# Patient Record
Sex: Male | Born: 1955
Health system: Southern US, Community
[De-identification: ages and names within clinical notes are randomized; demographics above are authoritative.]

## PROBLEM LIST (undated history)

## (undated) ENCOUNTER — Ambulatory Visit (HOSPITAL_COMMUNITY): Admission: EM | Payer: Medicare PPO | Source: Home / Self Care

## (undated) DIAGNOSIS — R011 Cardiac murmur, unspecified: Secondary | ICD-10-CM

## (undated) DIAGNOSIS — I10 Essential (primary) hypertension: Secondary | ICD-10-CM

## (undated) DIAGNOSIS — C3411 Malignant neoplasm of upper lobe, right bronchus or lung: Secondary | ICD-10-CM

## (undated) DIAGNOSIS — G894 Chronic pain syndrome: Secondary | ICD-10-CM

## (undated) DIAGNOSIS — I639 Cerebral infarction, unspecified: Secondary | ICD-10-CM

## (undated) DIAGNOSIS — J45909 Unspecified asthma, uncomplicated: Secondary | ICD-10-CM

## (undated) HISTORY — PX: BUNIONECTOMY: SHX129

## (undated) HISTORY — PX: LEG SURGERY: SHX1003

---

## 2012-10-07 ENCOUNTER — Inpatient Hospital Stay (HOSPITAL_COMMUNITY)
Admission: EM | Admit: 2012-10-07 | Discharge: 2012-10-11 | DRG: 494 | Disposition: A | Discharge status: Home / Self Care | Payer: Non-veteran care | Attending: Orthopaedic Surgery | Admitting: Orthopaedic Surgery

## 2012-10-07 ENCOUNTER — Emergency Department (HOSPITAL_COMMUNITY): Payer: Non-veteran care

## 2012-10-07 ENCOUNTER — Encounter (HOSPITAL_COMMUNITY): Payer: Self-pay | Admitting: *Deleted

## 2012-10-07 DIAGNOSIS — S42002A Fracture of unspecified part of left clavicle, initial encounter for closed fracture: Secondary | ICD-10-CM

## 2012-10-07 DIAGNOSIS — S42032A Displaced fracture of lateral end of left clavicle, initial encounter for closed fracture: Secondary | ICD-10-CM

## 2012-10-07 DIAGNOSIS — G894 Chronic pain syndrome: Secondary | ICD-10-CM | POA: Insufficient documentation

## 2012-10-07 DIAGNOSIS — S82109A Unspecified fracture of upper end of unspecified tibia, initial encounter for closed fracture: Principal | ICD-10-CM | POA: Diagnosis present

## 2012-10-07 DIAGNOSIS — Y998 Other external cause status: Secondary | ICD-10-CM

## 2012-10-07 DIAGNOSIS — S82102A Unspecified fracture of upper end of left tibia, initial encounter for closed fracture: Secondary | ICD-10-CM

## 2012-10-07 DIAGNOSIS — M79609 Pain in unspecified limb: Secondary | ICD-10-CM | POA: Diagnosis present

## 2012-10-07 DIAGNOSIS — I10 Essential (primary) hypertension: Secondary | ICD-10-CM | POA: Diagnosis present

## 2012-10-07 DIAGNOSIS — S82143A Displaced bicondylar fracture of unspecified tibia, initial encounter for closed fracture: Secondary | ICD-10-CM

## 2012-10-07 DIAGNOSIS — S42009A Fracture of unspecified part of unspecified clavicle, initial encounter for closed fracture: Secondary | ICD-10-CM | POA: Diagnosis present

## 2012-10-07 DIAGNOSIS — Y92410 Unspecified street and highway as the place of occurrence of the external cause: Secondary | ICD-10-CM

## 2012-10-07 DIAGNOSIS — S82142A Displaced bicondylar fracture of left tibia, initial encounter for closed fracture: Secondary | ICD-10-CM

## 2012-10-07 DIAGNOSIS — IMO0002 Reserved for concepts with insufficient information to code with codable children: Secondary | ICD-10-CM | POA: Diagnosis present

## 2012-10-07 DIAGNOSIS — S0081XA Abrasion of other part of head, initial encounter: Secondary | ICD-10-CM

## 2012-10-07 HISTORY — DX: Essential (primary) hypertension: I10

## 2012-10-07 HISTORY — DX: Cardiac murmur, unspecified: R01.1

## 2012-10-07 HISTORY — DX: Chronic pain syndrome: G89.4

## 2012-10-07 LAB — MRSA PCR SCREENING: MRSA by PCR: NEGATIVE
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
## 2012-10-07 MED ORDER — HYDROCODONE-ACETAMINOPHEN 5-325 MG PO TABS
2.0000 | ORAL_TABLET | Freq: Once | ORAL | Status: AC
  Administered 2012-10-07: 2 via ORAL
  Filled 2012-10-07: qty 2

## 2012-10-07 MED ORDER — BACITRACIN ZINC 500 UNIT/GM EX OINT
TOPICAL_OINTMENT | Freq: Two times a day (BID) | CUTANEOUS | Status: DC
  Administered 2012-10-08 – 2012-10-10 (×4): via TOPICAL
  Administered 2012-10-11: 1 via TOPICAL
  Filled 2012-10-07 (×2): qty 15

## 2012-10-07 MED ORDER — HYDROMORPHONE HCL PF 1 MG/ML IJ SOLN
1.0000 mg | Freq: Once | INTRAMUSCULAR | Status: AC
  Administered 2012-10-07: 1 mg via INTRAVENOUS
  Filled 2012-10-07: qty 1

## 2012-10-07 MED ORDER — OXYCODONE-ACETAMINOPHEN 5-325 MG PO TABS
2.0000 | ORAL_TABLET | ORAL | Status: DC | PRN
  Administered 2012-10-08 – 2012-10-11 (×12): 2 via ORAL
  Filled 2012-10-07 (×12): qty 2

## 2012-10-07 MED ORDER — PROMETHAZINE HCL 25 MG PO TABS
25.0000 mg | ORAL_TABLET | Freq: Four times a day (QID) | ORAL | Status: DC | PRN
  Filled 2012-10-07 (×2): qty 1

## 2012-10-07 MED ORDER — ONDANSETRON HCL 4 MG/2ML IJ SOLN
4.0000 mg | Freq: Once | INTRAMUSCULAR | Status: AC
  Administered 2012-10-07: 4 mg via INTRAVENOUS
  Filled 2012-10-07: qty 2

## 2012-10-07 MED ORDER — SODIUM CHLORIDE 0.9 % IV BOLUS (SEPSIS)
1000.0000 mL | Freq: Once | INTRAVENOUS | Status: AC
  Administered 2012-10-07: 1000 mL via INTRAVENOUS

## 2012-10-07 MED ORDER — METHOCARBAMOL 500 MG PO TABS
500.0000 mg | ORAL_TABLET | Freq: Four times a day (QID) | ORAL | Status: DC | PRN
  Administered 2012-10-10 (×2): 500 mg via ORAL
  Filled 2012-10-07 (×6): qty 1

## 2012-10-07 MED ORDER — MORPHINE SULFATE 2 MG/ML IJ SOLN
2.0000 mg | INTRAMUSCULAR | Status: DC | PRN
  Administered 2012-10-08 – 2012-10-09 (×3): 2 mg via INTRAVENOUS
  Filled 2012-10-07 (×4): qty 1
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
## 2012-10-07 NOTE — ED Notes (Signed)
Ortho tech paged for order for Knee immobilizer

## 2012-10-07 NOTE — ED Notes (Signed)
Pt denies n/v/d; pt alert and mentating appropriately; pt denies blurred vision and headache; pt denies numbness and tingling; pt denies dizziness and lightheadedness; NAD noted at this time.

## 2012-10-07 NOTE — ED Notes (Signed)
MD at bedside. 

## 2012-10-07 NOTE — ED Notes (Signed)
[employer] officer at bedside speaking with pt. 
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
## 2012-10-07 NOTE — Progress Notes (Signed)
Orthopedic Tech Progress Note Patient Details:  Randy Andersen 07/24/1956 [id number]  Ortho Devices Type of Ortho Device: Knee Immobilizer Ortho Device/Splint Location: (L) LE Ortho Device/Splint Interventions: Application   Jennye Moccasin 10/07/2012, 10:31 PM
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
## 2012-10-08 ENCOUNTER — Encounter (HOSPITAL_COMMUNITY): Payer: Self-pay | Admitting: Certified Registered Nurse Anesthetist

## 2012-10-08 ENCOUNTER — Inpatient Hospital Stay (HOSPITAL_COMMUNITY): Payer: Non-veteran care

## 2012-10-08 ENCOUNTER — Inpatient Hospital Stay (HOSPITAL_COMMUNITY): Payer: Non-veteran care | Admitting: Certified Registered Nurse Anesthetist

## 2012-10-08 ENCOUNTER — Encounter (HOSPITAL_COMMUNITY)
Admission: EM | Disposition: A | Discharge status: Home / Self Care | Payer: Self-pay | Source: Home / Self Care | Attending: Orthopaedic Surgery

## 2012-10-08 HISTORY — PX: ORIF TIBIA PLATEAU: SHX2132

## 2012-10-08 LAB — CBC
HCT: 40.6 % (ref 39.0–52.0)
Hemoglobin: 14.4 g/dL (ref 13.0–17.0)
MCH: 29.6 pg (ref 26.0–34.0)
MCHC: 35.5 g/dL (ref 30.0–36.0)
MCV: 83.4 fL (ref 78.0–100.0)
Platelets: 185 10*3/uL (ref 150–400)
RBC: 4.87 MIL/uL (ref 4.22–5.81)
RDW: 13.2 % (ref 11.5–15.5)
WBC: 6.6 10*3/uL (ref 4.0–10.5)

## 2012-10-08 LAB — URINE MICROSCOPIC-ADD ON

## 2012-10-08 LAB — URINALYSIS, ROUTINE W REFLEX MICROSCOPIC
Bilirubin Urine: NEGATIVE
Glucose, UA: NEGATIVE mg/dL
Ketones, ur: NEGATIVE mg/dL
Nitrite: NEGATIVE
Protein, ur: NEGATIVE mg/dL
Specific Gravity, Urine: 1.012 (ref 1.005–1.030)
Urobilinogen, UA: 0.2 mg/dL (ref 0.0–1.0)
pH: 7 (ref 5.0–8.0)

## 2012-10-08 LAB — APTT: aPTT: 32 seconds (ref 24–37)

## 2012-10-08 LAB — COMPREHENSIVE METABOLIC PANEL
ALT: 14 U/L (ref 0–53)
AST: 24 U/L (ref 0–37)
Albumin: 3.6 g/dL (ref 3.5–5.2)
Alkaline Phosphatase: 86 U/L (ref 39–117)
BUN: 11 mg/dL (ref 6–23)
CO2: 25 mEq/L (ref 19–32)
Calcium: 9 mg/dL (ref 8.4–10.5)
Chloride: 106 mEq/L (ref 96–112)
Creatinine, Ser: 1.14 mg/dL (ref 0.50–1.35)
GFR calc Af Amer: 81 mL/min — ABNORMAL LOW (ref >90)
GFR calc non Af Amer: 70 mL/min — ABNORMAL LOW (ref >90)
Glucose, Bld: 98 mg/dL (ref 70–99)
Potassium: 3.5 mEq/L (ref 3.5–5.1)
Sodium: 140 mEq/L (ref 135–145)
Total Bilirubin: 0.6 mg/dL (ref 0.3–1.2)
Total Protein: 6.6 g/dL (ref 6.0–8.3)

## 2012-10-08 LAB — PROTIME-INR
INR: 1.01 (ref 0.00–1.49)
Prothrombin Time: 13.2 seconds (ref 11.6–15.2)

## 2012-10-08 IMAGING — RF DG TIBIA/FIBULA 2V*L*
1 series · 3 of 3 positions shown · non-contrast
Comparison: CT radiographs dated [DATE].

CLINICAL DATA: Tibial plateau fracture.

LEFT TIBIA AND FIBULA - 2 VIEW

[Series 1: run · 3 of 3 slices shown]
[im 1/3]
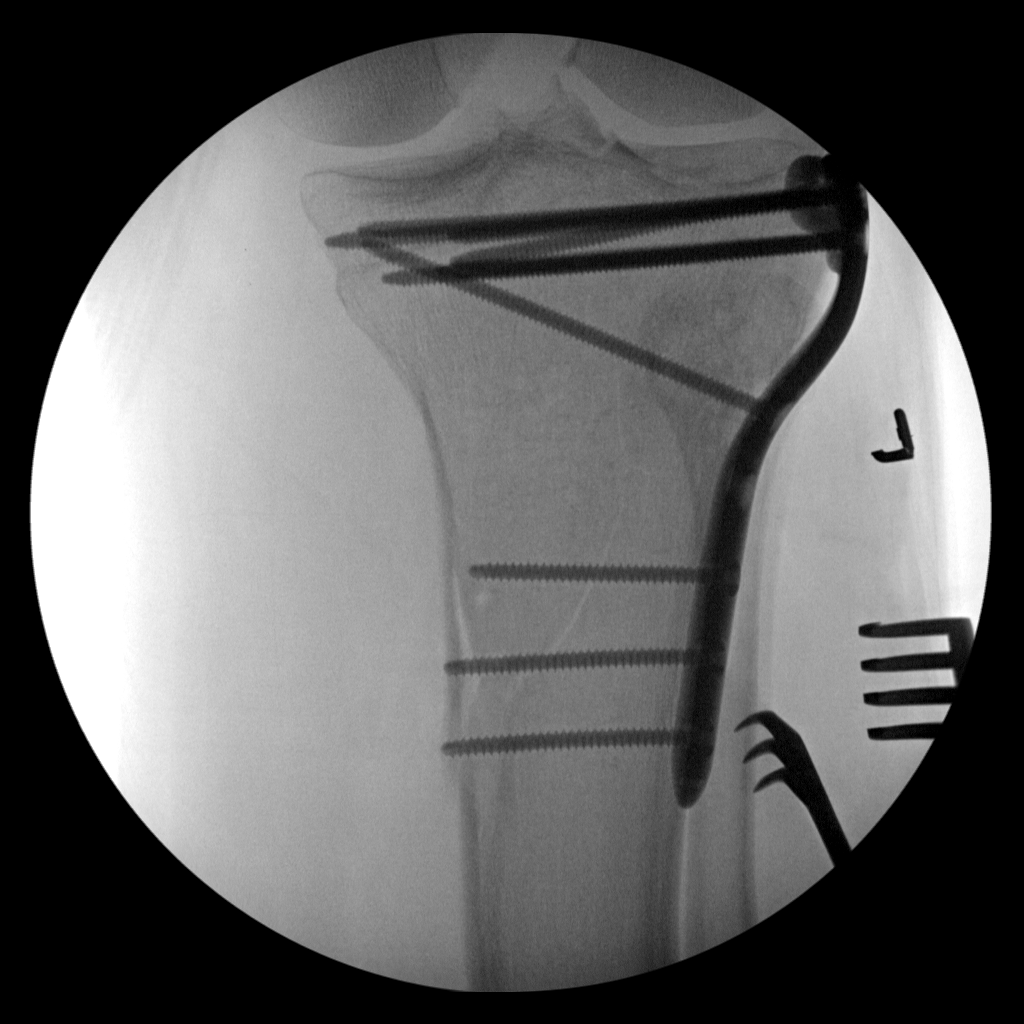
[im 2/3]
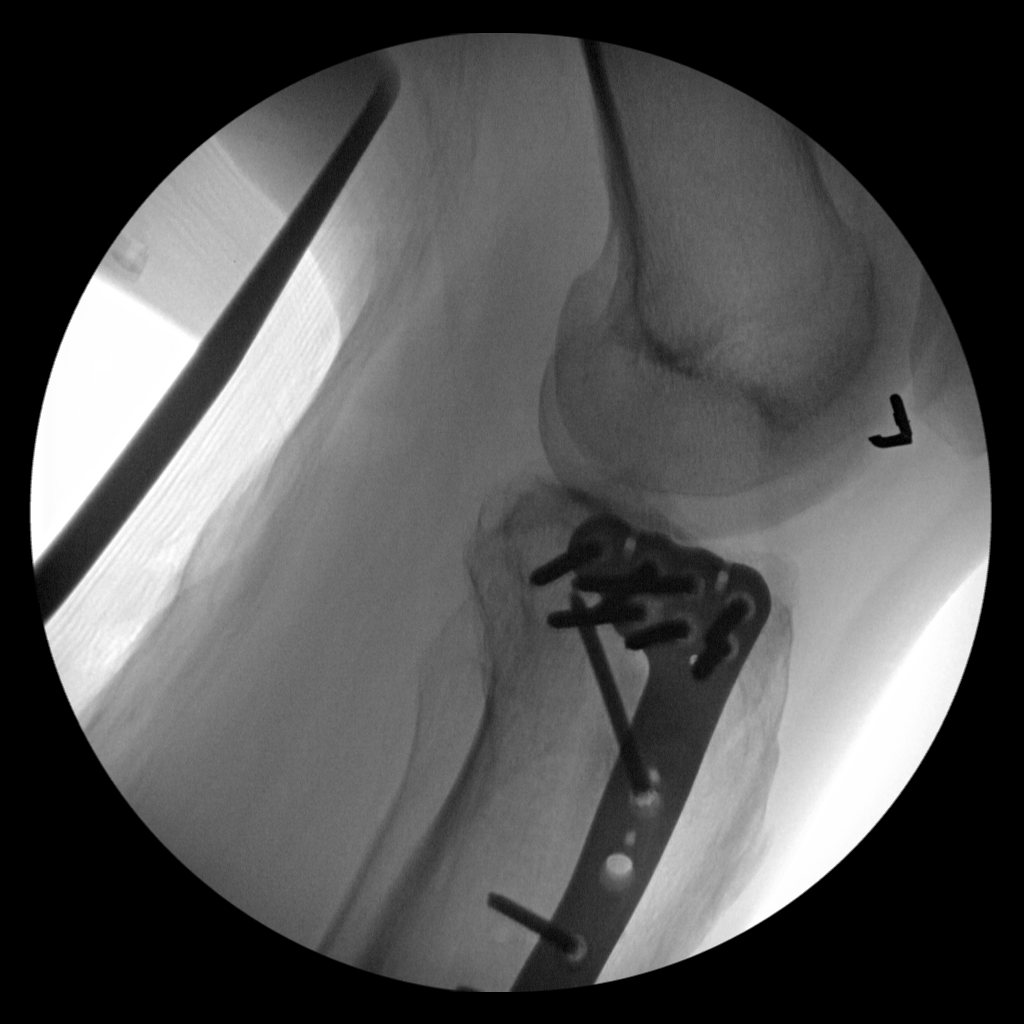
[im 3/3]
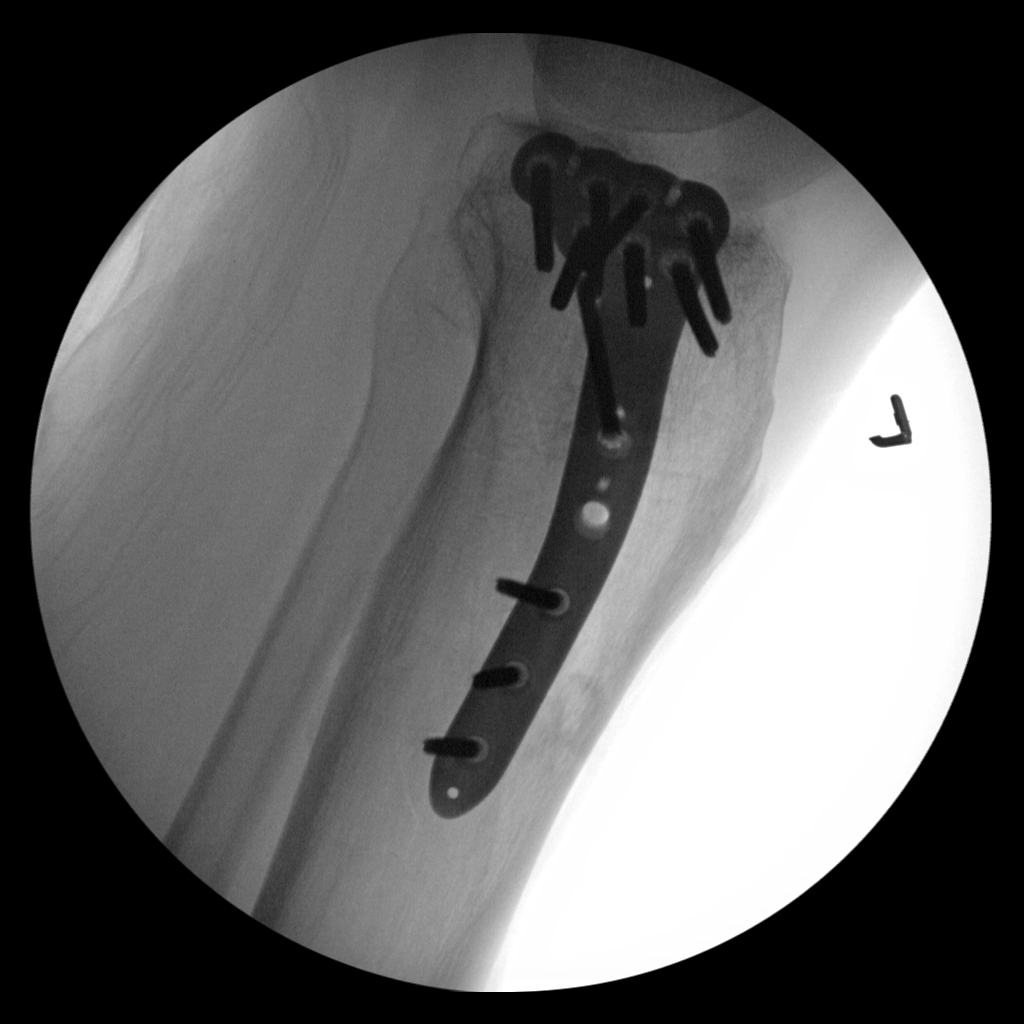

[3 of 3 positions shown; findings below may reference images not displayed]

FINDINGS: Three C-arm views of the left knee demonstrate screw and
plate fixation of the previously demonstrated comminuted proximal
tibia fracture.  Anatomic position and alignment of the major
fragments.  Mild displacement at the base of the lateral tibial
spine.
IMPRESSION: Hardware fixation of the proximal tibia fracture, as described
above.

## 2012-10-08 IMAGING — CR DG TIBIA/FIBULA PORT 2V*L*
4 series · 4 of 4 positions shown · non-contrast
Comparison: Multiple priors.

CLINICAL DATA: Postop

PORTABLE LEFT TIBIA AND FIBULA - 2 VIEW

[AP (1 of 2)]
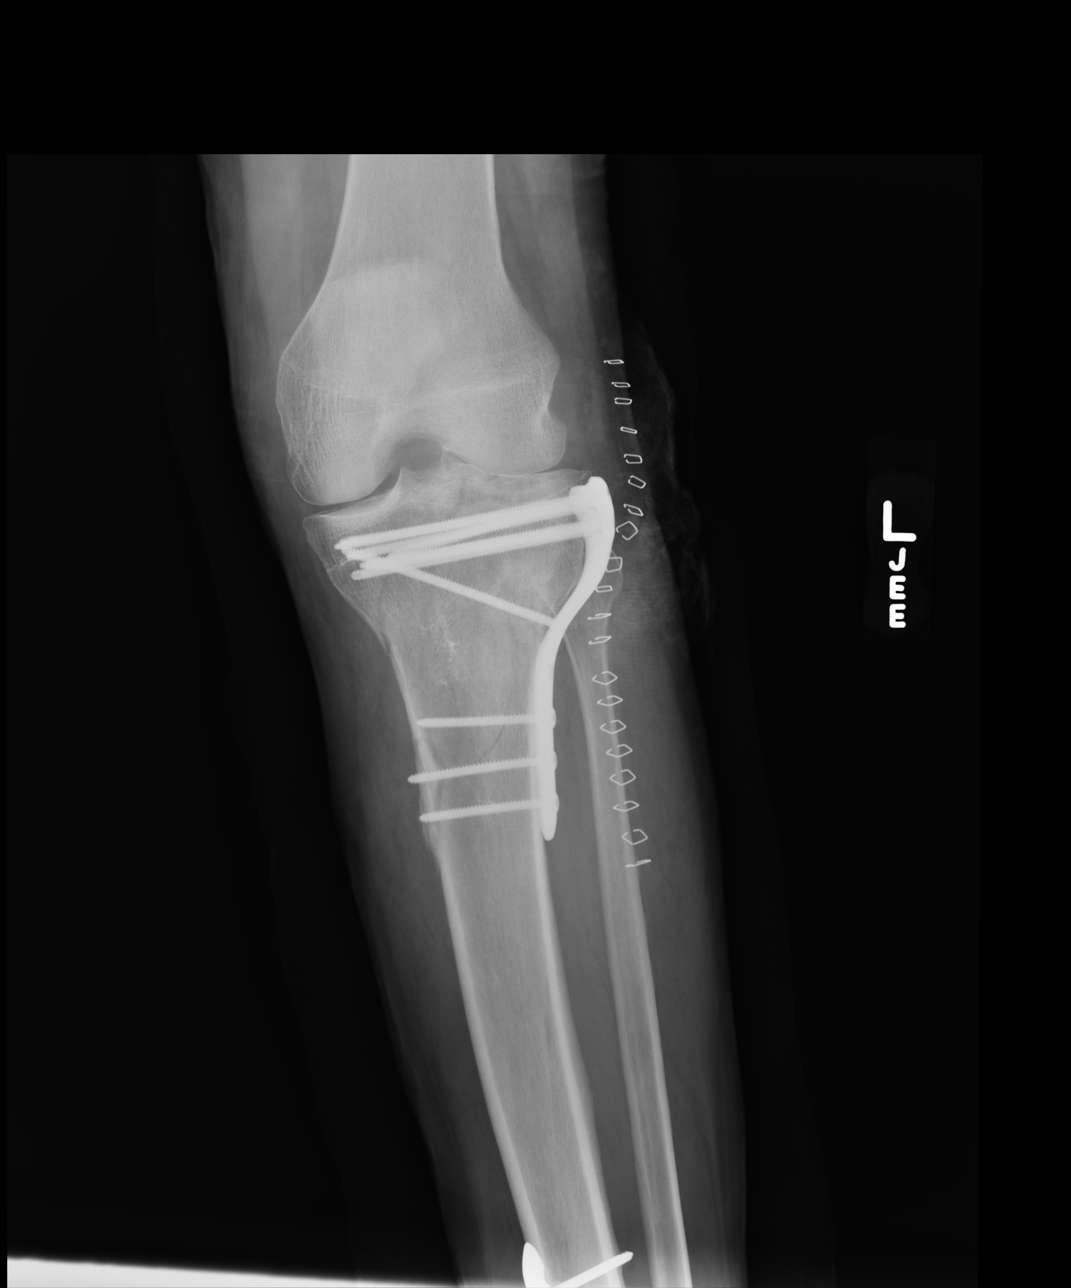

[lateral (1 of 2)]
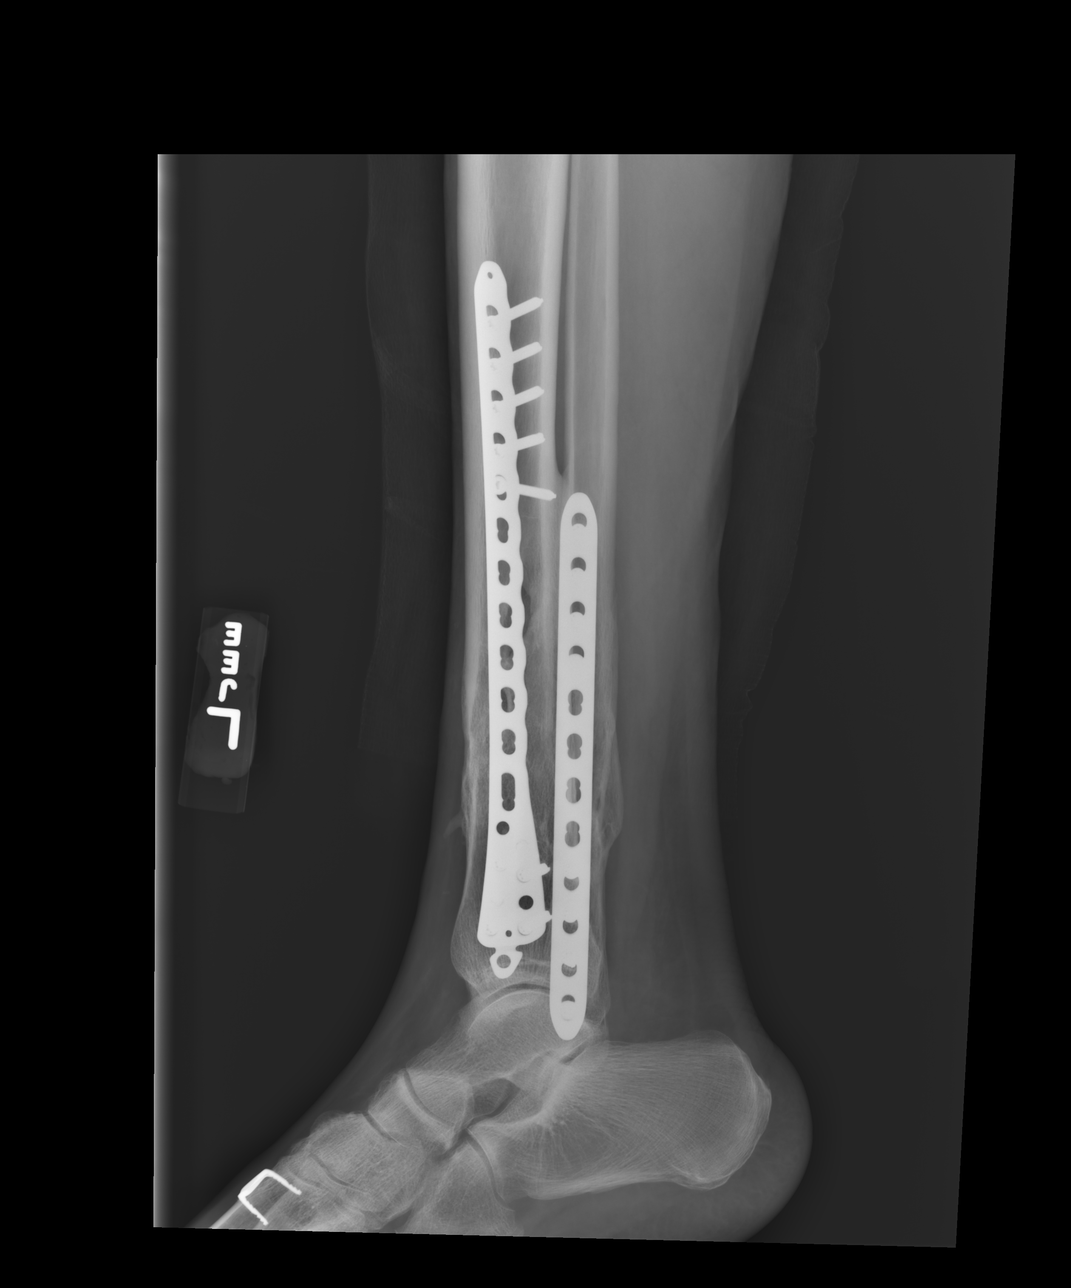

[AP (2 of 2)]
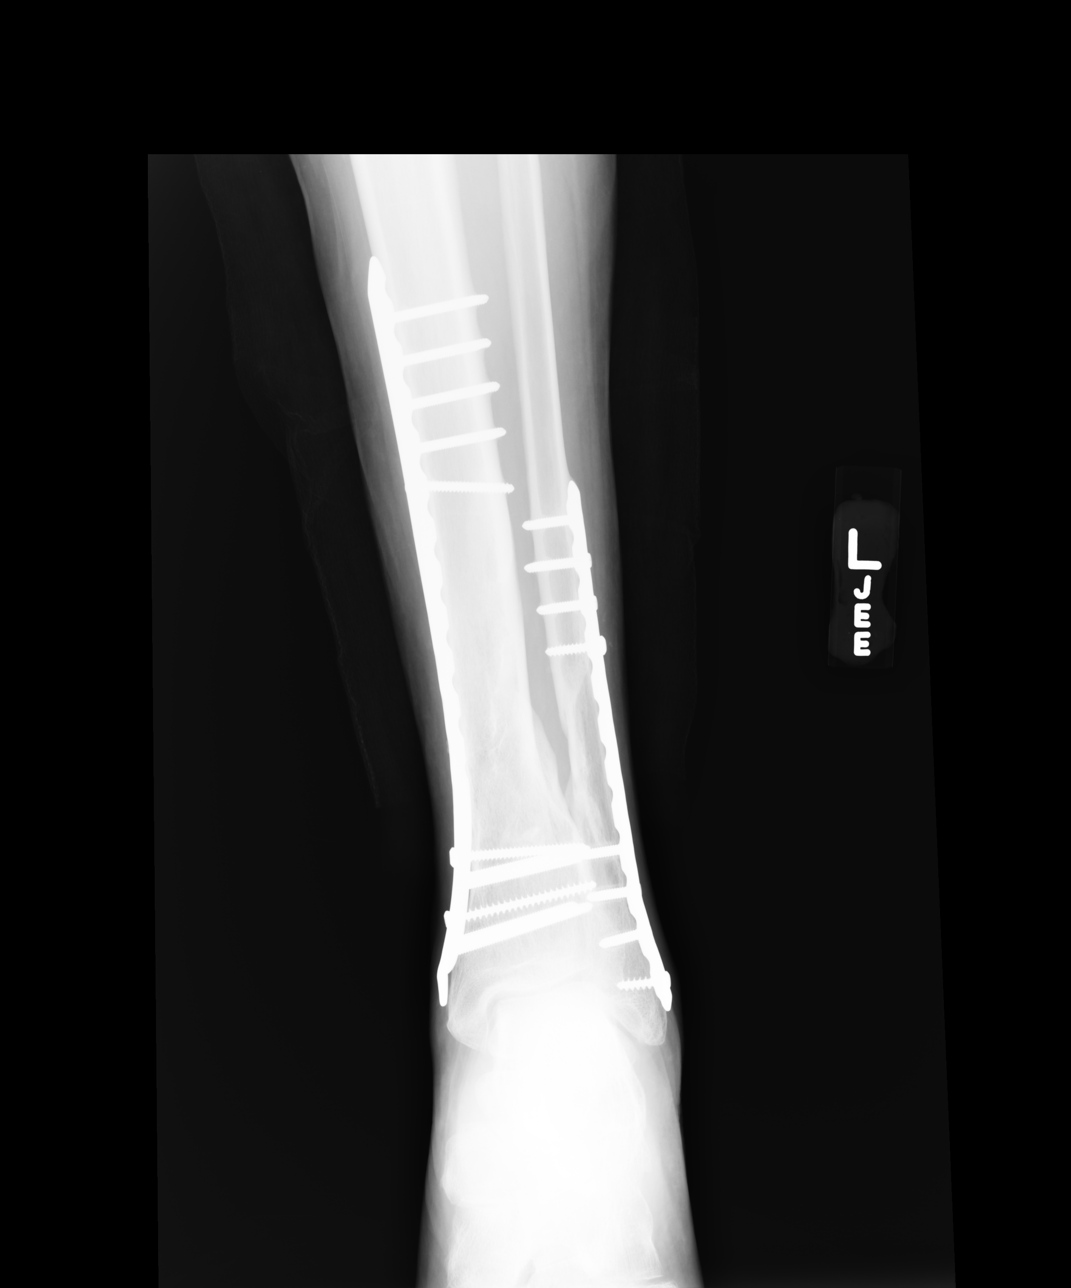

[lateral (2 of 2)]
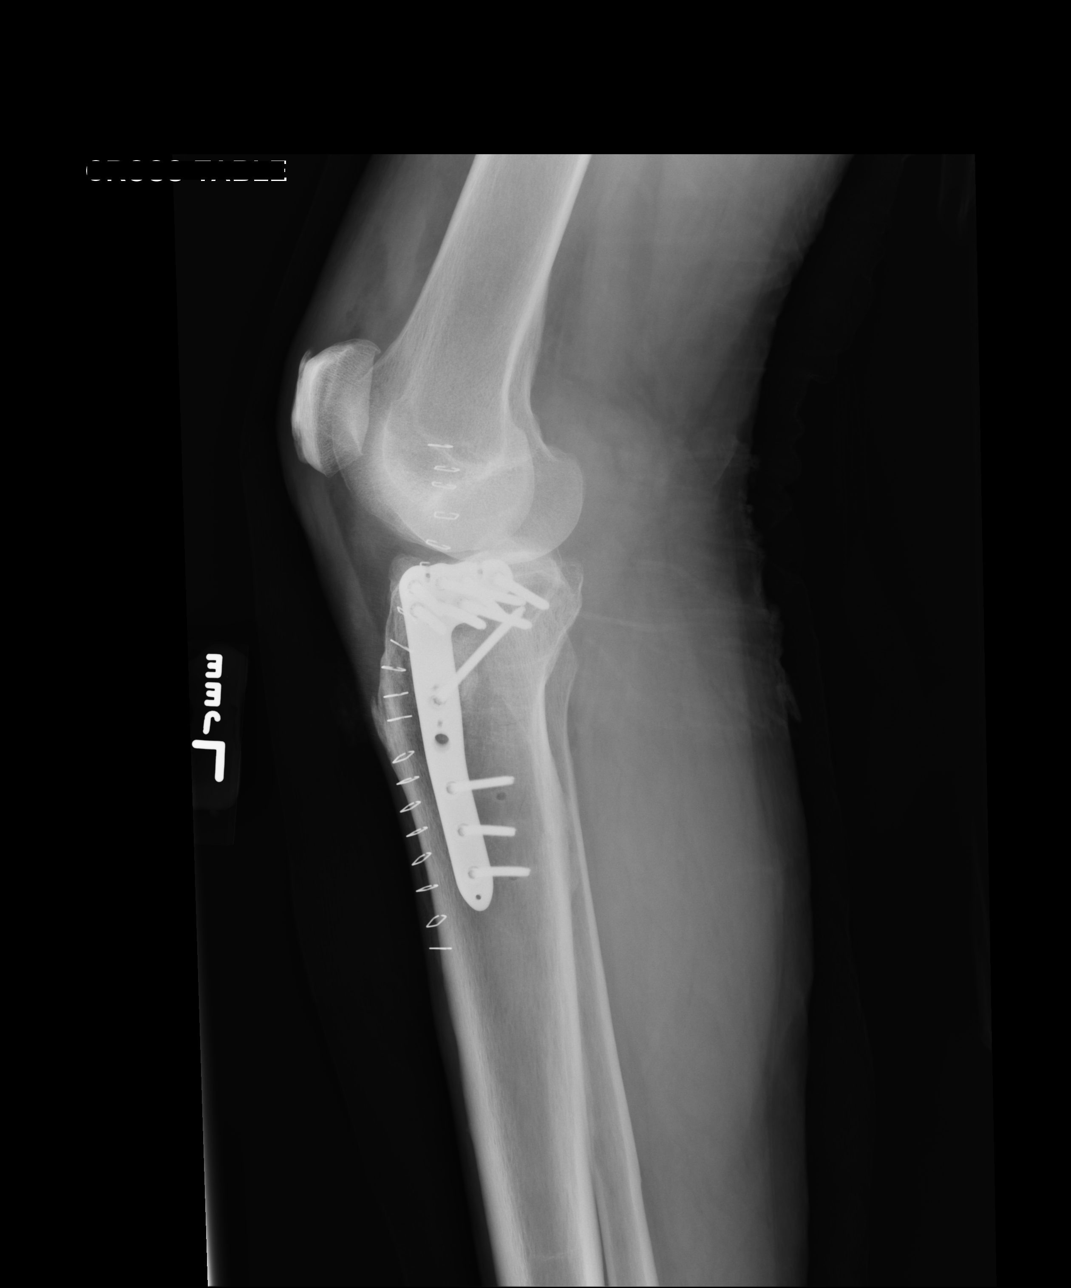

[4 of 4 positions shown; findings below may reference images not displayed]

FINDINGS: Portable AP/lateral of the tibia demonstrates open
reduction and internal fixation of lateral tibial plateau fracture.
Plate and screw fixation of lower leg tibial and fibular fractures
appear healed.
IMPRESSION: Satisfactory postoperative appearance.

## 2012-10-08 SURGERY — OPEN REDUCTION INTERNAL FIXATION (ORIF) TIBIAL PLATEAU
Anesthesia: General | Site: Leg Lower | Laterality: Left | Wound class: Clean

## 2012-10-08 MED ORDER — AMLODIPINE BESYLATE 10 MG PO TABS
10.0000 mg | ORAL_TABLET | Freq: Every day | ORAL | Status: DC
  Administered 2012-10-08 – 2012-10-11 (×4): 10 mg via ORAL
  Filled 2012-10-08 (×5): qty 1

## 2012-10-08 MED ORDER — AMLODIPINE BESYLATE 10 MG PO TABS
10.0000 mg | ORAL_TABLET | Freq: Once | ORAL | Status: AC
  Administered 2012-10-08: 10 mg via ORAL
  Filled 2012-10-08: qty 1

## 2012-10-08 MED ORDER — HYDROMORPHONE HCL PF 1 MG/ML IJ SOLN
0.2500 mg | INTRAMUSCULAR | Status: DC | PRN
  Administered 2012-10-08 (×2): 0.5 mg via INTRAVENOUS

## 2012-10-08 MED ORDER — SENNA 8.6 MG PO TABS
1.0000 | ORAL_TABLET | Freq: Two times a day (BID) | ORAL | Status: DC
  Administered 2012-10-08 – 2012-10-11 (×6): 8.6 mg via ORAL
  Filled 2012-10-08 (×8): qty 1

## 2012-10-08 MED ORDER — BUPIVACAINE HCL (PF) 0.25 % IJ SOLN
INTRAMUSCULAR | Status: DC | PRN
  Administered 2012-10-08: 20 mL

## 2012-10-08 MED ORDER — CEFAZOLIN SODIUM-DEXTROSE 2-3 GM-% IV SOLR
2.0000 g | INTRAVENOUS | Status: AC
  Administered 2012-10-08: 2 g via INTRAVENOUS
  Filled 2012-10-08: qty 50

## 2012-10-08 MED ORDER — MIDAZOLAM HCL 5 MG/5ML IJ SOLN
INTRAMUSCULAR | Status: DC | PRN
  Administered 2012-10-08: 2 mg via INTRAVENOUS

## 2012-10-08 MED ORDER — ONDANSETRON HCL 4 MG/2ML IJ SOLN: 4.0000 mg | Freq: Once | INTRAMUSCULAR | Status: DC | PRN

## 2012-10-08 MED ORDER — MEPERIDINE HCL 25 MG/ML IJ SOLN: 6.2500 mg | INTRAMUSCULAR | Status: DC | PRN

## 2012-10-08 MED ORDER — HYDROCODONE-ACETAMINOPHEN 10-325 MG PO TABS
1.0000 | ORAL_TABLET | Freq: Four times a day (QID) | ORAL | Status: DC | PRN
  Administered 2012-10-08 – 2012-10-11 (×4): 1 via ORAL
  Filled 2012-10-08 (×4): qty 1

## 2012-10-08 MED ORDER — ONDANSETRON HCL 4 MG/2ML IJ SOLN
4.0000 mg | Freq: Four times a day (QID) | INTRAMUSCULAR | Status: DC | PRN
  Filled 2012-10-08: qty 2

## 2012-10-08 MED ORDER — METHOCARBAMOL 100 MG/ML IJ SOLN
500.0000 mg | Freq: Four times a day (QID) | INTRAVENOUS | Status: DC | PRN
  Filled 2012-10-08: qty 5

## 2012-10-08 MED ORDER — LACTATED RINGERS IV SOLN
INTRAVENOUS | Status: DC | PRN
  Administered 2012-10-08 (×2): via INTRAVENOUS

## 2012-10-08 MED ORDER — WARFARIN SODIUM 10 MG PO TABS
10.0000 mg | ORAL_TABLET | Freq: Once | ORAL | Status: AC
  Administered 2012-10-08: 10 mg via ORAL
  Filled 2012-10-08: qty 1

## 2012-10-08 MED ORDER — GLYCOPYRROLATE 0.2 MG/ML IJ SOLN
INTRAMUSCULAR | Status: DC | PRN
  Administered 2012-10-08: .6 mg via INTRAVENOUS

## 2012-10-08 MED ORDER — METOCLOPRAMIDE HCL 5 MG/ML IJ SOLN: 5.0000 mg | Freq: Three times a day (TID) | INTRAMUSCULAR | Status: DC | PRN

## 2012-10-08 MED ORDER — FLEET ENEMA 7-19 GM/118ML RE ENEM: 1.0000 | ENEMA | Freq: Once | RECTAL | Status: AC | PRN

## 2012-10-08 MED ORDER — OXYCODONE HCL 5 MG/5ML PO SOLN: 5.0000 mg | Freq: Once | ORAL | Status: DC | PRN

## 2012-10-08 MED ORDER — ONDANSETRON HCL 4 MG/2ML IJ SOLN
INTRAMUSCULAR | Status: DC | PRN
  Administered 2012-10-08: 4 mg via INTRAVENOUS

## 2012-10-08 MED ORDER — HYDROMORPHONE HCL PF 1 MG/ML IJ SOLN
0.5000 mg | INTRAMUSCULAR | Status: DC | PRN
  Administered 2012-10-09 – 2012-10-10 (×8): 1 mg via INTRAVENOUS
  Filled 2012-10-08 (×8): qty 1

## 2012-10-08 MED ORDER — OXYCODONE HCL 5 MG PO TABS: 5.0000 mg | ORAL_TABLET | Freq: Once | ORAL | Status: DC | PRN

## 2012-10-08 MED ORDER — FENTANYL CITRATE 0.05 MG/ML IJ SOLN
INTRAMUSCULAR | Status: DC | PRN
  Administered 2012-10-08: 100 ug via INTRAVENOUS
  Administered 2012-10-08 (×5): 50 ug via INTRAVENOUS
  Administered 2012-10-08: 100 ug via INTRAVENOUS
  Administered 2012-10-08: 50 ug via INTRAVENOUS

## 2012-10-08 MED ORDER — METHOCARBAMOL 500 MG PO TABS
500.0000 mg | ORAL_TABLET | Freq: Four times a day (QID) | ORAL | Status: DC | PRN
  Administered 2012-10-08 – 2012-10-11 (×6): 500 mg via ORAL
  Filled 2012-10-08 (×2): qty 1

## 2012-10-08 MED ORDER — CHLORHEXIDINE GLUCONATE 4 % EX LIQD
60.0000 mL | Freq: Once | CUTANEOUS | Status: AC
  Administered 2012-10-08: 4 via TOPICAL
  Filled 2012-10-08: qty 60

## 2012-10-08 MED ORDER — ROCURONIUM BROMIDE 100 MG/10ML IV SOLN
INTRAVENOUS | Status: DC | PRN
  Administered 2012-10-08: 50 mg via INTRAVENOUS

## 2012-10-08 MED ORDER — METOCLOPRAMIDE HCL 10 MG PO TABS: 5.0000 mg | ORAL_TABLET | Freq: Three times a day (TID) | ORAL | Status: DC | PRN

## 2012-10-08 MED ORDER — NEOSTIGMINE METHYLSULFATE 1 MG/ML IJ SOLN
INTRAMUSCULAR | Status: DC | PRN
  Administered 2012-10-08: 4 mg via INTRAVENOUS

## 2012-10-08 MED ORDER — WARFARIN - PHARMACIST DOSING INPATIENT: Freq: Every day | Status: DC

## 2012-10-08 MED ORDER — DEXTROSE IN LACTATED RINGERS 5 % IV SOLN
INTRAVENOUS | Status: DC
  Administered 2012-10-08 – 2012-10-10 (×3): via INTRAVENOUS

## 2012-10-08 MED ORDER — KCL IN DEXTROSE-NACL 20-5-0.9 MEQ/L-%-% IV SOLN
INTRAVENOUS | Status: DC
  Administered 2012-10-08: 07:00:00 via INTRAVENOUS
  Filled 2012-10-08 (×3): qty 1000

## 2012-10-08 MED ORDER — COUMADIN BOOK
Freq: Once | Status: AC
  Administered 2012-10-08: 22:00:00
  Filled 2012-10-08: qty 1

## 2012-10-08 MED ORDER — ONDANSETRON HCL 4 MG PO TABS: 4.0000 mg | ORAL_TABLET | Freq: Four times a day (QID) | ORAL | Status: DC | PRN

## 2012-10-08 MED ORDER — PROPOFOL 10 MG/ML IV BOLUS
INTRAVENOUS | Status: DC | PRN
  Administered 2012-10-08: 120 mg via INTRAVENOUS

## 2012-10-08 MED ORDER — LIDOCAINE HCL (CARDIAC) 20 MG/ML IV SOLN
INTRAVENOUS | Status: DC | PRN
  Administered 2012-10-08: 100 mg via INTRAVENOUS

## 2012-10-08 MED ORDER — WARFARIN VIDEO: Freq: Once | Status: DC

## 2012-10-08 SURGICAL SUPPLY — 81 items
5 Hole Proximal Tibia Plate- Large Curve ×2 IMPLANT
BANDAGE ELASTIC 4 VELCRO ST LF (GAUZE/BANDAGES/DRESSINGS) IMPLANT
BANDAGE ELASTIC 6 VELCRO ST LF (GAUZE/BANDAGES/DRESSINGS) IMPLANT
BANDAGE GAUZE ELAST BULKY 4 IN (GAUZE/BANDAGES/DRESSINGS) IMPLANT
BIT DRILL 2.5X2.75 QC CALB (BIT) ×2 IMPLANT
BIT DRILL CAL (BIT) ×1 IMPLANT
BIT DRILL SLEEVE MEASURING (BIT) ×1 IMPLANT
BLADE SURG 10 STRL SS (BLADE) ×2 IMPLANT
BLADE SURG ROTATE 9660 (MISCELLANEOUS) IMPLANT
BNDG COHESIVE 4X5 TAN STRL (GAUZE/BANDAGES/DRESSINGS) IMPLANT
BNDG COHESIVE 6X5 TAN STRL LF (GAUZE/BANDAGES/DRESSINGS) ×2 IMPLANT
BONE CHIP PRESERV 20CC (Bone Implant) ×2 IMPLANT
CLEANER TIP ELECTROSURG 2X2 (MISCELLANEOUS) ×2 IMPLANT
CLOTH BEACON ORANGE TIMEOUT ST (SAFETY) ×2 IMPLANT
COVER MAYO STAND STRL (DRAPES) ×2 IMPLANT
COVER SURGICAL LIGHT HANDLE (MISCELLANEOUS) ×2 IMPLANT
CUFF TOURNIQUET SINGLE 34IN LL (TOURNIQUET CUFF) ×2 IMPLANT
CUFF TOURNIQUET SINGLE 44IN (TOURNIQUET CUFF) IMPLANT
DRAPE C-ARM 42X72 X-RAY (DRAPES) IMPLANT
DRAPE C-ARMOR (DRAPES) ×2 IMPLANT
DRAPE INCISE IOBAN 66X45 STRL (DRAPES) ×2 IMPLANT
DRAPE U-SHAPE 47X51 STRL (DRAPES) ×2 IMPLANT
DRILL BIT CAL (BIT) ×2
DRILL SLEEVE MEASURING (BIT) ×2
DRSG ADAPTIC 3X8 NADH LF (GAUZE/BANDAGES/DRESSINGS) ×2 IMPLANT
DRSG PAD ABDOMINAL 8X10 ST (GAUZE/BANDAGES/DRESSINGS) IMPLANT
DURAPREP 26ML APPLICATOR (WOUND CARE) ×2 IMPLANT
ELECT REM PT RETURN 9FT ADLT (ELECTROSURGICAL) ×2
ELECTRODE REM PT RTRN 9FT ADLT (ELECTROSURGICAL) ×1 IMPLANT
GLOVE BIOGEL PI IND STRL 7.5 (GLOVE) ×1 IMPLANT
GLOVE BIOGEL PI IND STRL 8 (GLOVE) ×1 IMPLANT
GLOVE BIOGEL PI INDICATOR 7.5 (GLOVE) ×1
GLOVE BIOGEL PI INDICATOR 8 (GLOVE) ×1
GLOVE ECLIPSE 7.0 STRL STRAW (GLOVE) ×2 IMPLANT
GLOVE ORTHO TXT STRL SZ7.5 (GLOVE) ×2 IMPLANT
GLOVE SS BIOGEL STRL SZ 7.5 (GLOVE) ×1 IMPLANT
GLOVE SUPERSENSE BIOGEL SZ 7.5 (GLOVE) ×1
GOWN PREVENTION PLUS LG XLONG (DISPOSABLE) ×4 IMPLANT
GOWN PREVENTION PLUS XLARGE (GOWN DISPOSABLE) ×2 IMPLANT
GOWN STRL NON-REIN LRG LVL3 (GOWN DISPOSABLE) ×4 IMPLANT
IMMOBILIZER KNEE 24 THIGH 36 (MISCELLANEOUS) ×1 IMPLANT
IMMOBILIZER KNEE 24 UNIV (MISCELLANEOUS) ×2
K-WIRE ACE 1.6X6 (WIRE) ×4
KIT BASIN OR (CUSTOM PROCEDURE TRAY) ×2 IMPLANT
KIT ROOM TURNOVER OR (KITS) ×2 IMPLANT
KWIRE ACE 1.6X6 (WIRE) ×2 IMPLANT
MANIFOLD NEPTUNE II (INSTRUMENTS) ×2 IMPLANT
NEEDLE HYPO 25X1 1.5 SAFETY (NEEDLE) ×2 IMPLANT
NS IRRIG 1000ML POUR BTL (IV SOLUTION) ×2 IMPLANT
PACK ORTHO EXTREMITY (CUSTOM PROCEDURE TRAY) ×2 IMPLANT
PAD ARMBOARD 7.5X6 YLW CONV (MISCELLANEOUS) ×4 IMPLANT
PAD CAST 4YDX4 CTTN HI CHSV (CAST SUPPLIES) ×1 IMPLANT
PADDING CAST COTTON 4X4 STRL (CAST SUPPLIES) ×1
PADDING CAST COTTON 6X4 STRL (CAST SUPPLIES) IMPLANT
SCREW CORTICAL 3.5MM  44MM (Screw) ×2 IMPLANT
SCREW CORTICAL 3.5MM  46MM (Screw) ×1 IMPLANT
SCREW CORTICAL 3.5MM 44MM (Screw) ×2 IMPLANT
SCREW CORTICAL 3.5MM 46MM (Screw) ×1 IMPLANT
SCREW CORTICAL 3.5MM 70MM (Screw) ×2 IMPLANT
SCREW LOCK CORT STAR 3.5X65 (Screw) ×2 IMPLANT
SCREW LOCK CORT STAR 3.5X70 (Screw) ×8 IMPLANT
SCREW LOCK CORT STAR 3.5X75 (Screw) ×2 IMPLANT
SCREW LOCK CORT STAR 3.5X80 (Screw) ×4 IMPLANT
SPONGE GAUZE 4X4 12PLY (GAUZE/BANDAGES/DRESSINGS) ×4 IMPLANT
SPONGE LAP 18X18 X RAY DECT (DISPOSABLE) ×2 IMPLANT
STAPLER VISISTAT 35W (STAPLE) IMPLANT
STOCKINETTE IMPERVIOUS LG (DRAPES) ×2 IMPLANT
SUCTION FRAZIER TIP 10 FR DISP (SUCTIONS) ×2 IMPLANT
SUT ETHIBOND 2 0 V5 (SUTURE) IMPLANT
SUT VIC AB 0 CT1 27 (SUTURE) ×2
SUT VIC AB 0 CT1 27XBRD ANBCTR (SUTURE) ×2 IMPLANT
SUT VIC AB 1 CT1 27 (SUTURE) ×1
SUT VIC AB 1 CT1 27XBRD ANBCTR (SUTURE) ×1 IMPLANT
SUT VIC AB 2-0 CT1 27 (SUTURE)
SUT VIC AB 2-0 CT1 TAPERPNT 27 (SUTURE) IMPLANT
SYR CONTROL 10ML LL (SYRINGE) ×2 IMPLANT
TOWEL OR 17X24 6PK STRL BLUE (TOWEL DISPOSABLE) ×2 IMPLANT
TOWEL OR 17X26 10 PK STRL BLUE (TOWEL DISPOSABLE) ×2 IMPLANT
TUBE CONNECTING 12X1/4 (SUCTIONS) ×2 IMPLANT
WATER STERILE IRR 1000ML POUR (IV SOLUTION) IMPLANT
YANKAUER SUCT BULB TIP NO VENT (SUCTIONS) ×2 IMPLANT

## 2012-10-08 NOTE — Interval H&P Note (Signed)
History and Physical Interval Note:  10/08/2012 5:23 PM  Randy Andersen  has presented today for surgery, with the diagnosis of left medial/lateral tibial plateau fracture  The various methods of treatment have been discussed with the patient and family. After consideration of risks, benefits and other options for treatment, the patient has consented to  Procedure(s): OPEN REDUCTION INTERNAL FIXATION (ORIF) TIBIAL PLATEAU (Left) as a surgical intervention .  The patient's history has been reviewed, patient examined, no change in status, stable for surgery.  I have reviewed the patient's chart and labs.  Questions were answered to the patient's satisfaction.     YATES,MARK C

## 2012-10-08 NOTE — Preoperative (Signed)
Beta Blockers   Reason not to administer Beta Blockers:Not Applicable 

## 2012-10-08 NOTE — Anesthesia Postprocedure Evaluation (Signed)
Anesthesia Post Note  Patient: Randy Andersen  Procedure(s) Performed: Procedure(s) (LRB): OPEN REDUCTION INTERNAL FIXATION (ORIF) TIBIAL PLATEAU (Left)  Anesthesia type: general  Patient location: PACU  Post pain: Pain level controlled  Post assessment: Patient's Cardiovascular Status Stable  Last Vitals:  Filed Vitals:   10/08/12 2000  BP: 169/88  Pulse: 84  Temp: 37.1 C  Resp: 17    Post vital signs: Reviewed and stable  Level of consciousness: sedated  Complications: No apparent anesthesia complications

## 2012-10-08 NOTE — Anesthesia Procedure Notes (Signed)
Procedure Name: Intubation Date/Time: 10/08/2012 5:36 PM Performed by: Arlice Colt B Pre-anesthesia Checklist: Patient identified, Emergency Drugs available, Suction available, Patient being monitored and Timeout performed Patient Re-evaluated:Patient Re-evaluated prior to inductionOxygen Delivery Method: Circle system utilized Preoxygenation: Pre-oxygenation with 100% oxygen Intubation Type: IV induction Ventilation: Mask ventilation without difficulty Laryngoscope Size: Mac and 4 Grade View: Grade I Tube type: Oral Tube size: 7.5 mm Number of attempts: 1 Airway Equipment and Method: Stylet Placement Confirmation: ETT inserted through vocal cords under direct vision,  positive ETCO2 and breath sounds checked- equal and bilateral Secured at: 23 cm Tube secured with: Tape Dental Injury: Teeth and Oropharynx as per pre-operative assessment

## 2012-10-08 NOTE — Brief Op Note (Addendum)
10/07/2012 - 10/08/2012  7:04 PM  PATIENT:  Randy Andersen  57 y.o. male  PRE-OPERATIVE DIAGNOSIS:  left medial/lateral tibial plateau fracture  POST-OPERATIVE DIAGNOSIS:  left medial/lateral tibial plateau fracture  PROCEDURE:  Procedure(s): OPEN REDUCTION INTERNAL FIXATION (ORIF) TIBIAL PLATEAU (Left) calcellous bone graft  SURGEON:  Surgeon(s) and Role:    * Eldred Manges, MD - Primary  PHYSICIAN ASSISTANT:   ASSISTANTS: none   ANESTHESIA:   local and general  EBL:     BLOOD ADMINISTERED:none  DRAINS: none   LOCAL MEDICATIONS USED:  MARCAINE     SPECIMEN:  No Specimen  DISPOSITION OF SPECIMEN:  N/A  COUNTS:  YES  TOURNIQUET:  * Missing tourniquet times found for documented tourniquets in log:  [id number] *  DICTATION: .Other Dictation: Dictation Number [id number]  PLAN OF CARE: still inpatient  PATIENT DISPOSITION:  PACU - hemodynamically stable.   Delay start of Pharmacological VTE agent (>24hrs) due to surgical blood loss or risk of bleeding: yes

## 2012-10-08 NOTE — Anesthesia Preprocedure Evaluation (Addendum)
Anesthesia Evaluation  Patient identified by MRN, date of birth, ID band Patient awake    Reviewed: Allergy & Precautions, H&P , NPO status , Patient's Chart, lab work & pertinent test results  History of Anesthesia Complications Negative for: history of anesthetic complications  Airway Mallampati: I TM Distance: >3 FB Neck ROM: Full    Dental  (+) Teeth Intact and Dental Advisory Given   Pulmonary          Cardiovascular hypertension, Pt. on medications Rhythm:Regular Rate:Normal     Neuro/Psych    GI/Hepatic   Endo/Other    Renal/GU      Musculoskeletal   Abdominal   Peds  Hematology   Anesthesia Other Findings   Reproductive/Obstetrics                         Anesthesia Physical Anesthesia Plan  ASA: II  Anesthesia Plan: General   Post-op Pain Management:    Induction: Intravenous  Airway Management Planned: Oral ETT  Additional Equipment:   Intra-op Plan:   Post-operative Plan: Extubation in OR  Informed Consent: I have reviewed the patients History and Physical, chart, labs and discussed the procedure including the risks, benefits and alternatives for the proposed anesthesia with the patient or authorized representative who has indicated his/her understanding and acceptance.     Plan Discussed with: Surgeon and CRNA  Anesthesia Plan Comments:        Anesthesia Quick Evaluation
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
## 2012-10-08 NOTE — Progress Notes (Signed)
UR COMPLETED  

## 2012-10-08 NOTE — Transfer of Care (Signed)
Immediate Anesthesia Transfer of Care Note  Patient: Randy Andersen  Procedure(s) Performed: Procedure(s): OPEN REDUCTION INTERNAL FIXATION (ORIF) TIBIAL PLATEAU (Left)  Patient Location: PACU  Anesthesia Type:General  Level of Consciousness: awake, alert , oriented and patient cooperative  Airway & Oxygen Therapy: Patient Spontanous Breathing and Patient connected to nasal cannula oxygen  Post-op Assessment: Report given to PACU RN, Post -op Vital signs reviewed and stable and Patient moving all extremities X 4  Post vital signs: Reviewed and stable  Complications: No apparent anesthesia complications

## 2012-10-08 NOTE — ED Notes (Signed)
Consent signed by RN and patient in chart. Transported patient to floor with consents.

## 2012-10-09 HISTORY — PX: ORIF TIBIA PLATEAU: SHX2132

## 2012-10-09 LAB — BASIC METABOLIC PANEL
BUN: 10 mg/dL (ref 6–23)
CO2: 27 mEq/L (ref 19–32)
Calcium: 9.1 mg/dL (ref 8.4–10.5)
Chloride: 101 mEq/L (ref 96–112)
Creatinine, Ser: 1.16 mg/dL (ref 0.50–1.35)
GFR calc Af Amer: 79 mL/min — ABNORMAL LOW (ref >90)
GFR calc non Af Amer: 68 mL/min — ABNORMAL LOW (ref >90)
Glucose, Bld: 125 mg/dL — ABNORMAL HIGH (ref 70–99)
Potassium: 3.7 mEq/L (ref 3.5–5.1)
Sodium: 137 mEq/L (ref 135–145)

## 2012-10-09 LAB — PROTIME-INR
INR: 1.04 (ref 0.00–1.49)
Prothrombin Time: 13.5 seconds (ref 11.6–15.2)

## 2012-10-09 MED ORDER — WARFARIN SODIUM 10 MG PO TABS
10.0000 mg | ORAL_TABLET | Freq: Once | ORAL | Status: AC
  Administered 2012-10-09: 10 mg via ORAL
  Filled 2012-10-09: qty 1
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
## 2012-10-09 NOTE — Progress Notes (Signed)
Subjective: 1 Day Post-Op Procedure(s) (LRB): OPEN REDUCTION INTERNAL FIXATION (ORIF) TIBIAL PLATEAU (Left) Patient reports pain as moderate.  Better after IV med given this am.  Percocet didn't help thru the night.  Objective: Vital signs in last 24 hours: Temp:  [98.3 F (36.8 C)-100 F (37.8 C)] 100 F (37.8 C) (03/19 0547) Pulse Rate:  [67-99] 84 (03/19 0547) Resp:  [8-18] 18 (03/19 0547) BP: (145-203)/(83-109) 160/86 mmHg (03/19 0547) SpO2:  [95 %-100 %] 100 % (03/19 0547)  Intake/Output from previous day: 03/18 0701 - 03/19 0700 In: 1265 [I.V.:1265] Out: 825 [Urine:800; Blood:25] Intake/Output this shift:     Recent Labs  10/08/12 0515  HGB 14.4    Recent Labs  10/08/12 0515  WBC 6.6  RBC 4.87  HCT 40.6  PLT 185    Recent Labs  10/08/12 0515 10/09/12 0445  NA 140 137  K 3.5 3.7  CL 106 101  CO2 25 27  BUN 11 10  CREATININE 1.14 1.16  GLUCOSE 98 125*  CALCIUM 9.0 9.1    Recent Labs  10/08/12 0515 10/09/12 0445  INR 1.01 1.04   Left LE: Neurovascular intact Dorsiflexion/Plantar flexion intact Incision: dressing C/D/I Compartment soft Sling adjusted to the left arm, min edema at clavicle fx. Assessment/Plan: 1 Day Post-Op Procedure(s) (LRB): OPEN REDUCTION INTERNAL FIXATION (ORIF) TIBIAL PLATEAU (Left) Up with therapy today.  Disposition discussed.  Pt lives alone.  Will see how he does with PT and decide about need for inpt vs home recovery.   Sentoria Brent M 10/09/2012, 8:41 AM
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
## 2012-10-10 ENCOUNTER — Encounter (HOSPITAL_COMMUNITY): Payer: Self-pay | Admitting: General Practice

## 2012-10-10 LAB — BASIC METABOLIC PANEL
BUN: 12 mg/dL (ref 6–23)
CO2: 26 mEq/L (ref 19–32)
Calcium: 9.2 mg/dL (ref 8.4–10.5)
Chloride: 102 mEq/L (ref 96–112)
Creatinine, Ser: 1.23 mg/dL (ref 0.50–1.35)
GFR calc Af Amer: 74 mL/min — ABNORMAL LOW (ref >90)
GFR calc non Af Amer: 64 mL/min — ABNORMAL LOW (ref >90)
Glucose, Bld: 130 mg/dL — ABNORMAL HIGH (ref 70–99)
Potassium: 3.9 mEq/L (ref 3.5–5.1)
Sodium: 138 mEq/L (ref 135–145)

## 2012-10-10 LAB — HEMOGLOBIN AND HEMATOCRIT, BLOOD
HCT: 38.9 % — ABNORMAL LOW (ref 39.0–52.0)
Hemoglobin: 13.8 g/dL (ref 13.0–17.0)

## 2012-10-10 LAB — PROTIME-INR
INR: 1.51 — ABNORMAL HIGH (ref 0.00–1.49)
Prothrombin Time: 17.8 seconds — ABNORMAL HIGH (ref 11.6–15.2)

## 2012-10-10 MED ORDER — WARFARIN SODIUM 5 MG PO TABS
5.0000 mg | ORAL_TABLET | Freq: Once | ORAL | Status: AC
  Administered 2012-10-10: 5 mg via ORAL
  Filled 2012-10-10: qty 1
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
## 2012-10-10 NOTE — Progress Notes (Signed)
Occupational Therapy Treatment Patient Details Name: Randy Andersen MRN: [id number] DOB: 1956/04/24 Today's Date: 10/10/2012 Time: 4401-0272 OT Time Calculation (min): 31 min  OT Assessment / Plan / Recommendation Comments on Treatment Session Pt demonstrates good safety awareness during functional transfers.  Pt. will benefit from practice with adaptive equipment.  Pt may benefit from tub transfer bench dependent on pt being able to get Lt. LE wet during shower    Follow Up Recommendations  Home health OT    Barriers to Discharge       Equipment Recommendations  3 in 1 bedside comode;Wheelchair (measurements OT);Wheelchair cushion (measurements OT);Tub/shower bench    Recommendations for Other Services    Frequency Min 2X/week   Plan Discharge plan remains appropriate    Precautions / Restrictions Precautions Precautions: Fall Required Braces or Orthoses: Knee Immobilizer - Left Restrictions Weight Bearing Restrictions: Yes LUE Weight Bearing: Weight bearing as tolerated (but sling on Lt. UE) LLE Weight Bearing: Touchdown weight bearing Other Position/Activity Restrictions: L sling  - clavicle fracture   Pertinent Vitals/Pain     ADL  Toilet Transfer: Min Pension scheme manager Method: Surveyor, minerals: Materials engineer and Hygiene: Minimal assistance Where Assessed - Engineer, mining and Hygiene: Standing Transfers/Ambulation Related to ADLs: initially requires min guard assist for transfer, but progressed to supervision ADL Comments: Pt. demonstrates good safety awareness.  Able to set up wheelchair for transfer.  Discussed pt staying on second floor of townhoues, possibilities for bathing, and LB ADLs - may benefit from AE    OT Diagnosis:    OT Problem List:   OT Treatment Interventions:     OT Goals Acute Rehab OT Goals OT Goal Formulation: With patient Time For Goal Achievement:  10/23/12 Potential to Achieve Goals: Good ADL Goals ADL Goal: Toilet Transfer - Progress: Progressing toward goals ADL Goal: Toileting - Clothing Manipulation - Progress: Progressing toward goals Pt Will Perform Tub/Shower Transfer: with supervision;Transfer tub bench;Stand pivot transfer  Visit Information  Last OT Received On: 10/10/12 Assistance Needed: +1    Subjective Data      Prior Functioning       Cognition  Cognition Overall Cognitive Status: Appears within functional limits for tasks assessed/performed Arousal/Alertness: Awake/alert Orientation Level: Appears intact for tasks assessed Behavior During Session: Randy Andersen for tasks performed    Mobility  Bed Mobility Supine to Sit: 5: Supervision Sitting - Scoot to Edge of Bed: 5: Supervision Details for Bed Mobility Assistance: Patient able to position LLE with increased time and cues for technique with positioning Transfers Transfers: Sit to Stand;Stand to Sit Sit to Stand: 5: Supervision;With upper extremity assist;From chair/3-in-1;From bed (Rt UE assist) Stand to Sit: 5: Supervision;With upper extremity assist;To chair/3-in-1 (Rt UE assist) Details for Transfer Assistance: VCs for technique. patient able to manage NWB of LLE this session. Patient transferred x4 this session from bed>WC>BSC>WC>recliner very well and limited cues. Patient educated to be as close as possible to surfaces with transferring as he cannot hop and only pivot.     Exercises      Balance     End of Session OT - End of Session Activity Tolerance: Patient tolerated treatment well Patient left: in chair;with call bell/phone within reach Nurse Communication: Patient requests pain meds  GO     Randy Andersen, Randy Andersen 10/10/2012, 12:58 PM

## 2012-10-10 NOTE — Progress Notes (Signed)
Physical Therapy Treatment Patient Details Name: Randy Andersen MRN: [id number] DOB: 03/19/1956 Today's Date: 10/10/2012 Time: 1000-1049 PT Time Calculation (min): 49 min  PT Assessment / Plan / Recommendation Comments on Treatment Session  Patient progressing well with various transfers. Patient educated on [facility] and wheelchair parts which he did very well recalling and utilizing through session. Did not mobilize in chair however will attempt tomorrow. Patient has stairs to enter and states he has various friends that can assist with that at home. Patient is extremely motivated and eager to participate in therapy    Follow Up Recommendations  Home health PT     Does the patient have the potential to tolerate intense rehabilitation     Barriers to Discharge        Equipment Recommendations  Wheelchair (measurements PT)    Recommendations for Other Services    Frequency Min 5X/week   Plan Discharge plan remains appropriate;Frequency remains appropriate    Precautions / Restrictions Precautions Precautions: Fall Required Braces or Orthoses: Knee Immobilizer - Left Restrictions Weight Bearing Restrictions: Yes LLE Weight Bearing: Touchdown weight bearing Other Position/Activity Restrictions: L sling  - clavicle fracture   Pertinent Vitals/Pain 8/10 L Leg pain after session. RN aware    Mobility  Bed Mobility Supine to Sit: 5: Supervision Sitting - Scoot to Edge of Bed: 5: Supervision Details for Bed Mobility Assistance: Patient able to position LLE with increased time and cues for technique with positioning Transfers Sit to Stand: 5: Supervision;With upper extremity assist;With armrests Stand to Sit: 5: Supervision;With upper extremity assist;With armrests Stand Pivot Transfers: 4: Min guard;With armrests;From elevated surface Details for Transfer Assistance: VCs for technique. patient able to manage NWB of LLE this session. Patient transferred x4 this session from  bed>WC>BSC>WC>recliner very well and limited cues. Patient educated to be as close as possible to surfaces with transferring as he cannot hop and only pivot.  Ambulation/Gait Ambulation/Gait Assistance: Not tested (comment) Stairs: No Wheelchair Mobility Wheelchair Mobility: No    Exercises     PT Diagnosis:    PT Problem List:   PT Treatment Interventions:     PT Goals Acute Rehab PT Goals PT Goal: Supine/Side to Sit - Progress: Met PT Transfer Goal: Bed to Chair/Chair to Bed - Progress: Progressing toward goal PT Goal: Propel Wheelchair - Progress: Progressing toward goal  Visit Information  Assistance Needed: +1    Subjective Data      Cognition  Cognition Overall Cognitive Status: Appears within functional limits for tasks assessed/performed Arousal/Alertness: Awake/alert Orientation Level: Appears intact for tasks assessed Behavior During Session: [facility] for tasks performed    Balance     End of Session PT - End of Session Equipment Utilized During Treatment: Left knee immobilizer Activity Tolerance: Patient tolerated treatment well Patient left: in chair;with call bell/phone within reach Nurse Communication: Mobility status;Weight bearing status   GP     [name] 10/10/2012, 11:28 AM  10/10/2012 [name] PTA [phone] pager [phone] office
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
## 2012-10-10 NOTE — Progress Notes (Signed)
Subjective: 2 Days Post-Op Procedure(s) (LRB): OPEN REDUCTION INTERNAL FIXATION (ORIF) TIBIAL PLATEAU (Left) Patient reports pain as moderate.   Reports he was dizzy with out of bed yesterday.  Denies nausea. Objective: Vital signs in last 24 hours: Temp:  [97.2 F (36.2 C)-99.4 F (37.4 C)] 98.9 F (37.2 C) (03/20 0547) Pulse Rate:  [92-111] 92 (03/20 0547) Resp:  [18] 18 (03/20 0547) BP: (129-186)/(73-90) 129/73 mmHg (03/20 0939) SpO2:  [98 %-100 %] 98 % (03/20 0547) Weight:  [89.359 kg (197 lb)] 89.359 kg (197 lb) (03/19 2100)  Intake/Output from previous day: 03/19 0701 - 03/20 0700 In: 2632.5 [P.O.:510; I.V.:2122.5] Out: 1650 [Urine:1650] Intake/Output this shift:     Recent Labs  10/08/12 0515  HGB 14.4    Recent Labs  10/08/12 0515  WBC 6.6  RBC 4.87  HCT 40.6  PLT 185    Recent Labs  10/08/12 0515 10/09/12 0445  NA 140 137  K 3.5 3.7  CL 106 101  CO2 25 27  BUN 11 10  CREATININE 1.14 1.16  GLUCOSE 98 125*  CALCIUM 9.0 9.1    Recent Labs  10/09/12 0445 10/10/12 0600  INR 1.04 1.51*    Neurovascular intact Sensation intact distally Dorsiflexion/Plantar flexion intact Incision: dressing is a bulky cotton dressing and pt complains of pain at lateral knee in "hardened area" .  on exam this is an area of dried blood.  there is no splint.  explained to pt.  he wishes to have it changed  Assessment/Plan: 2 Days Post-Op Procedure(s) (LRB): OPEN REDUCTION INTERNAL FIXATION (ORIF) TIBIAL PLATEAU (Left) Up with therapy Discharge home with home health when stable with PT. Possibly tomorrow Oral pain meds. Will check labs as pt complains of dizziness.  Khila Papp M 10/10/2012, 9:57 AM

## 2012-10-11 LAB — PROTIME-INR
INR: 1.89 — ABNORMAL HIGH (ref 0.00–1.49)
Prothrombin Time: 21 seconds — ABNORMAL HIGH (ref 11.6–15.2)

## 2012-10-11 MED ORDER — WARFARIN SODIUM 5 MG PO TABS
5.0000 mg | ORAL_TABLET | Freq: Once | ORAL | Status: AC
  Administered 2012-10-11: 5 mg via ORAL
  Filled 2012-10-11: qty 1

## 2012-10-11 MED ORDER — METHOCARBAMOL 500 MG PO TABS: 500.0000 mg | ORAL_TABLET | Freq: Four times a day (QID) | ORAL | Status: DC | PRN

## 2012-10-11 MED ORDER — WARFARIN SODIUM 5 MG PO TABS: 5.0000 mg | ORAL_TABLET | Freq: Every day | ORAL | Status: DC

## 2012-10-11 MED ORDER — HYDROCODONE-ACETAMINOPHEN 10-325 MG PO TABS: 1.0000 | ORAL_TABLET | Freq: Four times a day (QID) | ORAL | Status: DC | PRN
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
## 2012-10-11 NOTE — Progress Notes (Signed)
Occupational Therapy Treatment Patient Details Name: Knoxx Boeding MRN: [id number] DOB: 1955-11-05 Today's Date: 10/11/2012 Time: 4098-1191 OT Time Calculation (min): 46 min  OT Assessment / Plan / Recommendation Comments on Treatment Session Pt. deferred  LE dressing secondary to girlfriend to assist. Pt. was ed. on uses of reacher for dressing tasks and verbalized understanding. Pt. ed. on dressing of AE with clavicle fx. and to use large front opening shirts and to don L UE first and doff last. Pt. deferred 3-1 transfer secondary to pt. stating he can perform without difficulty.  (Pt. will take sink bath and not attempt tub transfer.)    Follow Up Recommendations  Home health OT    Barriers to Discharge       Equipment Recommendations  3 in 1 bedside comode    Recommendations for Other Services    Frequency     Plan Discharge plan remains appropriate    Precautions / Restrictions Precautions Precautions: Fall Required Braces or Orthoses: Knee Immobilizer - Left Restrictions Weight Bearing Restrictions: Yes LUE Weight Bearing: Weight bearing as tolerated LLE Weight Bearing: Touchdown weight bearing Other Position/Activity Restrictions:  (L sling clavicle fx. )   Pertinent Vitals/Pain     ADL  Grooming: Performed;Wash/dry hands;Wash/dry face;Teeth care;Brushing hair;Set up Where Assessed - Grooming: Supine, head of bed up Upper Body Bathing: Performed;Set up Where Assessed - Upper Body Bathing: Supine, head of bed up Lower Body Bathing: Performed;Minimal assistance Where Assessed - Lower Body Bathing: Supine, head of bed up;Supine, head of bed flat Upper Body Dressing: Performed;Set up Where Assessed - Upper Body Dressing: Supine, head of bed up ADL Comments: Pt. states that girlfriend will assist with don/doff of socks and sheos. Pt. was ed. on use of reacher for LE dressing and use of long handled sponge brush for bathing. Pt. demo'd use of sponge brush. Pt. stated that he  does want a 3-1 commode for use at home secondary to height of toilet at home and NWB status. Pt. will be using w/c initally for mobililty.     OT Diagnosis:    OT Problem List:   OT Treatment Interventions:     OT Goals ADL Goals ADL Goal: Upper Body Bathing - Progress: Progressing toward goals ADL Goal: Lower Body Bathing - Progress: Progressing toward goals ADL Goal: Upper Body Dressing - Progress: Progressing toward goals  Visit Information  Last OT Received On: 10/11/12 Assistance Needed: +1    Subjective Data      Prior Functioning       Cognition  Cognition Overall Cognitive Status: Appears within functional limits for tasks assessed/performed Arousal/Alertness: Awake/alert Orientation Level: Appears intact for tasks assessed Behavior During Session: [facility] for tasks performed    Mobility       Exercises      Balance     End of Session OT - End of Session Activity Tolerance: Patient tolerated treatment well Patient left: in bed  GO     Markie Heffernan 10/11/2012, 10:51 AM

## 2012-10-11 NOTE — Progress Notes (Signed)
Patient discharged in stable condition via wheelchair. Discharge instructions and prescriptions were given and explained 

## 2012-10-11 NOTE — Progress Notes (Signed)
Subjective: 3 Days Post-Op Procedure(s) (LRB): OPEN REDUCTION INTERNAL FIXATION (ORIF) TIBIAL PLATEAU (Left) Patient reports pain as moderate. Better after elevation of leg above heart. Has tolerated transfers well and has arrangement for assist at home   Objective: Vital signs in last 24 hours: Temp:  [98.1 F (36.7 C)-99.4 F (37.4 C)] 99.4 F (37.4 C) (03/21 0500) Pulse Rate:  [78-92] 78 (03/21 0500) Resp:  [18-20] 18 (03/21 0500) BP: (118-144)/(73-85) 127/74 mmHg (03/21 0500) SpO2:  [97 %-99 %] 97 % (03/21 0500)  Intake/Output from previous day: 03/20 0701 - 03/21 0700 In: 840 [P.O.:600; I.V.:240] Out: 300 [Urine:300] Intake/Output this shift:     Recent Labs  10/10/12 1053  HGB 13.8    Recent Labs  10/10/12 1053  HCT 38.9*    Recent Labs  10/09/12 0445 10/10/12 1053  NA 137 138  K 3.7 3.9  CL 101 102  CO2 27 26  BUN 10 12  CREATININE 1.16 1.23  GLUCOSE 125* 130*  CALCIUM 9.1 9.2    Recent Labs  10/10/12 0600 10/11/12 0550  INR 1.51* 1.89*    Neurovascular intact Sensation intact distally Intact pulses distally Dorsiflexion/Plantar flexion intact Incision: dressing C/D/I  Assessment/Plan: 3 Days Post-Op Procedure(s) (LRB): OPEN REDUCTION INTERNAL FIXATION (ORIF) TIBIAL PLATEAU (Left) Up with therapy to [facility]  Discharge home today with DME OV with DR Ophelia Charter 1-2 weeks pt to call for time RX Vicodin per pt request, robaxin and coumadin per pharmacy for 2 weeks Dressing will be changed to day and Jones dressing removed.  He will need only a light dressing on wound of knee   Ricardo Schubach M 10/11/2012, 10:52 AM

## 2012-10-11 NOTE — Progress Notes (Signed)
Physical Therapy Treatment Patient Details Name: Randy Andersen MRN: [id number] DOB: 1955-09-30 Today's Date: 10/11/2012 Time: 1110-1130 PT Time Calculation (min): 20 min  PT Assessment / Plan / Recommendation Comments on Treatment Session  Patient conitinues to do well with mobility. Educated on WC parts and mobility    Follow Up Recommendations  Home health PT     Does the patient have the potential to tolerate intense rehabilitation     Barriers to Discharge        Equipment Recommendations  Wheelchair (measurements PT)    Recommendations for Other Services    Frequency Min 5X/week   Plan Discharge plan remains appropriate;Frequency remains appropriate    Precautions / Restrictions Precautions Precautions: Fall Required Braces or Orthoses: Knee Immobilizer - Left Restrictions Weight Bearing Restrictions: Yes LUE Weight Bearing: Weight bearing as tolerated LLE Weight Bearing: Touchdown weight bearing Other Position/Activity Restrictions:  (L sling clavicle fx. )   Pertinent Vitals/Pain     Mobility  Transfers Sit to Stand: 6: Modified independent (Device/Increase time) Stand to Sit: 6: Modified independent (Device/Increase time) Stand Pivot Transfers: 5: Supervision Details for Transfer Assistance: Patient with good TWB of LLE. Cues for positioning of WC prior to transfer Ambulation/Gait Ambulation/Gait Assistance: Not tested (comment) Wheelchair Mobility Wheelchair Mobility: Yes Wheelchair Assistance: 4: Administrator, sports Details (indicate cue type and reason): Cues for technique, use of wheels and turning Wheelchair Propulsion: Right upper extremity;Right lower extremity Wheelchair Parts Management: Supervision/cueing Distance: 100    Exercises     PT Diagnosis:    PT Problem List:   PT Treatment Interventions:     PT Goals Acute Rehab PT Goals PT Transfer Goal: Bed to Chair/Chair to Bed - Progress: Met PT Goal: Up/Down Stairs - Progress:  Other (comment) (educated on WC with 2 people and stairs) PT Goal: Propel Wheelchair - Progress: Progressing toward goal  Visit Information  Last PT Received On: 10/11/12 Assistance Needed: +1    Subjective Data      Cognition  Cognition Overall Cognitive Status: Appears within functional limits for tasks assessed/performed Arousal/Alertness: Awake/alert Orientation Level: Appears intact for tasks assessed Behavior During Session: [facility] for tasks performed    Balance     End of Session PT - End of Session Equipment Utilized During Treatment: Left knee immobilizer Activity Tolerance: Patient tolerated treatment well Patient left: in chair;with call bell/phone within reach   GP     [name] 10/11/2012, 11:59 AM 10/11/2012 [name] PTA [phone] pager [phone] office
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
## 2013-01-20 ENCOUNTER — Emergency Department (HOSPITAL_COMMUNITY): Payer: Non-veteran care

## 2013-01-20 ENCOUNTER — Encounter (HOSPITAL_COMMUNITY): Payer: Self-pay | Admitting: Emergency Medicine

## 2013-01-20 ENCOUNTER — Emergency Department (HOSPITAL_COMMUNITY)
Admission: EM | Admit: 2013-01-20 | Discharge: 2013-01-20 | Disposition: A | Discharge status: Home / Self Care | Payer: Non-veteran care | Attending: Emergency Medicine | Admitting: Emergency Medicine

## 2013-01-20 DIAGNOSIS — Z87891 Personal history of nicotine dependence: Secondary | ICD-10-CM | POA: Insufficient documentation

## 2013-01-20 DIAGNOSIS — R202 Paresthesia of skin: Secondary | ICD-10-CM

## 2013-01-20 DIAGNOSIS — R011 Cardiac murmur, unspecified: Secondary | ICD-10-CM | POA: Insufficient documentation

## 2013-01-20 DIAGNOSIS — I10 Essential (primary) hypertension: Secondary | ICD-10-CM | POA: Insufficient documentation

## 2013-01-20 DIAGNOSIS — G8929 Other chronic pain: Secondary | ICD-10-CM | POA: Insufficient documentation

## 2013-01-20 DIAGNOSIS — R209 Unspecified disturbances of skin sensation: Secondary | ICD-10-CM | POA: Insufficient documentation

## 2013-01-20 DIAGNOSIS — Z79899 Other long term (current) drug therapy: Secondary | ICD-10-CM | POA: Insufficient documentation

## 2013-01-20 DIAGNOSIS — M79609 Pain in unspecified limb: Secondary | ICD-10-CM | POA: Insufficient documentation

## 2013-01-20 LAB — POCT I-STAT, CHEM 8
BUN: 16 mg/dL (ref 6–23)
Calcium, Ion: 1.24 mmol/L — ABNORMAL HIGH (ref 1.12–1.23)
Chloride: 102 mEq/L (ref 96–112)
Creatinine, Ser: 1.5 mg/dL — ABNORMAL HIGH (ref 0.50–1.35)
Glucose, Bld: 85 mg/dL (ref 70–99)
HCT: 48 % (ref 39.0–52.0)
Hemoglobin: 16.3 g/dL (ref 13.0–17.0)
Potassium: 4.5 mEq/L (ref 3.5–5.1)
Sodium: 140 mEq/L (ref 135–145)
TCO2: 27 mmol/L (ref 0–100)

## 2013-01-20 LAB — CBC
HCT: 44.5 % (ref 39.0–52.0)
Hemoglobin: 16.1 g/dL (ref 13.0–17.0)
MCH: 30 pg (ref 26.0–34.0)
MCHC: 36.2 g/dL — ABNORMAL HIGH (ref 30.0–36.0)
MCV: 82.9 fL (ref 78.0–100.0)
Platelets: 216 10*3/uL (ref 150–400)
RBC: 5.37 MIL/uL (ref 4.22–5.81)
RDW: 13.5 % (ref 11.5–15.5)
WBC: 5.9 10*3/uL (ref 4.0–10.5)

## 2013-01-20 LAB — POCT I-STAT TROPONIN I: Troponin i, poc: 0 ng/mL (ref 0.00–0.08)

## 2013-01-20 IMAGING — CT CT CHEST W/ CM
2 of 5 series · 15 of 36 positions shown, 18 images · IV contrast (APPLIED)
Comparison: PA and lateral chest [DATE] and [WX] hours.

CLINICAL DATA: Chest x-ray with a possible left upper lobe nodule.

CT CHEST WITH CONTRAST
TECHNIQUE: Multidetector CT imaging of the chest was performed
following the standard protocol during bolus administration of
intravenous contrast.
Contrast: 80mL OMNIPAQUE IOHEXOL 300 MG/ML  SOLN

[Series 5: thins · axial · 0.74mm/px · z∈[-595,-286]mm · 12 of 429 slices shown, 15 images]
[im 21/429  mediastinal]
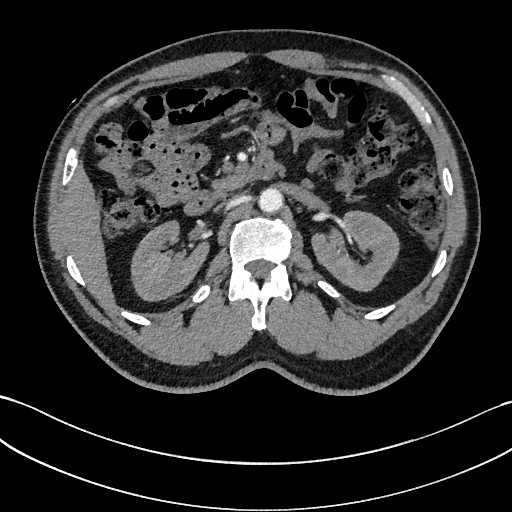
[im 21/429  lung]
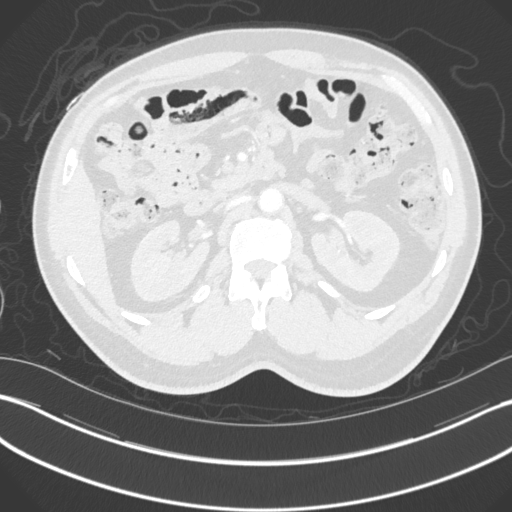
[im 62/429  lung]
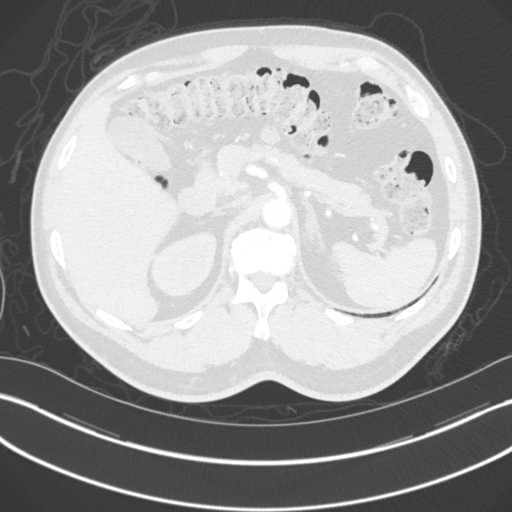
[im 102/429  lung]
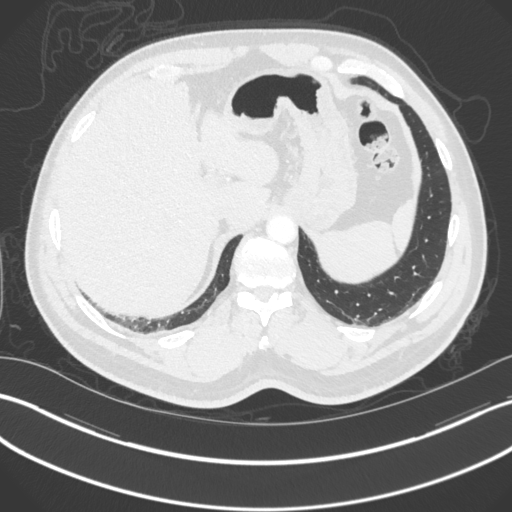
[im 123/429  lung]
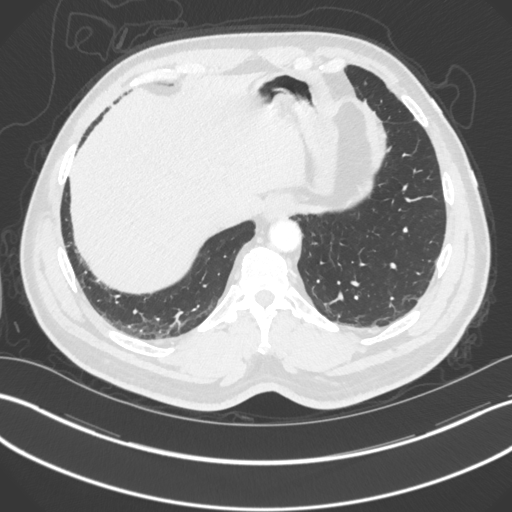
[im 164/429  mediastinal]
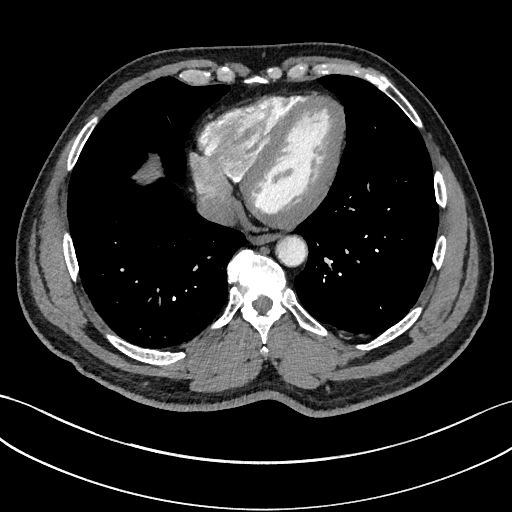
[im 164/429  lung]
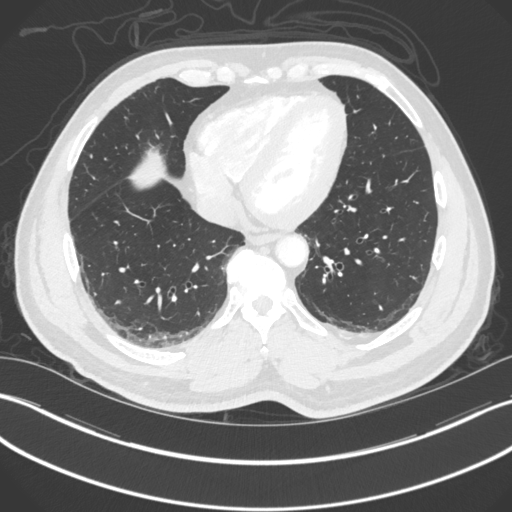
[im 204/429  lung]
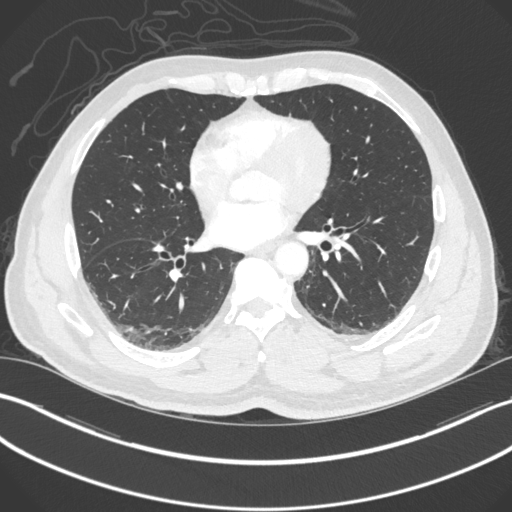
[im 225/429  lung]
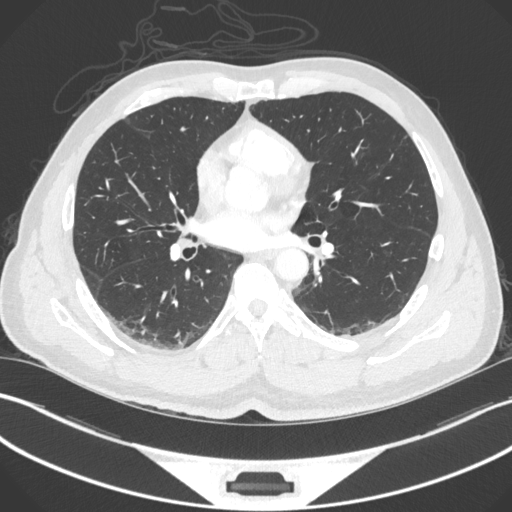
[im 265/429  lung]
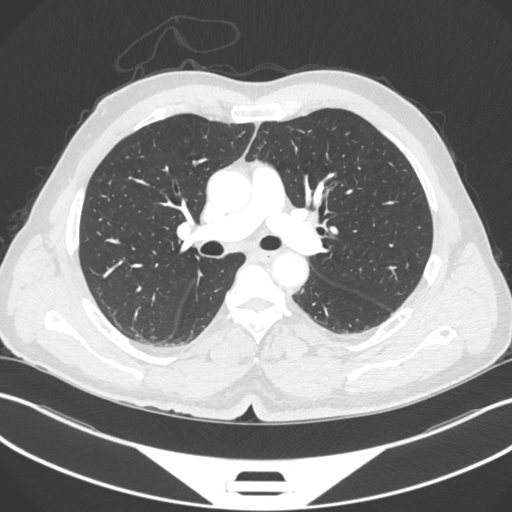
[im 306/429  mediastinal]
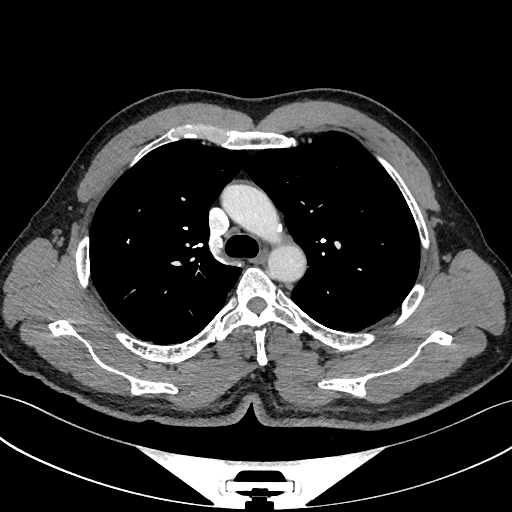
[im 306/429  lung]
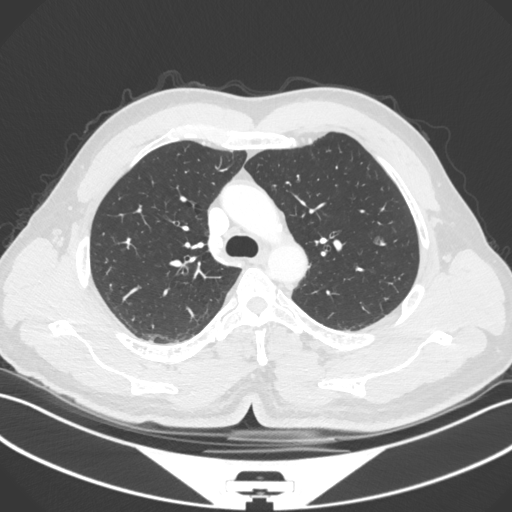
[im 327/429  lung]
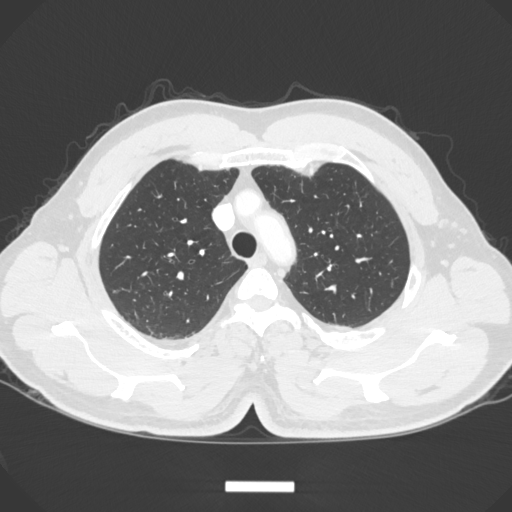
[im 367/429  lung]
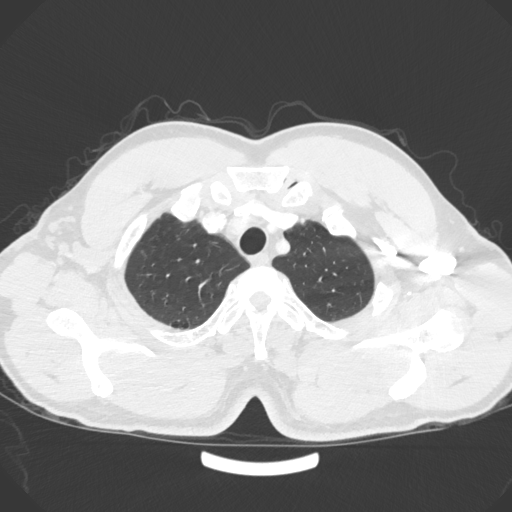
[im 408/429  lung]
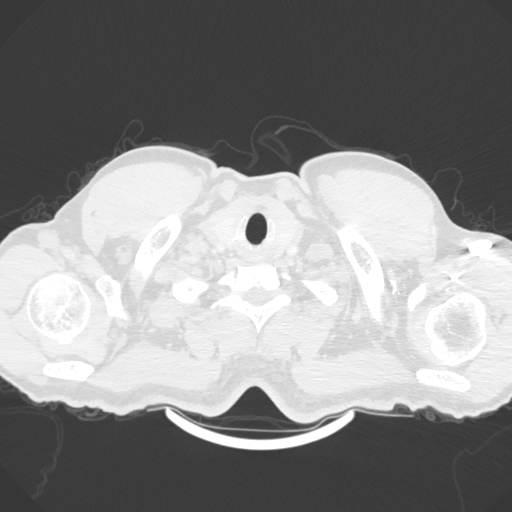

[Series 6: coronal · coronal · 0.67mm/px · 3 of 119 slices shown]
[im 24/119  lung]
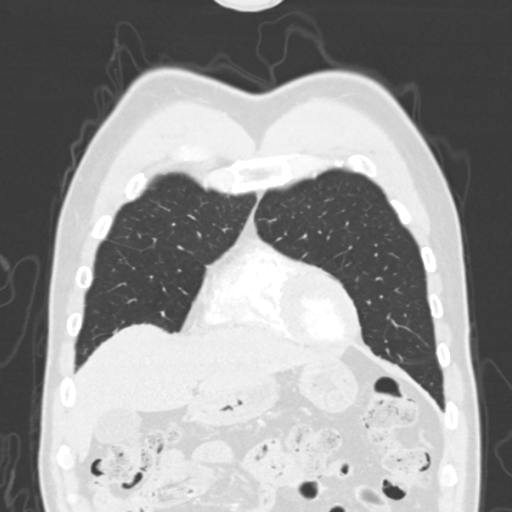
[im 48/119  lung]
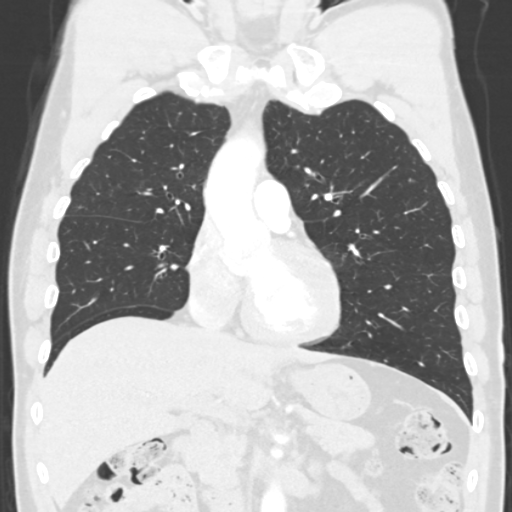
[im 71/119  lung]
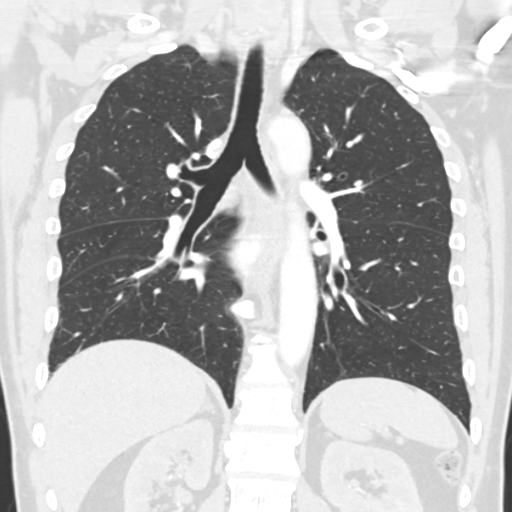

[15 of 36 positions shown; findings below may reference images not displayed]

FINDINGS: There is no axillary, hilar or mediastinal
lymphadenopathy.  No pleural or pericardial effusion.  There are a
few small foci of aortic and coronary atherosclerotic vascular
disease.  No abnormality to correlate with finding on chest x-ray
is seen.  There is a small ground-glass attenuation nodule
measuring 0.6 cm in the left upper lobe on image 21.  Mild
dependent atelectasis is also identified.  The lungs are otherwise
unremarkable.  The airway appears normal.  Visualized upper abdomen
is unremarkable.  No focal bony abnormality is identified with
scattered thoracic spondylosis noted.
IMPRESSION: 1.  No nodule correlate with finding on chest x-ray is identified
but the patient does have a 0.6 cm subsolid nodule in the left
upper lobe.  Initial follow-up by chest CT without contrast is
recommended in 3 months to confirm persistence.   This
recommendation follows the consensus statement: Recommendations for
the Management of Subsolid Pulmonary Nodules Detected at CT:  A
Statement from the [HOSPITAL] as published in Radiology
2.  Scattered, small foci of calcific aortic and coronary
atherosclerosis.

## 2013-01-20 IMAGING — CR DG CHEST 2V
2 series · 2 of 2 positions shown · non-contrast
Comparison: None

CLINICAL DATA: Hypertension, body numbness, former smoker

CHEST - 2 VIEW

[w chest pa]
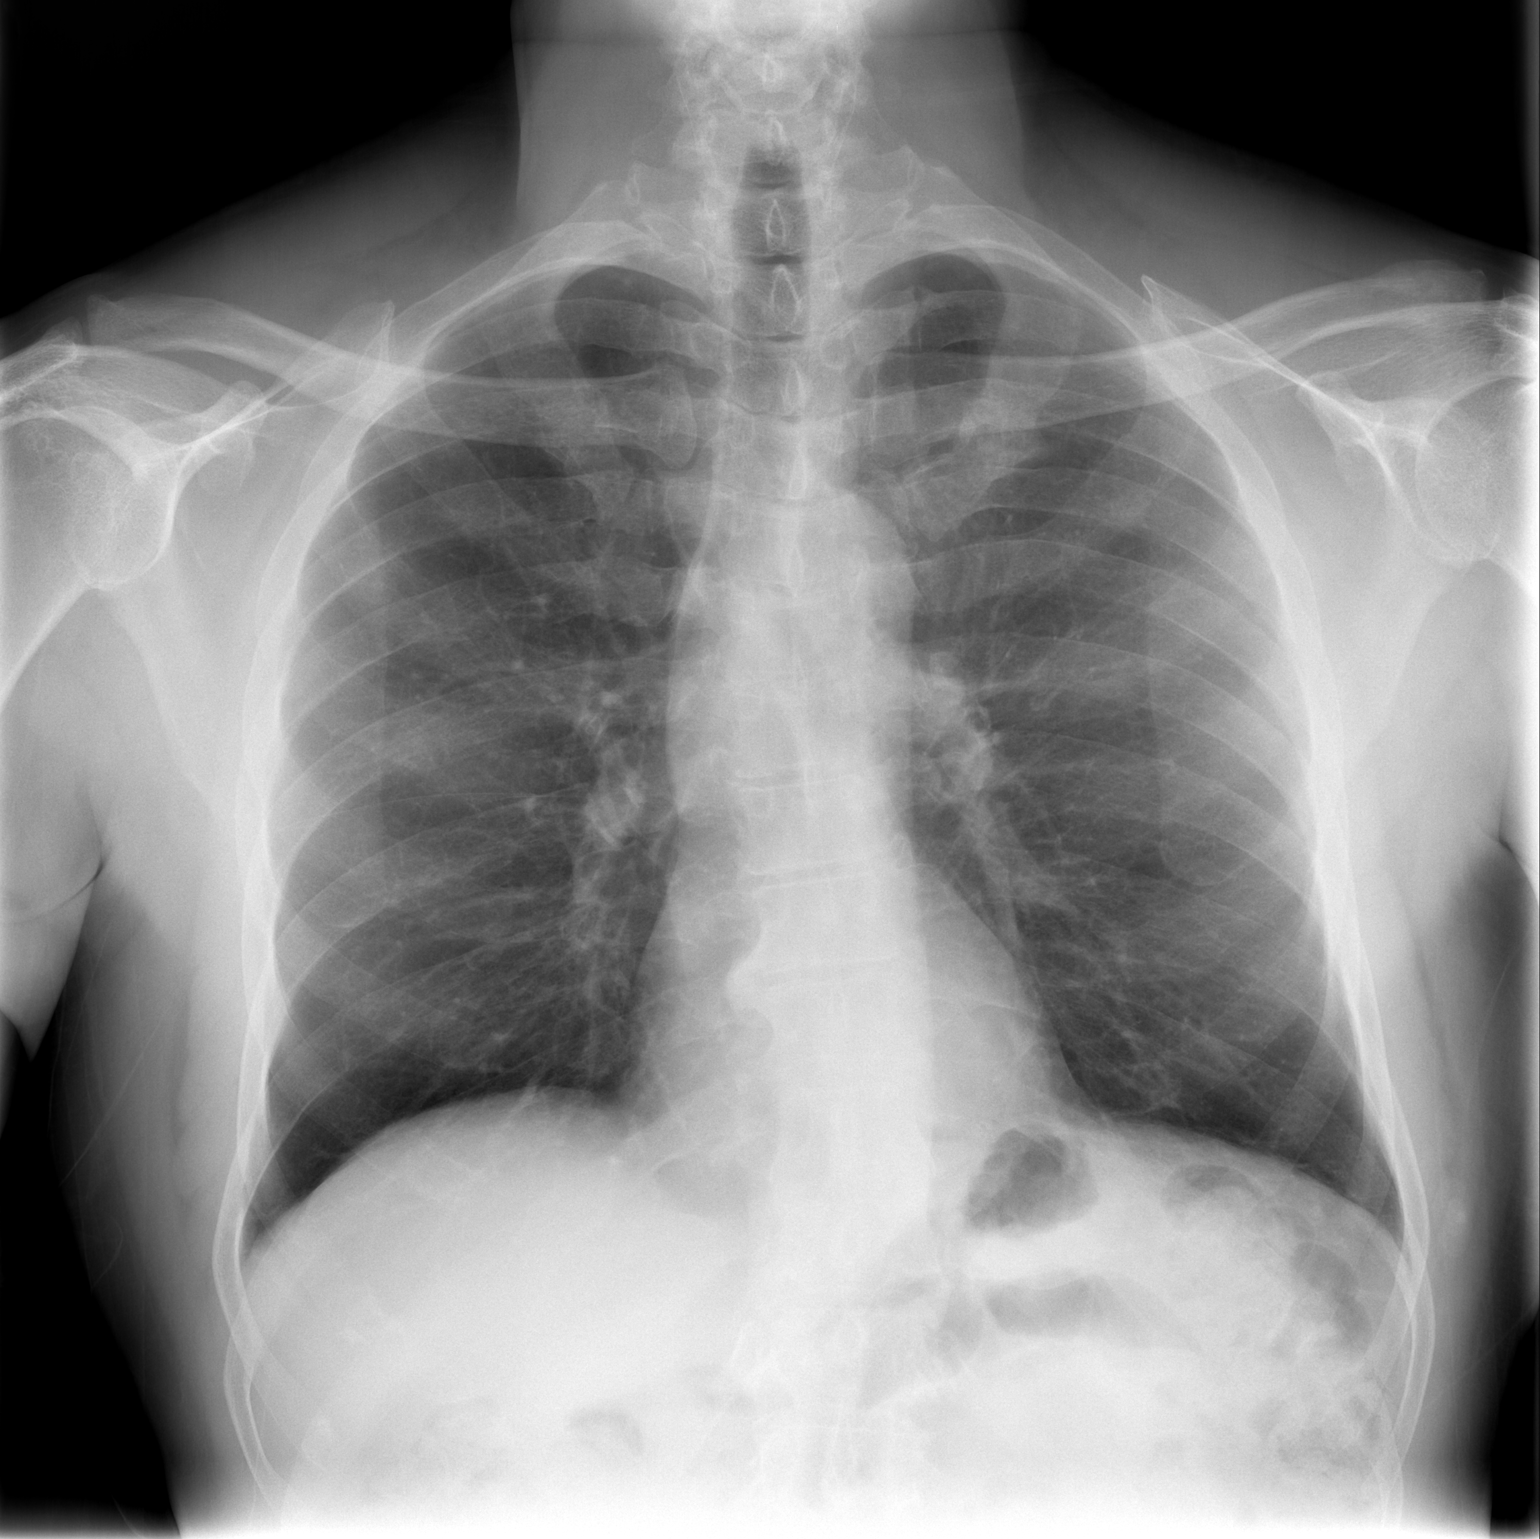

[w chest lat]
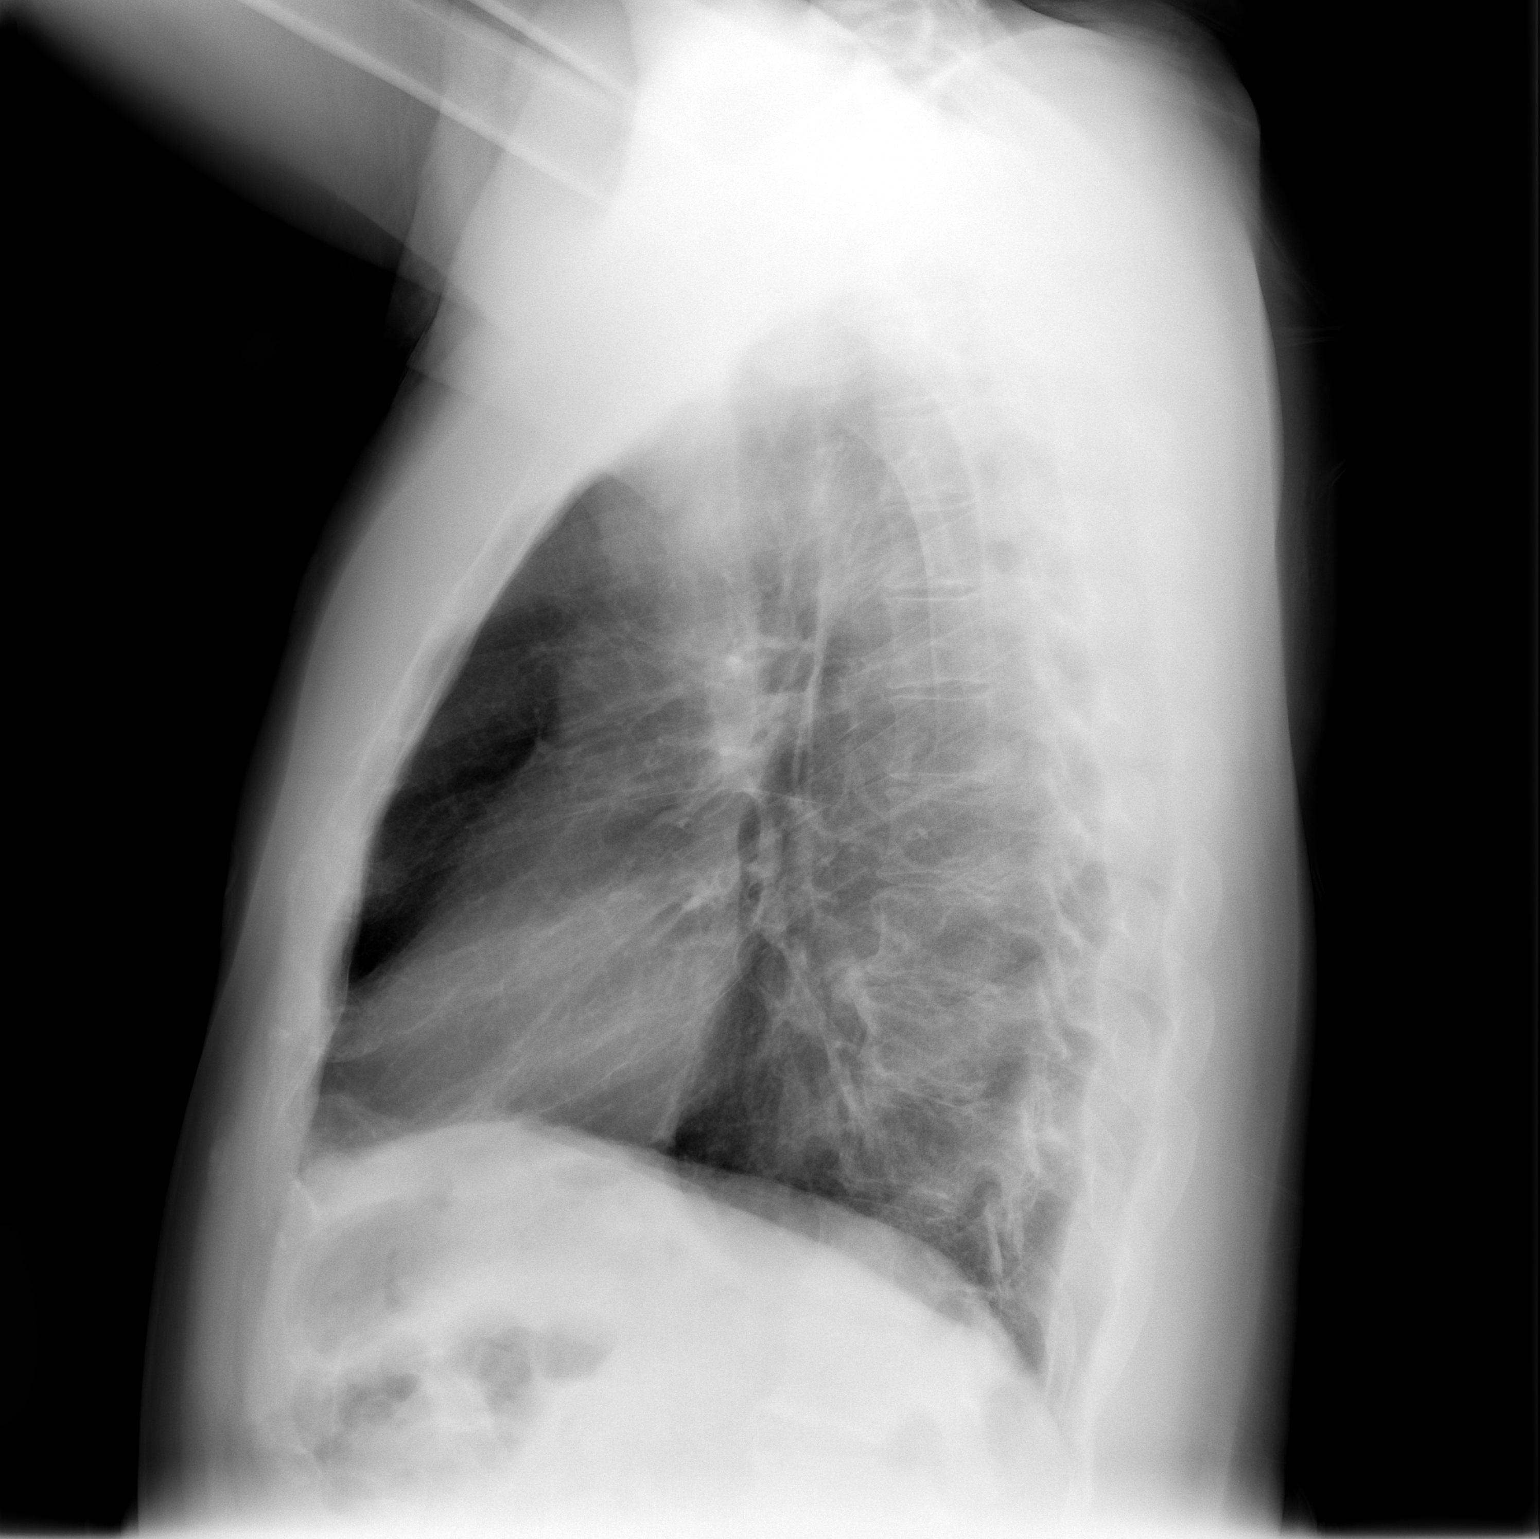

[2 of 2 positions shown; findings below may reference images not displayed]

FINDINGS: Normal heart size, mediastinal contours, and pulmonary vascularity.
Emphysematous and minimal bronchitic changes consistent with COPD.
No acute infiltrate, pleural effusion or pneumothorax.
Questionable poorly defined density 11 mm diameter in left upper
lobe versus summation artifact.
Endplate spur formation and minimal scoliosis of thoracic spine.
IMPRESSION: COPD changes with questionable 11 mm diameter nodular density
versus summation artifact in left upper lobe; CT chest recommended
to exclude pulmonary nodule.

## 2013-01-20 IMAGING — CT CT HEAD W/O CM
1 series · 16 of 30 positions shown, 20 images · non-contrast
Comparison: None.

CLINICAL DATA: Intermittent numbness in the left arm and leg.
Hypertension.

CT HEAD WITHOUT CONTRAST
TECHNIQUE: Contiguous axial images were obtained from the base of
the skull through the vertex without contrast.

[Series 2: head 5.0 h30s · axial · 0.46mm/px · z∈[-157,-17]mm · 16 of 32 slices shown, 20 images]
[im 2/32  brain]
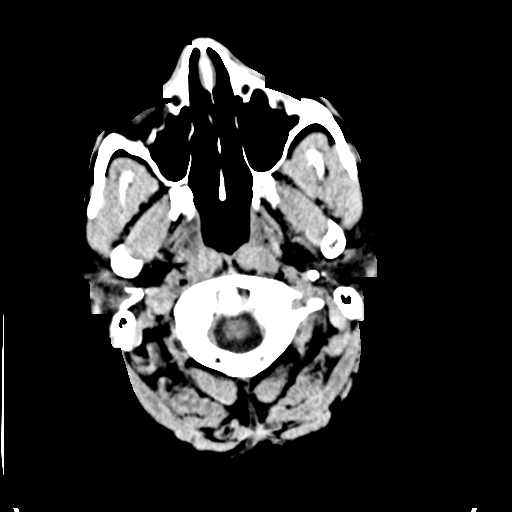
[im 2/32  bone]
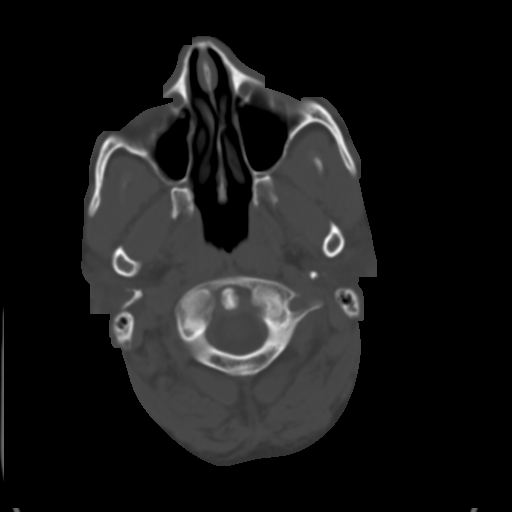
[im 4/32  brain]
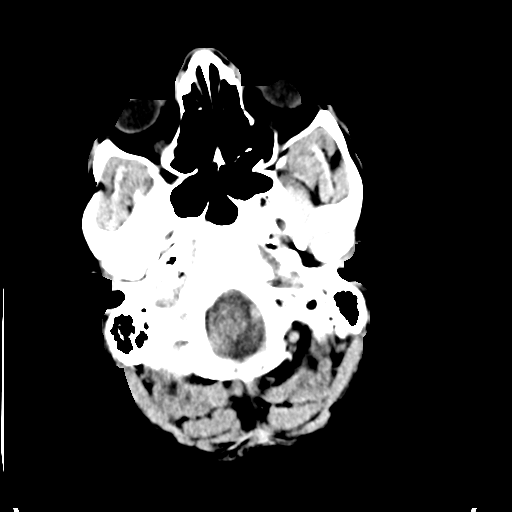
[im 6/32  brain]
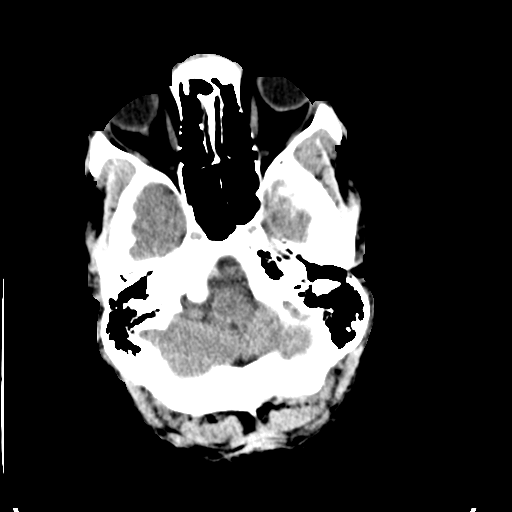
[im 8/32  brain]
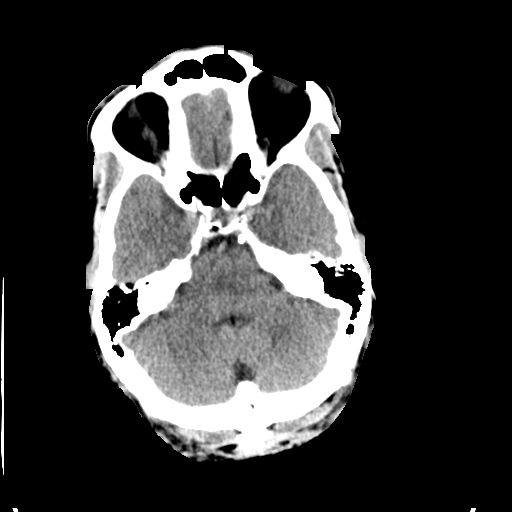
[im 9/32  brain]
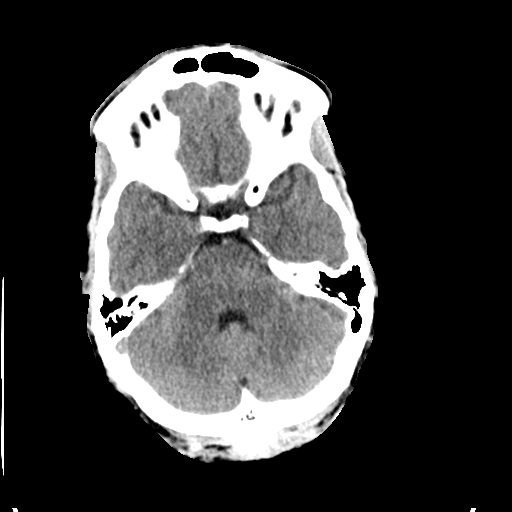
[im 9/32  bone]
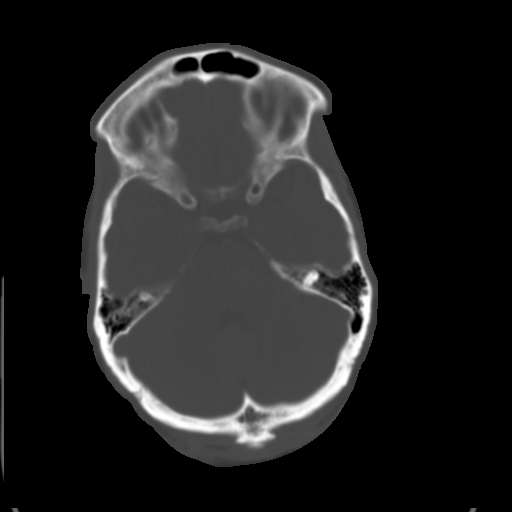
[im 11/32  brain]
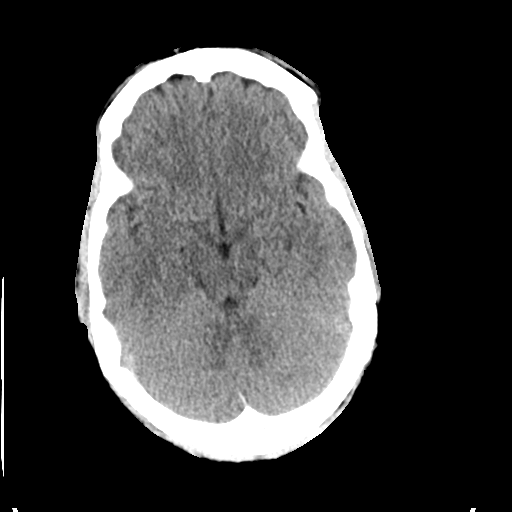
[im 13/32  brain]
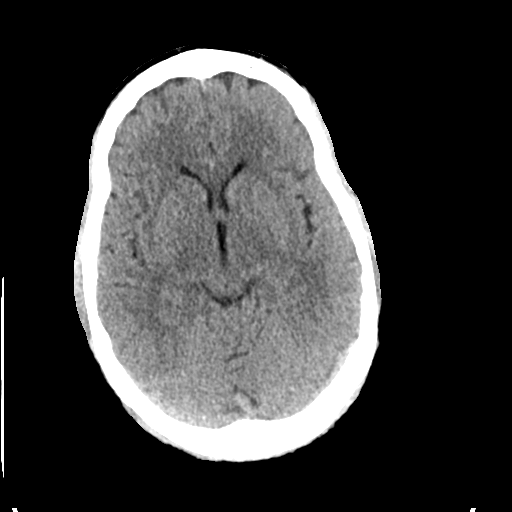
[im 15/32  brain]
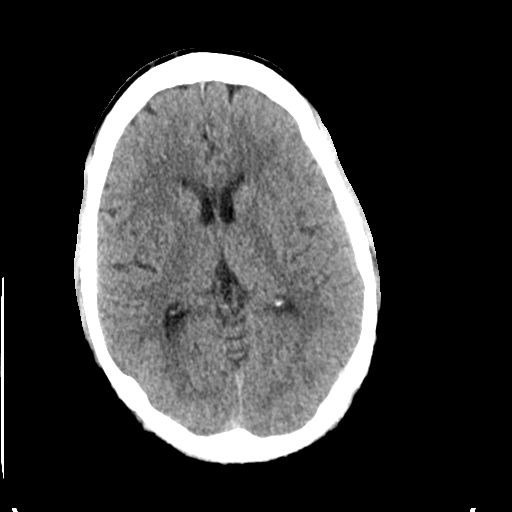
[im 17/32  brain]
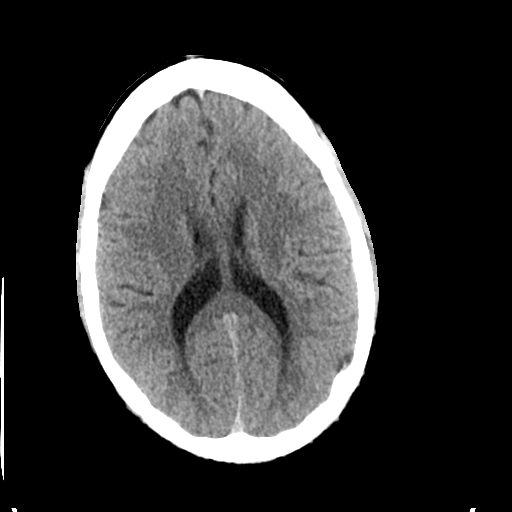
[im 17/32  bone]
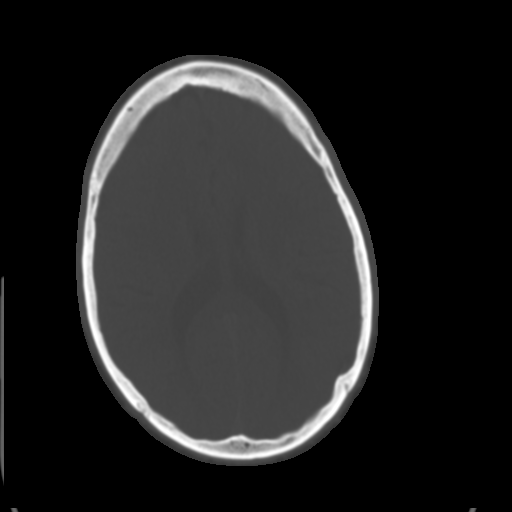
[im 19/32  brain]
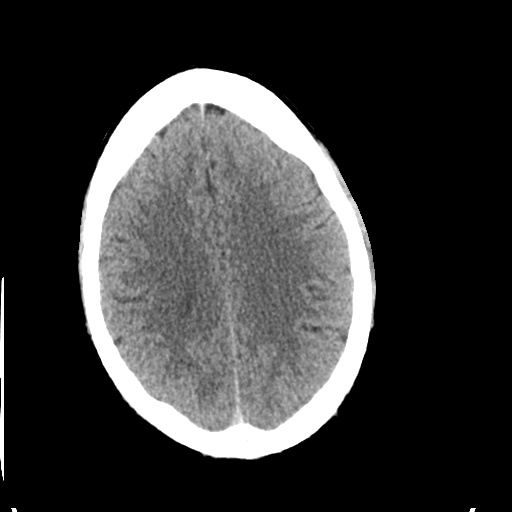
[im 21/32  brain]
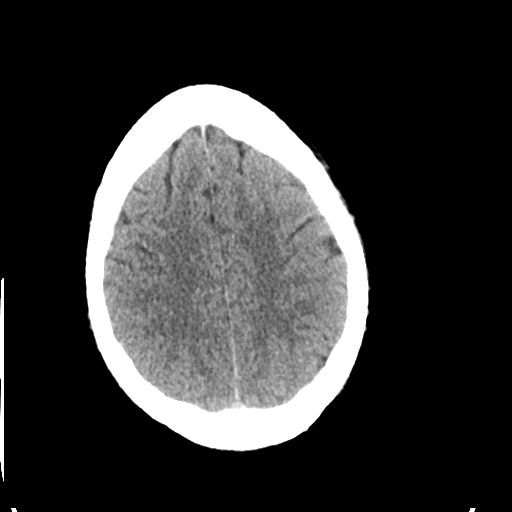
[im 23/32  brain]
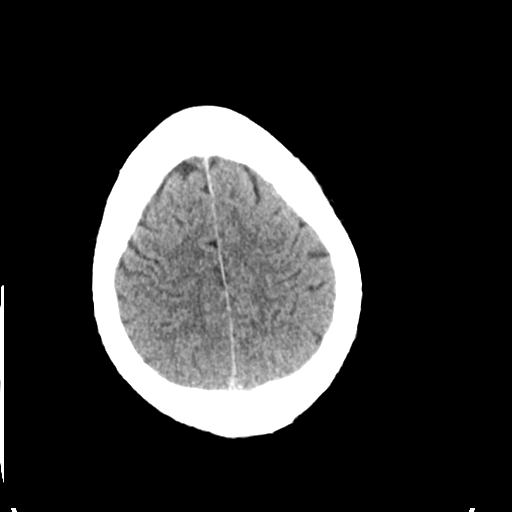
[im 24/32  brain]
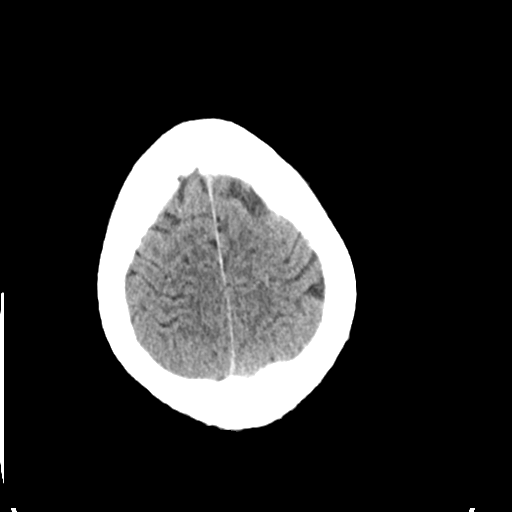
[im 24/32  bone]
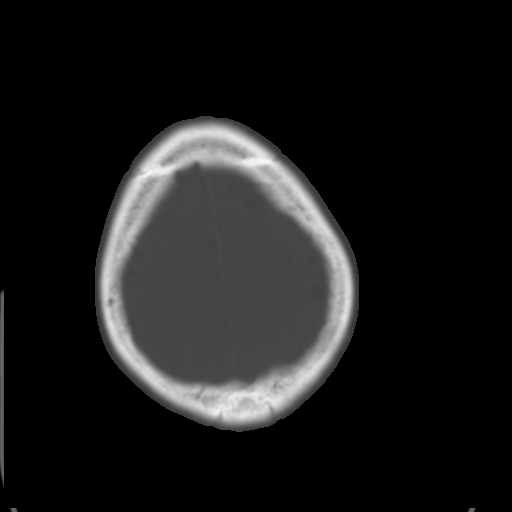
[im 26/32  brain]
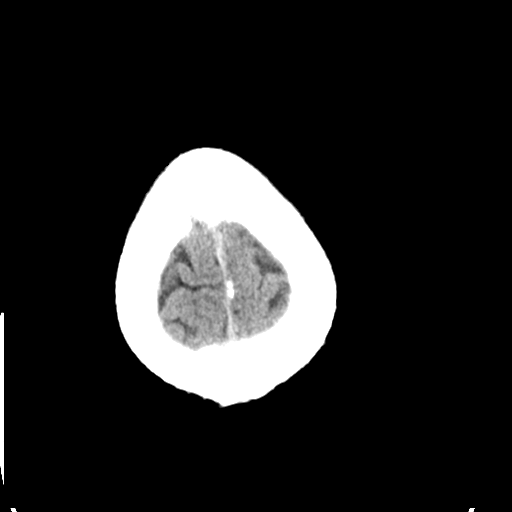
[im 28/32  brain]
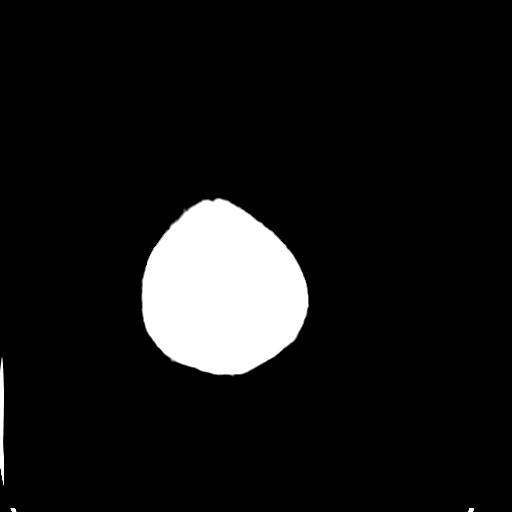
[im 30/32  brain]
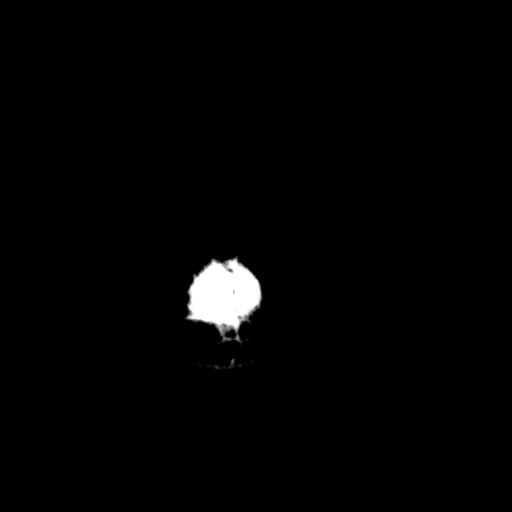

[16 of 30 positions shown; findings below may reference images not displayed]

FINDINGS: The brain appears normal without infarct, hemorrhage,
mass lesion, mass effect, midline shift or abnormal extra-axial
fluid collection.  No hydrocephalus or pneumocephalus.  The
calvarium is intact.
IMPRESSION: Negative exam.

## 2013-01-20 MED ORDER — IOHEXOL 300 MG/ML  SOLN
80.0000 mL | Freq: Once | INTRAMUSCULAR | Status: AC | PRN
  Administered 2013-01-20: 80 mL via INTRAVENOUS

## 2013-01-20 NOTE — ED Notes (Addendum)
PT has HTN and has not been taking blood pressure meds for 2 weeks; reports Saturday his whole right side got numb and it has been on and off again since then. No drift in extremities noted. Grips equal. Speech clear.

## 2013-01-20 NOTE — ED Notes (Signed)
Pt c/o intermittent numbness feeling to left arm and leg that started on Saturday. No facial droop, no arm drift, ambulatory without issues, speech is clear. Pt reports he has hx of HTN and stopped taking his medications 2 weeks ago because he just didn't feel like taking them anymore. Pt in nad, skin warm and dry, resp e/u.

## 2013-01-20 NOTE — ED Provider Notes (Signed)
History    CSN: [id number] Arrival date & time 01/20/13  1328  First MD Initiated Contact with Patient 01/20/13 1614     Chief Complaint  Patient presents with  . Tingling   (Consider location/radiation/quality/duration/timing/severity/associated sxs/prior Treatment) The history is provided by the patient.   Pt presents to the ED for intermittent paresthesias of right upper and lower extremity since Saturday 01/18/13 around 1am while he was at work.  Works night shift as a Engineer, materials at [employer]. No recent injury or trauma. Also notes some abnormal sensations over his right parietal/occiptal areas which he describes as a "funny" feeling.  States it does not feel like a headache, but it "does not feel quite right."  Pt takes amlodipine 10mg  daily- stopped for approx 2 weeks but re-started on Saturday after paresthesias began.  No personal or family hx of TIA, stroke, or MI.  No chest pain, SOB, dizziness, weakness, numbness, confusion, visual disturbance, or changes in speech.  Pt is a daily smoker, approx 1/2 PPD.  No new medications.  Past Medical History  Diagnosis Date  . Hypertension   . Chronic pain disorder     LT leg and foot  . Heart murmur    Past Surgical History  Procedure Laterality Date  . Leg surgery Left     PT reports he has pins placed in his Lt leg  . Orif tibia plateau  10/09/2012    Dr Ophelia Charter  . Orif tibia plateau Left 10/08/2012    Procedure: OPEN REDUCTION INTERNAL FIXATION (ORIF) TIBIAL PLATEAU;  Surgeon: Eldred Manges, MD;  Location: [facility] OR;  Service: Orthopedics;  Laterality: Left;   No family history on file. History  Substance Use Topics  . Smoking status: Former Smoker -- 28 years    Types: Cigarettes    Quit date: 10/07/2012  . Smokeless tobacco: Never Used  . Alcohol Use: No    Review of Systems  Neurological:       Paresthesias  All other systems reviewed and are negative.    Allergies  Review of patient's allergies indicates no known  allergies.  Home Medications   Current Outpatient Rx  Name  Route  Sig  Dispense  Refill  . HYDROcodone-acetaminophen (NORCO) 10-325 MG per tablet   Oral   Take 1 tablet by mouth every 6 (six) hours as needed.   60 tablet   0   . methocarbamol (ROBAXIN) 500 MG tablet   Oral   Take 1 tablet (500 mg total) by mouth every 6 (six) hours as needed.   30 tablet   0   . Multiple Vitamins-Minerals (MULTIVITAMIN PO)   Oral   Take 1 tablet by mouth daily.         Marland Kitchen PRESCRIPTION MEDICATION   Oral   Take 75 mg by mouth daily. Takes 37.5 mg of blood pressure medicine          BP 163/99  Pulse 71  Temp(Src) 98.5 F (36.9 C) (Oral)  Resp 20  SpO2 97%  Physical Exam  Nursing note and vitals reviewed. Constitutional: He is oriented to person, place, and time. He appears well-developed and well-nourished. No distress.  HENT:  Head: Normocephalic and atraumatic.  Mouth/Throat: Oropharynx is clear and moist.  Eyes: Conjunctivae and EOM are normal. Pupils are equal, round, and reactive to light.  Neck: Normal range of motion. Neck supple. No rigidity.  No meningeal signs  Cardiovascular: Normal rate, regular rhythm and normal heart sounds.  Pulmonary/Chest: Effort normal and breath sounds normal. No respiratory distress. He has no wheezes.  Abdominal: Soft. Bowel sounds are normal. There is no tenderness. There is no guarding.  Musculoskeletal: Normal range of motion. He exhibits no edema.  Neurological: He is alert and oriented to person, place, and time. He has normal strength. He displays no tremor. No cranial nerve deficit or sensory deficit. He displays no seizure activity. Gait normal.  CN grossly intact, moves all extremities appropriately, equal strength UE and LE bilaterally, sensation intact, normal gait, no facial droop  Skin: Skin is warm and dry.  Psychiatric: He has a normal mood and affect. His speech is normal.    ED Course  Procedures (including critical care  time)   Date: 01/20/2013  Rate: 78  Rhythm: normal sinus rhythm  QRS Axis: normal  Intervals: normal  ST/T Wave abnormalities: normal  Conduction Disutrbances:none  Narrative Interpretation: NSR, no STEMI  Old EKG Reviewed: unchanged   Labs Reviewed  CBC - Abnormal; Notable for the following:    MCHC 36.2 (*)    All other components within normal limits  POCT I-STAT, CHEM 8 - Abnormal; Notable for the following:    Creatinine, Ser 1.50 (*)    Calcium, Ion 1.24 (*)    All other components within normal limits  POCT I-STAT TROPONIN I   Dg Chest 2 View  01/20/2013   *RADIOLOGY REPORT*  Clinical Data: Hypertension, body numbness, former smoker  CHEST - 2 VIEW  Comparison: None  Findings: Normal heart size, mediastinal contours, and pulmonary vascularity. Emphysematous and minimal bronchitic changes consistent with COPD. No acute infiltrate, pleural effusion or pneumothorax. Questionable poorly defined density 11 mm diameter in left upper lobe versus summation artifact. Endplate spur formation and minimal scoliosis of thoracic spine.  IMPRESSION: COPD changes with questionable 11 mm diameter nodular density versus summation artifact in left upper lobe; CT chest recommended to exclude pulmonary nodule.   Original Report Authenticated By: Ulyses Southward, M.D.   Ct Head Wo Contrast  01/20/2013   *RADIOLOGY REPORT*  Clinical Data: Intermittent numbness in the left arm and leg. Hypertension.  CT HEAD WITHOUT CONTRAST  Technique:  Contiguous axial images were obtained from the base of the skull through the vertex without contrast.  Comparison: None.  Findings: The brain appears normal without infarct, hemorrhage, mass lesion, mass effect, midline shift or abnormal extra-axial fluid collection.  No hydrocephalus or pneumocephalus.  The calvarium is intact.  IMPRESSION: Negative exam.   Original Report Authenticated By: Holley Dexter, M.D.   Ct Chest W Contrast  01/20/2013   *RADIOLOGY REPORT*   Clinical Data: Chest x-ray with a possible left upper lobe nodule.  CT CHEST WITH CONTRAST  Technique:  Multidetector CT imaging of the chest was performed following the standard protocol during bolus administration of intravenous contrast.  Contrast: 80mL OMNIPAQUE IOHEXOL 300 MG/ML  SOLN  Comparison: PA and lateral chest 01/20/2013 and 1402 hours.  Findings: There is no axillary, hilar or mediastinal lymphadenopathy.  No pleural or pericardial effusion.  There are a few small foci of aortic and coronary atherosclerotic vascular disease.  No abnormality to correlate with finding on chest x-ray is seen.  There is a small ground-glass attenuation nodule measuring 0.6 cm in the left upper lobe on image 21.  Mild dependent atelectasis is also identified.  The lungs are otherwise unremarkable.  The airway appears normal.  Visualized upper abdomen is unremarkable.  No focal bony abnormality is identified with scattered thoracic spondylosis  noted.  IMPRESSION:  1.  No nodule correlate with finding on chest x-ray is identified but the patient does have a 0.6 cm subsolid nodule in the left upper lobe.  Initial follow-up by chest CT without contrast is recommended in 3 months to confirm persistence.   This recommendation follows the consensus statement: Recommendations for the Management of Subsolid Pulmonary Nodules Detected at CT:  A Statement from the [employer] as published in Radiology 2013; 266:304-317. 2.  Scattered, small foci of calcific aortic and coronary atherosclerosis.   Original Report Authenticated By: Holley Dexter, M.D.   1. Paresthesias     MDM   EKG NSR, no acute ischemic changes.  Trop negative.  CXR with suspicious nodular density vs artifact in LUL. Dedicated CT chest negative for pulmonary nodule- recommended repeat in 3 months for close monitoring.  CT head negative.  Labs with increased SrCr when compared with previous- possibly due to not taking HTN meds but will need to be  monitored.  Pt has previously scheduled FU with his PCP tomorrow in [place]-- encouraged to discuss this ED visit with him.  Copies of labs and imagining given to pt for physician review.  Discussed lab/imagining results and plan with pt, he acknowledged understanding and agreed.  Return precautions advised.  Discussed pt with Dr. Bernette Mayers who agrees with plan.   Garlon Hatchet, PA-C 01/20/13 2207  Garlon Hatchet, PA-C 01/20/13 2223

## 2013-01-21 NOTE — ED Provider Notes (Signed)
Medical screening examination/treatment/procedure(s) were performed by non-physician practitioner and as supervising physician I was immediately available for consultation/collaboration.   Suda Forbess B. Bernette Mayers, MD 01/21/13 2112

## 2013-08-12 ENCOUNTER — Emergency Department (HOSPITAL_COMMUNITY)
Admission: EM | Admit: 2013-08-12 | Discharge: 2013-08-12 | Disposition: A | Discharge status: Home / Self Care | Payer: Non-veteran care | Attending: Emergency Medicine | Admitting: Emergency Medicine

## 2013-08-12 ENCOUNTER — Emergency Department (HOSPITAL_COMMUNITY): Payer: Non-veteran care

## 2013-08-12 ENCOUNTER — Encounter (HOSPITAL_COMMUNITY): Payer: Self-pay | Admitting: Emergency Medicine

## 2013-08-12 DIAGNOSIS — S99919A Unspecified injury of unspecified ankle, initial encounter: Principal | ICD-10-CM

## 2013-08-12 DIAGNOSIS — F17200 Nicotine dependence, unspecified, uncomplicated: Secondary | ICD-10-CM | POA: Insufficient documentation

## 2013-08-12 DIAGNOSIS — G8929 Other chronic pain: Secondary | ICD-10-CM | POA: Insufficient documentation

## 2013-08-12 DIAGNOSIS — R011 Cardiac murmur, unspecified: Secondary | ICD-10-CM | POA: Insufficient documentation

## 2013-08-12 DIAGNOSIS — S99929A Unspecified injury of unspecified foot, initial encounter: Principal | ICD-10-CM

## 2013-08-12 DIAGNOSIS — Z9889 Other specified postprocedural states: Secondary | ICD-10-CM | POA: Insufficient documentation

## 2013-08-12 DIAGNOSIS — I10 Essential (primary) hypertension: Secondary | ICD-10-CM | POA: Insufficient documentation

## 2013-08-12 DIAGNOSIS — Z79899 Other long term (current) drug therapy: Secondary | ICD-10-CM | POA: Insufficient documentation

## 2013-08-12 DIAGNOSIS — Y9389 Activity, other specified: Secondary | ICD-10-CM | POA: Insufficient documentation

## 2013-08-12 DIAGNOSIS — X500XXA Overexertion from strenuous movement or load, initial encounter: Secondary | ICD-10-CM | POA: Insufficient documentation

## 2013-08-12 DIAGNOSIS — Y929 Unspecified place or not applicable: Secondary | ICD-10-CM | POA: Insufficient documentation

## 2013-08-12 DIAGNOSIS — S8990XA Unspecified injury of unspecified lower leg, initial encounter: Secondary | ICD-10-CM | POA: Insufficient documentation

## 2013-08-12 DIAGNOSIS — M25562 Pain in left knee: Secondary | ICD-10-CM

## 2013-08-12 IMAGING — CR DG KNEE COMPLETE 4+V*L*
4 series · 4 of 4 positions shown · non-contrast
Comparison: [DATE]

CLINICAL DATA: Pain post trauma

EXAM:
LEFT KNEE - COMPLETE 4+ VIEW

[t knee ap left]
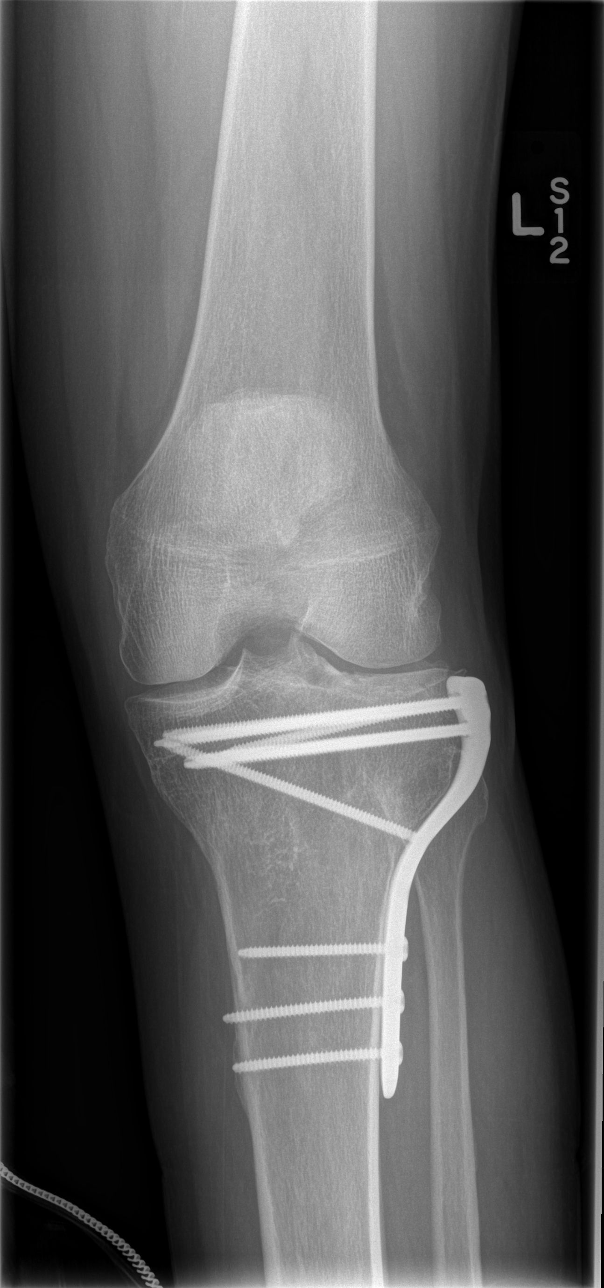

[t knee oblique left (1 of 2)]
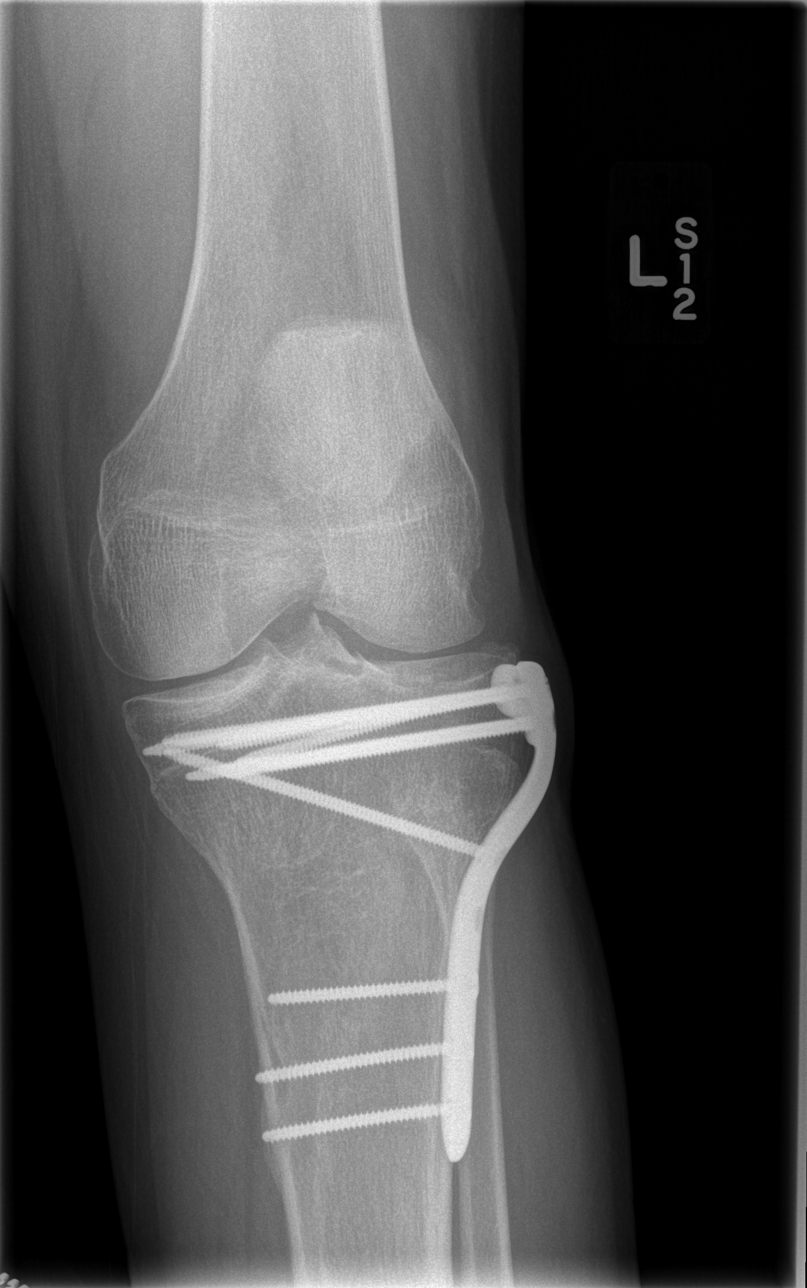

[t knee oblique left (2 of 2)]
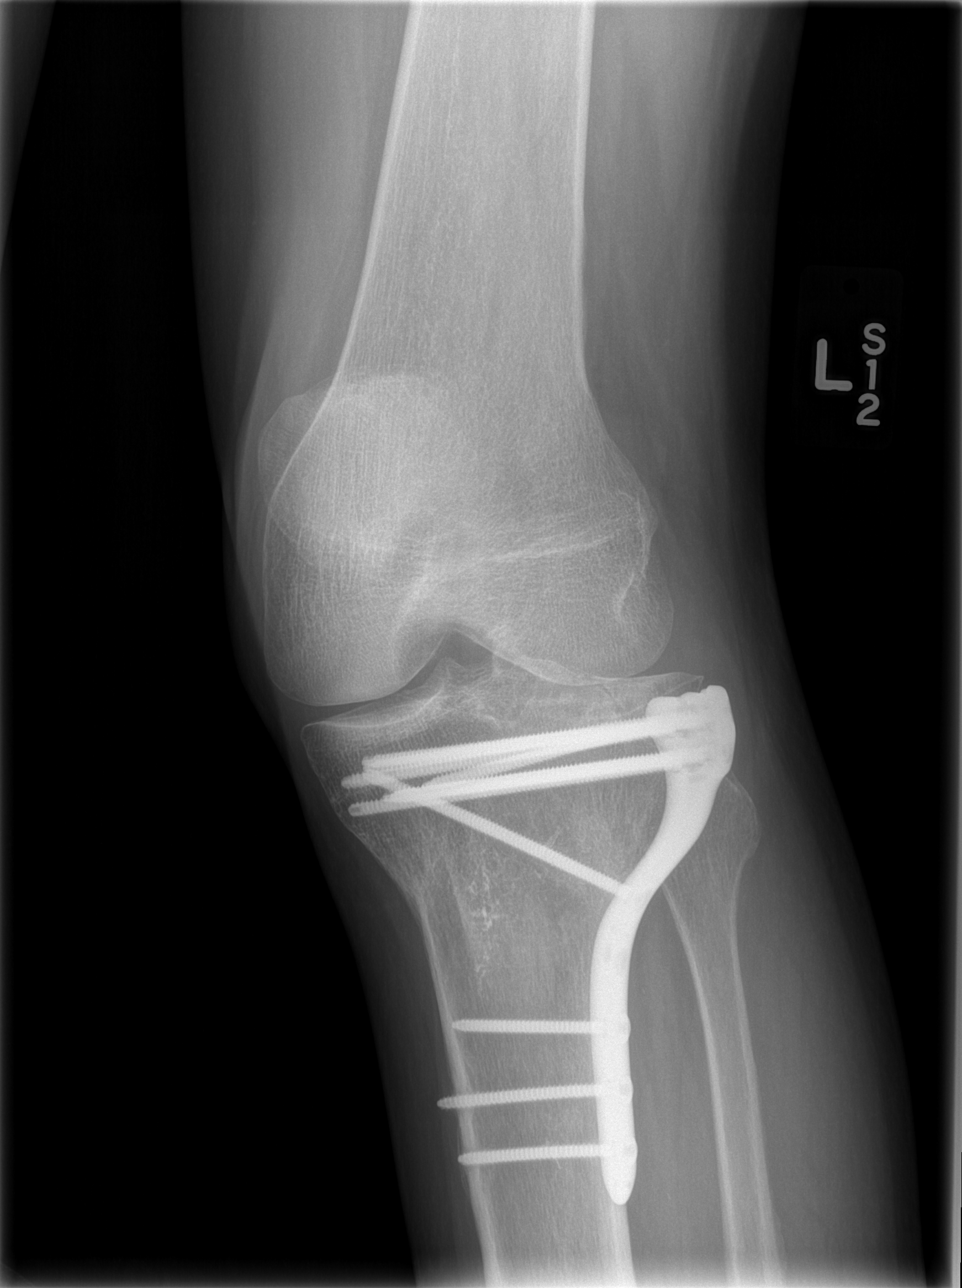

[t knee lat left]
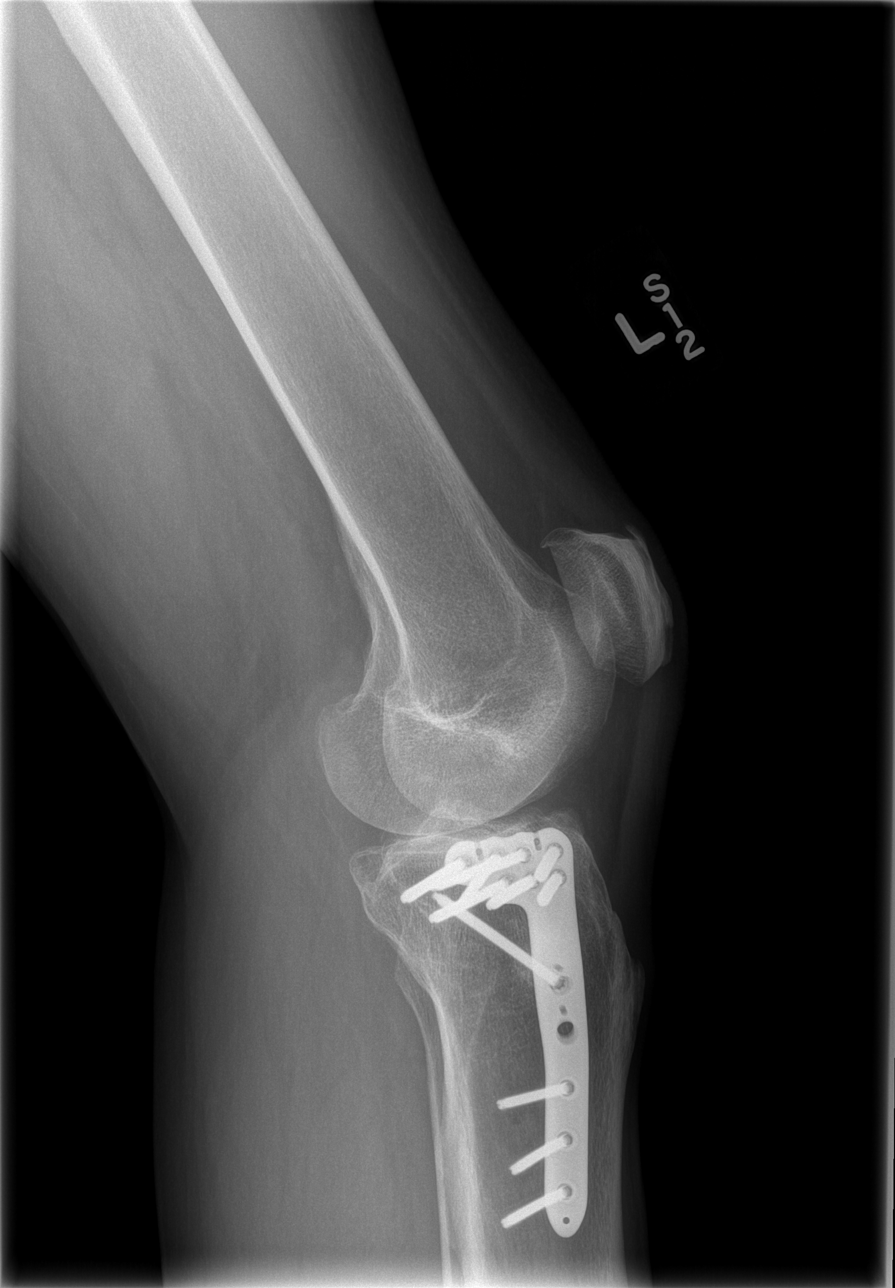

[4 of 4 positions shown; findings below may reference images not displayed]

FINDINGS: Frontal, lateral, and bilateral oblique views were obtained. There
is screw and plate fixation to the proximal tibia for prior
fractures. Alignment in these areas is anatomic. Currently, there is
no acute fracture or dislocation. No joint effusion. There are spurs
arising from the patella. There is mild joint space narrowing in the
medial and lateral compartments.
IMPRESSION: Postoperative change with fracture fixation. No acute fracture. Mild
osteoarthritic changes present. No joint effusion appreciable.

## 2013-08-12 MED ORDER — HYDROCODONE-ACETAMINOPHEN 5-325 MG PO TABS: 1.0000 | ORAL_TABLET | Freq: Four times a day (QID) | ORAL | Status: DC | PRN

## 2013-08-12 NOTE — ED Notes (Signed)
Patient states he heard "pop" when he was working out yesterday.   Chronic pain worsened by "pop" and tenderness over hardware location.

## 2013-08-12 NOTE — Discharge Instructions (Signed)
Be sure to read and understand instructions below prior to leaving the hospital. If your symptoms persist without any improvement in 1 week it is reccommended that you follow up with orthopedics listed above. Use your pain medication as prescribed and do not operate heavy machinery while on pain medication. Note that your pain medication contains acetaminophen (Tylenol) & its is not recommended that you use additional acetaminophen (Tylenol) while taking this medication.    TREATMENT  Rest, ice, elevation, and compression are the basic modes of treatment.   Apply ice to the sore area for 15 to 20 minutes, 3 to 4 times per day. Do this while you are awake for the first 2 days, or as directed. This can be stopped when the swelling goes away. Put the ice in a plastic bag and place a towel between the bag of ice and your skin.  Keep your leg elevated when possible to lessen swelling.  If your caregiver recommends crutches, use them as instructed for 1 week. Then, you may walk as tolerated.  Do not drive a vehicle on pain medication. ACTIVITY:            - Weight bearing as tolerated            - Exercises should be limited to pain free range of motion                SEEK MEDICAL CARE IF:  You have an increase in bruising, swelling, or pain.  Your toes feel cold.  Pain relief is not achieved with medications.  EMERGENCY:: Your toes are numb or blue or you have severe pain.  You notice redness, swelling, warmth or increasing pain in your knee.  An unexplained oral temperature above 102 F (38.9 C) develops.  COLD THERAPY DIRECTIONS:  Ice or gel packs can be used to reduce both pain and swelling. Ice is the most helpful within the first 24 to 48 hours after an injury or flareup from overusing a muscle or joint.  Ice is effective, has very few side effects, and is safe for most people to use.   If you expose your skin to cold temperatures for too long or without the proper protection, you can  damage your skin or nerves. Watch for signs of skin damage due to cold.   HOME CARE INSTRUCTIONS  Follow these tips to use ice and cold packs safely.  Place a dry or damp towel between the ice and skin. A damp towel will cool the skin more quickly, so you may need to shorten the time that the ice is used.  For a more rapid response, add gentle compression to the ice.  Ice for no more than 10 to 20 minutes at a time. The bonier the area you are icing, the less time it will take to get the benefits of ice.  Check your skin after 5 minutes to make sure there are no signs of a poor response to cold or skin damage.  Rest 20 minutes or more in between uses.  Once your skin is numb, you can end your treatment. You can test numbness by very lightly touching your skin. The touch should be so light that you do not see the skin dimple from the pressure of your fingertip. When using ice, most people will feel these normal sensations in this order: cold, burning, aching, and numbness.  Do not use ice on someone who cannot communicate their responses to pain, such as small  children or people with dementia.   HOW TO MAKE AN ICE PACK  To make an ice pack, do one of the following:  Place crushed ice or a bag of frozen vegetables in a sealable plastic bag. Squeeze out the excess air. Place this bag inside another plastic bag. Slide the bag into a pillowcase or place a damp towel between your skin and the bag.  Mix 3 parts water with 1 part rubbing alcohol. Freeze the mixture in a sealable plastic bag. When you remove the mixture from the freezer, it will be slushy. Squeeze out the excess air. Place this bag inside another plastic bag. Slide the bag into a pillowcase or place a damp towel between your s
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
## 2013-08-12 NOTE — ED Provider Notes (Signed)
  Medical screening examination/treatment/procedure(s) were performed by non-physician practitioner and as supervising physician I was immediately available for consultation/collaboration.      Carmin Muskrat, MD 08/12/13 1600
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
## 2013-08-20 ENCOUNTER — Ambulatory Visit: Payer: Non-veteran care

## 2013-10-28 ENCOUNTER — Emergency Department (HOSPITAL_COMMUNITY): Payer: Non-veteran care

## 2013-10-28 ENCOUNTER — Encounter (HOSPITAL_COMMUNITY): Payer: Self-pay | Admitting: Emergency Medicine

## 2013-10-28 ENCOUNTER — Emergency Department (HOSPITAL_COMMUNITY)
Admission: EM | Admit: 2013-10-28 | Discharge: 2013-10-28 | Disposition: A | Discharge status: Home / Self Care | Payer: Non-veteran care | Attending: Emergency Medicine | Admitting: Emergency Medicine

## 2013-10-28 DIAGNOSIS — F17200 Nicotine dependence, unspecified, uncomplicated: Secondary | ICD-10-CM | POA: Insufficient documentation

## 2013-10-28 DIAGNOSIS — R011 Cardiac murmur, unspecified: Secondary | ICD-10-CM | POA: Insufficient documentation

## 2013-10-28 DIAGNOSIS — G8929 Other chronic pain: Secondary | ICD-10-CM | POA: Insufficient documentation

## 2013-10-28 DIAGNOSIS — Z79899 Other long term (current) drug therapy: Secondary | ICD-10-CM | POA: Insufficient documentation

## 2013-10-28 DIAGNOSIS — I10 Essential (primary) hypertension: Secondary | ICD-10-CM | POA: Insufficient documentation

## 2013-10-28 DIAGNOSIS — N390 Urinary tract infection, site not specified: Secondary | ICD-10-CM | POA: Insufficient documentation

## 2013-10-28 LAB — URINALYSIS, ROUTINE W REFLEX MICROSCOPIC
Glucose, UA: NEGATIVE mg/dL
Ketones, ur: 15 mg/dL — AB
Nitrite: NEGATIVE
Protein, ur: NEGATIVE mg/dL
Specific Gravity, Urine: 1.03 (ref 1.005–1.030)
Urobilinogen, UA: 1 mg/dL (ref 0.0–1.0)
pH: 5.5 (ref 5.0–8.0)

## 2013-10-28 LAB — BASIC METABOLIC PANEL
BUN: 13 mg/dL (ref 6–23)
CO2: 23 mEq/L (ref 19–32)
Calcium: 9.4 mg/dL (ref 8.4–10.5)
Chloride: 104 mEq/L (ref 96–112)
Creatinine, Ser: 1.37 mg/dL — ABNORMAL HIGH (ref 0.50–1.35)
GFR calc Af Amer: 64 mL/min — ABNORMAL LOW (ref >90)
GFR calc non Af Amer: 55 mL/min — ABNORMAL LOW (ref >90)
Glucose, Bld: 97 mg/dL (ref 70–99)
Potassium: 4.2 mEq/L (ref 3.7–5.3)
Sodium: 140 mEq/L (ref 137–147)

## 2013-10-28 LAB — CBC WITH DIFFERENTIAL/PLATELET
Basophils Absolute: 0.1 10*3/uL (ref 0.0–0.1)
Basophils Relative: 1 % (ref 0–1)
Eosinophils Absolute: 0.1 10*3/uL (ref 0.0–0.7)
Eosinophils Relative: 2 % (ref 0–5)
HCT: 45.2 % (ref 39.0–52.0)
Hemoglobin: 16.2 g/dL (ref 13.0–17.0)
Lymphocytes Relative: 24 % (ref 12–46)
Lymphs Abs: 1.5 10*3/uL (ref 0.7–4.0)
MCH: 30.4 pg (ref 26.0–34.0)
MCHC: 35.8 g/dL (ref 30.0–36.0)
MCV: 84.8 fL (ref 78.0–100.0)
Monocytes Absolute: 0.6 10*3/uL (ref 0.1–1.0)
Monocytes Relative: 11 % (ref 3–12)
Neutro Abs: 3.8 10*3/uL (ref 1.7–7.7)
Neutrophils Relative %: 62 % (ref 43–77)
Platelets: 195 10*3/uL (ref 150–400)
RBC: 5.33 MIL/uL (ref 4.22–5.81)
RDW: 13.4 % (ref 11.5–15.5)
WBC: 6.1 10*3/uL (ref 4.0–10.5)

## 2013-10-28 LAB — URINE MICROSCOPIC-ADD ON

## 2013-10-28 IMAGING — CT CT ABD-PELV W/O CM
2 of 4 series · 16 of 46 positions shown, 18 images · non-contrast
Comparison: None.

CLINICAL DATA: Right flank pain

EXAM:
CT ABDOMEN AND PELVIS WITHOUT CONTRAST
TECHNIQUE: Multidetector CT imaging of the abdomen and pelvis was performed
following the standard protocol without intravenous contrast.

[Series 2: stone study 5.0 i30f 1 · axial · 0.75mm/px · z∈[+836,+1250]mm · 13 of 91 slices shown, 15 images]
[im 4/91  soft-tissue]
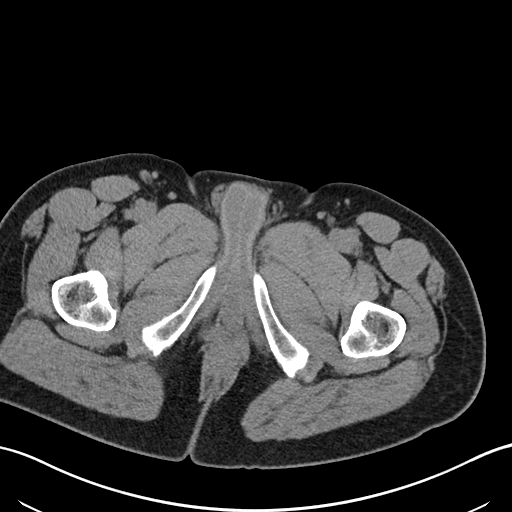
[im 4/91  bone]
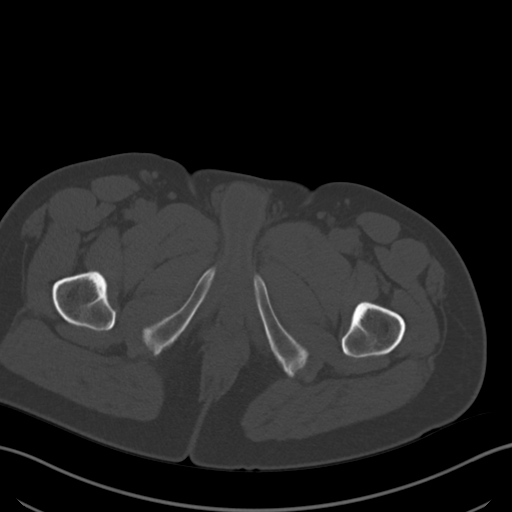
[im 11/91  soft-tissue]
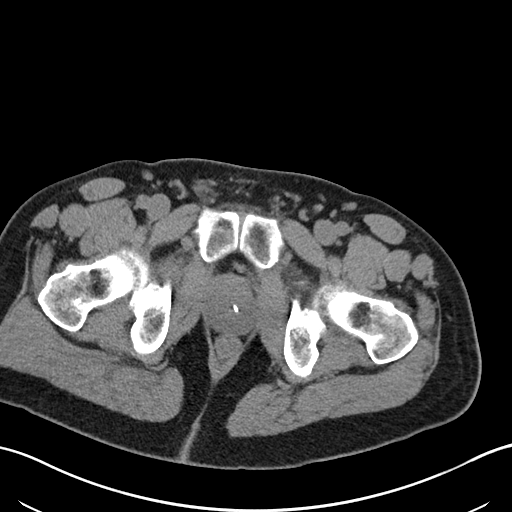
[im 19/91  soft-tissue]
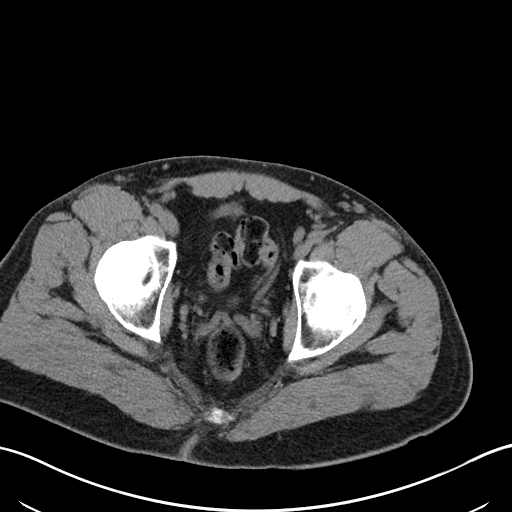
[im 26/91  soft-tissue]
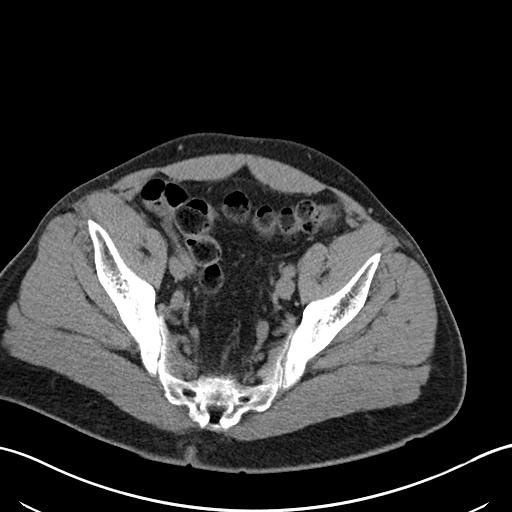
[im 33/91  soft-tissue]
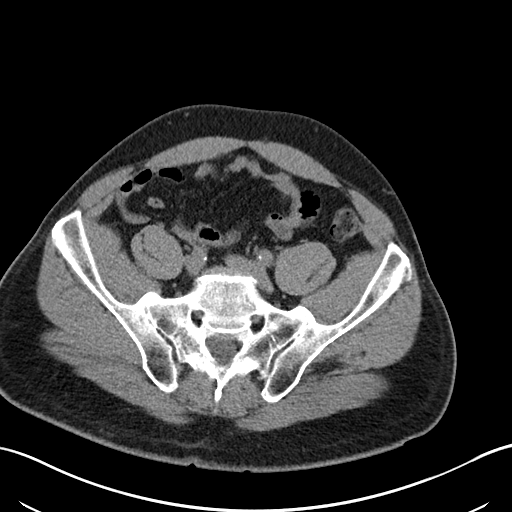
[im 40/91  soft-tissue]
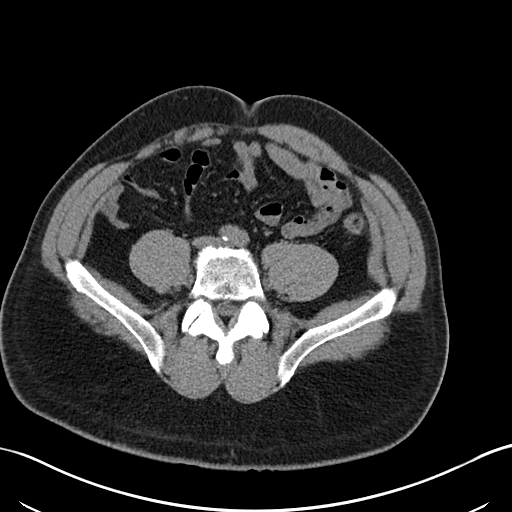
[im 47/91  soft-tissue]
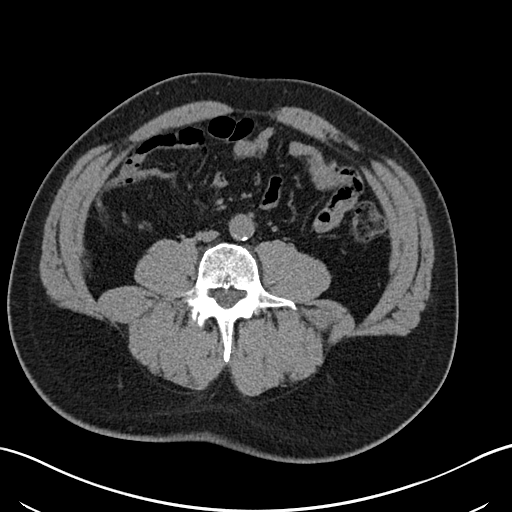
[im 51/91  soft-tissue]
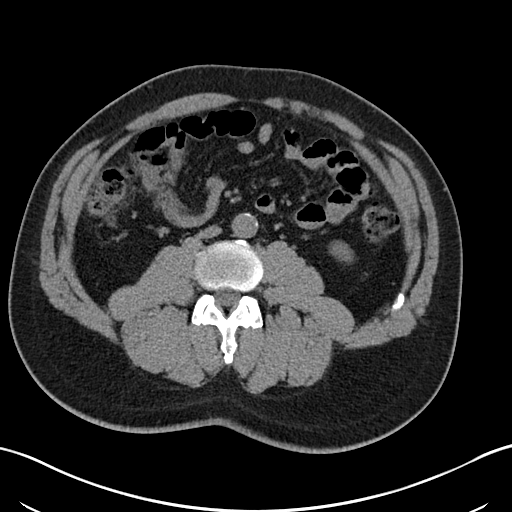
[im 58/91  soft-tissue]
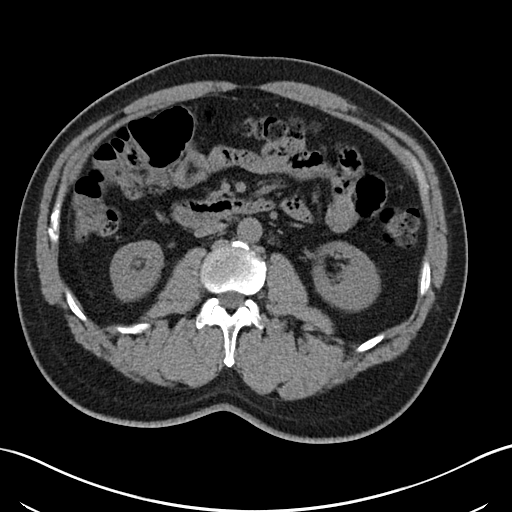
[im 58/91  bone]
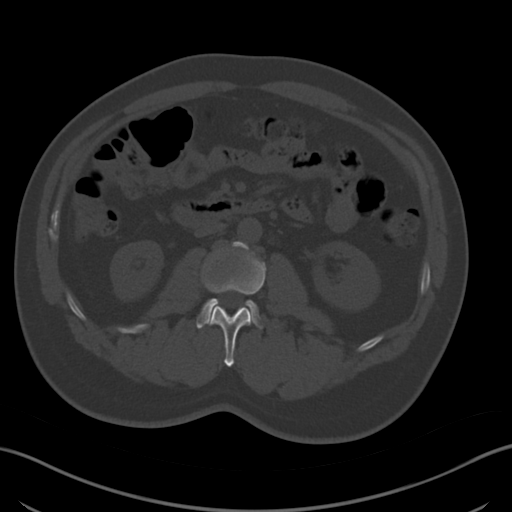
[im 65/91  soft-tissue]
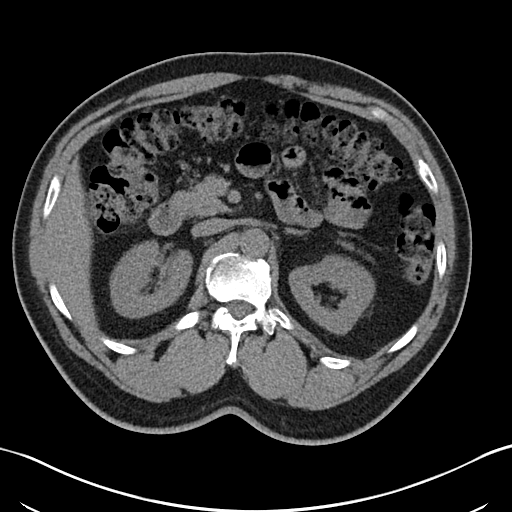
[im 73/91  soft-tissue]
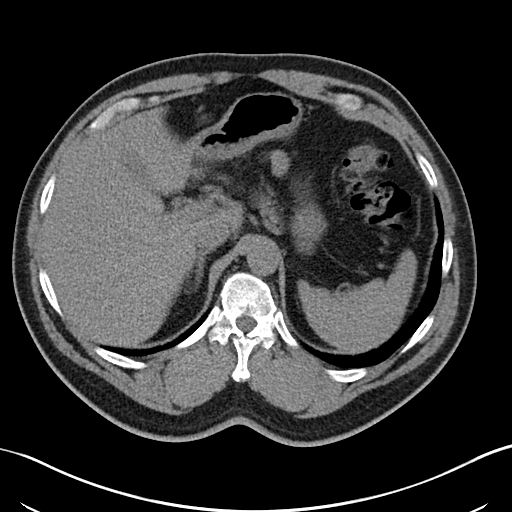
[im 80/91  soft-tissue]
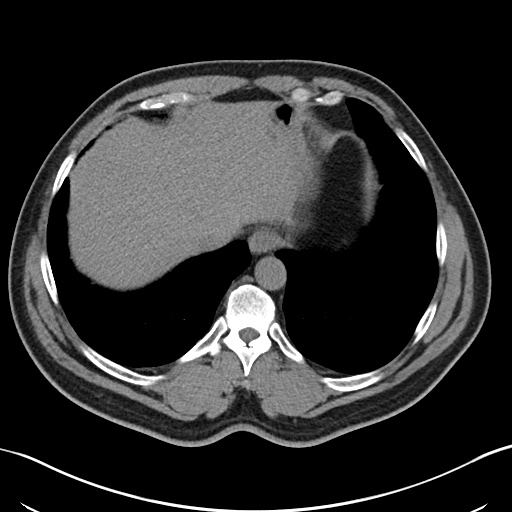
[im 87/91  soft-tissue]
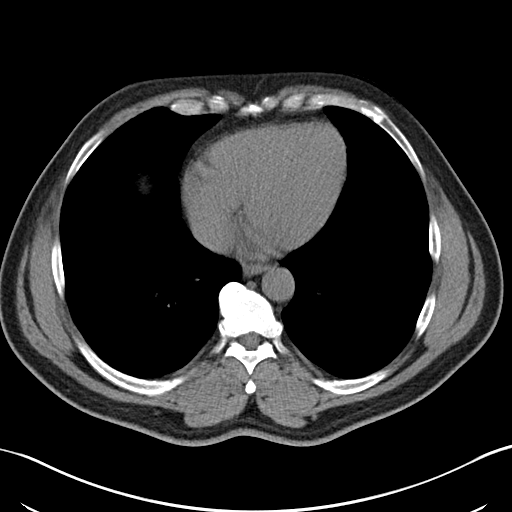

[Series 5: coronal soft tissue · coronal · 0.97mm/px · 3 of 101 slices shown]
[im 34/101  soft-tissue]
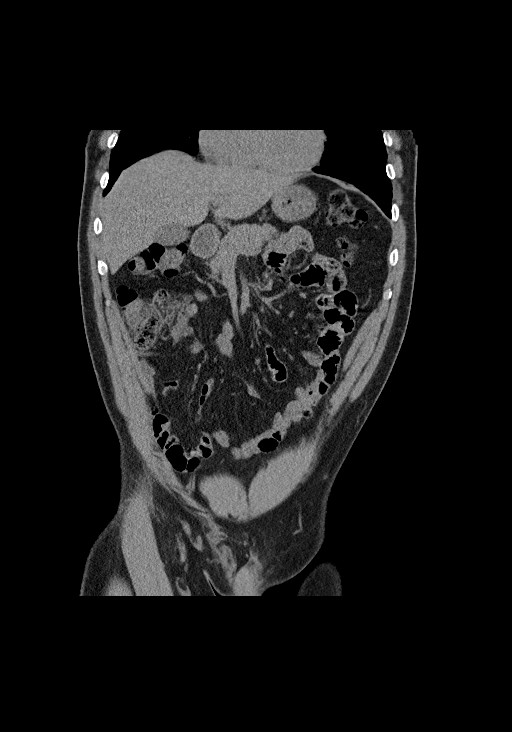
[im 45/101  soft-tissue]
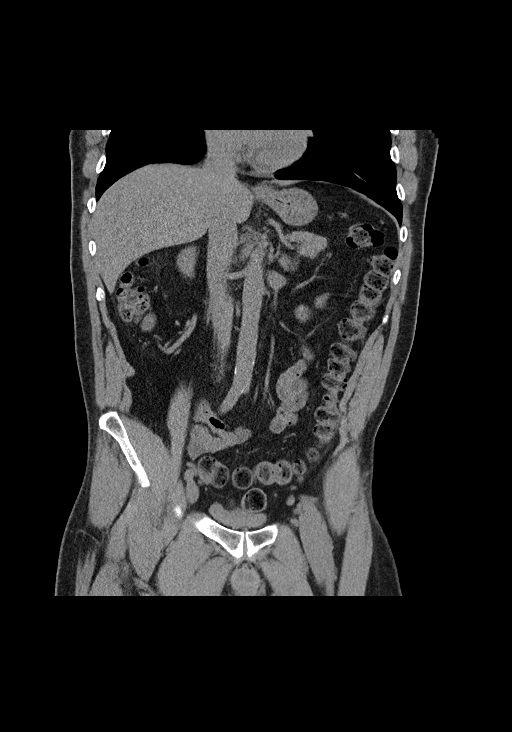
[im 56/101  soft-tissue]
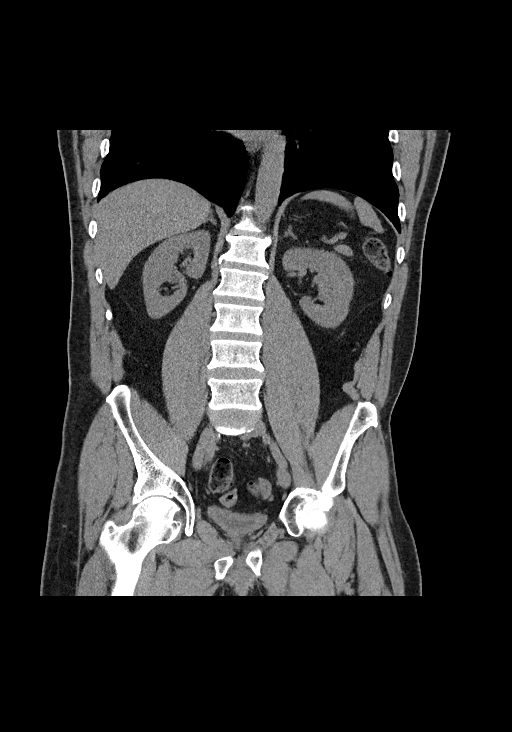

[16 of 46 positions shown; findings below may reference images not displayed]

FINDINGS: Lung bases are clear.  Heart size is normal.

Liver gallbladder and bile ducts are normal. Pancreas and spleen are
normal.

The kidneys are normal. No renal calculi. No renal obstruction or
mass. The bladder is normal. Prostate is mildly enlarged with coarse
calcification on the left.

Negative for bowel obstruction or bowel thickening. Appendix is
normal. No free fluid. Negative for mass or adenopathy.
IMPRESSION: No renal calculi or obstruction.

Normal appendix.

## 2013-10-28 MED ORDER — SODIUM CHLORIDE 0.9 % IV SOLN
INTRAVENOUS | Status: DC
  Administered 2013-10-28: 20 mL/h via INTRAVENOUS

## 2013-10-28 MED ORDER — CEPHALEXIN 500 MG PO CAPS: 500.0000 mg | ORAL_CAPSULE | Freq: Four times a day (QID) | ORAL | Status: DC

## 2013-10-28 MED ORDER — HYDROCODONE-ACETAMINOPHEN 7.5-300 MG PO TABS: 1.0000 | ORAL_TABLET | Freq: Four times a day (QID) | ORAL | Status: DC | PRN

## 2013-10-28 NOTE — ED Provider Notes (Addendum)
CSN: [id number]     Arrival date & time 10/28/13  1142 History   First MD Initiated Contact with Patient 10/28/13 1206     Chief Complaint  Patient presents with  . Back Pain     (Consider location/radiation/quality/duration/timing/severity/associated sxs/prior Treatment) Patient is a 58 y.o. male presenting with back pain. The history is provided by the patient.  Back Pain  patient here with 3 weeks of right-sided colicky flank pain without known injury. Denies any radicular symptoms. No saddle anesthesias. He denies any dyspnea. No abdominal pain. No vomiting or diarrhea.. He also notes 5 years of intermittent hematuria. Has been seen at the [facility] clinic for this without a diagnosis. He does note increasing dark urine at this time. Symptoms have been persistent. No treatment used prior to arrival. Pain is worse with certain positions and better with remaining still.  Past Medical History  Diagnosis Date  . Hypertension   . Chronic pain disorder     LT leg and foot  . Heart murmur    Past Surgical History  Procedure Laterality Date  . Leg surgery Left     PT reports he has pins placed in his Lt leg  . Orif tibia plateau  10/09/2012    Dr Lorin Mercy  . Orif tibia plateau Left 10/08/2012    Procedure: OPEN REDUCTION INTERNAL FIXATION (ORIF) TIBIAL PLATEAU;  Surgeon: Marybelle Killings, MD;  Location: [place];  Service: Orthopedics;  Laterality: Left;   History reviewed. No pertinent family history. History  Substance Use Topics  . Smoking status: Current Every Day Smoker -- 28 years    Types: Cigarettes    Last Attempt to Quit: 10/07/2012  . Smokeless tobacco: Never Used  . Alcohol Use: No    Review of Systems  Musculoskeletal: Positive for back pain.  All other systems reviewed and are negative.      Allergies  Review of patient's allergies indicates no known allergies.  Home Medications   Current Outpatient Rx  Name  Route  Sig  Dispense  Refill  . acetaminophen (TYLENOL) 500 MG  tablet   Oral   Take 1,000 mg by mouth every 6 (six) hours as needed.         Marland Kitchen HYDROcodone-acetaminophen (NORCO/VICODIN) 5-325 MG per tablet   Oral   Take 1-2 tablets by mouth every 6 (six) hours as needed for moderate pain.         Marland Kitchen loratadine (CLARITIN) 10 MG tablet   Oral   Take 10 mg by mouth daily as needed for allergies.         . Multiple Vitamins-Minerals (MULTIVITAMIN PO)   Oral   Take 1 tablet by mouth daily.         Marland Kitchen PRESCRIPTION MEDICATION   Oral   Take 0.5 tablets by mouth daily. Blood pressure medication          BP 191/102  Pulse 89  Temp(Src) 98 F (36.7 C) (Oral)  Resp 18  Ht 6\' 2"  (1.88 m)  Wt 207 lb 8 oz (94.121 kg)  BMI 26.63 kg/m2  SpO2 100% Physical Exam  Nursing note and vitals reviewed. Constitutional: He is oriented to person, place, and time. He appears well-developed and well-nourished.  Non-toxic appearance. No distress.  HENT:  Head: Normocephalic and atraumatic.  Eyes: Conjunctivae, EOM and lids are normal. Pupils are equal, round, and reactive to light.  Neck: Normal range of motion. Neck supple. No tracheal deviation present. No mass present.  Cardiovascular:  Normal rate, regular rhythm and normal heart sounds.  Exam reveals no gallop.   No murmur heard. Pulmonary/Chest: Effort normal and breath sounds normal. No stridor. No respiratory distress. He has no decreased breath sounds. He has no wheezes. He has no rhonchi. He has no rales.  Abdominal: Soft. Normal appearance and bowel sounds are normal. He exhibits no distension. There is no tenderness. There is no rebound and no CVA tenderness.  Musculoskeletal: Normal range of motion. He exhibits no edema and no tenderness.       Back:  Neurological: He is alert and oriented to person, place, and time. He has normal strength. No cranial nerve deficit or sensory deficit. GCS eye subscore is 4. GCS verbal subscore is 5. GCS motor subscore is 6.  Skin: Skin is warm and dry. No  abrasion and no rash noted.  Psychiatric: He has a normal mood and affect. His speech is normal and behavior is normal.    ED Course  Procedures (including critical care time) Labs Review Labs Reviewed  CBC WITH DIFFERENTIAL  BASIC METABOLIC PANEL  URINALYSIS, ROUTINE W REFLEX MICROSCOPIC   Imaging Review No results found.   EKG Interpretation None      MDM   Final diagnoses:  None    Patient to be treated for urinary tract infection and will followup at the [facility] clinic. Patient also instructed to have his blood pressure rechecked at the [facility] clinic.    Leota Jacobsen, MD 10/28/13 1448  Leota Jacobsen, MD 10/28/13 [phone]

## 2013-10-28 NOTE — ED Notes (Signed)
Pt reports dark urine intermittently.  No noted back injury

## 2013-10-28 NOTE — ED Notes (Addendum)
Pt reports right side lower back pain x 3 weeks, no injury to back, denies urinary symptoms. Ambulatory at triage. Hypertensive at triage, reports that he did not take his meds today.

## 2013-10-28 NOTE — Discharge Instructions (Signed)
Arterial Hypertension °Arterial hypertension (high blood pressure) is a condition of elevated pressure in your blood vessels. Hypertension over a long period of time is a risk factor for strokes, heart attacks, and heart failure. It is also the leading cause of kidney (renal) failure.  °CAUSES  °· In Adults -- Over 90% of all hypertension has no known cause. This is called essential or primary hypertension. In the other 10% of people with hypertension, the increase in blood pressure is caused by another disorder. This is called secondary hypertension. Important causes of secondary hypertension are: °· Heavy alcohol use. °· Obstructive sleep apnea. °· Hyperaldosterosim (Conn's syndrome). °· Steroid use. °· Chronic kidney failure. °· Hyperparathyroidism. °· Medications. °· Renal artery stenosis. °· Pheochromocytoma. °· Cushing's disease. °· Coarctation of the aorta. °· Scleroderma renal crisis. °· Licorice (in excessive amounts). °· Drugs (cocaine, methamphetamine). °Your caregiver can explain any items above that apply to you. °· In Children -- Secondary hypertension is more common and should always be considered. °· Pregnancy -- Few women of childbearing age have high blood pressure. However, up to 10% of them develop hypertension of pregnancy. Generally, this will not harm the woman. It may be a sign of 3 complications of pregnancy: preeclampsia, HELLP syndrome, and eclampsia. Follow up and control with medication is necessary. °SYMPTOMS  °· This condition normally does not produce any noticeable symptoms. It is usually found during a routine exam. °· Malignant hypertension is a late problem of high blood pressure. It may have the following symptoms: °· Headaches. °· Blurred vision. °· End-organ damage (this means your kidneys, heart, lungs, and other organs are being damaged). °· Stressful situations can increase the blood pressure. If a person with normal blood pressure has their blood pressure go up while being  seen by their caregiver, this is often termed "white coat hypertension." Its importance is not known. It may be related with eventually developing hypertension or complications of hypertension. °· Hypertension is often confused with mental tension, stress, and anxiety. °DIAGNOSIS  °The diagnosis is made by 3 separate blood pressure measurements. They are taken at least 1 week apart from each other. If there is organ damage from hypertension, the diagnosis may be made without repeat measurements. °Hypertension is usually identified by having blood pressure readings: °· Above 140/90 mmHg measured in both arms, at 3 separate times, over a couple weeks. °· Over 130/80 mmHg should be considered a risk factor and may require treatment in patients with diabetes. °Blood pressure readings over 120/80 mmHg are called "pre-hypertension" even in non-diabetic patients. °To get a true blood pressure measurement, use the following guidelines. Be aware of the factors that can alter blood pressure readings. °· Take measurements at least 1 hour after caffeine. °· Take measurements 30 minutes after smoking and without any stress. This is another reason to quit smoking  it raises your blood pressure. °· Use a proper cuff size. Ask your caregiver if you are not sure about your cuff size. °· Most home blood pressure cuffs are automatic. They will measure systolic and diastolic pressures. The systolic pressure is the pressure reading at the start of sounds. Diastolic pressure is the pressure at which the sounds disappear. If you are elderly, measure pressures in multiple postures. Try sitting, lying or standing. °· Sit at rest for a minimum of 5 minutes before taking measurements. °· You should not be on any medications like decongestants. These are found in many cold medications. °· Record your blood pressure readings and review   them with your caregiver. °If you have hypertension: °· Your caregiver may do tests to be sure you do not have  secondary hypertension (see "causes" above). °· Your caregiver may also look for signs of metabolic syndrome. This is also called Syndrome X or Insulin Resistance Syndrome. You may have this syndrome if you have type 2 diabetes, abdominal obesity, and abnormal blood lipids in addition to hypertension. °· Your caregiver will take your medical and family history and perform a physical exam. °· Diagnostic tests may include blood tests (for glucose, cholesterol, potassium, and kidney function), a urinalysis, or an EKG. Other tests may also be necessary depending on your condition. °PREVENTION  °There are important lifestyle issues that you can adopt to reduce your chance of developing hypertension: °· Maintain a normal weight. °· Limit the amount of salt (sodium) in your diet. °· Exercise often. °· Limit alcohol intake. °· Get enough potassium in your diet. Discuss specific advice with your caregiver. °· Follow a DASH diet (dietary approaches to stop hypertension). This diet is rich in fruits, vegetables, and low-fat dairy products, and avoids certain fats. °PROGNOSIS  °Essential hypertension cannot be cured. Lifestyle changes and medical treatment can lower blood pressure and reduce complications. The prognosis of secondary hypertension depends on the underlying cause. Many people whose hypertension is controlled with medicine or lifestyle changes can live a normal, healthy life.  °RISKS AND COMPLICATIONS  °While high blood pressure alone is not an illness, it often requires treatment due to its short- and long-term effects on many organs. Hypertension increases your risk for: °· CVAs or strokes (cerebrovascular accident). °· Heart failure due to chronically high blood pressure (hypertensive cardiomyopathy). °· Heart attack (myocardial infarction). °· Damage to the retina (hypertensive retinopathy). °· Kidney failure (hypertensive nephropathy). °Your caregiver can explain list items above that apply to you. Treatment  of hypertension can significantly reduce the risk of complications. °TREATMENT  °· For overweight patients, weight loss and regular exercise are recommended. Physical fitness lowers blood pressure. °· Mild hypertension is usually treated with diet and exercise. A diet rich in fruits and vegetables, fat-free dairy products, and foods low in fat and salt (sodium) can help lower blood pressure. Decreasing salt intake decreases blood pressure in a 1/3 of people. °· Stop smoking if you are a smoker. °The steps above are highly effective in reducing blood pressure. While these actions are easy to suggest, they are difficult to achieve. Most patients with moderate or severe hypertension end up requiring medications to bring their blood pressure down to a normal level. There are several classes of medications for treatment. Blood pressure pills (antihypertensives) will lower blood pressure by their different actions. Lowering the blood pressure by 10 mmHg may decrease the risk of complications by as much as 25%. °The goal of treatment is effective blood pressure control. This will reduce your risk for complications. Your caregiver will help you determine the best treatment for you according to your lifestyle. What is excellent treatment for one person, may not be for you. °HOME CARE INSTRUCTIONS  °· Do not smoke. °· Follow the lifestyle changes outlined in the "Prevention" section. °· If you are on medications, follow the directions carefully. Blood pressure medications must be taken as prescribed. Skipping doses reduces their benefit. It also puts you at risk for problems. °· Follow up with your caregiver, as directed. °· If you are asked to monitor your blood pressure at home, follow the guidelines in the "Diagnosis" section above. °SEEK MEDICAL CARE   IF:   You think you are having medication side effects.  You have recurrent headaches or lightheadedness.  You have swelling in your ankles.  You have trouble with  your vision. SEEK IMMEDIATE MEDICAL CARE IF:   You have sudden onset of chest pain or pressure, difficulty breathing, or other symptoms of a heart attack.  You have a severe headache.  You have symptoms of a stroke (such as sudden weakness, difficulty speaking, difficulty walking). MAKE SURE YOU:   Understand these instructions.  Will watch your condition.  Will get help right away if you are not doing well or get worse. Document Released: 07/10/2005 Document Revised: 10/02/2011 Document Reviewed: 02/07/2007 [facility] Patient Information 2014 [place]. Urinary Tract Infection Urinary tract infections (UTIs) can develop anywhere along your urinary tract. Your urinary tract is your body's drainage system for removing wastes and extra water. Your urinary tract includes two kidneys, two ureters, a bladder, and a urethra. Your kidneys are a pair of bean-shaped organs. Each kidney is about the size of your fist. They are located below your ribs, one on each side of your spine. CAUSES Infections are caused by microbes, which are microscopic organisms, including fungi, viruses, and bacteria. These organisms are so small that they can only be seen through a microscope. Bacteria are the microbes that most commonly cause UTIs. SYMPTOMS  Symptoms of UTIs may vary by age and gender of the patient and by the location of the infection. Symptoms in young women typically include a frequent and intense urge to urinate and a painful, burning feeling in the bladder or urethra during urination. Older women and men are more likely to be tired, shaky, and weak and have muscle aches and abdominal pain. A fever may mean the infection is in your kidneys. Other symptoms of a kidney infection include pain in your back or sides below the ribs, nausea, and vomiting. DIAGNOSIS To diagnose a UTI, your caregiver will ask you about your symptoms. Your caregiver also will ask to provide a urine sample. The urine sample  will be tested for bacteria and white blood cells. White blood cells are made by your body to help fight infection. TREATMENT  Typically, UTIs can be treated with medication. Because most UTIs are caused by a bacterial infection, they usually can be treated with the use of antibiotics. The choice of antibiotic and length of treatment depend on your symptoms and the type of bacteria causing your infection. HOME CARE INSTRUCTIONS  If you were prescribed antibiotics, take them exactly as your caregiver instructs you. Finish the medication even if you feel better after you have only taken some of the medication.  Drink enough water and fluids to keep your urine clear or pale yellow.  Avoid caffeine, tea, and carbonated beverages. They tend to irritate your bladder.  Empty your bladder often. Avoid holding urine for long periods of time.  Empty your bladder before and after sexual intercourse.  After a bowel movement, women should cleanse from front to back. Use each tissue only once. SEEK MEDICAL CARE IF:   You have back pain.  You develop a fever.  Your symptoms do not begin to resolve within 3 days. SEEK IMMEDIATE MEDICAL CARE IF:   You have severe back pain or lower abdominal pain.  You develop chills.  You have nausea or vomiting.  You have continued burning or discomfort with urination. MAKE SURE YOU:   Understand these instructions.  Will watch your condition.  Will get help  right away if you are not doing well or get worse. Document Released: 04/19/2005 Document Revised: 01/09/2012 Document Reviewed: 08/18/2011 [facility] Patient Information 2014 [place].

## 2013-11-11 ENCOUNTER — Encounter (HOSPITAL_COMMUNITY): Payer: Self-pay | Admitting: Emergency Medicine

## 2013-11-11 ENCOUNTER — Emergency Department (HOSPITAL_COMMUNITY)
Admission: EM | Admit: 2013-11-11 | Discharge: 2013-11-11 | Disposition: A | Discharge status: Home / Self Care | Payer: Non-veteran care | Attending: Emergency Medicine | Admitting: Emergency Medicine

## 2013-11-11 DIAGNOSIS — Z79899 Other long term (current) drug therapy: Secondary | ICD-10-CM | POA: Insufficient documentation

## 2013-11-11 DIAGNOSIS — I10 Essential (primary) hypertension: Secondary | ICD-10-CM | POA: Insufficient documentation

## 2013-11-11 DIAGNOSIS — G894 Chronic pain syndrome: Secondary | ICD-10-CM | POA: Insufficient documentation

## 2013-11-11 DIAGNOSIS — R011 Cardiac murmur, unspecified: Secondary | ICD-10-CM | POA: Insufficient documentation

## 2013-11-11 DIAGNOSIS — R197 Diarrhea, unspecified: Secondary | ICD-10-CM | POA: Insufficient documentation

## 2013-11-11 DIAGNOSIS — F17200 Nicotine dependence, unspecified, uncomplicated: Secondary | ICD-10-CM | POA: Insufficient documentation

## 2013-11-11 DIAGNOSIS — R109 Unspecified abdominal pain: Secondary | ICD-10-CM | POA: Insufficient documentation

## 2013-11-11 LAB — URINALYSIS, ROUTINE W REFLEX MICROSCOPIC
Bilirubin Urine: NEGATIVE
Glucose, UA: NEGATIVE mg/dL
Ketones, ur: NEGATIVE mg/dL
Leukocytes, UA: NEGATIVE
Nitrite: NEGATIVE
Protein, ur: NEGATIVE mg/dL
Specific Gravity, Urine: 1.024 (ref 1.005–1.030)
Urobilinogen, UA: 1 mg/dL (ref 0.0–1.0)
pH: 5.5 (ref 5.0–8.0)

## 2013-11-11 LAB — CBC WITH DIFFERENTIAL/PLATELET
Basophils Absolute: 0.1 10*3/uL (ref 0.0–0.1)
Basophils Relative: 1 % (ref 0–1)
Eosinophils Absolute: 0.3 10*3/uL (ref 0.0–0.7)
Eosinophils Relative: 3 % (ref 0–5)
HCT: 44.6 % (ref 39.0–52.0)
Hemoglobin: 16 g/dL (ref 13.0–17.0)
Lymphocytes Relative: 28 % (ref 12–46)
Lymphs Abs: 2.4 10*3/uL (ref 0.7–4.0)
MCH: 30.6 pg (ref 26.0–34.0)
MCHC: 35.9 g/dL (ref 30.0–36.0)
MCV: 85.3 fL (ref 78.0–100.0)
Monocytes Absolute: 0.6 10*3/uL (ref 0.1–1.0)
Monocytes Relative: 7 % (ref 3–12)
Neutro Abs: 5.2 10*3/uL (ref 1.7–7.7)
Neutrophils Relative %: 61 % (ref 43–77)
Platelets: 208 10*3/uL (ref 150–400)
RBC: 5.23 MIL/uL (ref 4.22–5.81)
RDW: 13.3 % (ref 11.5–15.5)
WBC: 8.6 10*3/uL (ref 4.0–10.5)

## 2013-11-11 LAB — COMPREHENSIVE METABOLIC PANEL
ALT: 22 U/L (ref 0–53)
AST: 19 U/L (ref 0–37)
Albumin: 3.9 g/dL (ref 3.5–5.2)
Alkaline Phosphatase: 107 U/L (ref 39–117)
BUN: 18 mg/dL (ref 6–23)
CO2: 22 mEq/L (ref 19–32)
Calcium: 9.8 mg/dL (ref 8.4–10.5)
Chloride: 104 mEq/L (ref 96–112)
Creatinine, Ser: 1.38 mg/dL — ABNORMAL HIGH (ref 0.50–1.35)
GFR calc Af Amer: 64 mL/min — ABNORMAL LOW (ref >90)
GFR calc non Af Amer: 55 mL/min — ABNORMAL LOW (ref >90)
Glucose, Bld: 133 mg/dL — ABNORMAL HIGH (ref 70–99)
Potassium: 4.3 mEq/L (ref 3.7–5.3)
Sodium: 139 mEq/L (ref 137–147)
Total Bilirubin: 0.3 mg/dL (ref 0.3–1.2)
Total Protein: 7.2 g/dL (ref 6.0–8.3)

## 2013-11-11 LAB — URINE MICROSCOPIC-ADD ON

## 2013-11-11 MED ORDER — HYDROCODONE-ACETAMINOPHEN 5-325 MG PO TABS: 1.0000 | ORAL_TABLET | Freq: Four times a day (QID) | ORAL | Status: DC | PRN

## 2013-11-11 MED ORDER — HYDROCODONE-ACETAMINOPHEN 5-325 MG PO TABS
2.0000 | ORAL_TABLET | Freq: Once | ORAL | Status: AC
  Administered 2013-11-11: 2 via ORAL
  Filled 2013-11-11: qty 2

## 2013-11-11 NOTE — ED Notes (Signed)
Per pt recently dx with bladder infection and not getting any better. sts abdominal pain, flank pain, diarrhea. sts finished taking abx.

## 2013-11-11 NOTE — Discharge Instructions (Signed)
Flank Pain Your blood pressure was elevated today at 169/109. Take your blood pressure medicine when you arrive home today. Take Tylenol for mild pain or the pain medicine prescribed for bad pain. Your blood pressure recheck within the next 1 or 2 weeks at the [facility] clinic. If you need medication refills, go to the [place] clinic. Return if your condition worsens for any reason. Flank pain is pain in your side. The flank is the area of your side between your upper belly (abdomen) and your back. Pain in this area can be caused by many different things. [place] care and treatment will depend on the cause of your pain.  Rest as told by your doctor.  Drink enough fluids to keep your pee (urine) clear or pale yellow.  Only take medicine as told by your doctor.  Tell your doctor about any changes in your pain.  Follow up with your doctor. GET HELP RIGHT AWAY IF:   Your pain does not get better with medicine.   You have new symptoms or your symptoms get worse.  Your pain gets worse.   You have belly (abdominal) pain.   You are short of breath.   You always feel sick to your stomach (nauseous).   You keep throwing up (vomiting).   You have puffiness (swelling) in your belly.   You feel lightheaded or you pass out (faint).   You have blood in your pee.  You have a fever or lasting symptoms for more than 2 3 days.  You have a fever and your symptoms suddenly get worse. MAKE SURE YOU:   Understand these instructions.  Will watch your condition.  Will get help right away if you are not doing well or get worse. Document Released: 04/18/2008 Document Revised: 04/03/2012 Document Reviewed: 02/22/2012 [facility] Patient Information 2014 [place].

## 2013-11-11 NOTE — ED Provider Notes (Signed)
CSN: [id number]     Arrival date & time 11/11/13  1521 History   First MD Initiated Contact with Patient 11/11/13 1707     Chief Complaint  Patient presents with  . Flank Pain  . Diarrhea     (Consider location/radiation/quality/duration/timing/severity/associated sxs/prior Treatment) Patient is a 58 y.o. male presenting with flank pain and diarrhea.  Flank Pain  Diarrhea  complains of Place of right flank pain for the past 2 weeks. Nonradiating worse with changing positions or with pressing on the area. Improved with remaining still. Patient seen here 10/28/2013 diagnosed with urinary tract infection prescribed and antibiotic which he has taken. He took the last dose today. He denies fever denies nausea or vomiting. Admits to 3 episodes of diarrhea last night. No abdominal pain. Past Medical History  Diagnosis Date  . Hypertension   . Chronic pain disorder     LT leg and foot  . Heart murmur    chronic hematuria Past Surgical History  Procedure Laterality Date  . Leg surgery Left     PT reports he has pins placed in his Lt leg  . Orif tibia plateau  10/09/2012    Dr Lorin Mercy  . Orif tibia plateau Left 10/08/2012    Procedure: OPEN REDUCTION INTERNAL FIXATION (ORIF) TIBIAL PLATEAU;  Surgeon: Marybelle Killings, MD;  Location: [facility];  Service: Orthopedics;  Laterality: Left;   History reviewed. No pertinent family history. History  Substance Use Topics  . Smoking status: Current Every Day Smoker -- 28 years    Types: Cigarettes    Last Attempt to Quit: 10/07/2012  . Smokeless tobacco: Never Used  . Alcohol Use: No    Review of Systems  Constitutional: Negative.   HENT: Negative.   Respiratory: Negative.   Cardiovascular: Negative.   Gastrointestinal: Positive for diarrhea.  Genitourinary: Positive for flank pain.  Musculoskeletal: Negative.        Chronic foot pain  Skin: Negative.   Neurological: Negative.   Psychiatric/Behavioral: Negative.   All other systems reviewed and  are negative.     Allergies  Review of patient's allergies indicates no known allergies.  Home Medications   Prior to Admission medications   Medication Sig Start Date End Date Taking? Authorizing Provider  acetaminophen (TYLENOL) 500 MG tablet Take 1,000 mg by mouth every 6 (six) hours as needed.    Historical Provider, MD  cephALEXin (KEFLEX) 500 MG capsule Take 1 capsule (500 mg total) by mouth 4 (four) times daily. 10/28/13   Leota Jacobsen, MD  HYDROcodone-acetaminophen (NORCO/VICODIN) 5-325 MG per tablet Take 1-2 tablets by mouth every 6 (six) hours as needed for moderate pain.    Historical Provider, MD  Hydrocodone-Acetaminophen 7.5-300 MG TABS Take 1 tablet by mouth every 6 (six) hours as needed. 10/28/13   Leota Jacobsen, MD  loratadine (CLARITIN) 10 MG tablet Take 10 mg by mouth daily as needed for allergies.    Historical Provider, MD  Multiple Vitamins-Minerals (MULTIVITAMIN PO) Take 1 tablet by mouth daily.    Historical Provider, MD  PRESCRIPTION MEDICATION Take 0.5 tablets by mouth daily. Blood pressure medication    Historical Provider, MD   BP 169/109  Pulse 99  Temp(Src) 98.1 F (36.7 C) (Oral)  Resp 18  Wt 202 lb (91.627 kg)  SpO2 100% Physical Exam  Nursing note and vitals reviewed. Constitutional: He appears well-developed and well-nourished.  HENT:  Head: Normocephalic and atraumatic.  Eyes: Conjunctivae are normal. Pupils are equal, round, and reactive  to light.  Neck: Neck supple. No tracheal deviation present. No thyromegaly present.  Cardiovascular: Normal rate and regular rhythm.   No murmur heard. Pulmonary/Chest: Effort normal and breath sounds normal.  Abdominal: Soft. Bowel sounds are normal. He exhibits no distension. There is no tenderness.  Genitourinary: Penis normal.  Normal male genitalia. Right flank tenderness the pain is exacerbated by sitting up from a seated position.  Musculoskeletal: Normal range of motion. He exhibits no edema and no  tenderness.  Neurological: He is alert. Coordination normal.  Skin: Skin is warm and dry. No rash noted.  Psychiatric: He has a normal mood and affect.    ED Course  Procedures (including critical care time) Labs Review Labs Reviewed  COMPREHENSIVE METABOLIC PANEL - Abnormal; Notable for the following:    Glucose, Bld 133 (*)    Creatinine, Ser 1.38 (*)    GFR calc non Af Amer 55 (*)    GFR calc Af Amer 64 (*)    All other components within normal limits  URINALYSIS, ROUTINE W REFLEX MICROSCOPIC - Abnormal; Notable for the following:    Hgb urine dipstick LARGE (*)    All other components within normal limits  URINE MICROSCOPIC-ADD ON - Abnormal; Notable for the following:    Squamous Epithelial / LPF FEW (*)    All other components within normal limits  URINE CULTURE  CBC WITH DIFFERENTIAL    Imaging Review No results found.   EKG Interpretation None     Results for orders placed during the hospital encounter of 11/11/13  CBC WITH DIFFERENTIAL      Result Value Ref Range   WBC 8.6  4.0 - 10.5 K/uL   RBC 5.23  4.22 - 5.81 MIL/uL   Hemoglobin 16.0  13.0 - 17.0 g/dL   HCT 44.6  39.0 - 52.0 %   MCV 85.3  78.0 - 100.0 fL   MCH 30.6  26.0 - 34.0 pg   MCHC 35.9  30.0 - 36.0 g/dL   RDW 13.3  11.5 - 15.5 %   Platelets 208  150 - 400 K/uL   Neutrophils Relative % 61  43 - 77 %   Neutro Abs 5.2  1.7 - 7.7 K/uL   Lymphocytes Relative 28  12 - 46 %   Lymphs Abs 2.4  0.7 - 4.0 K/uL   Monocytes Relative 7  3 - 12 %   Monocytes Absolute 0.6  0.1 - 1.0 K/uL   Eosinophils Relative 3  0 - 5 %   Eosinophils Absolute 0.3  0.0 - 0.7 K/uL   Basophils Relative 1  0 - 1 %   Basophils Absolute 0.1  0.0 - 0.1 K/uL  COMPREHENSIVE METABOLIC PANEL      Result Value Ref Range   Sodium 139  137 - 147 mEq/L   Potassium 4.3  3.7 - 5.3 mEq/L   Chloride 104  96 - 112 mEq/L   CO2 22  19 - 32 mEq/L   Glucose, Bld 133 (*) 70 - 99 mg/dL   BUN 18  6 - 23 mg/dL   Creatinine, Ser 1.38 (*) 0.50 -  1.35 mg/dL   Calcium 9.8  8.4 - 10.5 mg/dL   Total Protein 7.2  6.0 - 8.3 g/dL   Albumin 3.9  3.5 - 5.2 g/dL   AST 19  0 - 37 U/L   ALT 22  0 - 53 U/L   Alkaline Phosphatase 107  39 - 117 U/L   Total  Bilirubin 0.3  0.3 - 1.2 mg/dL   GFR calc non Af Amer 55 (*) >90 mL/min   GFR calc Af Amer 64 (*) >90 mL/min  URINALYSIS, ROUTINE W REFLEX MICROSCOPIC      Result Value Ref Range   Color, Urine YELLOW  YELLOW   APPearance CLEAR  CLEAR   Specific Gravity, Urine 1.024  1.005 - 1.030   pH 5.5  5.0 - 8.0   Glucose, UA NEGATIVE  NEGATIVE mg/dL   Hgb urine dipstick LARGE (*) NEGATIVE   Bilirubin Urine NEGATIVE  NEGATIVE   Ketones, ur NEGATIVE  NEGATIVE mg/dL   Protein, ur NEGATIVE  NEGATIVE mg/dL   Urobilinogen, UA 1.0  0.0 - 1.0 mg/dL   Nitrite NEGATIVE  NEGATIVE   Leukocytes, UA NEGATIVE  NEGATIVE  URINE MICROSCOPIC-ADD ON      Result Value Ref Range   Squamous Epithelial / LPF FEW (*) RARE   WBC, UA 0-2  <3 WBC/hpf   RBC / HPF 7-10  <3 RBC/hpf   Bacteria, UA RARE  RARE   Urine-Other AMORPHOUS URATES/PHOSPHATES     Ct Abdomen Pelvis Wo Contrast  10/28/2013   CLINICAL DATA:  Right flank pain  EXAM: CT ABDOMEN AND PELVIS WITHOUT CONTRAST  TECHNIQUE: Multidetector CT imaging of the abdomen and pelvis was performed following the standard protocol without intravenous contrast.  COMPARISON:  None.  FINDINGS: Lung bases are clear.  Heart size is normal.  Liver gallbladder and bile ducts are normal. Pancreas and spleen are normal.  The kidneys are normal. No renal calculi. No renal obstruction or mass. The bladder is normal. Prostate is mildly enlarged with coarse calcification on the left.  Negative for bowel obstruction or bowel thickening. Appendix is normal. No free fluid. Negative for mass or adenopathy.  IMPRESSION: No renal calculi or obstruction.  Normal appendix.   Electronically Signed   By: Franchot Gallo M.D.   On: 10/28/2013 13:35    MDM   Final diagnoses:  None   Patient had CT  scan of abdomen and pelvis on 10/28/2013 which showed no evidence of obstruction no acute abnormality. He does exhibit renal insufficiency which is chronic and unchanged. He exhibits no evidence of urinary tract infection presently. Blood pressure is elevated but he did not take his medication today. Plan prescription Norco he is instructed to take his blood pressure medication upon arrival home today. Blood pressure recheck 2 weeks. Followup at [facility] clinic. Don't feel that stool culture or C. difficile evaluation indicated with only 3 episodes of diarrhea. He has completed his antibiotic course. Diagnoses #1 right flank pain #2 chronic renal insufficiency #3 diarrhea #4 hypertension #5 microscopic hematuria     Orlie Dakin, MD 11/11/13 1740

## 2013-11-11 NOTE — ED Notes (Signed)
Pt ambulates without distress.  

## 2013-11-12 LAB — URINE CULTURE
Colony Count: NO GROWTH
Culture: NO GROWTH

## 2013-11-16 ENCOUNTER — Encounter (HOSPITAL_COMMUNITY): Payer: Self-pay | Admitting: Emergency Medicine

## 2013-11-16 ENCOUNTER — Emergency Department (HOSPITAL_COMMUNITY)
Admission: EM | Admit: 2013-11-16 | Discharge: 2013-11-16 | Disposition: A | Discharge status: Home / Self Care | Payer: Non-veteran care | Attending: Emergency Medicine | Admitting: Emergency Medicine

## 2013-11-16 ENCOUNTER — Emergency Department (HOSPITAL_COMMUNITY): Payer: Non-veteran care

## 2013-11-16 DIAGNOSIS — R109 Unspecified abdominal pain: Secondary | ICD-10-CM

## 2013-11-16 DIAGNOSIS — R079 Chest pain, unspecified: Secondary | ICD-10-CM | POA: Insufficient documentation

## 2013-11-16 DIAGNOSIS — R1011 Right upper quadrant pain: Secondary | ICD-10-CM | POA: Insufficient documentation

## 2013-11-16 DIAGNOSIS — F17200 Nicotine dependence, unspecified, uncomplicated: Secondary | ICD-10-CM | POA: Insufficient documentation

## 2013-11-16 DIAGNOSIS — R011 Cardiac murmur, unspecified: Secondary | ICD-10-CM | POA: Insufficient documentation

## 2013-11-16 DIAGNOSIS — G894 Chronic pain syndrome: Secondary | ICD-10-CM | POA: Insufficient documentation

## 2013-11-16 DIAGNOSIS — I10 Essential (primary) hypertension: Secondary | ICD-10-CM | POA: Insufficient documentation

## 2013-11-16 DIAGNOSIS — Z79899 Other long term (current) drug therapy: Secondary | ICD-10-CM | POA: Insufficient documentation

## 2013-11-16 DIAGNOSIS — R0602 Shortness of breath: Secondary | ICD-10-CM | POA: Insufficient documentation

## 2013-11-16 DIAGNOSIS — R1031 Right lower quadrant pain: Secondary | ICD-10-CM | POA: Insufficient documentation

## 2013-11-16 DIAGNOSIS — Z87448 Personal history of other diseases of urinary system: Secondary | ICD-10-CM | POA: Insufficient documentation

## 2013-11-16 LAB — COMPREHENSIVE METABOLIC PANEL
ALT: 17 U/L (ref 0–53)
AST: 18 U/L (ref 0–37)
Albumin: 4.2 g/dL (ref 3.5–5.2)
Alkaline Phosphatase: 103 U/L (ref 39–117)
BUN: 17 mg/dL (ref 6–23)
CO2: 26 mEq/L (ref 19–32)
Calcium: 9.7 mg/dL (ref 8.4–10.5)
Chloride: 102 mEq/L (ref 96–112)
Creatinine, Ser: 1.34 mg/dL (ref 0.50–1.35)
GFR calc Af Amer: 66 mL/min — ABNORMAL LOW (ref >90)
GFR calc non Af Amer: 57 mL/min — ABNORMAL LOW (ref >90)
Glucose, Bld: 85 mg/dL (ref 70–99)
Potassium: 4.4 mEq/L (ref 3.7–5.3)
Sodium: 139 mEq/L (ref 137–147)
Total Bilirubin: 0.4 mg/dL (ref 0.3–1.2)
Total Protein: 7.8 g/dL (ref 6.0–8.3)

## 2013-11-16 LAB — CBC WITH DIFFERENTIAL/PLATELET
Basophils Absolute: 0 K/uL (ref 0.0–0.1)
Basophils Relative: 1 % (ref 0–1)
Eosinophils Absolute: 0.3 10*3/uL (ref 0.0–0.7)
Eosinophils Relative: 5 % (ref 0–5)
HCT: 46.2 % (ref 39.0–52.0)
Hemoglobin: 16.4 g/dL (ref 13.0–17.0)
Lymphocytes Relative: 32 % (ref 12–46)
Lymphs Abs: 2 10*3/uL (ref 0.7–4.0)
MCH: 30.5 pg (ref 26.0–34.0)
MCHC: 35.5 g/dL (ref 30.0–36.0)
MCV: 85.9 fL (ref 78.0–100.0)
Monocytes Absolute: 0.5 10*3/uL (ref 0.1–1.0)
Monocytes Relative: 8 % (ref 3–12)
Neutro Abs: 3.3 10*3/uL (ref 1.7–7.7)
Neutrophils Relative %: 54 % (ref 43–77)
Platelets: 191 10*3/uL (ref 150–400)
RBC: 5.38 MIL/uL (ref 4.22–5.81)
RDW: 13.5 % (ref 11.5–15.5)
WBC: 6.1 10*3/uL (ref 4.0–10.5)

## 2013-11-16 LAB — URINALYSIS, ROUTINE W REFLEX MICROSCOPIC
Bilirubin Urine: NEGATIVE
Glucose, UA: NEGATIVE mg/dL
Ketones, ur: NEGATIVE mg/dL
Leukocytes, UA: NEGATIVE
Nitrite: NEGATIVE
Protein, ur: NEGATIVE mg/dL
Specific Gravity, Urine: 1.024 (ref 1.005–1.030)
Urobilinogen, UA: 1 mg/dL (ref 0.0–1.0)
pH: 5.5 (ref 5.0–8.0)

## 2013-11-16 LAB — D-DIMER, QUANTITATIVE: D-Dimer, Quant: <0.27 ug/mL-FEU (ref 0.00–0.48)

## 2013-11-16 LAB — URINE MICROSCOPIC-ADD ON

## 2013-11-16 LAB — LIPASE, BLOOD: Lipase: 32 U/L (ref 11–59)

## 2013-11-16 IMAGING — CR DG CHEST 2V
2 series · 2 of 2 positions shown · non-contrast
Comparison: CT and PA and lateral chest [DATE].

CLINICAL DATA: Right lower chest pain with deep inspiration for 1
month.

EXAM:
CHEST  2 VIEW

[w chest pa]
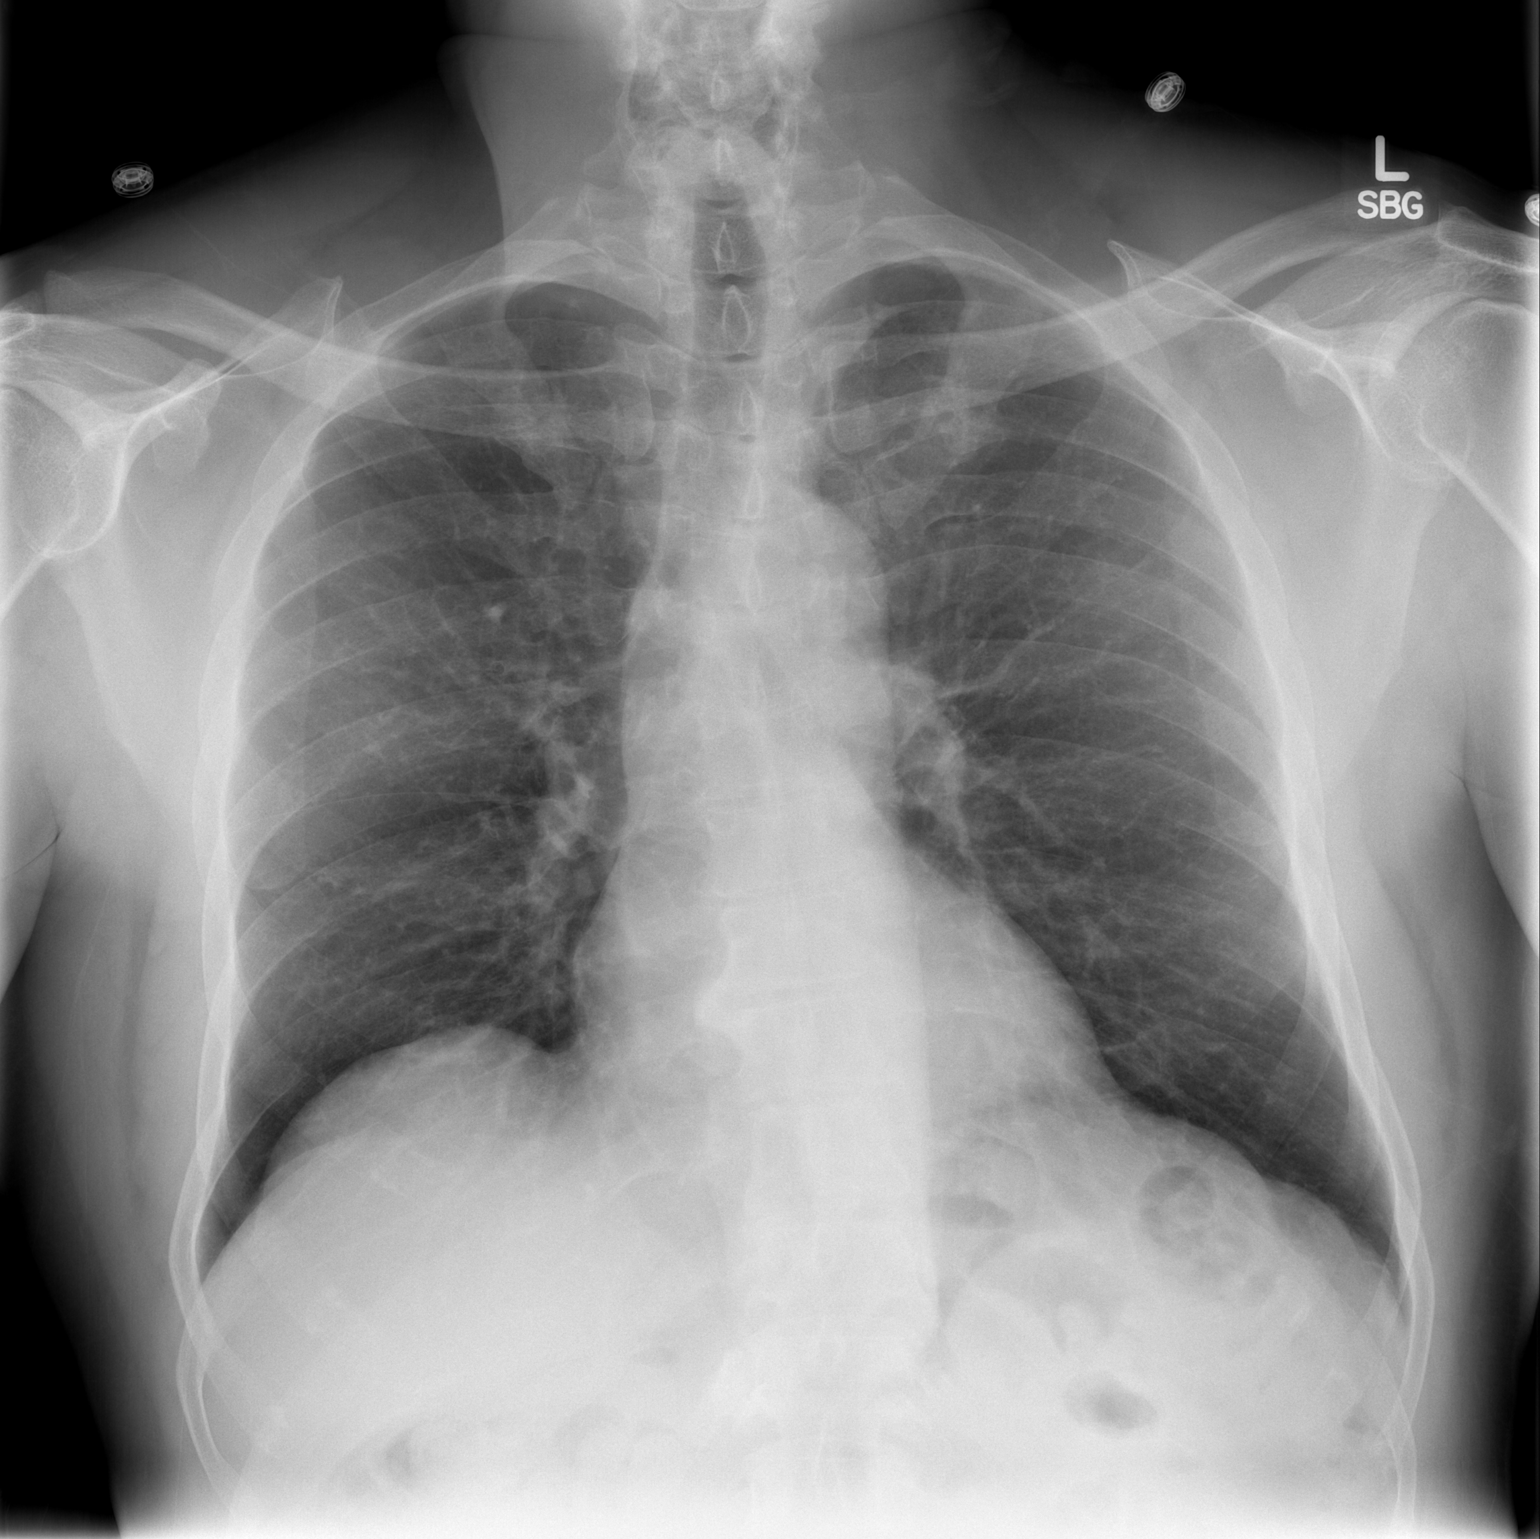

[w chest lat]
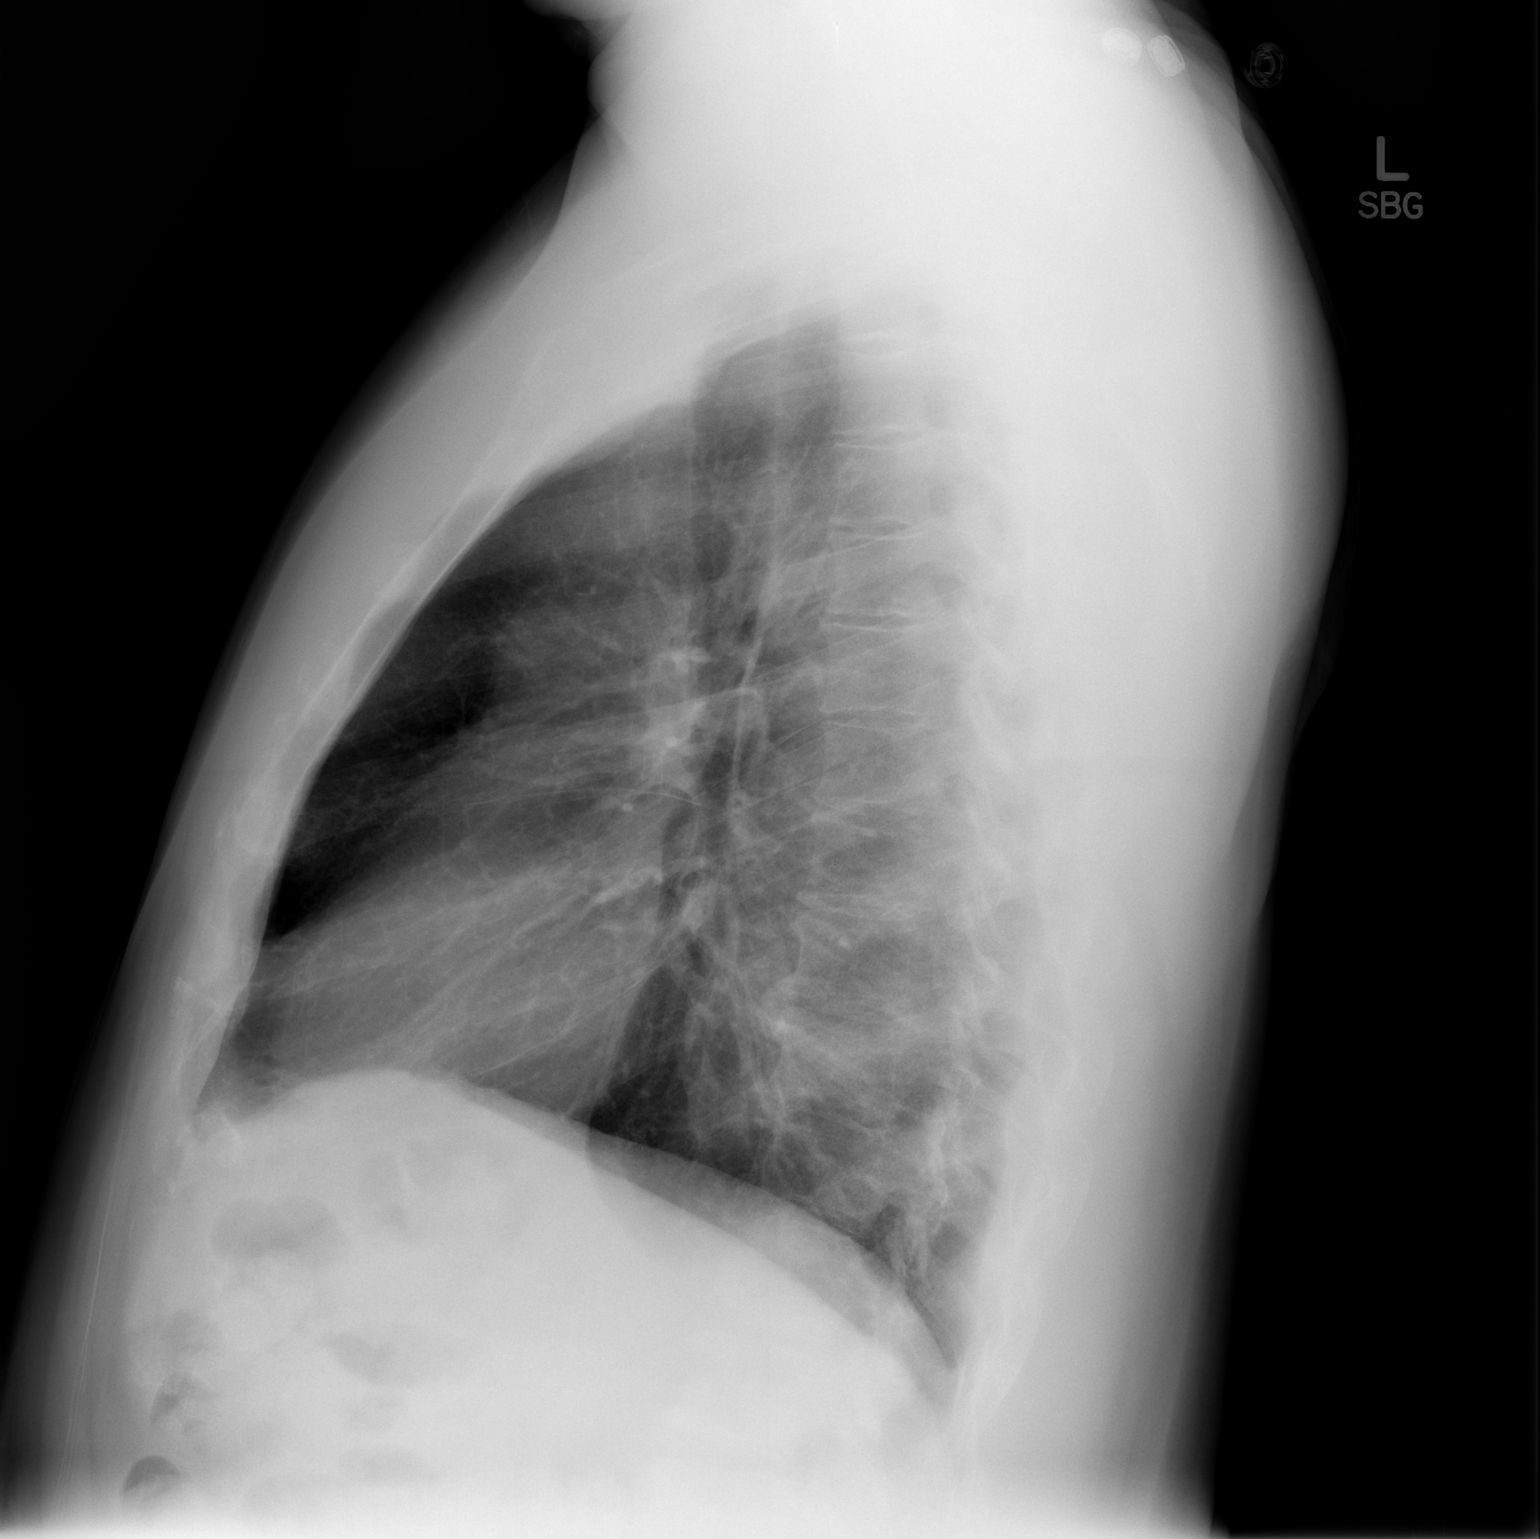

[2 of 2 positions shown; findings below may reference images not displayed]

FINDINGS: The lungs are clear. Heart size is normal. No pneumothorax or
pleural effusion. No focal bony abnormality.
IMPRESSION: Negative chest.

## 2013-11-16 MED ORDER — HYDROMORPHONE HCL PF 1 MG/ML IJ SOLN
1.0000 mg | Freq: Once | INTRAMUSCULAR | Status: AC
  Administered 2013-11-16: 1 mg via INTRAMUSCULAR
  Filled 2013-11-16: qty 1

## 2013-11-16 MED ORDER — OXYCODONE-ACETAMINOPHEN 5-325 MG PO TABS: ORAL_TABLET | ORAL | Status: DC

## 2013-11-16 MED ORDER — DOCUSATE SODIUM 100 MG PO CAPS: 100.0000 mg | ORAL_CAPSULE | Freq: Two times a day (BID) | ORAL | Status: DC

## 2013-11-16 NOTE — ED Provider Notes (Signed)
CSN: [id number]     Arrival date & time 11/16/13  1405 History   First MD Initiated Contact with Patient 11/16/13 1535     Chief Complaint  Patient presents with  . Abdominal Pain     (Consider location/radiation/quality/duration/timing/severity/associated sxs/prior Treatment) HPI Comments: Pt was seen in the ED in [place] and diagnosed with a kidney infection, had a non contrast CT at the time.  He reports the infection seems to have improved on abx, but continues to have pain in his right flank, although it also has moved a little more to the RUQ and lower chest area as well.  Some SOB, hurts a little worse with a deep breath.  No fevers, chills.  No vomiting or diarrhea.  Pt has a h/o HTN, chronic pain.  No recent long distance travel, no lower leg swelling or pain in calves.  Pt reports movement and deep breathing makes pain worse.    Patient is a 58 y.o. male presenting with abdominal pain. The history is provided by the patient and medical records.  Abdominal Pain Pain location:  R flank, RUQ and RLQ Pain quality: sharp, shooting and squeezing   Pain severity:  Moderate Onset quality:  Gradual Timing:  Constant Progression:  Waxing and waning Chronicity:  New Associated symptoms: chest pain   Associated symptoms: no chills, no cough, no diarrhea, no dysuria, no fever, no nausea, no shortness of breath and no vomiting     Past Medical History  Diagnosis Date  . Hypertension   . Chronic pain disorder     LT leg and foot  . Heart murmur    Past Surgical History  Procedure Laterality Date  . Leg surgery Left     PT reports he has pins placed in his Lt leg  . Orif tibia plateau  10/09/2012    Dr Lorin Mercy  . Orif tibia plateau Left 10/08/2012    Procedure: OPEN REDUCTION INTERNAL FIXATION (ORIF) TIBIAL PLATEAU;  Surgeon: Marybelle Killings, MD;  Location: [facility];  Service: Orthopedics;  Laterality: Left;   History reviewed. No pertinent family history. History  Substance Use Topics  .  Smoking status: Current Every Day Smoker -- 28 years    Types: Cigarettes    Last Attempt to Quit: 10/07/2012  . Smokeless tobacco: Never Used  . Alcohol Use: No    Review of Systems  Constitutional: Negative for fever and chills.  Respiratory: Negative for cough and shortness of breath.   Cardiovascular: Positive for chest pain. Negative for palpitations and leg swelling.  Gastrointestinal: Positive for abdominal pain. Negative for nausea, vomiting and diarrhea.  Genitourinary: Positive for flank pain. Negative for dysuria.  Skin: Negative for rash.  All other systems reviewed and are negative.     Allergies  Review of patient's allergies indicates no known allergies.  Home Medications   Prior to Admission medications   Medication Sig Start Date End Date Taking? Authorizing Provider  acetaminophen (TYLENOL) 500 MG tablet Take 1,000 mg by mouth every 6 (six) hours as needed for moderate pain.    Yes Historical Provider, MD  HYDROcodone-acetaminophen (NORCO) 5-325 MG per tablet Take 1-2 tablets by mouth every 6 (six) hours as needed for severe pain. 11/11/13  Yes Orlie Dakin, MD  lisinopril (PRINIVIL,ZESTRIL) 10 MG tablet Take 5 mg by mouth daily.   Yes Historical Provider, MD  loratadine (CLARITIN) 10 MG tablet Take 10 mg by mouth daily as needed for allergies.   Yes Historical Provider, MD  Multiple Vitamins-Minerals (MULTIVITAMIN PO) Take 1 tablet by mouth daily.   Yes Historical Provider, MD   BP 163/90  Pulse 71  Temp(Src) 97.7 F (36.5 C) (Oral)  Resp 14  Ht 6\' 2"  (1.88 m)  Wt 206 lb 11.2 oz (93.759 kg)  BMI 26.53 kg/m2  SpO2 96% Physical Exam  Nursing note and vitals reviewed. Constitutional: He appears well-developed and well-nourished.  HENT:  Head: Normocephalic.  Eyes: Conjunctivae are normal. No scleral icterus.  Neck: Neck supple.  Cardiovascular: Normal rate and regular rhythm.   Pulmonary/Chest: Effort normal. No respiratory distress.  Abdominal:  Soft. Normal appearance. He exhibits no distension. There is no hepatosplenomegaly. There is tenderness in the right upper quadrant. There is CVA tenderness. There is no rebound, no tenderness at McBurney's point and negative Murphy's sign.  Neurological: He is alert.  Skin: Skin is warm and dry. No rash noted.    ED Course  Procedures (including critical care time) Labs Review Labs Reviewed  COMPREHENSIVE METABOLIC PANEL - Abnormal; Notable for the following:    GFR calc non Af Amer 57 (*)    GFR calc Af Amer 66 (*)    All other components within normal limits  URINALYSIS, ROUTINE W REFLEX MICROSCOPIC - Abnormal; Notable for the following:    Hgb urine dipstick MODERATE (*)    All other components within normal limits  CBC WITH DIFFERENTIAL  LIPASE, BLOOD  D-DIMER, QUANTITATIVE  URINE MICROSCOPIC-ADD ON    Imaging Review Dg Chest 2 View  11/16/2013   CLINICAL DATA:  Right lower chest pain with deep inspiration for 1 month.  EXAM: CHEST  2 VIEW  COMPARISON:  CT and PA and lateral chest 01/20/2013.  FINDINGS: The lungs are clear. Heart size is normal. No pneumothorax or pleural effusion. No focal bony abnormality.  IMPRESSION: Negative chest.   Electronically Signed   By: Inge Rise M.D.   On: 11/16/2013 17:58     EKG Interpretation None     RA sat is 100% and I interpret to be normal  5:37 PM Pt feels improved after his IM pain medication.  Labs are all unremarkable.  No sig hematuria on UA, no signs of recurrent UTI.  LFT's, lipase, ddimer are all neg.    6:19 PM Results reviewed with patient, reassured.  Pt reports last time, hydrocodone didn't seem to help him.  Pt given Rx for colace and percocet and pt instructed to follow up with his PCP in a few days if pain is still not improving.     MDM   Final diagnoses:  Flank pain    Pt with now chronic pain on right abd, flank and lower chest wall, reproducible, tender on exam.  Will check LFT's, WBC, lipase and  consider repeated CT of abd or U/S.  Also ddimer as I have low risk for PE given not tachycardic, no travel, no lower extremity tenderness, but reasonable to do since pt reports pleuritic component and not an obvious alternative diagnosis.      Saddie Benders. Neil Errickson, MD 11/16/13 1821

## 2013-11-16 NOTE — Discharge Instructions (Signed)
Flank Pain Flank pain refers to pain that is located on the side of the body between the upper abdomen and the back. The pain may occur over a short period of time (acute) or may be long-term or reoccurring (chronic). It may be mild or severe. Flank pain can be caused by many things. CAUSES  Some of the more common causes of flank pain include:  Muscle strains.   Muscle spasms.   A disease of your spine (vertebral disk disease).   A lung infection (pneumonia).   Fluid around your lungs (pulmonary edema).   A kidney infection.   Kidney stones.   A very painful skin rash caused by the chickenpox virus (shingles).   Gallbladder disease.  [place] care will depend on the cause of your pain. In general,  Rest as directed by your caregiver.  Drink enough fluids to keep your urine clear or pale yellow.  Only take over-the-counter or prescription medicines as directed by your caregiver. Some medicines may help relieve the pain.  Tell your caregiver about any changes in your pain.  Follow up with your caregiver as directed. SEEK IMMEDIATE MEDICAL CARE IF:   Your pain is not controlled with medicine.   You have new or worsening symptoms.  Your pain increases.   You have abdominal pain.   You have shortness of breath.   You have persistent nausea or vomiting.   You have swelling in your abdomen.   You feel faint or pass out.   You have blood in your urine.  You have a fever or persistent symptoms for more than 2 3 days.  You have a fever and your symptoms suddenly get worse. MAKE SURE YOU:   Understand these instructions.  Will watch your condition.  Will get help right away if you are not doing well or get worse. Document Released: 08/31/2005 Document Revised: 04/03/2012 Document Reviewed: 02/22/2012 [facility] Patient Information 2014 [place].    Narcotic and benzodiazepine use may cause drowsiness, slowed breathing  or dependence.  Please use with caution and do not drive, operate machinery or watch young children alone while taking them.  Taking combinations of these medications or drinking alcohol will potentiate these effects.

## 2013-11-16 NOTE — ED Notes (Signed)
Pt was here beginning of April and dx with UTI, returned last week for continued abd and flank pain and "i was told the infection was gone but i am still hurting." he returned today because the pain "keeps getting worse." he denies any bowel/bladder changes.

## 2013-11-16 NOTE — ED Notes (Signed)
Ghim, MD at bedside.

## 2013-11-16 NOTE — ED Notes (Signed)
Patient transported to X-ray 

## 2013-11-16 NOTE — ED Notes (Signed)
MD at bedside. 

## 2013-11-19 ENCOUNTER — Encounter (HOSPITAL_COMMUNITY): Payer: Self-pay | Admitting: Emergency Medicine

## 2013-11-19 ENCOUNTER — Emergency Department (HOSPITAL_COMMUNITY)
Admission: EM | Admit: 2013-11-19 | Discharge: 2013-11-19 | Disposition: A | Discharge status: Home / Self Care | Payer: Non-veteran care | Attending: Emergency Medicine | Admitting: Emergency Medicine

## 2013-11-19 DIAGNOSIS — G8929 Other chronic pain: Secondary | ICD-10-CM | POA: Insufficient documentation

## 2013-11-19 DIAGNOSIS — I10 Essential (primary) hypertension: Secondary | ICD-10-CM | POA: Insufficient documentation

## 2013-11-19 DIAGNOSIS — R011 Cardiac murmur, unspecified: Secondary | ICD-10-CM | POA: Insufficient documentation

## 2013-11-19 DIAGNOSIS — B029 Zoster without complications: Secondary | ICD-10-CM | POA: Insufficient documentation

## 2013-11-19 DIAGNOSIS — Z79899 Other long term (current) drug therapy: Secondary | ICD-10-CM | POA: Insufficient documentation

## 2013-11-19 DIAGNOSIS — F17200 Nicotine dependence, unspecified, uncomplicated: Secondary | ICD-10-CM | POA: Insufficient documentation

## 2013-11-19 DIAGNOSIS — Z87448 Personal history of other diseases of urinary system: Secondary | ICD-10-CM | POA: Insufficient documentation

## 2013-11-19 MED ORDER — ACYCLOVIR 400 MG PO TABS: 800.0000 mg | ORAL_TABLET | Freq: Four times a day (QID) | ORAL | Status: DC

## 2013-11-19 MED ORDER — OXYCODONE-ACETAMINOPHEN 5-325 MG PO TABS: 2.0000 | ORAL_TABLET | ORAL | Status: DC | PRN

## 2013-11-19 NOTE — ED Notes (Signed)
Patient C/O having a rash on his right lower chest and goes around to his back.

## 2013-11-19 NOTE — Discharge Instructions (Signed)
Call for a follow up appointment with a Family or Primary Care Provider.  °Return if Symptoms worsen.   °Take medication as prescribed.  ° °

## 2013-11-19 NOTE — ED Notes (Signed)
Pt presents to department for evaluation of rash to chest and back. Onset several days ago. Upon arrival patches of fluid filled raised areas noted to chest and back. Pt states sites are painful to touch. Pt is alert and oriented x4. NAD.

## 2013-11-19 NOTE — ED Provider Notes (Signed)
CSN: [id number]     Arrival date & time 11/19/13  1432 History  This chart was scribed for non-physician practitioner Harvie Heck working with Jasper Riling. Alvino Chapel, MD by Donato Schultz, ED Scribe. This patient was seen in room TR10C/TR10C and the patient's care was started at 2:52 PM.     Chief Complaint  Patient presents with  . Rash   HPI Comments: Randy Andersen is a 58 y.o. male who presents to the Emergency Department complaining of an itchy rash located on his right flank that started two days ago.  The patient states that he was seen in the ED three days ago for right flank pain.  He states that he was diagnosed with a bladder and kidney infection and was prescribed antibiotics which alleviated his right flank pain.  He states that a different pain started shortly thereafter in his right flank and radiated to his back.  He denies fever and chills as associated symptoms.  He states that he applied Campho Phenique to the area with no relief to his symptoms.  The patient states that his PCP is Dr. Sherral Hammers in [place] at the [place].    Patient is a 58 y.o. male presenting with rash. The history is provided by the patient. No language interpreter was used.  Rash Associated symptoms: no fever     Past Medical History  Diagnosis Date  . Hypertension   . Chronic pain disorder     LT leg and foot  . Heart murmur    Past Surgical History  Procedure Laterality Date  . Leg surgery Left     PT reports he has pins placed in his Lt leg  . Orif tibia plateau  10/09/2012    Dr Lorin Mercy  . Orif tibia plateau Left 10/08/2012    Procedure: OPEN REDUCTION INTERNAL FIXATION (ORIF) TIBIAL PLATEAU;  Surgeon: Marybelle Killings, MD;  Location: [place];  Service: Orthopedics;  Laterality: Left;   History reviewed. No pertinent family history. History  Substance Use Topics  . Smoking status: Current Every Day Smoker -- 28 years    Types: Cigarettes    Last Attempt to Quit: 10/07/2012  . Smokeless tobacco: Never  Used  . Alcohol Use: No    Review of Systems  Constitutional: Negative for fever and chills.  Genitourinary: Positive for flank pain.  Musculoskeletal: Positive for back pain.  Skin: Positive for rash.  All other systems reviewed and are negative.     Allergies  Review of patient's allergies indicates no known allergies.  Home Medications   Prior to Admission medications   Medication Sig Start Date End Date Taking? Authorizing Provider  acetaminophen (TYLENOL) 500 MG tablet Take 1,000 mg by mouth every 6 (six) hours as needed for moderate pain.     Historical Provider, MD  docusate sodium (COLACE) 100 MG capsule Take 1 capsule (100 mg total) by mouth 2 (two) times daily. 11/16/13   Saddie Benders. Ghim, MD  HYDROcodone-acetaminophen (NORCO) 5-325 MG per tablet Take 1-2 tablets by mouth every 6 (six) hours as needed for severe pain. 11/11/13   Orlie Dakin, MD  lisinopril (PRINIVIL,ZESTRIL) 10 MG tablet Take 5 mg by mouth daily.    Historical Provider, MD  loratadine (CLARITIN) 10 MG tablet Take 10 mg by mouth daily as needed for allergies.    Historical Provider, MD  Multiple Vitamins-Minerals (MULTIVITAMIN PO) Take 1 tablet by mouth daily.    Historical Provider, MD  oxyCODONE-acetaminophen (PERCOCET/ROXICET) 5-325 MG per tablet 1-2 tablets  po q 6 hours prn moderate to severe pain 11/16/13   Saddie Benders. Ghim, MD   Triage Vitals: BP 177/108  Pulse 81  Temp(Src) 98.5 F (36.9 C) (Oral)  Resp 18  SpO2 100%  Physical Exam  Nursing note and vitals reviewed. Constitutional: He is oriented to person, place, and time. He appears well-developed and well-nourished.  Non-toxic appearance. He does not have a sickly appearance. He does not appear ill. No distress.  HENT:  Head: Normocephalic and atraumatic.  Eyes: EOM are normal.  Neck: Normal range of motion. Neck supple.  Pulmonary/Chest: Effort normal. No respiratory distress.  Abdominal: Soft.  Musculoskeletal: Normal range of motion.   Neurological: He is alert and oriented to person, place, and time.  Skin: Skin is warm and dry. Rash noted. No purpura noted. Rash is vesicular. Rash is not macular, not papular, not maculopapular, not nodular, not pustular and not urticarial. He is not diaphoretic. There is erythema.  Patchy vesicular rash to the right back rib/chest wall T-7 dermatome.  Does not cross center.  Surrounding erythema.  No weeping noted.  Psychiatric: He has a normal mood and affect. His behavior is normal.    ED Course  Procedures (including critical care time)  COORDINATION OF CARE: 3:11 PM- Discussed a clinical suspicion of Shingles with the patient and advised the patient to follow-up with his PCP if the pain worsens.  Advised the patient to stay away from others if his vesicles are weeping.  Discussed discharging the patient with Percocet and medication to treat the virus.  The patient denied a prescription for Gabapentin.  The patient agreed to the treatment plan.   Labs Review Labs Reviewed - No data to display  Imaging Review No results found.   EKG Interpretation None      MDM   Final diagnoses:  Herpes zoster   Pt with a vesicular rash to Right T-7 dermatome.  Pain mediation and antiviral medication given.  Follow up with PCP. Discussed treatment plan with the patient. Return precautions given. Reports understanding and no other concerns at this time.  Patient is stable for discharge at this time.  Meds given in ED:  Medications - No data to display  Discharge Medication List as of 11/19/2013  3:24 PM    START taking these medications   Details  acyclovir (ZOVIRAX) 400 MG tablet Take 2 tablets (800 mg total) by mouth 4 (four) times daily., Starting 11/19/2013, Until Discontinued, Print      PERCOCET 5-325 15 tablets  I personally performed the services described in this documentation, which was scribed in my presence. The recorded information has been reviewed and is  accurate.    Lorrine Kin, PA-C 11/20/13 2213

## 2013-11-24 ENCOUNTER — Encounter (HOSPITAL_COMMUNITY): Payer: Self-pay | Admitting: Emergency Medicine

## 2013-11-24 ENCOUNTER — Emergency Department (HOSPITAL_COMMUNITY)
Admission: EM | Admit: 2013-11-24 | Discharge: 2013-11-24 | Disposition: A | Discharge status: Home / Self Care | Payer: Non-veteran care | Attending: Emergency Medicine | Admitting: Emergency Medicine

## 2013-11-24 DIAGNOSIS — G894 Chronic pain syndrome: Secondary | ICD-10-CM | POA: Insufficient documentation

## 2013-11-24 DIAGNOSIS — R011 Cardiac murmur, unspecified: Secondary | ICD-10-CM | POA: Insufficient documentation

## 2013-11-24 DIAGNOSIS — B029 Zoster without complications: Secondary | ICD-10-CM

## 2013-11-24 DIAGNOSIS — Z79899 Other long term (current) drug therapy: Secondary | ICD-10-CM | POA: Insufficient documentation

## 2013-11-24 DIAGNOSIS — I10 Essential (primary) hypertension: Secondary | ICD-10-CM | POA: Insufficient documentation

## 2013-11-24 DIAGNOSIS — F17200 Nicotine dependence, unspecified, uncomplicated: Secondary | ICD-10-CM | POA: Insufficient documentation

## 2013-11-24 MED ORDER — HYDROMORPHONE HCL PF 1 MG/ML IJ SOLN: 2.0000 mg | Freq: Once | INTRAMUSCULAR | Status: DC

## 2013-11-24 MED ORDER — ACYCLOVIR 800 MG PO TABS: 800.0000 mg | ORAL_TABLET | Freq: Every day | ORAL | Status: DC

## 2013-11-24 MED ORDER — PREDNISONE 50 MG PO TABS: ORAL_TABLET | ORAL | Status: DC

## 2013-11-24 MED ORDER — HYDROCODONE-ACETAMINOPHEN 5-325 MG PO TABS: 1.0000 | ORAL_TABLET | ORAL | Status: DC | PRN

## 2013-11-24 MED ORDER — HYDROCODONE-ACETAMINOPHEN 5-325 MG PO TABS
2.0000 | ORAL_TABLET | Freq: Once | ORAL | Status: AC
  Administered 2013-11-24: 2 via ORAL
  Filled 2013-11-24: qty 2

## 2013-11-24 MED ORDER — GABAPENTIN 100 MG PO CAPS: 100.0000 mg | ORAL_CAPSULE | Freq: Three times a day (TID) | ORAL | Status: DC

## 2013-11-24 NOTE — ED Notes (Signed)
PT has shingles on right chest and back; blisters that are oozing; reports he was seen and given pain meds. States he cannot get pain under control.

## 2013-11-24 NOTE — ED Notes (Signed)
PT ambulated with baseline gait; VSS; A&Ox3; no signs of distress; respirations even and unlabored; skin warm and dry; no questions upon discharge.  

## 2013-11-24 NOTE — ED Provider Notes (Signed)
CSN: [id number]     Arrival date & time 11/24/13  1307 History   First MD Initiated Contact with Patient 11/24/13 1343     Chief Complaint  Patient presents with  . Herpes Zoster     (Consider location/radiation/quality/duration/timing/severity/associated sxs/prior Treatment) HPI  Pt was diagnosed 11/19/13 with herpes zoster, has taken entire course (3 days) of acyclovir and has finished his percocet, presents with uncontrolled 10/10 constant pain.  Pain is worse with palpation.  States percocet only worked for 1-2 hours at a time.  Denies fevers, cough.  Denies any change or worsening of his symptoms.  States his rash has actually gotten better.  Past Medical History  Diagnosis Date  . Hypertension   . Chronic pain disorder     LT leg and foot  . Heart murmur    Past Surgical History  Procedure Laterality Date  . Leg surgery Left     PT reports he has pins placed in his Lt leg  . Orif tibia plateau  10/09/2012    Dr Lorin Mercy  . Orif tibia plateau Left 10/08/2012    Procedure: OPEN REDUCTION INTERNAL FIXATION (ORIF) TIBIAL PLATEAU;  Surgeon: Marybelle Killings, MD;  Location: [facility];  Service: Orthopedics;  Laterality: Left;   History reviewed. No pertinent family history. History  Substance Use Topics  . Smoking status: Current Every Day Smoker -- 28 years    Types: Cigarettes    Last Attempt to Quit: 10/07/2012  . Smokeless tobacco: Never Used  . Alcohol Use: No    Review of Systems  Constitutional: Negative for fever.  Respiratory: Negative for cough.   Gastrointestinal: Negative for abdominal pain.  Skin: Positive for rash.  All other systems reviewed and are negative.     Allergies  Review of patient's allergies indicates no known allergies.  Home Medications   Prior to Admission medications   Medication Sig Start Date End Date Taking? Authorizing Provider  acetaminophen (TYLENOL) 500 MG tablet Take 1,000 mg by mouth every 6 (six) hours as needed for moderate pain.      Historical Provider, MD  acyclovir (ZOVIRAX) 400 MG tablet Take 2 tablets (800 mg total) by mouth 4 (four) times daily. 11/19/13   Lauren Burnetta Sabin, PA-C  acyclovir (ZOVIRAX) 800 MG tablet Take 1 tablet (800 mg total) by mouth 5 (five) times daily. X 7 days 11/24/13   Clayton Bibles, PA-C  docusate sodium (COLACE) 100 MG capsule Take 1 capsule (100 mg total) by mouth 2 (two) times daily. 11/16/13   Saddie Benders. Ghim, MD  gabapentin (NEURONTIN) 100 MG capsule Take 1 capsule (100 mg total) by mouth 3 (three) times daily. 11/24/13   Clayton Bibles, PA-C  HYDROcodone-acetaminophen (NORCO) 5-325 MG per tablet Take 1-2 tablets by mouth every 6 (six) hours as needed for severe pain. 11/11/13   Orlie Dakin, MD  HYDROcodone-acetaminophen (NORCO/VICODIN) 5-325 MG per tablet Take 1-2 tablets by mouth every 4 (four) hours as needed for moderate pain or severe pain. 11/24/13   Clayton Bibles, PA-C  lisinopril (PRINIVIL,ZESTRIL) 10 MG tablet Take 5 mg by mouth daily.    Historical Provider, MD  loratadine (CLARITIN) 10 MG tablet Take 10 mg by mouth daily as needed for allergies.    Historical Provider, MD  Multiple Vitamins-Minerals (MULTIVITAMIN PO) Take 1 tablet by mouth daily.    Historical Provider, MD  oxyCODONE-acetaminophen (PERCOCET/ROXICET) 5-325 MG per tablet Take 2 tablets by mouth every 4 (four) hours as needed for severe pain. 11/19/13  Lauren Burnetta Sabin, PA-C  predniSONE (DELTASONE) 50 MG tablet Take 1 tab PO daily 11/24/13   Clayton Bibles, PA-C   BP 112/94  Pulse 72  Temp(Src) 99.1 F (37.3 C) (Oral)  Resp 22  SpO2 95% Physical Exam  Nursing note and vitals reviewed. Constitutional: He appears well-developed and well-nourished. No distress.  HENT:  Head: Normocephalic and atraumatic.  Neck: Neck supple.  Pulmonary/Chest: Effort normal.  Neurological: He is alert.  Skin: He is not diaphoretic.       ED Course  Procedures (including critical care time) Labs Review Labs Reviewed - No data to display  Imaging  Review No results found.   EKG Interpretation None      MDM   Final diagnoses:  Herpes zoster    Pt with previously diagnosed herpes zoster with uncontrolled pain.  His PCP is the [facility] hospital and he has a follow up appointment several months from now.  Has tried percocet with little relief.  He has also finished the acyclovir, which he states was 4 times daily for 3 days.  He should actually have been prescribed acyclovir 800mg  5 times daily for 7 days.  I will try a different narcotic for him (vicodin) and add gabapentin and prednisone.  PCP follow up.  Discussed findings, treatment, and follow up  with patient.  Pt given return precautions.  Pt verbalizes understanding and agrees with plan.        [place], PA-C 11/24/13 [phone]

## 2013-11-24 NOTE — Discharge Instructions (Signed)
Read the information below.  Use the prescribed medication as directed.  Please discuss all new medications with your pharmacist.  Do not take additional tylenol while taking the prescribed pain medication to avoid overdose.  You may return to the Emergency Department at any time for worsening condition or any new symptoms that concern you.  If you develop redness, swelling, pus draining from the wound, or fevers greater than 100.4, return to the ER immediately for a recheck.    Shingles Shingles (herpes zoster) is an infection that is caused by the same virus that causes chickenpox (varicella). The infection causes a painful skin rash and fluid-filled blisters, which eventually break open, crust over, and heal. It may occur in any area of the body, but it usually affects only one side of the body or face. The pain of shingles usually lasts about 1 month. However, some people with shingles may develop long-term (chronic) pain in the affected area of the body. Shingles often occurs many years after the person had chickenpox. It is more common:  In people older than 50 years.  In people with weakened immune systems, such as those with HIV, AIDS, or cancer.  In people taking medicines that weaken the immune system, such as transplant medicines.  In people under great stress. CAUSES  Shingles is caused by the varicella zoster virus (VZV), which also causes chickenpox. After a person is infected with the virus, it can remain in the person's body for years in an inactive state (dormant). To cause shingles, the virus reactivates and breaks out as an infection in a nerve root. The virus can be spread from person to person (contagious) through contact with open blisters of the shingles rash. It will only spread to people who have not had chickenpox. When these people are exposed to the virus, they may develop chickenpox. They will not develop shingles. Once the blisters scab over, the person is no longer  contagious and cannot spread the virus to others. SYMPTOMS  Shingles shows up in stages. The initial symptoms may be pain, itching, and tingling in an area of the skin. This pain is usually described as burning, stabbing, or throbbing.In a few days or weeks, a painful red rash will appear in the area where the pain, itching, and tingling were felt. The rash is usually on one side of the body in a band or belt-like pattern. Then, the rash usually turns into fluid-filled blisters. They will scab over and dry up in approximately 2 3 weeks. Flu-like symptoms may also occur with the initial symptoms, the rash, or the blisters. These may include:  Fever.  Chills.  Headache.  Upset stomach. DIAGNOSIS  Your caregiver will perform a skin exam to diagnose shingles. Skin scrapings or fluid samples may also be taken from the blisters. This sample will be examined under a microscope or sent to a lab for further testing. TREATMENT  There is no specific cure for shingles. Your caregiver will likely prescribe medicines to help you manage the pain, recover faster, and avoid long-term problems. This may include antiviral drugs, anti-inflammatory drugs, and pain medicines. HOME CARE INSTRUCTIONS   Take a cool bath or apply cool compresses to the area of the rash or blisters as directed. This may help with the pain and itching.   Only take over-the-counter or prescription medicines as directed by your caregiver.   Rest as directed by your caregiver.  Keep your rash and blisters clean with mild soap and cool water or  as directed by your caregiver.  Do not pick your blisters or scratch your rash. Apply an anti-itch cream or numbing creams to the affected area as directed by your caregiver.  Keep your shingles rash covered with a loose bandage (dressing).  Avoid skin contact with:  Babies.   Pregnant women.   Children with eczema.   Elderly people with transplants.   People with chronic  illnesses, such as leukemia or AIDS.   Wear loose-fitting clothing to help ease the pain of material rubbing against the rash.  Keep all follow-up appointments with your caregiver.If the area involved is on your face, you may receive a referral for follow-up to a specialist, such as an eye doctor (ophthalmologist) or an ear, nose, and throat (ENT) doctor. Keeping all follow-up appointments will help you avoid eye complications, chronic pain, or disability.  SEEK IMMEDIATE MEDICAL CARE IF:   You have facial pain, pain around the eye area, or loss of feeling on one side of your face.  You have ear pain or ringing in your ear.  You have loss of taste.  Your pain is not relieved with prescribed medicines.   Your redness or swelling spreads.   You have more pain and swelling.  Your condition is worsening or has changed.   You have a feveror persistent symptoms for more than 2 3 days.  You have a fever and your symptoms suddenly get worse. MAKE SURE YOU:  Understand these instructions.  Will watch your condition.  Will get help right away if you are not doing well or get worse. Document Released: 07/10/2005 Document Revised: 04/03/2012 Document Reviewed: 02/22/2012 [facility] Patient Information 2014 [place].

## 2013-11-25 NOTE — ED Provider Notes (Signed)
Medical screening examination/treatment/procedure(s) were performed by non-physician practitioner and as supervising physician I was immediately available for consultation/collaboration.   EKG Interpretation None       Threasa Beards, MD 11/25/13 [phone]

## 2013-11-25 NOTE — ED Provider Notes (Signed)
Medical screening examination/treatment/procedure(s) were performed by non-physician practitioner and as supervising physician I was immediately available for consultation/collaboration.   EKG Interpretation None       Varney Biles, MD 11/25/13 2242

## 2017-04-02 ENCOUNTER — Emergency Department (HOSPITAL_COMMUNITY)
Admission: EM | Admit: 2017-04-02 | Discharge: 2017-04-02 | Disposition: A | Discharge status: Home / Self Care | Payer: Non-veteran care | Attending: Emergency Medicine | Admitting: Emergency Medicine

## 2017-04-02 ENCOUNTER — Encounter (HOSPITAL_COMMUNITY): Payer: Self-pay | Admitting: *Deleted

## 2017-04-02 DIAGNOSIS — Z5321 Procedure and treatment not carried out due to patient leaving prior to being seen by health care provider: Secondary | ICD-10-CM | POA: Insufficient documentation

## 2017-04-02 DIAGNOSIS — M549 Dorsalgia, unspecified: Secondary | ICD-10-CM | POA: Insufficient documentation
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
## 2017-04-02 NOTE — ED Notes (Signed)
No response from pt in lobby to be placed in room.

## 2017-04-03 ENCOUNTER — Emergency Department (HOSPITAL_COMMUNITY)
Admission: EM | Admit: 2017-04-03 | Discharge: 2017-04-03 | Disposition: A | Discharge status: Home / Self Care | Payer: No Typology Code available for payment source | Attending: Emergency Medicine | Admitting: Emergency Medicine

## 2017-04-03 ENCOUNTER — Emergency Department (HOSPITAL_COMMUNITY): Payer: No Typology Code available for payment source

## 2017-04-03 ENCOUNTER — Encounter (HOSPITAL_COMMUNITY): Payer: Self-pay

## 2017-04-03 DIAGNOSIS — S161XXA Strain of muscle, fascia and tendon at neck level, initial encounter: Secondary | ICD-10-CM | POA: Insufficient documentation

## 2017-04-03 DIAGNOSIS — Y9389 Activity, other specified: Secondary | ICD-10-CM | POA: Insufficient documentation

## 2017-04-03 DIAGNOSIS — Z79899 Other long term (current) drug therapy: Secondary | ICD-10-CM | POA: Diagnosis not present

## 2017-04-03 DIAGNOSIS — F17210 Nicotine dependence, cigarettes, uncomplicated: Secondary | ICD-10-CM | POA: Diagnosis not present

## 2017-04-03 DIAGNOSIS — Y92481 Parking lot as the place of occurrence of the external cause: Secondary | ICD-10-CM | POA: Insufficient documentation

## 2017-04-03 DIAGNOSIS — S199XXA Unspecified injury of neck, initial encounter: Secondary | ICD-10-CM | POA: Diagnosis present

## 2017-04-03 DIAGNOSIS — S39012A Strain of muscle, fascia and tendon of lower back, initial encounter: Secondary | ICD-10-CM | POA: Insufficient documentation

## 2017-04-03 DIAGNOSIS — I10 Essential (primary) hypertension: Secondary | ICD-10-CM | POA: Diagnosis not present

## 2017-04-03 DIAGNOSIS — Y999 Unspecified external cause status: Secondary | ICD-10-CM | POA: Insufficient documentation
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
## 2017-04-03 MED ORDER — CYCLOBENZAPRINE HCL 10 MG PO TABS
10.0000 mg | ORAL_TABLET | Freq: Once | ORAL | Status: AC
  Administered 2017-04-03: 10 mg via ORAL
  Filled 2017-04-03: qty 1

## 2017-04-03 MED ORDER — CYCLOBENZAPRINE HCL 10 MG PO TABS: 10.0000 mg | ORAL_TABLET | Freq: Two times a day (BID) | ORAL | 0 refills | Status: DC | PRN

## 2017-04-03 MED ORDER — PREDNISONE 10 MG PO TABS: 20.0000 mg | ORAL_TABLET | Freq: Two times a day (BID) | ORAL | 0 refills | Status: DC
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
## 2017-04-03 NOTE — ED Notes (Signed)
Pt in radiology 

## 2017-04-03 NOTE — Discharge Instructions (Signed)
The muscle relaxant can make you sleepy. Do not do any activity while taking the muscle relaxant that may cause injury. If your symptoms persist follow up with Dr. Erlinda Hong.

## 2017-04-10 ENCOUNTER — Ambulatory Visit (INDEPENDENT_AMBULATORY_CARE_PROVIDER_SITE_OTHER): Payer: Self-pay | Admitting: Orthopaedic Surgery

## 2017-04-17 ENCOUNTER — Ambulatory Visit (INDEPENDENT_AMBULATORY_CARE_PROVIDER_SITE_OTHER): Payer: Self-pay | Admitting: Orthopaedic Surgery

## 2018-03-27 ENCOUNTER — Emergency Department (HOSPITAL_COMMUNITY)
Admission: EM | Admit: 2018-03-27 | Discharge: 2018-03-27 | Disposition: A | Discharge status: Home / Self Care | Payer: Non-veteran care | Attending: Emergency Medicine | Admitting: Emergency Medicine

## 2018-03-27 DIAGNOSIS — Z79899 Other long term (current) drug therapy: Secondary | ICD-10-CM | POA: Diagnosis not present

## 2018-03-27 DIAGNOSIS — N410 Acute prostatitis: Secondary | ICD-10-CM | POA: Diagnosis not present

## 2018-03-27 DIAGNOSIS — R300 Dysuria: Secondary | ICD-10-CM | POA: Diagnosis present

## 2018-03-27 DIAGNOSIS — I10 Essential (primary) hypertension: Secondary | ICD-10-CM | POA: Insufficient documentation

## 2018-03-27 DIAGNOSIS — F17210 Nicotine dependence, cigarettes, uncomplicated: Secondary | ICD-10-CM | POA: Diagnosis not present

## 2018-03-27 LAB — URINALYSIS, ROUTINE W REFLEX MICROSCOPIC
Bilirubin Urine: NEGATIVE
Glucose, UA: NEGATIVE mg/dL
Ketones, ur: NEGATIVE mg/dL
Nitrite: NEGATIVE
Protein, ur: 30 mg/dL — AB
Specific Gravity, Urine: 1.025 (ref 1.005–1.030)
WBC, UA: >50 WBC/hpf — ABNORMAL HIGH (ref 0–5)
pH: 5 (ref 5.0–8.0)

## 2018-03-27 LAB — CBC
HCT: 42.7 % (ref 39.0–52.0)
Hemoglobin: 13.5 g/dL (ref 13.0–17.0)
MCH: 27.6 pg (ref 26.0–34.0)
MCHC: 31.6 g/dL (ref 30.0–36.0)
MCV: 87.3 fL (ref 78.0–100.0)
Platelets: 308 10*3/uL (ref 150–400)
RBC: 4.89 MIL/uL (ref 4.22–5.81)
RDW: 13.7 % (ref 11.5–15.5)
WBC: 8.6 10*3/uL (ref 4.0–10.5)

## 2018-03-27 LAB — BASIC METABOLIC PANEL
Anion gap: 10 (ref 5–15)
BUN: 20 mg/dL (ref 8–23)
CO2: 21 mmol/L — ABNORMAL LOW (ref 22–32)
Calcium: 9.8 mg/dL (ref 8.9–10.3)
Chloride: 108 mmol/L (ref 98–111)
Creatinine, Ser: 1.42 mg/dL — ABNORMAL HIGH (ref 0.61–1.24)
GFR calc Af Amer: 59 mL/min — ABNORMAL LOW (ref >60)
GFR calc non Af Amer: 51 mL/min — ABNORMAL LOW (ref >60)
Glucose, Bld: 149 mg/dL — ABNORMAL HIGH (ref 70–99)
Potassium: 3.5 mmol/L (ref 3.5–5.1)
Sodium: 139 mmol/L (ref 135–145)

## 2018-03-27 MED ORDER — CIPROFLOXACIN HCL 500 MG PO TABS: 500.0000 mg | ORAL_TABLET | Freq: Two times a day (BID) | ORAL | 0 refills | Status: DC

## 2018-03-27 MED ORDER — CIPROFLOXACIN HCL 500 MG PO TABS
500.0000 mg | ORAL_TABLET | Freq: Once | ORAL | Status: AC
  Administered 2018-03-27: 500 mg via ORAL
  Filled 2018-03-27: qty 1

## 2018-03-27 MED ORDER — CEPHALEXIN 250 MG PO CAPS: 1000.0000 mg | ORAL_CAPSULE | Freq: Once | ORAL | Status: DC

## 2018-03-27 MED ORDER — CEPHALEXIN 500 MG PO CAPS: 1000.0000 mg | ORAL_CAPSULE | Freq: Two times a day (BID) | ORAL | 0 refills | Status: DC

## 2018-03-27 NOTE — ED Triage Notes (Signed)
Patient to ED c/o urinary frequency, pain with urination x 2 weeks. Patient states he normally goes to the [place], but hasn't had transportation there recently. He states the last time he spoke with the doctor, he was told he needed to have his kidney function tested. Denies fevers/chills.

## 2018-03-29 LAB — URINE CULTURE: Culture: 20000 — AB

## 2018-03-30 ENCOUNTER — Telehealth: Payer: Self-pay

## 2018-03-30 NOTE — Telephone Encounter (Signed)
No treatment for UC ED 03/27/18 per Joline Maxcy PAC

## 2018-04-06 NOTE — ED Provider Notes (Signed)
[name]   CSN: [id number] Arrival date & time: 03/27/18  0945     History   Chief Complaint Chief Complaint  Patient presents with  . Dysuria    HPI Randy Andersen is a 62 y.o. male.  HPI  62 year old male with increased urinary frequency and dysuria for couple weeks.  He reports that he was recently treated for UTI and his symptoms improved while on antibiotics.  They returned when they were stopped.  He is not sure what he was taking previously.  He describes a pressure low on his abdomen/perineum and increasing pain when he pees.  Urine has been darker in color.  No fevers or chills.  Care is typically through the [place] system.  Past Medical History:  Diagnosis Date  . Chronic pain disorder    LT leg and foot  . Heart murmur   . Hypertension     Patient Active Problem List   Diagnosis Date Noted  . Fracture of tibial plateau, closed 10/07/2012  . Fracture of clavicle, left, closed 10/07/2012  . Chronic pain disorder     Past Surgical History:  Procedure Laterality Date  . LEG SURGERY Left    PT reports he has pins placed in his Lt leg  . ORIF TIBIA PLATEAU  10/09/2012   Dr Lorin Mercy  . ORIF TIBIA PLATEAU Left 10/08/2012   Procedure: OPEN REDUCTION INTERNAL FIXATION (ORIF) TIBIAL PLATEAU;  Surgeon: Marybelle Killings, MD;  Location: [facility];  Service: Orthopedics;  Laterality: Left;        Home Medications    Prior to Admission medications   Medication Sig Start Date End Date Taking? Authorizing Provider  acetaminophen (TYLENOL) 500 MG tablet Take 1,000 mg by mouth every 6 (six) hours as needed for moderate pain.     [provider]  acyclovir (ZOVIRAX) 800 MG tablet Take 1 tablet (800 mg total) by mouth 5 (five) times daily. X 7 days 11/24/13   West, Emily, PA-C  ciprofloxacin (CIPRO) 500 MG tablet Take 1 tablet (500 mg total) by mouth 2 (two) times daily. 03/27/18   Virgel Manifold, MD  cyclobenzaprine (FLEXERIL) 10  MG tablet Take 1 tablet (10 mg total) by mouth 2 (two) times daily as needed for muscle spasms. 04/03/17   Ashley Murrain, NP  docusate sodium (COLACE) 100 MG capsule Take 1 capsule (100 mg total) by mouth 2 (two) times daily. 11/16/13   Kingsley Spittle, MD  gabapentin (NEURONTIN) 100 MG capsule Take 1 capsule (100 mg total) by mouth 3 (three) times daily. 11/24/13   Clayton Bibles, PA-C  lisinopril (PRINIVIL,ZESTRIL) 10 MG tablet Take 5 mg by mouth daily.    [provider]  predniSONE (DELTASONE) 10 MG tablet Take 2 tablets (20 mg total) by mouth 2 (two) times daily with a meal. 04/03/17   Ashley Murrain, NP    Family History No family history on file.  Social History Social History   Tobacco Use  . Smoking status: Current Every Day Smoker    Years: 28.00    Types: Cigarettes    Last attempt to quit: 10/07/2012    Years since quitting: 5.4  . Smokeless tobacco: Never Used  Substance Use Topics  . Alcohol use: No  . Drug use: No     Allergies   Patient has no known allergies.   Review of Systems Review of Systems  All systems reviewed and negative, other than as noted in HPI.  Physical Exam Updated Vital Signs BP (!) 176/104 (BP Location: Right Arm)   Pulse 88   Temp 98.6 F (37 C) (Oral)   Resp 16   Ht 6\' 2"  (1.88 m)   Wt 88.5 kg   SpO2 97%   BMI 25.04 kg/m   Physical Exam  Constitutional: He appears well-developed and well-nourished. No distress.  HENT:  Head: Normocephalic and atraumatic.  Eyes: Conjunctivae are normal. Right eye exhibits no discharge. Left eye exhibits no discharge.  Neck: Neck supple.  Cardiovascular: Normal rate, regular rhythm and normal heart sounds. Exam reveals no gallop and no friction rub.  No murmur heard. Pulmonary/Chest: Effort normal and breath sounds normal. No respiratory distress.  Abdominal: Soft. He exhibits no distension. There is tenderness.  Suprapubic tenderness without rebound or guarding.  Musculoskeletal: He exhibits  no edema or tenderness.  Neurological: He is alert.  Skin: Skin is warm and dry.  Psychiatric: He has a normal mood and affect. His behavior is normal. Thought content normal.  Nursing note and vitals reviewed.    ED Treatments / Results  Labs (all labs ordered are listed, but only abnormal results are displayed) Labs Reviewed  URINE CULTURE - Abnormal; Notable for the following components:      Result Value   Culture   (*)    Value: 20,000 COLONIES/mL METHICILLIN RESISTANT STAPHYLOCOCCUS AUREUS   Organism ID, Bacteria METHICILLIN RESISTANT STAPHYLOCOCCUS AUREUS (*)    All other components within normal limits  URINALYSIS, ROUTINE W REFLEX MICROSCOPIC - Abnormal; Notable for the following components:   APPearance CLOUDY (*)    Hgb urine dipstick LARGE (*)    Protein, ur 30 (*)    Leukocytes, UA LARGE (*)    WBC, UA >50 (*)    Bacteria, UA FEW (*)    All other components within normal limits  BASIC METABOLIC PANEL - Abnormal; Notable for the following components:   CO2 21 (*)    Glucose, Bld 149 (*)    Creatinine, Ser 1.42 (*)    GFR calc non Af Amer 51 (*)    GFR calc Af Amer 59 (*)    All other components within normal limits  CBC    EKG None  Radiology No results found.  Procedures Procedures (including critical care time)  Medications Ordered in ED Medications  ciprofloxacin (CIPRO) tablet 500 mg (500 mg Oral Given 03/27/18 1113)     Initial Impression / Assessment and Plan / ED Course  I have reviewed the triage vital signs and the nursing notes.  Pertinent labs & imaging results that were available during my care of the patient were reviewed by me and considered in my medical decision making (see chart for details).     62 year old male with continued symptoms consistent with urinary tract infection.  Suspect there may be some component of prostatitis.  Will place him on ciprofloxacin for 2 weeks.  Outpatient follow-up at the [facility] otherwise.  Final  Clinical Impressions(s) / ED Diagnoses   Final diagnoses:  Acute prostatitis    ED Discharge Orders         Ordered    cephALEXin (KEFLEX) 500 MG capsule  2 times daily,   Status:  Discontinued     03/27/18 1051    ciprofloxacin (CIPRO) 500 MG tablet  2 times daily     03/27/18 1105           Virgel Manifold, MD 04/06/18 1211

## 2020-02-04 ENCOUNTER — Encounter: Payer: Self-pay | Admitting: Emergency Medicine

## 2020-02-04 ENCOUNTER — Institutional Professional Consult (permissible substitution): Payer: Non-veteran care | Admitting: Emergency Medicine

## 2020-02-04 ENCOUNTER — Telehealth: Payer: Self-pay | Admitting: Emergency Medicine

## 2020-02-04 NOTE — Telephone Encounter (Signed)
Dr. Lamonte Sakai there is nothing available and 8/10 is his original appt. Unless you want to double book.

## 2020-02-04 NOTE — Telephone Encounter (Signed)
Spoke with pt, he is willing to come in on either of those days. Patrice can you make a slot for pt or should I just double book at [place] and tell him to come at 8:30. Please advise.

## 2020-02-04 NOTE — Telephone Encounter (Signed)
I checked Dr. Agustina Caroli folder and his disk was not in there. Dr. Lamonte Sakai do you remember seeing disk with his images?

## 2020-02-04 NOTE — Telephone Encounter (Signed)
Yes I received.  He rescheduled his appt today for sometime in August. I'd like to try to see him sooner than that -- can try to work him into any of my blocked "pulmonary nodule" slots

## 2020-02-04 NOTE — Telephone Encounter (Signed)
You could put him with me on 7/29 or 30 at 8:30 am if it works for the office staff to start early.

## 2020-02-09 NOTE — Telephone Encounter (Signed)
Spoke to pt and scheduled him for 02/19/2020 @ 8:30am -pr

## 2020-02-18 ENCOUNTER — Ambulatory Visit
Admission: RE | Admit: 2020-02-18 | Discharge: 2020-02-18 | Disposition: A | Discharge status: Home / Self Care | Payer: Self-pay | Source: Ambulatory Visit | Attending: Radiation Oncology | Admitting: Radiation Oncology

## 2020-02-18 ENCOUNTER — Other Ambulatory Visit: Payer: Self-pay | Admitting: Radiation Oncology

## 2020-02-18 DIAGNOSIS — C3490 Malignant neoplasm of unspecified part of unspecified bronchus or lung: Secondary | ICD-10-CM

## 2020-02-18 DIAGNOSIS — C7931 Secondary malignant neoplasm of brain: Secondary | ICD-10-CM

## 2020-02-19 ENCOUNTER — Telehealth: Payer: Self-pay | Admitting: Emergency Medicine

## 2020-02-19 ENCOUNTER — Encounter: Payer: Self-pay | Admitting: Emergency Medicine

## 2020-02-19 ENCOUNTER — Other Ambulatory Visit: Payer: Self-pay

## 2020-02-19 ENCOUNTER — Ambulatory Visit (INDEPENDENT_AMBULATORY_CARE_PROVIDER_SITE_OTHER): Payer: No Typology Code available for payment source | Admitting: Emergency Medicine

## 2020-02-19 VITALS — BP 124/76 | HR 80 | Temp 98.6°F | Ht 73.0 in | Wt 179.0 lb

## 2020-02-19 DIAGNOSIS — R911 Solitary pulmonary nodule: Secondary | ICD-10-CM | POA: Insufficient documentation

## 2020-02-19 DIAGNOSIS — R590 Localized enlarged lymph nodes: Secondary | ICD-10-CM

## 2020-02-19 DIAGNOSIS — C3411 Malignant neoplasm of upper lobe, right bronchus or lung: Secondary | ICD-10-CM | POA: Insufficient documentation

## 2020-02-19 LAB — CBC
HCT: 41.4 % (ref 39.0–52.0)
Hemoglobin: 13.8 g/dL (ref 13.0–17.0)
MCHC: 33.4 g/dL (ref 30.0–36.0)
MCV: 86.3 fl (ref 78.0–100.0)
Platelets: 233 10*3/uL (ref 150.0–400.0)
RBC: 4.8 Mil/uL (ref 4.22–5.81)
RDW: 13.8 % (ref 11.5–15.5)
WBC: 5.9 10*3/uL (ref 4.0–10.5)

## 2020-02-19 LAB — BASIC METABOLIC PANEL
BUN: 15 mg/dL (ref 6–23)
CO2: 29 mEq/L (ref 19–32)
Calcium: 9.7 mg/dL (ref 8.4–10.5)
Chloride: 106 mEq/L (ref 96–112)
Creatinine, Ser: 1.5 mg/dL (ref 0.40–1.50)
GFR: 56.8 mL/min — ABNORMAL LOW (ref >60.00)
Glucose, Bld: 100 mg/dL — ABNORMAL HIGH (ref 70–99)
Potassium: 3.5 mEq/L (ref 3.5–5.1)
Sodium: 139 mEq/L (ref 135–145)

## 2020-02-19 LAB — APTT: aPTT: 32.4 s (ref 23.4–32.7)

## 2020-02-19 LAB — PROTIME-INR
INR: 1.1 ratio — ABNORMAL HIGH (ref 0.8–1.0)
Prothrombin Time: 12.1 s (ref 9.6–13.1)

## 2020-02-19 NOTE — Addendum Note (Signed)
Addended by: Suzzanne Cloud E on: 02/19/2020 03:04 PM   Modules accepted: Orders

## 2020-02-19 NOTE — Patient Instructions (Signed)
We will arrange bronchoscopy to evaluate and biopsy your pulmonary nodule.  Hopefully we can get this done in the next 1 to 2 weeks. We will arrange for a repeat CT scan of your chest to help facilitate your biopsy Lab work today. Follow with Dr Lamonte Sakai in 1 month

## 2020-02-19 NOTE — H&P (View-Only) (Signed)
Subjective:    Patient ID: Randy Andersen, male    DOB: [birth year], 64 y.o.   MRN: [id number]  HPI 64 year old smoker (25 pack years) with a history of hypertension, followed at the [place].  He was found to have a posterior right upper lobe pulmonary nodule on CT scan of the chest.  A PET scan that was done on 01/30/2020 revealed that the nodule was hypermetabolic, also revealed some hypermetabolic mediastinal lymphadenopathy, hypermetabolic 1.5 cm inferior left thyroid nodule, 1.4 x 1 cm soft tissue nodule in the subcutaneous fat of the left axilla.  There are multiple small pulmonary nodules along the right major minor fissure new from 2017.  MRI Brain 01/23/20 >> 0.3cm enhancing nodule in L cerebral hemisphere.   Pt denies any complaints, he does have occasional cough, prod of tan sputum, no hemoptysis. No pain or SOB. No ataxia.    Review of Systems As per HPI  Past Medical History:  Diagnosis Date  . Chronic pain disorder    LT leg and foot  . Heart murmur   . Hypertension      No family history on file.   Social History   Socioeconomic History  . Marital status: Single    Spouse name: Not on file  . Number of children: Not on file  . Years of education: Not on file  . Highest education level: Not on file  Occupational History  . Not on file  Tobacco Use  . Smoking status: Current Every Day Smoker    Years: 28.00    Types: Cigarettes    Last attempt to quit: 10/07/2012    Years since quitting: 7.3  . Smokeless tobacco: Never Used  . Tobacco comment: 7 cigatettes daily 02/19/20 [facility]   Substance and Sexual Activity  . Alcohol use: No  . Drug use: No  . Sexual activity: Not on file  Other Topics Concern  . Not on file  Social History Narrative  . Not on file   Social Determinants of Health   Financial Resource Strain:   . Difficulty of Paying Living Expenses:   Food Insecurity:   . Worried About Charity fundraiser in the Last Year:   . Arboriculturist in the Last  Year:   Transportation Needs:   . Film/video editor (Medical):   Marland Kitchen Lack of Transportation (Non-Medical):   Physical Activity:   . Days of Exercise per Week:   . Minutes of Exercise per Session:   Stress:   . Feeling of Stress :   Social Connections:   . Frequency of Communication with Friends and Family:   . Frequency of Social Gatherings with Friends and Family:   . Attends Religious Services:   . Active Member of Clubs or Organizations:   . Attends Archivist Meetings:   Marland Kitchen Marital Status:   Intimate Partner Violence:   . Fear of Current or Ex-Partner:   . Emotionally Abused:   Marland Kitchen Physically Abused:   . Sexually Abused:      Works in [employer] in [place], [place], [place], [place] Was in [employer], worked in Engineer, materials.   No Known Allergies   Outpatient Medications Prior to Visit  Medication Sig Dispense Refill  . lisinopril (PRINIVIL,ZESTRIL) 10 MG tablet Take 5 mg by mouth daily.    Marland Kitchen acetaminophen (TYLENOL) 500 MG tablet Take 1,000 mg by mouth every 6 (six) hours as needed for moderate pain.  (Patient not taking: Reported on 02/19/2020)    .  acyclovir (ZOVIRAX) 800 MG tablet Take 1 tablet (800 mg total) by mouth 5 (five) times daily. X 7 days (Patient not taking: Reported on 02/19/2020) 50 tablet 0  . ciprofloxacin (CIPRO) 500 MG tablet Take 1 tablet (500 mg total) by mouth 2 (two) times daily. (Patient not taking: Reported on 02/19/2020) 30 tablet 0  . cyclobenzaprine (FLEXERIL) 10 MG tablet Take 1 tablet (10 mg total) by mouth 2 (two) times daily as needed for muscle spasms. (Patient not taking: Reported on 02/19/2020) 20 tablet 0  . docusate sodium (COLACE) 100 MG capsule Take 1 capsule (100 mg total) by mouth 2 (two) times daily. (Patient not taking: Reported on 02/19/2020) 30 capsule 0  . gabapentin (NEURONTIN) 100 MG capsule Take 1 capsule (100 mg total) by mouth 3 (three) times daily. (Patient not taking: Reported on 02/19/2020) 60 capsule 0  . predniSONE (DELTASONE) 10 MG  tablet Take 2 tablets (20 mg total) by mouth 2 (two) times daily with a meal. (Patient not taking: Reported on 02/19/2020) 16 tablet 0   No facility-administered medications prior to visit.        Objective:   Physical Exam  Vitals:   02/19/20 0826  BP: 124/76  Pulse: 80  Temp: 98.6 F (37 C)  TempSrc: Oral  SpO2: 99%  Weight: 179 lb (81.2 kg)  Height: 6' 1" (1.854 m)   Gen: Pleasant, well-nourished, in no distress,  normal affect  ENT: No lesions,  mouth clear,  oropharynx clear, no postnasal drip  Neck: No JVD, no stridor  Lungs: No use of accessory muscles, no crackles or wheezing on normal respiration, no wheeze on forced expiration  Cardiovascular: RRR, heart sounds normal, no murmur or gallops, no peripheral edema  Musculoskeletal: No deformities, no cyanosis or clubbing  Neuro: alert, awake, non focal  Skin: Warm, no lesions or rash      Assessment & Plan:  Pulmonary nodule Asymptomatic patient with  25-pack-year tobacco history, found to have a right upper lobe nodule.  MRI brain has shown a small focus consistent with a probable met.  He also has hypermetabolic mediastinal lymphadenopathy on PET scan.  He needs a tissue diagnosis ASAP.  I will arrange for navigational bronchoscopy plus EBUS at [facility].  He will need a repeat CT chest to facilitate.  We will order this today.  Lab work today.   Napolean Sia, MD, PhD 02/19/2020, 9:05 AM [facility] [phone] or if no answer [phone]  

## 2020-02-19 NOTE — Telephone Encounter (Signed)
Thanks

## 2020-02-19 NOTE — Assessment & Plan Note (Signed)
Asymptomatic patient with  25-pack-year tobacco history, found to have a right upper lobe nodule.  MRI brain has shown a small focus consistent with a probable met.  He also has hypermetabolic mediastinal lymphadenopathy on PET scan.  He needs a tissue diagnosis ASAP.  I will arrange for navigational bronchoscopy plus EB[facility] at [facility].  He will need a repeat CT chest to facilitate.  We will order this today.  Lab work today.

## 2020-02-19 NOTE — Telephone Encounter (Signed)
Pt has been scheduled for 8/4 at 1:30 in [facility] OR 10.  He will have CT on 8/2 at 8:45 & covid test after.  LBCT has spoken to [place] & she will come & pick up disk Monday after CT.  I am having to verify CT will be covered with [facility].  I am waiting for call back.

## 2020-02-19 NOTE — Addendum Note (Signed)
Addended by: Gavin Potters R on: 02/19/2020 10:49 AM   Modules accepted: Orders

## 2020-02-19 NOTE — Telephone Encounter (Signed)
I have verified CT is covered under the [place] authorization.

## 2020-02-19 NOTE — Progress Notes (Signed)
Subjective:    Patient ID: Randy Andersen, male    DOB: 11-17-1955, 64 y.o.   MRN: [id number]  HPI 64 year old smoker (25 pack years) with a history of hypertension, followed at the [place].  He was found to have a posterior right upper lobe pulmonary nodule on CT scan of the chest.  A PET scan that was done on 01/30/2020 revealed that the nodule was hypermetabolic, also revealed some hypermetabolic mediastinal lymphadenopathy, hypermetabolic 1.5 cm inferior left thyroid nodule, 1.4 x 1 cm soft tissue nodule in the subcutaneous fat of the left axilla.  There are multiple small pulmonary nodules along the right major minor fissure new from 2017.  MRI Brain 01/23/20 >> 0.3cm enhancing nodule in L cerebral hemisphere.   Pt denies any complaints, he does have occasional cough, prod of tan sputum, no hemoptysis. No pain or SOB. No ataxia.    Review of Systems As per HPI  Past Medical History:  Diagnosis Date  . Chronic pain disorder    LT leg and foot  . Heart murmur   . Hypertension      No family history on file.   Social History   Socioeconomic History  . Marital status: Single    Spouse name: Not on file  . Number of children: Not on file  . Years of education: Not on file  . Highest education level: Not on file  Occupational History  . Not on file  Tobacco Use  . Smoking status: Current Every Day Smoker    Years: 28.00    Types: Cigarettes    Last attempt to quit: 10/07/2012    Years since quitting: 7.3  . Smokeless tobacco: Never Used  . Tobacco comment: 7 cigatettes daily 02/19/20 ARJ   Substance and Sexual Activity  . Alcohol use: No  . Drug use: No  . Sexual activity: Not on file  Other Topics Concern  . Not on file  Social History Narrative  . Not on file   Social Determinants of Health   Financial Resource Strain:   . Difficulty of Paying Living Expenses:   Food Insecurity:   . Worried About Charity fundraiser in the Last Year:   . Arboriculturist in the Last  Year:   Transportation Needs:   . Film/video editor (Medical):   Marland Kitchen Lack of Transportation (Non-Medical):   Physical Activity:   . Days of Exercise per Week:   . Minutes of Exercise per Session:   Stress:   . Feeling of Stress :   Social Connections:   . Frequency of Communication with Friends and Family:   . Frequency of Social Gatherings with Friends and Family:   . Attends Religious Services:   . Active Member of Clubs or Organizations:   . Attends Archivist Meetings:   Marland Kitchen Marital Status:   Intimate Partner Violence:   . Fear of Current or Ex-Partner:   . Emotionally Abused:   Marland Kitchen Physically Abused:   . Sexually Abused:      Works in [employer] in [place], [place], [place], [place] Was in [employer], worked in Engineer, materials.   No Known Allergies   Outpatient Medications Prior to Visit  Medication Sig Dispense Refill  . lisinopril (PRINIVIL,ZESTRIL) 10 MG tablet Take 5 mg by mouth daily.    Marland Kitchen acetaminophen (TYLENOL) 500 MG tablet Take 1,000 mg by mouth every 6 (six) hours as needed for moderate pain.  (Patient not taking: Reported on 02/19/2020)    .  acyclovir (ZOVIRAX) 800 MG tablet Take 1 tablet (800 mg total) by mouth 5 (five) times daily. X 7 days (Patient not taking: Reported on 02/19/2020) 50 tablet 0  . ciprofloxacin (CIPRO) 500 MG tablet Take 1 tablet (500 mg total) by mouth 2 (two) times daily. (Patient not taking: Reported on 02/19/2020) 30 tablet 0  . cyclobenzaprine (FLEXERIL) 10 MG tablet Take 1 tablet (10 mg total) by mouth 2 (two) times daily as needed for muscle spasms. (Patient not taking: Reported on 02/19/2020) 20 tablet 0  . docusate sodium (COLACE) 100 MG capsule Take 1 capsule (100 mg total) by mouth 2 (two) times daily. (Patient not taking: Reported on 02/19/2020) 30 capsule 0  . gabapentin (NEURONTIN) 100 MG capsule Take 1 capsule (100 mg total) by mouth 3 (three) times daily. (Patient not taking: Reported on 02/19/2020) 60 capsule 0  . predniSONE (DELTASONE) 10 MG  tablet Take 2 tablets (20 mg total) by mouth 2 (two) times daily with a meal. (Patient not taking: Reported on 02/19/2020) 16 tablet 0   No facility-administered medications prior to visit.        Objective:   Physical Exam  Vitals:   02/19/20 0826  BP: 124/76  Pulse: 80  Temp: 98.6 F (37 C)  TempSrc: Oral  SpO2: 99%  Weight: 179 lb (81.2 kg)  Height: 6' 1" (1.854 m)   Gen: Pleasant, well-nourished, in no distress,  normal affect  ENT: No lesions,  mouth clear,  oropharynx clear, no postnasal drip  Neck: No JVD, no stridor  Lungs: No use of accessory muscles, no crackles or wheezing on normal respiration, no wheeze on forced expiration  Cardiovascular: RRR, heart sounds normal, no murmur or gallops, no peripheral edema  Musculoskeletal: No deformities, no cyanosis or clubbing  Neuro: alert, awake, non focal  Skin: Warm, no lesions or rash      Assessment & Plan:  Pulmonary nodule Asymptomatic patient with  25-pack-year tobacco history, found to have a right upper lobe nodule.  MRI brain has shown a small focus consistent with a probable met.  He also has hypermetabolic mediastinal lymphadenopathy on PET scan.  He needs a tissue diagnosis ASAP.  I will arrange for navigational bronchoscopy plus EBUS at [facility].  He will need a repeat CT chest to facilitate.  We will order this today.  Lab work today.   Baltazar Apo, MD, PhD 02/19/2020, 9:05 AM [facility] [phone] or if no answer [phone]

## 2020-02-20 ENCOUNTER — Telehealth: Payer: Self-pay | Admitting: Emergency Medicine

## 2020-02-20 NOTE — Telephone Encounter (Signed)
LMTCB x 1 

## 2020-02-20 NOTE — Telephone Encounter (Signed)
Error

## 2020-02-20 NOTE — Telephone Encounter (Signed)
Pt called back, please return call  

## 2020-02-20 NOTE — Telephone Encounter (Signed)
I called and spoke with the pt and reminded him of appt dates and times for bronch, covid test and ct scan. Nothing further needed.

## 2020-02-23 ENCOUNTER — Telehealth: Payer: Self-pay | Admitting: Emergency Medicine

## 2020-02-23 ENCOUNTER — Ambulatory Visit (INDEPENDENT_AMBULATORY_CARE_PROVIDER_SITE_OTHER)
Admission: RE | Admit: 2020-02-23 | Discharge: 2020-02-23 | Disposition: A | Discharge status: Home / Self Care | Payer: No Typology Code available for payment source | Source: Ambulatory Visit | Attending: Emergency Medicine | Admitting: Emergency Medicine

## 2020-02-23 ENCOUNTER — Other Ambulatory Visit (HOSPITAL_COMMUNITY)
Admission: RE | Admit: 2020-02-23 | Discharge: 2020-02-23 | Disposition: A | Discharge status: Home / Self Care | Payer: No Typology Code available for payment source | Source: Ambulatory Visit | Attending: Emergency Medicine | Admitting: Emergency Medicine

## 2020-02-23 ENCOUNTER — Other Ambulatory Visit: Payer: Self-pay

## 2020-02-23 DIAGNOSIS — Z01812 Encounter for preprocedural laboratory examination: Secondary | ICD-10-CM | POA: Diagnosis present

## 2020-02-23 DIAGNOSIS — Z20822 Contact with and (suspected) exposure to covid-19: Secondary | ICD-10-CM | POA: Insufficient documentation

## 2020-02-23 DIAGNOSIS — R911 Solitary pulmonary nodule: Secondary | ICD-10-CM

## 2020-02-23 LAB — SARS CORONAVIRUS 2 (TAT 6-24 HRS): SARS Coronavirus 2: NEGATIVE

## 2020-02-23 IMAGING — CT CT CHEST SUPER D W/O CM
2 of 5 series · 15 of 36 positions shown, 18 images · non-contrast
Comparison: Chest CT from [facility] dated [DATE]

CLINICAL DATA: Right lung nodule.

EXAM:
CT CHEST WITHOUT CONTRAST
TECHNIQUE: Multidetector CT imaging of the chest was performed using thin slice
collimation for electromagnetic bronchoscopy planning purposes,
without intravenous contrast.

[Series 4: thins · axial · 0.72mm/px · z∈[-326,-35]mm · 12 of 422 slices shown, 15 images]
[im 29/422  mediastinal]
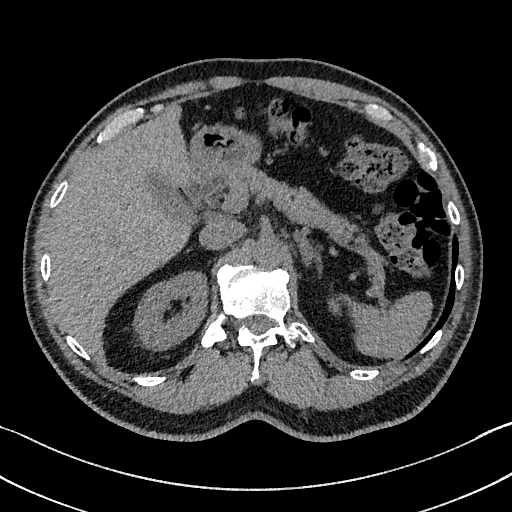
[im 29/422  lung]
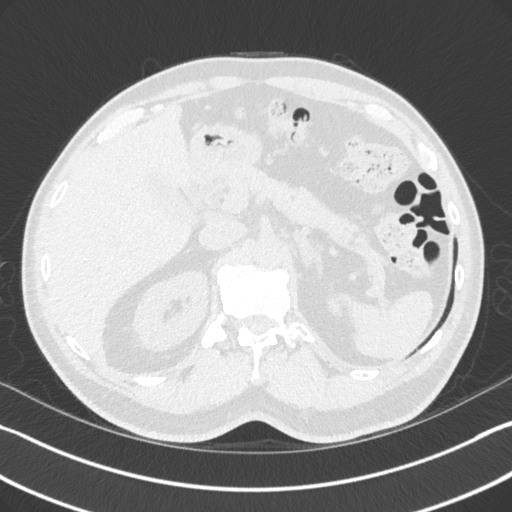
[im 57/422  lung]
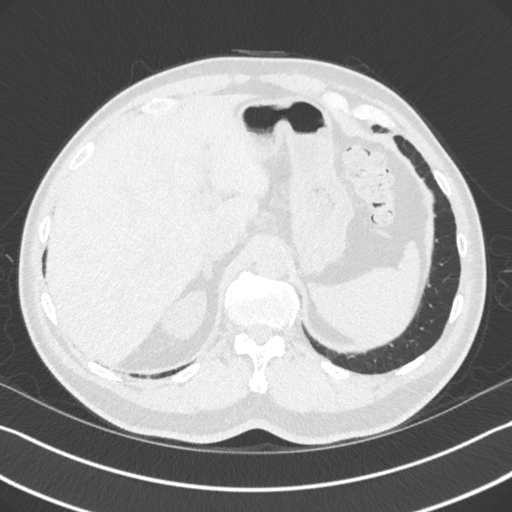
[im 85/422  lung]
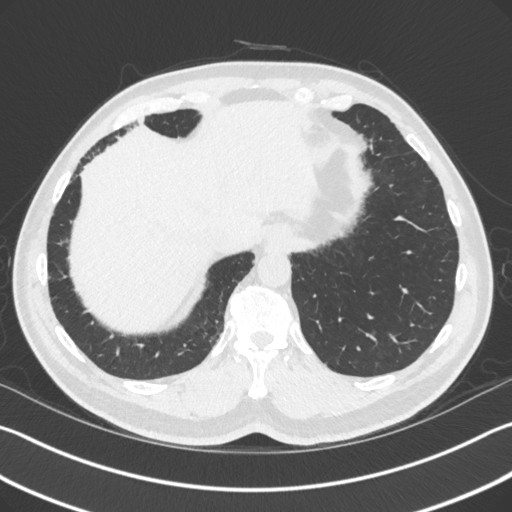
[im 141/422  lung]
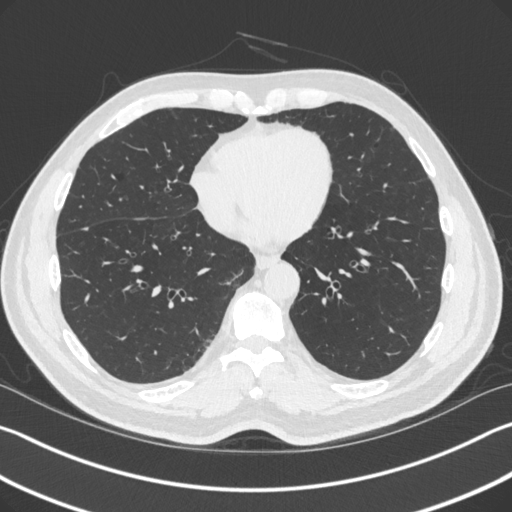
[im 169/422  mediastinal]
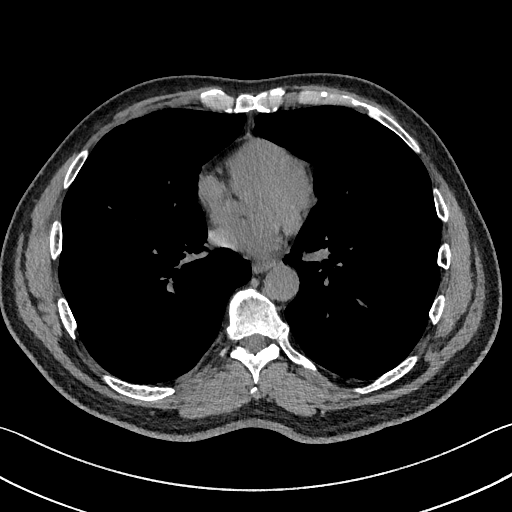
[im 169/422  lung]
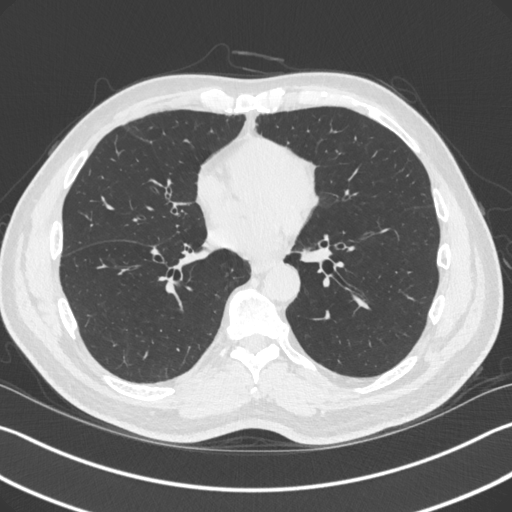
[im 197/422  lung]
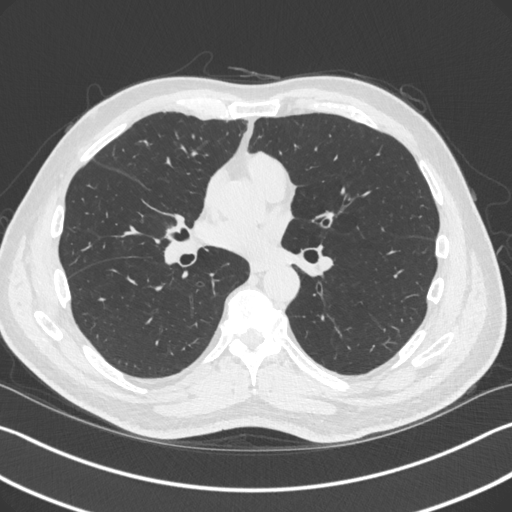
[im 225/422  lung]
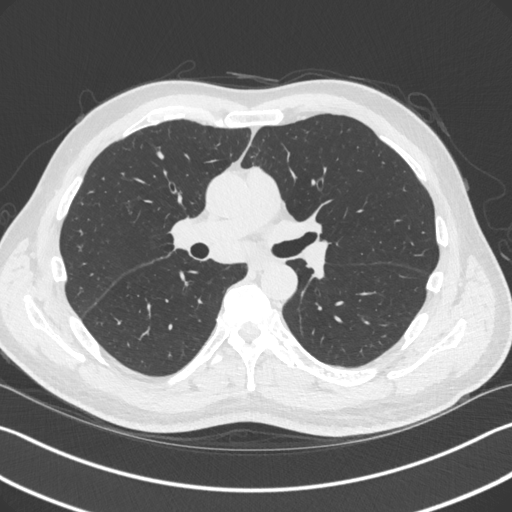
[im 253/422  lung]
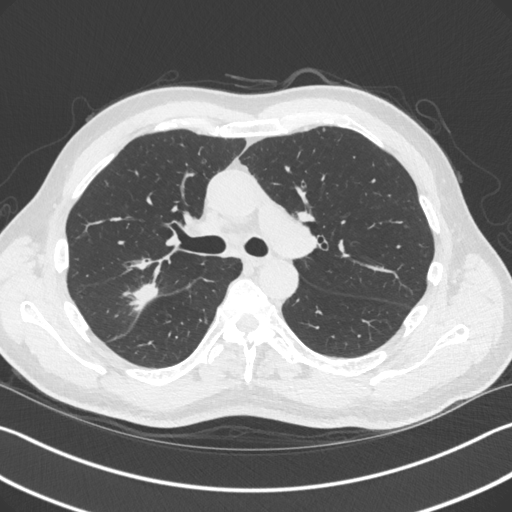
[im 281/422  mediastinal]
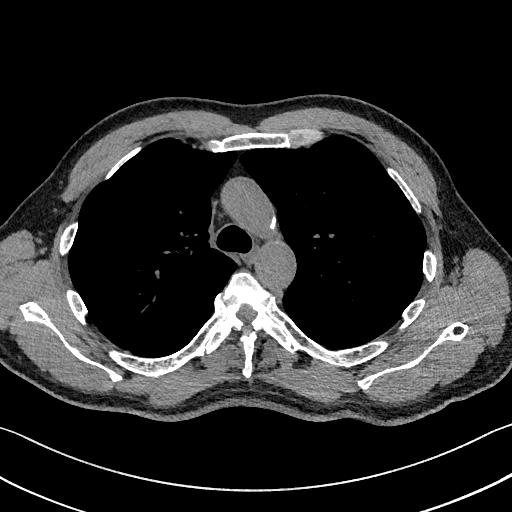
[im 281/422  lung]
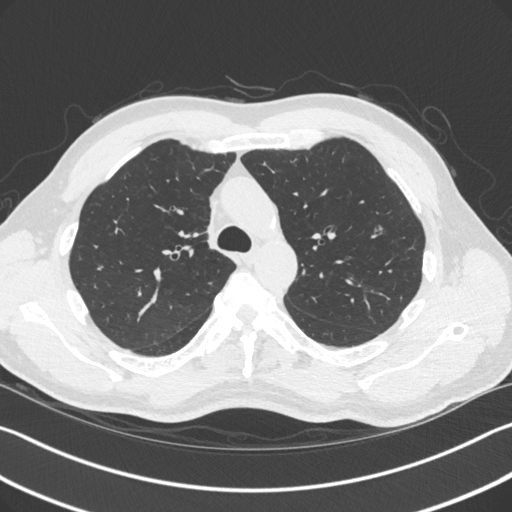
[im 337/422  lung]
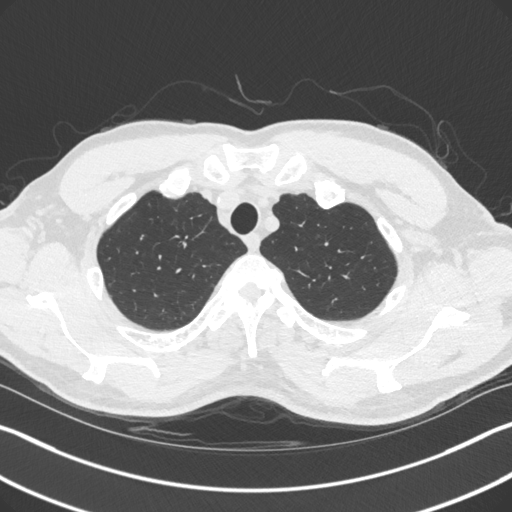
[im 365/422  lung]
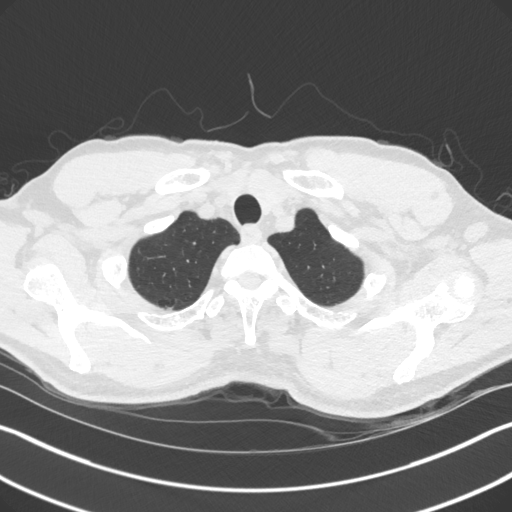
[im 393/422  lung]
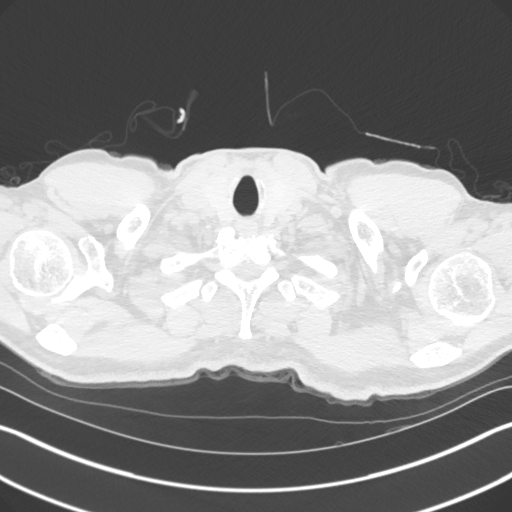

[Series 5: coronal · coronal · 0.66mm/px · 3 of 81 slices shown]
[im 17/81  lung]
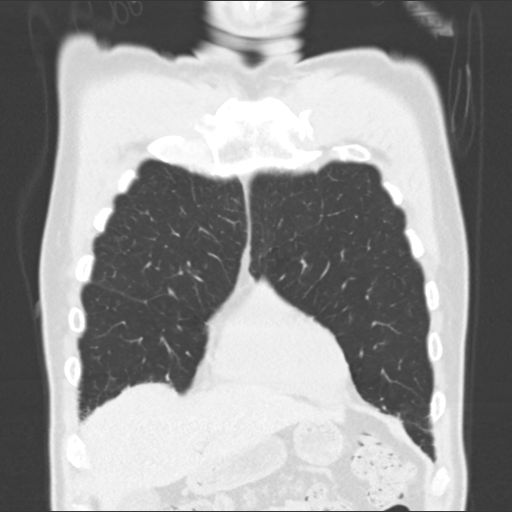
[im 33/81  lung]
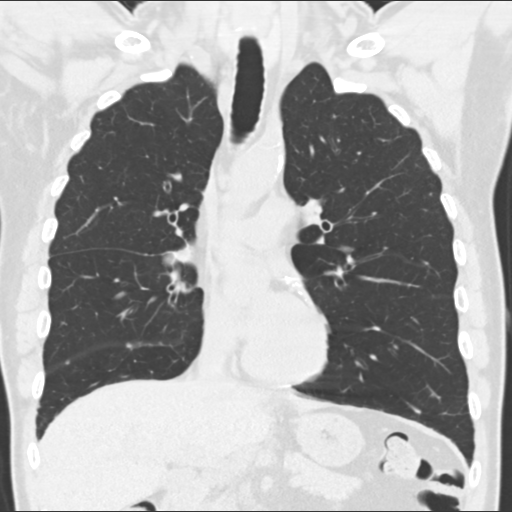
[im 49/81  lung]
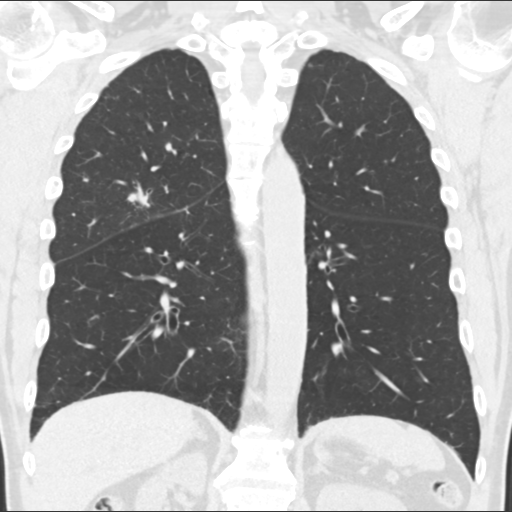

[15 of 36 positions shown; findings below may reference images not displayed]

FINDINGS: Cardiovascular: No acute findings. Aortic and coronary artery
atherosclerosis noted.

Mediastinum/Nodes: Right hilar lymphadenopathy with largest node
measuring 1.8 cm in short axis shows no significant change. Mild
lymphadenopathy in the precarinal region measures 10 mm in short
axis, and mild subcarinal lymphadenopathy measures 16 mm, also
without significant change. No other sites of lymphadenopathy are
identified. A 2.1 cm heterogeneous low-attenuation left thyroid lobe
nodule is also noted.

Lungs/Pleura: Spiculated nodule in posterior right upper lobe shows
slight increase in size, currently measuring 2.2 x 2.0 cm on image
66/3, compared to 2.1 x 1.7 cm previously. This mass abuts the major
fissure. No other suspicious pulmonary nodules identified. No
evidence of pulmonary infiltrate or pleural effusion.

Upper Abdomen: Unremarkable. Both adrenal glands are normal in
appearance.

Musculoskeletal:  No suspicious bone lesions.
IMPRESSION: Slight increase in size of 2.2 cm spiculated nodule in posterior
right upper lobe, consistent with primary bronchogenic carcinoma.
This nodule abuts the major fissure.

Stable mild right hilar and mediastinal lymphadenopathy, consistent
with metastatic disease.

2.1 cm left thyroid lobe nodule. Recommend thyroid US. (ref: [HOSPITAL]. [DATE]): 143-50).

Aortic Atherosclerosis ([9N]-[9N]).

## 2020-02-23 NOTE — Telephone Encounter (Signed)
Patient contacted and informed he would not need to stay overnight in the hospital for the biopsy he has scheduled. Patient anxious and fearful. Procedure and recovery discussed, patient verbalized relief after getting information.

## 2020-02-24 ENCOUNTER — Other Ambulatory Visit: Payer: Self-pay

## 2020-02-24 ENCOUNTER — Encounter (HOSPITAL_COMMUNITY): Payer: Self-pay | Admitting: Emergency Medicine

## 2020-02-24 NOTE — Progress Notes (Signed)
Spoke with pt for pre-op call. Pt denies cardiac history (states he had heart murmur when he was a child). Pt is treated for HTN, denies diabetes.   Covid test done yesterday, it's negative. Pt states he's been in quarantine since the test was done and understands he stays in quarantine until he comes to the hospital.

## 2020-02-25 ENCOUNTER — Other Ambulatory Visit: Payer: Self-pay

## 2020-02-25 ENCOUNTER — Ambulatory Visit (HOSPITAL_COMMUNITY): Payer: No Typology Code available for payment source

## 2020-02-25 ENCOUNTER — Ambulatory Visit (HOSPITAL_COMMUNITY): Payer: No Typology Code available for payment source | Admitting: Certified Registered Nurse Anesthetist

## 2020-02-25 ENCOUNTER — Ambulatory Visit (HOSPITAL_COMMUNITY)
Admission: RE | Admit: 2020-02-25 | Discharge: 2020-02-25 | Disposition: A | Discharge status: Home / Self Care | Payer: No Typology Code available for payment source | Attending: Emergency Medicine | Admitting: Emergency Medicine

## 2020-02-25 ENCOUNTER — Other Ambulatory Visit (HOSPITAL_COMMUNITY)
Admission: RE | Admit: 2020-02-25 | Discharge: 2020-02-25 | Disposition: A | Discharge status: Home / Self Care | Payer: No Typology Code available for payment source | Source: Ambulatory Visit | Attending: Emergency Medicine | Admitting: Emergency Medicine

## 2020-02-25 ENCOUNTER — Encounter (HOSPITAL_COMMUNITY)
Admission: RE | Disposition: A | Discharge status: Home / Self Care | Payer: Self-pay | Source: Home / Self Care | Attending: Emergency Medicine

## 2020-02-25 ENCOUNTER — Encounter (HOSPITAL_COMMUNITY): Payer: Self-pay | Admitting: Emergency Medicine

## 2020-02-25 DIAGNOSIS — R911 Solitary pulmonary nodule: Secondary | ICD-10-CM | POA: Insufficient documentation

## 2020-02-25 DIAGNOSIS — Z8673 Personal history of transient ischemic attack (TIA), and cerebral infarction without residual deficits: Secondary | ICD-10-CM | POA: Diagnosis not present

## 2020-02-25 DIAGNOSIS — C969 Malignant neoplasm of lymphoid, hematopoietic and related tissue, unspecified: Secondary | ICD-10-CM | POA: Diagnosis not present

## 2020-02-25 DIAGNOSIS — Z79899 Other long term (current) drug therapy: Secondary | ICD-10-CM | POA: Diagnosis not present

## 2020-02-25 DIAGNOSIS — G8929 Other chronic pain: Secondary | ICD-10-CM | POA: Insufficient documentation

## 2020-02-25 DIAGNOSIS — C3411 Malignant neoplasm of upper lobe, right bronchus or lung: Secondary | ICD-10-CM | POA: Diagnosis present

## 2020-02-25 DIAGNOSIS — F17210 Nicotine dependence, cigarettes, uncomplicated: Secondary | ICD-10-CM | POA: Diagnosis not present

## 2020-02-25 DIAGNOSIS — Z9889 Other specified postprocedural states: Secondary | ICD-10-CM

## 2020-02-25 DIAGNOSIS — R590 Localized enlarged lymph nodes: Secondary | ICD-10-CM

## 2020-02-25 DIAGNOSIS — I10 Essential (primary) hypertension: Secondary | ICD-10-CM | POA: Insufficient documentation

## 2020-02-25 DIAGNOSIS — J45909 Unspecified asthma, uncomplicated: Secondary | ICD-10-CM | POA: Insufficient documentation

## 2020-02-25 DIAGNOSIS — Z419 Encounter for procedure for purposes other than remedying health state, unspecified: Secondary | ICD-10-CM

## 2020-02-25 HISTORY — PX: VIDEO BRONCHOSCOPY WITH ENDOBRONCHIAL ULTRASOUND: SHX6177

## 2020-02-25 HISTORY — DX: Cerebral infarction, unspecified: I63.9

## 2020-02-25 HISTORY — DX: Unspecified asthma, uncomplicated: J45.909

## 2020-02-25 HISTORY — PX: VIDEO BRONCHOSCOPY WITH ENDOBRONCHIAL NAVIGATION: SHX6175

## 2020-02-25 IMAGING — DX DG CHEST 1V PORT
1 series · 1 of 1 positions shown · non-contrast
Comparison: Chest CT dated [DATE].

CLINICAL DATA: 64-year-old male status post bronchoscopy

EXAM:
PORTABLE CHEST 1 VIEW

[chest ap]
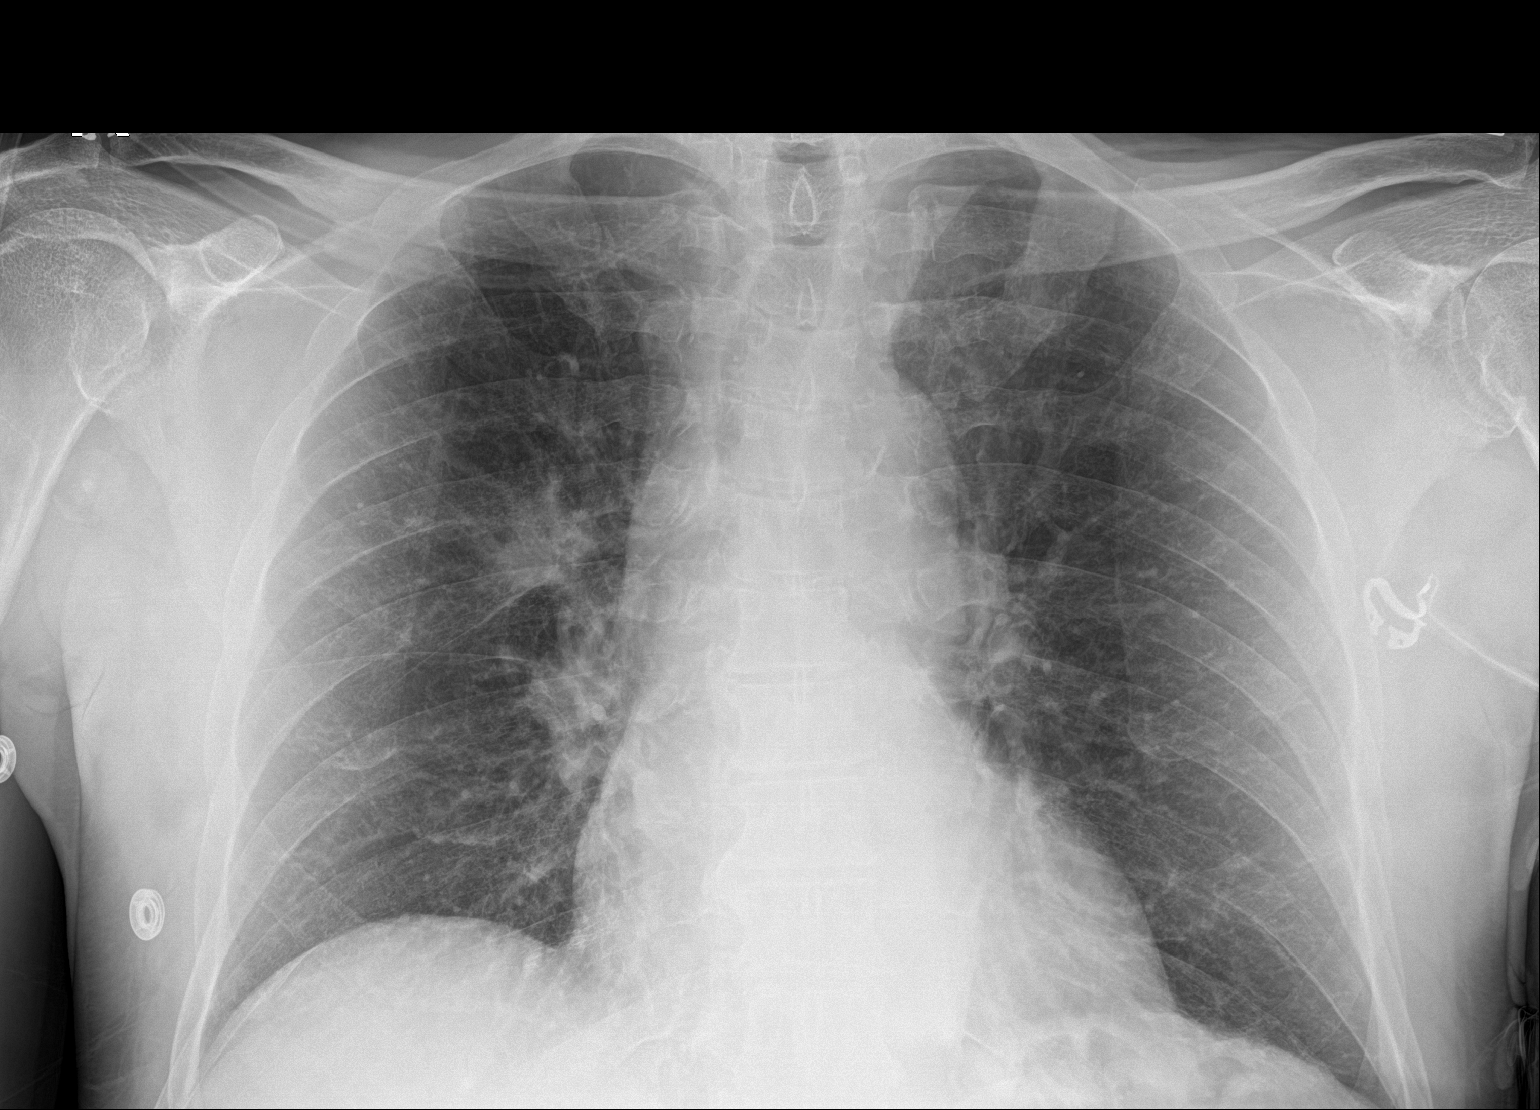

[1 of 1 positions shown; findings below may reference images not displayed]

FINDINGS: Right upper lobe nodule as seen on the prior CT. No new
consolidation. There is no pleural effusion or pneumothorax. The
cardiac silhouette is within limits. No acute osseous pathology.
Atherosclerotic calcification of the aorta.
IMPRESSION: No acute cardiopulmonary process. No pneumothorax status post
bronchoscopy of the right upper lobe nodule.

## 2020-02-25 SURGERY — BRONCHOSCOPY, WITH EBUS
Anesthesia: General | Site: Chest

## 2020-02-25 MED ORDER — MIDAZOLAM HCL 2 MG/2ML IJ SOLN
INTRAMUSCULAR | Status: AC
  Filled 2020-02-25: qty 2

## 2020-02-25 MED ORDER — CHLORHEXIDINE GLUCONATE 0.12 % MT SOLN
OROMUCOSAL | Status: AC
  Administered 2020-02-25: 15 mL
  Filled 2020-02-25: qty 15

## 2020-02-25 MED ORDER — ONDANSETRON HCL 4 MG/2ML IJ SOLN
INTRAMUSCULAR | Status: DC | PRN
  Administered 2020-02-25: 4 mg via INTRAVENOUS

## 2020-02-25 MED ORDER — PHENYLEPHRINE HCL-NACL 10-0.9 MG/250ML-% IV SOLN
INTRAVENOUS | Status: DC | PRN
  Administered 2020-02-25: 10 ug/min via INTRAVENOUS

## 2020-02-25 MED ORDER — MIDAZOLAM HCL 5 MG/5ML IJ SOLN
INTRAMUSCULAR | Status: DC | PRN
  Administered 2020-02-25: 2 mg via INTRAVENOUS

## 2020-02-25 MED ORDER — FENTANYL CITRATE (PF) 100 MCG/2ML IJ SOLN
INTRAMUSCULAR | Status: DC | PRN
  Administered 2020-02-25 (×3): 50 ug via INTRAVENOUS

## 2020-02-25 MED ORDER — SUGAMMADEX SODIUM 200 MG/2ML IV SOLN
INTRAVENOUS | Status: DC | PRN
  Administered 2020-02-25: 200 mg via INTRAVENOUS

## 2020-02-25 MED ORDER — ROCURONIUM BROMIDE 10 MG/ML (PF) SYRINGE
PREFILLED_SYRINGE | INTRAVENOUS | Status: DC | PRN
  Administered 2020-02-25: 80 mg via INTRAVENOUS

## 2020-02-25 MED ORDER — MEPERIDINE HCL 25 MG/ML IJ SOLN: 6.2500 mg | INTRAMUSCULAR | Status: DC | PRN

## 2020-02-25 MED ORDER — LIDOCAINE 20MG/ML (2%) 15 ML SYRINGE OPTIME
INTRAMUSCULAR | Status: DC | PRN
  Administered 2020-02-25: 60 mg via INTRAVENOUS

## 2020-02-25 MED ORDER — LACTATED RINGERS IV SOLN: INTRAVENOUS | Status: DC

## 2020-02-25 MED ORDER — PROMETHAZINE HCL 25 MG/ML IJ SOLN: 6.2500 mg | INTRAMUSCULAR | Status: DC | PRN

## 2020-02-25 MED ORDER — PROPOFOL 10 MG/ML IV BOLUS
INTRAVENOUS | Status: DC | PRN
  Administered 2020-02-25: 120 mg via INTRAVENOUS

## 2020-02-25 MED ORDER — DEXAMETHASONE SODIUM PHOSPHATE 10 MG/ML IJ SOLN
INTRAMUSCULAR | Status: DC | PRN
  Administered 2020-02-25: 4 mg via INTRAVENOUS

## 2020-02-25 MED ORDER — FENTANYL CITRATE (PF) 250 MCG/5ML IJ SOLN
INTRAMUSCULAR | Status: AC
  Filled 2020-02-25: qty 5

## 2020-02-25 MED ORDER — FENTANYL CITRATE (PF) 100 MCG/2ML IJ SOLN: 25.0000 ug | INTRAMUSCULAR | Status: DC | PRN

## 2020-02-25 SURGICAL SUPPLY — 46 items
ADAPTER BRONCHOSCOPE OLYMP 190 (ADAPTER) ×2 IMPLANT
ADAPTER BRONCHOSCOPE OLYMPUS (ADAPTER) ×2 IMPLANT
ADAPTER VALVE BIOPSY EBUS (MISCELLANEOUS) IMPLANT
ADPTR VALVE BIOPSY EBUS (MISCELLANEOUS)
BRUSH CYTOL CELLEBRITY 1.5X140 (MISCELLANEOUS) ×2 IMPLANT
BRUSH SUPERTRAX BIOPSY (INSTRUMENTS) IMPLANT
BRUSH SUPERTRAX NDL-TIP CYTO (INSTRUMENTS) ×2 IMPLANT
CANISTER SUCT 3000ML PPV (MISCELLANEOUS) ×2 IMPLANT
CNTNR URN SCR LID CUP LEK RST (MISCELLANEOUS) ×1 IMPLANT
CONT SPEC 4OZ STRL OR WHT (MISCELLANEOUS) ×1
COVER BACK TABLE 60X90IN (DRAPES) ×2 IMPLANT
COVER WAND RF STERILE (DRAPES) ×2 IMPLANT
FILTER STRAW FLUID ASPIR (MISCELLANEOUS) IMPLANT
FORCEPS BIOP 1.5 SINGLE USE (MISCELLANEOUS) ×2 IMPLANT
FORCEPS BIOP RJ4 1.8 (CUTTING FORCEPS) IMPLANT
FORCEPS BIOP SUPERTRX PREMAR (INSTRUMENTS) ×2 IMPLANT
GAUZE SPONGE 4X4 12PLY STRL (GAUZE/BANDAGES/DRESSINGS) ×2 IMPLANT
GLOVE BIO SURGEON STRL SZ7.5 (GLOVE) ×4 IMPLANT
GOWN STRL REUS W/ TWL LRG LVL3 (GOWN DISPOSABLE) ×2 IMPLANT
GOWN STRL REUS W/TWL LRG LVL3 (GOWN DISPOSABLE) ×2
KIT CLEAN ENDO COMPLIANCE (KITS) ×4 IMPLANT
KIT ILLUMISITE 180 PROCEDURE (KITS) ×2 IMPLANT
KIT ILLUMISITE 90 PROCEDURE (KITS) IMPLANT
KIT LOCATABLE GUIDE (CANNULA) IMPLANT
KIT MARKER FIDUCIAL DELIVERY (KITS) IMPLANT
KIT TURNOVER KIT B (KITS) ×2 IMPLANT
MARKER SKIN DUAL TIP RULER LAB (MISCELLANEOUS) ×2 IMPLANT
NEEDLE ASPIRATION VIZISHOT 19G (NEEDLE) ×2 IMPLANT
NEEDLE ASPIRATION VIZISHOT 21G (NEEDLE) IMPLANT
NEEDLE SUPERTRX PREMARK BIOPSY (NEEDLE) ×2 IMPLANT
NS IRRIG 1000ML POUR BTL (IV SOLUTION) ×2 IMPLANT
OIL SILICONE PENTAX (PARTS (SERVICE/REPAIRS)) ×2 IMPLANT
PAD ARMBOARD 7.5X6 YLW CONV (MISCELLANEOUS) ×4 IMPLANT
PATCHES PATIENT (LABEL) ×6 IMPLANT
SYR 20ML ECCENTRIC (SYRINGE) ×4 IMPLANT
SYR 20ML LL LF (SYRINGE) ×6 IMPLANT
SYR 50ML SLIP (SYRINGE) ×2 IMPLANT
SYR 5ML LUER SLIP (SYRINGE) ×2 IMPLANT
TOWEL GREEN STERILE FF (TOWEL DISPOSABLE) ×2 IMPLANT
TRAP SPECIMEN MUCUS 40CC (MISCELLANEOUS) IMPLANT
TUBE CONNECTING 20X1/4 (TUBING) ×4 IMPLANT
UNDERPAD 30X36 HEAVY ABSORB (UNDERPADS AND DIAPERS) ×2 IMPLANT
VALVE BIOPSY  SINGLE USE (MISCELLANEOUS) ×1
VALVE BIOPSY SINGLE USE (MISCELLANEOUS) ×1 IMPLANT
VALVE SUCTION BRONCHIO DISP (MISCELLANEOUS) ×4 IMPLANT
WATER STERILE IRR 1000ML POUR (IV SOLUTION) ×2 IMPLANT

## 2020-02-25 NOTE — Anesthesia Preprocedure Evaluation (Addendum)
Anesthesia Evaluation  Patient identified by MRN, date of birth, ID band Patient awake    Reviewed: Allergy & Precautions, H&P , NPO status , Patient's Chart, lab work & pertinent test results  History of Anesthesia Complications Negative for: history of anesthetic complications  Airway Mallampati: I  TM Distance: >3 FB Neck ROM: Full    Dental  (+) Teeth Intact, Dental Advisory Given   Pulmonary asthma , Current Smoker and Patient abstained from smoking.,    breath sounds clear to auscultation       Cardiovascular hypertension, Pt. on medications  Rhythm:Regular Rate:Normal     Neuro/Psych TIA   GI/Hepatic Neg liver ROS,   Endo/Other  negative endocrine ROS  Renal/GU negative Renal ROS     Musculoskeletal   Abdominal   Peds  Hematology negative hematology ROS (+)   Anesthesia Other Findings   Reproductive/Obstetrics                           Anesthesia Physical Anesthesia Plan  ASA: III  Anesthesia Plan: General   Post-op Pain Management:    Induction: Intravenous  PONV Risk Score and Plan: 1 and Ondansetron, Dexamethasone and Treatment may vary due to age or medical condition  Airway Management Planned: Oral ETT  Additional Equipment: None  Intra-op Plan:   Post-operative Plan: Extubation in OR  Informed Consent: I have reviewed the patients History and Physical, chart, labs and discussed the procedure including the risks, benefits and alternatives for the proposed anesthesia with the patient or authorized representative who has indicated his/her understanding and acceptance.     Dental advisory given  Plan Discussed with: CRNA  Anesthesia Plan Comments:        Anesthesia Quick Evaluation

## 2020-02-25 NOTE — Op Note (Signed)
Video Bronchoscopy with Electromagnetic Navigation and Endobronchial Ultrasound Procedure Note  Date of Operation: 02/25/2020  Pre-op Diagnosis: Right upper lobe nodule, mediastinal adenopathy  Post-op Diagnosis: Same  Surgeon: Baltazar Apo  Assistants: None  Anesthesia: General endotracheal anesthesia  Operation: Flexible video fiberoptic bronchoscopy with electromagnetic navigation and biopsies.  Estimated Blood Loss: Minimal  Complications: None apparent  Indications and History: Randy Andersen is a 64 y.o. male with history of tobacco use. He was found to have a hypermetabolic right upper lobe pulmonary nodule as well as hypermetabolic mediastinal lymphadenopathy on PET scan done on 01/30/2020. Recommendation was made to achieve tissue diagnosis via navigational bronchoscopy endobronchial ultrasound.  The risks, benefits, complications, treatment options and expected outcomes were discussed with the patient.  The possibilities of pneumothorax, pneumonia, reaction to medication, pulmonary aspiration, perforation of a viscus, bleeding, failure to diagnose a condition and creating a complication requiring transfusion or operation were discussed with the patient who freely signed the consent.    Description of Procedure: The patient was seen in the Preoperative Area, was examined and was deemed appropriate to proceed.  The patient was taken to the OR 11, identified as Randy Andersen and the procedure verified as Flexible Video Fiberoptic Bronchoscopy.  A Time Out was held and the above information confirmed.   Prior to the date of the procedure a high-resolution CT scan of the chest was performed. Utilizing Young Harris a virtual tracheobronchial tree was generated to allow the creation of distinct navigation pathways to the patient's parenchymal abnormalities. After being taken to the operating room general anesthesia was initiated and the patient  was orally intubated. The video  fiberoptic bronchoscope was introduced via the endotracheal tube and a general inspection was performed which showed normal airways throughout. No endobronchial lesions or abnormal secretions were seen. The extendable working channel and locator guide were introduced into the bronchoscope. The distinct navigation pathway prepared prior to this procedure were then utilized to navigate to within 0.3 cm of right upper lobe nodule identified on CT scan. The extendable working channel was secured into place and the locator guide was withdrawn. Under fluoroscopic guidance transbronchial needle brushings, transbronchial Wang needle biopsies, and transbronchial forceps biopsies were performed to be sent for cytology and pathology.   The standard scope was then withdrawn and the endobronchial ultrasound was used to identify and characterize the peritracheal, hilar and bronchial lymph nodes. Inspection showed enlargement at station 4R, significant enlargement at station 7, enlargement at station 12 R. Using real-time ultrasound guidance Wang needle biopsies were take from [place], 7, 12 R nodes and were sent for cytology.   At the end of the procedure a general airway inspection was performed and there was no evidence of active bleeding. The bronchoscope was removed.  The patient tolerated the procedure well. There was no significant blood loss and there were no obvious complications. A post-procedural chest x-ray is pending.  Samples: 1. Transbronchial needle brushings from right upper lobe nodule 2. Transbronchial Wang needle biopsies from right upper lobe nodule 3. Transbronchial forceps biopsies from right upper lobe nodule 4. Wang needle biopsies from 7 node 5. Wang needle biopsies from 4R node 6. Wang needle biopsies from 12 R node  Plans:  The patient will be discharged from the PACU to home when recovered from anesthesia and after chest x-ray is reviewed. We will review the cytology, pathology and  microbiology results with the patient when they become available. Outpatient followup will be with Dr Lamonte Sakai.  Baltazar Apo, MD, PhD 02/25/2020, 3:17 PM [facility] [phone] or if no answer [phone]

## 2020-02-25 NOTE — Transfer of Care (Signed)
Immediate Anesthesia Transfer of Care Note  Patient: Randy Andersen  Procedure(s) Performed: VIDEO BRONCHOSCOPY WITH ENDOBRONCHIAL ULTRASOUND (N/A Chest) VIDEO BRONCHOSCOPY WITH ENDOBRONCHIAL NAVIGATION with biopsies (N/A Chest)  Patient Location: PACU  Anesthesia Type:General  Level of Consciousness: awake, alert  and oriented  Airway & Oxygen Therapy: Patient Spontanous Breathing and Patient connected to face mask oxygen  Post-op Assessment: Report given to RN and Post -op Vital signs reviewed and stable  Post vital signs: Reviewed and stable  Last Vitals:  Vitals Value Taken Time  BP 139/81 02/25/20 1524  Temp    Pulse 66 02/25/20 1526  Resp 13 02/25/20 1526  SpO2 100 % 02/25/20 1526  Vitals shown include unvalidated device data.  Last Pain:  Vitals:   02/25/20 1130  TempSrc: Oral      Patients Stated Pain Goal: 3 (37/10/62 6948)  Complications: No complications documented.

## 2020-02-25 NOTE — Discharge Instructions (Signed)
Flexible Bronchoscopy, Care After This sheet gives you information about how to care for yourself after your test. Your doctor may also give you more specific instructions. If you have problems or questions, contact your doctor. Follow these instructions at home: Eating and drinking  Do not eat or drink anything (not even water) for 2 hours after your test, or until your numbing medicine (local anesthetic) wears off.  When your numbness is gone and your cough and gag reflexes have come back, you may: ? Eat only soft foods. ? Slowly drink liquids.  The day after the test, go back to your normal diet. Driving  Do not drive for 24 hours if you were given a medicine to help you relax (sedative).  Do not drive or use heavy machinery while taking prescription pain medicine. General instructions   Take over-the-counter and prescription medicines only as told by your doctor.  Return to your normal activities as told. Ask what activities are safe for you.  Do not use any products that have nicotine or tobacco in them. This includes cigarettes and e-cigarettes. If you need help quitting, ask your doctor.  Keep all follow-up visits as told by your doctor. This is important. It is very important if you had a tissue sample (biopsy) taken. Get help right away if:  You have shortness of breath that gets worse.  You get light-headed.  You feel like you are going to pass out (faint).  You have chest pain.  You cough up: ? More than a little blood. ? More blood than before. Summary  Do not eat or drink anything (not even water) for 2 hours after your test, or until your numbing medicine wears off.  Do not use cigarettes. Do not use e-cigarettes.  Get help right away if you have chest pain.   Please call our office for any questions or concerns. [phone].  This information is not intended to replace advice given to you by your health care provider. Make sure you discuss any  questions you have with your health care provider. Document Revised: 06/22/2017 Document Reviewed: 07/28/2016 Elsevier Patient Education  2020 [employer].

## 2020-02-25 NOTE — Anesthesia Procedure Notes (Signed)
Procedure Name: Intubation Date/Time: 02/25/2020 1:15 PM Performed by: Lowella Dell, CRNA Pre-anesthesia Checklist: Patient identified, Emergency Drugs available, Suction available and Patient being monitored Patient Re-evaluated:Patient Re-evaluated prior to induction Oxygen Delivery Method: Circle System Utilized Preoxygenation: Pre-oxygenation with 100% oxygen Induction Type: IV induction Ventilation: Mask ventilation without difficulty Laryngoscope Size: Mac and 4 Grade View: Grade I Tube type: Oral Tube size: 8.5 mm Number of attempts: 1 Airway Equipment and Method: Stylet Placement Confirmation: ETT inserted through vocal cords under direct vision,  positive ETCO2 and breath sounds checked- equal and bilateral Secured at: 22 cm Tube secured with: Tape Dental Injury: Teeth and Oropharynx as per pre-operative assessment

## 2020-02-25 NOTE — Interval H&P Note (Signed)
History and Physical Interval Note:  02/25/2020 12:39 PM  Randy Andersen  has presented today for surgery, with the diagnosis of PULMONARY NODULE.  The various methods of treatment have been discussed with the patient and family. After consideration of risks, benefits and other options for treatment, the patient has consented to  Procedure(s): VIDEO BRONCHOSCOPY WITH ENDOBRONCHIAL ULTRASOUND (N/A) VIDEO BRONCHOSCOPY WITH ENDOBRONCHIAL NAVIGATION (N/A) as a surgical intervention.  The patient's history has been reviewed, patient examined, no change in status, stable for surgery.  I have reviewed the patient's chart and labs.  Questions were answered to the patient's satisfaction.     Collene Gobble

## 2020-02-26 ENCOUNTER — Encounter (HOSPITAL_COMMUNITY): Payer: Self-pay | Admitting: Emergency Medicine

## 2020-02-26 NOTE — Anesthesia Postprocedure Evaluation (Signed)
Anesthesia Post Note  Patient: Randy Andersen  Procedure(s) Performed: VIDEO BRONCHOSCOPY WITH ENDOBRONCHIAL ULTRASOUND (N/A Chest) VIDEO BRONCHOSCOPY WITH ENDOBRONCHIAL NAVIGATION with biopsies (N/A Chest)     Patient location during evaluation: PACU Anesthesia Type: General Level of consciousness: awake and alert Pain management: pain level controlled Vital Signs Assessment: post-procedure vital signs reviewed and stable Respiratory status: spontaneous breathing, nonlabored ventilation, respiratory function stable and patient connected to nasal cannula oxygen Cardiovascular status: blood pressure returned to baseline and stable Postop Assessment: no apparent nausea or vomiting Anesthetic complications: no   No complications documented.  Last Vitals:  Vitals:   02/25/20 1615 02/25/20 1630  BP: 134/87 134/82  Pulse: 65 61  Resp: 13 10  Temp:  36.5 C  SpO2: 100% 100%    Last Pain:  Vitals:   02/25/20 1615  TempSrc:   PainSc: 0-No pain                 Cutter Passey L Jovonda Selner

## 2020-02-27 ENCOUNTER — Telehealth: Payer: Self-pay | Admitting: Emergency Medicine

## 2020-02-27 DIAGNOSIS — C3491 Malignant neoplasm of unspecified part of right bronchus or lung: Secondary | ICD-10-CM

## 2020-02-27 LAB — CYTOLOGY - NON PAP

## 2020-02-27 LAB — SURGICAL PATHOLOGY

## 2020-02-27 NOTE — Telephone Encounter (Signed)
Spoke with the patient.  Reviewed his biopsy results.  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  He has appointment set up at the [facility] for next week please not sure if these are with oncology.  He is going to go to these on the ninth on the 12th.  I gave him the pathology information to pass along at those visits.  He states that he may want to seek care at the [facility].  I will make an [place] referral.  If he gets established at the [facility] [place] and they initiate therapy that he can always cancel the [place] appt

## 2020-03-01 ENCOUNTER — Telehealth: Payer: Self-pay | Admitting: Emergency Medicine

## 2020-03-01 NOTE — Telephone Encounter (Signed)
Randy Andersen phone number is [phone].

## 2020-03-01 NOTE — Telephone Encounter (Signed)
Spoke with Ledell Noss and she states already received records from [place]  Nothing further needed

## 2020-03-02 ENCOUNTER — Other Ambulatory Visit: Payer: Self-pay | Admitting: Radiation Therapy

## 2020-03-02 ENCOUNTER — Institutional Professional Consult (permissible substitution): Payer: Non-veteran care | Admitting: Emergency Medicine

## 2020-03-02 DIAGNOSIS — C7931 Secondary malignant neoplasm of brain: Secondary | ICD-10-CM

## 2020-03-04 ENCOUNTER — Other Ambulatory Visit: Payer: Self-pay | Admitting: *Deleted

## 2020-03-04 NOTE — Progress Notes (Signed)
The proposed treatment discussed in cancer conference 03/04/20 is for discussion purpose only and is not a binding recommendation.  The patient was not physically examined nor present for their treatment options.  Therefore, final treatment plans cannot be decided.

## 2020-03-07 ENCOUNTER — Ambulatory Visit (HOSPITAL_COMMUNITY): Admission: RE | Admit: 2020-03-07 | Payer: No Typology Code available for payment source | Source: Ambulatory Visit

## 2020-03-08 ENCOUNTER — Telehealth: Payer: Self-pay | Admitting: Radiation Oncology

## 2020-03-08 ENCOUNTER — Telehealth: Payer: Self-pay | Admitting: Physician Assistant

## 2020-03-08 ENCOUNTER — Encounter: Payer: Self-pay | Admitting: *Deleted

## 2020-03-08 DIAGNOSIS — R590 Localized enlarged lymph nodes: Secondary | ICD-10-CM

## 2020-03-08 NOTE — Progress Notes (Addendum)
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  MRI Brain 03/13/2020:  EBUS 02/25/2020: non small cell lung cancer favoring adenocarcinoma could not send for additional markers such as EGFR due to inadequate sample from EBUS.  MRI Brain: 0.3 cm enhancing focus in posterior left frontal lobe suspicious for metastasis.   PET: hypermetabolic activity in the posterior right upper lobe nodule consistent with primary bronchogenic carcinoma, hypermetabolic right hilar/infrahilar, subcarinal, and right paratracheal lymph nodes are noted and consistent with malignant involvement.  Multiple small nodules along the right major and minor fissures which are new and below size resolution for the PET  Portion of this examination but remain suspicious for pleural metastatic disease.  Mildly enlarged hypermetabolic pericaval lymph node consistent with metastatic disease.  CT Chest 01/01/2020: 20 mm spiculated nodule in posterior right upper lobe, 3 mm right middle nodule, mediastinal lymphadenopathy ( 1cm right paratracheal nodule, right hilar lymphadenopathy 1.9 cm).  Biopsy of RUL Lung/ Lymph Nodes 02/25/2020     Past or anticipated interventions, if any, per neurosurgery:   Past or anticipated interventions, if any, per medical oncology:  PA Heilingoetter 03/16/2020 1:30 pm   Dr. Geralynn Ochs Dr. Neita Carp 03/04/2020 - Would recommend pembrolizumab/carbo/pem however pt cannot drive to [facility] and will refer to [facility] med[facility]c in [place].  Dose of Decadron, if applicable: No  Recent neurologic symptoms, if any:   Seizures: No  Headaches: No  Nausea: No  Dizziness/ataxia: No  Difficulty with hand coordination: no  Focal numbness/weakness: Numbness in fingers baseline from previous TIA.  Visual deficits/changes: no  Confusion/Memory deficits: Has some memory  loss  Signs/Symptoms  Weight changes, if any: Lost about 5-6 pounds  Respiratory complaints, if any: No SOB noted  Hemoptysis, if any: non-productive cough, no hemoptysis since biopsy.  Pain issues, if any:  No   SAFETY ISSUES:  Prior radiation? no  Pacemaker/ICD? no  Possible current pregnancy? n/a  Is the patient on methotrexate? no  Additional Complaints / other details:  - Liver x ray- unscheduled at this time.

## 2020-03-08 NOTE — Telephone Encounter (Signed)
LVM for Randy Andersen at the [place] to update her on Randy Andersen appts. Advised her to call radiation back.

## 2020-03-08 NOTE — Telephone Encounter (Signed)
Received a new pt referral from Dr. Lamonte Sakai for adenocarcinoma of the lung. Randy Andersen has been cld and scheduled to see [name] on 8/24 at 130pm w/labs at 1pm. Pt aware to arrive 15 minutes early.

## 2020-03-08 NOTE — Progress Notes (Signed)
I received a message that Mr. Hinch referral has been authed by the [place].  I notified new patient coordinator to call and schedule him next week with Cassi.

## 2020-03-09 ENCOUNTER — Ambulatory Visit
Admission: RE | Admit: 2020-03-09 | Discharge: 2020-03-09 | Disposition: A | Discharge status: Home / Self Care | Payer: Non-veteran care | Source: Ambulatory Visit | Attending: Radiation Oncology | Admitting: Radiation Oncology

## 2020-03-09 ENCOUNTER — Other Ambulatory Visit: Payer: Self-pay

## 2020-03-09 ENCOUNTER — Other Ambulatory Visit: Payer: Self-pay | Admitting: Radiation Therapy

## 2020-03-09 ENCOUNTER — Encounter: Payer: Self-pay | Admitting: Radiation Oncology

## 2020-03-09 VITALS — Ht 73.0 in | Wt 179.0 lb

## 2020-03-09 DIAGNOSIS — C3411 Malignant neoplasm of upper lobe, right bronchus or lung: Secondary | ICD-10-CM

## 2020-03-09 DIAGNOSIS — C7931 Secondary malignant neoplasm of brain: Secondary | ICD-10-CM

## 2020-03-09 DIAGNOSIS — C7949 Secondary malignant neoplasm of other parts of nervous system: Secondary | ICD-10-CM

## 2020-03-09 HISTORY — DX: Malignant neoplasm of upper lobe, right bronchus or lung: C34.11

## 2020-03-09 NOTE — Progress Notes (Signed)
Radiation Oncology         ([phone] ________________________________  Initial Outpatient Consultation - Conducted via telephone due to current COVID-19 concerns for limiting patient exposure  I spoke with the patient to conduct this consult visit via telephone to spare the patient unnecessary potential exposure in the healthcare setting during the current COVID-19 pandemic. The patient was notified in advance and was offered a [place] meeting to allow for face to face communication but unfortunately reported that they did not have the appropriate resources/technology to support such a visit and instead preferred to proceed with a telephone consult.     Name: Randy Andersen        MRN: [id number]  Date of Service: 03/09/2020 DOB: Mar 10, 1956  QB:HALPFXT, Ok Edwards, MD  Sheria Lang, MD     REFERRING PHYSICIAN: Sheria Lang, MD   DIAGNOSIS: The encounter diagnosis was Malignant neoplasm of upper lobe of right lung ([place]).   HISTORY OF PRESENT ILLNESS: Randy Andersen is a 64 y.o. male seen at the request of Dr. Lamonte Sakai for newly diagnosed cancer of the right upper lobe.  The patient is a smoker and had a CT scan in June 2021 revealing a 20 mm spiculated nodule in the posterior aspect of the right upper lobe, right middle lobe nodule that was 3 mm, mediastinal and right hilar adenopathy.  PET imaging confirmed hypermetabolism within the right upper lobe nodule, infrahilar, hilar, subcarinal, and right paratracheal nodes as well as small nodules in the right major and minor fissures below PET resolution.  There was concern for a mildly enlarged pericaval node also on imaging.  He did undergo an MRI of the brain on 01/23/2020 which revealed a 3 mm enhancing focus in the posterior left frontal lobe concerning for metastatic disease.  He underwent EBUS with Dr. Lamonte Sakai on 02/25/2020.  His diagnosis was consistent with non-small cell lung cancer, favoring adenocarcinoma.  He is going to be receiving his  follow-up through community care referral to medical oncology here in [place], and is scheduled to meet with [name] and Dr. Julien Nordmann next Tuesday.  His case was discussed in multidisciplinary brain oncology conference, and he may be a good candidate for stereotactic radiosurgery to the brain.  Transportation and scheduling issues have delayed this process but he is scheduled to undergo a 3T MRI of the brain on 03/13/2020.  He is contacted today by telephone to discuss treatment options for his cancer.    PREVIOUS RADIATION THERAPY: No   PAST MEDICAL HISTORY:  Past Medical History:  Diagnosis Date  . Asthma    as a child  . Chronic pain disorder    LT leg and foot  . Heart murmur    as a child  . Hypertension   . Stroke [facility])    mini stroke - 2015, some tingling in fingers on right        PAST SURGICAL HISTORY: Past Surgical History:  Procedure Laterality Date  . BUNIONECTOMY Left    had hammer toe surgery also and callus removed  . BUNIONECTOMY Right    also had hammer toe surgery and callus removed  . LEG SURGERY Left    PT reports he has pins placed in his Lt leg  . ORIF TIBIA PLATEAU  10/09/2012   Dr Lorin Mercy  . ORIF TIBIA PLATEAU Left 10/08/2012   Procedure: OPEN REDUCTION INTERNAL FIXATION (ORIF) TIBIAL PLATEAU;  Surgeon: Marybelle Killings, MD;  Location: [facility];  Service: Orthopedics;  Laterality: Left;  .  VIDEO BRONCHOSCOPY WITH ENDOBRONCHIAL NAVIGATION N/A 02/25/2020   Procedure: VIDEO BRONCHOSCOPY WITH ENDOBRONCHIAL NAVIGATION with biopsies;  Surgeon: Collene Gobble, MD;  Location: [facility];  Service: Thoracic;  Laterality: N/A;  . VIDEO BRONCHOSCOPY WITH ENDOBRONCHIAL ULTRASOUND N/A 02/25/2020   Procedure: VIDEO BRONCHOSCOPY WITH ENDOBRONCHIAL ULTRASOUND;  Surgeon: Collene Gobble, MD;  Location: [facility];  Service: Thoracic;  Laterality: N/A;     FAMILY HISTORY: No family history on file.   SOCIAL HISTORY:  reports that he has been smoking cigarettes. He has a 14.00 pack-year  smoking history. He has never used smokeless tobacco. He reports that he does not drink alcohol and does not use drugs.   ALLERGIES: Patient has no known allergies.   MEDICATIONS:  Current Outpatient Medications  Medication Sig Dispense Refill  . amLODipine (NORVASC) 10 MG tablet Take 10 mg by mouth daily.    Marland Kitchen doxylamine, Sleep, (UNISOM) 25 MG tablet Take 25 mg by mouth at bedtime as needed for sleep.    Marland Kitchen HYDROcodone-acetaminophen (NORCO) 10-325 MG tablet Take 1 tablet by mouth 2 (two) times daily.     No current facility-administered medications for this encounter.     REVIEW OF SYSTEMS: On review of systems, the patient reports that he is doing well overall. He reports shortness of breath that is worsened by activity, but not noticeable at rest. He has a nonproductive cough. He reports he had a few episodes of seeing a small amount of blood the days following his bronchoscopy when he would cough, but that has since resolved. He has lost about 5 pounds unintentionally in the last few weeks. No other complaints are verbalized.    PHYSICAL EXAM:  Wt Readings from Last 3 Encounters:  02/25/20 180 lb (81.6 kg)  02/19/20 179 lb (81.2 kg)  03/27/18 195 lb (88.5 kg)   Unable to assess due to encounter type.  ECOG = 1  0 - Asymptomatic (Fully active, able to carry on all predisease activities without restriction)  1 - Symptomatic but completely ambulatory (Restricted in physically strenuous activity but ambulatory and able to carry out work of a light or sedentary nature. For example, light housework, office work)  2 - Symptomatic, <50% in bed during the day (Ambulatory and capable of all self care but unable to carry out any work activities. Up and about more than 50% of waking hours)  3 - Symptomatic, >50% in bed, but not bedbound (Capable of only limited self-care, confined to bed or chair 50% or more of waking hours)  4 - Bedbound (Completely disabled. Cannot carry on any  self-care. Totally confined to bed or chair)  5 - Death   Eustace Pen MM, Creech RH, Tormey DC, et al. [phone]). "Toxicity and response criteria of the [facility]". [place] Oncol. 5 (6): 649-55    LABORATORY DATA:  Lab Results  Component Value Date   WBC 5.9 02/19/2020   HGB 13.8 02/19/2020   HCT 41.4 02/19/2020   MCV 86.3 02/19/2020   PLT 233.0 02/19/2020   Lab Results  Component Value Date   NA 139 02/19/2020   K 3.5 02/19/2020   CL 106 02/19/2020   CO2 29 02/19/2020   Lab Results  Component Value Date   ALT 17 11/16/2013   AST 18 11/16/2013   ALKPHOS 103 11/16/2013   BILITOT 0.4 11/16/2013      RADIOGRAPHY: DG Chest Port 1 View  Result Date: 02/25/2020 CLINICAL DATA:  64 year old male status post bronchoscopy  EXAM: PORTABLE CHEST 1 VIEW COMPARISON:  Chest CT dated 02/23/2020. FINDINGS: Right upper lobe nodule as seen on the prior CT. No new consolidation. There is no pleural effusion or pneumothorax. The cardiac silhouette is within limits. No acute osseous pathology. Atherosclerotic calcification of the aorta. IMPRESSION: No acute cardiopulmonary process. No pneumothorax status post bronchoscopy of the right upper lobe nodule. Electronically Signed   By: Anner Crete M.D.   On: 02/25/2020 15:56   CT Super D Chest Wo Contrast  Result Date: 02/23/2020 CLINICAL DATA:  Right lung nodule. EXAM: CT CHEST WITHOUT CONTRAST TECHNIQUE: Multidetector CT imaging of the chest was performed using thin slice collimation for electromagnetic bronchoscopy planning purposes, without intravenous contrast. COMPARISON:  Chest CT from [facility] dated 01/01/2020 FINDINGS: Cardiovascular: No acute findings. Aortic and coronary artery atherosclerosis noted. Mediastinum/Nodes: Right hilar lymphadenopathy with largest node measuring 1.8 cm in short axis shows no significant change. Mild lymphadenopathy in the precarinal region measures 10 mm in short axis, and mild subcarinal  lymphadenopathy measures 16 mm, also without significant change. No other sites of lymphadenopathy are identified. A 2.1 cm heterogeneous low-attenuation left thyroid lobe nodule is also noted. Lungs/Pleura: Spiculated nodule in posterior right upper lobe shows slight increase in size, currently measuring 2.2 x 2.0 cm on image 66/3, compared to 2.1 x 1.7 cm previously. This mass abuts the major fissure. No other suspicious pulmonary nodules identified. No evidence of pulmonary infiltrate or pleural effusion. Upper Abdomen: Unremarkable. Both adrenal glands are normal in appearance. Musculoskeletal:  No suspicious bone lesions. IMPRESSION: Slight increase in size of 2.2 cm spiculated nodule in posterior right upper lobe, consistent with primary bronchogenic carcinoma. This nodule abuts the major fissure. Stable mild right hilar and mediastinal lymphadenopathy, consistent with metastatic disease. 2.1 cm left thyroid lobe nodule. Recommend thyroid US. (ref: J Am Coll Radiol. 2015 Feb;12(2): 143-50). Aortic Atherosclerosis (ICD10-I70.0). Electronically Signed   By: Marlaine Hind M.D.   On: 02/23/2020 10:32   DG C-ARM BRONCHOSCOPY  Result Date: 02/25/2020 C-ARM BRONCHOSCOPY: Fluoroscopy was utilized by the requesting physician.  No radiographic interpretation.       IMPRESSION/PLAN: 1. 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 Dr. [name] discusses the pathology findings and reviews the nature of metastatic lung cancer with brain disease.  His case has been discussed in multidisciplinary brain oncology conference, and he appears to be a good candidate for stereotactic radiosurgery [facility]).  It is taking a while to get his community care referral, however they are moving forward with this process.  He understands the rationale for 3T imaging to be able to plan his treatment and the rationale for neurosurgery to be part of his planning.  He is scheduled for Saturday for his MRI, and  will present next week for simulation.  We discussed the risks, benefits, short, and long term effects of radiotherapy, and the patient is interested in proceeding. Dr. [name] discusses the delivery and logistics of radiotherapy and anticipates a course of a single fraction of stereotactic radiosurgery to the lesion in the frontal lobe.  He will continue with plans to meet with Dr. Julien Nordmann and his team to discuss treatment recommendations for his cancer as well. 2. Nutritional concerns. The patient's sister requests more information on health dietary guidance. A referral will be made to the nutritionist here at the cancer center. 3. Covid Vaccination Status. The patient is not vaccinated, and his sister has significant concerns about the validity and efficacy of the vaccines. I  spent time today trying to share information about the safety and established results of the covid vaccines available. At this time neither are interested in vaccination. I suggested they also discuss this further with Dr. Julien Nordmann.   Given current concerns for patient exposure during the COVID-19 pandemic, this encounter was conducted via telephone.  The patient has provided two factor identification and has given verbal consent for this type of encounter and has been advised to only accept a meeting of this type in a secure network environment. The time spent during this encounter was 60 minutes including preparation, discussion, and coordination of the patient's care. The attendants for this meeting include Blenda Nicely, RN, Dr. [name], Mont Dutton, RT, Hayden Pedro  and [place].  During the encounter,  Blenda Nicely, RN, Dr. [name], Mont Dutton, RT, and Hayden Pedro were located at [facility] Radiation Oncology Department.  Hakeem Frazzini was located at home.   The above documentation reflects my direct findings during this shared patient visit. Please see the separate note by Dr. [name] on this  date for the remainder of the patient's plan of care.    Carola Rhine, PAC

## 2020-03-09 NOTE — Progress Notes (Signed)
Has armband been applied?  Yes  Does patient have an allergy to IV contrast dye?: No   Has patient ever received premedication for IV contrast dye?: n/a  Does patient take metformin?: No  If patient does take metformin when was the last dose:n/a  Date of lab work: 02/19/2020 BUN: 15 CR: 1.50 EGfr: 56.80  IV site: Right AC  Has IV site been added to flowsheet?  Yes

## 2020-03-10 ENCOUNTER — Emergency Department (HOSPITAL_COMMUNITY)
Admission: EM | Admit: 2020-03-10 | Discharge: 2020-03-10 | Discharge status: Absent without leave | Payer: Non-veteran care

## 2020-03-10 ENCOUNTER — Other Ambulatory Visit: Payer: Self-pay

## 2020-03-10 ENCOUNTER — Ambulatory Visit
Admission: RE | Admit: 2020-03-10 | Discharge: 2020-03-10 | Disposition: A | Discharge status: Home / Self Care | Payer: No Typology Code available for payment source | Source: Ambulatory Visit | Attending: Radiation Oncology | Admitting: Radiation Oncology

## 2020-03-10 VITALS — BP 144/96 | HR 73 | Temp 98.7°F | Resp 18 | Ht 73.0 in | Wt 180.4 lb

## 2020-03-10 DIAGNOSIS — C7931 Secondary malignant neoplasm of brain: Secondary | ICD-10-CM | POA: Insufficient documentation

## 2020-03-10 DIAGNOSIS — Z510 Encounter for antineoplastic radiation therapy: Secondary | ICD-10-CM | POA: Diagnosis not present

## 2020-03-10 MED ORDER — SODIUM CHLORIDE 0.9% FLUSH
10.0000 mL | Freq: Once | INTRAVENOUS | Status: AC
  Administered 2020-03-10: 10 mL via INTRAVENOUS

## 2020-03-11 ENCOUNTER — Encounter: Payer: Self-pay | Admitting: General Practice

## 2020-03-11 NOTE — Progress Notes (Addendum)
[facility] Initial Psychosocial Assessment Clinical Social Work  Clinical Social Work contacted by phone to assess psychosocial, emotional, mental health, and spiritual needs of the patient.   Barriers to care/review of distress screen:  - Transportation:  Do you anticipate any problems getting to appointments?  Do you have someone who can help run errands for you if you need it? - Help at home:  What is your living situation (alone, family, other)?  If you are physically unable to care for yourself, who would you call on to help you?  Lives alone.  Has Section 8 voucher from [employer].  Was placed in current apartment approx one year after transitioning out of shelter run by [employer].   - Support system:  What does your support system look like?  Who would you call on if you needed some kind of practical help?  What if you needed someone to talk to for emotional support? - Finances:  Are you concerned about finances.  Considering returning to work?  If not, applying for disability?  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  Was in process of applying for a new job as Presenter, broadcasting, had too many doctors appointments to be able to continue the process.    What is your understanding of where you are with your cancer? Its cause?  Your treatment plan and what happens next?  [facility] PCP wanted him to have X ray, pt was "feeling fine."  X ray revealed nodules on lung, then subsequent scans revealed nodules on his brain.  Was not aware of any physical limitations, no shortness of breath or fatigue that are limiting him.    What are your worries for the future as you begin treatment for cancer?  Worries have been reduced due to communications from oncologists, feels hopeful about treatment.  Biggest worry is financial - is unable to work and cannot pay for utilities at this time.    What are your hopes and priorities during your treatment? What is important  to you? What are your goals for your care?  Wants to be stable financially.  "I would like to be in great shape, healthy, be glad when its over."     CSW Summary:  Patient and family psychosocial functioning including strengths, limitations, and coping skills:  Randy Andersen is a single 64 year old man, newly diagnosed w Stage IV NSCLC w brain metastasis.  He will begin radiation treatment and will meet w medical oncology as well to plan treatment.  He lives on his own in an apartment paid for under a voucher program.  This was arranged after he exited from a shelter living situation in [place].  He is responsible for paying for utilities - unfortunately his electricity was turned off today and he feels his gas will be shut off soon.  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  Since diagnosis, he has been unable to work thus he has not been able to pay his utility bills.  While hopeful about his treatment prospects, he is also distressed about his financial situation.  He does have a [facility] PCP, who initially diagnosed his lung cancer.  The VA may have resources to help him.    Identifications of barriers to care:  Very limited income from [place] disability.  No other income.  Cannot work as result of illness.    Availability of community resources:  Explore options including [place], [employer], Advertising account executive, [employer].  Clinical Social Worker follow up needed: Yes.    Will need to follow patient to help address psychosocial needs.  Edwyna Shell, LCSW Clinical Social Worker Phone:  [phone]

## 2020-03-12 ENCOUNTER — Encounter: Payer: Self-pay | Admitting: General Practice

## 2020-03-12 NOTE — Progress Notes (Signed)
[facility] CSW Progress Notes  Referred patient to [employer], they have paid his past due power bill and service is restored (Confirm # for [employer] commitment is [id number]). He is aware he needs to present there on Monday w proof of income, receipts for expenses and power bill.  CSW will provide them w referral letter. He will apply for [employer] for help w other expenses.  He has been asked to contact Social Security to inquire about his retirement benefits - if eligible he can access these as he is 55.  His VA CSW is Cyndee Brightly [phone] Ext  [phone] - called and left her VM asking her to reach out to client w any help possible.  Patient also asked to contact his caseworker at [employer] re any help they can provide to him.    Edwyna Shell, LCSW Clinical Social Worker Phone:  [phone] Cell:  [phone]

## 2020-03-13 ENCOUNTER — Other Ambulatory Visit: Payer: Self-pay

## 2020-03-13 ENCOUNTER — Ambulatory Visit (HOSPITAL_COMMUNITY)
Admission: RE | Admit: 2020-03-13 | Discharge: 2020-03-13 | Disposition: A | Discharge status: Home / Self Care | Payer: No Typology Code available for payment source | Source: Ambulatory Visit | Attending: Radiation Oncology | Admitting: Radiation Oncology

## 2020-03-13 DIAGNOSIS — C7949 Secondary malignant neoplasm of other parts of nervous system: Secondary | ICD-10-CM | POA: Insufficient documentation

## 2020-03-13 DIAGNOSIS — C7931 Secondary malignant neoplasm of brain: Secondary | ICD-10-CM

## 2020-03-13 IMAGING — MR MR HEAD WO/W CM
9 of 12 series · 26 of 48 positions shown · IV contrast (gadavist)
Comparison: None.

CLINICAL DATA: Metastatic lung carcinoma. Stereotactic radio
surgery planning.

EXAM:
MRI HEAD WITHOUT AND WITH CONTRAST
TECHNIQUE: Multiplanar, multiecho pulse sequences of the brain and surrounding
structures were obtained without and with intravenous contrast.
CONTRAST:  8mL GADAVIST GADOBUTROL 1 MMOL/ML IV SOLN

[Series 2: FLAIR · sagittal · 3.0mm · 0.47mm/px · 3 of 42 slices shown (1 of 2)]
[im 1/42]
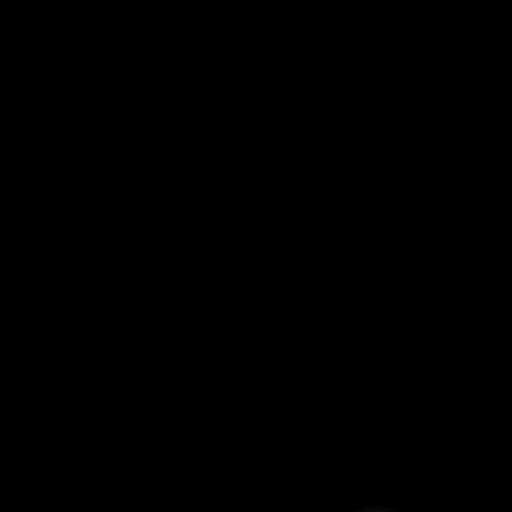
[im 21/42]
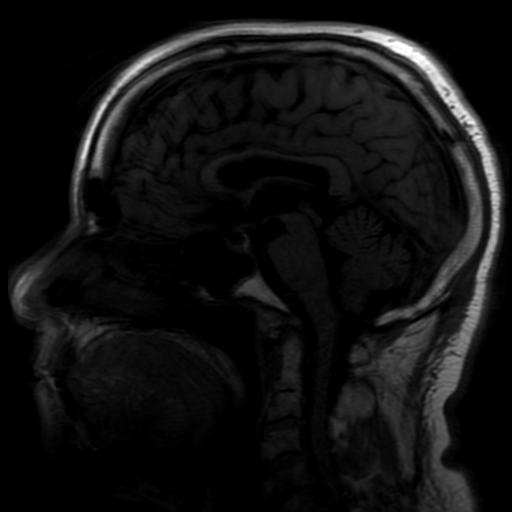
[im 42/42]
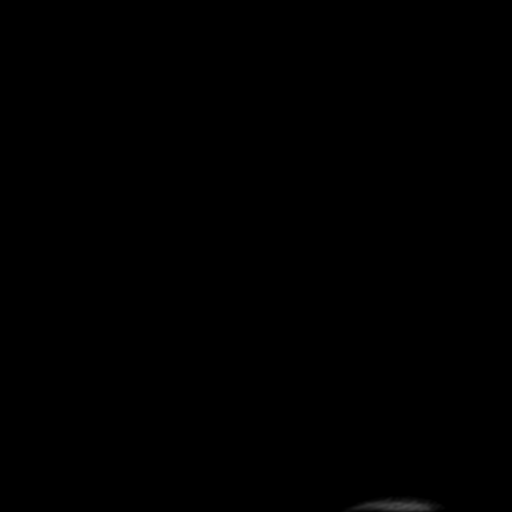

[Series 3: DWI · axial · 3.0mm · 0.94mm/px · z∈[-68,+139]mm · 6 of 140 slices shown]
[im 1/140]
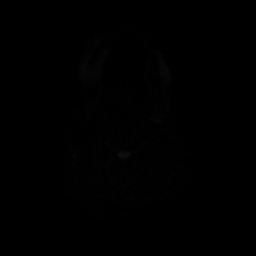
[im 28/140]
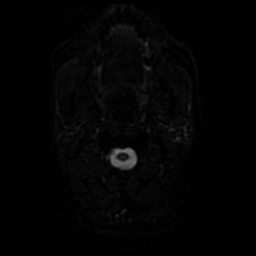
[im 56/140]
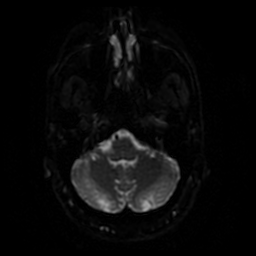
[im 84/140]
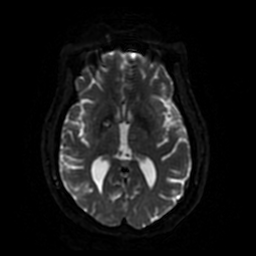
[im 112/140]
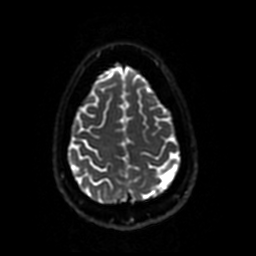
[im 140/140]
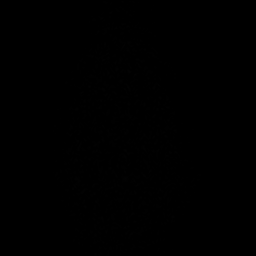

[Series 4: T2 · axial · 5.0mm · 0.47mm/px · 1 of 35 slices shown]
[im 1/35]
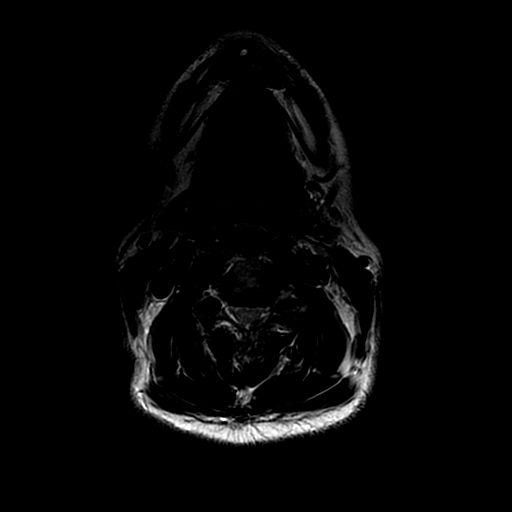

[Series 5: FLAIR · axial · 3.0mm · 0.47mm/px · z∈[-58,+143]mm · 3 of 68 slices shown (2 of 2)]
[im 1/68]
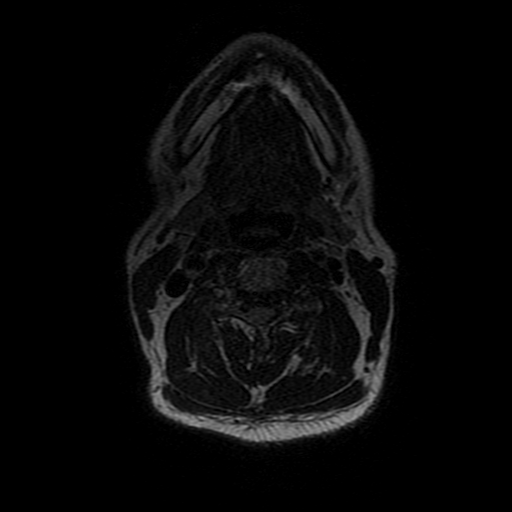
[im 34/68]
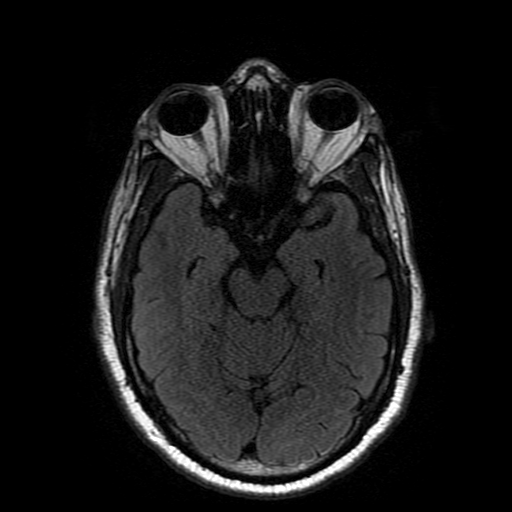
[im 68/68]
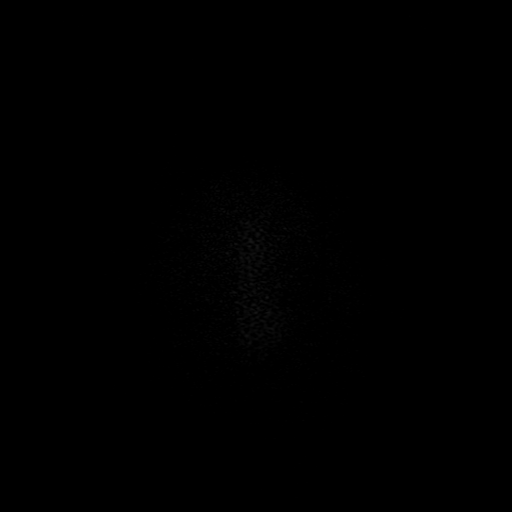

[Series 6: SWI · axial · 3.0mm · 0.47mm/px · z∈[-44,+94]mm · 4 of 124 slices shown]
[im 1/124]
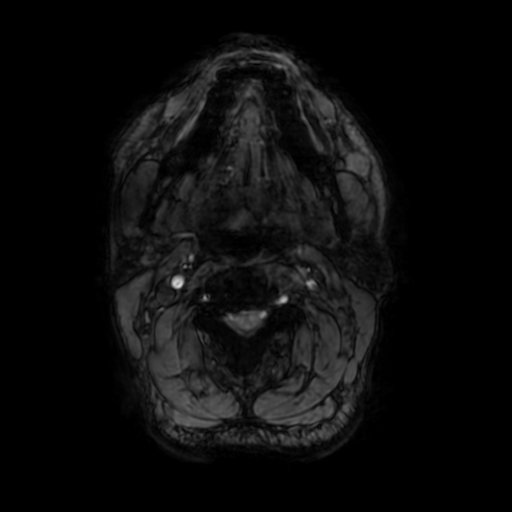
[im 31/124]
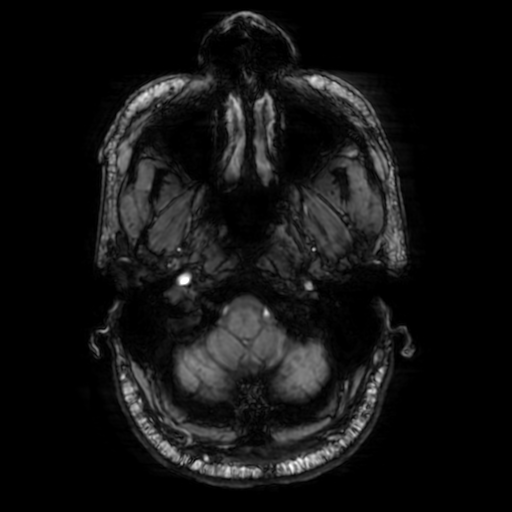
[im 62/124]
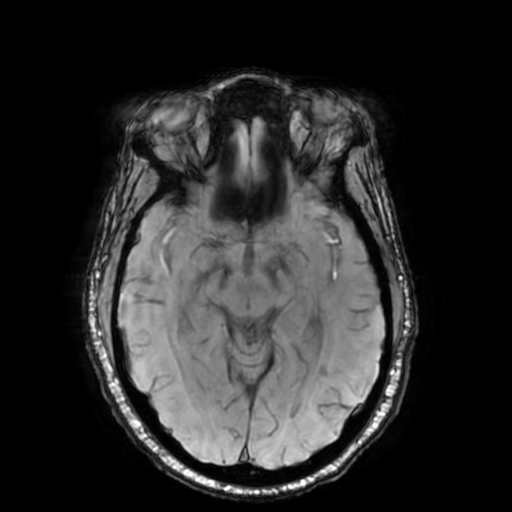
[im 93/124]
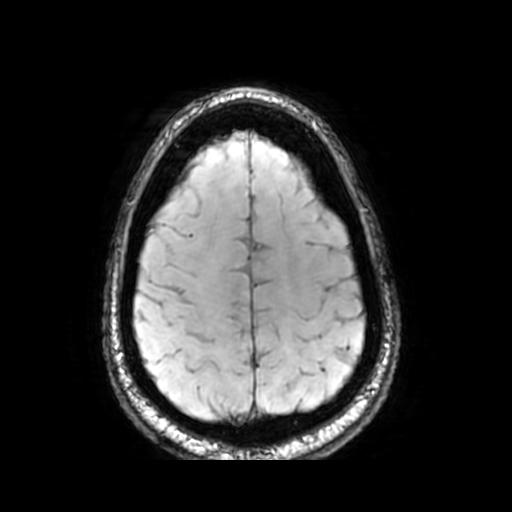

[Series 9: T2 post-contrast · coronal · 3.0mm · 0.39mm/px · 2 of 58 slices shown]
[im 1/58]
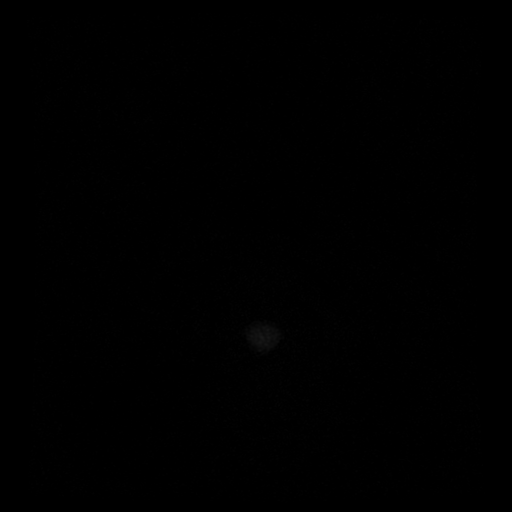
[im 58/58]
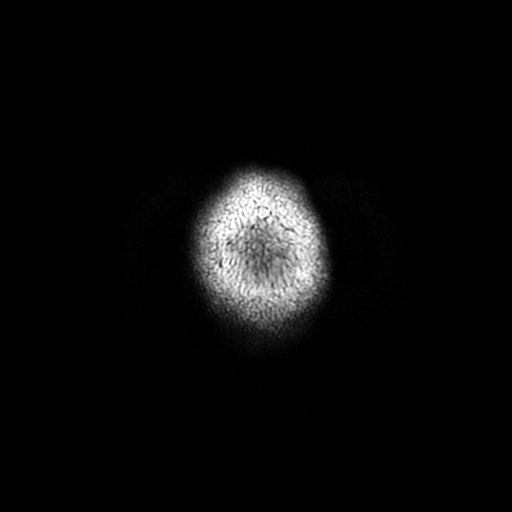

[Series 11: T1 post-contrast · coronal · 3.0mm · 0.39mm/px · 2 of 58 slices shown]
[im 1/58]
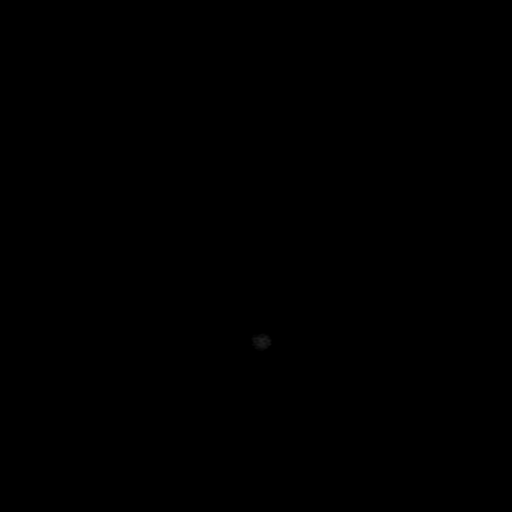
[im 58/58]
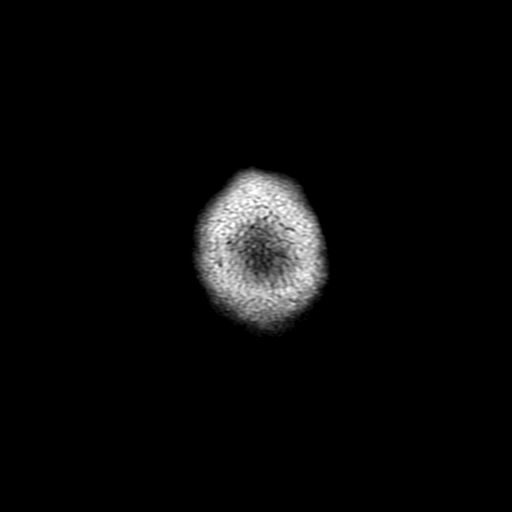

[Series 12: FLAIR post-contrast · sagittal · 3.0mm · 0.47mm/px · 2 of 42 slices shown]
[im 1/42]
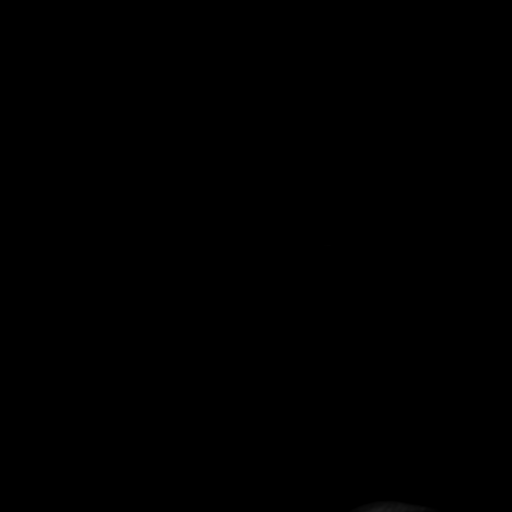
[im 42/42]
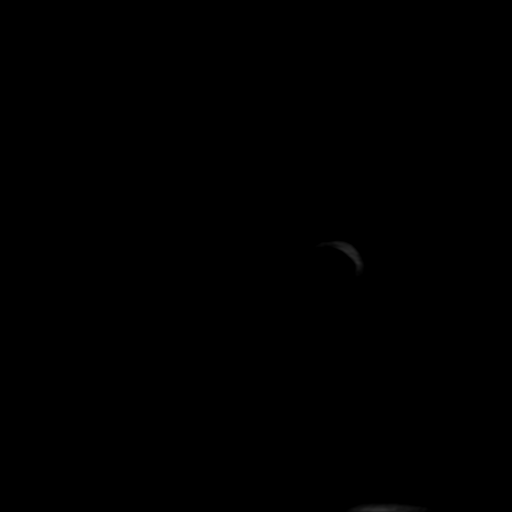

[Series 350: ADC · axial · 3.0mm · 0.94mm/px · z∈[-68,+139]mm · 3 of 68 slices shown]
[im 1/68]
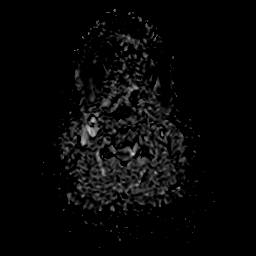
[im 34/68]
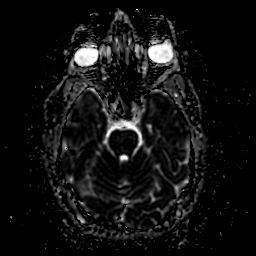
[im 68/68]
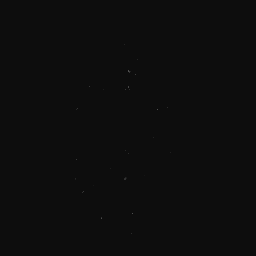

[26 of 48 positions shown; findings below may reference images not displayed]

FINDINGS: Brain: There are 2 contrast-enhancing lesions. One is along the left
precentral gyrus and measures 4 mm (series 10, image 161). The other
is at the medial aspect of the paramedian right parietal lobe
(series 10, image 150) and measures 4 mm. There is no acute infarct
or hemorrhage. No midline shift or other mass effect. There are old
small vessel infarcts of the basal ganglia bilaterally.

Vascular: Normal flow voids.

Skull and upper cervical spine: Normal marrow signal.

Sinuses/Orbits: Negative.

Other: None.
IMPRESSION: Two contrast-enhancing metastatic lesions measuring up to 4 mm.

## 2020-03-13 MED ORDER — GADOBUTROL 1 MMOL/ML IV SOLN
8.0000 mL | Freq: Once | INTRAVENOUS | Status: AC | PRN
  Administered 2020-03-13: 8 mL via INTRAVENOUS

## 2020-03-14 NOTE — Progress Notes (Signed)
[place] Telephone:([phone]   Fax:([phone]  CONSULT NOTE  REFERRING PHYSICIAN: Dr. Lamonte Sakai   REASON FOR CONSULTATION:  Stage IV non-small cell lung cancer  HPI Randy Andersen is a 64 y.o. 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   The patient had seen his PCP at the [place], Dr. Clydene Laming, for a routine wellness visit in July 2021.  The patient was not having any symptoms at that time but the patient had a chest x-ray performed that day which was reportedly abnormal and concerning for lung malignancy.  The [facility] as a PET scan on 01/30/2020 which showed  hypermetabolism in the posterior right upper lobe nodule with relative broad segment along the major fissure. There is mild hypermetabolic activity is seen within the small lineal opacity in the surrounding right upper lobe indicative of bronchiolar obstruction.  There was also hypermetabolic activity within a 1.5 cm nodule in the inferior in the skin/subcutaneous fat of the left axilla with an SUV max 11.5. There was also mild activity in the left axillary lymph node.   The patient had a brain MRI which noted a 0.3 cm solidity brain metastasis in the left cerebral hemisphere. She saw radiation oncology who is planning on doing SRS to the brain lesion on 03/18/20   The patient had a bronchoscopy performed by Dr. Lamonte Sakai on 8/17. The pathology was most consistent with adenocarcinoma.  There was insufficient tissue for further molecular studies.  The patient is feeling well today.  He denies any recent fever, chills, or night sweats.  He lost approximately 10 pounds over the last 2 to 3 months.  He denies any chest pain, shortness of breath, cough, or any hemoptysis.  The  patient had some mild hemoptysis after his bronchoscopy which has improved at this time.  He denies any nausea, vomiting, diarrhea, or constipation.  He denies any headache or visual changes.  The patient's family history consist of a mother who had gastric cancer.  The patient's father had HIV.  The patient has 2 sisters and a brother who are reportedly healthy.  The patient denies any other family history of malignancy.  The patient used to work in [employer].  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  The patient is single and does not have any children.  He denies any drug or alcohol use.  The patient states that he smoked approximately 40 years and averages that he smoked approximately half a pack to 1 pack/day.   HPI  Past Medical History:  Diagnosis Date  . Asthma    as a child  . Chronic pain disorder    LT leg and foot  . Heart murmur    as a child  . Hypertension   . Malignant neoplasm of upper lobe of right lung ([place])   . Stroke [facility])    mini stroke - 2015, some tingling in fingers on right     Past Surgical History:  Procedure Laterality Date  . BUNIONECTOMY Left    had hammer toe surgery also and callus removed  . BUNIONECTOMY Right    also had hammer toe surgery and callus removed  . LEG SURGERY Left  PT reports he has pins placed in his Lt leg  . ORIF TIBIA PLATEAU  10/09/2012   Dr Lorin Mercy  . ORIF TIBIA PLATEAU Left 10/08/2012   Procedure: OPEN REDUCTION INTERNAL FIXATION (ORIF) TIBIAL PLATEAU;  Surgeon: Marybelle Killings, MD;  Location: [place];  Service: Orthopedics;  Laterality: Left;  Marland Kitchen VIDEO BRONCHOSCOPY WITH ENDOBRONCHIAL NAVIGATION N/A 02/25/2020   Procedure: VIDEO BRONCHOSCOPY WITH ENDOBRONCHIAL NAVIGATION with biopsies;  Surgeon: Collene Gobble, MD;  Location: [facility] OR;  Service: Thoracic;  Laterality: N/A;  . VIDEO BRONCHOSCOPY WITH ENDOBRONCHIAL ULTRASOUND N/A 02/25/2020   Procedure: VIDEO BRONCHOSCOPY WITH ENDOBRONCHIAL ULTRASOUND;   Surgeon: Collene Gobble, MD;  Location: [facility] OR;  Service: Thoracic;  Laterality: N/A;    Family History  Problem Relation Age of Onset  . Cancer Mother        Intestinal metastatic to ovaries    Social History Social History   Tobacco Use  . Smoking status: Current Every Day Smoker    Packs/day: 0.50    Years: 28.00    Pack years: 14.00    Types: Cigarettes  . Smokeless tobacco: Never Used  . Tobacco comment: 7 cigatettes daily 02/19/20 [facility]   Vaping Use  . Vaping Use: Never used  Substance Use Topics  . Alcohol use: No  . Drug use: No    No Known Allergies  Current Outpatient Medications  Medication Sig Dispense Refill  . amLODipine (NORVASC) 10 MG tablet Take 10 mg by mouth daily.    Marland Kitchen doxylamine, Sleep, (UNISOM) 25 MG tablet Take 25 mg by mouth at bedtime as needed for sleep. (Patient not taking: Reported on 03/09/2020)    . HYDROcodone-acetaminophen (NORCO) 10-325 MG tablet Take 1 tablet by mouth 2 (two) times daily.     No current facility-administered medications for this visit.    REVIEW OF SYSTEMS:   Review of Systems  Constitutional: Positive for weight loss. Negative for appetite change, chills, fatigue, and fever.  HENT:   Negative for mouth sores, nosebleeds, sore throat and trouble swallowing.   Eyes: Negative for eye problems and icterus.  Respiratory: Negative for cough, hemoptysis, shortness of breath and wheezing.   Cardiovascular: Negative for chest pain and leg swelling.  Gastrointestinal: Negative for abdominal pain, constipation, diarrhea, nausea and vomiting.  Genitourinary: Negative for bladder incontinence, difficulty urinating, dysuria, frequency and hematuria.   Musculoskeletal: Negative for back pain, gait problem, neck pain and neck stiffness.  Skin: Negative for itching and rash.  Neurological: Negative for dizziness, extremity weakness, gait problem, headaches, light-headedness and seizures.  Hematological: Negative for adenopathy. Does not  bruise/bleed easily.  Psychiatric/Behavioral: Negative for confusion, depression and sleep disturbance. The patient is not nervous/anxious.     PHYSICAL EXAMINATION:  Blood pressure (!) 143/84, pulse 73, temperature 97.8 F (36.6 C), temperature source Tympanic, resp. rate 17, height 6\' 1"  (1.854 m), weight 181 lb 9.6 oz (82.4 kg), SpO2 100 %.  ECOG PERFORMANCE STATUS: 0  Physical Exam  Constitutional: Oriented to person, place, and time and well-developed, well-nourished, and in no distress.  HENT:  Head: Normocephalic and atraumatic.  Mouth/Throat: Oropharynx is clear and moist. No oropharyngeal exudate.  Eyes: Conjunctivae are normal. Right eye exhibits no discharge. Left eye exhibits no discharge. No scleral icterus.  Neck: Normal range of motion. Neck supple.  Cardiovascular: Normal rate, regular rhythm, normal heart sounds and intact distal pulses.   Pulmonary/Chest: Effort normal and breath sounds normal. No respiratory distress. No wheezes. No rales.  Abdominal: Soft. Bowel  sounds are normal. Exhibits no distension and no mass. There is no tenderness.  Musculoskeletal: Normal range of motion. Exhibits no edema.  Lymphadenopathy:    No cervical adenopathy.  Neurological: Alert and oriented to person, place, and time. Exhibits normal muscle tone. Gait normal. Coordination normal.  Skin: Skin is warm and dry. No rash noted. Not diaphoretic. No erythema. No pallor.  Psychiatric: Mood, memory and judgment normal.  Vitals reviewed.  LABORATORY DATA: Lab Results  Component Value Date   WBC 5.9 02/19/2020   HGB 13.8 02/19/2020   HCT 41.4 02/19/2020   MCV 86.3 02/19/2020   PLT 233.0 02/19/2020      Chemistry      Component Value Date/Time   NA 139 02/19/2020 1504   K 3.5 02/19/2020 1504   CL 106 02/19/2020 1504   CO2 29 02/19/2020 1504   BUN 15 02/19/2020 1504   CREATININE 1.50 02/19/2020 1504      Component Value Date/Time   CALCIUM 9.7 02/19/2020 1504   ALKPHOS 103  11/16/2013 1425   AST 18 11/16/2013 1425   ALT 17 11/16/2013 1425   BILITOT 0.4 11/16/2013 1425       RADIOGRAPHIC STUDIES: MR Brain W Wo Contrast  Result Date: 03/14/2020 CLINICAL DATA:  Metastatic lung carcinoma. Stereotactic radio surgery planning. EXAM: MRI HEAD WITHOUT AND WITH CONTRAST TECHNIQUE: Multiplanar, multiecho pulse sequences of the brain and surrounding structures were obtained without and with intravenous contrast. CONTRAST:  29mL GADAVIST GADOBUTROL 1 MMOL/ML IV SOLN COMPARISON:  None. FINDINGS: Brain: There are 2 contrast-enhancing lesions. One is along the left precentral gyrus and measures 4 mm (series 10, image 161). The other is at the medial aspect of the paramedian right parietal lobe (series 10, image 150) and measures 4 mm. There is no acute infarct or hemorrhage. No midline shift or other mass effect. There are old small vessel infarcts of the basal ganglia bilaterally. Vascular: Normal flow voids. Skull and upper cervical spine: Normal marrow signal. Sinuses/Orbits: Negative. Other: None. IMPRESSION: Two contrast-enhancing metastatic lesions measuring up to 4 mm. Electronically Signed   By: Ulyses Jarred M.D.   On: 03/14/2020 03:26   DG Chest Port 1 View  Result Date: 02/25/2020 CLINICAL DATA:  64 year old male status post bronchoscopy EXAM: PORTABLE CHEST 1 VIEW COMPARISON:  Chest CT dated 02/23/2020. FINDINGS: Right upper lobe nodule as seen on the prior CT. No new consolidation. There is no pleural effusion or pneumothorax. The cardiac silhouette is within limits. No acute osseous pathology. Atherosclerotic calcification of the aorta. IMPRESSION: No acute cardiopulmonary process. No pneumothorax status post bronchoscopy of the right upper lobe nodule. Electronically Signed   By: Anner Crete M.D.   On: 02/25/2020 15:56   CT Super D Chest Wo Contrast  Result Date: 02/23/2020 CLINICAL DATA:  Right lung nodule. EXAM: CT CHEST WITHOUT CONTRAST TECHNIQUE: Multidetector  CT imaging of the chest was performed using thin slice collimation for electromagnetic bronchoscopy planning purposes, without intravenous contrast. COMPARISON:  Chest CT from [facility] dated 01/01/2020 FINDINGS: Cardiovascular: No acute findings. Aortic and coronary artery atherosclerosis noted. Mediastinum/Nodes: Right hilar lymphadenopathy with largest node measuring 1.8 cm in short axis shows no significant change. Mild lymphadenopathy in the precarinal region measures 10 mm in short axis, and mild subcarinal lymphadenopathy measures 16 mm, also without significant change. No other sites of lymphadenopathy are identified. A 2.1 cm heterogeneous low-attenuation left thyroid lobe nodule is also noted. Lungs/Pleura: Spiculated nodule in posterior right upper lobe shows slight increase in  size, currently measuring 2.2 x 2.0 cm on image 66/3, compared to 2.1 x 1.7 cm previously. This mass abuts the major fissure. No other suspicious pulmonary nodules identified. No evidence of pulmonary infiltrate or pleural effusion. Upper Abdomen: Unremarkable. Both adrenal glands are normal in appearance. Musculoskeletal:  No suspicious bone lesions. IMPRESSION: Slight increase in size of 2.2 cm spiculated nodule in posterior right upper lobe, consistent with primary bronchogenic carcinoma. This nodule abuts the major fissure. Stable mild right hilar and mediastinal lymphadenopathy, consistent with metastatic disease. 2.1 cm left thyroid lobe nodule. Recommend thyroid US. (ref: J Am Coll Radiol. 2015 Feb;12(2): 143-50). Aortic Atherosclerosis (ICD10-I70.0). Electronically Signed   By: Marlaine Hind M.D.   On: 02/23/2020 10:32   DG C-ARM BRONCHOSCOPY  Result Date: 02/25/2020 C-ARM BRONCHOSCOPY: Fluoroscopy was utilized by the requesting physician.  No radiographic interpretation.    ASSESSMENT: This is a very pleasant 64 year old African-American male with stage IV non-small cell lung cancer, favored to be adenocarcinoma.  He  presented with posterior right upper lobe nodule as well as right hilar, infrahilar, subcarinal, and right paratracheal lymphadenopathy.  He also has a 1.4 cm x 1 cm soft tissue nodule in the skin/subcutaneous fat in the left axilla.  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  He was diagnosed in August 2021.  Molecular studies by [facility] are pending.  PLAN: Dr. Julien Nordmann had a lengthly discussion with the patient today about her current condition and treatment options.  There was none of tissue from his biopsy to send for molecular studies.  Therefore, we will arrange for the patient to have molecular studies by [facility] today.  We will see the patient back for follow-up visit in 2 weeks for evaluation and to review the results of his molecular studies.  If his molecular studies show that he is not a candidate for targeted therapy, we will likely discuss systemic palliative chemotherapy/immunotherapy treatment options at his next visit.  In the meantime, he will have his [place] treatment to the metastatic brain lesion on 03/18/20 as planned.  I will also arrange for the patient to have a thyroid ultrasound and biopsy if he meets criteria for biopsy to further evaluate the hypermetabolic thyroid nodule noted on his recent staging PET scan  The patient voices understanding of current disease status and treatment options and is in agreement with the current care plan.  All questions were answered. The patient knows to call the clinic with any problems, questions or concerns. We can certainly see the patient much sooner if necessary.  Thank you so much for allowing me to participate in the care of Randy Andersen. I will continue to follow up the patient with you and assist in his care.  The total time spent in the appointment was 70 minutes.  Disclaimer: This note was dictated with voice recognition software. Similar sounding words can inadvertently be transcribed and may  not be corrected upon review.   Randy Andersen March 14, 2020, 4:56 PM  ADDENDUM: Hematology/Oncology Attending: I had a face-to-face encounter with the patient today.  I recommended his care plan.  This is a very pleasant 64 years old African-American male with past medical history significant for stroke, hypertension as well as neuropathy.  The patient was seen by his primary care physician at the [facility] facility in July 2021 and as part of his evaluation he had chest x-ray performed.  The x-ray showed a suspicious abnormality in the  right lung.  This was followed by CT scan of the chest at the [facility] followed by a PET scan and it showed hypermetabolism in the posterior right upper lobe nodule with mild hypermetabolic activity seen in the surrounding right upper lobe indicative of bronchiolar obstruction.  There was also hypermetabolic activity in 1.5 cm nodule in the inferior skin/subcutaneous fat of the left axilla.  There was also hypermetabolic activity in the right hilar/infrahilar, subcarinal as well as right paratracheal lymphadenopathy.  There was also small pulmonary nodules that are too small to be detected with the PET scan.  The scan also showed mild activity and pericaval lymph nodes suspicious for metastasis. He had MRI of the brain that showed 0.3 cm solitary brain metastasis in the left cerebral hemisphere and he is expected to have SRS to this lesion on 03/18/2020.  The patient was seen by Dr. Lamonte Sakai and he underwent bronchoscopy on 03/09/2020 and the final pathology was consistent with adenocarcinoma but there was insufficient tissue for molecular studies. The patient was referred to [place] today for evaluation and recommendation regarding treatment of his condition. I had a lengthy discussion with the patient today about his current disease stage, prognosis and treatment options. The patient has a stage IV (T1c, N2, M1c) non-small cell lung cancer, adenocarcinoma. I explained to the  patient that he has incurable condition and all the treatment will be of palliative nature. The patient has insufficient material for molecular studies and I recommended for him to have blood test by [facility] for evaluation of any actionable mutations. I recommended for the patient to proceed with the [facility] treatment for the solitary brain metastasis as recommended by Dr. [name]. I will see him back for follow-up visit in around 10 days for evaluation and more detailed discussion of his treatment options based on the final molecular studies. If the patient has any actionable mutations, he will be treated with targeted therapy but if he has no actionable mutations, will consider him for systemic chemotherapy with carboplatin, Alimta and Keytruda but will also discuss palliative care and hospice with him. The patient agreed to the current plan. He was advised to call immediately if he has any concerning symptoms in the interval.  Disclaimer: This note was dictated with voice recognition software. Similar sounding words can inadvertently be transcribed and may be missed upon review. Eilleen Kempf, MD 03/16/20

## 2020-03-15 ENCOUNTER — Encounter: Payer: Self-pay | Admitting: *Deleted

## 2020-03-15 ENCOUNTER — Telehealth: Payer: Self-pay | Admitting: Nutrition

## 2020-03-15 DIAGNOSIS — Z510 Encounter for antineoplastic radiation therapy: Secondary | ICD-10-CM | POA: Diagnosis not present

## 2020-03-15 NOTE — Progress Notes (Signed)
[place] Clinical Social Work  [facility] representative contacted [employer] to follow up on additional resources and support for patient.  CSW left a VM with contact information and request for return call.  Johnnye Lana, MSW, LCSW, OSW-C Clinical Social Worker [facility] [phone]

## 2020-03-16 ENCOUNTER — Ambulatory Visit: Payer: No Typology Code available for payment source | Admitting: Radiation Oncology

## 2020-03-16 ENCOUNTER — Telehealth: Payer: Self-pay | Admitting: Physician Assistant

## 2020-03-16 ENCOUNTER — Inpatient Hospital Stay: Payer: No Typology Code available for payment source

## 2020-03-16 ENCOUNTER — Inpatient Hospital Stay: Payer: No Typology Code available for payment source | Admitting: Nutrition

## 2020-03-16 ENCOUNTER — Encounter: Payer: Self-pay | Admitting: Physician Assistant

## 2020-03-16 ENCOUNTER — Inpatient Hospital Stay: Payer: No Typology Code available for payment source | Attending: Physician Assistant | Admitting: Physician Assistant

## 2020-03-16 ENCOUNTER — Other Ambulatory Visit: Payer: Self-pay

## 2020-03-16 VITALS — BP 143/84 | HR 73 | Temp 97.8°F | Resp 17 | Ht 73.0 in | Wt 181.6 lb

## 2020-03-16 DIAGNOSIS — C3490 Malignant neoplasm of unspecified part of unspecified bronchus or lung: Secondary | ICD-10-CM | POA: Diagnosis not present

## 2020-03-16 DIAGNOSIS — I10 Essential (primary) hypertension: Secondary | ICD-10-CM | POA: Diagnosis not present

## 2020-03-16 DIAGNOSIS — G629 Polyneuropathy, unspecified: Secondary | ICD-10-CM | POA: Diagnosis not present

## 2020-03-16 DIAGNOSIS — Z800 Family history of malignant neoplasm of digestive organs: Secondary | ICD-10-CM

## 2020-03-16 DIAGNOSIS — R590 Localized enlarged lymph nodes: Secondary | ICD-10-CM

## 2020-03-16 DIAGNOSIS — F17210 Nicotine dependence, cigarettes, uncomplicated: Secondary | ICD-10-CM

## 2020-03-16 DIAGNOSIS — C3411 Malignant neoplasm of upper lobe, right bronchus or lung: Secondary | ICD-10-CM

## 2020-03-16 DIAGNOSIS — C7931 Secondary malignant neoplasm of brain: Secondary | ICD-10-CM

## 2020-03-16 DIAGNOSIS — Z510 Encounter for antineoplastic radiation therapy: Secondary | ICD-10-CM | POA: Diagnosis not present

## 2020-03-16 LAB — CMP (CANCER CENTER ONLY)
ALT: 9 U/L (ref 0–44)
AST: 12 U/L — ABNORMAL LOW (ref 15–41)
Albumin: 3.9 g/dL (ref 3.5–5.0)
Alkaline Phosphatase: 115 U/L (ref 38–126)
Anion gap: 9 (ref 5–15)
BUN: 11 mg/dL (ref 8–23)
CO2: 26 mmol/L (ref 22–32)
Calcium: 9.9 mg/dL (ref 8.9–10.3)
Chloride: 105 mmol/L (ref 98–111)
Creatinine: 1.46 mg/dL — ABNORMAL HIGH (ref 0.61–1.24)
GFR, Est AFR Am: 58 mL/min — ABNORMAL LOW (ref >60)
GFR, Estimated: 50 mL/min — ABNORMAL LOW (ref >60)
Glucose, Bld: 108 mg/dL — ABNORMAL HIGH (ref 70–99)
Potassium: 3.6 mmol/L (ref 3.5–5.1)
Sodium: 140 mmol/L (ref 135–145)
Total Bilirubin: 0.5 mg/dL (ref 0.3–1.2)
Total Protein: 7.5 g/dL (ref 6.5–8.1)

## 2020-03-16 LAB — CBC WITH DIFFERENTIAL (CANCER CENTER ONLY)
Abs Immature Granulocytes: 0.01 10*3/uL (ref 0.00–0.07)
Basophils Absolute: 0.1 10*3/uL (ref 0.0–0.1)
Basophils Relative: 1 %
Eosinophils Absolute: 0.2 10*3/uL (ref 0.0–0.5)
Eosinophils Relative: 3 %
HCT: 40.5 % (ref 39.0–52.0)
Hemoglobin: 13.4 g/dL (ref 13.0–17.0)
Immature Granulocytes: 0 %
Lymphocytes Relative: 31 %
Lymphs Abs: 1.8 10*3/uL (ref 0.7–4.0)
MCH: 28.5 pg (ref 26.0–34.0)
MCHC: 33.1 g/dL (ref 30.0–36.0)
MCV: 86.2 fL (ref 80.0–100.0)
Monocytes Absolute: 0.7 10*3/uL (ref 0.1–1.0)
Monocytes Relative: 11 %
Neutro Abs: 3.2 10*3/uL (ref 1.7–7.7)
Neutrophils Relative %: 54 %
Platelet Count: 232 10*3/uL (ref 150–400)
RBC: 4.7 MIL/uL (ref 4.22–5.81)
RDW: 13.4 % (ref 11.5–15.5)
WBC Count: 6 10*3/uL (ref 4.0–10.5)
nRBC: 0 % (ref 0.0–0.2)

## 2020-03-16 NOTE — Patient Instructions (Signed)
Summary:  -There are two main categories of lung cancer, they are named based on the size of the cancer cell. One is called Non-Small cell lung cancer. The other type is Small Cell Lung Cancer -The sample (biopsy) that they took of your tumor was consistent with a subtype of Non-small cell lung cancer called Adenocarcinoma. This is the most common type of lung cancer.  -We covered a lot of important information at your appointment today regarding what the treatment plan is moving forward. Here are the the main points that were discussed at your office visit with [place] today:  -We are going to molecular testing to see if you are candidate for chemotherapy for a pill.  -There was not enough tissue from your procedure that Dr. Lamonte Sakai did. So we will arrange for you to get a blood test today to see if you are a candidate for a pill. We should have the results in 10-14 days.  -If that test is negative, then options chemotherapy/immunotherapy which is one day every 3 weeks.    Referrals or Imaging: -I will arrange for you to have a ultrasound of the thyroid to check the nodules in the thyroid. If it meets criteria for a biopsy, we will arrange to have to see them back for a biopsy.    Follow up:  -We will see you back for a follow up visit in 10-14 days to discuss the results of your tests.   -If you need to reach [place] at any time, the main office number to the cancer center is [phone], when you call, ask to speak to either [name]'s or Dr. Worthy Flank nurse.

## 2020-03-16 NOTE — Telephone Encounter (Signed)
Scheduled per los. Gave avs and calendar  

## 2020-03-16 NOTE — Progress Notes (Signed)
Nutrition Assessment:  64 year old male with newly diagnosed stage IV non-small cell lung cancer.  Planning SRS to the brain lesion on 8/26. Past medical history of HTN, stroke  Spoke with patient via phone.  Patient reports good appetite.  Eats breakfast every so often, sometimes cereal or eggs and bacon or grits.  Lunch is sandwich.  Supper is meat and vegetables, rice.  Thinks that portions of foods has decreased recently.    Medications: reviewed  Labs: glucose 108, creatinine 1.46  Anthropometrics:   Height: 73 inches Weight: 181 lb UBW: 195 lb noted in 03/2018 BMI: 23  7% weight loss in the last 2 years, concerning   NUTRITION DIAGNOSIS: Food and nutrition related knowledge deficit related to new diagnosis of cancer as questions regarding foods to eat.   INTERVENTION:  Encouraged good sources of protein (animal and plant sources).  Encouraged well-balanced diet with good appetite.   Encouraged weight maintenance during treatment. Contact information provided    MONITORING, EVALUATION, GOAL: weight trends, intake   NEXT VISIT: to be determined with treatment  Kay Ricciuti B. Zenia Resides, [place], [place] Registered Dietitian [phone] (mobile)

## 2020-03-17 ENCOUNTER — Encounter (HOSPITAL_COMMUNITY): Payer: Self-pay | Admitting: Radiology

## 2020-03-17 ENCOUNTER — Ambulatory Visit (INDEPENDENT_AMBULATORY_CARE_PROVIDER_SITE_OTHER): Payer: No Typology Code available for payment source | Admitting: Emergency Medicine

## 2020-03-17 ENCOUNTER — Encounter: Payer: Self-pay | Admitting: Emergency Medicine

## 2020-03-17 DIAGNOSIS — C3411 Malignant neoplasm of upper lobe, right bronchus or lung: Secondary | ICD-10-CM | POA: Diagnosis not present

## 2020-03-17 DIAGNOSIS — Z720 Tobacco use: Secondary | ICD-10-CM | POA: Diagnosis not present

## 2020-03-17 NOTE — Patient Instructions (Signed)
Please keep your follow-up appointments with radiation oncology (Dr. [name]) and medical oncology (Dr. Julien Nordmann) as planned.  Your next appointment is with radiation oncology at the cancer center tomorrow at 3:15 PM. Follow with Dr Lamonte Sakai if needed for any changes in your breathing or other respiratory symptoms.

## 2020-03-17 NOTE — Assessment & Plan Note (Signed)
Appears to be adenocarcinoma.  There was not enough biopsy material to get molecular studies so blood work has been sent to obtain.  Based on this they will tailor recommendations for systemic treatment.  He is scheduled for stereotactic radiation to the brain, first treatment tomorrow.  Please keep your follow-up appointments with radiation oncology (Dr. [name]) and medical oncology (Dr. Julien Nordmann) as planned.  Your next appointment is with radiation oncology at the cancer center tomorrow at 3:15 PM.

## 2020-03-17 NOTE — Assessment & Plan Note (Signed)
No dyspnea or clinical evidence for COPD.  I think we can defer pulmonary function testing or bronchodilators at this time.  Most important thing for him to do is to stop smoking.

## 2020-03-17 NOTE — Progress Notes (Signed)
Ugo Thoma Male, 64 y.o., September 07, 1955 MRN:  [id number] Phone:  [phone] Jerilynn Mages) PCP:  System, Pcp Not In Coverage:  [employer] Next Appt With Radiation Oncology 03/18/2020 at 3:30 PM  RE: [place] FNA BX Thyroid Received: Today Markus Daft, MD  Arlyn Leak for US guided FNA of left thyroid nodule. Needs diagnostic thyroid US before or at same time as biopsy. (Note to IR team - PET is on PACS 01/30/20   Anselm Pancoast       Previous Messages   ----- Message -----  From: Garth Bigness D  Sent: 03/16/2020  4:41 PM EDT  To: Ir Procedure Requests  Subject: [place] FNA BX Thyroid                 Procedure:  [place] FNA BX THYROID 1ST LESION [place]   Reason: Malignant neoplasm of upper lobe of right lung, Evaluate hypermetabolic thyroid lesion on PET scan to see if it meet criteria for biopsy   History: NM, CT, MR outside imaging in system, US Thyroid to be scheduled   Provider: Gracelyn Nurse   Provider Contact: [phone]

## 2020-03-17 NOTE — Progress Notes (Signed)
   Subjective:    Patient ID: Randy Andersen, male    DOB: Dec 25, 1955, 64 y.o.   MRN: [id number]  HPI 64 year old smoker (25 pack years) with a history of hypertension, followed at the [place].  He was found to have a posterior right upper lobe pulmonary nodule on CT scan of the chest.  A PET scan that was done on 01/30/2020 revealed that the nodule was hypermetabolic, also revealed some hypermetabolic mediastinal lymphadenopathy, hypermetabolic 1.5 cm inferior left thyroid nodule, 1.4 x 1 cm soft tissue nodule in the subcutaneous fat of the left axilla.  There are multiple small pulmonary nodules along the right major minor fissure new from 2017.  MRI Brain 01/23/20 >> 0.3cm enhancing nodule in L cerebral hemisphere.   Pt denies any complaints, he does have occasional cough, prod of tan sputum, no hemoptysis. No pain or SOB. No ataxia.   ROV 03/17/20 --follow-up visit for 64 year old man with a posterior right upper lobe pulmonary nodule and hypermetabolic mediastinal lymphadenopathy.  I met him in referral from the [place].  Unfortunately also had an MRI brain that showed metastatic disease.  He underwent bronchoscopy with navigation and endobronchial ultrasound 02/25/2020 that confirmed adenocarcinoma.  He is to receive brain radiation therapy, followed up with oncology yesterday, molecular studies on blood work are pending. He denies any dyspnea, CP or breathing difficulty. He is to begin XRT tomorrow.    Review of Systems As per HPI         Physical Exam  Vitals:   03/17/20 1038  BP: 140/80  Pulse: 66  Temp: 98 F (36.7 C)  SpO2: 99%  Weight: 183 lb (83 kg)  Height: _0  (1.854 m)   Gen: Pleasant, well-nourished, in no distress,  normal affect  ENT: No lesions,  mouth clear,  oropharynx clear, no postnasal drip  Neck: No JVD, no stridor  Lungs: No use of accessory muscles, no crackles or wheezing on normal respiration, no wheeze on forced expiration  Cardiovascular: RRR, heart sounds  normal, no murmur or gallops, no peripheral edema  Musculoskeletal: No deformities, no cyanosis or clubbing  Neuro: alert, awake, non focal  Skin: Warm, no lesions or rash      Assessment & Plan:  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  There was not enough biopsy material to get molecular studies so blood work has been sent to obtain.  Based on this they will tailor recommendations for systemic treatment.  He is scheduled for stereotactic radiation to the brain, first treatment tomorrow.  Please keep your follow-up appointments with radiation oncology (Dr. [name]) and medical oncology (Dr. Julien Nordmann) as planned.  Your next appointment is with radiation oncology at the cancer center tomorrow at 3:15 PM.  Tobacco use No dyspnea or clinical evidence for COPD.  I think we can defer pulmonary function testing or bronchodilators at this time.  Most important thing for him to do is to stop smoking.  Baltazar Apo, MD, PhD 03/17/2020, 10:53 AM [facility] [phone] or if no answer [phone]

## 2020-03-18 ENCOUNTER — Other Ambulatory Visit: Payer: Self-pay

## 2020-03-18 ENCOUNTER — Ambulatory Visit
Admission: RE | Admit: 2020-03-18 | Discharge: 2020-03-18 | Disposition: A | Discharge status: Home / Self Care | Payer: No Typology Code available for payment source | Source: Ambulatory Visit | Attending: Radiation Oncology | Admitting: Radiation Oncology

## 2020-03-18 ENCOUNTER — Encounter: Payer: Self-pay | Admitting: Radiation Oncology

## 2020-03-18 DIAGNOSIS — Z510 Encounter for antineoplastic radiation therapy: Secondary | ICD-10-CM | POA: Diagnosis not present

## 2020-03-18 NOTE — Op Note (Signed)
Name: Burlin Mcnair    MRN: [id number]   Date: 03/18/2020    DOB: 1956-07-04   STEREOTACTIC RADIOSURGERY OPERATIVE NOTE  PRE-OPERATIVE DIAGNOSIS:  Metastatic brain cancer  POST-OPERATIVE DIAGNOSIS:  Metastatic brain cancer  PROCEDURE:   1. Stereotactic Radiosurgery using TrueBeam Linac device 2. Stereotactic Radiosurgery using TrueBeam Linac device, additional lesion  SURGEON:  Duffy Rhody, MD  RADIATION ONCOLOGIST: Dr. Kyung Rudd, MD  TECHNIQUE:  The patient underwent a radiation treatment planning session in the radiation oncology simulation suite under the care of the radiation oncology physician and physicist.  I participated closely in the radiation treatment planning afterwards. The patient underwent planning CT which was fused to 3T high resolution MRI with 1 mm axial slices.  These images were fused on the planning system.  We contoured the two gross target volumes and subsequently expanded this by 1 mm to yield the Planning Target Volume. I actively participated in the planning process.  I helped to define and review the target contours and also the contours of the optic pathway, eyes, brainstem and selected nearby organs at risk.  All the dose constraints for critical structures were reviewed.  The prescription dose conformity was reviewed.  I approved the plan electronically.    Accordingly, Coden Franchi  was brought to the TrueBeam stereotactic radiation treatment linac and placed in the custom immobilization mask.  The patient was aligned according to the IR fiducial markers with BrainLab Exactrac, then orthogonal x-rays were used in ExacTrac with the 6DOF robotic table and the shifts were made to align the patient  Clarene Critchley received stereotactic radiosurgery to a prescription dose of 20 Gy uneventfully to both lesions in a single fraction.    The detailed description of the procedure is recorded in the radiation oncology procedure note.  I was present for the duration of the  procedure.  DISPOSITION:   Following delivery, the patient was transported to nursing in stable condition and monitored for possible acute effects to be discharged to home in stable condition with follow-up in one month.  Duffy Rhody, MD [facility]

## 2020-03-18 NOTE — Progress Notes (Signed)
Patient rested with [place] for 30 minutes following his SRS treatment.  Patient denies headache, dizziness, nausea, diplopia or ringing in the ears. Denies fatigue. Patient without complaints. Understands to avoid strenuous activity for the next 24 hours and call [phone] with needs.   BP (!) 141/91   Pulse 61   Temp 98 F (36.7 C)   Resp 16   SpO2 100%    Atari Novick M. Leonie Green, BSN

## 2020-03-19 ENCOUNTER — Encounter: Payer: Self-pay | Admitting: Radiation Oncology

## 2020-03-19 ENCOUNTER — Telehealth: Payer: Self-pay | Admitting: General Practice

## 2020-03-19 NOTE — Telephone Encounter (Signed)
[place] CSW Progress Notes  Call to patient to follow up on his financial needs.  He did not go to [employer] as requested to supply documentation they needed in order to complete his request for help getting his lights turned back on. States he went once but "it was too late." Stressed that he must do this as condition of continued assistance.  He has not contacted [employer] or investigated option or filing for his Social Security retirement benefits.  At this point, he only receives a small check from the [place].  CSW will continue to encourage him to take steps to obtain the assistance he needs.  Edwyna Shell, LCSW Clinical Social Worker Phone:  [phone] Cell:  [phone]

## 2020-03-19 NOTE — Progress Notes (Signed)
Called pt and left voicemail with [employer] information.  Left name and number and asked to return call with any questions.

## 2020-03-22 ENCOUNTER — Ambulatory Visit (HOSPITAL_COMMUNITY)
Admission: RE | Admit: 2020-03-22 | Discharge: 2020-03-22 | Disposition: A | Discharge status: Home / Self Care | Payer: No Typology Code available for payment source | Source: Ambulatory Visit | Attending: Physician Assistant | Admitting: Physician Assistant

## 2020-03-22 ENCOUNTER — Other Ambulatory Visit: Payer: Self-pay

## 2020-03-22 DIAGNOSIS — C3411 Malignant neoplasm of upper lobe, right bronchus or lung: Secondary | ICD-10-CM | POA: Diagnosis present

## 2020-03-22 IMAGING — US US THYROID
1 series · 13 of 25 positions shown · non-contrast
Comparison: CT of the chest on [DATE]

CLINICAL DATA: Incidental on CT.

EXAM:
THYROID ULTRASOUND
TECHNIQUE: Ultrasound examination of the thyroid gland and adjacent soft
tissues was performed.

[Series 1: us thyroid · 13 of 52 slices shown]
[im 1/52]
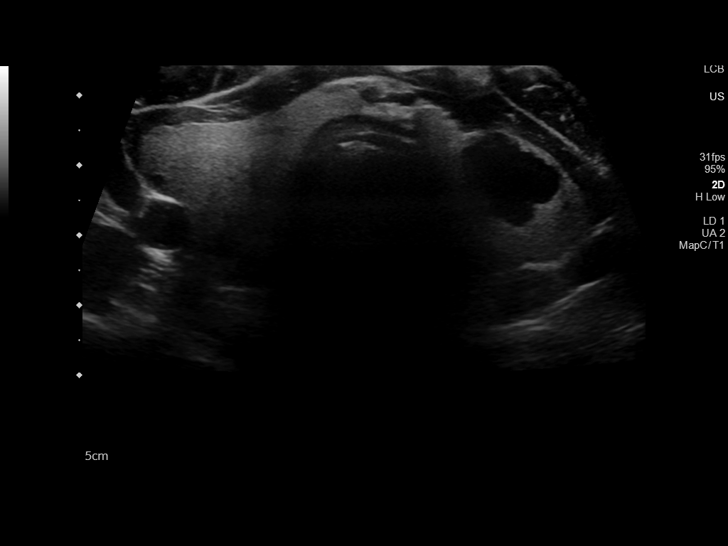
[im 5/52]
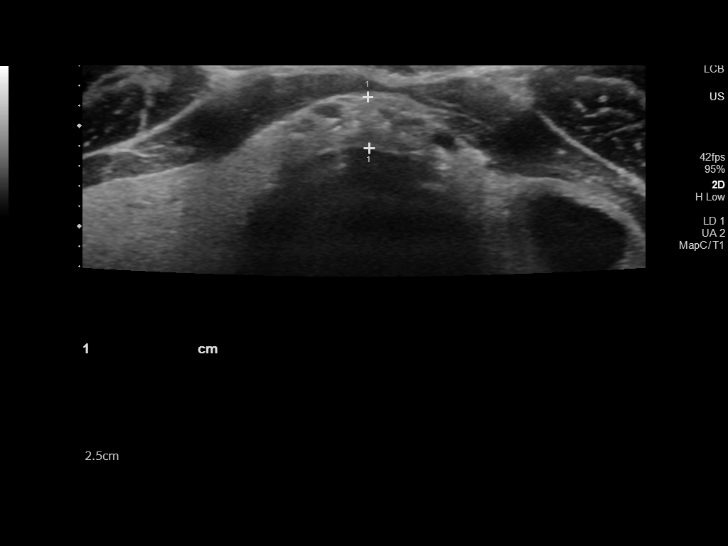
[im 9/52]
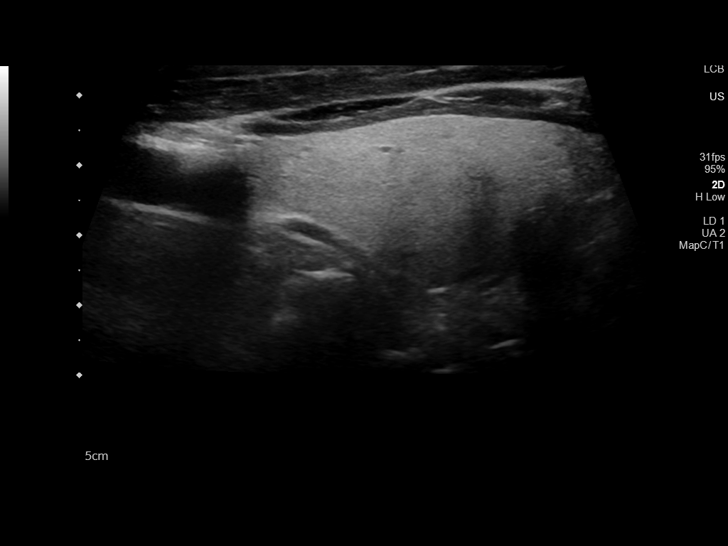
[im 13/52]
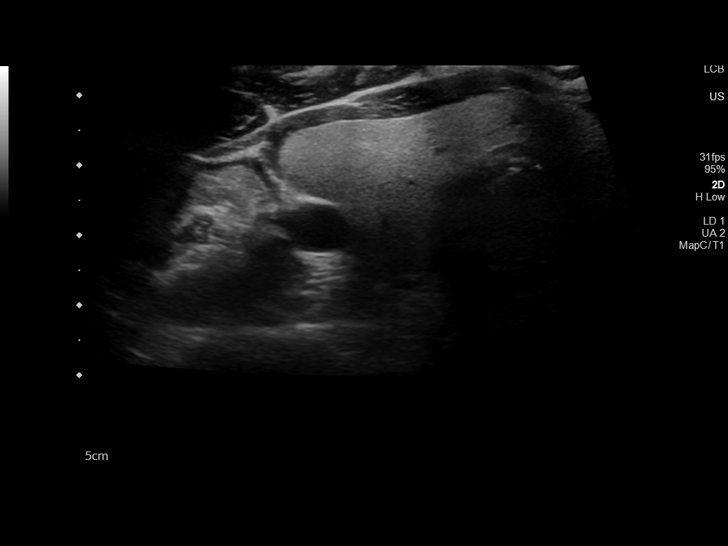
[im 18/52]
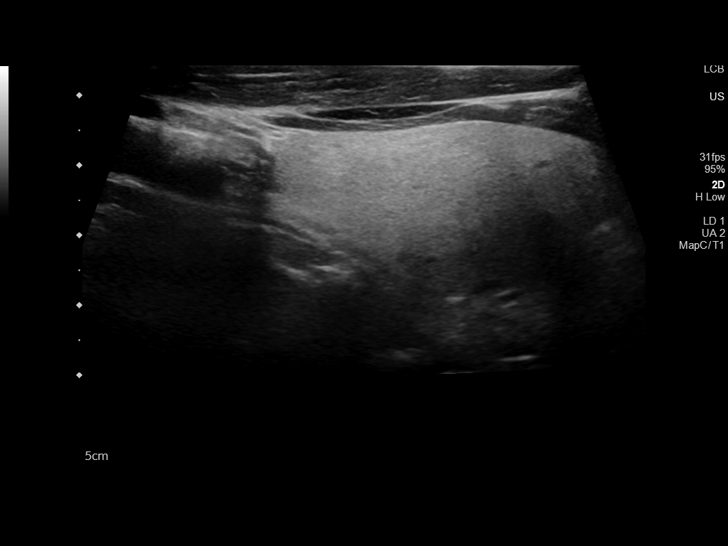
[im 22/52]
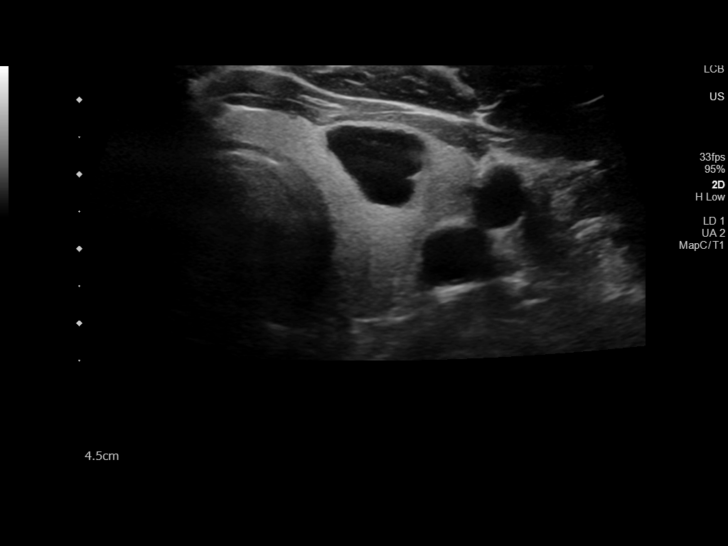
[im 26/52]
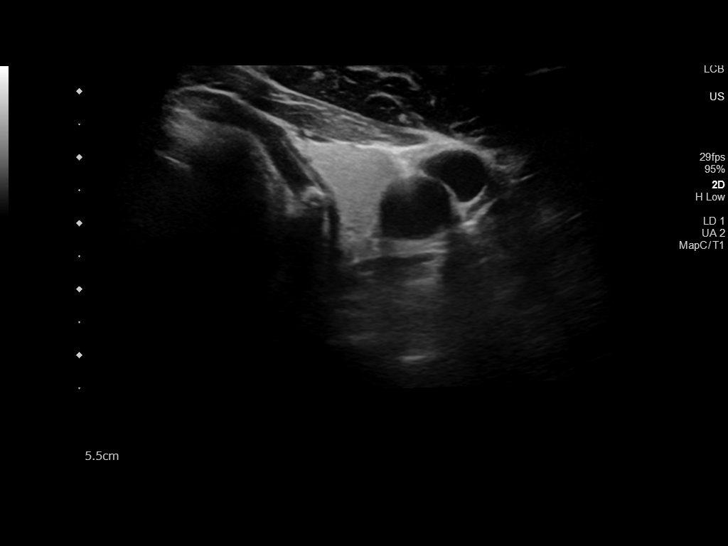
[im 30/52]
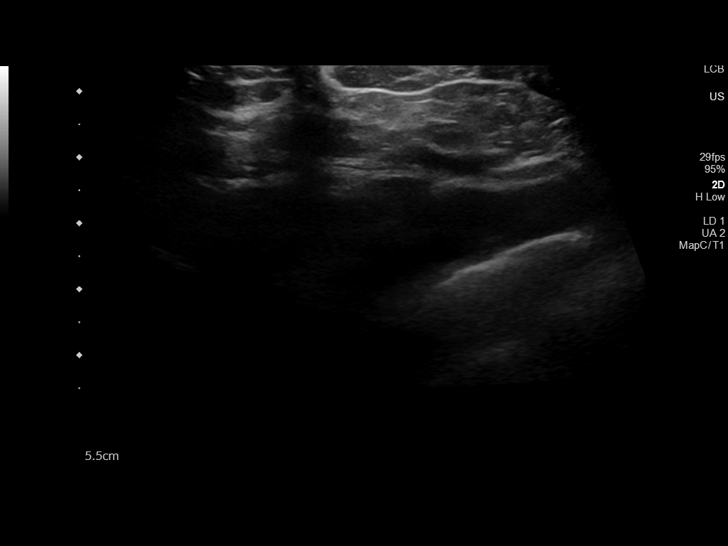
[im 35/52]
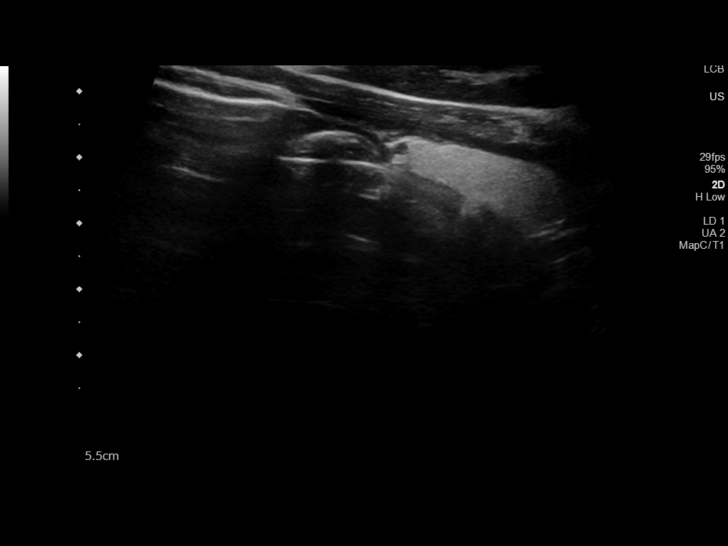
[im 39/52]
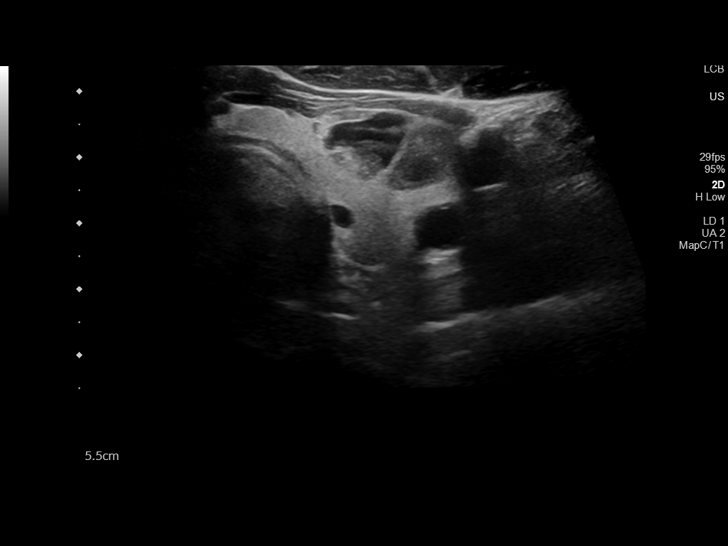
[im 43/52]
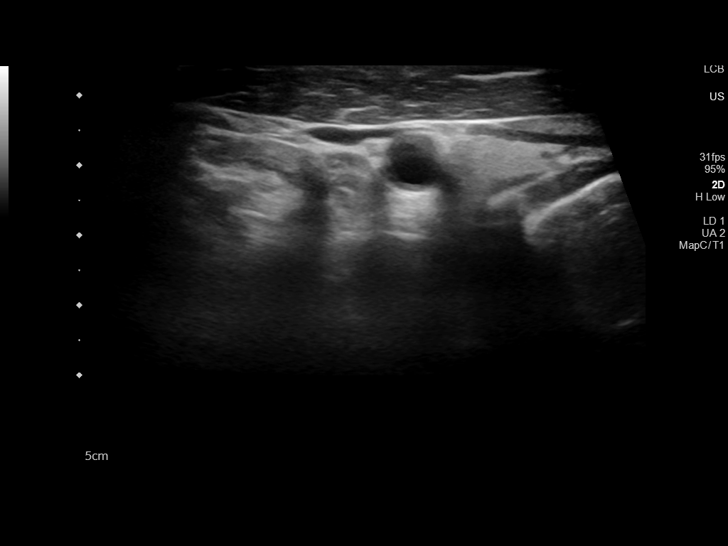
[im 47/52]
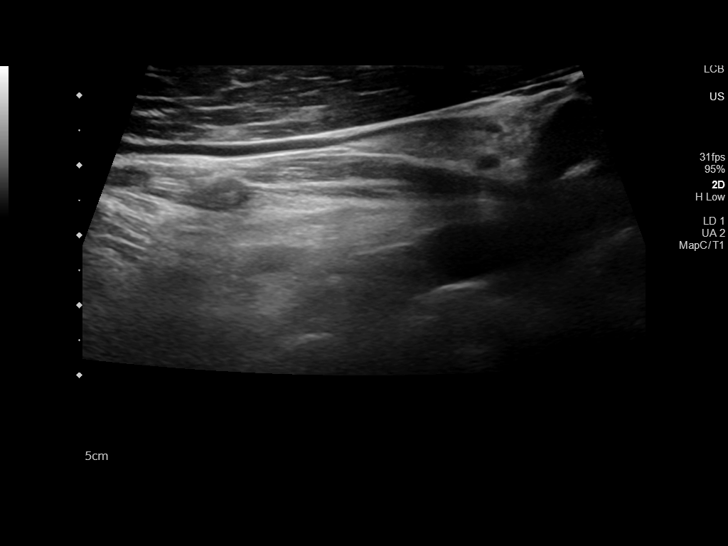
[im 52/52]
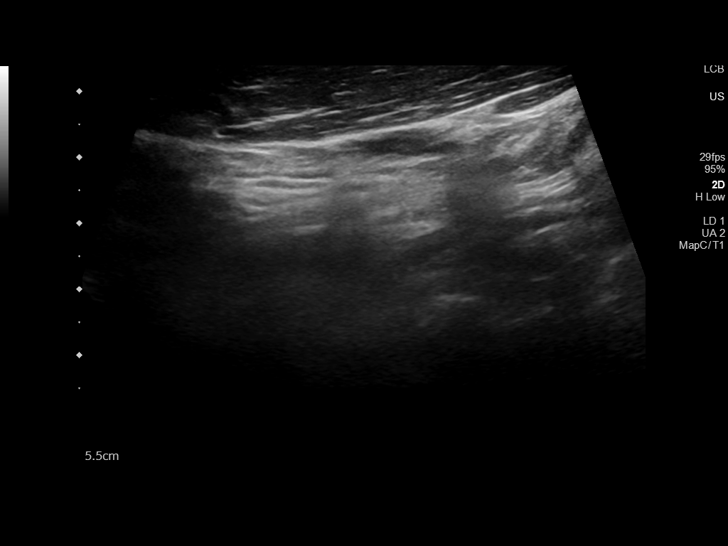

[13 of 25 positions shown; findings below may reference images not displayed]

FINDINGS: Parenchymal Echotexture: Mildly heterogenous

Isthmus: 0.5 cm

Right lobe: 5.5 x 2.4 x 1.9 cm

Left lobe: 5.3 x 2.6 x 2.1 cm

_________________________________________________________

Estimated total number of nodules >/= 1 cm: 1

Number of spongiform nodules >/=  2 cm not described below (TR1): 0

Number of mixed cystic and solid nodules >/= 1.5 cm not described
below (TR2): 0

_________________________________________________________

Nodule # 1:

Location: Left; Mid

Maximum size: 2.7 cm; Other 2 dimensions: 1.2 x 2.3 cm

Composition: mixed cystic and solid (1)

Echogenicity: isoechoic (1)

Shape: not taller-than-wide (0)

Margins: smooth (0)

Echogenic foci: none (0)

ACR TI-RADS total points: 2.

ACR TI-RADS risk category: TR2 (2 points).

ACR TI-RADS recommendations:

This nodule does NOT meet TI-RADS criteria for biopsy or dedicated
follow-up.

_________________________________________________________

No abnormal lymph nodes identified.
IMPRESSION: 2.7 cm mixed cystic and solid nodule in the left lobe of the thyroid
gland does not meet criteria for biopsy or dedicated follow-up.

The above is in keeping with the ACR TI-RADS recommendations - [HOSPITAL] [9K];[DATE].

## 2020-03-23 ENCOUNTER — Encounter: Payer: Self-pay | Admitting: *Deleted

## 2020-03-23 ENCOUNTER — Encounter: Payer: Self-pay | Admitting: Internal Medicine

## 2020-03-23 ENCOUNTER — Other Ambulatory Visit: Payer: Self-pay | Admitting: Radiology

## 2020-03-23 LAB — GUARDANT 360

## 2020-03-23 NOTE — Progress Notes (Signed)
[place] Clinical Social Work  Holiday representative contacted patient at home to confirm patient provided requested paper work to [employer].  Patient stated he turned his paperwork into [facility] on Friday 8/27, and his lights/power were now back on.     Johnnye Lana, MSW, LCSW, OSW-C Clinical Social Worker [facility] [phone]

## 2020-03-24 ENCOUNTER — Ambulatory Visit (HOSPITAL_COMMUNITY)
Admission: RE | Admit: 2020-03-24 | Discharge: 2020-03-24 | Disposition: A | Discharge status: Home / Self Care | Payer: No Typology Code available for payment source | Source: Ambulatory Visit | Attending: Physician Assistant | Admitting: Physician Assistant

## 2020-03-24 ENCOUNTER — Other Ambulatory Visit: Payer: Self-pay

## 2020-03-24 DIAGNOSIS — C3411 Malignant neoplasm of upper lobe, right bronchus or lung: Secondary | ICD-10-CM | POA: Diagnosis not present

## 2020-03-24 DIAGNOSIS — E041 Nontoxic single thyroid nodule: Secondary | ICD-10-CM | POA: Diagnosis present

## 2020-03-24 IMAGING — US US FNA BIOPSY THYROID 1ST LESION
1 series · 12 of 12 positions shown · non-contrast
Comparison: US thyroid [DATE]

MEDICATIONS:
1% lidocaine 3 mL

COMPLICATIONS:
None immediate.

INDICATION: Indeterminate left mid thyroid nodule

EXAM:
ULTRASOUND GUIDED FINE NEEDLE ASPIRATION OF INDETERMINATE THYROID
NODULE
TECHNIQUE: Informed written consent was obtained from the patient after a
discussion of the risks, benefits and alternatives to treatment.
Questions regarding the procedure were encouraged and answered. A
timeout was performed prior to the initiation of the procedure.

[Series 1: us fna bx thyroid 1st lesion afirma · 12 acquisitions, 12 frames shown]
[im 1/12]
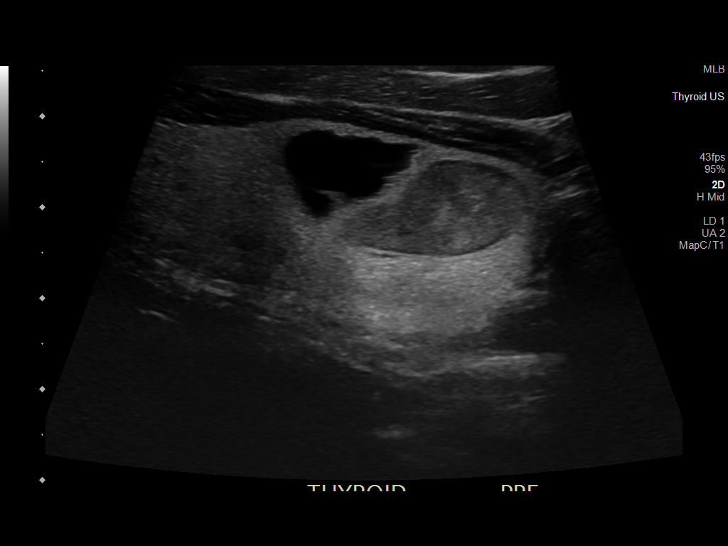
[im 2/12]
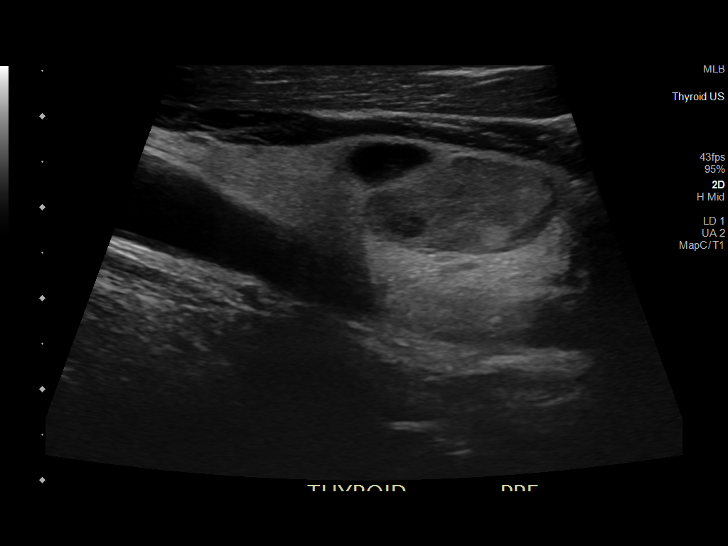
[im 3/12]
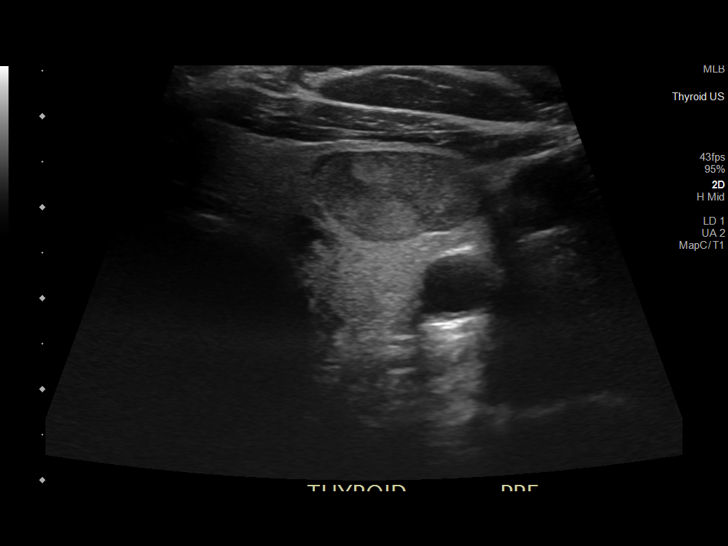
[im 4/12]
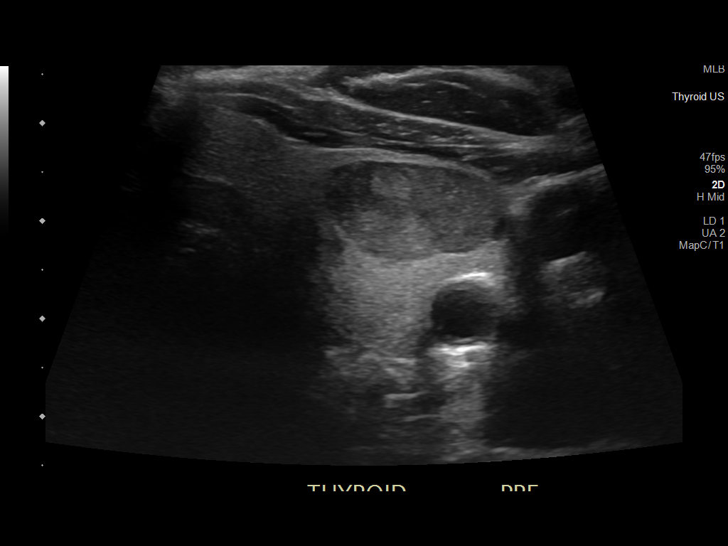
[im 5/12]
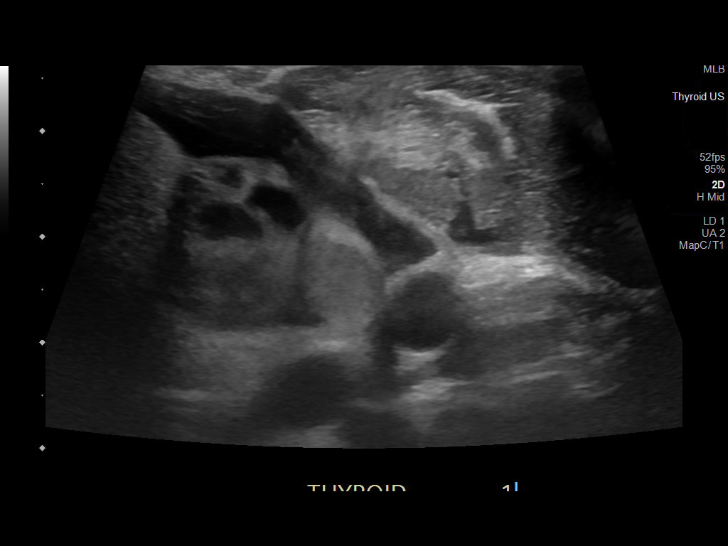
[im 6/12]
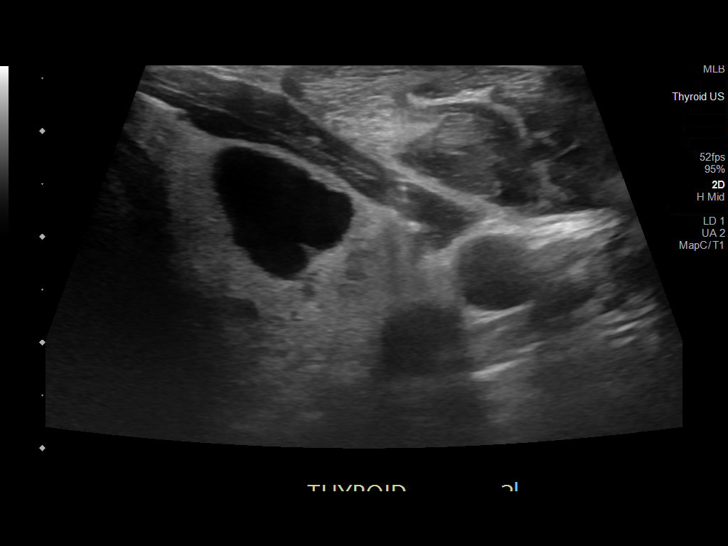
[im 7/12]
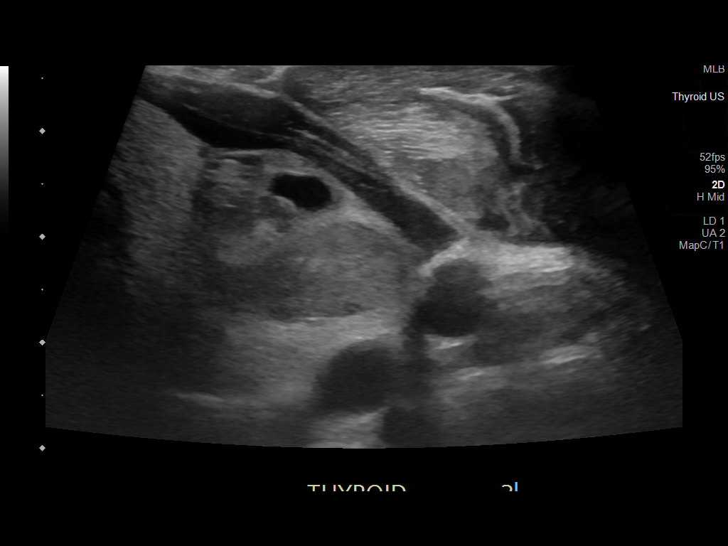
[im 8/12]
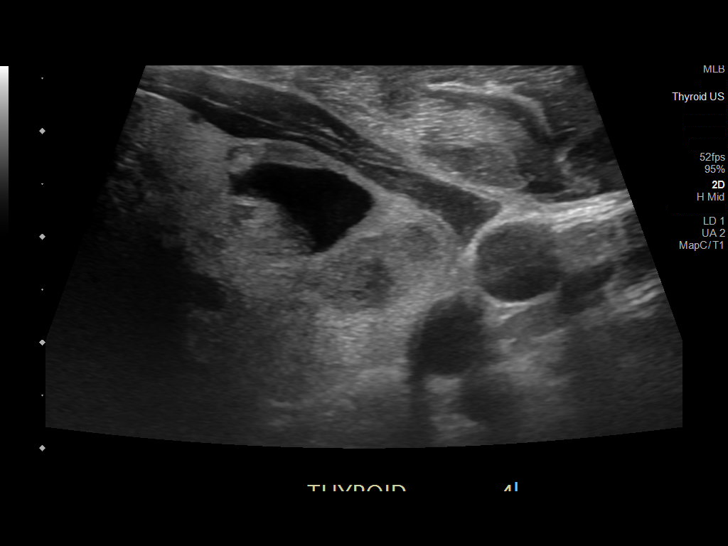
[im 9/12]
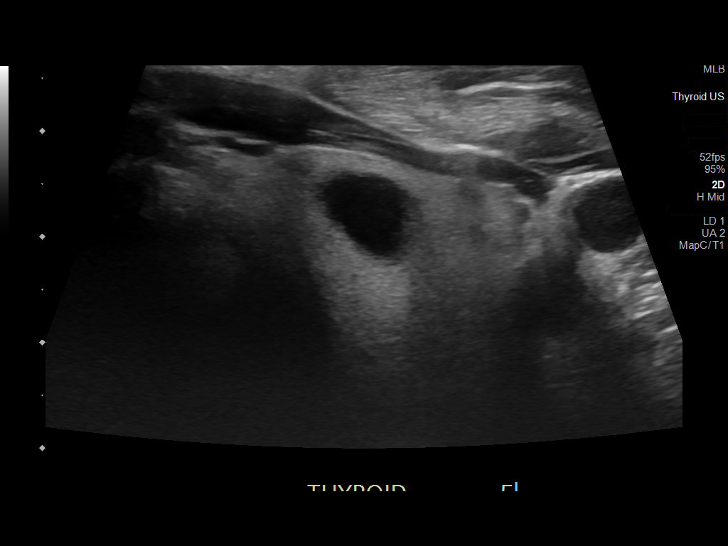
[im 10/12]
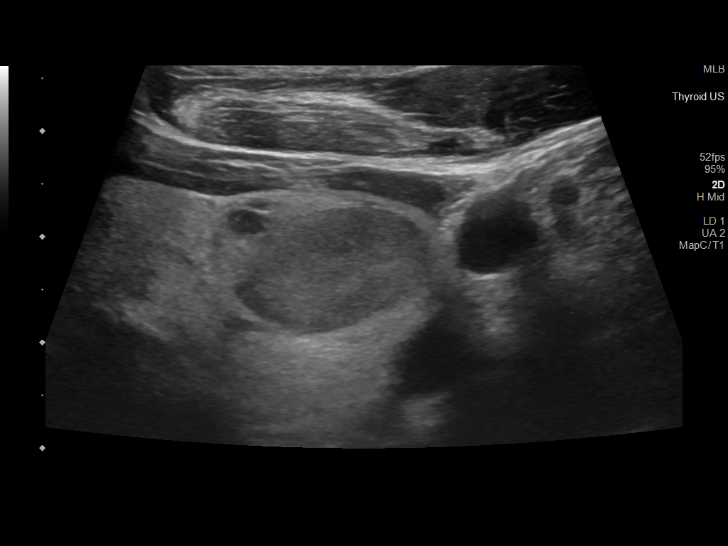
[im 11/12]
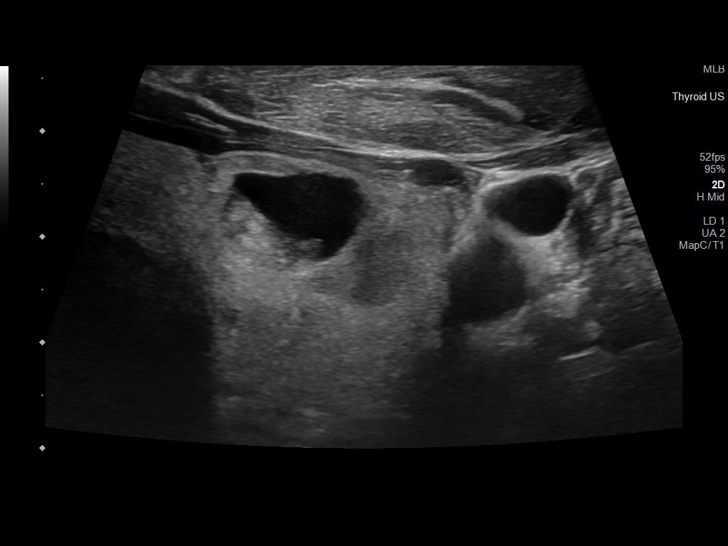
[im 12/12]
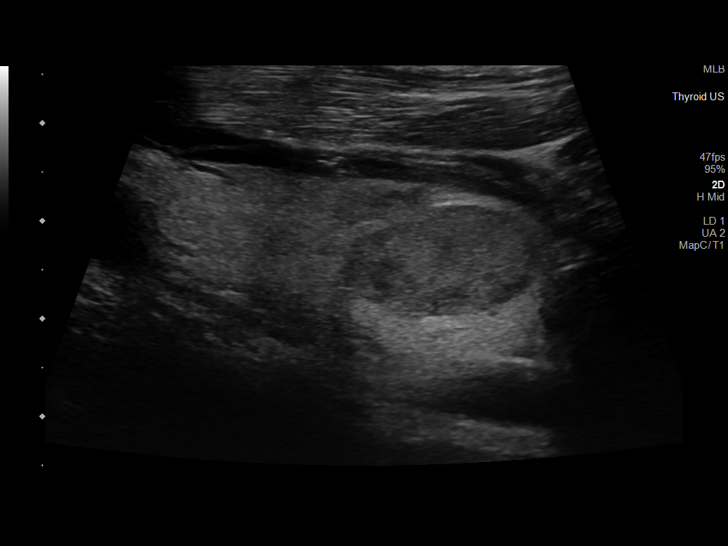

[12 of 12 positions shown; findings below may reference images not displayed]

Pre-procedural ultrasound scanning demonstrated unchanged size and
appearance of the indeterminate nodule within the left mid thyroid

The procedure was planned. The neck was prepped in the usual sterile
fashion, and a sterile drape was applied covering the operative
field. A timeout was performed prior to the initiation of the
procedure. Local anesthesia was provided with 1% lidocaine.

Under direct ultrasound guidance, 5 FNA biopsies were performed of
the left mid thyroid with a 25 gauge needle. Multiple ultrasound
images were saved for procedural documentation purposes. The samples
were prepared and submitted to pathology. Two of these samples were
prepared for Afirma testing.

Limited post procedural scanning was negative for hematoma or
additional complication. Dressings were placed. The patient
tolerated the above procedure well without immediate post-procedural
complication.
FINDINGS: Nodule reference number based on prior diagnostic ultrasound: 1

Maximum size:

Location: Left; Mid

ACR TI-RADS risk category: TR2 (2 points)

Reason for biopsy: patient/referrer request

Ultrasound imaging confirms appropriate placement of the needles
within the thyroid nodule.
IMPRESSION: Technically successful ultrasound guided fine needle aspiration of
left mid thyroid nodule. Read by: SAKO, NP

## 2020-03-24 MED ORDER — LIDOCAINE HCL (PF) 1 % IJ SOLN
INTRAMUSCULAR | Status: AC
  Filled 2020-03-24: qty 30

## 2020-03-24 NOTE — Progress Notes (Signed)
[place] OFFICE PROGRESS NOTE  System, Pcp Not In No address on file  DIAGNOSIS: Stage IV non-small cell lung cancer, favored to be adenocarcinoma.  He presented with posterior right upper lobe nodule as well as right hilar, infrahilar, subcarinal, and right paratracheal lymphadenopathy.  He also has a 1.4 cm x 1 cm soft tissue nodule in the skin/subcutaneous fat in the left axilla.  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  He was diagnosed in August 2021  Molecular Biomarkers: BIOMARKER(S) % CFDNA OR AMPLIFICATION ASSOCIATED FDA-APPROVED THERAPIES CLINICAL TRIAL AVAILABILITY [id number] 2.9% None  Yes [id number] 0.4% None  Yes [id number] 0.2% None  Yes  PRIOR THERAPY:  1) SRS to the small metastatic brain lesion under the care of Dr. [name] on 03/18/20  CURRENT THERAPY: Palliative systemic chemotherapy with carboplatin for an AUC of 5, Alimta 500 mg/m2, and Keytruda 200 mg IV every 3 weeks. First dose expected on 04/07/20  INTERVAL HISTORY: Randy Andersen 64 y.o. male returns to the clinic today for a follow visit. The patient was recently diagnosed with stage IV lung cancer. He completed SRS on 03/18/20 and tolerated it well. He also had a biopsy of the incidental thyroid nodule that was noted on PET scan which was malignant; however, there was not enough tissue to determine the histology. Today, he is feeling fairly well. He denies any recent fever, chills, or night sweats.  He lost approximately 10 pounds over the last 2 to 3 months. His sister is requesting a referral to a nutritionist. He denies any chest pain, shortness of breath, cough, or any hemoptysis.  He denies any nausea, vomiting, diarrhea, or constipation.  He denies any headache or visual changes. He has some chronic pain in his feet and is requesting a refill of Norco which it appears was prescribed by an outside provider. He is having some financial concerns and is in touch with the  social workers. He recently had molecular studies performed by [employer]. He is here for evaluation and for a more detailed discussion about his current condition and treatment options.    MEDICAL HISTORY: Past Medical History:  Diagnosis Date  . Asthma    as a child  . Chronic pain disorder    LT leg and foot  . Heart murmur    as a child  . Hypertension   . Malignant neoplasm of upper lobe of right lung ([place])   . Stroke [facility])    mini stroke - 2015, some tingling in fingers on right     ALLERGIES:  has No Known Allergies.  MEDICATIONS:  Current Outpatient Medications  Medication Sig Dispense Refill  . amLODipine (NORVASC) 10 MG tablet Take 10 mg by mouth daily.    Marland Kitchen doxylamine, Sleep, (UNISOM) 25 MG tablet Take 25 mg by mouth at bedtime as needed for sleep.     Marland Kitchen HYDROcodone-acetaminophen (NORCO) 10-325 MG tablet Take 1 tablet by mouth 2 (two) times daily.    . folic acid (FOLVITE) 1 MG tablet Take 1 tablet (1 mg total) by mouth daily. 30 tablet 2  . prochlorperazine (COMPAZINE) 10 MG tablet Take 1 tablet (10 mg total) by mouth every 6 (six) hours as needed. 30 tablet 2   No current facility-administered medications for this visit.    SURGICAL HISTORY:  Past Surgical History:  Procedure Laterality Date  . BUNIONECTOMY Left    had hammer toe surgery also and callus removed  . BUNIONECTOMY  Right    also had hammer toe surgery and callus removed  . LEG SURGERY Left    PT reports he has pins placed in his Lt leg  . ORIF TIBIA PLATEAU  10/09/2012   Dr Lorin Mercy  . ORIF TIBIA PLATEAU Left 10/08/2012   Procedure: OPEN REDUCTION INTERNAL FIXATION (ORIF) TIBIAL PLATEAU;  Surgeon: Marybelle Killings, MD;  Location: [place];  Service: Orthopedics;  Laterality: Left;  Marland Kitchen VIDEO BRONCHOSCOPY WITH ENDOBRONCHIAL NAVIGATION N/A 02/25/2020   Procedure: VIDEO BRONCHOSCOPY WITH ENDOBRONCHIAL NAVIGATION with biopsies;  Surgeon: Collene Gobble, MD;  Location: [place];  Service: Thoracic;  Laterality: N/A;   . VIDEO BRONCHOSCOPY WITH ENDOBRONCHIAL ULTRASOUND N/A 02/25/2020   Procedure: VIDEO BRONCHOSCOPY WITH ENDOBRONCHIAL ULTRASOUND;  Surgeon: Collene Gobble, MD;  Location: [facility];  Service: Thoracic;  Laterality: N/A;    REVIEW OF SYSTEMS:   Review of Systems  Constitutional: Negative for appetite change, chills, fatigue, fever and unexpected weight change.  HENT:   Negative for mouth sores, nosebleeds, sore throat and trouble swallowing.   Eyes: Negative for eye problems and icterus.  Respiratory: Negative for cough, hemoptysis, shortness of breath and wheezing.   Cardiovascular: Negative for chest pain and leg swelling.  Gastrointestinal: Negative for abdominal pain, constipation, diarrhea, nausea and vomiting.  Genitourinary: Negative for bladder incontinence, difficulty urinating, dysuria, frequency and hematuria.   Musculoskeletal: Negative for back pain, gait problem, neck pain and neck stiffness.  Skin: Negative for itching and rash.  Neurological: Negative for dizziness, extremity weakness, gait problem, headaches, light-headedness and seizures.  Hematological: Negative for adenopathy. Does not bruise/bleed easily.  Psychiatric/Behavioral: Negative for confusion, depression and sleep disturbance. The patient is not nervous/anxious.     PHYSICAL EXAMINATION:  Blood pressure 138/89, pulse 75, temperature 97.9 F (36.6 C), temperature source Tympanic, resp. rate 18, height 6\' 1"  (1.854 m), weight 181 lb 9.6 oz (82.4 kg), SpO2 100 %.  ECOG PERFORMANCE STATUS: 1 - Symptomatic but completely ambulatory  Physical Exam  Constitutional: Oriented to person, place, and time and well-developed, well-nourished, and in no distress.  HENT:  Head: Normocephalic and atraumatic.  Mouth/Throat: Oropharynx is clear and moist. No oropharyngeal exudate.  Eyes: Conjunctivae are normal. Right eye exhibits no discharge. Left eye exhibits no discharge. No scleral icterus.  Neck: Normal range of motion.  Neck supple.  Cardiovascular: Normal rate, regular rhythm, normal heart sounds and intact distal pulses.   Pulmonary/Chest: Effort normal and breath sounds normal. No respiratory distress. No wheezes. No rales.  Abdominal: Soft. Bowel sounds are normal. Exhibits no distension and no mass. There is no tenderness.  Musculoskeletal: Normal range of motion. Exhibits no edema.  Lymphadenopathy:    No cervical adenopathy.  Neurological: Alert and oriented to person, place, and time. Exhibits normal muscle tone. Gait normal. Coordination normal.  Skin: Skin is warm and dry. No rash noted. Not diaphoretic. No erythema. No pallor.  Psychiatric: Mood, memory and judgment normal.  Vitals reviewed.  LABORATORY DATA: Lab Results  Component Value Date   WBC 6.0 03/16/2020   HGB 13.4 03/16/2020   HCT 40.5 03/16/2020   MCV 86.2 03/16/2020   PLT 232 03/16/2020      Chemistry      Component Value Date/Time   NA 140 03/16/2020 1210   K 3.6 03/16/2020 1210   CL 105 03/16/2020 1210   CO2 26 03/16/2020 1210   BUN 11 03/16/2020 1210   CREATININE 1.46 (H) 03/16/2020 1210      Component Value  Date/Time   CALCIUM 9.9 03/16/2020 1210   ALKPHOS 115 03/16/2020 1210   AST 12 (L) 03/16/2020 1210   ALT 9 03/16/2020 1210   BILITOT 0.5 03/16/2020 1210       RADIOGRAPHIC STUDIES:  MR Brain W Wo Contrast  Result Date: 03/14/2020 CLINICAL DATA:  Metastatic lung carcinoma. Stereotactic radio surgery planning. EXAM: MRI HEAD WITHOUT AND WITH CONTRAST TECHNIQUE: Multiplanar, multiecho pulse sequences of the brain and surrounding structures were obtained without and with intravenous contrast. CONTRAST:  40mL GADAVIST GADOBUTROL 1 MMOL/ML IV SOLN COMPARISON:  None. FINDINGS: Brain: There are 2 contrast-enhancing lesions. One is along the left precentral gyrus and measures 4 mm (series 10, image 161). The other is at the medial aspect of the paramedian right parietal lobe (series 10, image 150) and measures 4 mm.  There is no acute infarct or hemorrhage. No midline shift or other mass effect. There are old small vessel infarcts of the basal ganglia bilaterally. Vascular: Normal flow voids. Skull and upper cervical spine: Normal marrow signal. Sinuses/Orbits: Negative. Other: None. IMPRESSION: Two contrast-enhancing metastatic lesions measuring up to 4 mm. Electronically Signed   By: Ulyses Jarred M.D.   On: 03/14/2020 03:26   [place] FNA BX THYROID 1ST LESION AFIRMA  Result Date: 03/24/2020 INDICATION: Indeterminate left mid thyroid nodule EXAM: ULTRASOUND GUIDED FINE NEEDLE ASPIRATION OF INDETERMINATE THYROID NODULE COMPARISON:  US thyroid 03/22/20 MEDICATIONS: 1% lidocaine 3 mL COMPLICATIONS: None immediate. TECHNIQUE: Informed written consent was obtained from the patient after a discussion of the risks, benefits and alternatives to treatment. Questions regarding the procedure were encouraged and answered. A timeout was performed prior to the initiation of the procedure. Pre-procedural ultrasound scanning demonstrated unchanged size and appearance of the indeterminate nodule within the left mid thyroid The procedure was planned. The neck was prepped in the usual sterile fashion, and a sterile drape was applied covering the operative field. A timeout was performed prior to the initiation of the procedure. Local anesthesia was provided with 1% lidocaine. Under direct ultrasound guidance, 5 FNA biopsies were performed of the left mid thyroid with a 25 gauge needle. Multiple ultrasound images were saved for procedural documentation purposes. The samples were prepared and submitted to pathology. Two of these samples were prepared for Afirma testing. Limited post procedural scanning was negative for hematoma or additional complication. Dressings were placed. The patient tolerated the above procedure well without immediate post-procedural complication. FINDINGS: Nodule reference number based on prior diagnostic ultrasound: 1 Maximum  size: 2.7 Location: Left; Mid ACR TI-RADS risk category: TR2 (2 points) Reason for biopsy: patient/referrer request Ultrasound imaging confirms appropriate placement of the needles within the thyroid nodule. IMPRESSION: Technically successful ultrasound guided fine needle aspiration of left mid thyroid nodule. Read by: Soyla Dryer, NP Electronically Signed   By: Aletta Edouard M.D.   On: 03/24/2020 14:23   US THYROID  Result Date: 03/22/2020 CLINICAL DATA:  Incidental on CT. EXAM: THYROID ULTRASOUND TECHNIQUE: Ultrasound examination of the thyroid gland and adjacent soft tissues was performed. COMPARISON:  CT of the chest on 02/23/2020 FINDINGS: Parenchymal Echotexture: Mildly heterogenous Isthmus: 0.5 cm Right lobe: 5.5 x 2.4 x 1.9 cm Left lobe: 5.3 x 2.6 x 2.1 cm _________________________________________________________ Estimated total number of nodules >/= 1 cm: 1 Number of spongiform nodules >/=  2 cm not described below (TR1): 0 Number of mixed cystic and solid nodules >/= 1.5 cm not described below (TR2): 0 _________________________________________________________ Nodule # 1: Location: Left; Mid Maximum size: 2.7 cm; Other  2 dimensions: 1.2 x 2.3 cm Composition: mixed cystic and solid (1) Echogenicity: isoechoic (1) Shape: not taller-than-wide (0) Margins: smooth (0) Echogenic foci: none (0) ACR TI-RADS total points: 2. ACR TI-RADS risk category: TR2 (2 points). ACR TI-RADS recommendations: This nodule does NOT meet TI-RADS criteria for biopsy or dedicated follow-up. _________________________________________________________ No abnormal lymph nodes identified. IMPRESSION: 2.7 cm mixed cystic and solid nodule in the left lobe of the thyroid gland does not meet criteria for biopsy or dedicated follow-up. The above is in keeping with the ACR TI-RADS recommendations - J Am Coll Radiol 2017;14:587-595. Electronically Signed   By: Aletta Edouard M.D.   On: 03/22/2020 08:43     ASSESSMENT/PLAN:  This is  a very pleasant 64 year old African-American male with stage IV non-small cell lung cancer, favored to be adenocarcinoma.  He presented with posterior right upper lobe nodule as well as right hilar, infrahilar, subcarinal, and right paratracheal lymphadenopathy.  He also has a 1.4 cm x 1 cm soft tissue nodule in the skin/subcutaneous fat in the left axilla.  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  He was diagnosed in August 2021.  Molecular studies by [employer] are negative for any actionable mutations.  The patient completed SRS to the small metastatic brain lesion under the care of Dr. [name] on 03/18/20  Dr. Julien Nordmann had a lengthly discussion with the patient today about his current condition and treatment options. The patient was given the option of a referral to hospice/palliative vs. Treatment with systemic chemotherapy with carboplatin for an AUC of 5, Alimta 500 mg/m, and Keytruda 200 mg IV every 3 weeks.  The patient is interested in proceeding with systemic chemotherapy.  He is expected to start her first dose of this treatment on 04/07/20.  We discussed the adverse side effects of treatment including but not limited to alopecia, myelosuppression, nausea and vomiting, peripheral neuropathy, liver or renal dysfunction as well as immunotherapy mediated adverse effects.   I will arrange for the patient to have a chemoeducation class prior to receiving his first cycle of chemotherapy.   We will arrange for the patient to have a B12 injection while in the clinic today.     I sent prescriptions for 1 mg folic acid p.o. daily as well as Compazine 10 mg every 6 hours as needed for nausea. He has some financial concerns related to picking up his medication. I have reached out to the financial resource specialist to see if he can get his medication through a grant at [employer].  The patient will follow-up in 2 weeks for a one-week follow-up visit after completing her first cycle  of chemotherapy.  We are working with the [facility] to get the referral to Dr. Harlow Asa, surgeon, approved so he can be evaluated for the thyroid neoplasm.   I have placed a referral to the nutritionist due to the patient's sister's request.   The patient is still smoking. I strongly encouraged him to quit smoking and recommended that he try the 1800-quit now line and to get some NRT with patches/gum/etc.   The patient requested a refill of his norco. I discussed with him we are happy to manage his pain related to his malignancy but we will not manage his chronic pain in his feet. I recommended he follow up with his PCP or the previous prescriber of the norco for consideration of a refill.   The patient was advised to call immediately if he has  any concerning symptoms in the interval. The patient voices understanding of current disease status and treatment options and is in agreement with the current care plan. All questions were answered. The patient knows to call the clinic with any problems, questions or concerns. We can certainly see the patient much sooner if necessary   Orders Placed This Encounter  Procedures  . CBC with Differential (Cancer Center Only)    Standing Status:   Standing    Number of Occurrences:   12    Standing Expiration Date:   03/31/2021  . CMP ([facility] only)    Standing Status:   Standing    Number of Occurrences:   12    Standing Expiration Date:   03/31/2021  . TSH    Standing Status:   Standing    Number of Occurrences:   18    Standing Expiration Date:   03/31/2021  . Ambulatory referral to Nutrition and Diabetic [facility]    Referral Priority:   Routine    Referral Type:   Consultation    Referral Reason:   Specialty Services Required    Number of Visits Requested:   [place], PA-C 03/31/20   ADDENDUM: Hematology/Oncology Attending: I had a face-to-face encounter with the patient today.  I recommended his care plan.  His sister was  available by phone during the visit.  This is a very pleasant 64 years old African-American male recently diagnosed with a stage IV non-small cell lung cancer, adenocarcinoma with no actionable mutations and presented with posterior right upper lobe nodule as well as right hilar, infrahilar, subcarinal and right paratracheal lymphadenopathy as well as 2 small brain metastasis and soft tissue nodule in the skin and subcutaneous fat in the left axilla.  The patient completed the SRS to the small brain metastasis under the care of Dr. [name] on 03/18/2020. He is here today for evaluation and discussion of his treatment options. I had a lengthy discussion with the patient and his sister about his current condition and treatment options.  They understand that he has incurable condition and all the treatment will be of palliative nature.  I discussed with the patient the option of palliative care versus palliative systemic chemotherapy with carboplatin for AUC of 5, Alimta 500 mg/M2 and Keytruda 200 mg IV every 3 weeks. I discussed with the patient the adverse effect of this treatment including but not limited to alopecia, myelosuppression, nausea and vomiting, peripheral neuropathy, liver or renal dysfunction as well as immunotherapy adverse effects. The patient is interested in proceeding with the systemic chemotherapy and he is expected to start the first dose of this treatment on 04/07/2020. We will arrange for the patient to have a chemotherapy education class before the first dose of his treatment. The patient will receive vitamin B12 injection today. We will call his pharmacy with prescription for Compazine 10 mg p.o. every 6 hours as needed for nausea in addition to folic acid 1 mg p.o. daily. For the suspicious malignancy of the thyroid, the patient will be referred to Dr. Harlow Asa for evaluation and consideration of surgical resection of the suspicious malignant nodule. The sister has a lot of questions and  we answered them completely to her satisfaction. He will come back for follow-up visit in 2 weeks for evaluation and management of any adverse effect of his treatment. The patient was advised to call immediately if he has any concerning symptoms in the interval.  Disclaimer: This  note was dictated with voice recognition software. Similar sounding words can inadvertently be transcribed and may be missed upon review. Eilleen Kempf, MD 03/31/20

## 2020-03-25 ENCOUNTER — Telehealth: Payer: Self-pay | Admitting: Physician Assistant

## 2020-03-25 ENCOUNTER — Other Ambulatory Visit: Payer: Self-pay | Admitting: Physician Assistant

## 2020-03-25 DIAGNOSIS — C73 Malignant neoplasm of thyroid gland: Secondary | ICD-10-CM

## 2020-03-25 LAB — CYTOLOGY - NON PAP

## 2020-03-25 NOTE — Telephone Encounter (Signed)
I called the patient to discuss the results of his thyroid biopsy. This looks highly suspicious for thyroid cancer. I have referred him to Dr. Harlow Asa and to be on the look out for a phone call from their office. We are seeing him for a follow up on 03/31/20 for his lung cancer. He expressed understanding.

## 2020-03-26 ENCOUNTER — Telehealth: Payer: Self-pay | Admitting: *Deleted

## 2020-03-26 ENCOUNTER — Other Ambulatory Visit: Payer: Self-pay | Admitting: *Deleted

## 2020-03-26 ENCOUNTER — Telehealth: Payer: Self-pay | Admitting: Internal Medicine

## 2020-03-26 NOTE — Telephone Encounter (Signed)
Release: [id number] **Outgoing Referral..Marland Kitchen[facility] fax [phone]

## 2020-03-26 NOTE — Telephone Encounter (Signed)
Received vm call from pt from yesterday asking what lung ca plan was.  Reached pt this pm & informed that [name] would see him on Wed to discuss plan & that we were working on getting referral to [facility] for appt with Dr. Harlow Asa. He expressed understanding.

## 2020-03-30 ENCOUNTER — Telehealth: Payer: Self-pay | Admitting: Medical Oncology

## 2020-03-30 ENCOUNTER — Other Ambulatory Visit: Payer: Self-pay | Admitting: Physician Assistant

## 2020-03-30 NOTE — Telephone Encounter (Signed)
Faxed request to community care at [facility] for authorization to see Dr Harlow Asa.

## 2020-03-31 ENCOUNTER — Encounter: Payer: Self-pay | Admitting: Physician Assistant

## 2020-03-31 ENCOUNTER — Inpatient Hospital Stay: Payer: No Typology Code available for payment source | Attending: Physician Assistant | Admitting: Physician Assistant

## 2020-03-31 ENCOUNTER — Other Ambulatory Visit: Payer: Self-pay

## 2020-03-31 ENCOUNTER — Other Ambulatory Visit: Payer: Self-pay | Admitting: Internal Medicine

## 2020-03-31 ENCOUNTER — Other Ambulatory Visit: Payer: Self-pay | Admitting: Physician Assistant

## 2020-03-31 VITALS — BP 138/89 | HR 75 | Temp 97.9°F | Resp 18 | Ht 73.0 in | Wt 181.6 lb

## 2020-03-31 DIAGNOSIS — I10 Essential (primary) hypertension: Secondary | ICD-10-CM | POA: Insufficient documentation

## 2020-03-31 DIAGNOSIS — C3411 Malignant neoplasm of upper lobe, right bronchus or lung: Secondary | ICD-10-CM | POA: Diagnosis present

## 2020-03-31 DIAGNOSIS — Z79899 Other long term (current) drug therapy: Secondary | ICD-10-CM | POA: Diagnosis not present

## 2020-03-31 DIAGNOSIS — Z7189 Other specified counseling: Secondary | ICD-10-CM

## 2020-03-31 DIAGNOSIS — Z5111 Encounter for antineoplastic chemotherapy: Secondary | ICD-10-CM | POA: Diagnosis present

## 2020-03-31 DIAGNOSIS — C7931 Secondary malignant neoplasm of brain: Secondary | ICD-10-CM | POA: Insufficient documentation

## 2020-03-31 DIAGNOSIS — Z8673 Personal history of transient ischemic attack (TIA), and cerebral infarction without residual deficits: Secondary | ICD-10-CM | POA: Insufficient documentation

## 2020-03-31 MED ORDER — CYANOCOBALAMIN 1000 MCG/ML IJ SOLN
1000.0000 ug | Freq: Once | INTRAMUSCULAR | Status: AC
  Administered 2020-03-31: 1000 ug via INTRAMUSCULAR

## 2020-03-31 MED ORDER — FOLIC ACID 1 MG PO TABS: 1.0000 mg | ORAL_TABLET | Freq: Every day | ORAL | 2 refills | Status: DC

## 2020-03-31 MED ORDER — PROCHLORPERAZINE MALEATE 10 MG PO TABS: 10.0000 mg | ORAL_TABLET | Freq: Four times a day (QID) | ORAL | 2 refills | Status: DC | PRN

## 2020-03-31 MED ORDER — CYANOCOBALAMIN 1000 MCG/ML IJ SOLN
INTRAMUSCULAR | Status: AC
  Filled 2020-03-31: qty 1

## 2020-03-31 MED FILL — FOLIC ACID 1 MG TABS: 1 | 30 days supply | Qty: 30 | Fill #0

## 2020-03-31 MED FILL — PROCHLORPERAZINE 10 MG TAB: 10 | 7 days supply | Qty: 30 | Fill #0

## 2020-03-31 NOTE — Patient Instructions (Addendum)
Summary:  -There are two main categories of lung cancer, they are named based on the size of the cancer cell. One is called Non-Small cell lung cancer. The other type is Small Cell Lung Cancer -The sample (biopsy) that they took of your tumor was consistent with a subtype of Non-small cell lung cancer called Adenocarcinoma. This is the most common type of lung cancer.  -We covered a lot of important information at your appointment today regarding what the treatment plan is moving forward. Here are the the main points that were discussed at your office visit with [place] today:  -The treatment that you will receive consists of two chemotherapy drugs, called Carboplatin and Alimta (also called Pemetrexed) and one immunotherapy drug called Keytruda (pembrolizumab).  -We are planning on starting your treatment next week on 04/07/20 but before your start your treatment, I would like you to attend a Chemotherapy Education Class. This involves having you sit down with one of our nurse educators. She will discuss with your one-on-one more details about your treatment as well as general information about resources here at the cancer center.  -Your treatment will be given once every 3 weeks. We will check your labs once a week for the first ~5 treatments just to make sure that important components of your blood are in an acceptable range -We will get a CT scan after 3 treatments to check on the progress of treatment  Medications:  -I have sent a few important medication prescriptions to your pharmacy.  -Compazine was sent to your pharmacy. This medication is for nausea. You may take this every 6 hours as needed if you feel nauseous.  -I have also sent a prescription for 1 mg of folic acid to your pharmacy. We need you to take 1 tablet every day.  -We will administer vitamin B12 every 9 weeks while you are here in the clinic. You have received your first dose today.   Referrals or Imaging: -We are [facility] getting  [facility] to Dr. Harlow Asa, the thyroid surgeon.    Follow up:  -We will see you back for a follow up visit 1 week after your first treatment to see how it went and help manage any side effects of treatment that you may have   -If you need to reach [place] at any time, the main office number to the cancer center is [phone], when you call, ask to speak to either [name]'s or Dr. Worthy Flank nurse.

## 2020-03-31 NOTE — Progress Notes (Signed)
Received referral from [facility] regarding patient having medication cost concerns.  Called patient to introduce myself as Arboriculturist and to offer available resources.   Discussed one-time $1000 Radio broadcast assistant to assist with personal expenses while going through treatment. Advised patient what is needed to apply. Asked if he can bring on 9/10 at appointment and he states he can.  Will meet him on 9/10 before chemo ed.

## 2020-03-31 NOTE — Progress Notes (Signed)
START ON PATHWAY REGIMEN - Non-Small Cell Lung     A cycle is every 21 days:     Pembrolizumab      Pemetrexed      Carboplatin   **Always confirm dose/schedule in your pharmacy ordering system**  Patient Characteristics: Stage IV Metastatic, Nonsquamous, Initial Chemotherapy/Immunotherapy, PS = 0, 1, ALK Rearrangement Negative and ROS1 Rearrangement Negative and NTRK Gene Fusion?Negative and RET Gene Fusion?Negative and EGFR Mutation Negative, PD-L1 Expression Positive  1-49% (TPS) / Negative / Not Tested / Awaiting Test Results and Immunotherapy Candidate Therapeutic Status: Stage IV Metastatic Histology: Nonsquamous Cell ROS1 Rearrangement Status: Negative Other Mutations/Biomarkers: No Other Actionable Mutations Chemotherapy/Immunotherapy LOT: Initial Chemotherapy/Immunotherapy Molecular Targeted Therapy: Not Appropriate KRAS G12C Mutation Status: Negative MET Exon 14 Mutation Status: Negative RET Gene Fusion Status: Negative EGFR Mutation Status: Negative/Wild Type NTRK Gene Fusion Status: Negative PD-L1 Expression Status: Quantity Not Sufficient ALK Rearrangement Status: Negative BRAF V600E Mutation Status: Negative ECOG Performance Status: 1 Biomarker Assessment Status Confirmation: All Genomic Markers Negative, or Only MET+ or BRAF+ or KRAS G12C+ Immunotherapy Candidate Status: Candidate for Immunotherapy Intent of Therapy: Non-Curative / Palliative Intent, Discussed with Patient

## 2020-04-01 ENCOUNTER — Telehealth: Payer: Self-pay | Admitting: Physician Assistant

## 2020-04-01 NOTE — Telephone Encounter (Signed)
Scheduled appointments per 9/8 los. Patient is aware of appointments date and times.

## 2020-04-01 NOTE — Progress Notes (Signed)
Pharmacist Chemotherapy Monitoring - Initial Assessment    Anticipated start date: 04/07/2020   Regimen:  . Are orders appropriate based on the patient's diagnosis, regimen, and cycle? Yes . Does the plan date match the patient's scheduled date? Yes . Is the sequencing of drugs appropriate? Yes . Are the premedications appropriate for the patient's regimen? Yes . Prior Authorization for treatment is: Pending o If applicable, is the correct biosimilar selected based on the patient's insurance? no  Organ Function and Labs: Marland Kitchen Are dose adjustments needed based on the patient's renal function, hepatic function, or hematologic function? Yes . Are appropriate labs ordered prior to the start of patient's treatment? Yes . Other organ system assessment, if indicated: N/A . The following baseline labs, if indicated, have been ordered: pembrolizumab: baseline TSH +/- T4  Dose Assessment: . Are the drug doses appropriate? Yes . Are the following correct: o Drug concentrations Yes o IV fluid compatible with drug Yes o Administration routes Yes o Timing of therapy Yes . If applicable, does the patient have documented access for treatment and/or plans for port-a-cath placement? no . If applicable, have lifetime cumulative doses been properly documented and assessed? yes Lifetime Dose Tracking  No doses have been documented on this patient for the following tracked chemicals: Doxorubicin, Epirubicin, Idarubicin, Daunorubicin, Mitoxantrone, Bleomycin, Oxaliplatin, Carboplatin, Liposomal Doxorubicin  o   Toxicity Monitoring/Prevention: . The patient has the following take home antiemetics prescribed: Ondansetron, Prochlorperazine and Dexamethasone . The patient has the following take home medications prescribed: B12 for pemetrexed and folic acid for pemetrexed . Medication allergies and previous infusion related reactions, if applicable, have been reviewed and addressed. No . The patient's current  medication list has been assessed for drug-drug interactions with their chemotherapy regimen. no significant drug-drug interactions were identified on review.  Order Review: . Are the treatment plan orders signed? Yes . Is the patient scheduled to see a provider prior to their treatment? Yes  I verify that I have reviewed each item in the above checklist and answered each question accordingly.  Marisal Swarey D 04/01/2020 2:07 PM

## 2020-04-02 ENCOUNTER — Encounter: Payer: Self-pay | Admitting: Internal Medicine

## 2020-04-02 ENCOUNTER — Telehealth: Payer: Self-pay | Admitting: *Deleted

## 2020-04-02 ENCOUNTER — Other Ambulatory Visit: Payer: Self-pay

## 2020-04-02 ENCOUNTER — Inpatient Hospital Stay: Payer: No Typology Code available for payment source

## 2020-04-02 NOTE — Progress Notes (Signed)
Met with patient at registration who brought proof of income for [employer].  Patient approved for one-time $1000 [employer] grant to assist with personal expenses while going through treatment. He has a copy of the approval letter and expense sheet along with the Outpatient pharmacy information. He received a gas card today from the grant.  Gave him my card for any additional financial questions or concerns.

## 2020-04-02 NOTE — Telephone Encounter (Signed)
Received vm call from [place] with [facility] requesting more infor regarding recent need for auth regarding Thyroid cancer.  Returned call & informed of need to see Dr Harlow Asa for thyroid surgery.  They were not aware of thyroid problem & requested notes.  Faxed last OV note with [place] report & fine needle aspiration report & cytology to [phone].  Fax failed & refaxed to [phone] which also failed.  Refaxed to [phone] & notified Abby that this did go through.

## 2020-04-06 ENCOUNTER — Telehealth: Payer: Self-pay | Admitting: Physician Assistant

## 2020-04-06 NOTE — Telephone Encounter (Signed)
Rescheduled appt per 9/13 sch msg - left message for patient with new appt date and time

## 2020-04-07 ENCOUNTER — Ambulatory Visit: Payer: Non-veteran care | Admitting: Physician Assistant

## 2020-04-07 ENCOUNTER — Other Ambulatory Visit: Payer: Self-pay

## 2020-04-07 ENCOUNTER — Other Ambulatory Visit: Payer: Non-veteran care

## 2020-04-07 ENCOUNTER — Inpatient Hospital Stay: Payer: No Typology Code available for payment source | Admitting: Physician Assistant

## 2020-04-07 ENCOUNTER — Inpatient Hospital Stay: Payer: No Typology Code available for payment source | Admitting: Nutrition

## 2020-04-07 ENCOUNTER — Inpatient Hospital Stay: Payer: No Typology Code available for payment source

## 2020-04-07 VITALS — BP 150/70 | HR 65 | Temp 98.6°F | Resp 18

## 2020-04-07 DIAGNOSIS — C3411 Malignant neoplasm of upper lobe, right bronchus or lung: Secondary | ICD-10-CM

## 2020-04-07 DIAGNOSIS — Z5111 Encounter for antineoplastic chemotherapy: Secondary | ICD-10-CM | POA: Diagnosis not present

## 2020-04-07 LAB — CBC WITH DIFFERENTIAL (CANCER CENTER ONLY)
Abs Immature Granulocytes: 0.02 10*3/uL (ref 0.00–0.07)
Basophils Absolute: 0.1 10*3/uL (ref 0.0–0.1)
Basophils Relative: 1 %
Eosinophils Absolute: 0.1 10*3/uL (ref 0.0–0.5)
Eosinophils Relative: 2 %
HCT: 41.4 % (ref 39.0–52.0)
Hemoglobin: 13.9 g/dL (ref 13.0–17.0)
Immature Granulocytes: 0 %
Lymphocytes Relative: 23 %
Lymphs Abs: 1.5 10*3/uL (ref 0.7–4.0)
MCH: 28 pg (ref 26.0–34.0)
MCHC: 33.6 g/dL (ref 30.0–36.0)
MCV: 83.5 fL (ref 80.0–100.0)
Monocytes Absolute: 0.7 10*3/uL (ref 0.1–1.0)
Monocytes Relative: 10 %
Neutro Abs: 4.3 10*3/uL (ref 1.7–7.7)
Neutrophils Relative %: 64 %
Platelet Count: 233 10*3/uL (ref 150–400)
RBC: 4.96 MIL/uL (ref 4.22–5.81)
RDW: 13.2 % (ref 11.5–15.5)
WBC Count: 6.7 10*3/uL (ref 4.0–10.5)
nRBC: 0 % (ref 0.0–0.2)

## 2020-04-07 LAB — CMP (CANCER CENTER ONLY)
ALT: 11 U/L (ref 0–44)
AST: 13 U/L — ABNORMAL LOW (ref 15–41)
Albumin: 3.9 g/dL (ref 3.5–5.0)
Alkaline Phosphatase: 125 U/L (ref 38–126)
Anion gap: 7 (ref 5–15)
BUN: 15 mg/dL (ref 8–23)
CO2: 25 mmol/L (ref 22–32)
Calcium: 9.6 mg/dL (ref 8.9–10.3)
Chloride: 107 mmol/L (ref 98–111)
Creatinine: 1.57 mg/dL — ABNORMAL HIGH (ref 0.61–1.24)
GFR, Est AFR Am: 53 mL/min — ABNORMAL LOW (ref >60)
GFR, Estimated: 46 mL/min — ABNORMAL LOW (ref >60)
Glucose, Bld: 104 mg/dL — ABNORMAL HIGH (ref 70–99)
Potassium: 3.8 mmol/L (ref 3.5–5.1)
Sodium: 139 mmol/L (ref 135–145)
Total Bilirubin: 0.4 mg/dL (ref 0.3–1.2)
Total Protein: 8 g/dL (ref 6.5–8.1)

## 2020-04-07 LAB — TSH: TSH: 1.34 u[IU]/mL (ref 0.320–4.118)

## 2020-04-07 MED ORDER — SODIUM CHLORIDE 0.9 % IV SOLN
150.0000 mg | Freq: Once | INTRAVENOUS | Status: AC
  Administered 2020-04-07: 150 mg via INTRAVENOUS
  Filled 2020-04-07: qty 150

## 2020-04-07 MED ORDER — SODIUM CHLORIDE 0.9 % IV SOLN
419.0000 mg | Freq: Once | INTRAVENOUS | Status: AC
  Administered 2020-04-07: 420 mg via INTRAVENOUS
  Filled 2020-04-07: qty 42

## 2020-04-07 MED ORDER — SODIUM CHLORIDE 0.9 % IV SOLN
Freq: Once | INTRAVENOUS | Status: AC
  Filled 2020-04-07: qty 250

## 2020-04-07 MED ORDER — SODIUM CHLORIDE 0.9 % IV SOLN
200.0000 mg | Freq: Once | INTRAVENOUS | Status: AC
  Administered 2020-04-07: 200 mg via INTRAVENOUS
  Filled 2020-04-07: qty 8

## 2020-04-07 MED ORDER — PALONOSETRON HCL INJECTION 0.25 MG/5ML
INTRAVENOUS | Status: AC
  Filled 2020-04-07: qty 5

## 2020-04-07 MED ORDER — SODIUM CHLORIDE 0.9 % IV SOLN
10.0000 mg | Freq: Once | INTRAVENOUS | Status: AC
  Administered 2020-04-07: 10 mg via INTRAVENOUS
  Filled 2020-04-07: qty 10

## 2020-04-07 MED ORDER — SODIUM CHLORIDE 0.9 % IV SOLN
500.0000 mg/m2 | Freq: Once | INTRAVENOUS | Status: AC
  Administered 2020-04-07: 1000 mg via INTRAVENOUS
  Filled 2020-04-07: qty 40

## 2020-04-07 MED ORDER — PALONOSETRON HCL INJECTION 0.25 MG/5ML
0.2500 mg | Freq: Once | INTRAVENOUS | Status: AC
  Administered 2020-04-07: 0.25 mg via INTRAVENOUS

## 2020-04-07 NOTE — Progress Notes (Signed)
Per Dr. Mohamed OK to treat with today's labs 

## 2020-04-07 NOTE — Patient Instructions (Signed)
[facility] Discharge Instructions for Patients Receiving Chemotherapy  Today you received the following chemotherapy agents Keytruda; Alimta; Carboplatin  To help prevent nausea and vomiting after your treatment, we encourage you to take your nausea medication as directed   If you develop nausea and vomiting that is not controlled by your nausea medication, call the clinic.   BELOW ARE SYMPTOMS THAT SHOULD BE REPORTED IMMEDIATELY:  *FEVER GREATER THAN 100.5 F  *CHILLS WITH OR WITHOUT FEVER  NAUSEA AND VOMITING THAT IS NOT CONTROLLED WITH YOUR NAUSEA MEDICATION  *UNUSUAL SHORTNESS OF BREATH  *UNUSUAL BRUISING OR BLEEDING  TENDERNESS IN MOUTH AND THROAT WITH OR WITHOUT PRESENCE OF ULCERS  *URINARY PROBLEMS  *BOWEL PROBLEMS  UNUSUAL RASH Items with * indicate a potential emergency and should be followed up as soon as possible.  Feel free to call the clinic should you have any questions or concerns. The clinic phone number is [phone].  Please show the [place] at check-in to the Emergency Department and triage nurse.  Pembrolizumab injection What is this medicine? PEMBROLIZUMAB (pem broe liz ue mab) is a monoclonal antibody. It is used to treat certain types of cancer. This medicine may be used for other purposes; ask your health care provider or pharmacist if you have questions. COMMON BRAND NAME(S): Keytruda What should I tell my health care provider before I take this medicine? They need to know if you have any of these conditions:  diabetes  immune system problems  inflammatory bowel disease  liver disease  lung or breathing disease  lupus  received or scheduled to receive an organ transplant or a stem-cell transplant that uses donor stem cells  an unusual or allergic reaction to pembrolizumab, other medicines, foods, dyes, or preservatives  pregnant or trying to get pregnant  breast-feeding How should I use this  medicine? This medicine is for infusion into a vein. It is given by a health care professional in a hospital or clinic setting. A special MedGuide will be given to you before each treatment. Be sure to read this information carefully each time. Talk to your pediatrician regarding the use of this medicine in children. While this drug may be prescribed for children as young as 6 months for selected conditions, precautions do apply. Overdosage: If you think you have taken too much of this medicine contact a poison control center or emergency room at once. NOTE: This medicine is only for you. Do not share this medicine with others. What if I miss a dose? It is important not to miss your dose. Call your doctor or health care professional if you are unable to keep an appointment. What may interact with this medicine? Interactions have not been studied. Give your health care provider a list of all the medicines, herbs, non-prescription drugs, or dietary supplements you use. Also tell them if you smoke, drink alcohol, or use illegal drugs. Some items may interact with your medicine. This list may not describe all possible interactions. Give your health care provider a list of all the medicines, herbs, non-prescription drugs, or dietary supplements you use. Also tell them if you smoke, drink alcohol, or use illegal drugs. Some items may interact with your medicine. What should I watch for while using this medicine? Your condition will be monitored carefully while you are receiving this medicine. You may need blood work done while you are taking this medicine. Do not become pregnant while taking this medicine or for 4 months after stopping it.  Women should inform their doctor if they wish to become pregnant or think they might be pregnant. There is a potential for serious side effects to an unborn child. Talk to your health care professional or pharmacist for more information. Do not breast-feed an infant while  taking this medicine or for 4 months after the last dose. What side effects may I notice from receiving this medicine? Side effects that you should report to your doctor or health care professional as soon as possible:  allergic reactions like skin rash, itching or hives, swelling of the face, lips, or tongue  bloody or black, tarry  breathing problems  changes in vision  chest pain  chills  confusion  constipation  cough  diarrhea  dizziness or feeling faint or lightheaded  fast or irregular heartbeat  fever  flushing  joint pain  low blood counts - this medicine may decrease the number of white blood cells, red blood cells and platelets. You may be at increased risk for infections and bleeding.  muscle pain  muscle weakness  pain, tingling, numbness in the hands or feet  persistent headache  redness, blistering, peeling or loosening of the skin, including inside the mouth  signs and symptoms of high blood sugar such as dizziness; dry mouth; dry skin; fruity breath; nausea; stomach pain; increased hunger or thirst; increased urination  signs and symptoms of kidney injury like trouble passing urine or change in the amount of urine  signs and symptoms of liver injury like dark urine, light-colored stools, loss of appetite, nausea, right upper belly pain, yellowing of the eyes or skin  sweating  swollen lymph nodes  weight loss Side effects that usually do not require medical attention (report to your doctor or health care professional if they continue or are bothersome):  decreased appetite  hair loss  muscle pain  tiredness This list may not describe all possible side effects. Call your doctor for medical advice about side effects. You may report side effects to FDA at 1-800-FDA-1088. Where should I keep my medicine? This drug is given in a hospital or clinic and will not be stored at home. NOTE: This sheet is a summary. It may not cover all  possible information. If you have questions about this medicine, talk to your doctor, pharmacist, or health care provider.  2020 Elsevier/Gold Standard (2019-05-16 18:07:58)  Pemetrexed injection What is this medicine? PEMETREXED (PEM e TREX ed) is a chemotherapy drug used to treat lung cancers like non-small cell lung cancer and mesothelioma. It may also be used to treat other cancers. This medicine may be used for other purposes; ask your health care provider or pharmacist if you have questions. COMMON BRAND NAME(S): Alimta What should I tell my health care provider before I take this medicine? They need to know if you have any of these conditions:  infection (especially a virus infection such as chickenpox, cold sores, or herpes)  kidney disease  low blood counts, like low white cell, platelet, or red cell counts  lung or breathing disease, like asthma  radiation therapy  an unusual or allergic reaction to pemetrexed, other medicines, foods, dyes, or preservative  pregnant or trying to get pregnant  breast-feeding How should I use this medicine? This drug is given as an infusion into a vein. It is administered in a hospital or clinic by a specially trained health care professional. Talk to your pediatrician regarding the use of this medicine in children. Special care may be needed. Overdosage: If  you think you have taken too much of this medicine contact a poison control center or emergency room at once. NOTE: This medicine is only for you. Do not share this medicine with others. What if I miss a dose? It is important not to miss your dose. Call your doctor or health care professional if you are unable to keep an appointment. What may interact with this medicine? This medicine may interact with the following medications:  Ibuprofen This list may not describe all possible interactions. Give your health care provider a list of all the medicines, herbs, non-prescription drugs,  or dietary supplements you use. Also tell them if you smoke, drink alcohol, or use illegal drugs. Some items may interact with your medicine. What should I watch for while using this medicine? Visit your doctor for checks on your progress. This drug may make you feel generally unwell. This is not uncommon, as chemotherapy can affect healthy cells as well as cancer cells. Report any side effects. Continue your course of treatment even though you feel ill unless your doctor tells you to stop. In some cases, you may be given additional medicines to help with side effects. Follow all directions for their use. Call your doctor or health care professional for advice if you get a fever, chills or sore throat, or other symptoms of a cold or flu. Do not treat yourself. This drug decreases your body's ability to fight infections. Try to avoid being around people who are sick. This medicine may increase your risk to bruise or bleed. Call your doctor or health care professional if you notice any unusual bleeding. Be careful brushing and flossing your teeth or using a toothpick because you may get an infection or bleed more easily. If you have any dental work done, tell your dentist you are receiving this medicine. Avoid taking products that contain aspirin, acetaminophen, ibuprofen, naproxen, or ketoprofen unless instructed by your doctor. These medicines may hide a fever. Call your doctor or health care professional if you get diarrhea or mouth sores. Do not treat yourself. To protect your kidneys, drink water or other fluids as directed while you are taking this medicine. Do not become pregnant while taking this medicine or for 6 months after stopping it. Women should inform their doctor if they wish to become pregnant or think they might be pregnant. Men should not father a child while taking this medicine and for 3 months after stopping it. This may interfere with the ability to father a child. You should talk to  your doctor or health care professional if you are concerned about your fertility. There is a potential for serious side effects to an unborn child. Talk to your health care professional or pharmacist for more information. Do not breast-feed an infant while taking this medicine or for 1 week after stopping it. What side effects may I notice from receiving this medicine? Side effects that you should report to your doctor or health care professional as soon as possible:  allergic reactions like skin rash, itching or hives, swelling of the face, lips, or tongue  breathing problems  redness, blistering, peeling or loosening of the skin, including inside the mouth  signs and symptoms of bleeding such as bloody or black, tarry stools; red or dark-brown urine; spitting up blood or brown material that looks like coffee grounds; red spots on the skin; unusual bruising or bleeding from the eye, gums, or nose  signs and symptoms of infection like fever or chills; cough;  sore throat; pain or trouble passing urine  signs and symptoms of kidney injury like trouble passing urine or change in the amount of urine  signs and symptoms of liver injury like dark yellow or brown urine; general ill feeling or flu-like symptoms; light-colored stools; loss of appetite; nausea; right upper belly pain; unusually weak or tired; yellowing of the eyes or skin Side effects that usually do not require medical attention (report to your doctor or health care professional if they continue or are bothersome):  constipation  mouth sores  nausea, vomiting  unusually weak or tired This list may not describe all possible side effects. Call your doctor for medical advice about side effects. You may report side effects to FDA at 1-800-FDA-1088. Where should I keep my medicine? This drug is given in a hospital or clinic and will not be stored at home. NOTE: This sheet is a summary. It may not cover all possible information. If  you have questions about this medicine, talk to your doctor, pharmacist, or health care provider.  2020 Elsevier/Gold Standard (2017-08-29 16:11:33)  Carboplatin injection What is this medicine? CARBOPLATIN (KAR boe pla tin) is a chemotherapy drug. It targets fast dividing cells, like cancer cells, and causes these cells to die. This medicine is used to treat ovarian cancer and many other cancers. This medicine may be used for other purposes; ask your health care provider or pharmacist if you have questions. COMMON BRAND NAME(S): Paraplatin What should I tell my health care provider before I take this medicine? They need to know if you have any of these conditions:  blood disorders  hearing problems  kidney disease  recent or ongoing radiation therapy  an unusual or allergic reaction to carboplatin, cisplatin, other chemotherapy, other medicines, foods, dyes, or preservatives  pregnant or trying to get pregnant  breast-feeding How should I use this medicine? This drug is usually given as an infusion into a vein. It is administered in a hospital or clinic by a specially trained health care professional. Talk to your pediatrician regarding the use of this medicine in children. Special care may be needed. Overdosage: If you think you have taken too much of this medicine contact a poison control center or emergency room at once. NOTE: This medicine is only for you. Do not share this medicine with others. What if I miss a dose? It is important not to miss a dose. Call your doctor or health care professional if you are unable to keep an appointment. What may interact with this medicine?  medicines for seizures  medicines to increase blood counts like filgrastim, pegfilgrastim, sargramostim  some antibiotics like amikacin, gentamicin, neomycin, streptomycin, tobramycin  vaccines Talk to your doctor or health care professional before taking any of these  medicines:  acetaminophen  aspirin  ibuprofen  ketoprofen  naproxen This list may not describe all possible interactions. Give your health care provider a list of all the medicines, herbs, non-prescription drugs, or dietary supplements you use. Also tell them if you smoke, drink alcohol, or use illegal drugs. Some items may interact with your medicine. What should I watch for while using this medicine? Your condition will be monitored carefully while you are receiving this medicine. You will need important blood work done while you are taking this medicine. This drug may make you feel generally unwell. This is not uncommon, as chemotherapy can affect healthy cells as well as cancer cells. Report any side effects. Continue your course of treatment even though you  feel ill unless your doctor tells you to stop. In some cases, you may be given additional medicines to help with side effects. Follow all directions for their use. Call your doctor or health care professional for advice if you get a fever, chills or sore throat, or other symptoms of a cold or flu. Do not treat yourself. This drug decreases your body's ability to fight infections. Try to avoid being around people who are sick. This medicine may increase your risk to bruise or bleed. Call your doctor or health care professional if you notice any unusual bleeding. Be careful brushing and flossing your teeth or using a toothpick because you may get an infection or bleed more easily. If you have any dental work done, tell your dentist you are receiving this medicine. Avoid taking products that contain aspirin, acetaminophen, ibuprofen, naproxen, or ketoprofen unless instructed by your doctor. These medicines may hide a fever. Do not become pregnant while taking this medicine. Women should inform their doctor if they wish to become pregnant or think they might be pregnant. There is a potential for serious side effects to an unborn child. Talk  to your health care professional or pharmacist for more information. Do not breast-feed an infant while taking this medicine. What side effects may I notice from receiving this medicine? Side effects that you should report to your doctor or health care professional as soon as possible:  allergic reactions like skin rash, itching or hives, swelling of the face, lips, or tongue  signs of infection - fever or chills, cough, sore throat, pain or difficulty passing urine  signs of decreased platelets or bleeding - bruising, pinpoint red spots on the skin, black, tarry stools, nosebleeds  signs of decreased red blood cells - unusually weak or tired, fainting spells, lightheadedness  breathing problems  changes in hearing  changes in vision  chest pain  high blood pressure  low blood counts - This drug may decrease the number of white blood cells, red blood cells and platelets. You may be at increased risk for infections and bleeding.  nausea and vomiting  pain, swelling, redness or irritation at the injection site  pain, tingling, numbness in the hands or feet  problems with balance, talking, walking  trouble passing urine or change in the amount of urine Side effects that usually do not require medical attention (report to your doctor or health care professional if they continue or are bothersome):  hair loss  loss of appetite  metallic taste in the mouth or changes in taste This list may not describe all possible side effects. Call your doctor for medical advice about side effects. You may report side effects to FDA at 1-800-FDA-1088. Where should I keep my medicine? This drug is given in a hospital or clinic and will not be stored at home. NOTE: This sheet is a summary. It may not cover all possible information. If you have questions about this medicine, talk to your doctor, pharmacist, or health care provider.  2020 Elsevier/Gold Standard (2007-10-15 14:38:05)

## 2020-04-07 NOTE — Progress Notes (Signed)
  Radiation Oncology         [phone] ________________________________  Name: Randy Andersen MRN: [id number]  Date: 03/18/2020  DOB: 1955/11/09  End of Treatment Note  Diagnosis:   Brain metastasis     Indication for treatment:  palliative       Radiation treatment dates:   03/18/20  Site/dose:    20 Gy in 1 fraction to 2 targets:  1.   ExacTrac , 3 dca beams max dose=125.8% PTV1 Rt Parietal 24mm  2.   ExacTrac, 3 DCA beams Max dose=126.8% PTV2 Lt Parietal 4mm   Narrative: The patient tolerated radiation treatment well.   There were no signs of acute toxicity after treatment.  Plan: The patient has completed radiation treatment. The patient will return to radiation oncology clinic for routine followup in one month. I advised the patient to call or return sooner if they have any questions or concerns related to their recovery or treatment. ________________________________  ------------------------------------------------  Jodelle Gross, MD, PhD

## 2020-04-07 NOTE — Progress Notes (Signed)
Nutrition follow-up completed with patient receiving treatment for stage IV non-small cell lung cancer. Weight stable and was documented as 181.6 pounds September 8. Patient states he feels he has lost muscle mass.  Nutrition diagnosis: Food and nutrition related knowledge deficit continues  Intervention: Reviewed high-protein foods and provided additional fact sheet. Enforced importance of continued weight maintenance. Recommended patient try oral nutrition supplements and provided samples and coupons. Questions were answered.  Teach back method used.  Contact information provided.  Monitoring, evaluation, goals: Patient will tolerate adequate calories and protein for weight maintenance.  Next visit: Wednesday, October 6 during infusion.  **Disclaimer: This note was dictated with voice recognition software. Similar sounding words can inadvertently be transcribed and this note may contain transcription errors which may not have been corrected upon publication of note.**

## 2020-04-08 ENCOUNTER — Telehealth: Payer: Self-pay | Admitting: *Deleted

## 2020-04-08 NOTE — Telephone Encounter (Addendum)
-----   Message from Priscille Loveless, RN sent at 04/07/2020  3:07 PM EDT ----- Regarding: First Chemo Follow-up Mohamed First Keytruda, Alimta, Carboplatin follow up  Talked with pt today & he reports doing OK & denies any problems.  He states he knows what to call us for & how to reach [place].

## 2020-04-08 NOTE — Telephone Encounter (Signed)
Attempted to call pt for f/u of treatment yest.  Mailbox full & unable to leave message.  Will try late.

## 2020-04-14 ENCOUNTER — Other Ambulatory Visit: Payer: Self-pay

## 2020-04-14 ENCOUNTER — Encounter: Payer: Self-pay | Admitting: Internal Medicine

## 2020-04-14 ENCOUNTER — Inpatient Hospital Stay: Payer: No Typology Code available for payment source

## 2020-04-14 ENCOUNTER — Inpatient Hospital Stay (HOSPITAL_BASED_OUTPATIENT_CLINIC_OR_DEPARTMENT_OTHER): Payer: No Typology Code available for payment source | Admitting: Internal Medicine

## 2020-04-14 VITALS — BP 132/86 | HR 83 | Temp 98.3°F | Resp 18 | Ht 73.0 in | Wt 180.9 lb

## 2020-04-14 DIAGNOSIS — C3411 Malignant neoplasm of upper lobe, right bronchus or lung: Secondary | ICD-10-CM

## 2020-04-14 DIAGNOSIS — Z5111 Encounter for antineoplastic chemotherapy: Secondary | ICD-10-CM | POA: Diagnosis not present

## 2020-04-14 DIAGNOSIS — Z5112 Encounter for antineoplastic immunotherapy: Secondary | ICD-10-CM

## 2020-04-14 LAB — CMP (CANCER CENTER ONLY)
ALT: 22 U/L (ref 0–44)
AST: 20 U/L (ref 15–41)
Albumin: 3.6 g/dL (ref 3.5–5.0)
Alkaline Phosphatase: 114 U/L (ref 38–126)
Anion gap: 5 (ref 5–15)
BUN: 22 mg/dL (ref 8–23)
CO2: 29 mmol/L (ref 22–32)
Calcium: 9.4 mg/dL (ref 8.9–10.3)
Chloride: 104 mmol/L (ref 98–111)
Creatinine: 1.4 mg/dL — ABNORMAL HIGH (ref 0.61–1.24)
GFR, Est AFR Am: >60 mL/min (ref >60)
GFR, Estimated: 52 mL/min — ABNORMAL LOW (ref >60)
Glucose, Bld: 111 mg/dL — ABNORMAL HIGH (ref 70–99)
Potassium: 4.2 mmol/L (ref 3.5–5.1)
Sodium: 138 mmol/L (ref 135–145)
Total Bilirubin: 0.7 mg/dL (ref 0.3–1.2)
Total Protein: 7.5 g/dL (ref 6.5–8.1)

## 2020-04-14 LAB — CBC WITH DIFFERENTIAL (CANCER CENTER ONLY)
Abs Immature Granulocytes: 0.01 10*3/uL (ref 0.00–0.07)
Basophils Absolute: 0 10*3/uL (ref 0.0–0.1)
Basophils Relative: 1 %
Eosinophils Absolute: 0.1 10*3/uL (ref 0.0–0.5)
Eosinophils Relative: 4 %
HCT: 38.9 % — ABNORMAL LOW (ref 39.0–52.0)
Hemoglobin: 12.7 g/dL — ABNORMAL LOW (ref 13.0–17.0)
Immature Granulocytes: 0 %
Lymphocytes Relative: 37 %
Lymphs Abs: 0.9 10*3/uL (ref 0.7–4.0)
MCH: 28.2 pg (ref 26.0–34.0)
MCHC: 32.6 g/dL (ref 30.0–36.0)
MCV: 86.4 fL (ref 80.0–100.0)
Monocytes Absolute: 0.2 10*3/uL (ref 0.1–1.0)
Monocytes Relative: 7 %
Neutro Abs: 1.2 10*3/uL — ABNORMAL LOW (ref 1.7–7.7)
Neutrophils Relative %: 51 %
Platelet Count: 150 10*3/uL (ref 150–400)
RBC: 4.5 MIL/uL (ref 4.22–5.81)
RDW: 13.2 % (ref 11.5–15.5)
WBC Count: 2.3 10*3/uL — ABNORMAL LOW (ref 4.0–10.5)
nRBC: 0 % (ref 0.0–0.2)

## 2020-04-14 NOTE — Progress Notes (Signed)
[place] [phone]   Fax:([phone]  OFFICE PROGRESS NOTE  Pcp, No No address on file  DIAGNOSIS: Stage IV non-small cell lung cancer, favored to be adenocarcinoma. He presented with posterior right upper lobe nodule as well as right hilar, infrahilar, subcarinal, and right paratracheal lymphadenopathy. He also has a 1.4 cm x 1 cm soft tissue nodule in the skin/subcutaneous fat in the left axilla. 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 He was diagnosed in August 2021  Molecular Biomarkers: BIOMARKER(S)         % CFDNA OR AMPLIFICATION       ASSOCIATED FDA-APPROVED THERAPIES        CLINICAL TRIAL AVAILABILITY [id number] 2.9% None    Yes [id number] 0.4% None    Yes [id number] 0.2% None    Yes  PRIOR THERAPY:  1) SRS to the small metastatic brain lesion under the care of Dr. [name] on 03/18/20  CURRENT THERAPY: Palliative systemic chemotherapy with carboplatin for an AUC of 5, Alimta 500 mg/m2, and Keytruda 200 mg IV every 3 weeks. First dose expected on 04/07/20  INTERVAL HISTORY: Randy Andersen 64 y.o. male returns to the clinic today for follow-up visit.  The patient is feeling fine today with no concerning complaints.  The patient tolerated the first week of his treatment fairly well with no concerning adverse effects.  He denied having any nausea, vomiting, diarrhea or constipation.  He has no fever or chills.  He has no headache or visual changes.  He was referred to Dr. Dot Lanes for evaluation of the thyroid nodule and he is expected to see him urgently October 2021.  He is here today for evaluation and repeat blood work.  MEDICAL HISTORY: Past Medical History:  Diagnosis Date  . Asthma    as a child  . Chronic pain disorder    LT leg and foot  . Heart murmur    as a child  . Hypertension   . Malignant neoplasm of upper lobe of right lung ([place])   . Stroke [facility])    mini stroke - 2015, some tingling in  fingers on right     ALLERGIES:  has No Known Allergies.  MEDICATIONS:  Current Outpatient Medications  Medication Sig Dispense Refill  . amLODipine (NORVASC) 10 MG tablet Take 10 mg by mouth daily.    Marland Kitchen doxylamine, Sleep, (UNISOM) 25 MG tablet Take 25 mg by mouth at bedtime as needed for sleep.     . folic acid (FOLVITE) 1 MG tablet Take 1 tablet (1 mg total) by mouth daily. 30 tablet 2  . HYDROcodone-acetaminophen (NORCO) 10-325 MG tablet Take 1 tablet by mouth 2 (two) times daily.    . prochlorperazine (COMPAZINE) 10 MG tablet Take 1 tablet (10 mg total) by mouth every 6 (six) hours as needed. 30 tablet 2   No current facility-administered medications for this visit.    SURGICAL HISTORY:  Past Surgical History:  Procedure Laterality Date  . BUNIONECTOMY Left    had hammer toe surgery also and callus removed  . BUNIONECTOMY Right    also had hammer toe surgery and callus removed  . LEG SURGERY Left    PT reports he has pins placed in his Lt leg  . ORIF TIBIA PLATEAU  10/09/2012   Dr Lorin Mercy  . ORIF TIBIA PLATEAU Left 10/08/2012   Procedure: OPEN REDUCTION INTERNAL FIXATION (ORIF) TIBIAL PLATEAU;  Surgeon: Marybelle Killings,  MD;  Location: [place];  Service: Orthopedics;  Laterality: Left;  Marland Kitchen VIDEO BRONCHOSCOPY WITH ENDOBRONCHIAL NAVIGATION N/A 02/25/2020   Procedure: VIDEO BRONCHOSCOPY WITH ENDOBRONCHIAL NAVIGATION with biopsies;  Surgeon: Collene Gobble, MD;  Location: [place];  Service: Thoracic;  Laterality: N/A;  . VIDEO BRONCHOSCOPY WITH ENDOBRONCHIAL ULTRASOUND N/A 02/25/2020   Procedure: VIDEO BRONCHOSCOPY WITH ENDOBRONCHIAL ULTRASOUND;  Surgeon: Collene Gobble, MD;  Location[facility];  Service: Thoracic;  Laterality: N/A;    REVIEW OF SYSTEMS:  A comprehensive review of systems was negative.   PHYSICAL EXAMINATION: General appearance: alert, cooperative and no distress Head: Normocephalic, without obvious abnormality, atraumatic Neck: no adenopathy, no JVD, supple, symmetrical,  trachea midline and thyroid not enlarged, symmetric, no tenderness/mass/nodules Lymph nodes: Cervical, supraclavicular, and axillary nodes normal. Resp: clear to auscultation bilaterally Back: symmetric, no curvature. ROM normal. No CVA tenderness. Cardio: regular rate and rhythm, S1, S2 normal, no murmur, click, rub or gallop GI: soft, non-tender; bowel sounds normal; no masses,  no organomegaly Extremities: extremities normal, atraumatic, no cyanosis or edema  ECOG PERFORMANCE STATUS: 1 - Symptomatic but completely ambulatory  Blood pressure 132/86, pulse 83, temperature 98.3 F (36.8 C), temperature source Tympanic, resp. rate 18, height 6\' 1"  (1.854 m), weight 180 lb 14.4 oz (82.1 kg), SpO2 100 %.  LABORATORY DATA: Lab Results  Component Value Date   WBC 2.3 (L) 04/14/2020   HGB 12.7 (L) 04/14/2020   HCT 38.9 (L) 04/14/2020   MCV 86.4 04/14/2020   PLT 150 04/14/2020      Chemistry      Component Value Date/Time   NA 139 04/07/2020 1119   K 3.8 04/07/2020 1119   CL 107 04/07/2020 1119   CO2 25 04/07/2020 1119   BUN 15 04/07/2020 1119   CREATININE 1.57 (H) 04/07/2020 1119      Component Value Date/Time   CALCIUM 9.6 04/07/2020 1119   ALKPHOS 125 04/07/2020 1119   AST 13 (L) 04/07/2020 1119   ALT 11 04/07/2020 1119   BILITOT 0.4 04/07/2020 1119       RADIOGRAPHIC STUDIES: [place] FNA BX THYROID 1ST LESION AFIRMA  Result Date: 03/24/2020 INDICATION: Indeterminate left mid thyroid nodule EXAM: ULTRASOUND GUIDED FINE NEEDLE ASPIRATION OF INDETERMINATE THYROID NODULE COMPARISON:  US thyroid 03/22/20 MEDICATIONS: 1% lidocaine 3 mL COMPLICATIONS: None immediate. TECHNIQUE: Informed written consent was obtained from the patient after a discussion of the risks, benefits and alternatives to treatment. Questions regarding the procedure were encouraged and answered. A timeout was performed prior to the initiation of the procedure. Pre-procedural ultrasound scanning demonstrated unchanged  size and appearance of the indeterminate nodule within the left mid thyroid The procedure was planned. The neck was prepped in the usual sterile fashion, and a sterile drape was applied covering the operative field. A timeout was performed prior to the initiation of the procedure. Local anesthesia was provided with 1% lidocaine. Under direct ultrasound guidance, 5 FNA biopsies were performed of the left mid thyroid with a 25 gauge needle. Multiple ultrasound images were saved for procedural documentation purposes. The samples were prepared and submitted to pathology. Two of these samples were prepared for Afirma testing. Limited post procedural scanning was negative for hematoma or additional complication. Dressings were placed. The patient tolerated the above procedure well without immediate post-procedural complication. FINDINGS: Nodule reference number based on prior diagnostic ultrasound: 1 Maximum size: 2.7 Location: Left; Mid ACR TI-RADS risk category: TR2 (2 points) Reason for biopsy: patient/referrer request Ultrasound imaging confirms appropriate placement  of the needles within the thyroid nodule. IMPRESSION: Technically successful ultrasound guided fine needle aspiration of left mid thyroid nodule. Read by: Soyla Dryer, NP Electronically Signed   By: Aletta Edouard M.D.   On: 03/24/2020 14:23   US THYROID  Result Date: 03/22/2020 CLINICAL DATA:  Incidental on CT. EXAM: THYROID ULTRASOUND TECHNIQUE: Ultrasound examination of the thyroid gland and adjacent soft tissues was performed. COMPARISON:  CT of the chest on 02/23/2020 FINDINGS: Parenchymal Echotexture: Mildly heterogenous Isthmus: 0.5 cm Right lobe: 5.5 x 2.4 x 1.9 cm Left lobe: 5.3 x 2.6 x 2.1 cm _________________________________________________________ Estimated total number of nodules >/= 1 cm: 1 Number of spongiform nodules >/=  2 cm not described below (TR1): 0 Number of mixed cystic and solid nodules >/= 1.5 cm not described below  (TR2): 0 _________________________________________________________ Nodule # 1: Location: Left; Mid Maximum size: 2.7 cm; Other 2 dimensions: 1.2 x 2.3 cm Composition: mixed cystic and solid (1) Echogenicity: isoechoic (1) Shape: not taller-than-wide (0) Margins: smooth (0) Echogenic foci: none (0) ACR TI-RADS total points: 2. ACR TI-RADS risk category: TR2 (2 points). ACR TI-RADS recommendations: This nodule does NOT meet TI-RADS criteria for biopsy or dedicated follow-up. _________________________________________________________ No abnormal lymph nodes identified. IMPRESSION: 2.7 cm mixed cystic and solid nodule in the left lobe of the thyroid gland does not meet criteria for biopsy or dedicated follow-up. The above is in keeping with the ACR TI-RADS recommendations - J Am Coll Radiol 2017;14:587-595. Electronically Signed   By: Aletta Edouard M.D.   On: 03/22/2020 08:43    ASSESSMENT AND PLAN: This is a very pleasant 64 years old African-American male diagnosed with stage IV non-small cell lung cancer, adenocarcinoma presented with right upper lobe nodule in addition to right hilar, infrahilar, subcarinal and right paratracheal adenopathy in addition to subcutaneous nodules in the left axilla and 2 small metastatic brain lesion diagnosed in August 2021.  The patient has no actionable mutations. He is currently undergoing systemic chemotherapy with carboplatin for AUC of 5, Alimta 500 mg/M2 and Keytruda 200 mg IV every 3 weeks.  Status post 1 cycle. The patient tolerated the first week of his treatment fairly well.  I recommended for him to come back for follow-up visit in 2 weeks with the start of cycle #2. He was advised to call immediately if he has any concerning symptoms in the interval. The patient voices understanding of current disease status and treatment options and is in agreement with the current care plan.  All questions were answered. The patient knows to call the clinic with any problems,  questions or concerns. We can certainly see the patient much sooner if necessary.   Disclaimer: This note was dictated with voice recognition software. Similar sounding words can inadvertently be transcribed and may not be corrected upon review.

## 2020-04-15 ENCOUNTER — Encounter: Payer: Self-pay | Admitting: Radiation Oncology

## 2020-04-15 ENCOUNTER — Telehealth: Payer: Self-pay | Admitting: Internal Medicine

## 2020-04-15 ENCOUNTER — Other Ambulatory Visit: Payer: Self-pay

## 2020-04-15 ENCOUNTER — Encounter: Payer: Self-pay | Admitting: General Practice

## 2020-04-15 NOTE — Progress Notes (Signed)
[place] Psychosocial Distress Screening Clinical Social Work  Clinical Social Work was referred by distress screening protocol.  The patient scored a 8 on the Psychosocial Distress Thermometer which indicates moderate distress. Clinical Social Worker contacted patient by phone to assess for distress and other psychosocial needs. Unable to reach patient, left VM w encouragement to call me back to discuss his needs w transportation.  Checked w transportation services, he is not enrolled.  Will refer to Transportation today.     ONCBCN DISTRESS SCREENING 04/15/2020  Screening Type Initial Screening  Distress experienced in past week (1-10) 8  Practical problem type Transportation  Emotional problem type   Other    Clinical Social Worker follow up needed: Yes.    If yes, follow up plan:  Scheduled to see patient in infusion 10/6, sent referral to [employer]  Beverely Pace, [place], LCSW Clinical Social Worker Phone:  [phone]

## 2020-04-15 NOTE — Telephone Encounter (Signed)
Scheduled per los. Called and left msg. Mailed printout  °

## 2020-04-19 ENCOUNTER — Other Ambulatory Visit: Payer: Self-pay

## 2020-04-19 ENCOUNTER — Ambulatory Visit
Admission: RE | Admit: 2020-04-19 | Discharge: 2020-04-19 | Disposition: A | Discharge status: Home / Self Care | Payer: Non-veteran care | Source: Ambulatory Visit | Attending: Radiation Oncology | Admitting: Radiation Oncology

## 2020-04-19 DIAGNOSIS — C3411 Malignant neoplasm of upper lobe, right bronchus or lung: Secondary | ICD-10-CM

## 2020-04-19 NOTE — Progress Notes (Signed)
  Radiation Oncology         [phone] ________________________________  Name: Randy Andersen MRN: [id number]  Date of Service: 04/19/2020  DOB: March 31, 1956  Post Treatment Telephone Note  Diagnosis:  Stage IV, [id number], NSCLC, adenocarcinoma of the RUL with brain metastasis.  Interval Since Last Radiation:  5 weeks   03/18/20 SRS Treatment:   20 Gy in 1 fraction to 2 targets: PTV1 Rt Parietal 6 mm PTV2 Lt Parietal 6 mm  Narrative:  The patient was contacted today for routine follow-up. During treatment he did very well with radiotherapy and did not have significant desquamation. He reports he is doing well overall. He's been tolerating his chemo/immunotherapy regimen well so far. He denies any concerns with headaches, fatigue, or nausea. No other complaints are verbalized.  Impression/Plan: 1. 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 The patient has been doing well since completion of radiotherapy. We discussed that we would plan to follow up with MRI scan in November to begin his surveaillance. He will also continue to follow up with Dr. Julien Nordmann in medical oncology as he continues systemic therapy.    Carola Rhine, PAC

## 2020-04-19 NOTE — Addendum Note (Signed)
Encounter addended by: Hayden Pedro, PA-C on: 04/19/2020 1:08 PM  Actions taken: Level of Service modified

## 2020-04-21 ENCOUNTER — Inpatient Hospital Stay: Payer: No Typology Code available for payment source

## 2020-04-21 ENCOUNTER — Other Ambulatory Visit: Payer: Self-pay

## 2020-04-21 DIAGNOSIS — Z5111 Encounter for antineoplastic chemotherapy: Secondary | ICD-10-CM | POA: Diagnosis not present

## 2020-04-21 DIAGNOSIS — C3411 Malignant neoplasm of upper lobe, right bronchus or lung: Secondary | ICD-10-CM

## 2020-04-21 LAB — CBC WITH DIFFERENTIAL (CANCER CENTER ONLY)
Abs Immature Granulocytes: 0.01 10*3/uL (ref 0.00–0.07)
Basophils Absolute: 0 10*3/uL (ref 0.0–0.1)
Basophils Relative: 0 %
Eosinophils Absolute: 0 10*3/uL (ref 0.0–0.5)
Eosinophils Relative: 1 %
HCT: 40 % (ref 39.0–52.0)
Hemoglobin: 13.2 g/dL (ref 13.0–17.0)
Immature Granulocytes: 0 %
Lymphocytes Relative: 35 %
Lymphs Abs: 1.2 10*3/uL (ref 0.7–4.0)
MCH: 28 pg (ref 26.0–34.0)
MCHC: 33 g/dL (ref 30.0–36.0)
MCV: 84.7 fL (ref 80.0–100.0)
Monocytes Absolute: 0.4 10*3/uL (ref 0.1–1.0)
Monocytes Relative: 12 %
Neutro Abs: 1.8 10*3/uL (ref 1.7–7.7)
Neutrophils Relative %: 52 %
Platelet Count: 196 10*3/uL (ref 150–400)
RBC: 4.72 MIL/uL (ref 4.22–5.81)
RDW: 13.3 % (ref 11.5–15.5)
WBC Count: 3.4 10*3/uL — ABNORMAL LOW (ref 4.0–10.5)
nRBC: 0 % (ref 0.0–0.2)

## 2020-04-21 LAB — CMP (CANCER CENTER ONLY)
ALT: 30 U/L (ref 0–44)
AST: 19 U/L (ref 15–41)
Albumin: 3.9 g/dL (ref 3.5–5.0)
Alkaline Phosphatase: 120 U/L (ref 38–126)
Anion gap: 8 (ref 5–15)
BUN: 20 mg/dL (ref 8–23)
CO2: 27 mmol/L (ref 22–32)
Calcium: 9.5 mg/dL (ref 8.9–10.3)
Chloride: 103 mmol/L (ref 98–111)
Creatinine: 1.58 mg/dL — ABNORMAL HIGH (ref 0.61–1.24)
GFR, Est AFR Am: 52 mL/min — ABNORMAL LOW (ref >60)
GFR, Estimated: 45 mL/min — ABNORMAL LOW (ref >60)
Glucose, Bld: 137 mg/dL — ABNORMAL HIGH (ref 70–99)
Potassium: 4.3 mmol/L (ref 3.5–5.1)
Sodium: 138 mmol/L (ref 135–145)
Total Bilirubin: 0.3 mg/dL (ref 0.3–1.2)
Total Protein: 7.6 g/dL (ref 6.5–8.1)

## 2020-04-26 NOTE — Progress Notes (Signed)
[place] OFFICE PROGRESS NOTE  Pcp, No No address on file  DIAGNOSIS: Stage IV non-small cell lung cancer, favored to be adenocarcinoma. He presented with posterior right upper lobe nodule as well as right hilar, infrahilar, subcarinal, and right paratracheal lymphadenopathy. He also has a 1.4 cm x 1 cm soft tissue nodule in the skin/subcutaneous fat in the left axilla. 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 He was diagnosed in August 2021  Molecular Biomarkers: BIOMARKER(S)         % CFDNA OR AMPLIFICATION       ASSOCIATED FDA-APPROVED THERAPIES        CLINICAL TRIAL AVAILABILITY [id number] 2.9% None    Yes [id number] 0.4% None    Yes [id number] 0.2% None    Yes  PRIOR THERAPY: 1) SRS to the small metastatic brain lesion under the care of Dr. [name] on 03/18/20  CURRENT THERAPY:  Palliative systemic chemotherapy with carboplatin for an AUC of 5, Alimta 500 mg/m2, and Keytruda 200 mg IV every 3 weeks. First dose expected on 04/07/20. Status post 1 cycle.   INTERVAL HISTORY: Randy Andersen 64 y.o. male returns to the clinic today for a follow-up Visit today the patient is feeling achy today. He states he is hurting in his "bones".The patient had tolerated his first treatment well with chemotherapy without any adverse side effects. He does not receive CSF injections. He had been feeling well over the last few weeks but woke up today and notes generalized arthralgias all over his body. He carries the diagnosis of chronic pain but states this is different than his chronic pain. He denies any sick contacts. Denies any strenuous activity/exercise. No leg swelling or erythema. He denies an recent vaccinations. Denies any signs or symptoms of infection including nasal congestion, sore throat, skin infections, abdominal pain, or dysuria. He denies fevers, chills, recent night sweats, shortness of breath or cough. He denies nausea, vomiting, diarrhea, or  constipation.  Otherwise, he denies any headache or visual changes.  He did have a brief period of feeling light headed a few days ago that improved when he sat down. The patient is here today for evaluation before starting cycle #2 of treatment.  MEDICAL HISTORY: Past Medical History:  Diagnosis Date  . Asthma    as a child  . Chronic pain disorder    LT leg and foot  . Heart murmur    as a child  . Hypertension   . Malignant neoplasm of upper lobe of right lung ([place])   . Stroke [facility])    mini stroke - 2015, some tingling in fingers on right     ALLERGIES:  has No Known Allergies.  MEDICATIONS:  Current Outpatient Medications  Medication Sig Dispense Refill  . amLODipine (NORVASC) 10 MG tablet Take 10 mg by mouth daily.    Marland Kitchen doxylamine, Sleep, (UNISOM) 25 MG tablet Take 25 mg by mouth at bedtime as needed for sleep.     . folic acid (FOLVITE) 1 MG tablet Take 1 tablet (1 mg total) by mouth daily. 30 tablet 2  . HYDROcodone-acetaminophen (NORCO) 10-325 MG tablet Take 1 tablet by mouth 2 (two) times daily.    . prochlorperazine (COMPAZINE) 10 MG tablet Take 1 tablet (10 mg total) by mouth every 6 (six) hours as needed. 30 tablet 2   No current facility-administered medications for this visit.    SURGICAL HISTORY:  Past Surgical History:  Procedure Laterality Date  .  BUNIONECTOMY Left    had hammer toe surgery also and callus removed  . BUNIONECTOMY Right    also had hammer toe surgery and callus removed  . LEG SURGERY Left    PT reports he has pins placed in his Lt leg  . ORIF TIBIA PLATEAU  10/09/2012   Dr Lorin Mercy  . ORIF TIBIA PLATEAU Left 10/08/2012   Procedure: OPEN REDUCTION INTERNAL FIXATION (ORIF) TIBIAL PLATEAU;  Surgeon: Marybelle Killings, MD;  Location: [place];  Service: Orthopedics;  Laterality: Left;  Marland Kitchen VIDEO BRONCHOSCOPY WITH ENDOBRONCHIAL NAVIGATION N/A 02/25/2020   Procedure: VIDEO BRONCHOSCOPY WITH ENDOBRONCHIAL NAVIGATION with biopsies;  Surgeon: Collene Gobble, MD;   Location: [place];  Service: Thoracic;  Laterality: N/A;  . VIDEO BRONCHOSCOPY WITH ENDOBRONCHIAL ULTRASOUND N/A 02/25/2020   Procedure: VIDEO BRONCHOSCOPY WITH ENDOBRONCHIAL ULTRASOUND;  Surgeon: Collene Gobble, MD;  Location: [facility];  Service: Thoracic;  Laterality: N/A;    REVIEW OF SYSTEMS:   Review of Systems  Constitutional: Negative for appetite change, chills, fatigue, fever and unexpected weight change.  HENT: Negative for mouth sores, nosebleeds, sore throat and trouble swallowing.   Eyes: Negative for eye problems and icterus.  Respiratory: Negative for cough, hemoptysis, shortness of breath and wheezing.   Cardiovascular: Negative for chest pain and leg swelling.  Gastrointestinal: Negative for abdominal pain, constipation, diarrhea, nausea and vomiting.  Genitourinary: Negative for bladder incontinence, difficulty urinating, dysuria, frequency and hematuria.   Musculoskeletal: Positive for generalized arthralgias. Negative for back pain, gait problem, neck pain and neck stiffness.  Skin: Negative for itching and rash.  Neurological: Negative for dizziness, extremity weakness, gait problem, headaches, light-headedness and seizures.  Hematological: Negative for adenopathy. Does not bruise/bleed easily.  Psychiatric/Behavioral: Negative for confusion, depression and sleep disturbance. The patient is not nervous/anxious.     PHYSICAL EXAMINATION:  There were no vitals taken for this visit.  ECOG PERFORMANCE STATUS: 1 - Symptomatic but completely ambulatory  Physical Exam  Constitutional: Oriented to person, place, and time and well-developed, well-nourished, and in no distress.  HENT:  Head: Normocephalic and atraumatic.  Mouth/Throat: Oropharynx is clear and moist. No oropharyngeal exudate.  Eyes: Conjunctivae are normal. Right eye exhibits no discharge. Left eye exhibits no discharge. No scleral icterus.  Neck: Normal range of motion. Neck supple.  Cardiovascular: Normal  rate, regular rhythm, normal heart sounds and intact distal pulses.   Pulmonary/Chest: Effort normal and breath sounds normal. No respiratory distress. No wheezes. No rales.  Abdominal: Soft. Bowel sounds are normal. Exhibits no distension and no mass. There is no tenderness.  Musculoskeletal: Normal range of motion. Exhibits no edema. No calf swelling or erythema.  Lymphadenopathy:    No cervical adenopathy.  Neurological: Alert and oriented to person, place, and time. Exhibits normal muscle tone. Gait normal. Coordination normal.  Skin: Skin is warm and dry. No rash noted. Not diaphoretic. No erythema. No pallor.  Psychiatric: Mood, memory and judgment normal.  Vitals reviewed.  LABORATORY DATA: Lab Results  Component Value Date   WBC 3.4 (L) 04/21/2020   HGB 13.2 04/21/2020   HCT 40.0 04/21/2020   MCV 84.7 04/21/2020   PLT 196 04/21/2020      Chemistry      Component Value Date/Time   NA 138 04/21/2020 1403   K 4.3 04/21/2020 1403   CL 103 04/21/2020 1403   CO2 27 04/21/2020 1403   BUN 20 04/21/2020 1403   CREATININE 1.58 (H) 04/21/2020 1403      Component  Value Date/Time   CALCIUM 9.5 04/21/2020 1403   ALKPHOS 120 04/21/2020 1403   AST 19 04/21/2020 1403   ALT 30 04/21/2020 1403   BILITOT 0.3 04/21/2020 1403       RADIOGRAPHIC STUDIES:  No results found.   ASSESSMENT/PLAN:  This is a very pleasant 64 year old African-American male with stage IV non-small cell lung cancer, favored to be adenocarcinoma. He presented with posterior right upper lobe nodule as well as right hilar, infrahilar, subcarinal, and right paratracheal lymphadenopathy. He also has a 1.4 cm x 1 cm soft tissue nodule in the skin/subcutaneous fat in the left axilla. 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 He was diagnosed in August 2021. Molecular studies by [employer] are negative for any actionable mutations.  The patient completed SRS to the small metastatic  brain lesion under the care of Dr. [name] on 03/18/20  The patient is currently undergoing palliative systemic chemotherapy with carboplatin for an AUC of 5, Alimta 500 mg per metered squared, and Keytruda 200 mg IV every 3 weeks.  He is status post his first cycle of treatment and he tolerated it well without any adverse side effects.  I discussed the patient's symptom of one day of generalized arthralgias with Dr. Julien Nordmann today. The patient is afebrile, non-toxic appearing, no leukocytosis, no hypoxia, no signs or symptoms of infection. The patient does not receive CSF injections. In the absence of any other associated symptoms, we discussed that he may proceed with treatment today. The patient decided to continue with treatment today. I will place an order for extra strenght tylenol. Of course, if he develops new or worsening symptoms, he was advised to call us back.  Recommend he proceed with cycle #2 today scheduled.  We will see him back for follow-up visit in 3 weeks for evaluation before starting cycle #3.  The patient was advised to call immediately if he has any concerning symptoms in the interval. The patient voices understanding of current disease status and treatment options and is in agreement with the current care plan. All questions were answered. The patient knows to call the clinic with any problems, questions or concerns. We can certainly see the patient much sooner if necessary       No orders of the defined types were placed in this encounter.    Randy Andersen L Lliam Hoh, PA-C 04/26/20

## 2020-04-28 ENCOUNTER — Inpatient Hospital Stay: Payer: No Typology Code available for payment source | Admitting: General Practice

## 2020-04-28 ENCOUNTER — Inpatient Hospital Stay: Payer: No Typology Code available for payment source

## 2020-04-28 ENCOUNTER — Inpatient Hospital Stay (HOSPITAL_BASED_OUTPATIENT_CLINIC_OR_DEPARTMENT_OTHER): Payer: No Typology Code available for payment source | Admitting: Physician Assistant

## 2020-04-28 ENCOUNTER — Other Ambulatory Visit: Payer: Self-pay

## 2020-04-28 ENCOUNTER — Inpatient Hospital Stay: Payer: No Typology Code available for payment source | Attending: Physician Assistant

## 2020-04-28 ENCOUNTER — Inpatient Hospital Stay: Payer: No Typology Code available for payment source | Admitting: Nutrition

## 2020-04-28 VITALS — BP 150/88 | HR 75 | Temp 97.9°F | Resp 19 | Ht 73.0 in | Wt 181.4 lb

## 2020-04-28 DIAGNOSIS — Z5112 Encounter for antineoplastic immunotherapy: Secondary | ICD-10-CM

## 2020-04-28 DIAGNOSIS — C3411 Malignant neoplasm of upper lobe, right bronchus or lung: Secondary | ICD-10-CM

## 2020-04-28 DIAGNOSIS — Z79899 Other long term (current) drug therapy: Secondary | ICD-10-CM | POA: Diagnosis not present

## 2020-04-28 DIAGNOSIS — Z5111 Encounter for antineoplastic chemotherapy: Secondary | ICD-10-CM | POA: Diagnosis present

## 2020-04-28 DIAGNOSIS — M898X9 Other specified disorders of bone, unspecified site: Secondary | ICD-10-CM

## 2020-04-28 DIAGNOSIS — C7931 Secondary malignant neoplasm of brain: Secondary | ICD-10-CM | POA: Insufficient documentation

## 2020-04-28 LAB — CBC WITH DIFFERENTIAL (CANCER CENTER ONLY)
Abs Immature Granulocytes: 0.03 10*3/uL (ref 0.00–0.07)
Basophils Absolute: 0.1 10*3/uL (ref 0.0–0.1)
Basophils Relative: 1 %
Eosinophils Absolute: 0.1 10*3/uL (ref 0.0–0.5)
Eosinophils Relative: 1 %
HCT: 39.8 % (ref 39.0–52.0)
Hemoglobin: 13.2 g/dL (ref 13.0–17.0)
Immature Granulocytes: 1 %
Lymphocytes Relative: 25 %
Lymphs Abs: 1.5 10*3/uL (ref 0.7–4.0)
MCH: 28.4 pg (ref 26.0–34.0)
MCHC: 33.2 g/dL (ref 30.0–36.0)
MCV: 85.8 fL (ref 80.0–100.0)
Monocytes Absolute: 0.8 10*3/uL (ref 0.1–1.0)
Monocytes Relative: 13 %
Neutro Abs: 3.7 10*3/uL (ref 1.7–7.7)
Neutrophils Relative %: 59 %
Platelet Count: 289 10*3/uL (ref 150–400)
RBC: 4.64 MIL/uL (ref 4.22–5.81)
RDW: 13.8 % (ref 11.5–15.5)
WBC Count: 6.2 10*3/uL (ref 4.0–10.5)
nRBC: 0 % (ref 0.0–0.2)

## 2020-04-28 LAB — CMP (CANCER CENTER ONLY)
ALT: 30 U/L (ref 0–44)
AST: 22 U/L (ref 15–41)
Albumin: 3.7 g/dL (ref 3.5–5.0)
Alkaline Phosphatase: 127 U/L — ABNORMAL HIGH (ref 38–126)
Anion gap: 5 (ref 5–15)
BUN: 14 mg/dL (ref 8–23)
CO2: 30 mmol/L (ref 22–32)
Calcium: 10 mg/dL (ref 8.9–10.3)
Chloride: 104 mmol/L (ref 98–111)
Creatinine: 1.32 mg/dL — ABNORMAL HIGH (ref 0.61–1.24)
GFR, Estimated: 56 mL/min — ABNORMAL LOW (ref >60)
Glucose, Bld: 103 mg/dL — ABNORMAL HIGH (ref 70–99)
Potassium: 4.3 mmol/L (ref 3.5–5.1)
Sodium: 139 mmol/L (ref 135–145)
Total Bilirubin: 0.3 mg/dL (ref 0.3–1.2)
Total Protein: 8 g/dL (ref 6.5–8.1)

## 2020-04-28 LAB — TSH: TSH: 1.6 u[IU]/mL (ref 0.320–4.118)

## 2020-04-28 MED ORDER — SODIUM CHLORIDE 0.9 % IV SOLN
500.0000 mg/m2 | Freq: Once | INTRAVENOUS | Status: AC
  Administered 2020-04-28: 1000 mg via INTRAVENOUS
  Filled 2020-04-28: qty 40

## 2020-04-28 MED ORDER — PALONOSETRON HCL INJECTION 0.25 MG/5ML
INTRAVENOUS | Status: AC
  Filled 2020-04-28: qty 5

## 2020-04-28 MED ORDER — PALONOSETRON HCL INJECTION 0.25 MG/5ML
0.2500 mg | Freq: Once | INTRAVENOUS | Status: AC
  Administered 2020-04-28: 0.25 mg via INTRAVENOUS

## 2020-04-28 MED ORDER — SODIUM CHLORIDE 0.9 % IV SOLN
200.0000 mg | Freq: Once | INTRAVENOUS | Status: AC
  Administered 2020-04-28: 200 mg via INTRAVENOUS
  Filled 2020-04-28: qty 8

## 2020-04-28 MED ORDER — SODIUM CHLORIDE 0.9 % IV SOLN
450.0000 mg | Freq: Once | INTRAVENOUS | Status: AC
  Administered 2020-04-28: 450 mg via INTRAVENOUS
  Filled 2020-04-28: qty 45

## 2020-04-28 MED ORDER — SODIUM CHLORIDE 0.9 % IV SOLN
10.0000 mg | Freq: Once | INTRAVENOUS | Status: AC
  Administered 2020-04-28: 10 mg via INTRAVENOUS
  Filled 2020-04-28: qty 10

## 2020-04-28 MED ORDER — ACETAMINOPHEN 325 MG PO TABS: 650.0000 mg | ORAL_TABLET | Freq: Four times a day (QID) | ORAL | Status: DC | PRN

## 2020-04-28 MED ORDER — SODIUM CHLORIDE 0.9 % IV SOLN
150.0000 mg | Freq: Once | INTRAVENOUS | Status: AC
  Administered 2020-04-28: 150 mg via INTRAVENOUS
  Filled 2020-04-28: qty 150

## 2020-04-28 MED ORDER — SODIUM CHLORIDE 0.9 % IV SOLN
Freq: Once | INTRAVENOUS | Status: AC
  Filled 2020-04-28: qty 250

## 2020-04-28 NOTE — Patient Instructions (Signed)
[place] Discharge Instructions for Patients Receiving Chemotherapy  Today you received the following chemotherapy agents Keytruda; Alimta; Carboplatin  To help prevent nausea and vomiting after your treatment, we encourage you to take your nausea medication as directed   If you develop nausea and vomiting that is not controlled by your nausea medication, call the clinic.   BELOW ARE SYMPTOMS THAT SHOULD BE REPORTED IMMEDIATELY:  *FEVER GREATER THAN 100.5 F  *CHILLS WITH OR WITHOUT FEVER  NAUSEA AND VOMITING THAT IS NOT CONTROLLED WITH YOUR NAUSEA MEDICATION  *UNUSUAL SHORTNESS OF BREATH  *UNUSUAL BRUISING OR BLEEDING  TENDERNESS IN MOUTH AND THROAT WITH OR WITHOUT PRESENCE OF ULCERS  *URINARY PROBLEMS  *BOWEL PROBLEMS  UNUSUAL RASH Items with * indicate a potential emergency and should be followed up as soon as possible.  Feel free to call the clinic should you have any questions or concerns. The clinic phone number is [phone].  Please show the [place] at check-in to the Emergency Department and triage nurse.

## 2020-04-28 NOTE — Progress Notes (Signed)
[place] CSW Progress Notes  Met w patient in infusion.  States he has no transportation needs at this time - plans to drive himself to/from appointments.  Housing is stable at this time, no needs re utilities or rent.  Mentioned he would like "blankets" due to oncoming cold weather - enrolled in [place] and provided first disbursement to assist w basic needs, also submitted application for [employer] gas card program as he is driving himself to treatments.  Advised patient will need to contact Support Team for further [employer] (he can have up to 3 more).  Edwyna Shell, LCSW Clinical Social Worker Phone:  [phone]

## 2020-04-28 NOTE — Progress Notes (Signed)
Nutrition follow-up completed with patient receiving treatment for stage IV non-small cell lung cancer. Weight is stable at 180.9 pounds. Patient denies nausea, vomiting, constipation, and diarrhea. He reports he has pain all over but no nutrition impact symptoms. He enjoyed oral nutrition supplements.  Nutrition diagnosis: Food and nutrition related knowledge deficit improved.  Intervention: Encourage patient to continue strategies for adequate calories and protein for weight maintenance. Provided 1 complementary case of Ensure Enlive. Encourage patient to contact me with questions.  Monitoring, evaluation, goals: Patient will tolerate adequate calories and protein for weight maintenance.  Next visit: To be scheduled with treatment as needed.  **Disclaimer: This note was dictated with voice recognition software. Similar sounding words can inadvertently be transcribed and this note may contain transcription errors which may not have been corrected upon publication of note.**

## 2020-05-05 ENCOUNTER — Inpatient Hospital Stay: Payer: No Typology Code available for payment source

## 2020-05-12 ENCOUNTER — Other Ambulatory Visit: Payer: Self-pay

## 2020-05-12 ENCOUNTER — Inpatient Hospital Stay: Payer: No Typology Code available for payment source

## 2020-05-12 DIAGNOSIS — Z5112 Encounter for antineoplastic immunotherapy: Secondary | ICD-10-CM | POA: Diagnosis not present

## 2020-05-12 DIAGNOSIS — C3411 Malignant neoplasm of upper lobe, right bronchus or lung: Secondary | ICD-10-CM

## 2020-05-12 LAB — CBC WITH DIFFERENTIAL (CANCER CENTER ONLY)
Abs Immature Granulocytes: 0.01 10*3/uL (ref 0.00–0.07)
Basophils Absolute: 0 10*3/uL (ref 0.0–0.1)
Basophils Relative: 0 %
Eosinophils Absolute: 0 10*3/uL (ref 0.0–0.5)
Eosinophils Relative: 1 %
HCT: 33.2 % — ABNORMAL LOW (ref 39.0–52.0)
Hemoglobin: 11.2 g/dL — ABNORMAL LOW (ref 13.0–17.0)
Immature Granulocytes: 0 %
Lymphocytes Relative: 48 %
Lymphs Abs: 1.5 10*3/uL (ref 0.7–4.0)
MCH: 28.8 pg (ref 26.0–34.0)
MCHC: 33.7 g/dL (ref 30.0–36.0)
MCV: 85.3 fL (ref 80.0–100.0)
Monocytes Absolute: 0.5 10*3/uL (ref 0.1–1.0)
Monocytes Relative: 15 %
Neutro Abs: 1.2 10*3/uL — ABNORMAL LOW (ref 1.7–7.7)
Neutrophils Relative %: 36 %
Platelet Count: 127 10*3/uL — ABNORMAL LOW (ref 150–400)
RBC: 3.89 MIL/uL — ABNORMAL LOW (ref 4.22–5.81)
RDW: 13.9 % (ref 11.5–15.5)
WBC Count: 3.2 10*3/uL — ABNORMAL LOW (ref 4.0–10.5)
nRBC: 0 % (ref 0.0–0.2)

## 2020-05-12 LAB — CMP (CANCER CENTER ONLY)
ALT: 33 U/L (ref 0–44)
AST: 19 U/L (ref 15–41)
Albumin: 3.6 g/dL (ref 3.5–5.0)
Alkaline Phosphatase: 110 U/L (ref 38–126)
Anion gap: 4 — ABNORMAL LOW (ref 5–15)
BUN: 19 mg/dL (ref 8–23)
CO2: 27 mmol/L (ref 22–32)
Calcium: 9.6 mg/dL (ref 8.9–10.3)
Chloride: 107 mmol/L (ref 98–111)
Creatinine: 1.45 mg/dL — ABNORMAL HIGH (ref 0.61–1.24)
GFR, Estimated: 50 mL/min — ABNORMAL LOW (ref >60)
Glucose, Bld: 125 mg/dL — ABNORMAL HIGH (ref 70–99)
Potassium: 4 mmol/L (ref 3.5–5.1)
Sodium: 138 mmol/L (ref 135–145)
Total Bilirubin: 0.2 mg/dL — ABNORMAL LOW (ref 0.3–1.2)
Total Protein: 7.2 g/dL (ref 6.5–8.1)

## 2020-05-12 LAB — TSH: TSH: 1.191 u[IU]/mL (ref 0.320–4.118)

## 2020-05-19 ENCOUNTER — Other Ambulatory Visit: Payer: Self-pay

## 2020-05-19 ENCOUNTER — Inpatient Hospital Stay: Payer: No Typology Code available for payment source

## 2020-05-19 ENCOUNTER — Inpatient Hospital Stay (HOSPITAL_BASED_OUTPATIENT_CLINIC_OR_DEPARTMENT_OTHER): Payer: No Typology Code available for payment source | Admitting: Internal Medicine

## 2020-05-19 ENCOUNTER — Encounter: Payer: Self-pay | Admitting: Internal Medicine

## 2020-05-19 VITALS — BP 125/82 | HR 74 | Temp 97.8°F | Resp 18 | Ht 73.0 in | Wt 183.8 lb

## 2020-05-19 DIAGNOSIS — C3411 Malignant neoplasm of upper lobe, right bronchus or lung: Secondary | ICD-10-CM

## 2020-05-19 DIAGNOSIS — C3490 Malignant neoplasm of unspecified part of unspecified bronchus or lung: Secondary | ICD-10-CM

## 2020-05-19 DIAGNOSIS — Z5112 Encounter for antineoplastic immunotherapy: Secondary | ICD-10-CM | POA: Diagnosis not present

## 2020-05-19 DIAGNOSIS — Z5111 Encounter for antineoplastic chemotherapy: Secondary | ICD-10-CM

## 2020-05-19 LAB — CMP (CANCER CENTER ONLY)
ALT: 26 U/L (ref 0–44)
AST: 19 U/L (ref 15–41)
Albumin: 3.8 g/dL (ref 3.5–5.0)
Alkaline Phosphatase: 116 U/L (ref 38–126)
Anion gap: 5 (ref 5–15)
BUN: 17 mg/dL (ref 8–23)
CO2: 25 mmol/L (ref 22–32)
Calcium: 9.7 mg/dL (ref 8.9–10.3)
Chloride: 107 mmol/L (ref 98–111)
Creatinine: 1.44 mg/dL — ABNORMAL HIGH (ref 0.61–1.24)
GFR, Estimated: 54 mL/min — ABNORMAL LOW (ref >60)
Glucose, Bld: 97 mg/dL (ref 70–99)
Potassium: 3.8 mmol/L (ref 3.5–5.1)
Sodium: 137 mmol/L (ref 135–145)
Total Bilirubin: 0.3 mg/dL (ref 0.3–1.2)
Total Protein: 7.4 g/dL (ref 6.5–8.1)

## 2020-05-19 LAB — CBC WITH DIFFERENTIAL (CANCER CENTER ONLY)
Abs Immature Granulocytes: 0.02 10*3/uL (ref 0.00–0.07)
Basophils Absolute: 0 10*3/uL (ref 0.0–0.1)
Basophils Relative: 1 %
Eosinophils Absolute: 0.1 10*3/uL (ref 0.0–0.5)
Eosinophils Relative: 1 %
HCT: 34.9 % — ABNORMAL LOW (ref 39.0–52.0)
Hemoglobin: 11.8 g/dL — ABNORMAL LOW (ref 13.0–17.0)
Immature Granulocytes: 0 %
Lymphocytes Relative: 27 %
Lymphs Abs: 1.3 10*3/uL (ref 0.7–4.0)
MCH: 28.5 pg (ref 26.0–34.0)
MCHC: 33.8 g/dL (ref 30.0–36.0)
MCV: 84.3 fL (ref 80.0–100.0)
Monocytes Absolute: 0.7 10*3/uL (ref 0.1–1.0)
Monocytes Relative: 14 %
Neutro Abs: 2.8 10*3/uL (ref 1.7–7.7)
Neutrophils Relative %: 57 %
Platelet Count: 215 10*3/uL (ref 150–400)
RBC: 4.14 MIL/uL — ABNORMAL LOW (ref 4.22–5.81)
RDW: 15.6 % — ABNORMAL HIGH (ref 11.5–15.5)
WBC Count: 5 10*3/uL (ref 4.0–10.5)
nRBC: 0 % (ref 0.0–0.2)

## 2020-05-19 MED ORDER — CYANOCOBALAMIN 1000 MCG/ML IJ SOLN
INTRAMUSCULAR | Status: AC
  Filled 2020-05-19: qty 1

## 2020-05-19 MED ORDER — SODIUM CHLORIDE 0.9 % IV SOLN
500.0000 mg/m2 | Freq: Once | INTRAVENOUS | Status: AC
  Administered 2020-05-19: 1000 mg via INTRAVENOUS
  Filled 2020-05-19: qty 40

## 2020-05-19 MED ORDER — SODIUM CHLORIDE 0.9 % IV SOLN
421.0000 mg | Freq: Once | INTRAVENOUS | Status: AC
  Administered 2020-05-19: 420 mg via INTRAVENOUS
  Filled 2020-05-19: qty 42

## 2020-05-19 MED ORDER — SODIUM CHLORIDE 0.9 % IV SOLN
10.0000 mg | Freq: Once | INTRAVENOUS | Status: AC
  Administered 2020-05-19: 10 mg via INTRAVENOUS
  Filled 2020-05-19: qty 10

## 2020-05-19 MED ORDER — SODIUM CHLORIDE 0.9 % IV SOLN
150.0000 mg | Freq: Once | INTRAVENOUS | Status: AC
  Administered 2020-05-19: 150 mg via INTRAVENOUS
  Filled 2020-05-19: qty 150

## 2020-05-19 MED ORDER — SODIUM CHLORIDE 0.9 % IV SOLN
200.0000 mg | Freq: Once | INTRAVENOUS | Status: AC
  Administered 2020-05-19: 200 mg via INTRAVENOUS
  Filled 2020-05-19: qty 8

## 2020-05-19 MED ORDER — SODIUM CHLORIDE 0.9 % IV SOLN
Freq: Once | INTRAVENOUS | Status: AC
  Filled 2020-05-19: qty 250

## 2020-05-19 MED ORDER — CYANOCOBALAMIN 1000 MCG/ML IJ SOLN
1000.0000 ug | Freq: Once | INTRAMUSCULAR | Status: AC
  Administered 2020-05-19: 1000 ug via INTRAMUSCULAR

## 2020-05-19 MED ORDER — PALONOSETRON HCL INJECTION 0.25 MG/5ML
0.2500 mg | Freq: Once | INTRAVENOUS | Status: AC
  Administered 2020-05-19: 0.25 mg via INTRAVENOUS

## 2020-05-19 MED ORDER — PALONOSETRON HCL INJECTION 0.25 MG/5ML
INTRAVENOUS | Status: AC
  Filled 2020-05-19: qty 5

## 2020-05-19 NOTE — Progress Notes (Signed)
Randy Andersen [phone]   [phone]  OFFICE PROGRESS NOTE  Pcp, No No address on file  DIAGNOSIS: Stage IV non-small cell lung cancer, favored to be adenocarcinoma. He presented with posterior right upper lobe nodule as well as right hilar, infrahilar, subcarinal, and right paratracheal lymphadenopathy. He also has a 1.4 cm x 1 cm soft tissue nodule in the skin/subcutaneous fat in the left axilla. 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 He was diagnosed in August 2021  Molecular Biomarkers: BIOMARKER(S)         % CFDNA OR AMPLIFICATION       ASSOCIATED FDA-APPROVED THERAPIES        CLINICAL TRIAL AVAILABILITY [id number] 2.9% None    Yes [id number] 0.4% None    Yes [id number] 0.2% None    Yes  PRIOR THERAPY:  1) SRS to the small metastatic brain lesion under the care of Dr. [name] on 03/18/20  CURRENT THERAPY: Palliative systemic chemotherapy with carboplatin for an AUC of 5, Alimta 500 mg/m2, and Keytruda 200 mg IV every 3 weeks. First dose expected on 04/07/20.   INTERVAL HISTORY: Randy Andersen 65 y.o. male returns to the clinic today for follow-up visit.  The patient is feeling fine today with no concerning complaints.  He continues to tolerate his treatment fairly well.  He denied having any nausea, vomiting, diarrhea or constipation.  He has no chest pain, shortness of breath, cough or hemoptysis.  He denied having any headache or visual changes.  He has no weight loss or night sweats.  He was seen by Dr. Harlow Asa recently and he is expected to have surgery for his thyroid cancer and January 2022.  The patient is here today for evaluation before starting cycle #3 of his treatment.  MEDICAL HISTORY: Past Medical History:  Diagnosis Date  . Asthma    as a child  . Chronic pain disorder    LT leg and foot  . Heart murmur    as a child  . Hypertension   . Malignant neoplasm of upper lobe of right lung ([place])    . Stroke [facility])    mini stroke - 2015, some tingling in fingers on right     ALLERGIES:  has No Known Allergies.  MEDICATIONS:  Current Outpatient Medications  Medication Sig Dispense Refill  . amLODipine (NORVASC) 10 MG tablet Take 10 mg by mouth daily.    Marland Kitchen doxylamine, Sleep, (UNISOM) 25 MG tablet Take 25 mg by mouth at bedtime as needed for sleep.     . folic acid (FOLVITE) 1 MG tablet Take 1 tablet (1 mg total) by mouth daily. 30 tablet 2  . HYDROcodone-acetaminophen (NORCO) 10-325 MG tablet Take 1 tablet by mouth 2 (two) times daily.    . prochlorperazine (COMPAZINE) 10 MG tablet Take 1 tablet (10 mg total) by mouth every 6 (six) hours as needed. 30 tablet 2   No current facility-administered medications for this visit.    SURGICAL HISTORY:  Past Surgical History:  Procedure Laterality Date  . BUNIONECTOMY Left    had hammer toe surgery also and callus removed  . BUNIONECTOMY Right    also had hammer toe surgery and callus removed  . LEG SURGERY Left    PT reports he has pins placed in his Lt leg  . ORIF TIBIA PLATEAU  10/09/2012   Dr Lorin Mercy  . ORIF TIBIA PLATEAU Left 10/08/2012   Procedure: OPEN  REDUCTION INTERNAL FIXATION (ORIF) TIBIAL PLATEAU;  Surgeon: Marybelle Killings, MD;  Location: [place];  Service: Orthopedics;  Laterality: Left;  Marland Kitchen VIDEO BRONCHOSCOPY WITH ENDOBRONCHIAL NAVIGATION N/A 02/25/2020   Procedure: VIDEO BRONCHOSCOPY WITH ENDOBRONCHIAL NAVIGATION with biopsies;  Surgeon: Collene Gobble, MD;  Location: [place];  Service: Thoracic;  Laterality: N/A;  . VIDEO BRONCHOSCOPY WITH ENDOBRONCHIAL ULTRASOUND N/A 02/25/2020   Procedure: VIDEO BRONCHOSCOPY WITH ENDOBRONCHIAL ULTRASOUND;  Surgeon: Collene Gobble, MD;  Location: [facility] OR;  Service: Thoracic;  Laterality: N/A;    REVIEW OF SYSTEMS:  A comprehensive review of systems was negative.   PHYSICAL EXAMINATION: General appearance: alert, cooperative and no distress Head: Normocephalic, without obvious abnormality,  atraumatic Neck: no adenopathy, no JVD, supple, symmetrical, trachea midline and thyroid not enlarged, symmetric, no tenderness/mass/nodules Lymph nodes: Cervical, supraclavicular, and axillary nodes normal. Resp: clear to auscultation bilaterally Back: symmetric, no curvature. ROM normal. No CVA tenderness. Cardio: regular rate and rhythm, S1, S2 normal, no murmur, click, rub or gallop GI: soft, non-tender; bowel sounds normal; no masses,  no organomegaly Extremities: extremities normal, atraumatic, no cyanosis or edema  ECOG PERFORMANCE STATUS: 1 - Symptomatic but completely ambulatory  Blood pressure 125/82, pulse 74, temperature 97.8 F (36.6 C), temperature source Tympanic, resp. rate 18, height 6\' 1"  (1.854 m), weight 183 lb 12.8 oz (83.4 kg), SpO2 100 %.  LABORATORY DATA: Lab Results  Component Value Date   WBC 5.0 05/19/2020   HGB 11.8 (L) 05/19/2020   HCT 34.9 (L) 05/19/2020   MCV 84.3 05/19/2020   PLT 215 05/19/2020      Chemistry      Component Value Date/Time   NA 138 05/12/2020 1303   K 4.0 05/12/2020 1303   CL 107 05/12/2020 1303   CO2 27 05/12/2020 1303   BUN 19 05/12/2020 1303   CREATININE 1.45 (H) 05/12/2020 1303      Component Value Date/Time   CALCIUM 9.6 05/12/2020 1303   ALKPHOS 110 05/12/2020 1303   AST 19 05/12/2020 1303   ALT 33 05/12/2020 1303   BILITOT 0.2 (L) 05/12/2020 1303       RADIOGRAPHIC STUDIES: No results found.  ASSESSMENT AND PLAN: This is a very pleasant 64 years old African-American male diagnosed with stage IV non-small cell lung cancer, adenocarcinoma presented with right upper lobe nodule in addition to right hilar, infrahilar, subcarinal and right paratracheal adenopathy in addition to subcutaneous nodules in the left axilla and 2 small metastatic brain lesion diagnosed in August 2021.  The patient has no actionable mutations. He is currently undergoing systemic chemotherapy with carboplatin for AUC of 5, Alimta 500 mg/M2 and  Keytruda 200 mg IV every 3 weeks.  Status post 2 cycles. He tolerated the last cycle of his treatment well with no concerning complaints. I recommended for him to proceed with cycle #3 today as planned. I will see him back for follow-up visit in 3 weeks for evaluation with repeat CT scan of the chest, abdomen pelvis for restaging of his disease. For the suspicious thyroid neoplasm, he is followed by Dr. Harlow Asa and expected to have surgical resection in January 2022. The patient was advised to call immediately if he has any concerning symptoms in the interval. The patient voices understanding of current disease status and treatment options and is in agreement with the current care plan.  All questions were answered. The patient knows to call the clinic with any problems, questions or concerns. We can certainly see the patient much  sooner if necessary.   Disclaimer: This note was dictated with voice recognition software. Similar sounding words can inadvertently be transcribed and may not be corrected upon review.

## 2020-05-19 NOTE — Patient Instructions (Signed)
[facility] Discharge Instructions for Patients Receiving Chemotherapy  Today you received the following chemotherapy agents: Pembrolizumab (Keytruda), Pemetrexed (Alimta), and Carboplatin.  To help prevent nausea and vomiting after your treatment, we encourage you to take your nausea medication as directed by your MD.   If you develop nausea and vomiting that is not controlled by your nausea medication, call the clinic.   BELOW ARE SYMPTOMS THAT SHOULD BE REPORTED IMMEDIATELY:  *FEVER GREATER THAN 100.5 F  *CHILLS WITH OR WITHOUT FEVER  NAUSEA AND VOMITING THAT IS NOT CONTROLLED WITH YOUR NAUSEA MEDICATION  *UNUSUAL SHORTNESS OF BREATH  *UNUSUAL BRUISING OR BLEEDING  TENDERNESS IN MOUTH AND THROAT WITH OR WITHOUT PRESENCE OF ULCERS  *URINARY PROBLEMS  *BOWEL PROBLEMS  UNUSUAL RASH Items with * indicate a potential emergency and should be followed up as soon as possible.  Feel free to call the clinic should you have any questions or concerns. The clinic phone number is [phone].  Please show the [facility] at check-in to the Emergency Department and triage nurse.

## 2020-05-24 ENCOUNTER — Other Ambulatory Visit: Payer: Self-pay

## 2020-05-24 ENCOUNTER — Emergency Department (HOSPITAL_COMMUNITY)
Admission: EM | Admit: 2020-05-24 | Discharge: 2020-05-24 | Disposition: A | Discharge status: Home / Self Care | Payer: No Typology Code available for payment source | Attending: Emergency Medicine | Admitting: Emergency Medicine

## 2020-05-24 ENCOUNTER — Emergency Department (HOSPITAL_COMMUNITY): Payer: No Typology Code available for payment source

## 2020-05-24 ENCOUNTER — Encounter (HOSPITAL_COMMUNITY): Payer: Self-pay

## 2020-05-24 DIAGNOSIS — Z79899 Other long term (current) drug therapy: Secondary | ICD-10-CM | POA: Insufficient documentation

## 2020-05-24 DIAGNOSIS — I2693 Single subsegmental pulmonary embolism without acute cor pulmonale: Secondary | ICD-10-CM | POA: Diagnosis not present

## 2020-05-24 DIAGNOSIS — C3411 Malignant neoplasm of upper lobe, right bronchus or lung: Secondary | ICD-10-CM | POA: Insufficient documentation

## 2020-05-24 DIAGNOSIS — R1011 Right upper quadrant pain: Secondary | ICD-10-CM

## 2020-05-24 DIAGNOSIS — Z79811 Long term (current) use of aromatase inhibitors: Secondary | ICD-10-CM | POA: Insufficient documentation

## 2020-05-24 DIAGNOSIS — F17210 Nicotine dependence, cigarettes, uncomplicated: Secondary | ICD-10-CM | POA: Diagnosis not present

## 2020-05-24 DIAGNOSIS — R1031 Right lower quadrant pain: Secondary | ICD-10-CM | POA: Diagnosis present

## 2020-05-24 DIAGNOSIS — Z7901 Long term (current) use of anticoagulants: Secondary | ICD-10-CM | POA: Insufficient documentation

## 2020-05-24 DIAGNOSIS — R0602 Shortness of breath: Secondary | ICD-10-CM | POA: Diagnosis not present

## 2020-05-24 LAB — CBC WITH DIFFERENTIAL/PLATELET
Abs Immature Granulocytes: 0.02 10*3/uL (ref 0.00–0.07)
Basophils Absolute: 0 10*3/uL (ref 0.0–0.1)
Basophils Relative: 1 %
Eosinophils Absolute: 0 10*3/uL (ref 0.0–0.5)
Eosinophils Relative: 1 %
HCT: 36.3 % — ABNORMAL LOW (ref 39.0–52.0)
Hemoglobin: 12.1 g/dL — ABNORMAL LOW (ref 13.0–17.0)
Immature Granulocytes: 0 %
Lymphocytes Relative: 21 %
Lymphs Abs: 1 10*3/uL (ref 0.7–4.0)
MCH: 29.6 pg (ref 26.0–34.0)
MCHC: 33.3 g/dL (ref 30.0–36.0)
MCV: 88.8 fL (ref 80.0–100.0)
Monocytes Absolute: 0.2 10*3/uL (ref 0.1–1.0)
Monocytes Relative: 3 %
Neutro Abs: 3.8 10*3/uL (ref 1.7–7.7)
Neutrophils Relative %: 74 %
Platelets: 210 10*3/uL (ref 150–400)
RBC: 4.09 MIL/uL — ABNORMAL LOW (ref 4.22–5.81)
RDW: 15.5 % (ref 11.5–15.5)
WBC: 5 10*3/uL (ref 4.0–10.5)
nRBC: 0 % (ref 0.0–0.2)

## 2020-05-24 LAB — COMPREHENSIVE METABOLIC PANEL
ALT: 21 U/L (ref 0–44)
AST: 18 U/L (ref 15–41)
Albumin: 4.3 g/dL (ref 3.5–5.0)
Alkaline Phosphatase: 102 U/L (ref 38–126)
Anion gap: 11 (ref 5–15)
BUN: 18 mg/dL (ref 8–23)
CO2: 25 mmol/L (ref 22–32)
Calcium: 9.5 mg/dL (ref 8.9–10.3)
Chloride: 102 mmol/L (ref 98–111)
Creatinine, Ser: 1.31 mg/dL — ABNORMAL HIGH (ref 0.61–1.24)
GFR, Estimated: >60 mL/min (ref >60)
Glucose, Bld: 110 mg/dL — ABNORMAL HIGH (ref 70–99)
Potassium: 3.9 mmol/L (ref 3.5–5.1)
Sodium: 138 mmol/L (ref 135–145)
Total Bilirubin: 0.8 mg/dL (ref 0.3–1.2)
Total Protein: 8.3 g/dL — ABNORMAL HIGH (ref 6.5–8.1)

## 2020-05-24 LAB — LIPASE, BLOOD: Lipase: 33 U/L (ref 11–51)

## 2020-05-24 IMAGING — US US ABDOMEN LIMITED
1 series · 14 of 25 positions shown · non-contrast
Comparison: CT abdomen pelvis dated [DATE].

CLINICAL DATA: Right upper quadrant pain for the past 2 days.

EXAM:
ULTRASOUND ABDOMEN LIMITED RIGHT UPPER QUADRANT

[Series 1: us abdomen limited · 14 of 45 slices shown]
[im 1/45]
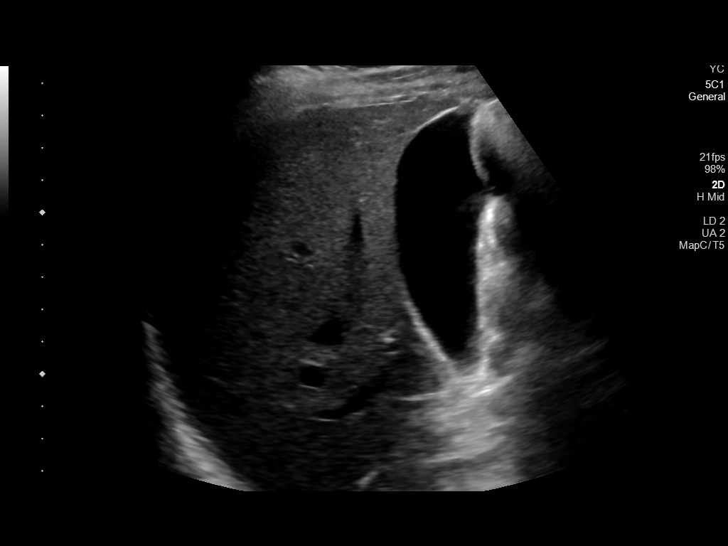
[im 4/45]
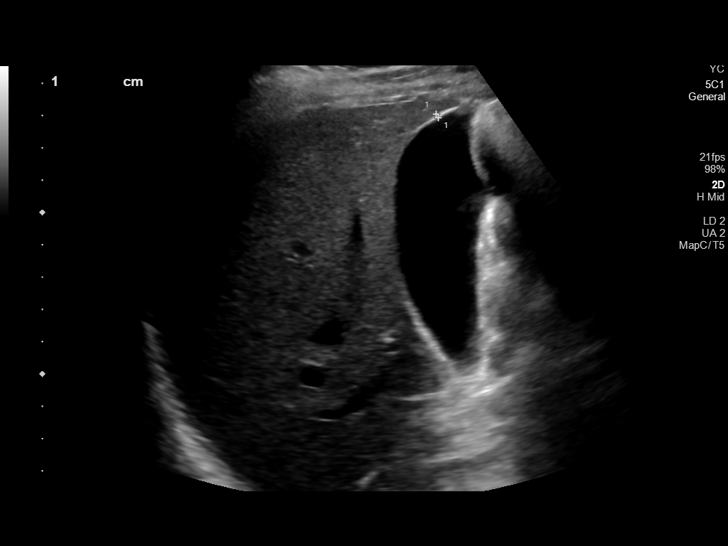
[im 8/45]
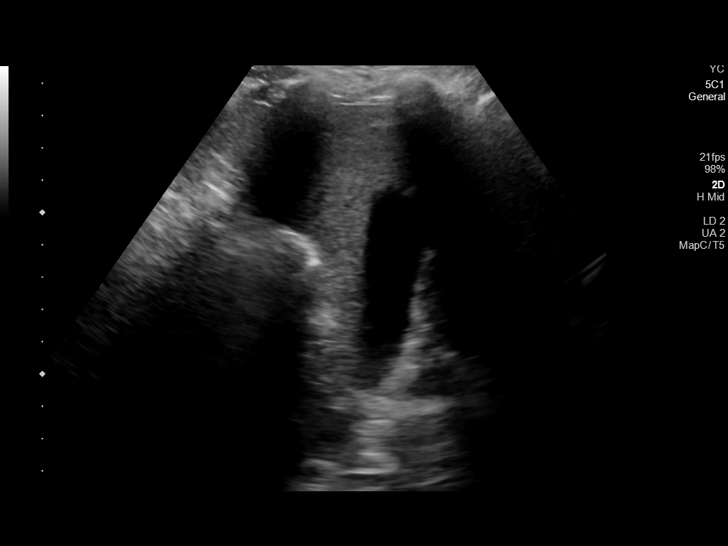
[im 12/45]
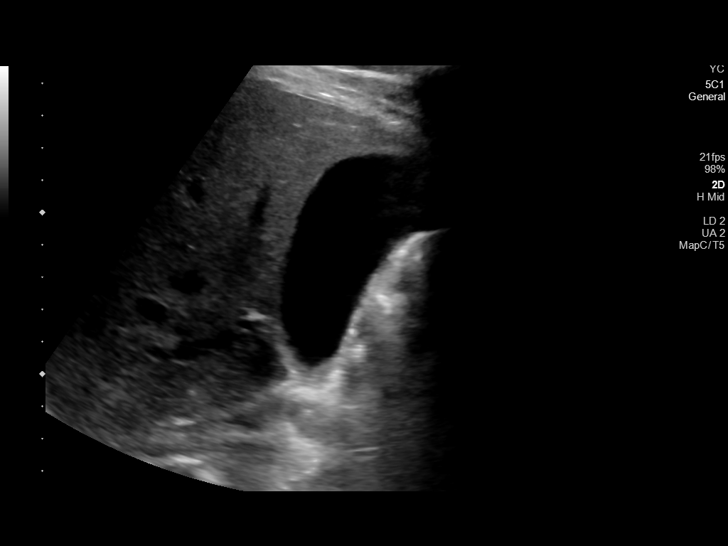
[im 15/45]
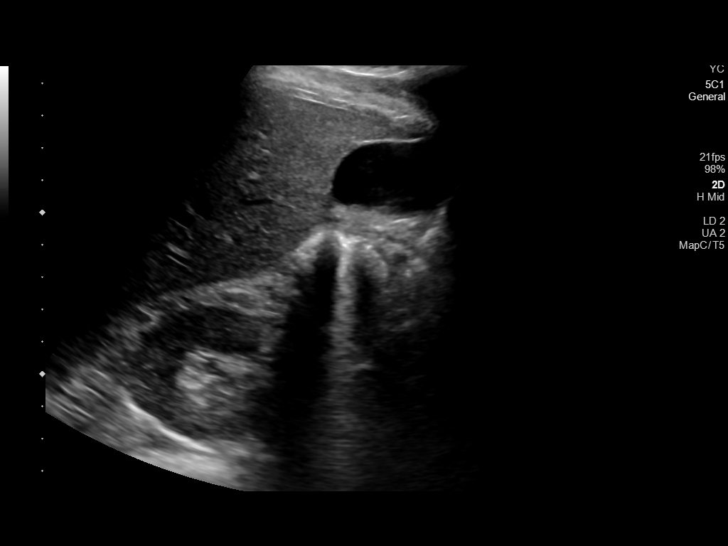
[im 17/45]
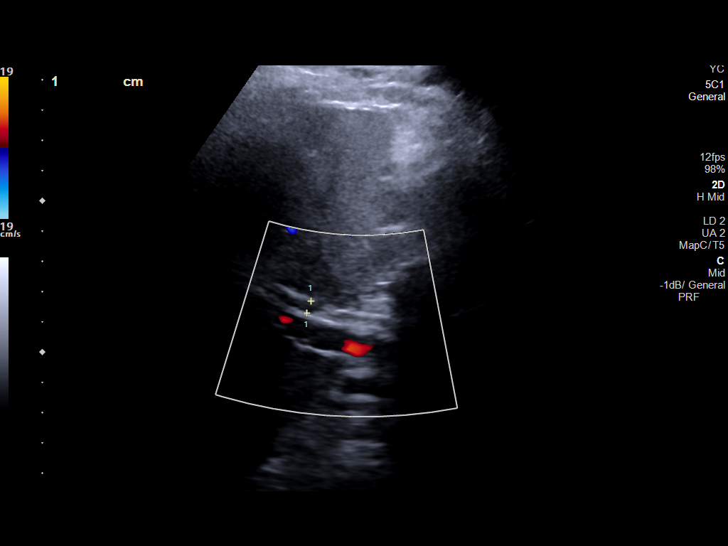
[im 21/45]
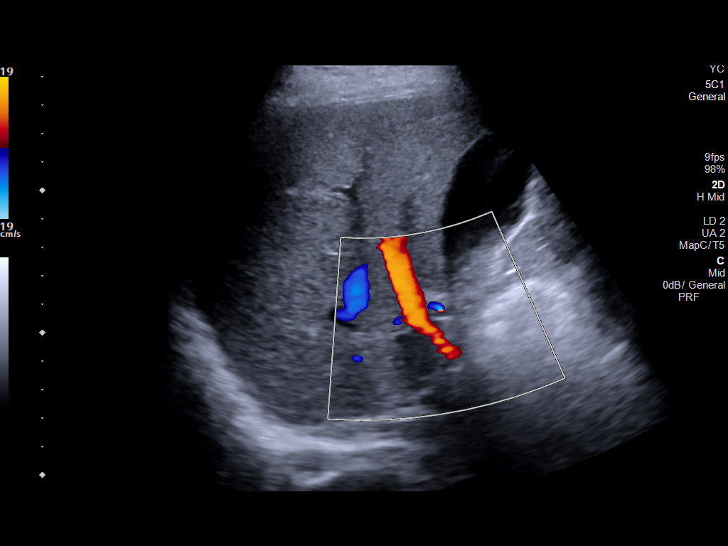
[im 24/45]
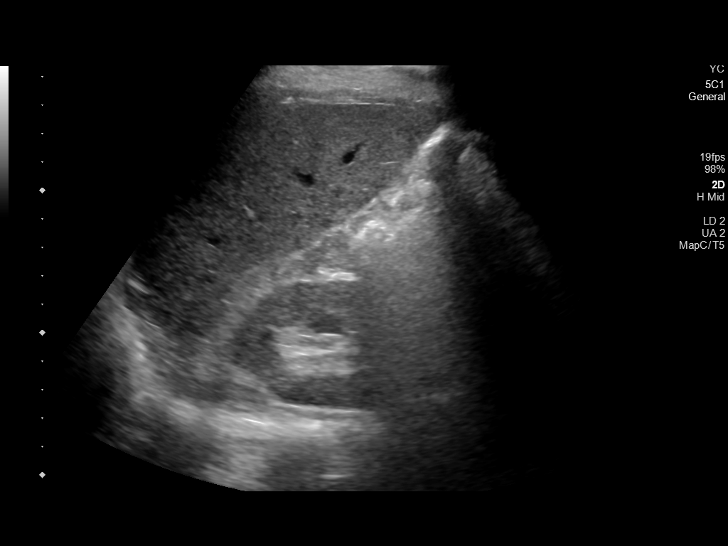
[im 28/45]
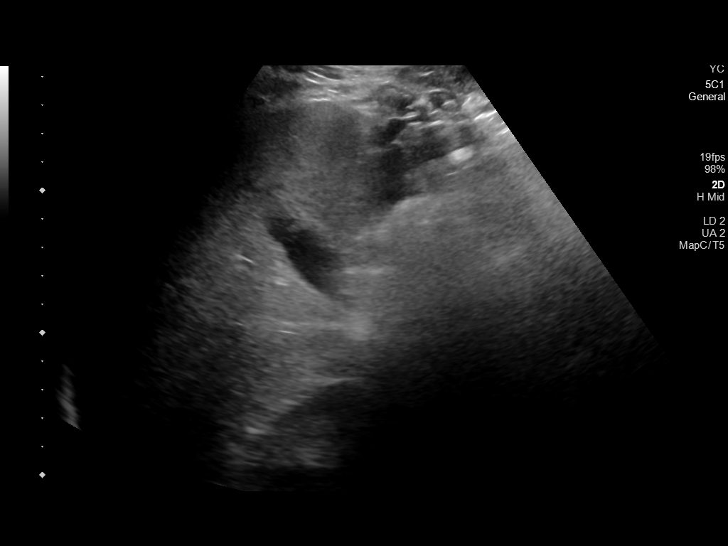
[im 30/45]
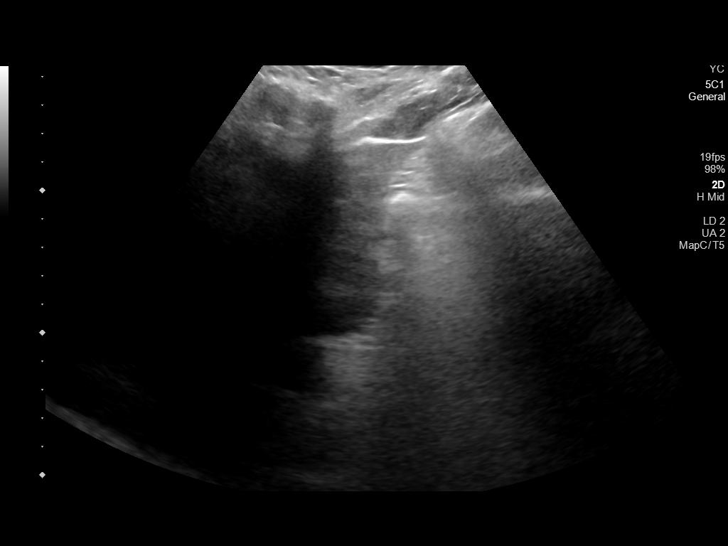
[im 34/45]
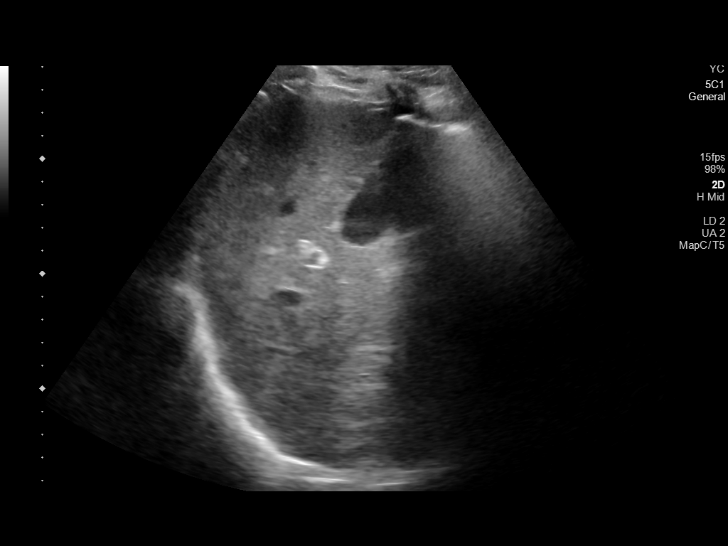
[im 37/45]
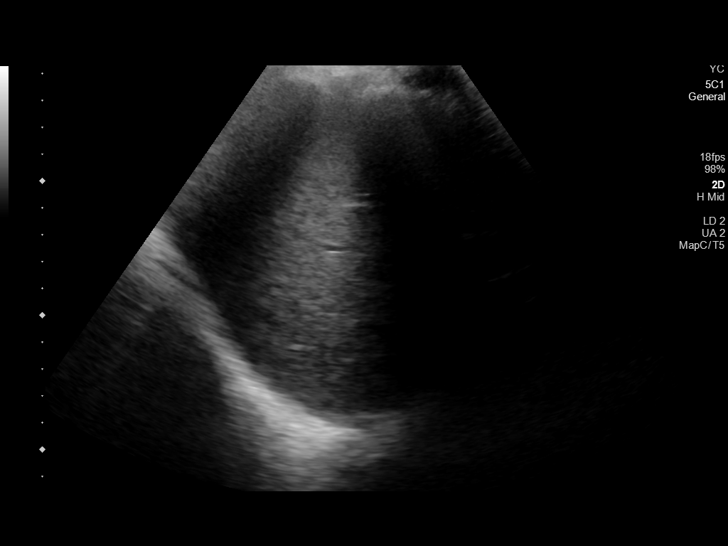
[im 41/45]
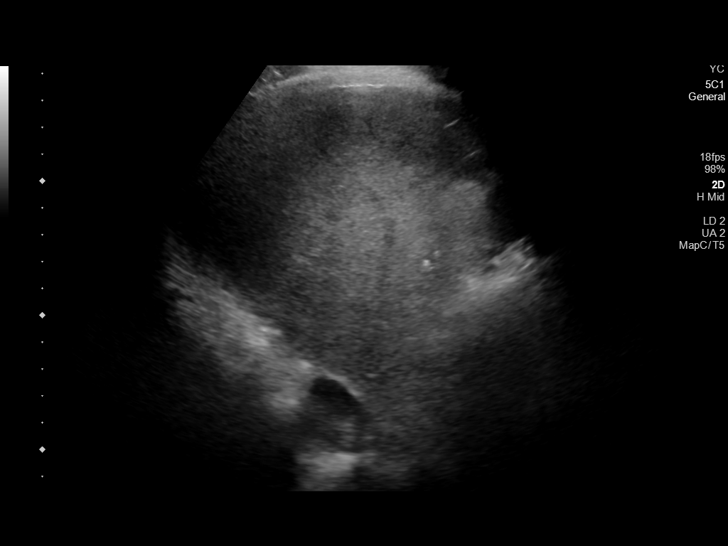
[im 45/45]
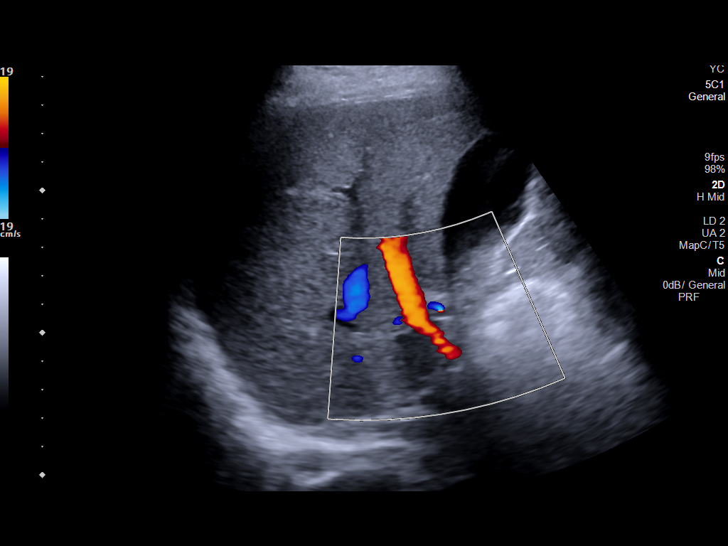

[14 of 25 positions shown; findings below may reference images not displayed]

FINDINGS: Gallbladder:

No gallstones or wall thickening visualized. No sonographic Murphy
sign noted by sonographer.

Common bile duct:

Diameter: 4 mm, normal.

Liver:

No focal lesion identified. Within normal limits in parenchymal
echogenicity. Portal vein is patent on color Doppler imaging with
normal direction of blood flow towards the liver.

Other: None.
IMPRESSION: 1. Normal right upper quadrant ultrasound.

## 2020-05-24 IMAGING — CR DG CHEST 2V
2 series · 2 of 2 positions shown · non-contrast
Comparison: [DATE], [DATE]

CLINICAL DATA: Short of breath, right upper lobe lung cancer, chest
pain

EXAM:
CHEST - 2 VIEW

[w chest pa]
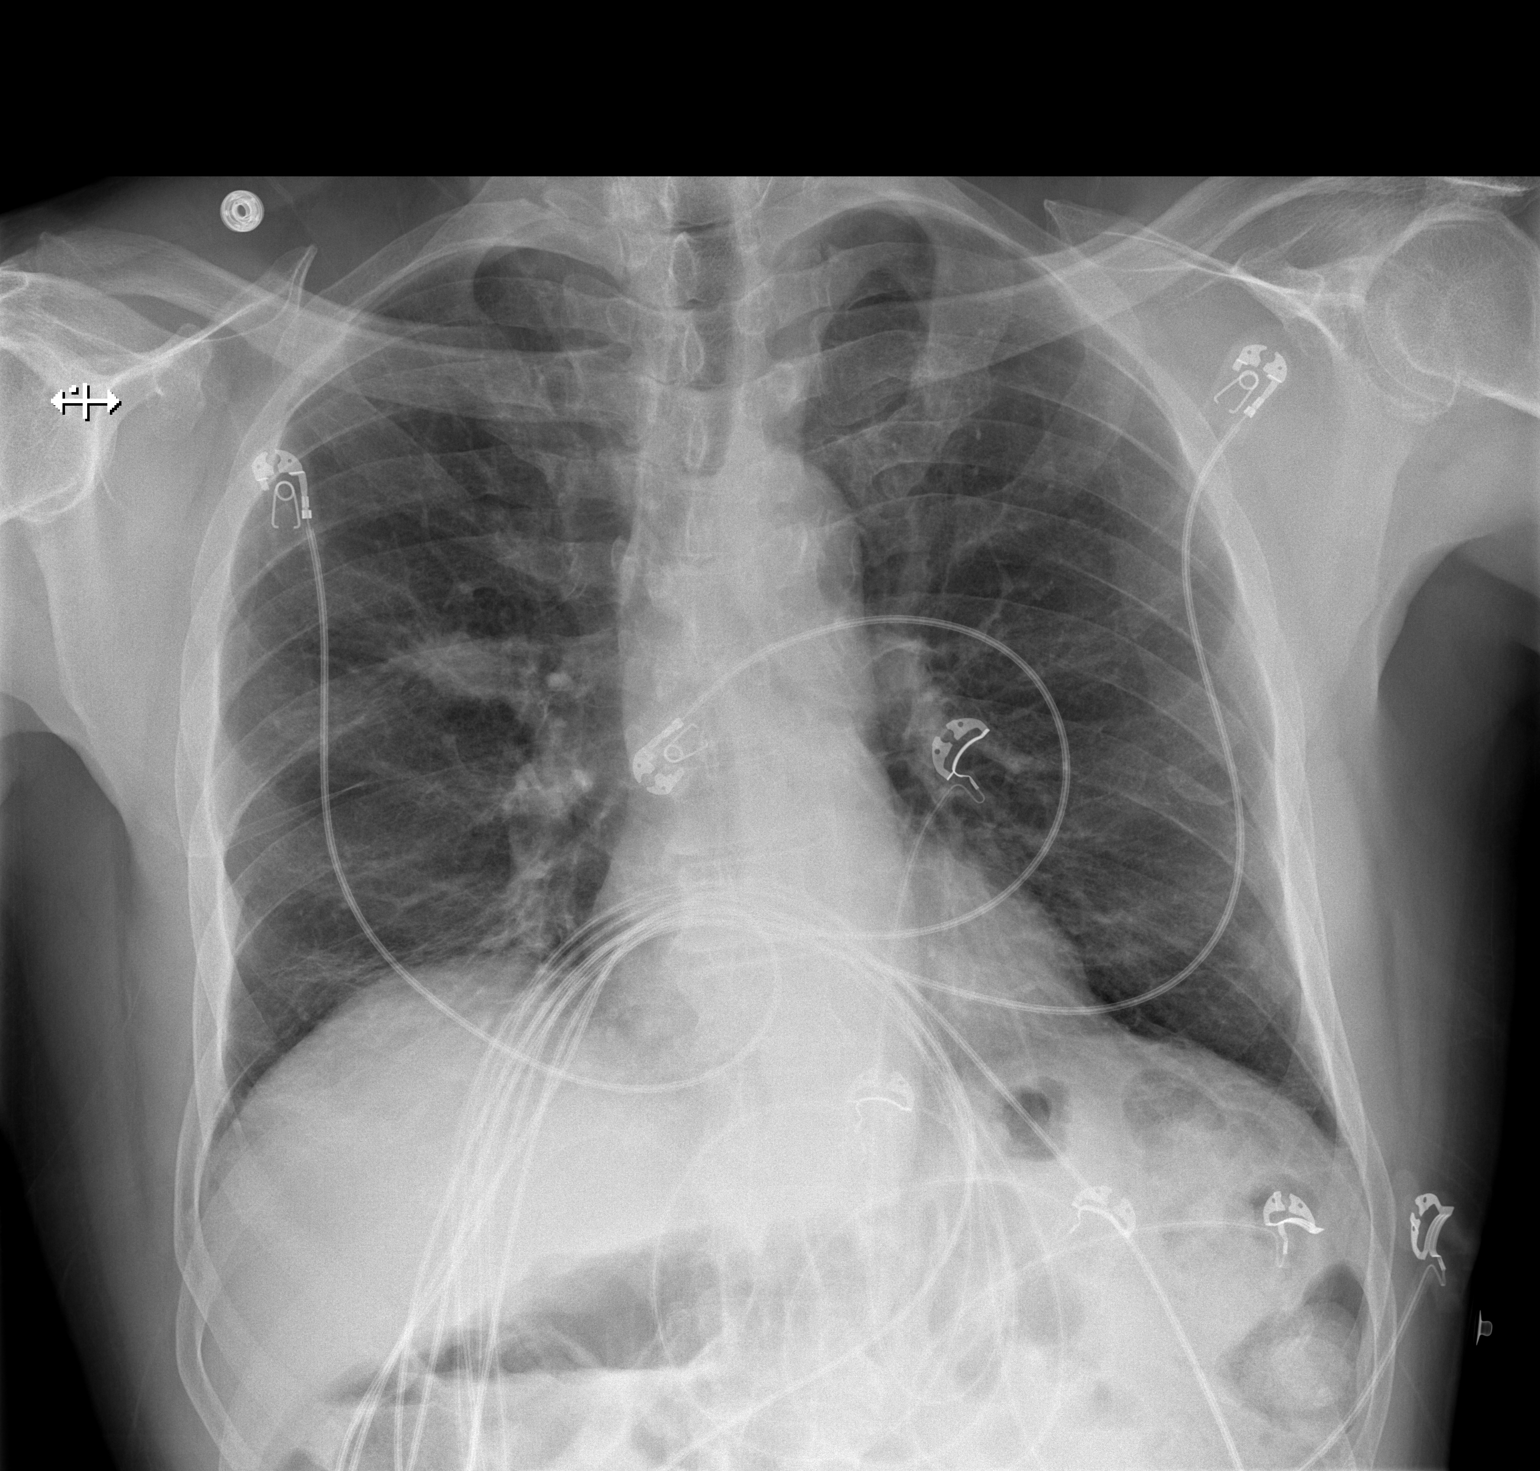

[w chest lat]
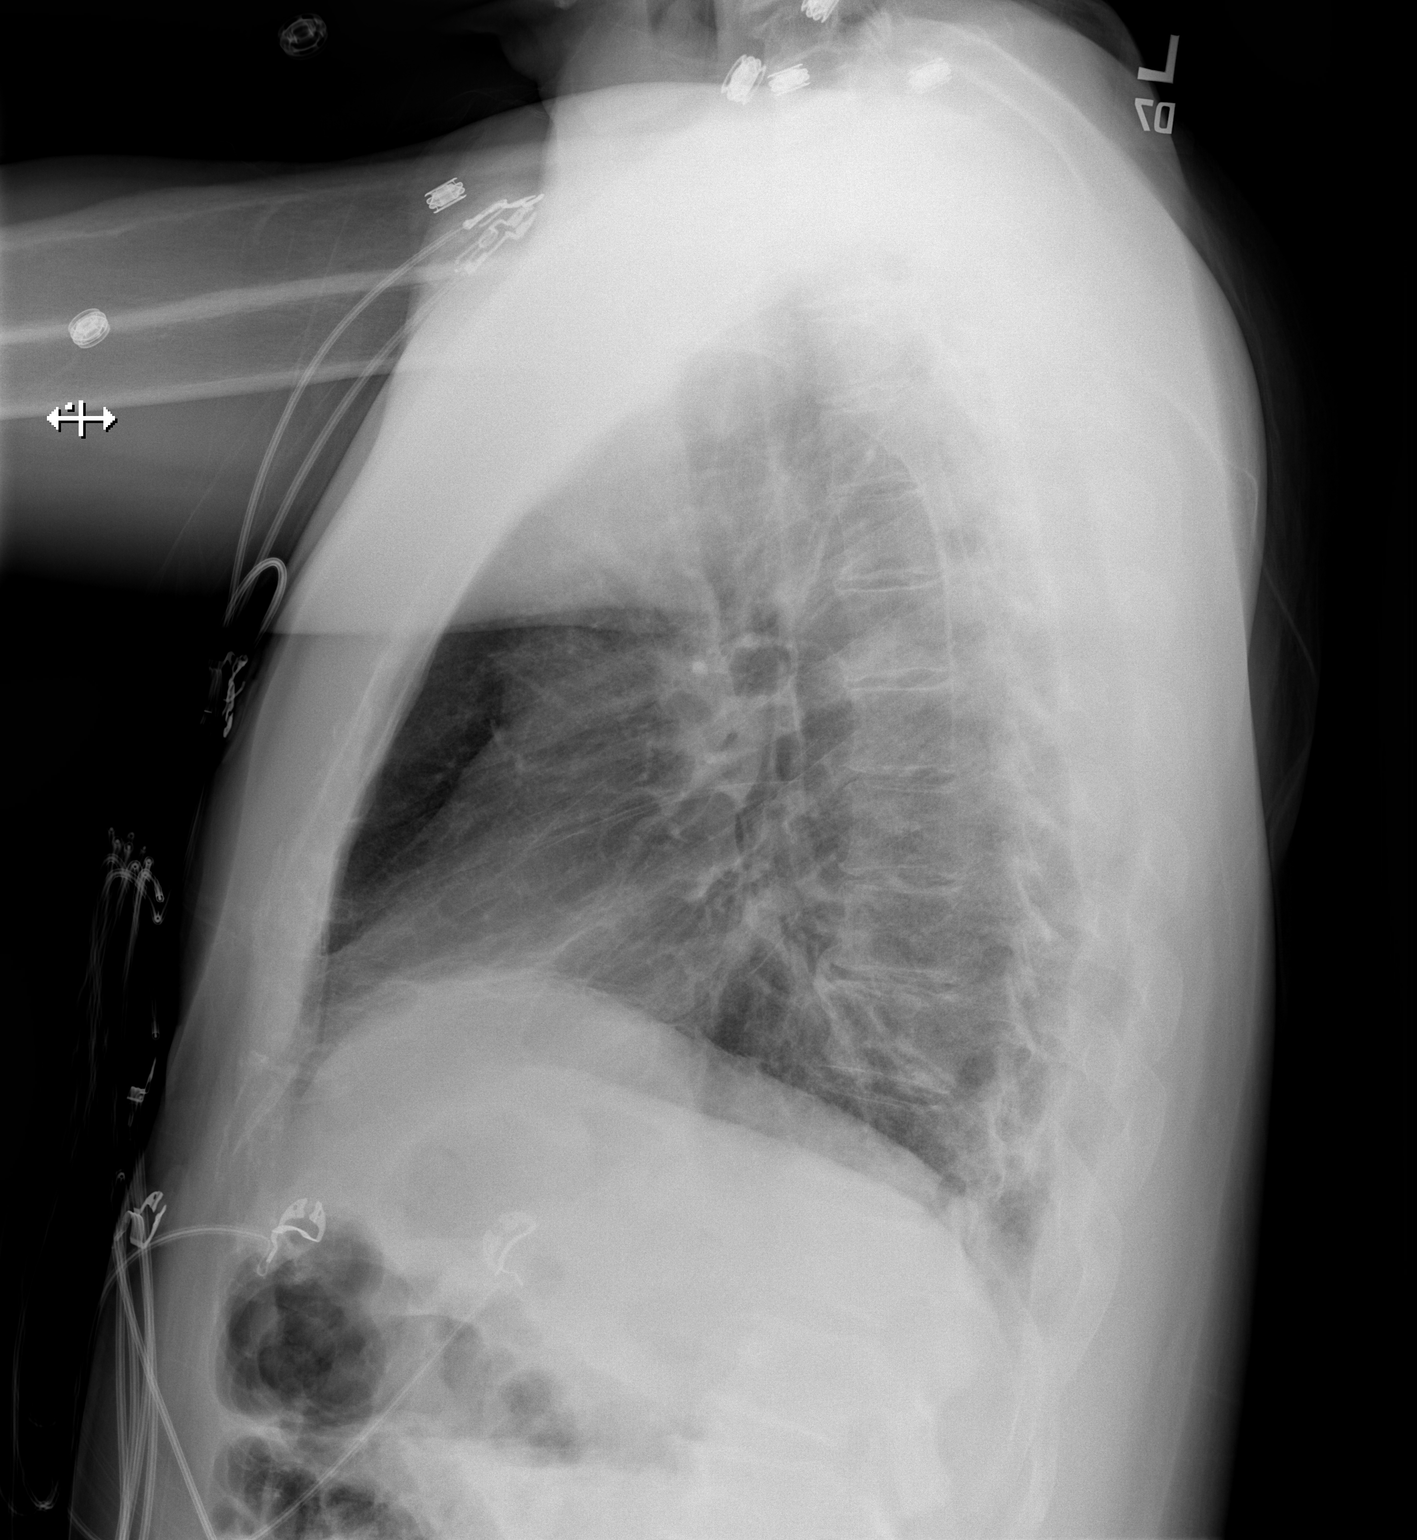

[2 of 2 positions shown; findings below may reference images not displayed]

FINDINGS: Frontal and lateral views of the chest demonstrate an unremarkable
cardiac silhouette. Stable spiculated right upper lobe nodule
consistent with known lung cancer. No acute airspace disease,
effusion, or pneumothorax. No acute bony abnormality.
IMPRESSION: 1. Stable spiculated right upper lobe pulmonary nodule consistent
with known lung cancer.
2. No acute airspace disease.

## 2020-05-24 IMAGING — CT CT ANGIO CHEST
2 of 6 series · 18 of 36 positions shown · IV contrast (omnipaque)
Comparison: [DATE]

CLINICAL DATA: Shortness of breath. Lung cancer. Ongoing
chemotherapy.

EXAM:
CT ANGIOGRAPHY CHEST WITH CONTRAST
TECHNIQUE: Multidetector CT imaging of the chest was performed using the
standard protocol during bolus administration of intravenous
contrast. Multiplanar CT image reconstructions and MIPs were
obtained to evaluate the vascular anatomy.
CONTRAST:  100mL OMNIPAQUE IOHEXOL 350 MG/ML SOLN

[Series 6: thins · axial · 0.62mm/px · z∈[-206,+63]mm · 17 of 303 slices shown]
[im 17/303  lung]
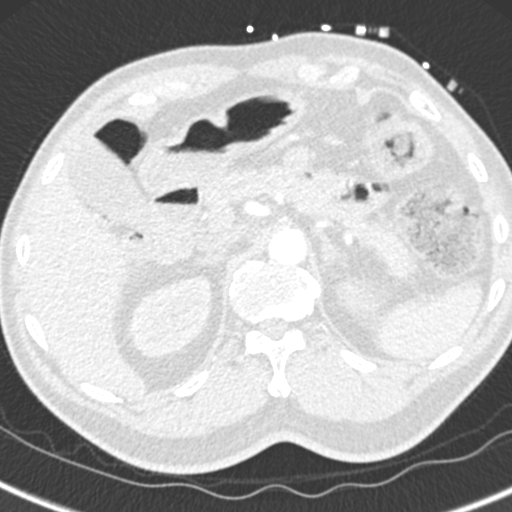
[im 34/303  mediastinal]
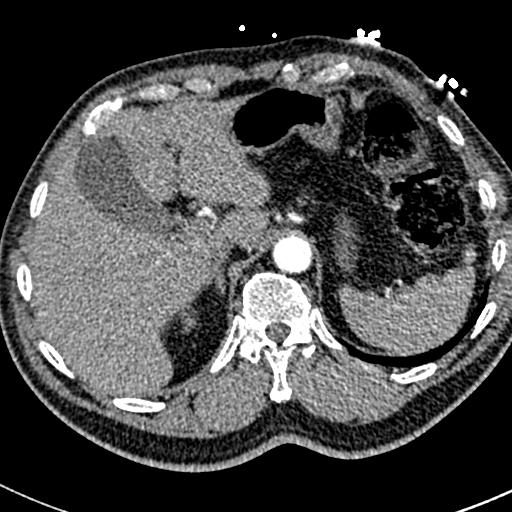
[im 51/303  lung]
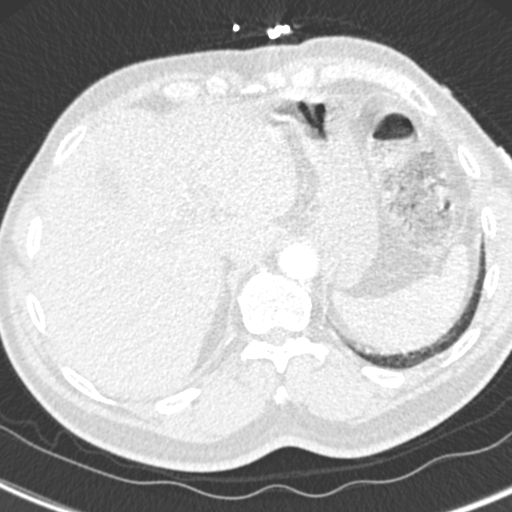
[im 68/303  mediastinal]
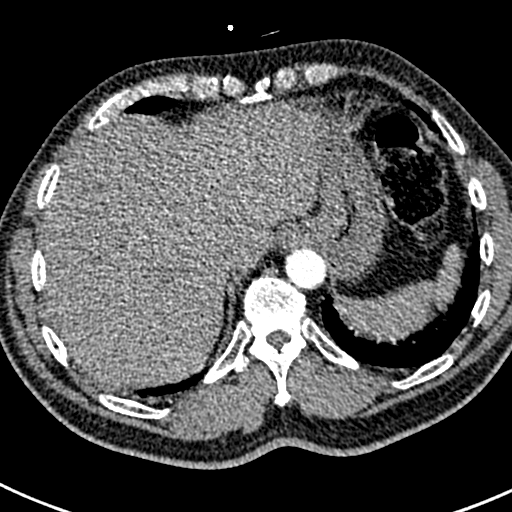
[im 84/303  lung]
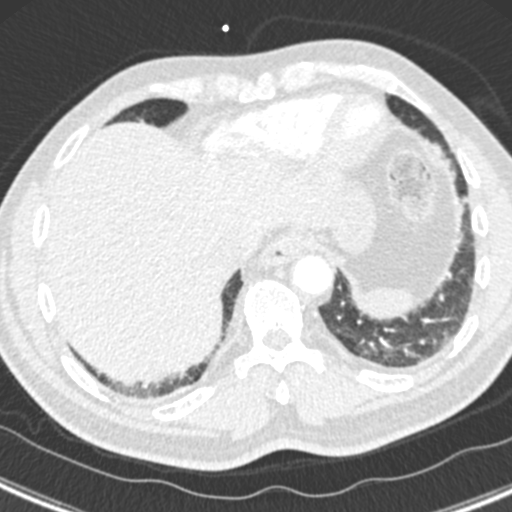
[im 101/303  mediastinal]
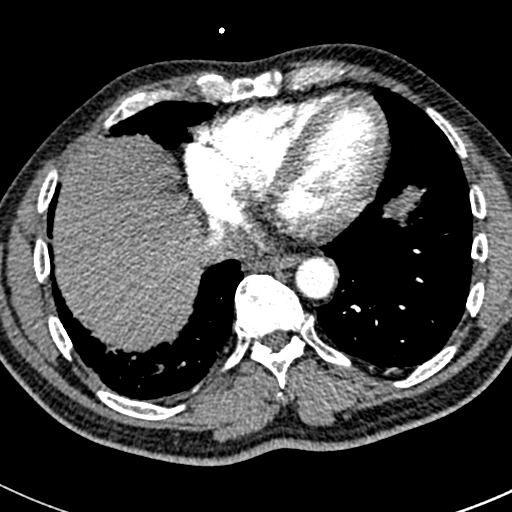
[im 118/303  lung]
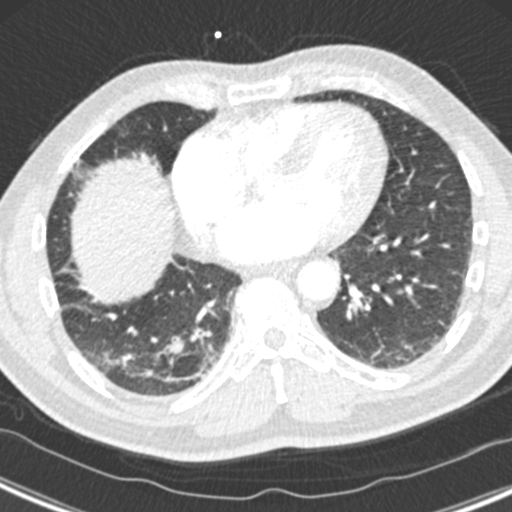
[im 135/303  mediastinal]
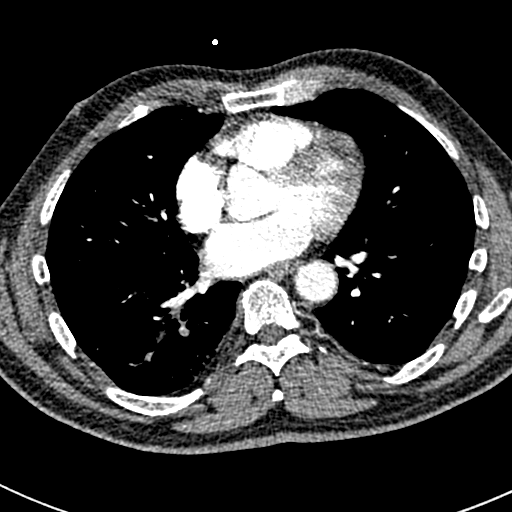
[im 152/303  lung]
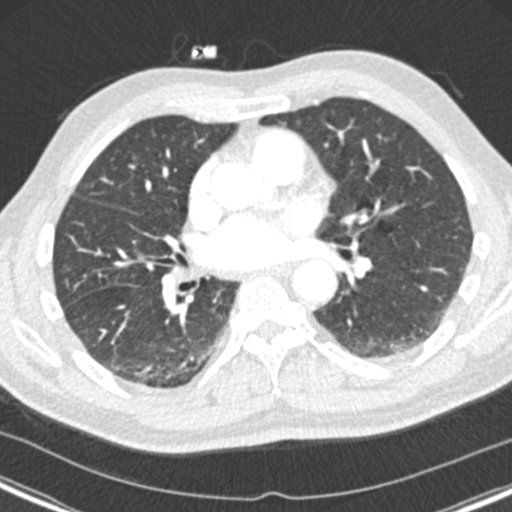
[im 168/303  mediastinal]
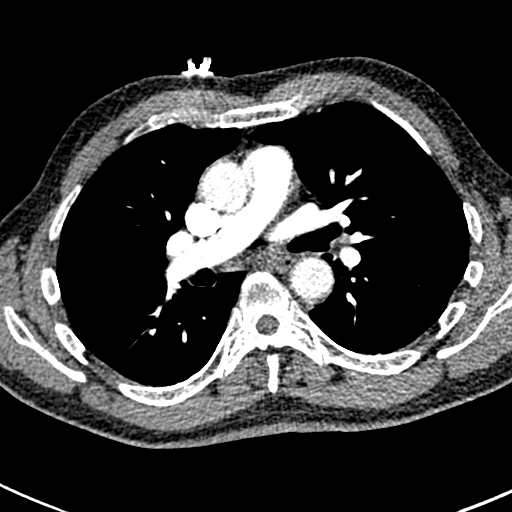
[im 185/303  lung]
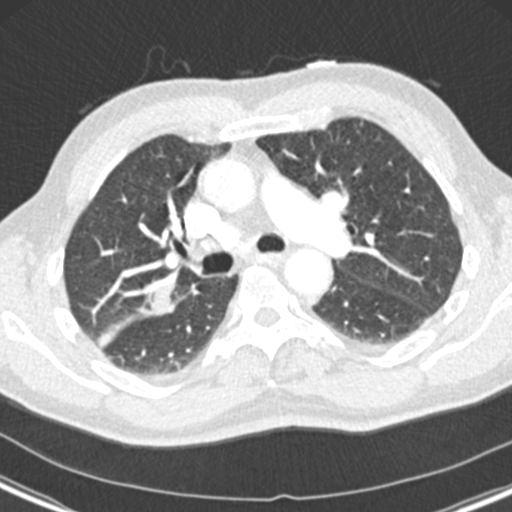
[im 202/303  mediastinal]
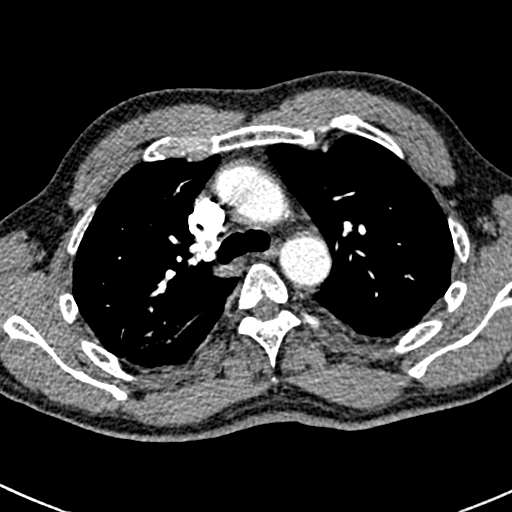
[im 219/303  lung]
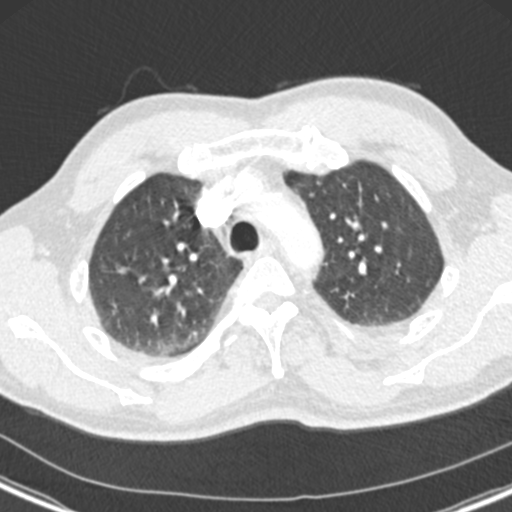
[im 235/303  mediastinal]
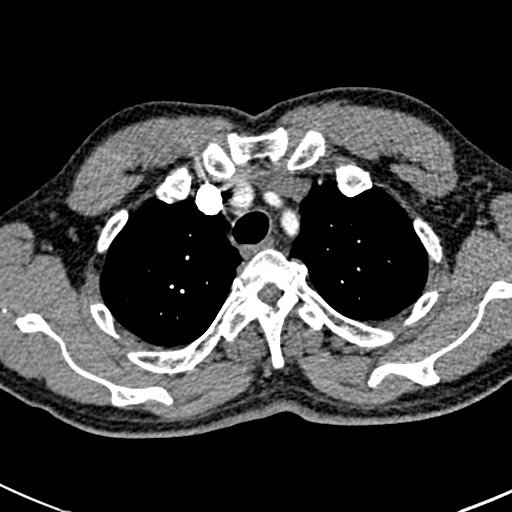
[im 252/303  lung]
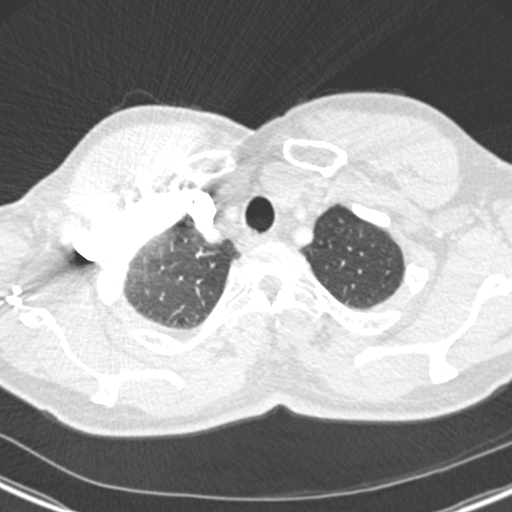
[im 269/303  mediastinal]
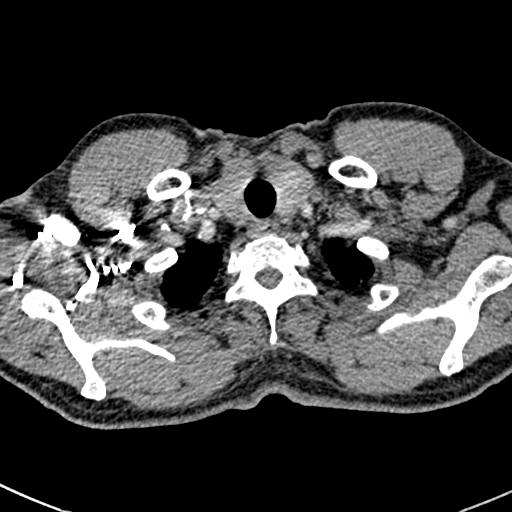
[im 286/303  lung]
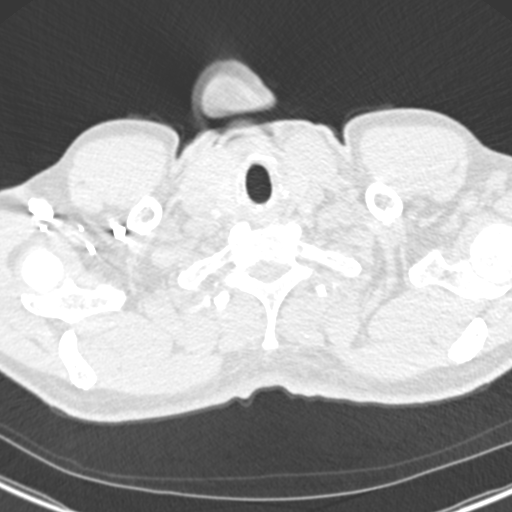

[Series 8: coronal mpr · coronal · 0.60mm/px · 1 of 128 slices shown]
[im 64/128  mediastinal]
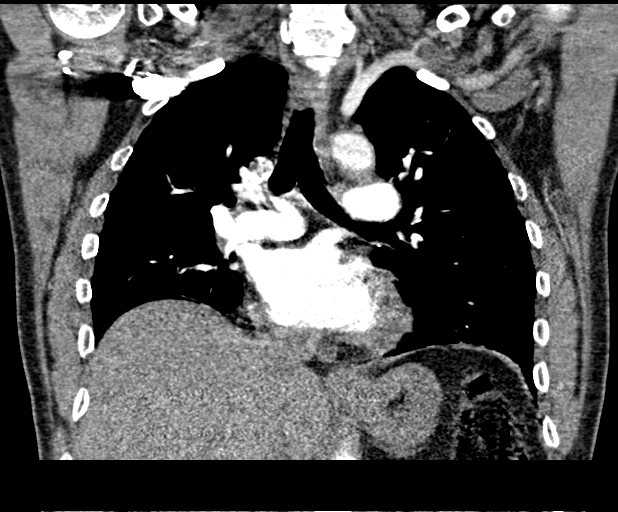

[18 of 36 positions shown; findings below may reference images not displayed]

FINDINGS: Cardiovascular: Filling defects within the right lower lobe
pulmonary arteries posteriorly compatible with pulmonary emboli. No
additional pulmonary emboli. No evidence of right heart strain.
Heart is normal size. Scattered aortic calcifications.

Mediastinum/Nodes: No visible mediastinal, hilar or axillary
adenopathy.

Lungs/Pleura: 2.8 x 2.6 cm mass seen in the posterior right upper
lobe compared to 2.6 x 2.5 cm previously. Increasing opacity noted
peripheral to the nodule/mass which could reflect postobstructive
atelectasis. Scarring in the lung bases. No effusions.

Upper Abdomen: Imaging into the upper abdomen demonstrates no acute
findings.

Musculoskeletal: Chest wall soft tissues are unremarkable. No acute
bony abnormality.

Review of the MIP images confirms the above findings.
IMPRESSION: Right lower lobe pulmonary embolus. No evidence of right heart
strain.

Stable or slightly larger right upper lobe nodule/mass measuring
x 2.6 cm compared with 2.6 x 2.5 cm previously. Increased seen
adjacent peripheral opacity, likely postobstructive atelectasis.

Aortic Atherosclerosis ([KT]-[KT]).

These results were called by telephone at the time of interpretation
on [DATE] at [DATE] to provider STOLZ , who verbally
acknowledged these results.

## 2020-05-24 MED ORDER — MORPHINE SULFATE (PF) 4 MG/ML IV SOLN
4.0000 mg | Freq: Once | INTRAVENOUS | Status: AC
  Administered 2020-05-24: 4 mg via INTRAVENOUS
  Filled 2020-05-24: qty 1

## 2020-05-24 MED ORDER — RIVAROXABAN (XARELTO) EDUCATION KIT FOR DVT/PE PATIENTS
PACK | Freq: Once | Status: AC
  Filled 2020-05-24: qty 1

## 2020-05-24 MED ORDER — ONDANSETRON HCL 4 MG/2ML IJ SOLN
4.0000 mg | Freq: Once | INTRAMUSCULAR | Status: AC
  Administered 2020-05-24: 4 mg via INTRAVENOUS
  Filled 2020-05-24: qty 2

## 2020-05-24 MED ORDER — RIVAROXABAN (XARELTO) VTE STARTER PACK (15 & 20 MG): ORAL_TABLET | ORAL | 0 refills | Status: DC

## 2020-05-24 MED ORDER — ALBUTEROL SULFATE HFA 108 (90 BASE) MCG/ACT IN AERS
2.0000 | INHALATION_SPRAY | Freq: Once | RESPIRATORY_TRACT | Status: AC
  Administered 2020-05-24: 2 via RESPIRATORY_TRACT
  Filled 2020-05-24: qty 6.7

## 2020-05-24 MED ORDER — IOHEXOL 350 MG/ML SOLN
100.0000 mL | Freq: Once | INTRAVENOUS | Status: AC | PRN
  Administered 2020-05-24: 100 mL via INTRAVENOUS

## 2020-05-24 MED ORDER — RIVAROXABAN 15 MG PO TABS
15.0000 mg | ORAL_TABLET | Freq: Once | ORAL | Status: AC
  Administered 2020-05-24: 15 mg via ORAL
  Filled 2020-05-24: qty 1

## 2020-05-24 NOTE — ED Notes (Signed)
Pt has hx of HTN and states he did NOT take his prescribed BP meds today.

## 2020-05-24 NOTE — ED Provider Notes (Signed)
[facility] DEPT Provider Note   CSN: [id number] Arrival date & time: 05/24/20  8478     History Chief Complaint  Patient presents with  . Shortness of Breath  . Flank Pain    Randy Andersen is a 63 y.o. male.  HPI      Randy Andersen is a 64 y.o. male, with a history of neoplasm of the right upper lung, presenting to the ED with right upper quadrant abdominal pain and right lower chest pain beginning last night.  He states shortly after eating a meal of meat loaf, he began to feel pain in the right upper quadrant and the lower ribs.  His pain is waxing and waning, worse with movement, currently moderate in intensity. He also complains of shortness of breath.  He states he thinks his shortness of breath may be due to pain from the abdominal region. He has not been vaccinated for Covid. Denies fever/chills, urinary symptoms, other chest discomfort, cough, dizziness, syncope, N/V/C/D, or any other complaints.   Past Medical History:  Diagnosis Date  . Asthma    as a child  . Chronic pain disorder    LT leg and foot  . Heart murmur    as a child  . Hypertension   . Malignant neoplasm of upper lobe of right lung ([place])   . Stroke [facility])    mini stroke - 2015, some tingling in fingers on right     Patient Active Problem List   Diagnosis Date Noted  . Encounter for antineoplastic chemotherapy 04/14/2020  . Encounter for antineoplastic immunotherapy 04/14/2020  . Goals of care, counseling/discussion 03/31/2020  . Tobacco use 03/17/2020  . Pulmonary nodule 02/19/2020  . Malignant neoplasm of upper lobe of right lung ([place]) 02/19/2020  . Fracture of tibial plateau, closed 10/07/2012  . Fracture of clavicle, left, closed 10/07/2012  . Chronic pain disorder     Past Surgical History:  Procedure Laterality Date  . BUNIONECTOMY Left    had hammer toe surgery also and callus removed  . BUNIONECTOMY Right    also had hammer toe surgery and callus  removed  . LEG SURGERY Left    PT reports he has pins placed in his Lt leg  . ORIF TIBIA PLATEAU  10/09/2012   Dr Lorin Mercy  . ORIF TIBIA PLATEAU Left 10/08/2012   Procedure: OPEN REDUCTION INTERNAL FIXATION (ORIF) TIBIAL PLATEAU;  Surgeon: Marybelle Killings, MD;  Location: [place];  Service: Orthopedics;  Laterality: Left;  Marland Kitchen VIDEO BRONCHOSCOPY WITH ENDOBRONCHIAL NAVIGATION N/A 02/25/2020   Procedure: VIDEO BRONCHOSCOPY WITH ENDOBRONCHIAL NAVIGATION with biopsies;  Surgeon: Collene Gobble, MD;  Location: [facility] OR;  Service: Thoracic;  Laterality: N/A;  . VIDEO BRONCHOSCOPY WITH ENDOBRONCHIAL ULTRASOUND N/A 02/25/2020   Procedure: VIDEO BRONCHOSCOPY WITH ENDOBRONCHIAL ULTRASOUND;  Surgeon: Collene Gobble, MD;  Location: [facility] OR;  Service: Thoracic;  Laterality: N/A;       Family History  Problem Relation Age of Onset  . Cancer Mother        Intestinal metastatic to ovaries    Social History   Tobacco Use  . Smoking status: Current Every Day Smoker    Packs/day: 1.50    Years: 28.00    Pack years: 42.00    Types: Cigarettes  . Smokeless tobacco: Never Used  Vaping Use  . Vaping Use: Never used  Substance Use Topics  . Alcohol use: No  . Drug use: No    Home Medications Prior to  Admission medications   Medication Sig Start Date End Date Taking? Authorizing Provider  amLODipine (NORVASC) 10 MG tablet Take 10 mg by mouth daily.    [provider]  doxylamine, Sleep, (UNISOM) 25 MG tablet Take 25 mg by mouth at bedtime as needed for sleep.     [provider]  folic acid (FOLVITE) 1 MG tablet Take 1 tablet (1 mg total) by mouth daily. 03/31/20   Heilingoetter, Cassandra L, PA-C  HYDROcodone-acetaminophen (NORCO) 10-325 MG tablet Take 1 tablet by mouth 2 (two) times daily.    [provider]  prochlorperazine (COMPAZINE) 10 MG tablet Take 1 tablet (10 mg total) by mouth every 6 (six) hours as needed. 03/31/20   Heilingoetter, Cassandra L, PA-C  RIVAROXABAN (XARELTO) VTE  STARTER PACK (15 & 20 MG TABLETS) Follow package directions: Take one 57m tablet by mouth twice a day. On day 22, switch to one 229mtablet once a day. Take with food. 05/24/20   Guenther Dunshee, ShHelane GuntherPA-C    Allergies    Patient has no known allergies.  Review of Systems   Review of Systems  Constitutional: Negative for chills, diaphoresis and fever.  Respiratory: Positive for shortness of breath.   Cardiovascular: Negative for chest pain and leg swelling.  Gastrointestinal: Positive for abdominal pain. Negative for blood in stool, constipation, diarrhea, nausea and vomiting.  Genitourinary: Negative for dysuria, flank pain, frequency and hematuria.  Musculoskeletal: Negative for back pain.  Neurological: Negative for dizziness, syncope and weakness.  All other systems reviewed and are negative.   Physical Exam Updated Vital Signs BP (!) 135/96   Pulse 80   Temp 98.5 F (36.9 C)   Resp 18   SpO2 99%   Physical Exam Vitals and nursing note reviewed.  Constitutional:      General: He is not in acute distress.    Appearance: He is well-developed. He is not diaphoretic.  HENT:     Head: Normocephalic and atraumatic.     Mouth/Throat:     Mouth: Mucous membranes are moist.     Pharynx: Oropharynx is clear.  Eyes:     Conjunctiva/sclera: Conjunctivae normal.  Cardiovascular:     Rate and Rhythm: Normal rate and regular rhythm.     Pulses: Normal pulses.          Radial pulses are 2+ on the right side and 2+ on the left side.       Posterior tibial pulses are 2+ on the right side and 2+ on the left side.     Heart sounds: Normal heart sounds.     Comments: Tactile temperature in the extremities appropriate and equal bilaterally. Pulmonary:     Effort: Pulmonary effort is normal. No respiratory distress.     Breath sounds: Normal breath sounds.     Comments: No increased work of breathing.  Speaks in full sentences without difficulty. Abdominal:     Palpations: Abdomen is soft.      Tenderness: There is no abdominal tenderness. There is no right CVA tenderness, left CVA tenderness or guarding.     Comments: No abdominal tenderness.  Musculoskeletal:     Cervical back: Neck supple.     Right lower leg: No edema.     Left lower leg: No edema.  Lymphadenopathy:     Cervical: No cervical adenopathy.  Skin:    General: Skin is warm and dry.  Neurological:     Mental Status: He is alert.  Psychiatric:  Mood and Affect: Mood and affect normal.        Speech: Speech normal.        Behavior: Behavior normal.     ED Results / Procedures / Treatments   Labs (all labs ordered are listed, but only abnormal results are displayed) Labs Reviewed  COMPREHENSIVE METABOLIC PANEL - Abnormal; Notable for the following components:      Result Value   Glucose, Bld 110 (*)    Creatinine, Ser 1.31 (*)    Total Protein 8.3 (*)    All other components within normal limits  CBC WITH DIFFERENTIAL/PLATELET - Abnormal; Notable for the following components:   RBC 4.09 (*)    Hemoglobin 12.1 (*)    HCT 36.3 (*)    All other components within normal limits  LIPASE, BLOOD   Hemoglobin  Date Value Ref Range Status  05/24/2020 12.1 (L) 13.0 - 17.0 g/dL Final  05/19/2020 11.8 (L) 13.0 - 17.0 g/dL Final  05/12/2020 11.2 (L) 13.0 - 17.0 g/dL Final  04/28/2020 13.2 13.0 - 17.0 g/dL Final  04/21/2020 13.2 13.0 - 17.0 g/dL Final    EKG EKG Interpretation  Date/Time:  Monday May 24 2020 23:02:44 EDT Ventricular Rate:  87 PR Interval:    QRS Duration: 95 QT Interval:  357 QTC Calculation: 430 R Axis:   58 Text Interpretation: Sinus rhythm Minimal ST depression, inferior leads Baseline wander in lead(s) V2 V4 V6 No STEMI Confirmed by Octaviano Glow [id number]) on 05/24/2020 11:10:17 PM   Radiology DG Chest 2 View  Result Date: 05/24/2020 CLINICAL DATA:  Short of breath, right upper lobe lung cancer, chest pain EXAM: CHEST - 2 VIEW COMPARISON:  02/25/2020, 02/23/2020  FINDINGS: Frontal and lateral views of the chest demonstrate an unremarkable cardiac silhouette. Stable spiculated right upper lobe nodule consistent with known lung cancer. No acute airspace disease, effusion, or pneumothorax. No acute bony abnormality. IMPRESSION: 1. Stable spiculated right upper lobe pulmonary nodule consistent with known lung cancer. 2. No acute airspace disease. Electronically Signed   By: Randa Ngo M.D.   On: 05/24/2020 17:57   CT Angio Chest PE W and/or Wo Contrast  Result Date: 05/24/2020 CLINICAL DATA:  Shortness of breath. Lung cancer. Ongoing chemotherapy. EXAM: CT ANGIOGRAPHY CHEST WITH CONTRAST TECHNIQUE: Multidetector CT imaging of the chest was performed using the standard protocol during bolus administration of intravenous contrast. Multiplanar CT image reconstructions and MIPs were obtained to evaluate the vascular anatomy. CONTRAST:  160m OMNIPAQUE IOHEXOL 350 MG/ML SOLN COMPARISON:  02/23/2020 FINDINGS: Cardiovascular: Filling defects within the right lower lobe pulmonary arteries posteriorly compatible with pulmonary emboli. No additional pulmonary emboli. No evidence of right heart strain. Heart is normal size. Scattered aortic calcifications. Mediastinum/Nodes: No visible mediastinal, hilar or axillary adenopathy. Lungs/Pleura: 2.8 x 2.6 cm mass seen in the posterior right upper lobe compared to 2.6 x 2.5 cm previously. Increasing opacity noted peripheral to the nodule/mass which could reflect postobstructive atelectasis. Scarring in the lung bases. No effusions. Upper Abdomen: Imaging into the upper abdomen demonstrates no acute findings. Musculoskeletal: Chest wall soft tissues are unremarkable. No acute bony abnormality. Review of the MIP images confirms the above findings. IMPRESSION: Right lower lobe pulmonary embolus. No evidence of right heart strain. Stable or slightly larger right upper lobe nodule/mass measuring 2.8 x 2.6 cm compared with 2.6 x 2.5 cm  previously. Increased seen adjacent peripheral opacity, likely postobstructive atelectasis. Aortic Atherosclerosis (ICD10-I70.0). These results were called by telephone at the time of interpretation on  05/24/2020 at 8:41 pm to provider [facility] , who verbally acknowledged these results. Electronically Signed   By: Rolm Baptise M.D.   On: 05/24/2020 20:42   US Abdomen Limited RUQ (LIVER/GB)  Result Date: 05/24/2020 CLINICAL DATA:  Right upper quadrant pain for the past 2 days. EXAM: ULTRASOUND ABDOMEN LIMITED RIGHT UPPER QUADRANT COMPARISON:  CT abdomen pelvis dated October 28, 2013. FINDINGS: Gallbladder: No gallstones or wall thickening visualized. No sonographic Murphy sign noted by sonographer. Common bile duct: Diameter: 4 mm, normal. Liver: No focal lesion identified. Within normal limits in parenchymal echogenicity. Portal vein is patent on color Doppler imaging with normal direction of blood flow towards the liver. Other: None. IMPRESSION: 1. Normal right upper quadrant ultrasound. Electronically Signed   By: Titus Dubin M.D.   On: 05/24/2020 19:42    Procedures Procedures (including critical care time)  Medications Ordered in ED Medications  morphine 4 MG/ML injection 4 mg (4 mg Intravenous Given 05/24/20 1939)  ondansetron (ZOFRAN) injection 4 mg (4 mg Intravenous Given 05/24/20 1937)  iohexol (OMNIPAQUE) 350 MG/ML injection 100 mL (100 mLs Intravenous Contrast Given 05/24/20 2024)  Rivaroxaban (XARELTO) tablet 15 mg (15 mg Oral Given 05/24/20 2249)  rivaroxaban [employer] Reasons) Education Kit for DVT/PE patients ( Does not apply Provided for home use 05/24/20 2249)  albuterol (VENTOLIN HFA) 108 (90 Base) MCG/ACT inhaler 2 puff (2 puffs Inhalation Given 05/24/20 2315)    ED Course  I have reviewed the triage vital signs and the nursing notes.  Pertinent labs & imaging results that were available during my care of the patient were reviewed by me and considered in my medical decision making (see  chart for details).  Clinical Course as of May 25 24  Mon May 24, 2020  2017 64 yo male presenting with acute onset RUQ and r9ight lower chest sharp pain yesterday evening, minutes after eating meatloaf.  Never had this pain before.  It is pleuritic.  On exam he is comfortable appearing. Negative murphy sign, no abdominal ttp, no chest wall or rib ttp.  He feels uncomfortable breathing.  DG chest with no evident PTX or PNA.  RUQ ultrasound negative for biliary pathology.  Pending CT PE study.  Morphine given for pain.  If CT negative, will discharge   [MT]  2309 Subsegmental PE in RLL on CT chest.  Low-moderate risk on PESI score.  He wants to go home, and I think it's reasonable to discharge on PO anticoagulation.  He'll need PCP follow up for DVT scan   [MT]    Clinical Course User Index [MT] Wyvonnia Dusky, MD   MDM Rules/Calculators/A&P                          Patient presents with right lower chest/right upper abdominal pain along with shortness of breath. Patient is nontoxic appearing, afebrile, not tachycardic, not tachypneic, not hypotensive, maintains excellent SPO2 on room air, and is in no apparent distress.   I have reviewed the patient's chart to obtain more information.   I reviewed and interpreted the patient's labs and radiological studies. The patient lab work overall reassuring. CT PE study with evidence of small right lower subsegmental PE. Patient was started on oral anticoagulation.  We spoke with the patient about discharge on oral anticoagulation versus admission to the hospital. Patient has completely normal vital signs, has a small subsegmental PE, and is in no distress.  He is scheduled to follow-up  with his oncologist on November 3.  The patient was given instructions for home care as well as strict return precautions. Patient voices understanding of these instructions, accepts the plan, and is comfortable with discharge.  Findings and plan of care  discussed with Myrtie Cruise, MD. Dr. Langston Masker personally evaluated and examined this patient and closely guided the disposition decision.  Albuterol inhaler provided to the patient upon patient request.  States he has had this medication before and has since run out.  Final Clinical Impression(s) / ED Diagnoses Final diagnoses:  RUQ pain  Single subsegmental pulmonary embolism without acute cor pulmonale ([place])    Rx / DC Orders ED Discharge Orders         Ordered    RIVAROXABAN (XARELTO) VTE STARTER PACK (15 & 20 MG TABLETS)        05/24/20 2122           Lorayne Bender, PA-C 05/25/20 0042    Wyvonnia Dusky, MD 05/25/20 1144

## 2020-05-24 NOTE — Discharge Instructions (Addendum)
Take the rivaroxaban (Xarelto), as prescribed.  Follow-up with your oncologist and primary care provider on this matter.  Return to the emergency department for worsening pain, worsening shortness of breath, dizziness, passing out, or any other major concerns.  Information on my medicine - XARELTO (rivaroxaban)   WHY WAS XARELTO PRESCRIBED FOR YOU? Xarelto was prescribed to treat blood clots that may have been found in the veins of your legs (deep vein thrombosis) or in your lungs (pulmonary embolism) and to reduce the risk of them occurring again.  What do you need to know about Xarelto? The starting dose is one 15 mg tablet taken TWICE daily with food for the FIRST 21 DAYS then on 06/14/2020  the dose is changed to one 20 mg tablet taken ONCE A DAY with your evening meal.  DO NOT stop taking Xarelto without talking to the health care provider who prescribed the medication.  Refill your prescription for 20 mg tablets before you run out.  After discharge, you should have regular check-up appointments with your healthcare provider that is prescribing your Xarelto.  In the future your dose may need to be changed if your kidney function changes by a significant amount.  What do you do if you miss a dose? If you are taking Xarelto TWICE DAILY and you miss a dose, take it as soon as you remember. You may take two 15 mg tablets (total 30 mg) at the same time then resume your regularly scheduled 15 mg twice daily the next day.  If you are taking Xarelto ONCE DAILY and you miss a dose, take it as soon as you remember on the same day then continue your regularly scheduled once daily regimen the next day. Do not take two doses of Xarelto at the same time.   Important Safety Information Xarelto is a blood thinner medicine that can cause bleeding. You should call your healthcare provider right away if you experience any of the following: Bleeding from an injury or your nose that does not  stop. Unusual colored urine (red or dark brown) or unusual colored stools (red or black). Unusual bruising for unknown reasons. A serious fall or if you hit your head (even if there is no bleeding).  Some medicines may interact with Xarelto and might increase your risk of bleeding while on Xarelto. To help avoid this, consult your healthcare provider or pharmacist prior to using any new prescription or non-prescription medications, including herbals, vitamins, non-steroidal anti-inflammatory drugs (NSAIDs) and supplements.  This website has more information on Xarelto: [email or web address]-benson.[email or web address]

## 2020-05-24 NOTE — ED Triage Notes (Signed)
Pt BIB EMS from home. Pt is a lung cancer patient, has been getting chemo tx. Pt c/o SOB starting yesterday. Pt also c/o right sided flank pain starting yesterday.   168/70 CBG 134

## 2020-05-24 NOTE — ED Notes (Signed)
Ultrasound at bedside

## 2020-05-25 ENCOUNTER — Telehealth: Payer: Self-pay

## 2020-05-25 NOTE — Telephone Encounter (Addendum)
Cori Razor, who identifies herself as pt sister LM stating pt was having CP and tightness.  I spoke with Ms. Hassell Done who advised pt was seen in ED and dx with a PE and just wanted to let Dr. Julien Nordmann know. I advised her that he is aware and the ED has taken appropriate action. She expressed understanding of this information.

## 2020-05-26 ENCOUNTER — Inpatient Hospital Stay: Payer: No Typology Code available for payment source | Attending: Internal Medicine | Admitting: Internal Medicine

## 2020-05-26 ENCOUNTER — Other Ambulatory Visit: Payer: Self-pay

## 2020-05-26 ENCOUNTER — Ambulatory Visit: Payer: Self-pay | Admitting: Surgery

## 2020-05-26 DIAGNOSIS — C3411 Malignant neoplasm of upper lobe, right bronchus or lung: Secondary | ICD-10-CM

## 2020-05-26 DIAGNOSIS — Z5111 Encounter for antineoplastic chemotherapy: Secondary | ICD-10-CM | POA: Diagnosis present

## 2020-05-26 DIAGNOSIS — C7931 Secondary malignant neoplasm of brain: Secondary | ICD-10-CM | POA: Insufficient documentation

## 2020-05-26 DIAGNOSIS — Z79899 Other long term (current) drug therapy: Secondary | ICD-10-CM | POA: Diagnosis not present

## 2020-05-26 DIAGNOSIS — Z5112 Encounter for antineoplastic immunotherapy: Secondary | ICD-10-CM | POA: Diagnosis not present

## 2020-05-26 LAB — CMP (CANCER CENTER ONLY)
ALT: 18 U/L (ref 0–44)
AST: 16 U/L (ref 15–41)
Albumin: 3.5 g/dL (ref 3.5–5.0)
Alkaline Phosphatase: 103 U/L (ref 38–126)
Anion gap: 7 (ref 5–15)
BUN: 18 mg/dL (ref 8–23)
CO2: 27 mmol/L (ref 22–32)
Calcium: 9.1 mg/dL (ref 8.9–10.3)
Chloride: 103 mmol/L (ref 98–111)
Creatinine: 1.43 mg/dL — ABNORMAL HIGH (ref 0.61–1.24)
GFR, Estimated: 54 mL/min — ABNORMAL LOW (ref >60)
Glucose, Bld: 127 mg/dL — ABNORMAL HIGH (ref 70–99)
Potassium: 3.9 mmol/L (ref 3.5–5.1)
Sodium: 137 mmol/L (ref 135–145)
Total Bilirubin: 0.7 mg/dL (ref 0.3–1.2)
Total Protein: 7.4 g/dL (ref 6.5–8.1)

## 2020-05-26 LAB — CBC WITH DIFFERENTIAL (CANCER CENTER ONLY)
Abs Immature Granulocytes: 0.01 10*3/uL (ref 0.00–0.07)
Basophils Absolute: 0 10*3/uL (ref 0.0–0.1)
Basophils Relative: 1 %
Eosinophils Absolute: 0 10*3/uL (ref 0.0–0.5)
Eosinophils Relative: 1 %
HCT: 32 % — ABNORMAL LOW (ref 39.0–52.0)
Hemoglobin: 10.8 g/dL — ABNORMAL LOW (ref 13.0–17.0)
Immature Granulocytes: 1 %
Lymphocytes Relative: 45 %
Lymphs Abs: 1 10*3/uL (ref 0.7–4.0)
MCH: 29 pg (ref 26.0–34.0)
MCHC: 33.8 g/dL (ref 30.0–36.0)
MCV: 85.8 fL (ref 80.0–100.0)
Monocytes Absolute: 0.2 10*3/uL (ref 0.1–1.0)
Monocytes Relative: 9 %
Neutro Abs: 0.9 10*3/uL — ABNORMAL LOW (ref 1.7–7.7)
Neutrophils Relative %: 43 %
Platelet Count: 160 10*3/uL (ref 150–400)
RBC: 3.73 MIL/uL — ABNORMAL LOW (ref 4.22–5.81)
RDW: 14.8 % (ref 11.5–15.5)
WBC Count: 2.1 10*3/uL — ABNORMAL LOW (ref 4.0–10.5)
nRBC: 0 % (ref 0.0–0.2)

## 2020-05-31 ENCOUNTER — Other Ambulatory Visit: Payer: Self-pay

## 2020-05-31 ENCOUNTER — Ambulatory Visit (HOSPITAL_COMMUNITY)
Admission: EM | Admit: 2020-05-31 | Discharge: 2020-05-31 | Disposition: A | Discharge status: Home / Self Care | Payer: No Typology Code available for payment source | Attending: Internal Medicine | Admitting: Internal Medicine

## 2020-05-31 DIAGNOSIS — R310 Gross hematuria: Secondary | ICD-10-CM

## 2020-05-31 LAB — POCT URINALYSIS DIPSTICK, ED / UC
Glucose, UA: NEGATIVE mg/dL
Leukocytes,Ua: NEGATIVE
Nitrite: NEGATIVE
Protein, ur: NEGATIVE mg/dL
Specific Gravity, Urine: 1.02 (ref 1.005–1.030)
Urobilinogen, UA: 1 mg/dL (ref 0.0–1.0)
pH: 5.5 (ref 5.0–8.0)

## 2020-05-31 LAB — PSA: Prostatic Specific Antigen: 1.48 ng/mL (ref 0.00–4.00)

## 2020-05-31 MED ORDER — PHENAZOPYRIDINE HCL 200 MG PO TABS: 200.0000 mg | ORAL_TABLET | Freq: Three times a day (TID) | ORAL | 0 refills | Status: DC

## 2020-05-31 MED ORDER — TAMSULOSIN HCL 0.4 MG PO CAPS
0.4000 mg | ORAL_CAPSULE | Freq: Every day | ORAL | 2 refills | Status: DC
Start: 2020-05-31 — End: 2020-06-01

## 2020-05-31 NOTE — Discharge Instructions (Addendum)
Please take the medications as prescribed Please follow-up with your primary care physician to set you up with a urology visit. If you develop lower abdominal pain, inability to void-please go to the emergency department to be evaluated.

## 2020-05-31 NOTE — ED Triage Notes (Signed)
Pt seen at [facility] ed on Friday. Pt reported to ed he had dysuria but it was not treated for UTI. Pt wants to be checked for UTI.

## 2020-06-01 ENCOUNTER — Telehealth (HOSPITAL_COMMUNITY): Payer: Self-pay

## 2020-06-01 ENCOUNTER — Telehealth (HOSPITAL_COMMUNITY): Payer: Self-pay | Admitting: Internal Medicine

## 2020-06-01 MED ORDER — TAMSULOSIN HCL 0.4 MG PO CAPS: 0.4000 mg | ORAL_CAPSULE | Freq: Every day | ORAL | 2 refills | Status: AC

## 2020-06-01 NOTE — Telephone Encounter (Signed)
Medications resent to [facility] in [place], [place]

## 2020-06-01 NOTE — ED Provider Notes (Signed)
[place]    CSN: [id number] Arrival date & time: 05/31/20  1506      History   Chief Complaint Chief Complaint  Patient presents with  . Urinary Tract Infection  . Dysuria    HPI Randy Andersen is a 64 y.o. male comes to urgent care with complaints of bloody urine with some dysuria which started a couple of days ago.  Patient was recently started on Xarelto for pulmonary embolism.  A couple of days after the patient started noticing some bloody urine with blood clots at the onset of micturition.  He denies any fever, chills, flank pain or lower abdominal pain.  Patient has had some hesitancy, nocturia and straining at micturition over the past several months.  No weight loss.  Patient denies any history of prostate disease.   HPI  Past Medical History:  Diagnosis Date  . Asthma    as a child  . Chronic pain disorder    LT leg and foot  . Heart murmur    as a child  . Hypertension   . Malignant neoplasm of upper lobe of right lung ([place])   . Stroke [facility])    mini stroke - 2015, some tingling in fingers on right     Patient Active Problem List   Diagnosis Date Noted  . Encounter for antineoplastic chemotherapy 04/14/2020  . Encounter for antineoplastic immunotherapy 04/14/2020  . Goals of care, counseling/discussion 03/31/2020  . Tobacco use 03/17/2020  . Pulmonary nodule 02/19/2020  . Malignant neoplasm of upper lobe of right lung ([place]) 02/19/2020  . Fracture of tibial plateau, closed 10/07/2012  . Fracture of clavicle, left, closed 10/07/2012  . Chronic pain disorder     Past Surgical History:  Procedure Laterality Date  . BUNIONECTOMY Left    had hammer toe surgery also and callus removed  . BUNIONECTOMY Right    also had hammer toe surgery and callus removed  . LEG SURGERY Left    PT reports he has pins placed in his Lt leg  . ORIF TIBIA PLATEAU  10/09/2012   Dr Lorin Mercy  . ORIF TIBIA PLATEAU Left 10/08/2012   Procedure: OPEN REDUCTION INTERNAL  FIXATION (ORIF) TIBIAL PLATEAU;  Surgeon: Marybelle Killings, MD;  Location: [place];  Service: Orthopedics;  Laterality: Left;  Marland Kitchen VIDEO BRONCHOSCOPY WITH ENDOBRONCHIAL NAVIGATION N/A 02/25/2020   Procedure: VIDEO BRONCHOSCOPY WITH ENDOBRONCHIAL NAVIGATION with biopsies;  Surgeon: Collene Gobble, MD;  Location[place];  Service: Thoracic;  Laterality: N/A;  . VIDEO BRONCHOSCOPY WITH ENDOBRONCHIAL ULTRASOUND N/A 02/25/2020   Procedure: VIDEO BRONCHOSCOPY WITH ENDOBRONCHIAL ULTRASOUND;  Surgeon: Collene Gobble, MD;  Location: [facility];  Service: Thoracic;  Laterality: N/A;       Home Medications    Prior to Admission medications   Medication Sig Start Date End Date Taking? Authorizing Provider  HYDROcodone-acetaminophen (NORCO) 10-325 MG tablet Take 1 tablet by mouth 2 (two) times daily.   Yes [provider]  prochlorperazine (COMPAZINE) 10 MG tablet Take 1 tablet (10 mg total) by mouth every 6 (six) hours as needed. 03/31/20  Yes Heilingoetter, Cassandra L, PA-C  RIVAROXABAN (XARELTO) VTE STARTER PACK (15 & 20 MG TABLETS) Follow package directions: Take one 15mg  tablet by mouth twice a day. On day 22, switch to one 20mg  tablet once a day. Take with food. 05/24/20  Yes Joy, Shawn C, PA-C  amLODipine (NORVASC) 10 MG tablet Take 10 mg by mouth daily.    [provider]  doxylamine, Sleep, (UNISOM) 25 MG tablet Take 25 mg by mouth at bedtime as needed for sleep.     [provider]  folic acid (FOLVITE) 1 MG tablet Take 1 tablet (1 mg total) by mouth daily. 03/31/20   Heilingoetter, Cassandra L, PA-C  phenazopyridine (PYRIDIUM) 200 MG tablet Take 1 tablet (200 mg total) by mouth 3 (three) times daily. 05/31/20   Romone Shaff, Myrene Galas, MD  tamsulosin (FLOMAX) 0.4 MG CAPS capsule Take 1 capsule (0.4 mg total) by mouth daily. 06/01/20   LampteyMyrene Galas, MD    Family History Family History  Problem Relation Age of Onset  . Cancer Mother        Intestinal metastatic to ovaries    Social  History Social History   Tobacco Use  . Smoking status: Current Every Day Smoker    Packs/day: 1.50    Years: 28.00    Pack years: 42.00    Types: Cigarettes  . Smokeless tobacco: Never Used  Vaping Use  . Vaping Use: Never used  Substance Use Topics  . Alcohol use: No  . Drug use: No     Allergies   Patient has no known allergies.   Review of Systems Review of Systems  Constitutional: Negative.   Eyes: Negative.   Respiratory: Negative.  Negative for cough, shortness of breath and wheezing.   Gastrointestinal: Negative.   Genitourinary: Negative.   Neurological: Negative.  Negative for weakness and light-headedness.     Physical Exam Triage Vital Signs ED Triage Vitals  Enc Vitals Group     BP 05/31/20 1627 (!) 147/87     Pulse Rate 05/31/20 1627 81     Resp 05/31/20 1627 18     Temp 05/31/20 1627 98 F (36.7 C)     Temp Source 05/31/20 1627 Oral     SpO2 05/31/20 1627 96 %     Weight 05/31/20 1630 185 lb (83.9 kg)     Height 05/31/20 1630 6\' 2"  (1.88 m)     Head Circumference --      Peak Flow --      Pain Score 05/31/20 1629 0     Pain Loc --      Pain Edu? --      Excl. in [place]? --    No data found.  Updated Vital Signs BP (!) 147/87 (BP Location: Left Arm)   Pulse 81   Temp 98 F (36.7 C) (Oral)   Resp 18   Ht 6\' 2"  (1.88 m)   Wt 83.9 kg   SpO2 96%   BMI 23.75 kg/m   Visual Acuity Right Eye Distance:   Left Eye Distance:   Bilateral Distance:    Right Eye Near:   Left Eye Near:    Bilateral Near:     Physical Exam Vitals and nursing note reviewed.  Constitutional:      General: He is not in acute distress.    Appearance: He is not ill-appearing.  Cardiovascular:     Rate and Rhythm: Normal rate and regular rhythm.  Pulmonary:     Effort: Pulmonary effort is normal.     Breath sounds: Normal breath sounds.  Abdominal:     General: Bowel sounds are normal.     Palpations: Abdomen is soft.  Musculoskeletal:        General: No  swelling or tenderness. Normal range of motion.  Neurological:     Mental Status: He is alert.  UC Treatments / Results  Labs (all labs ordered are listed, but only abnormal results are displayed) Labs Reviewed  POCT URINALYSIS DIPSTICK, ED / UC - Abnormal; Notable for the following components:      Result Value   Bilirubin Urine SMALL (*)    Ketones, ur TRACE (*)    Hgb urine dipstick LARGE (*)    All other components within normal limits  PSA    EKG   Radiology No results found.  Procedures Procedures (including critical care time)  Medications Ordered in UC Medications - No data to display  Initial Impression / Assessment and Plan / UC Course  I have reviewed the triage vital signs and the nursing notes.  Pertinent labs & imaging results that were available during my care of the patient were reviewed by me and considered in my medical decision making (see chart for details).     1.  Hematuria: Point-of-care urinalysis is remarkable for large hemoglobin, trace ketones and bilirubin I suspect the hematuria is secondary to prostatic bleeding following anticoagulation. Patient denies any previous prostatic evaluation PSA today Patient is hemodynamically stable so anemia is less likely Patient is advised to follow-up with the primary care physician for monitoring. Tamsulosin 0.4 mg orally daily Pyridium as needed for dysuria  2.  Pulmonary embolism on Xarelto: Patient is counseled to be compliant with this medications Return precautions given. Final Clinical Impressions(s) / UC Diagnoses   Final diagnoses:  Hematuria, gross     Discharge Instructions     Please take the medications as prescribed Please follow-up with your primary care physician to set you up with a urology visit. If you develop lower abdominal pain, inability to void-please go to the emergency department to be evaluated.   ED Prescriptions    Medication Sig Dispense Auth. Provider    phenazopyridine (PYRIDIUM) 200 MG tablet Take 1 tablet (200 mg total) by mouth 3 (three) times daily. 6 tablet Jullian Previti, Myrene Galas, MD   tamsulosin (FLOMAX) 0.4 MG CAPS capsule Take 1 capsule (0.4 mg total) by mouth daily. 30 capsule Tija Biss, Myrene Galas, MD     PDMP not reviewed this encounter.   Chase Picket, MD 06/01/20 [id number]

## 2020-06-02 ENCOUNTER — Inpatient Hospital Stay: Payer: No Typology Code available for payment source

## 2020-06-02 ENCOUNTER — Other Ambulatory Visit: Payer: Self-pay

## 2020-06-02 DIAGNOSIS — C3411 Malignant neoplasm of upper lobe, right bronchus or lung: Secondary | ICD-10-CM

## 2020-06-02 DIAGNOSIS — Z5112 Encounter for antineoplastic immunotherapy: Secondary | ICD-10-CM | POA: Diagnosis not present

## 2020-06-02 LAB — CBC WITH DIFFERENTIAL (CANCER CENTER ONLY)
Abs Immature Granulocytes: 0 10*3/uL (ref 0.00–0.07)
Basophils Absolute: 0 10*3/uL (ref 0.0–0.1)
Basophils Relative: 0 %
Eosinophils Absolute: 0 10*3/uL (ref 0.0–0.5)
Eosinophils Relative: 1 %
HCT: 31.4 % — ABNORMAL LOW (ref 39.0–52.0)
Hemoglobin: 10.3 g/dL — ABNORMAL LOW (ref 13.0–17.0)
Immature Granulocytes: 0 %
Lymphocytes Relative: 40 %
Lymphs Abs: 1.3 10*3/uL (ref 0.7–4.0)
MCH: 29 pg (ref 26.0–34.0)
MCHC: 32.8 g/dL (ref 30.0–36.0)
MCV: 88.5 fL (ref 80.0–100.0)
Monocytes Absolute: 0.7 10*3/uL (ref 0.1–1.0)
Monocytes Relative: 24 %
Neutro Abs: 1.1 10*3/uL — ABNORMAL LOW (ref 1.7–7.7)
Neutrophils Relative %: 35 %
Platelet Count: 153 10*3/uL (ref 150–400)
RBC: 3.55 MIL/uL — ABNORMAL LOW (ref 4.22–5.81)
RDW: 15.8 % — ABNORMAL HIGH (ref 11.5–15.5)
WBC Count: 3.1 10*3/uL — ABNORMAL LOW (ref 4.0–10.5)
nRBC: 0 % (ref 0.0–0.2)

## 2020-06-02 LAB — CMP (CANCER CENTER ONLY)
ALT: 25 U/L (ref 0–44)
AST: 21 U/L (ref 15–41)
Albumin: 3.6 g/dL (ref 3.5–5.0)
Alkaline Phosphatase: 102 U/L (ref 38–126)
Anion gap: 8 (ref 5–15)
BUN: 19 mg/dL (ref 8–23)
CO2: 26 mmol/L (ref 22–32)
Calcium: 9.4 mg/dL (ref 8.9–10.3)
Chloride: 108 mmol/L (ref 98–111)
Creatinine: 1.97 mg/dL — ABNORMAL HIGH (ref 0.61–1.24)
GFR, Estimated: 37 mL/min — ABNORMAL LOW (ref >60)
Glucose, Bld: 86 mg/dL (ref 70–99)
Potassium: 4.4 mmol/L (ref 3.5–5.1)
Sodium: 142 mmol/L (ref 135–145)
Total Bilirubin: 0.3 mg/dL (ref 0.3–1.2)
Total Protein: 7.4 g/dL (ref 6.5–8.1)

## 2020-06-02 LAB — TSH: TSH: 1.467 u[IU]/mL (ref 0.320–4.118)

## 2020-06-07 ENCOUNTER — Other Ambulatory Visit: Payer: Self-pay | Admitting: Internal Medicine

## 2020-06-07 ENCOUNTER — Telehealth: Payer: Self-pay | Admitting: *Deleted

## 2020-06-07 MED ORDER — TEMAZEPAM 15 MG PO CAPS: 15.0000 mg | ORAL_CAPSULE | Freq: Every evening | ORAL | 0 refills | Status: DC | PRN

## 2020-06-07 NOTE — Telephone Encounter (Signed)
I sent prescription for Restoril to his pharmacy.  Thank you.

## 2020-06-07 NOTE — Telephone Encounter (Signed)
Received vm call from Friday with pt asking for something for sleep.  He states he just can't sleep.  He is asking for Lorrin Mais b/c it worked for him in past.  He states over the counter med did not help-unisom.  [facility] gave [facility] something that was too strong but he doesn't know what it was.  Message to Dr Julien Nordmann.

## 2020-06-08 ENCOUNTER — Ambulatory Visit (HOSPITAL_COMMUNITY)
Admission: RE | Admit: 2020-06-08 | Discharge: 2020-06-08 | Disposition: A | Discharge status: Home / Self Care | Payer: No Typology Code available for payment source | Source: Ambulatory Visit | Attending: Internal Medicine | Admitting: Internal Medicine

## 2020-06-08 ENCOUNTER — Ambulatory Visit (HOSPITAL_COMMUNITY): Payer: No Typology Code available for payment source

## 2020-06-08 ENCOUNTER — Other Ambulatory Visit: Payer: Self-pay | Admitting: Internal Medicine

## 2020-06-08 ENCOUNTER — Other Ambulatory Visit: Payer: Self-pay

## 2020-06-08 ENCOUNTER — Other Ambulatory Visit: Payer: Self-pay | Admitting: Radiation Therapy

## 2020-06-08 DIAGNOSIS — C7931 Secondary malignant neoplasm of brain: Secondary | ICD-10-CM

## 2020-06-08 DIAGNOSIS — C3490 Malignant neoplasm of unspecified part of unspecified bronchus or lung: Secondary | ICD-10-CM | POA: Diagnosis not present

## 2020-06-08 DIAGNOSIS — C7949 Secondary malignant neoplasm of other parts of nervous system: Secondary | ICD-10-CM

## 2020-06-08 IMAGING — CT CT ABD-PELV W/O CM
2 of 4 series · 12 of 36 positions shown, 15 images · non-contrast
Comparison: CT chest angiogram [DATE]. [DATE] CT
abdomen/pelvis per

CLINICAL DATA: Primary Cancer Type: Lung
TECHNIQUE: Multidetector CT imaging of the chest, abdomen and pelvis was
performed following the standard protocol without IV contrast.

[Series 2: cap w/o · axial · non-contrast · 0.88mm/px · z∈[-379,+156]mm · 9 of 135 slices shown, 12 images]
[im 14/135  mediastinal]
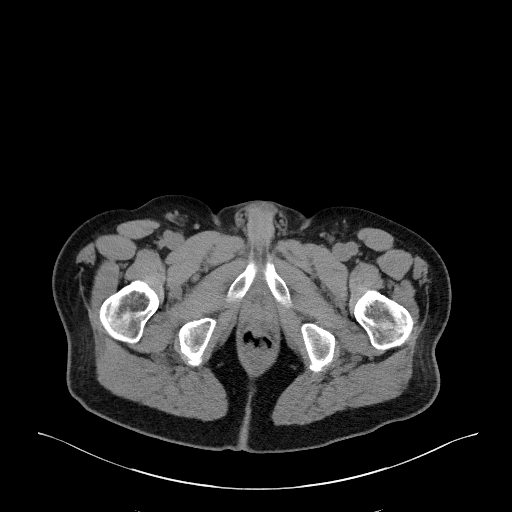
[im 14/135  lung]
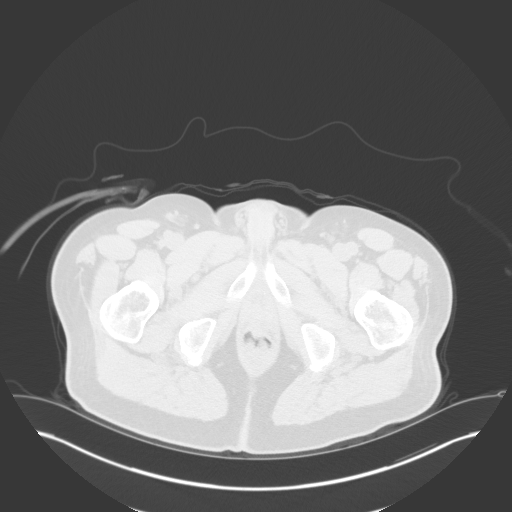
[im 27/135  lung]
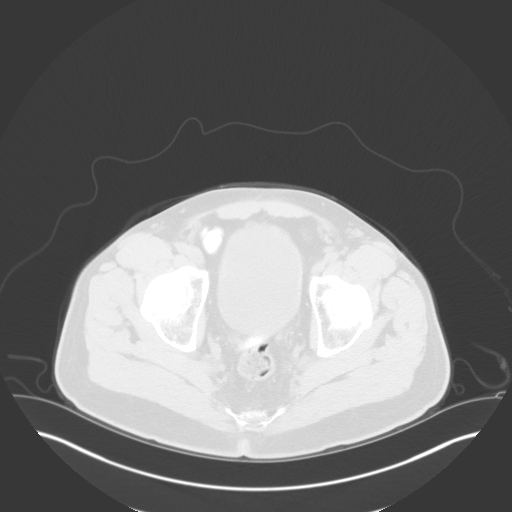
[im 41/135  lung]
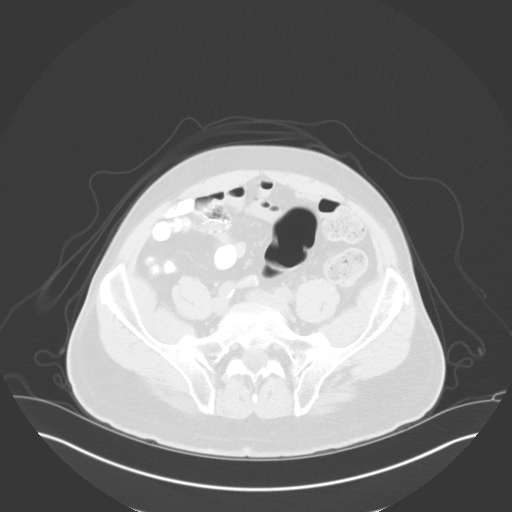
[im 54/135  lung]
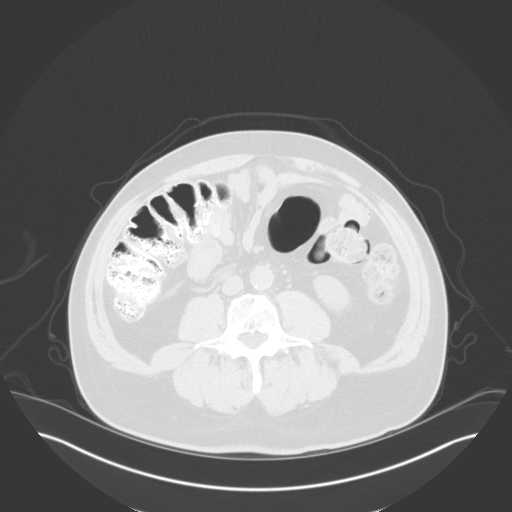
[im 68/135  mediastinal]
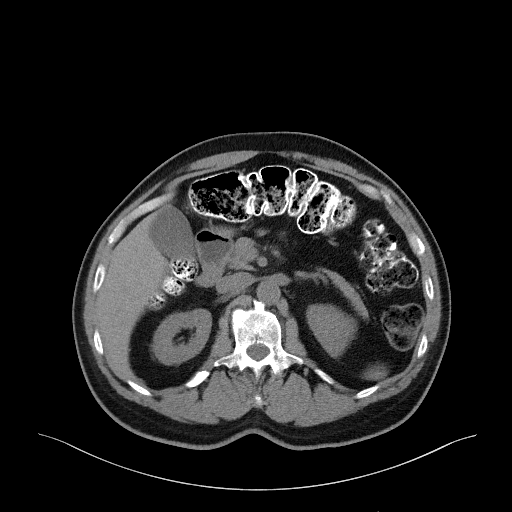
[im 68/135  lung]
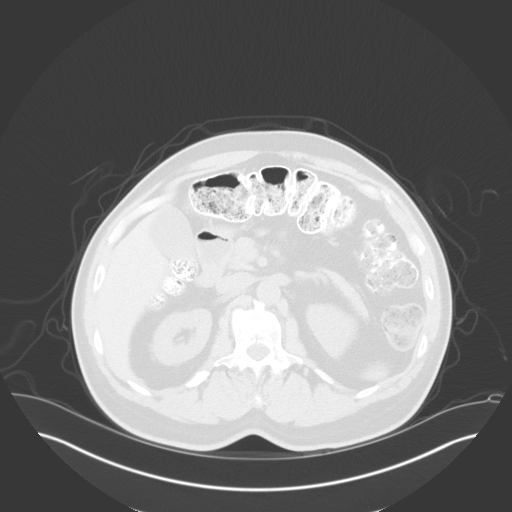
[im 81/135  lung]
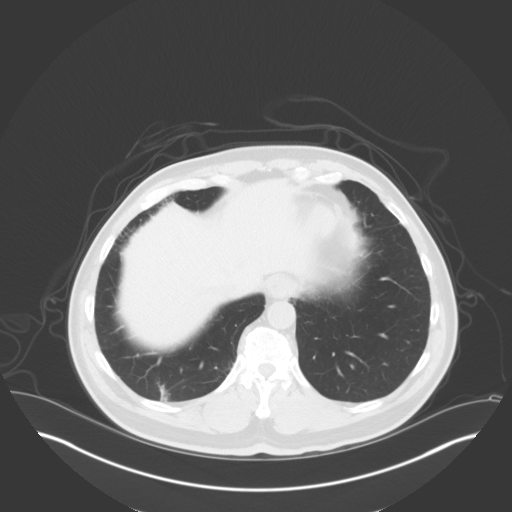
[im 94/135  lung]
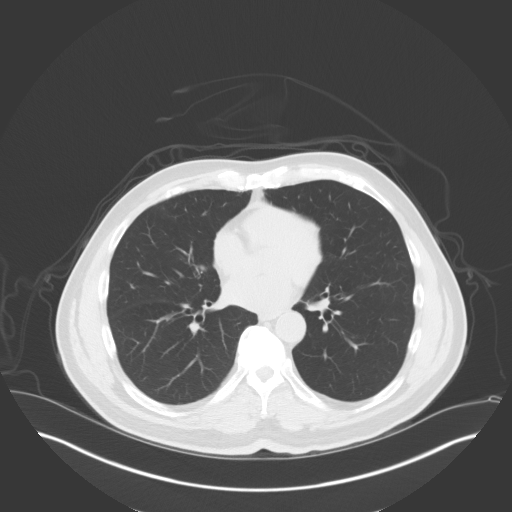
[im 108/135  lung]
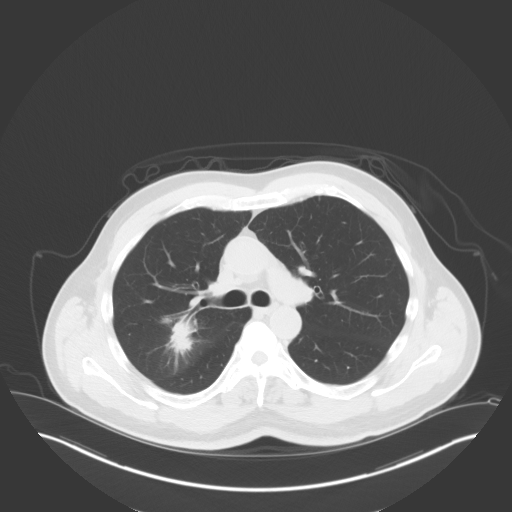
[im 121/135  mediastinal]
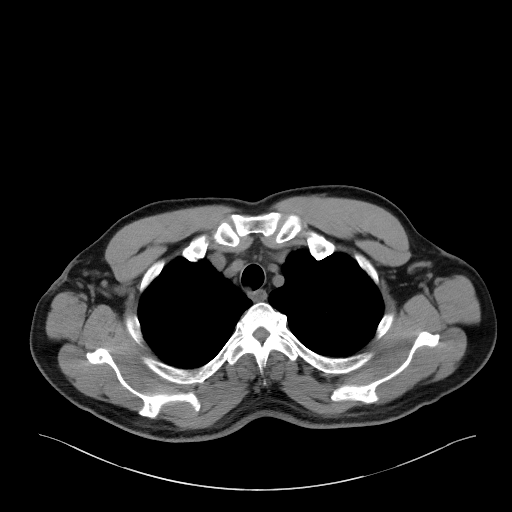
[im 121/135  lung]
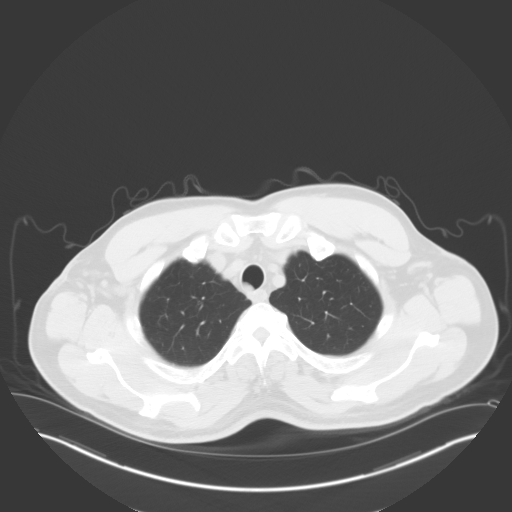

[Series 5: coronals · coronal · 0.82mm/px · 3 of 149 slices shown]
[im 30/149  lung]
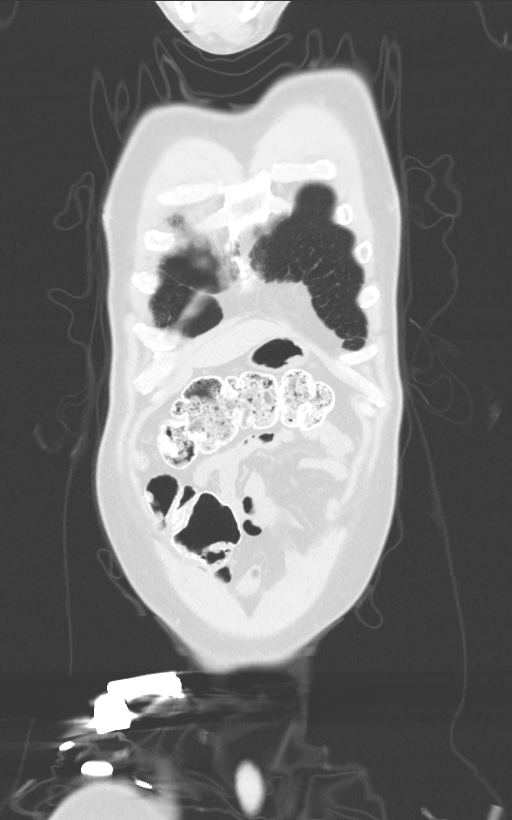
[im 60/149  lung]
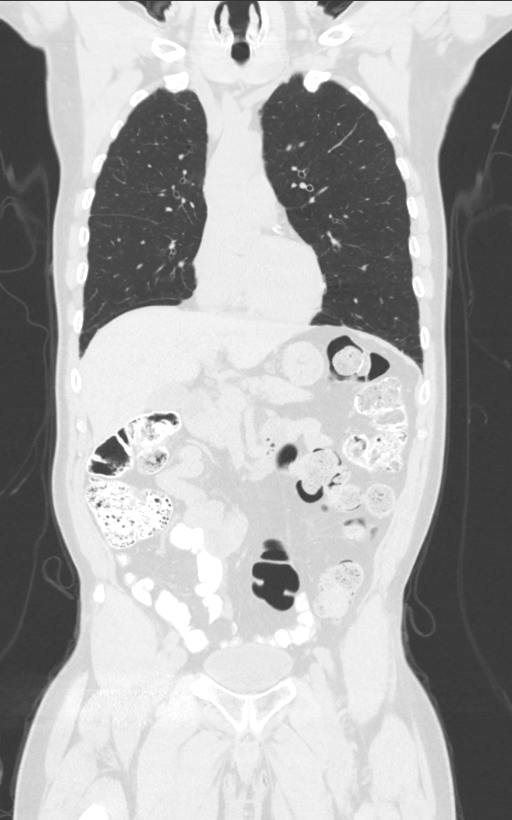
[im 89/149  lung]
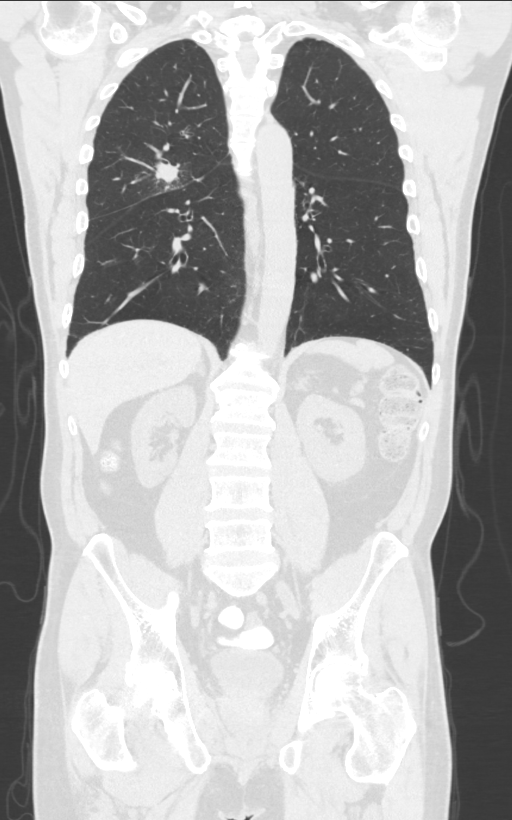

[12 of 36 positions shown; findings below may reference images not displayed]

Imaging Indication: Assess response to therapy

Interval therapy since last imaging? Yes

Initial Cancer Diagnosis

Date: [DATE]; Established by: Biopsy-proven

Detailed Pathology: Stage IV non-small cell lung cancer,
adenocarcinoma.

Primary Tumor location: Right upper lobe.  Metastases to brain.

Surgeries: No.

Chemotherapy: Yes; Ongoing? Yes; Most recent administration:
[DATE]

Immunotherapy?  Yes; Type: Keytruda; Ongoing? Yes

Radiation therapy?  Yes; Date Range: [DATE]; Target: Brain

Other Cancers: Thyroid cancer.

EXAM:
CT CHEST, ABDOMEN AND PELVIS WITHOUT CONTRAST
FINDINGS: CT CHEST FINDINGS

Cardiovascular: Normal heart size. No significant pericardial
effusion/thickening. Left anterior descending and left circumflex
coronary atherosclerosis Atherosclerotic nonaneurysmal thoracic
aorta. Normal caliber pulmonary arteries.

Mediastinum/Nodes: Stable hypodense 2.2 cm left thyroid nodule. This
has been evaluated on previous imaging. (ref: [HOSPITAL]. [DATE]): 143-50). Unremarkable esophagus. No pathologically
enlarged axillary, mediastinal or hilar lymph nodes, noting limited
sensitivity for the detection of hilar adenopathy on this
noncontrast study.

Lungs/Pleura: No pneumothorax. No pleural effusion. Spiculated solid
3.6 x 2.4 cm posterior right upper lobe lung mass (series 4/image
70), previously 3.3 x 2.4 cm on [DATE] chest CT and 3.0 x 2.1 cm
on [DATE] chest CT using similar measurement technique, mildly
increased. No additional significant pulmonary nodules. Small mildly
thickened parenchymal bands in the dependent basilar right lower
lobe, new since recent chest CT angiogram study, favoring minimal
atelectasis.

Musculoskeletal: No aggressive appearing focal osseous lesions. Mild
thoracic spondylosis.

CT ABDOMEN PELVIS FINDINGS

Hepatobiliary: Normal liver with no liver mass. Normal gallbladder
with no radiopaque cholelithiasis. No biliary ductal dilatation.

Pancreas: Normal, with no mass or duct dilation.

Spleen: Normal size. No mass.

Adrenals/Urinary Tract: Normal adrenals. No hydronephrosis. No renal
stones. No contour deforming renal masses. Normal bladder.

Stomach/Bowel: Normal non-distended stomach. Normal caliber small
bowel with no small bowel wall thickening. Normal appendix. Oral
contrast transits to the colon. Moderate diffuse colonic stool
volume. No large bowel wall thickening, diverticulosis or acute
pericolonic fat stranding.

Vascular/Lymphatic: Atherosclerotic nonaneurysmal abdominal aorta.
No pathologically enlarged lymph nodes in the abdomen or pelvis.

Reproductive: Mild prostatomegaly.

Other: No pneumoperitoneum, ascites or focal fluid collection.

Musculoskeletal: No aggressive appearing focal osseous lesions.
Moderate lumbar spondylosis.
IMPRESSION: 1. Spiculated solid 3.6 cm posterior right upper lobe lung mass,
mildly increased, compatible with primary bronchogenic carcinoma.
2. No thoracic adenopathy. No evidence of distant metastatic
disease.
3. Moderate diffuse colonic stool volume, suggesting constipation.
4. Mild prostatomegaly.
5. Aortic Atherosclerosis ([WM]-[WM]).

## 2020-06-08 IMAGING — CT CT CHEST W/O CM
2 of 4 series · 12 of 36 positions shown, 15 images · non-contrast
Comparison: CT chest angiogram [DATE]. [DATE] CT
abdomen/pelvis per

CLINICAL DATA: Primary Cancer Type: Lung
TECHNIQUE: Multidetector CT imaging of the chest, abdomen and pelvis was
performed following the standard protocol without IV contrast.

[Series 2: cap w/o · axial · non-contrast · 0.88mm/px · z∈[-379,+156]mm · 9 of 135 slices shown, 12 images]
[im 14/135  mediastinal]
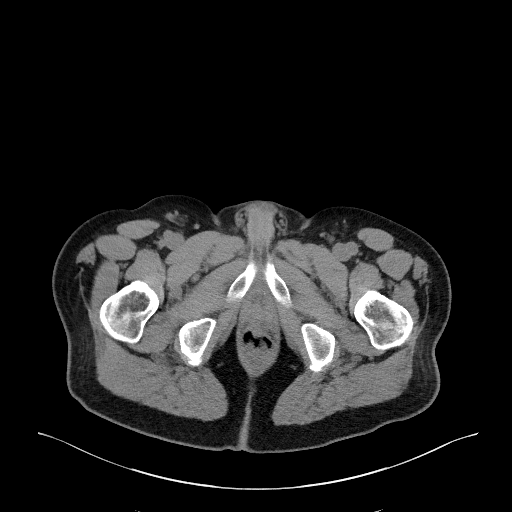
[im 14/135  lung]
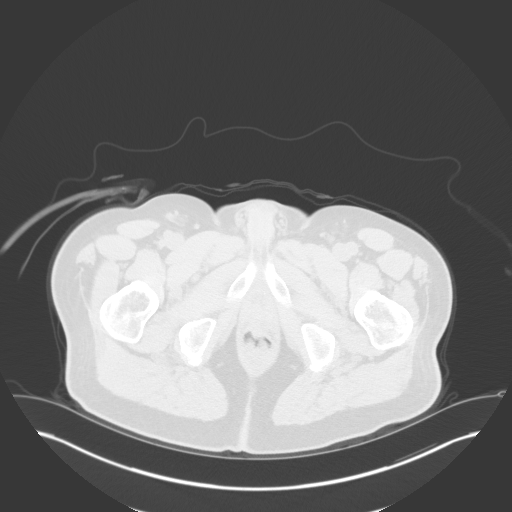
[im 27/135  lung]
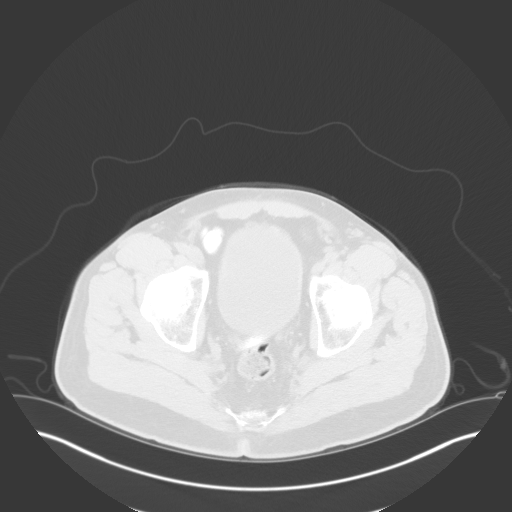
[im 41/135  lung]
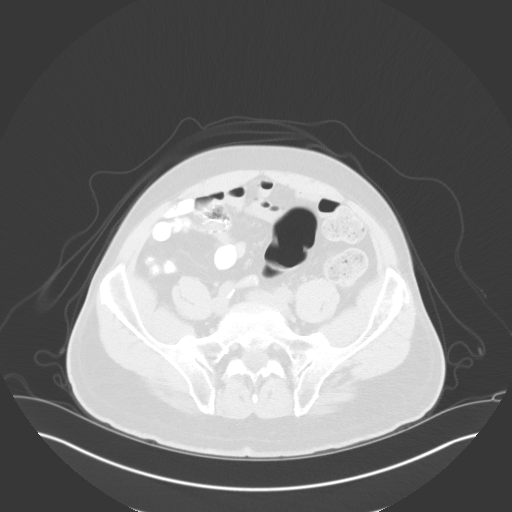
[im 54/135  lung]
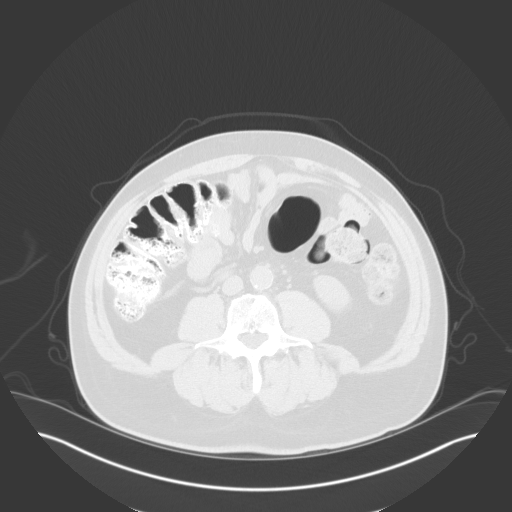
[im 68/135  mediastinal]
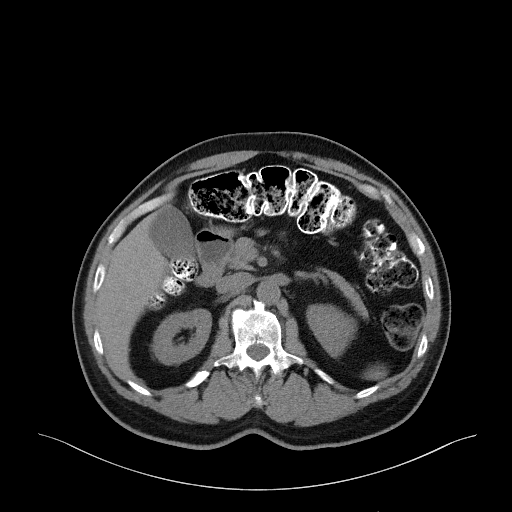
[im 68/135  lung]
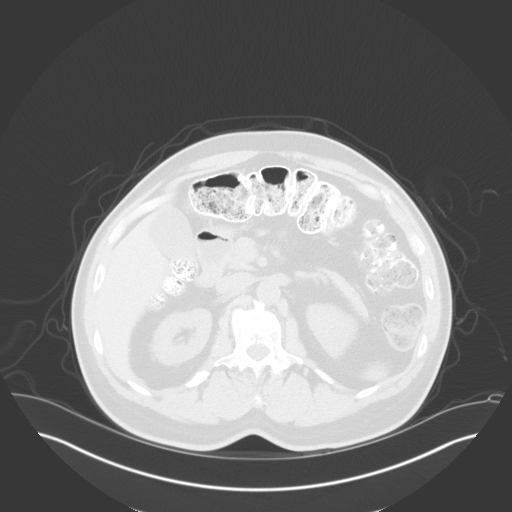
[im 81/135  lung]
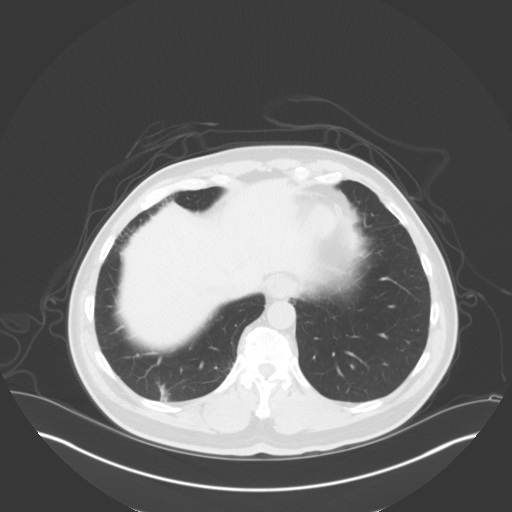
[im 94/135  lung]
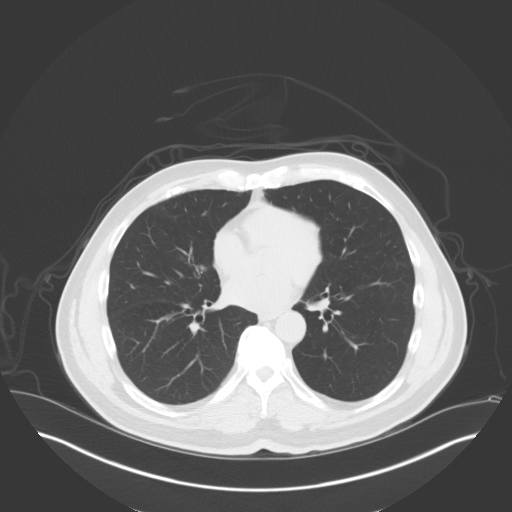
[im 108/135  lung]
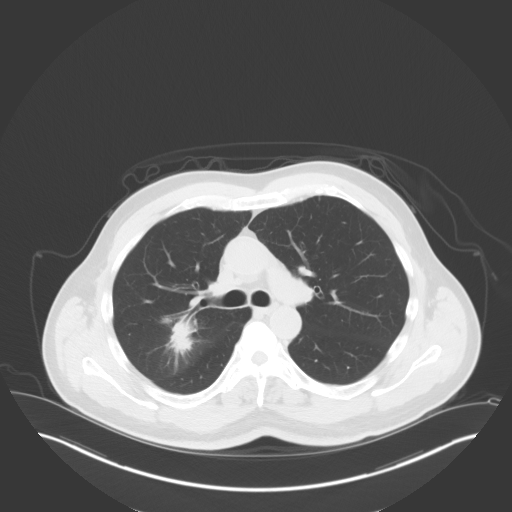
[im 121/135  mediastinal]
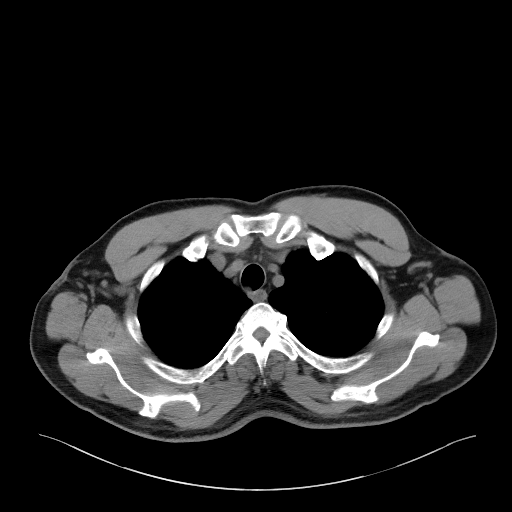
[im 121/135  lung]
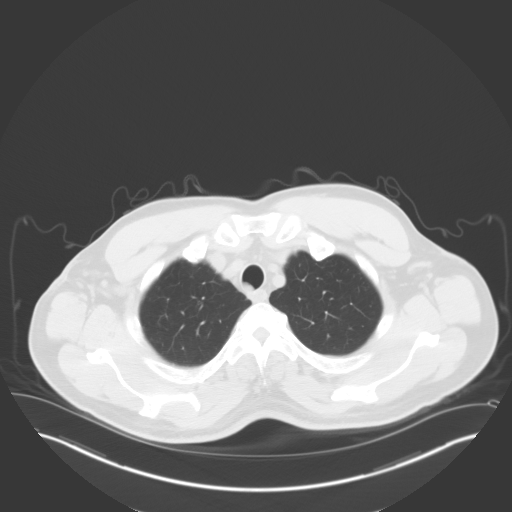

[Series 5: coronals · coronal · 0.82mm/px · 3 of 149 slices shown]
[im 30/149  lung]
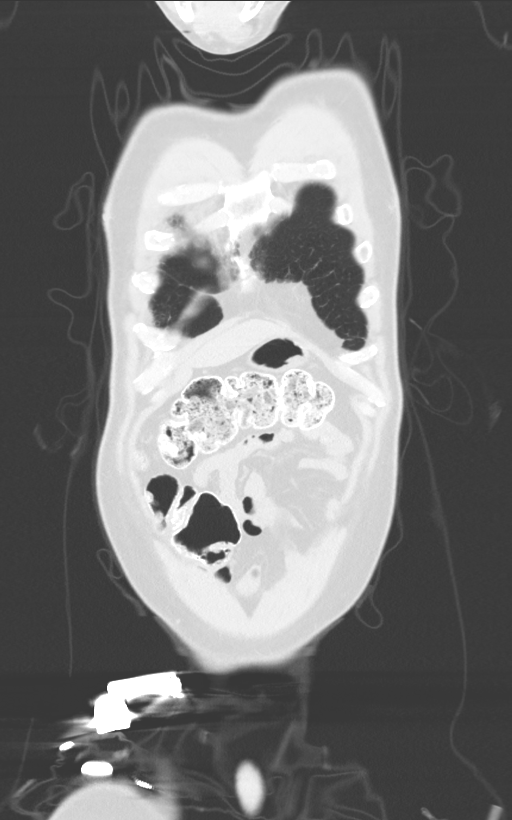
[im 60/149  lung]
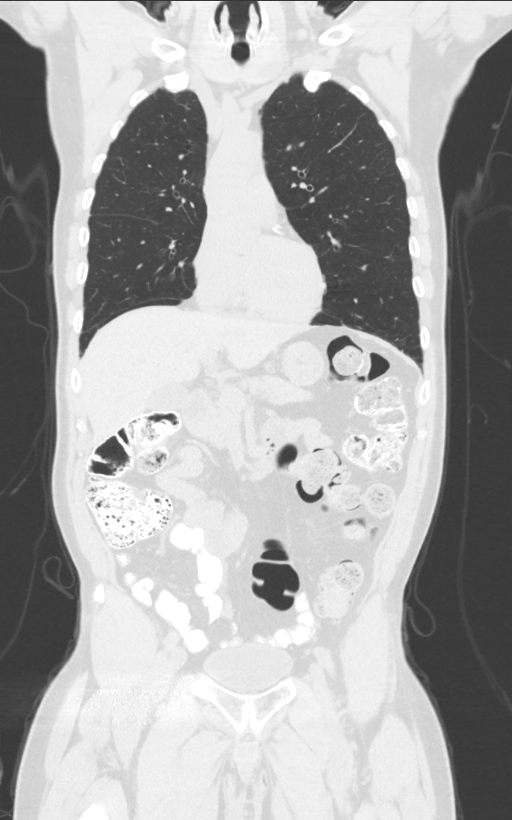
[im 89/149  lung]
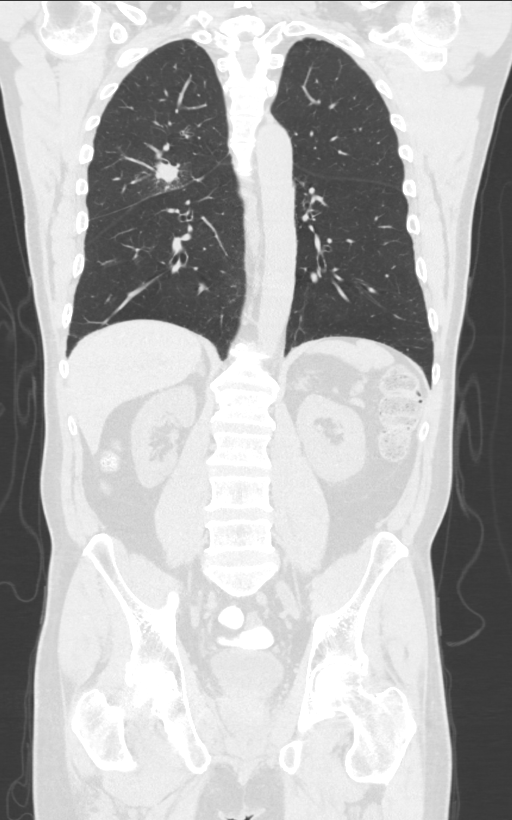

[12 of 36 positions shown; findings below may reference images not displayed]

Imaging Indication: Assess response to therapy

Interval therapy since last imaging? Yes

Initial Cancer Diagnosis

Date: [DATE]; Established by: Biopsy-proven

Detailed Pathology: Stage IV non-small cell lung cancer,
adenocarcinoma.

Primary Tumor location: Right upper lobe.  Metastases to brain.

Surgeries: No.

Chemotherapy: Yes; Ongoing? Yes; Most recent administration:
[DATE]

Immunotherapy?  Yes; Type: Keytruda; Ongoing? Yes

Radiation therapy?  Yes; Date Range: [DATE]; Target: Brain

Other Cancers: Thyroid cancer.

EXAM:
CT CHEST, ABDOMEN AND PELVIS WITHOUT CONTRAST
FINDINGS: CT CHEST FINDINGS

Cardiovascular: Normal heart size. No significant pericardial
effusion/thickening. Left anterior descending and left circumflex
coronary atherosclerosis Atherosclerotic nonaneurysmal thoracic
aorta. Normal caliber pulmonary arteries.

Mediastinum/Nodes: Stable hypodense 2.2 cm left thyroid nodule. This
has been evaluated on previous imaging. (ref: [HOSPITAL]. [DATE]): 143-50). Unremarkable esophagus. No pathologically
enlarged axillary, mediastinal or hilar lymph nodes, noting limited
sensitivity for the detection of hilar adenopathy on this
noncontrast study.

Lungs/Pleura: No pneumothorax. No pleural effusion. Spiculated solid
3.6 x 2.4 cm posterior right upper lobe lung mass (series 4/image
70), previously 3.3 x 2.4 cm on [DATE] chest CT and 3.0 x 2.1 cm
on [DATE] chest CT using similar measurement technique, mildly
increased. No additional significant pulmonary nodules. Small mildly
thickened parenchymal bands in the dependent basilar right lower
lobe, new since recent chest CT angiogram study, favoring minimal
atelectasis.

Musculoskeletal: No aggressive appearing focal osseous lesions. Mild
thoracic spondylosis.

CT ABDOMEN PELVIS FINDINGS

Hepatobiliary: Normal liver with no liver mass. Normal gallbladder
with no radiopaque cholelithiasis. No biliary ductal dilatation.

Pancreas: Normal, with no mass or duct dilation.

Spleen: Normal size. No mass.

Adrenals/Urinary Tract: Normal adrenals. No hydronephrosis. No renal
stones. No contour deforming renal masses. Normal bladder.

Stomach/Bowel: Normal non-distended stomach. Normal caliber small
bowel with no small bowel wall thickening. Normal appendix. Oral
contrast transits to the colon. Moderate diffuse colonic stool
volume. No large bowel wall thickening, diverticulosis or acute
pericolonic fat stranding.

Vascular/Lymphatic: Atherosclerotic nonaneurysmal abdominal aorta.
No pathologically enlarged lymph nodes in the abdomen or pelvis.

Reproductive: Mild prostatomegaly.

Other: No pneumoperitoneum, ascites or focal fluid collection.

Musculoskeletal: No aggressive appearing focal osseous lesions.
Moderate lumbar spondylosis.
IMPRESSION: 1. Spiculated solid 3.6 cm posterior right upper lobe lung mass,
mildly increased, compatible with primary bronchogenic carcinoma.
2. No thoracic adenopathy. No evidence of distant metastatic
disease.
3. Moderate diffuse colonic stool volume, suggesting constipation.
4. Mild prostatomegaly.
5. Aortic Atherosclerosis ([WM]-[WM]).

## 2020-06-09 ENCOUNTER — Inpatient Hospital Stay: Payer: No Typology Code available for payment source | Admitting: Nutrition

## 2020-06-09 ENCOUNTER — Inpatient Hospital Stay: Payer: No Typology Code available for payment source

## 2020-06-09 ENCOUNTER — Other Ambulatory Visit: Payer: Self-pay

## 2020-06-09 ENCOUNTER — Other Ambulatory Visit: Payer: Self-pay | Admitting: Medical Oncology

## 2020-06-09 ENCOUNTER — Encounter: Payer: Self-pay | Admitting: Internal Medicine

## 2020-06-09 ENCOUNTER — Inpatient Hospital Stay (HOSPITAL_BASED_OUTPATIENT_CLINIC_OR_DEPARTMENT_OTHER): Payer: No Typology Code available for payment source | Admitting: Internal Medicine

## 2020-06-09 VITALS — BP 147/91 | HR 73 | Temp 97.8°F | Resp 18 | Ht 74.0 in | Wt 183.7 lb

## 2020-06-09 DIAGNOSIS — C3411 Malignant neoplasm of upper lobe, right bronchus or lung: Secondary | ICD-10-CM

## 2020-06-09 DIAGNOSIS — Z5112 Encounter for antineoplastic immunotherapy: Secondary | ICD-10-CM

## 2020-06-09 DIAGNOSIS — G4700 Insomnia, unspecified: Secondary | ICD-10-CM

## 2020-06-09 DIAGNOSIS — Z5111 Encounter for antineoplastic chemotherapy: Secondary | ICD-10-CM

## 2020-06-09 LAB — CBC WITH DIFFERENTIAL (CANCER CENTER ONLY)
Abs Immature Granulocytes: 0.01 10*3/uL (ref 0.00–0.07)
Basophils Absolute: 0 10*3/uL (ref 0.0–0.1)
Basophils Relative: 1 %
Eosinophils Absolute: 0.1 10*3/uL (ref 0.0–0.5)
Eosinophils Relative: 2 %
HCT: 32.4 % — ABNORMAL LOW (ref 39.0–52.0)
Hemoglobin: 10.9 g/dL — ABNORMAL LOW (ref 13.0–17.0)
Immature Granulocytes: 0 %
Lymphocytes Relative: 31 %
Lymphs Abs: 1.4 10*3/uL (ref 0.7–4.0)
MCH: 30 pg (ref 26.0–34.0)
MCHC: 33.6 g/dL (ref 30.0–36.0)
MCV: 89.3 fL (ref 80.0–100.0)
Monocytes Absolute: 0.8 10*3/uL (ref 0.1–1.0)
Monocytes Relative: 19 %
Neutro Abs: 2.1 10*3/uL (ref 1.7–7.7)
Neutrophils Relative %: 47 %
Platelet Count: 258 10*3/uL (ref 150–400)
RBC: 3.63 MIL/uL — ABNORMAL LOW (ref 4.22–5.81)
RDW: 17.8 % — ABNORMAL HIGH (ref 11.5–15.5)
WBC Count: 4.4 10*3/uL (ref 4.0–10.5)
nRBC: 0 % (ref 0.0–0.2)

## 2020-06-09 LAB — CMP (CANCER CENTER ONLY)
ALT: 19 U/L (ref 0–44)
AST: 18 U/L (ref 15–41)
Albumin: 3.7 g/dL (ref 3.5–5.0)
Alkaline Phosphatase: 104 U/L (ref 38–126)
Anion gap: 7 (ref 5–15)
BUN: 16 mg/dL (ref 8–23)
CO2: 23 mmol/L (ref 22–32)
Calcium: 9.1 mg/dL (ref 8.9–10.3)
Chloride: 107 mmol/L (ref 98–111)
Creatinine: 1.85 mg/dL — ABNORMAL HIGH (ref 0.61–1.24)
GFR, Estimated: 40 mL/min — ABNORMAL LOW (ref >60)
Glucose, Bld: 95 mg/dL (ref 70–99)
Potassium: 4.2 mmol/L (ref 3.5–5.1)
Sodium: 137 mmol/L (ref 135–145)
Total Bilirubin: <0.2 mg/dL — ABNORMAL LOW (ref 0.3–1.2)
Total Protein: 7.3 g/dL (ref 6.5–8.1)

## 2020-06-09 MED ORDER — SODIUM CHLORIDE 0.9 % IV SOLN
Freq: Once | INTRAVENOUS | Status: AC
  Filled 2020-06-09: qty 250

## 2020-06-09 MED ORDER — TEMAZEPAM 15 MG PO CAPS: 15.0000 mg | ORAL_CAPSULE | Freq: Every evening | ORAL | 0 refills | Status: DC | PRN

## 2020-06-09 MED ORDER — SODIUM CHLORIDE 0.9 % IV SOLN
200.0000 mg | Freq: Once | INTRAVENOUS | Status: AC
  Administered 2020-06-09: 200 mg via INTRAVENOUS
  Filled 2020-06-09: qty 8

## 2020-06-09 MED ORDER — SODIUM CHLORIDE 0.9 % IV SOLN
360.0000 mg | Freq: Once | INTRAVENOUS | Status: AC
  Administered 2020-06-09: 360 mg via INTRAVENOUS
  Filled 2020-06-09: qty 36

## 2020-06-09 MED ORDER — SODIUM CHLORIDE 0.9 % IV SOLN
150.0000 mg | Freq: Once | INTRAVENOUS | Status: AC
  Administered 2020-06-09: 150 mg via INTRAVENOUS
  Filled 2020-06-09: qty 150

## 2020-06-09 MED ORDER — SODIUM CHLORIDE 0.9 % IV SOLN
10.0000 mg | Freq: Once | INTRAVENOUS | Status: AC
  Administered 2020-06-09: 10 mg via INTRAVENOUS
  Filled 2020-06-09: qty 10

## 2020-06-09 MED ORDER — PALONOSETRON HCL INJECTION 0.25 MG/5ML
INTRAVENOUS | Status: AC
  Filled 2020-06-09: qty 5

## 2020-06-09 MED ORDER — PALONOSETRON HCL INJECTION 0.25 MG/5ML
0.2500 mg | Freq: Once | INTRAVENOUS | Status: AC
  Administered 2020-06-09: 0.25 mg via INTRAVENOUS

## 2020-06-09 NOTE — Addendum Note (Signed)
Addended by: Ardeen Garland on: 06/09/2020 01:20 PM   Modules accepted: Orders

## 2020-06-09 NOTE — Progress Notes (Signed)
Ok to treat with elevated Scr today. Hold Alimta.  Hardie Pulley, PharmD, BCPS, BCOP

## 2020-06-09 NOTE — Progress Notes (Signed)
[place] [phone]   Fax:(336) [place] [address] [place] [address]  DIAGNOSIS: Stage IV non-small cell lung cancer, favored to be adenocarcinoma. He presented with posterior right upper lobe nodule as well as right hilar, infrahilar, subcarinal, and right paratracheal lymphadenopathy. He also has a 1.4 cm x 1 cm soft tissue nodule in the skin/subcutaneous fat in the left axilla. 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 He was diagnosed in August 2021  Molecular Biomarkers: BIOMARKER(S)         % CFDNA OR AMPLIFICATION       ASSOCIATED FDA-APPROVED THERAPIES        CLINICAL TRIAL AVAILABILITY [id number] 2.9% None    Yes [id number] 0.4% None    Yes [id number] 0.2% None    Yes  PRIOR THERAPY:  1) SRS to the small metastatic brain lesion under the care of Dr. [name] on 03/18/20  CURRENT THERAPY: Palliative systemic chemotherapy with carboplatin for an AUC of 5, Alimta 500 mg/m2, and Keytruda 200 mg IV every 3 weeks. First dose expected on 04/07/20.  Status post 3 cycles.  INTERVAL HISTORY: Randy Andersen 64 y.o. male returns to the clinic today for follow-up visit.  The patient is feeling fine today with no concerning complaints except for lack of sleep.  He was given prescription for Restoril but he could not get it refilled by the [facility] pharmacy.  The patient presented to the emergency department last week complaining of shortness of breath and he was found on CT angiogram of the chest to have evidence for pulmonary embolism involving the right lower lobe pulmonary artery and started on treatment with Xarelto.  He is feeling fine today except for the above complaints his breathing is much better.  He tolerated the last cycle of his treatment fairly well.  He denied having any nausea, vomiting, diarrhea or constipation.  He denied having any headache or  visual changes.  He had repeat CT scan of the chest, abdomen pelvis performed recently and he is here for evaluation and discussion of his scan results and treatment options.   MEDICAL HISTORY: Past Medical History:  Diagnosis Date  . Asthma    as a child  . Chronic pain disorder    LT leg and foot  . Heart murmur    as a child  . Hypertension   . Malignant neoplasm of upper lobe of right lung ([place])   . Stroke [facility])    mini stroke - 2015, some tingling in fingers on right     ALLERGIES:  has No Known Allergies.  MEDICATIONS:  Current Outpatient Medications  Medication Sig Dispense Refill  . amLODipine (NORVASC) 10 MG tablet Take 10 mg by mouth daily.    Marland Kitchen doxylamine, Sleep, (UNISOM) 25 MG tablet Take 25 mg by mouth at bedtime as needed for sleep.     . folic acid (FOLVITE) 1 MG tablet Take 1 tablet (1 mg total) by mouth daily. 30 tablet 2  . HYDROcodone-acetaminophen (NORCO) 10-325 MG tablet Take 1 tablet by mouth 2 (two) times daily.    . phenazopyridine (PYRIDIUM) 200 MG tablet Take 1 tablet (200 mg total) by mouth 3 (three) times daily. 6 tablet 0  . prochlorperazine (COMPAZINE) 10 MG tablet Take 1 tablet (10 mg total) by mouth every 6 (six) hours as needed. 30 tablet 2  . RIVAROXABAN (XARELTO) VTE STARTER  PACK (15 & 20 MG TABLETS) Follow package directions: Take one 15mg  tablet by mouth twice a day. On day 22, switch to one 20mg  tablet once a day. Take with food. 51 each 0  . tamsulosin (FLOMAX) 0.4 MG CAPS capsule Take 1 capsule (0.4 mg total) by mouth daily. 30 capsule 2  . temazepam (RESTORIL) 15 MG capsule Take 1 capsule (15 mg total) by mouth at bedtime as needed for sleep. 30 capsule 0   No current facility-administered medications for this visit.    SURGICAL HISTORY:  Past Surgical History:  Procedure Laterality Date  . BUNIONECTOMY Left    had hammer toe surgery also and callus removed  . BUNIONECTOMY Right    also had hammer toe surgery and callus removed  .  LEG SURGERY Left    PT reports he has pins placed in his Lt leg  . ORIF TIBIA PLATEAU  10/09/2012   Dr Lorin Mercy  . ORIF TIBIA PLATEAU Left 10/08/2012   Procedure: OPEN REDUCTION INTERNAL FIXATION (ORIF) TIBIAL PLATEAU;  Surgeon: Marybelle Killings, MD;  Location: [facility];  Service: Orthopedics;  Laterality: Left;  Marland Kitchen VIDEO BRONCHOSCOPY WITH ENDOBRONCHIAL NAVIGATION N/A 02/25/2020   Procedure: VIDEO BRONCHOSCOPY WITH ENDOBRONCHIAL NAVIGATION with biopsies;  Surgeon: Collene Gobble, MD;  Location: [facility] OR;  Service: Thoracic;  Laterality: N/A;  . VIDEO BRONCHOSCOPY WITH ENDOBRONCHIAL ULTRASOUND N/A 02/25/2020   Procedure: VIDEO BRONCHOSCOPY WITH ENDOBRONCHIAL ULTRASOUND;  Surgeon: Collene Gobble, MD;  Location: [facility] OR;  Service: Thoracic;  Laterality: N/A;    REVIEW OF SYSTEMS:  Constitutional: positive for fatigue Eyes: negative Ears, nose, mouth, throat, and face: negative Respiratory: positive for dyspnea on exertion Cardiovascular: negative Gastrointestinal: negative Genitourinary:negative Integument/breast: negative Hematologic/lymphatic: negative Musculoskeletal:negative Neurological: negative Behavioral/Psych: negative Endocrine: negative Allergic/Immunologic: negative   PHYSICAL EXAMINATION: General appearance: alert, cooperative, fatigued and no distress Head: Normocephalic, without obvious abnormality, atraumatic Neck: no adenopathy, no JVD, supple, symmetrical, trachea midline and thyroid not enlarged, symmetric, no tenderness/mass/nodules Lymph nodes: Cervical, supraclavicular, and axillary nodes normal. Resp: clear to auscultation bilaterally Back: symmetric, no curvature. ROM normal. No CVA tenderness. Cardio: regular rate and rhythm, S1, S2 normal, no murmur, click, rub or gallop GI: soft, non-tender; bowel sounds normal; no masses,  no organomegaly Extremities: extremities normal, atraumatic, no cyanosis or edema Neurologic: Alert and oriented X 3, normal strength and tone. Normal  symmetric reflexes. Normal coordination and gait  ECOG PERFORMANCE STATUS: 1 - Symptomatic but completely ambulatory  Blood pressure (!) 147/91, pulse 73, temperature 97.8 F (36.6 C), temperature source Tympanic, resp. rate 18, height 6\' 2"  (1.88 m), weight 183 lb 11.2 oz (83.3 kg), SpO2 100 %.  LABORATORY DATA: Lab Results  Component Value Date   WBC 4.4 06/09/2020   HGB 10.9 (L) 06/09/2020   HCT 32.4 (L) 06/09/2020   MCV 89.3 06/09/2020   PLT 258 06/09/2020      Chemistry      Component Value Date/Time   NA 137 06/09/2020 1127   K 4.2 06/09/2020 1127   CL 107 06/09/2020 1127   CO2 23 06/09/2020 1127   BUN 16 06/09/2020 1127   CREATININE 1.85 (H) 06/09/2020 1127      Component Value Date/Time   CALCIUM 9.1 06/09/2020 1127   ALKPHOS 104 06/09/2020 1127   AST 18 06/09/2020 1127   ALT 19 06/09/2020 1127   BILITOT <0.2 (L) 06/09/2020 1127       RADIOGRAPHIC STUDIES: CT ABDOMEN PELVIS WO CONTRAST  Result Date: 06/09/2020 CLINICAL  DATA:  Primary Cancer Type: Lung Imaging Indication: Assess response to therapy Interval therapy since last imaging? Yes Initial Cancer Diagnosis Date: 02/25/2020; Established by: Biopsy-proven Detailed Pathology: Stage IV non-small cell lung cancer, adenocarcinoma. 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 Surgeries: No. Chemotherapy: Yes; Ongoing? Yes; Most recent administration: 05/19/2020 Immunotherapy?  Yes; Type: Keytruda; Ongoing? Yes Radiation therapy?  Yes; Date Range: 03/18/2020; Target: Brain Other Cancers: Thyroid cancer. EXAM: CT CHEST, ABDOMEN AND PELVIS WITHOUT CONTRAST TECHNIQUE: Multidetector CT imaging of the chest, abdomen and pelvis was performed following the standard protocol without IV contrast. COMPARISON:  CT chest angiogram 05/24/2020. 10/28/2013 CT abdomen/pelvis per FINDINGS: CT CHEST FINDINGS Cardiovascular: Normal heart size. No significant pericardial effusion/thickening. Left anterior descending and  left circumflex coronary atherosclerosis Atherosclerotic nonaneurysmal thoracic aorta. Normal caliber pulmonary arteries. Mediastinum/Nodes: Stable hypodense 2.2 cm left thyroid nodule. This has been evaluated on previous imaging. (ref: J Am Coll Radiol. 2015 Feb;12(2): 143-50). Unremarkable esophagus. No pathologically enlarged axillary, mediastinal or hilar lymph nodes, noting limited sensitivity for the detection of hilar adenopathy on this noncontrast study. Lungs/Pleura: No pneumothorax. No pleural effusion. Spiculated solid 3.6 x 2.4 cm posterior right upper lobe lung mass (series 4/image 70), previously 3.3 x 2.4 cm on 05/24/2020 chest CT and 3.0 x 2.1 cm on 02/23/2020 chest CT using similar measurement technique, mildly increased. No additional significant pulmonary nodules. Small mildly thickened parenchymal bands in the dependent basilar right lower lobe, new since recent chest CT angiogram study, favoring minimal atelectasis. Musculoskeletal: No aggressive appearing focal osseous lesions. Mild thoracic spondylosis. CT ABDOMEN PELVIS FINDINGS Hepatobiliary: Normal liver with no liver mass. Normal gallbladder with no radiopaque cholelithiasis. No biliary ductal dilatation. Pancreas: Normal, with no mass or duct dilation. Spleen: Normal size. No mass. Adrenals/Urinary Tract: Normal adrenals. No hydronephrosis. No renal stones. No contour deforming renal masses. Normal bladder. Stomach/Bowel: Normal non-distended stomach. Normal caliber small bowel with no small bowel wall thickening. Normal appendix. Oral contrast transits to the colon. Moderate diffuse colonic stool volume. No large bowel wall thickening, diverticulosis or acute pericolonic fat stranding. Vascular/Lymphatic: Atherosclerotic nonaneurysmal abdominal aorta. No pathologically enlarged lymph nodes in the abdomen or pelvis. Reproductive: Mild prostatomegaly. Other: No pneumoperitoneum, ascites or focal fluid collection. Musculoskeletal: No  aggressive appearing focal osseous lesions. Moderate lumbar spondylosis. IMPRESSION: 1. Spiculated solid 3.6 cm posterior right upper lobe lung mass, mildly increased, compatible with primary bronchogenic carcinoma. 2. No thoracic adenopathy. No evidence of distant metastatic disease. 3. Moderate diffuse colonic stool volume, suggesting constipation. 4. Mild prostatomegaly. 5. Aortic Atherosclerosis (ICD10-I70.0). Electronically Signed   By: Ilona Sorrel M.D.   On: 06/09/2020 09:52   DG Chest 2 View  Result Date: 05/24/2020 CLINICAL DATA:  Short of breath, right upper lobe lung cancer, chest pain EXAM: CHEST - 2 VIEW COMPARISON:  02/25/2020, 02/23/2020 FINDINGS: Frontal and lateral views of the chest demonstrate an unremarkable cardiac silhouette. Stable spiculated right upper lobe nodule consistent with known lung cancer. No acute airspace disease, effusion, or pneumothorax. No acute bony abnormality. IMPRESSION: 1. Stable spiculated right upper lobe pulmonary nodule consistent with known lung cancer. 2. No acute airspace disease. Electronically Signed   By: Randa Ngo M.D.   On: 05/24/2020 17:57   CT CHEST WO CONTRAST  Result Date: 06/09/2020 CLINICAL DATA:  Primary Cancer Type: Lung Imaging Indication: Assess response to therapy Interval therapy since last imaging? Yes Initial Cancer Diagnosis Date: 02/25/2020; Established by: Biopsy-proven Detailed Pathology: Stage IV non-small cell lung cancer, adenocarcinoma. Primary  Tumor location: Right upper lobe.  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 Surgeries: No. Chemotherapy: Yes; Ongoing? Yes; Most recent administration: 05/19/2020 Immunotherapy?  Yes; Type: Keytruda; Ongoing? Yes Radiation therapy?  Yes; Date Range: 03/18/2020; Target: Brain Other Cancers: Thyroid cancer. EXAM: CT CHEST, ABDOMEN AND PELVIS WITHOUT CONTRAST TECHNIQUE: Multidetector CT imaging of the chest, abdomen and pelvis was performed following the standard protocol without IV contrast. COMPARISON:   CT chest angiogram 05/24/2020. 10/28/2013 CT abdomen/pelvis per FINDINGS: CT CHEST FINDINGS Cardiovascular: Normal heart size. No significant pericardial effusion/thickening. Left anterior descending and left circumflex coronary atherosclerosis Atherosclerotic nonaneurysmal thoracic aorta. Normal caliber pulmonary arteries. Mediastinum/Nodes: Stable hypodense 2.2 cm left thyroid nodule. This has been evaluated on previous imaging. (ref: J Am Coll Radiol. 2015 Feb;12(2): 143-50). Unremarkable esophagus. No pathologically enlarged axillary, mediastinal or hilar lymph nodes, noting limited sensitivity for the detection of hilar adenopathy on this noncontrast study. Lungs/Pleura: No pneumothorax. No pleural effusion. Spiculated solid 3.6 x 2.4 cm posterior right upper lobe lung mass (series 4/image 70), previously 3.3 x 2.4 cm on 05/24/2020 chest CT and 3.0 x 2.1 cm on 02/23/2020 chest CT using similar measurement technique, mildly increased. No additional significant pulmonary nodules. Small mildly thickened parenchymal bands in the dependent basilar right lower lobe, new since recent chest CT angiogram study, favoring minimal atelectasis. Musculoskeletal: No aggressive appearing focal osseous lesions. Mild thoracic spondylosis. CT ABDOMEN PELVIS FINDINGS Hepatobiliary: Normal liver with no liver mass. Normal gallbladder with no radiopaque cholelithiasis. No biliary ductal dilatation. Pancreas: Normal, with no mass or duct dilation. Spleen: Normal size. No mass. Adrenals/Urinary Tract: Normal adrenals. No hydronephrosis. No renal stones. No contour deforming renal masses. Normal bladder. Stomach/Bowel: Normal non-distended stomach. Normal caliber small bowel with no small bowel wall thickening. Normal appendix. Oral contrast transits to the colon. Moderate diffuse colonic stool volume. No large bowel wall thickening, diverticulosis or acute pericolonic fat stranding. Vascular/Lymphatic: Atherosclerotic nonaneurysmal  abdominal aorta. No pathologically enlarged lymph nodes in the abdomen or pelvis. Reproductive: Mild prostatomegaly. Other: No pneumoperitoneum, ascites or focal fluid collection. Musculoskeletal: No aggressive appearing focal osseous lesions. Moderate lumbar spondylosis. IMPRESSION: 1. Spiculated solid 3.6 cm posterior right upper lobe lung mass, mildly increased, compatible with primary bronchogenic carcinoma. 2. No thoracic adenopathy. No evidence of distant metastatic disease. 3. Moderate diffuse colonic stool volume, suggesting constipation. 4. Mild prostatomegaly. 5. Aortic Atherosclerosis (ICD10-I70.0). Electronically Signed   By: Ilona Sorrel M.D.   On: 06/09/2020 09:52   CT Angio Chest PE W and/or Wo Contrast  Result Date: 05/24/2020 CLINICAL DATA:  Shortness of breath. Lung cancer. Ongoing chemotherapy. EXAM: CT ANGIOGRAPHY CHEST WITH CONTRAST TECHNIQUE: Multidetector CT imaging of the chest was performed using the standard protocol during bolus administration of intravenous contrast. Multiplanar CT image reconstructions and MIPs were obtained to evaluate the vascular anatomy. CONTRAST:  134mL OMNIPAQUE IOHEXOL 350 MG/ML SOLN COMPARISON:  02/23/2020 FINDINGS: Cardiovascular: Filling defects within the right lower lobe pulmonary arteries posteriorly compatible with pulmonary emboli. No additional pulmonary emboli. No evidence of right heart strain. Heart is normal size. Scattered aortic calcifications. Mediastinum/Nodes: No visible mediastinal, hilar or axillary adenopathy. Lungs/Pleura: 2.8 x 2.6 cm mass seen in the posterior right upper lobe compared to 2.6 x 2.5 cm previously. Increasing opacity noted peripheral to the nodule/mass which could reflect postobstructive atelectasis. Scarring in the lung bases. No effusions. Upper Abdomen: Imaging into the upper abdomen demonstrates no acute findings. Musculoskeletal: Chest wall soft tissues are unremarkable. No acute bony abnormality. Review of the MIP  images confirms the  above findings. IMPRESSION: Right lower lobe pulmonary embolus. No evidence of right heart strain. Stable or slightly larger right upper lobe nodule/mass measuring 2.8 x 2.6 cm compared with 2.6 x 2.5 cm previously. Increased seen adjacent peripheral opacity, likely postobstructive atelectasis. Aortic Atherosclerosis (ICD10-I70.0). These results were called by telephone at the time of interpretation on 05/24/2020 at 8:41 pm to provider [facility] , who verbally acknowledged these results. Electronically Signed   By: Rolm Baptise M.D.   On: 05/24/2020 20:42   US Abdomen Limited RUQ (LIVER/GB)  Result Date: 05/24/2020 CLINICAL DATA:  Right upper quadrant pain for the past 2 days. EXAM: ULTRASOUND ABDOMEN LIMITED RIGHT UPPER QUADRANT COMPARISON:  CT abdomen pelvis dated October 28, 2013. FINDINGS: Gallbladder: No gallstones or wall thickening visualized. No sonographic Murphy sign noted by sonographer. Common bile duct: Diameter: 4 mm, normal. Liver: No focal lesion identified. Within normal limits in parenchymal echogenicity. Portal vein is patent on color Doppler imaging with normal direction of blood flow towards the liver. Other: None. IMPRESSION: 1. Normal right upper quadrant ultrasound. Electronically Signed   By: Titus Dubin M.D.   On: 05/24/2020 19:42    ASSESSMENT AND PLAN: This is a very pleasant 64 years old African-American male diagnosed with stage IV non-small cell lung cancer, adenocarcinoma presented with right upper lobe nodule in addition to right hilar, infrahilar, subcarinal and right paratracheal adenopathy in addition to subcutaneous nodules in the left axilla and 2 small metastatic brain lesion diagnosed in August 2021.  The patient has no actionable mutations. He is currently undergoing systemic chemotherapy with carboplatin for AUC of 5, Alimta 500 mg/M2 and Keytruda 200 mg IV every 3 weeks.  Status post 3 cycles. The patient continues to tolerate this treatment  well with no concerning adverse effects except for fatigue. He had repeat CT scan of the chest, abdomen pelvis performed recently.  I personally and independently reviewed the scan images and discussed the results with the patient today. His scan showed a stable disease except for slightly enlarging right upper lobe lung mass.  He has no other areas of disease reported on the scan. I recommended for the patient to continue his current treatment with carboplatin and Keytruda today.  I will discontinue Alimta the cycle because of his renal insufficiency. For the recently diagnosed pulmonary embolism, he will continue his current treatment with Xarelto. For the suspicious thyroid neoplasm, he is followed by Dr. Harlow Asa and expected to have surgery in January 2022. The patient will come back for follow-up visit in 3 weeks for evaluation before the next cycle of his treatment. He was advised to call immediately if he has any concerning symptoms in the interval. The patient voices understanding of current disease status and treatment options and is in agreement with the current care plan.  All questions were answered. The patient knows to call the clinic with any problems, questions or concerns. We can certainly see the patient much sooner if necessary.   Disclaimer: This note was dictated with voice recognition software. Similar sounding words can inadvertently be transcribed and may not be corrected upon review.

## 2020-06-09 NOTE — Patient Instructions (Signed)
[facility] Discharge Instructions for Patients Receiving Chemotherapy  Today you received the following chemotherapy agents: Keytruda, Carboplatin   To help prevent nausea and vomiting after your treatment, we encourage you to take your nausea medication as directed.    If you develop nausea and vomiting that is not controlled by your nausea medication, call the clinic.   BELOW ARE SYMPTOMS THAT SHOULD BE REPORTED IMMEDIATELY:  *FEVER GREATER THAN 100.5 F  *CHILLS WITH OR WITHOUT FEVER  NAUSEA AND VOMITING THAT IS NOT CONTROLLED WITH YOUR NAUSEA MEDICATION  *UNUSUAL SHORTNESS OF BREATH  *UNUSUAL BRUISING OR BLEEDING  TENDERNESS IN MOUTH AND THROAT WITH OR WITHOUT PRESENCE OF ULCERS  *URINARY PROBLEMS  *BOWEL PROBLEMS  UNUSUAL RASH Items with * indicate a potential emergency and should be followed up as soon as possible.  Feel free to call the clinic should you have any questions or concerns. The clinic phone number is [phone].  Please show the [facility] at check-in to the Emergency Department and triage nurse.

## 2020-06-09 NOTE — Progress Notes (Signed)
Brief nutrition follow-up completed with patient during infusion for stage IV non-small cell lung cancer. Patient's weight has improved and was documented as 185 pounds on November 8.  This is increased from 180.9 pounds. Patient reports he eats well. He denies nausea, vomiting, constipation, and diarrhea. He denies nutrition questions or needs at this time.  Nutrition diagnosis of food and nutrition related knowledge deficit resolved.  Provided support and encouragement.  Encouraged patient to continue adequate calories and protein to minimize weight loss. Patient encouraged to contact RD with questions or concerns.

## 2020-06-10 ENCOUNTER — Other Ambulatory Visit: Payer: Self-pay | Admitting: Radiation Therapy

## 2020-06-10 ENCOUNTER — Encounter: Payer: Self-pay | Admitting: General Practice

## 2020-06-10 NOTE — Progress Notes (Signed)
[facility] CSW Progress Notes  Call from patient, he is behind on his utilities.  He has applied for disability (may be early retirement).  He has not heard any information about his possible monthly income - he is unable to work at this time.  He was working prior to diagnosis.  He has already received help from [facility] and may not be eligible for more assistance.  CSW asked [facility] to reach out to him and also advised patient to speak w his landlord [employer]) re any help they may be able to provide as his housing is paid for under a voucher program. He can be enrolled in [facility] at his next [facility] visit.  Edwyna Shell, LCSW Clinical Social Worker Phone:  [phone]

## 2020-06-16 ENCOUNTER — Other Ambulatory Visit: Payer: Self-pay

## 2020-06-16 ENCOUNTER — Inpatient Hospital Stay: Payer: No Typology Code available for payment source

## 2020-06-16 ENCOUNTER — Inpatient Hospital Stay: Payer: No Typology Code available for payment source | Admitting: General Practice

## 2020-06-16 DIAGNOSIS — C3411 Malignant neoplasm of upper lobe, right bronchus or lung: Secondary | ICD-10-CM

## 2020-06-16 DIAGNOSIS — Z5112 Encounter for antineoplastic immunotherapy: Secondary | ICD-10-CM | POA: Diagnosis not present

## 2020-06-16 LAB — CMP (CANCER CENTER ONLY)
ALT: 17 U/L (ref 0–44)
AST: 16 U/L (ref 15–41)
Albumin: 3.9 g/dL (ref 3.5–5.0)
Alkaline Phosphatase: 110 U/L (ref 38–126)
Anion gap: 8 (ref 5–15)
BUN: 22 mg/dL (ref 8–23)
CO2: 24 mmol/L (ref 22–32)
Calcium: 9.4 mg/dL (ref 8.9–10.3)
Chloride: 106 mmol/L (ref 98–111)
Creatinine: 1.67 mg/dL — ABNORMAL HIGH (ref 0.61–1.24)
GFR, Estimated: 45 mL/min — ABNORMAL LOW (ref >60)
Glucose, Bld: 110 mg/dL — ABNORMAL HIGH (ref 70–99)
Potassium: 4.3 mmol/L (ref 3.5–5.1)
Sodium: 138 mmol/L (ref 135–145)
Total Bilirubin: 0.3 mg/dL (ref 0.3–1.2)
Total Protein: 7.7 g/dL (ref 6.5–8.1)

## 2020-06-16 LAB — CBC WITH DIFFERENTIAL (CANCER CENTER ONLY)
Abs Immature Granulocytes: 0.02 10*3/uL (ref 0.00–0.07)
Basophils Absolute: 0.1 10*3/uL (ref 0.0–0.1)
Basophils Relative: 1 %
Eosinophils Absolute: 0.1 10*3/uL (ref 0.0–0.5)
Eosinophils Relative: 1 %
HCT: 35 % — ABNORMAL LOW (ref 39.0–52.0)
Hemoglobin: 11.6 g/dL — ABNORMAL LOW (ref 13.0–17.0)
Immature Granulocytes: 0 %
Lymphocytes Relative: 29 %
Lymphs Abs: 1.5 10*3/uL (ref 0.7–4.0)
MCH: 30.1 pg (ref 26.0–34.0)
MCHC: 33.1 g/dL (ref 30.0–36.0)
MCV: 90.9 fL (ref 80.0–100.0)
Monocytes Absolute: 0.6 10*3/uL (ref 0.1–1.0)
Monocytes Relative: 12 %
Neutro Abs: 3 10*3/uL (ref 1.7–7.7)
Neutrophils Relative %: 57 %
Platelet Count: 261 10*3/uL (ref 150–400)
RBC: 3.85 MIL/uL — ABNORMAL LOW (ref 4.22–5.81)
RDW: 18.1 % — ABNORMAL HIGH (ref 11.5–15.5)
WBC Count: 5.2 10*3/uL (ref 4.0–10.5)
nRBC: 0 % (ref 0.0–0.2)

## 2020-06-16 NOTE — Progress Notes (Signed)
[facility] CSW Progress Notes  Patient enrolled in [place] and received disbursement today.  Edwyna Shell, LCSW Clinical Social Worker Phone:  [phone]

## 2020-06-23 ENCOUNTER — Inpatient Hospital Stay: Payer: No Typology Code available for payment source | Attending: Internal Medicine

## 2020-06-23 ENCOUNTER — Other Ambulatory Visit: Payer: Self-pay

## 2020-06-23 DIAGNOSIS — Z5111 Encounter for antineoplastic chemotherapy: Secondary | ICD-10-CM | POA: Insufficient documentation

## 2020-06-23 DIAGNOSIS — C3411 Malignant neoplasm of upper lobe, right bronchus or lung: Secondary | ICD-10-CM | POA: Diagnosis present

## 2020-06-23 DIAGNOSIS — Z79899 Other long term (current) drug therapy: Secondary | ICD-10-CM | POA: Diagnosis not present

## 2020-06-23 DIAGNOSIS — Z5112 Encounter for antineoplastic immunotherapy: Secondary | ICD-10-CM | POA: Diagnosis not present

## 2020-06-23 DIAGNOSIS — C7931 Secondary malignant neoplasm of brain: Secondary | ICD-10-CM | POA: Insufficient documentation

## 2020-06-23 LAB — CBC WITH DIFFERENTIAL (CANCER CENTER ONLY)
Abs Immature Granulocytes: 0.01 10*3/uL (ref 0.00–0.07)
Basophils Absolute: 0 10*3/uL (ref 0.0–0.1)
Basophils Relative: 1 %
Eosinophils Absolute: 0.1 10*3/uL (ref 0.0–0.5)
Eosinophils Relative: 2 %
HCT: 32.4 % — ABNORMAL LOW (ref 39.0–52.0)
Hemoglobin: 10.8 g/dL — ABNORMAL LOW (ref 13.0–17.0)
Immature Granulocytes: 0 %
Lymphocytes Relative: 22 %
Lymphs Abs: 1.2 10*3/uL (ref 0.7–4.0)
MCH: 30.6 pg (ref 26.0–34.0)
MCHC: 33.3 g/dL (ref 30.0–36.0)
MCV: 91.8 fL (ref 80.0–100.0)
Monocytes Absolute: 0.7 10*3/uL (ref 0.1–1.0)
Monocytes Relative: 13 %
Neutro Abs: 3.3 10*3/uL (ref 1.7–7.7)
Neutrophils Relative %: 62 %
Platelet Count: 173 10*3/uL (ref 150–400)
RBC: 3.53 MIL/uL — ABNORMAL LOW (ref 4.22–5.81)
RDW: 18.6 % — ABNORMAL HIGH (ref 11.5–15.5)
WBC Count: 5.3 10*3/uL (ref 4.0–10.5)
nRBC: 0 % (ref 0.0–0.2)

## 2020-06-23 LAB — CMP (CANCER CENTER ONLY)
ALT: 15 U/L (ref 0–44)
AST: 18 U/L (ref 15–41)
Albumin: 3.6 g/dL (ref 3.5–5.0)
Alkaline Phosphatase: 100 U/L (ref 38–126)
Anion gap: 9 (ref 5–15)
BUN: 16 mg/dL (ref 8–23)
CO2: 23 mmol/L (ref 22–32)
Calcium: 9.6 mg/dL (ref 8.9–10.3)
Chloride: 108 mmol/L (ref 98–111)
Creatinine: 1.58 mg/dL — ABNORMAL HIGH (ref 0.61–1.24)
GFR, Estimated: 48 mL/min — ABNORMAL LOW (ref >60)
Glucose, Bld: 99 mg/dL (ref 70–99)
Potassium: 4.1 mmol/L (ref 3.5–5.1)
Sodium: 140 mmol/L (ref 135–145)
Total Bilirubin: 0.4 mg/dL (ref 0.3–1.2)
Total Protein: 7.3 g/dL (ref 6.5–8.1)

## 2020-06-23 LAB — TSH: TSH: 1.179 u[IU]/mL (ref 0.320–4.118)

## 2020-06-24 ENCOUNTER — Ambulatory Visit (HOSPITAL_COMMUNITY): Admission: RE | Admit: 2020-06-24 | Payer: No Typology Code available for payment source | Source: Ambulatory Visit

## 2020-06-29 ENCOUNTER — Ambulatory Visit: Payer: No Typology Code available for payment source | Admitting: Radiation Oncology

## 2020-06-30 ENCOUNTER — Other Ambulatory Visit: Payer: Self-pay

## 2020-06-30 ENCOUNTER — Inpatient Hospital Stay: Payer: No Typology Code available for payment source

## 2020-06-30 ENCOUNTER — Encounter: Payer: Self-pay | Admitting: Internal Medicine

## 2020-06-30 ENCOUNTER — Telehealth: Payer: Self-pay

## 2020-06-30 ENCOUNTER — Inpatient Hospital Stay (HOSPITAL_BASED_OUTPATIENT_CLINIC_OR_DEPARTMENT_OTHER): Payer: No Typology Code available for payment source | Admitting: Internal Medicine

## 2020-06-30 ENCOUNTER — Encounter: Payer: Self-pay | Admitting: Radiation Oncology

## 2020-06-30 ENCOUNTER — Encounter: Payer: Self-pay | Admitting: General Practice

## 2020-06-30 ENCOUNTER — Ambulatory Visit (HOSPITAL_COMMUNITY): Admission: RE | Admit: 2020-06-30 | Payer: No Typology Code available for payment source | Source: Ambulatory Visit

## 2020-06-30 VITALS — BP 148/86 | HR 71 | Temp 97.8°F | Resp 13 | Wt 187.6 lb

## 2020-06-30 DIAGNOSIS — C3411 Malignant neoplasm of upper lobe, right bronchus or lung: Secondary | ICD-10-CM

## 2020-06-30 DIAGNOSIS — Z5112 Encounter for antineoplastic immunotherapy: Secondary | ICD-10-CM | POA: Diagnosis not present

## 2020-06-30 DIAGNOSIS — Z5111 Encounter for antineoplastic chemotherapy: Secondary | ICD-10-CM

## 2020-06-30 LAB — CMP (CANCER CENTER ONLY)
ALT: 16 U/L (ref 0–44)
AST: 21 U/L (ref 15–41)
Albumin: 3.8 g/dL (ref 3.5–5.0)
Alkaline Phosphatase: 110 U/L (ref 38–126)
Anion gap: 11 (ref 5–15)
BUN: 19 mg/dL (ref 8–23)
CO2: 20 mmol/L — ABNORMAL LOW (ref 22–32)
Calcium: 9.8 mg/dL (ref 8.9–10.3)
Chloride: 107 mmol/L (ref 98–111)
Creatinine: 1.65 mg/dL — ABNORMAL HIGH (ref 0.61–1.24)
GFR, Estimated: 46 mL/min — ABNORMAL LOW (ref >60)
Glucose, Bld: 114 mg/dL — ABNORMAL HIGH (ref 70–99)
Potassium: 3.8 mmol/L (ref 3.5–5.1)
Sodium: 138 mmol/L (ref 135–145)
Total Bilirubin: 0.4 mg/dL (ref 0.3–1.2)
Total Protein: 7.8 g/dL (ref 6.5–8.1)

## 2020-06-30 LAB — CBC WITH DIFFERENTIAL (CANCER CENTER ONLY)
Abs Immature Granulocytes: 0.01 10*3/uL (ref 0.00–0.07)
Basophils Absolute: 0 10*3/uL (ref 0.0–0.1)
Basophils Relative: 1 %
Eosinophils Absolute: 0.1 10*3/uL (ref 0.0–0.5)
Eosinophils Relative: 3 %
HCT: 36 % — ABNORMAL LOW (ref 39.0–52.0)
Hemoglobin: 12.1 g/dL — ABNORMAL LOW (ref 13.0–17.0)
Immature Granulocytes: 0 %
Lymphocytes Relative: 23 %
Lymphs Abs: 1 10*3/uL (ref 0.7–4.0)
MCH: 30.7 pg (ref 26.0–34.0)
MCHC: 33.6 g/dL (ref 30.0–36.0)
MCV: 91.4 fL (ref 80.0–100.0)
Monocytes Absolute: 0.6 10*3/uL (ref 0.1–1.0)
Monocytes Relative: 15 %
Neutro Abs: 2.4 10*3/uL (ref 1.7–7.7)
Neutrophils Relative %: 58 %
Platelet Count: 145 10*3/uL — ABNORMAL LOW (ref 150–400)
RBC: 3.94 MIL/uL — ABNORMAL LOW (ref 4.22–5.81)
RDW: 17.6 % — ABNORMAL HIGH (ref 11.5–15.5)
WBC Count: 4.1 10*3/uL (ref 4.0–10.5)
nRBC: 0 % (ref 0.0–0.2)

## 2020-06-30 MED ORDER — PROCHLORPERAZINE MALEATE 10 MG PO TABS
ORAL_TABLET | ORAL | Status: AC
  Filled 2020-06-30: qty 1

## 2020-06-30 MED ORDER — SODIUM CHLORIDE 0.9 % IV SOLN
Freq: Once | INTRAVENOUS | Status: AC
  Filled 2020-06-30: qty 250

## 2020-06-30 MED ORDER — PROCHLORPERAZINE MALEATE 10 MG PO TABS
10.0000 mg | ORAL_TABLET | Freq: Once | ORAL | Status: AC
  Administered 2020-06-30: 10 mg via ORAL

## 2020-06-30 MED ORDER — SODIUM CHLORIDE 0.9 % IV SOLN
200.0000 mg | Freq: Once | INTRAVENOUS | Status: AC
  Administered 2020-06-30: 200 mg via INTRAVENOUS
  Filled 2020-06-30: qty 8

## 2020-06-30 MED ORDER — SODIUM CHLORIDE 0.9 % IV SOLN
400.0000 mg/m2 | Freq: Once | INTRAVENOUS | Status: AC
  Administered 2020-06-30: 800 mg via INTRAVENOUS
  Filled 2020-06-30: qty 20

## 2020-06-30 NOTE — Progress Notes (Signed)
[facility] Telephone:([phone]   Fax:(336) [facility] [address] [place] [address]  DIAGNOSIS: Stage IV non-small cell lung cancer, favored to be adenocarcinoma. He presented with posterior right upper lobe nodule as well as right hilar, infrahilar, subcarinal, and right paratracheal lymphadenopathy. He also has a 1.4 cm x 1 cm soft tissue nodule in the skin/subcutaneous fat in the left axilla. 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 He was diagnosed in August 2021  Molecular Biomarkers: BIOMARKER(S)         % CFDNA OR AMPLIFICATION       ASSOCIATED FDA-APPROVED THERAPIES        CLINICAL TRIAL AVAILABILITY [id number] 2.9% None    Yes [id number] 0.4% None    Yes [id number] 0.2% None    Yes  PRIOR THERAPY:  1) SRS to the small metastatic brain lesion under the care of Dr. [name] on 03/18/20  CURRENT THERAPY: Palliative systemic chemotherapy with carboplatin for an AUC of 5, Alimta 500 mg/m2, and Keytruda 200 mg IV every 3 weeks. First dose expected on 04/07/20.  Status post 4 cycles.  Starting from cycle #5 he will be on maintenance treatment with Alimta and Keytruda every 3 weeks  INTERVAL HISTORY: Randy Andersen 64 y.o. male returns to the clinic today for follow-up visit.  The patient is feeling fine today with no concerning complaints.  He tolerated the last cycle of his treatment fairly well with no significant adverse effects.  He denied having any current chest pain, shortness of breath, cough or hemoptysis.  He denied having any fever or chills.  He has no nausea, vomiting, diarrhea or constipation.  He has no headache or visual changes.  He was given prescription for Restoril for insomnia but the patient mentioned that its not helping him and he is specifically asking for Ambien.  He is here today for evaluation before starting the first cycle of his  maintenance treatment with Alimta and Keytruda.   MEDICAL HISTORY: Past Medical History:  Diagnosis Date  . Asthma    as a child  . Chronic pain disorder    LT leg and foot  . Heart murmur    as a child  . Hypertension   . Malignant neoplasm of upper lobe of right lung ([place])   . Stroke [facility])    mini stroke - 2015, some tingling in fingers on right     ALLERGIES:  has No Known Allergies.  MEDICATIONS:  Current Outpatient Medications  Medication Sig Dispense Refill  . amLODipine (NORVASC) 10 MG tablet Take 10 mg by mouth daily.    . folic acid (FOLVITE) 1 MG tablet Take 1 tablet (1 mg total) by mouth daily. 30 tablet 2  . HYDROcodone-acetaminophen (NORCO) 10-325 MG tablet Take 1 tablet by mouth 2 (two) times daily.    . phenazopyridine (PYRIDIUM) 200 MG tablet Take 1 tablet (200 mg total) by mouth 3 (three) times daily. 6 tablet 0  . prochlorperazine (COMPAZINE) 10 MG tablet Take 1 tablet (10 mg total) by mouth every 6 (six) hours as needed. 30 tablet 2  . RIVAROXABAN (XARELTO) VTE STARTER PACK (15 & 20 MG TABLETS) Follow package directions: Take one 15mg  tablet by mouth twice a day. On day 22, switch to one 20mg  tablet once a day. Take with food. 51 each 0  . tamsulosin (FLOMAX) 0.4 MG CAPS capsule Take 1  capsule (0.4 mg total) by mouth daily. 30 capsule 2  . temazepam (RESTORIL) 15 MG capsule Take 1 capsule (15 mg total) by mouth at bedtime as needed for sleep. Pt is veteran - Call pt to set up delivery 30 capsule 0  . doxylamine, Sleep, (UNISOM) 25 MG tablet Take 25 mg by mouth at bedtime as needed for sleep.  (Patient not taking: Reported on 06/30/2020)     No current facility-administered medications for this visit.    SURGICAL HISTORY:  Past Surgical History:  Procedure Laterality Date  . BUNIONECTOMY Left    had hammer toe surgery also and callus removed  . BUNIONECTOMY Right    also had hammer toe surgery and callus removed  . LEG SURGERY Left    PT reports he has  pins placed in his Lt leg  . ORIF TIBIA PLATEAU  10/09/2012   Dr Lorin Mercy  . ORIF TIBIA PLATEAU Left 10/08/2012   Procedure: OPEN REDUCTION INTERNAL FIXATION (ORIF) TIBIAL PLATEAU;  Surgeon: Marybelle Killings, MD;  Location: [facility];  Service: Orthopedics;  Laterality: Left;  Marland Kitchen VIDEO BRONCHOSCOPY WITH ENDOBRONCHIAL NAVIGATION N/A 02/25/2020   Procedure: VIDEO BRONCHOSCOPY WITH ENDOBRONCHIAL NAVIGATION with biopsies;  Surgeon: Collene Gobble, MD;  Location: [facility];  Service: Thoracic;  Laterality: N/A;  . VIDEO BRONCHOSCOPY WITH ENDOBRONCHIAL ULTRASOUND N/A 02/25/2020   Procedure: VIDEO BRONCHOSCOPY WITH ENDOBRONCHIAL ULTRASOUND;  Surgeon: Collene Gobble, MD;  Location: [facility];  Service: Thoracic;  Laterality: N/A;    REVIEW OF SYSTEMS:  A comprehensive review of systems was negative except for: Constitutional: positive for fatigue   PHYSICAL EXAMINATION: General appearance: alert, cooperative, fatigued and no distress Head: Normocephalic, without obvious abnormality, atraumatic Neck: no adenopathy, no JVD, supple, symmetrical, trachea midline and thyroid not enlarged, symmetric, no tenderness/mass/nodules Lymph nodes: Cervical, supraclavicular, and axillary nodes normal. Resp: clear to auscultation bilaterally Back: symmetric, no curvature. ROM normal. No CVA tenderness. Cardio: regular rate and rhythm, S1, S2 normal, no murmur, click, rub or gallop GI: soft, non-tender; bowel sounds normal; no masses,  no organomegaly Extremities: extremities normal, atraumatic, no cyanosis or edema  ECOG PERFORMANCE STATUS: 1 - Symptomatic but completely ambulatory  Blood pressure (!) 148/86, pulse 71, temperature 97.8 F (36.6 C), temperature source Tympanic, resp. rate 13, weight 187 lb 9.6 oz (85.1 kg), SpO2 100 %.  LABORATORY DATA: Lab Results  Component Value Date   WBC 4.1 06/30/2020   HGB 12.1 (L) 06/30/2020   HCT 36.0 (L) 06/30/2020   MCV 91.4 06/30/2020   PLT 145 (L) 06/30/2020      Chemistry       Component Value Date/Time   NA 138 06/30/2020 1122   K 3.8 06/30/2020 1122   CL 107 06/30/2020 1122   CO2 20 (L) 06/30/2020 1122   BUN 19 06/30/2020 1122   CREATININE 1.65 (H) 06/30/2020 1122      Component Value Date/Time   CALCIUM 9.8 06/30/2020 1122   ALKPHOS 110 06/30/2020 1122   AST 21 06/30/2020 1122   ALT 16 06/30/2020 1122   BILITOT 0.4 06/30/2020 1122       RADIOGRAPHIC STUDIES: CT ABDOMEN PELVIS WO CONTRAST  Result Date: 06/09/2020 CLINICAL DATA:  Primary Cancer Type: Lung Imaging Indication: Assess response to therapy Interval therapy since last imaging? Yes Initial Cancer Diagnosis Date: 02/25/2020; Established by: Biopsy-proven Detailed Pathology: Stage IV non-small cell lung cancer, adenocarcinoma. 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 Surgeries: No. Chemotherapy: Yes; Ongoing? Yes; Most  recent administration: 05/19/2020 Immunotherapy?  Yes; Type: Keytruda; Ongoing? Yes Radiation therapy?  Yes; Date Range: 03/18/2020; Target: Brain Other Cancers: Thyroid cancer. EXAM: CT CHEST, ABDOMEN AND PELVIS WITHOUT CONTRAST TECHNIQUE: Multidetector CT imaging of the chest, abdomen and pelvis was performed following the standard protocol without IV contrast. COMPARISON:  CT chest angiogram 05/24/2020. 10/28/2013 CT abdomen/pelvis per FINDINGS: CT CHEST FINDINGS Cardiovascular: Normal heart size. No significant pericardial effusion/thickening. Left anterior descending and left circumflex coronary atherosclerosis Atherosclerotic nonaneurysmal thoracic aorta. Normal caliber pulmonary arteries. Mediastinum/Nodes: Stable hypodense 2.2 cm left thyroid nodule. This has been evaluated on previous imaging. (ref: J Am Coll Radiol. 2015 Feb;12(2): 143-50). Unremarkable esophagus. No pathologically enlarged axillary, mediastinal or hilar lymph nodes, noting limited sensitivity for the detection of hilar adenopathy on this noncontrast study. Lungs/Pleura: No pneumothorax. No  pleural effusion. Spiculated solid 3.6 x 2.4 cm posterior right upper lobe lung mass (series 4/image 70), previously 3.3 x 2.4 cm on 05/24/2020 chest CT and 3.0 x 2.1 cm on 02/23/2020 chest CT using similar measurement technique, mildly increased. No additional significant pulmonary nodules. Small mildly thickened parenchymal bands in the dependent basilar right lower lobe, new since recent chest CT angiogram study, favoring minimal atelectasis. Musculoskeletal: No aggressive appearing focal osseous lesions. Mild thoracic spondylosis. CT ABDOMEN PELVIS FINDINGS Hepatobiliary: Normal liver with no liver mass. Normal gallbladder with no radiopaque cholelithiasis. No biliary ductal dilatation. Pancreas: Normal, with no mass or duct dilation. Spleen: Normal size. No mass. Adrenals/Urinary Tract: Normal adrenals. No hydronephrosis. No renal stones. No contour deforming renal masses. Normal bladder. Stomach/Bowel: Normal non-distended stomach. Normal caliber small bowel with no small bowel wall thickening. Normal appendix. Oral contrast transits to the colon. Moderate diffuse colonic stool volume. No large bowel wall thickening, diverticulosis or acute pericolonic fat stranding. Vascular/Lymphatic: Atherosclerotic nonaneurysmal abdominal aorta. No pathologically enlarged lymph nodes in the abdomen or pelvis. Reproductive: Mild prostatomegaly. Other: No pneumoperitoneum, ascites or focal fluid collection. Musculoskeletal: No aggressive appearing focal osseous lesions. Moderate lumbar spondylosis. IMPRESSION: 1. Spiculated solid 3.6 cm posterior right upper lobe lung mass, mildly increased, compatible with primary bronchogenic carcinoma. 2. No thoracic adenopathy. No evidence of distant metastatic disease. 3. Moderate diffuse colonic stool volume, suggesting constipation. 4. Mild prostatomegaly. 5. Aortic Atherosclerosis (ICD10-I70.0). Electronically Signed   By: Ilona Sorrel M.D.   On: 06/09/2020 09:52   CT CHEST WO  CONTRAST  Result Date: 06/09/2020 CLINICAL DATA:  Primary Cancer Type: Lung Imaging Indication: Assess response to therapy Interval therapy since last imaging? Yes Initial Cancer Diagnosis Date: 02/25/2020; Established by: Biopsy-proven Detailed Pathology: Stage IV non-small cell lung cancer, adenocarcinoma. 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 Surgeries: No. Chemotherapy: Yes; Ongoing? Yes; Most recent administration: 05/19/2020 Immunotherapy?  Yes; Type: Keytruda; Ongoing? Yes Radiation therapy?  Yes; Date Range: 03/18/2020; Target: Brain Other Cancers: Thyroid cancer. EXAM: CT CHEST, ABDOMEN AND PELVIS WITHOUT CONTRAST TECHNIQUE: Multidetector CT imaging of the chest, abdomen and pelvis was performed following the standard protocol without IV contrast. COMPARISON:  CT chest angiogram 05/24/2020. 10/28/2013 CT abdomen/pelvis per FINDINGS: CT CHEST FINDINGS Cardiovascular: Normal heart size. No significant pericardial effusion/thickening. Left anterior descending and left circumflex coronary atherosclerosis Atherosclerotic nonaneurysmal thoracic aorta. Normal caliber pulmonary arteries. Mediastinum/Nodes: Stable hypodense 2.2 cm left thyroid nodule. This has been evaluated on previous imaging. (ref: J Am Coll Radiol. 2015 Feb;12(2): 143-50). Unremarkable esophagus. No pathologically enlarged axillary, mediastinal or hilar lymph nodes, noting limited sensitivity for the detection of hilar adenopathy on this noncontrast study. Lungs/Pleura:  No pneumothorax. No pleural effusion. Spiculated solid 3.6 x 2.4 cm posterior right upper lobe lung mass (series 4/image 70), previously 3.3 x 2.4 cm on 05/24/2020 chest CT and 3.0 x 2.1 cm on 02/23/2020 chest CT using similar measurement technique, mildly increased. No additional significant pulmonary nodules. Small mildly thickened parenchymal bands in the dependent basilar right lower lobe, new since recent chest CT angiogram study, favoring  minimal atelectasis. Musculoskeletal: No aggressive appearing focal osseous lesions. Mild thoracic spondylosis. CT ABDOMEN PELVIS FINDINGS Hepatobiliary: Normal liver with no liver mass. Normal gallbladder with no radiopaque cholelithiasis. No biliary ductal dilatation. Pancreas: Normal, with no mass or duct dilation. Spleen: Normal size. No mass. Adrenals/Urinary Tract: Normal adrenals. No hydronephrosis. No renal stones. No contour deforming renal masses. Normal bladder. Stomach/Bowel: Normal non-distended stomach. Normal caliber small bowel with no small bowel wall thickening. Normal appendix. Oral contrast transits to the colon. Moderate diffuse colonic stool volume. No large bowel wall thickening, diverticulosis or acute pericolonic fat stranding. Vascular/Lymphatic: Atherosclerotic nonaneurysmal abdominal aorta. No pathologically enlarged lymph nodes in the abdomen or pelvis. Reproductive: Mild prostatomegaly. Other: No pneumoperitoneum, ascites or focal fluid collection. Musculoskeletal: No aggressive appearing focal osseous lesions. Moderate lumbar spondylosis. IMPRESSION: 1. Spiculated solid 3.6 cm posterior right upper lobe lung mass, mildly increased, compatible with primary bronchogenic carcinoma. 2. No thoracic adenopathy. No evidence of distant metastatic disease. 3. Moderate diffuse colonic stool volume, suggesting constipation. 4. Mild prostatomegaly. 5. Aortic Atherosclerosis (ICD10-I70.0). Electronically Signed   By: Ilona Sorrel M.D.   On: 06/09/2020 09:52    ASSESSMENT AND PLAN: This is a very pleasant 64 years old African-American male diagnosed with stage IV non-small cell lung cancer, adenocarcinoma presented with right upper lobe nodule in addition to right hilar, infrahilar, subcarinal and right paratracheal adenopathy in addition to subcutaneous nodules in the left axilla and 2 small metastatic brain lesion diagnosed in August 2021.  The patient has no actionable mutations. He is  currently undergoing systemic chemotherapy with carboplatin for AUC of 5, Alimta 500 mg/M2 and Keytruda 200 mg IV every 3 weeks.  Status post 4 cycles.  Starting from cycle #5 he will be on maintenance treatment with Alimta and Keytruda every 3 weeks.  I reduce the dose of his Alimta to 400 mg/M2 secondary to the renal insufficiency. The patient will proceed with cycle #5 today as planned. I will see him back for follow-up visit in 3 weeks for evaluation before the next cycle of his treatment. For the recently diagnosed pulmonary embolism, he will continue his current treatment with Xarelto. For the suspicious thyroid neoplasm, he is followed by Dr. Harlow Asa and expected to have surgery in January 2022. For the insomnia, he was given prescription for Restoril but he would like to change to Ambien.  I recommended for him to see his primary care physician for this prescription. He was advised to call immediately if he has any concerning symptom in the interval. The patient voices understanding of current disease status and treatment options and is in agreement with the current care plan.  All questions were answered. The patient knows to call the clinic with any problems, questions or concerns. We can certainly see the patient much sooner if necessary.   Disclaimer: This note was dictated with voice recognition software. Similar sounding words can inadvertently be transcribed and may not be corrected upon review.

## 2020-06-30 NOTE — Progress Notes (Signed)
[facility] Psychosocial Distress Screening Clinical Social Work  Clinical Social Work was referred by distress screening protocol.  The patient scored a 8 on the Psychosocial Distress Thermometer which indicates moderate distress. Clinical Social Worker contacted patient by phone to assess for distress and other psychosocial needs.   Spoke w patient, he is currently participating in a program jointly offered through the [employer] (worker is Ms Elinor Parkinson, he does not have her contact information) and the [place] - has a Merchandiser, retail (caseworker is Apolonio Schneiders [phone]).  Per patient, he believes these programs will help him locate a new rental unit as he has to move from his current place in Feb 2022.  Placed call to Apolonio Schneiders his [place] caseworker and requested a status update per patient request.  Will update patient as I have more information.   ONCBCN DISTRESS SCREENING 06/30/2020  Screening Type   Distress experienced in past week (1-10) 8  Practical problem type Housing  Emotional problem type   Other     Clinical Social Worker follow up needed: Yes.    If yes, follow up plan:  Update patient re his housing concerns when more information is available.    Beverely Pace, LCSW

## 2020-06-30 NOTE — Patient Instructions (Signed)
[facility] Discharge Instructions for Patients Receiving Chemotherapy  Today you received the following chemotherapy agents: keytruda, alimta  To help prevent nausea and vomiting after your treatment, we encourage you to take your nausea medication as directed.    If you develop nausea and vomiting that is not controlled by your nausea medication, call the clinic.   BELOW ARE SYMPTOMS THAT SHOULD BE REPORTED IMMEDIATELY:  *FEVER GREATER THAN 100.5 F  *CHILLS WITH OR WITHOUT FEVER  NAUSEA AND VOMITING THAT IS NOT CONTROLLED WITH YOUR NAUSEA MEDICATION  *UNUSUAL SHORTNESS OF BREATH  *UNUSUAL BRUISING OR BLEEDING  TENDERNESS IN MOUTH AND THROAT WITH OR WITHOUT PRESENCE OF ULCERS  *URINARY PROBLEMS  *BOWEL PROBLEMS  UNUSUAL RASH Items with * indicate a potential emergency and should be followed up as soon as possible.  Feel free to call the clinic should you have any questions or concerns. The clinic phone number is [phone].  Please show the [facility] at check-in to the Emergency Department and triage nurse.

## 2020-06-30 NOTE — Telephone Encounter (Signed)
Spoke with patient in regards to telephone appointment with [name] PA on 07/05/20 @ 3:00Pm for MRI results. Patient verbalized understanding of appointment date and time. Reviewed meaningful use questions with patient. TM

## 2020-06-30 NOTE — Progress Notes (Signed)
Pt discharged in stable condition.

## 2020-07-01 ENCOUNTER — Encounter: Payer: Self-pay | Admitting: General Practice

## 2020-07-01 NOTE — Progress Notes (Signed)
[facility] CSW Progress Notes  Call from Jonetta Osgood, [place] caseworker [phone]).  His current residence will not be available after mid February as landlord will not renew the lease.  The [employer] program is actively working with him to locate a new residence and may be able to help w moving.  He may need help w security deposit, caseworker will reach out to their community partners for any available assistance.  She is in active communication with patient about his housing needs and is working to address them.  Edwyna Shell, LCSW Clinical Social Worker Phone:  [phone]

## 2020-07-05 ENCOUNTER — Ambulatory Visit
Admission: RE | Admit: 2020-07-05 | Discharge: 2020-07-05 | Disposition: A | Discharge status: Home / Self Care | Payer: No Typology Code available for payment source | Source: Ambulatory Visit | Attending: Radiation Oncology | Admitting: Radiation Oncology

## 2020-07-05 ENCOUNTER — Other Ambulatory Visit: Payer: Self-pay

## 2020-07-05 DIAGNOSIS — C3411 Malignant neoplasm of upper lobe, right bronchus or lung: Secondary | ICD-10-CM

## 2020-07-09 ENCOUNTER — Telehealth: Payer: Self-pay | Admitting: Radiation Therapy

## 2020-07-09 ENCOUNTER — Other Ambulatory Visit: Payer: Self-pay | Admitting: Radiation Therapy

## 2020-07-09 NOTE — Telephone Encounter (Signed)
Left voicemail requesting Randy Andersen to call back in order to get his missed MRI and follow-up with [name] rescheduled.   Mont Dutton R.T.(R)(T) Radiation Special Procedures Navigator

## 2020-07-19 ENCOUNTER — Other Ambulatory Visit (HOSPITAL_COMMUNITY): Payer: Non-veteran care

## 2020-07-19 ENCOUNTER — Other Ambulatory Visit: Payer: Self-pay | Admitting: Physician Assistant

## 2020-07-19 DIAGNOSIS — C3411 Malignant neoplasm of upper lobe, right bronchus or lung: Secondary | ICD-10-CM

## 2020-07-21 ENCOUNTER — Other Ambulatory Visit: Payer: Self-pay

## 2020-07-21 ENCOUNTER — Inpatient Hospital Stay (HOSPITAL_BASED_OUTPATIENT_CLINIC_OR_DEPARTMENT_OTHER): Payer: No Typology Code available for payment source | Admitting: Internal Medicine

## 2020-07-21 ENCOUNTER — Inpatient Hospital Stay: Payer: No Typology Code available for payment source

## 2020-07-21 ENCOUNTER — Other Ambulatory Visit: Payer: Self-pay | Admitting: Internal Medicine

## 2020-07-21 VITALS — BP 153/88 | HR 88 | Temp 97.9°F | Resp 17 | Ht 74.0 in | Wt 183.6 lb

## 2020-07-21 DIAGNOSIS — Z5112 Encounter for antineoplastic immunotherapy: Secondary | ICD-10-CM

## 2020-07-21 DIAGNOSIS — C3490 Malignant neoplasm of unspecified part of unspecified bronchus or lung: Secondary | ICD-10-CM | POA: Diagnosis not present

## 2020-07-21 DIAGNOSIS — C3411 Malignant neoplasm of upper lobe, right bronchus or lung: Secondary | ICD-10-CM

## 2020-07-21 LAB — CBC WITH DIFFERENTIAL (CANCER CENTER ONLY)
Abs Immature Granulocytes: 0.02 10*3/uL (ref 0.00–0.07)
Basophils Absolute: 0 10*3/uL (ref 0.0–0.1)
Basophils Relative: 1 %
Eosinophils Absolute: 0 10*3/uL (ref 0.0–0.5)
Eosinophils Relative: 1 %
HCT: 32.4 % — ABNORMAL LOW (ref 39.0–52.0)
Hemoglobin: 11.1 g/dL — ABNORMAL LOW (ref 13.0–17.0)
Immature Granulocytes: 0 %
Lymphocytes Relative: 26 %
Lymphs Abs: 1.2 10*3/uL (ref 0.7–4.0)
MCH: 31.3 pg (ref 26.0–34.0)
MCHC: 34.3 g/dL (ref 30.0–36.0)
MCV: 91.3 fL (ref 80.0–100.0)
Monocytes Absolute: 0.7 10*3/uL (ref 0.1–1.0)
Monocytes Relative: 15 %
Neutro Abs: 2.6 10*3/uL (ref 1.7–7.7)
Neutrophils Relative %: 57 %
Platelet Count: 371 10*3/uL (ref 150–400)
RBC: 3.55 MIL/uL — ABNORMAL LOW (ref 4.22–5.81)
RDW: 16.9 % — ABNORMAL HIGH (ref 11.5–15.5)
WBC Count: 4.6 10*3/uL (ref 4.0–10.5)
nRBC: 0 % (ref 0.0–0.2)

## 2020-07-21 LAB — CMP (CANCER CENTER ONLY)
ALT: 34 U/L (ref 0–44)
AST: 28 U/L (ref 15–41)
Albumin: 3.6 g/dL (ref 3.5–5.0)
Alkaline Phosphatase: 93 U/L (ref 38–126)
Anion gap: 7 (ref 5–15)
BUN: 16 mg/dL (ref 8–23)
CO2: 25 mmol/L (ref 22–32)
Calcium: 9.4 mg/dL (ref 8.9–10.3)
Chloride: 107 mmol/L (ref 98–111)
Creatinine: 1.61 mg/dL — ABNORMAL HIGH (ref 0.61–1.24)
GFR, Estimated: 47 mL/min — ABNORMAL LOW (ref >60)
Glucose, Bld: 101 mg/dL — ABNORMAL HIGH (ref 70–99)
Potassium: 3.8 mmol/L (ref 3.5–5.1)
Sodium: 139 mmol/L (ref 135–145)
Total Bilirubin: 0.3 mg/dL (ref 0.3–1.2)
Total Protein: 7.7 g/dL (ref 6.5–8.1)

## 2020-07-21 LAB — TSH: TSH: 2.484 u[IU]/mL (ref 0.320–4.118)

## 2020-07-21 MED ORDER — SODIUM CHLORIDE 0.9 % IV SOLN
Freq: Once | INTRAVENOUS | Status: AC
  Filled 2020-07-21: qty 250

## 2020-07-21 MED ORDER — SODIUM CHLORIDE 0.9 % IV SOLN
200.0000 mg | Freq: Once | INTRAVENOUS | Status: AC
  Administered 2020-07-21: 200 mg via INTRAVENOUS
  Filled 2020-07-21: qty 8

## 2020-07-21 MED ORDER — CYANOCOBALAMIN 1000 MCG/ML IJ SOLN: 1000.0000 ug | Freq: Once | INTRAMUSCULAR | Status: DC

## 2020-07-21 MED ORDER — PROCHLORPERAZINE MALEATE 10 MG PO TABS: 10.0000 mg | ORAL_TABLET | Freq: Once | ORAL | Status: DC

## 2020-07-21 NOTE — Progress Notes (Signed)
[name] Telephone:(336) [phone]   Fax:(336) [place] [address] [place] [address]  DIAGNOSIS: Stage IV non-small cell lung cancer, favored to be adenocarcinoma. He presented with posterior right upper lobe nodule as well as right hilar, infrahilar, subcarinal, and right paratracheal lymphadenopathy. He also has a 1.4 cm x 1 cm soft tissue nodule in the skin/subcutaneous fat in the left axilla. 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 He was diagnosed in August 2021  Molecular Biomarkers: BIOMARKER(S)         % CFDNA OR AMPLIFICATION       ASSOCIATED FDA-APPROVED THERAPIES        CLINICAL TRIAL AVAILABILITY [id number] 2.9% None    Yes [id number] 0.4% None    Yes [id number] 0.2% None    Yes  PRIOR THERAPY:  1) SRS to the small metastatic brain lesion under the care of Dr. [name] on 03/18/20  CURRENT THERAPY: Palliative systemic chemotherapy with carboplatin for an AUC of 5, Alimta 500 mg/m2, and Keytruda 200 mg IV every 3 weeks. First dose expected on 04/07/20.  Status post 5 cycles.  Starting from cycle #5 he will be on maintenance treatment with Alimta and Keytruda every 3 weeks  INTERVAL HISTORY: Randy Andersen 64 y.o. male returns to the clinic today for follow-up visit.  The patient is feeling fine today with no concerning complaints except for mild fatigue.  He denied having any chest pain, shortness of breath, cough or hemoptysis.  He denied having any fever or chills.  He has no nausea, vomiting, diarrhea or constipation.  He denied having any headache or visual changes.  He has no weight loss or night sweats.  He continues to tolerate his treatment with maintenance Alimta and Keytruda fairly well.  The patient is here today for evaluation before starting cycle #6 of his treatment.  MEDICAL HISTORY: Past Medical History:  Diagnosis Date  . Asthma     as a child  . Chronic pain disorder    LT leg and foot  . Heart murmur    as a child  . Hypertension   . Malignant neoplasm of upper lobe of right lung ([place])   . Stroke [facility])    mini stroke - 2015, some tingling in fingers on right     ALLERGIES:  has No Known Allergies.  MEDICATIONS:  Current Outpatient Medications  Medication Sig Dispense Refill  . amLODipine (NORVASC) 10 MG tablet Take 10 mg by mouth daily.    Marland Kitchen doxylamine, Sleep, (UNISOM) 25 MG tablet Take 25 mg by mouth at bedtime as needed for sleep.  (Patient not taking: Reported on 06/30/2020)    . folic acid (FOLVITE) 1 MG tablet Take 1 tablet (1 mg total) by mouth daily. 30 tablet 2  . HYDROcodone-acetaminophen (NORCO) 10-325 MG tablet Take 1 tablet by mouth 2 (two) times daily.    . phenazopyridine (PYRIDIUM) 200 MG tablet Take 1 tablet (200 mg total) by mouth 3 (three) times daily. 6 tablet 0  . prochlorperazine (COMPAZINE) 10 MG tablet Take 1 tablet (10 mg total) by mouth every 6 (six) hours as needed. 30 tablet 2  . RIVAROXABAN (XARELTO) VTE STARTER PACK (15 & 20 MG TABLETS) Follow package directions: Take one 15mg  tablet by mouth twice a day. On day 22, switch to one 20mg  tablet once a day. Take with food. 51 each 0  .  tamsulosin (FLOMAX) 0.4 MG CAPS capsule Take 1 capsule (0.4 mg total) by mouth daily. 30 capsule 2  . temazepam (RESTORIL) 15 MG capsule Take 1 capsule (15 mg total) by mouth at bedtime as needed for sleep. Pt is veteran - Call pt to set up delivery 30 capsule 0   No current facility-administered medications for this visit.    SURGICAL HISTORY:  Past Surgical History:  Procedure Laterality Date  . BUNIONECTOMY Left    had hammer toe surgery also and callus removed  . BUNIONECTOMY Right    also had hammer toe surgery and callus removed  . LEG SURGERY Left    PT reports he has pins placed in his Lt leg  . ORIF TIBIA PLATEAU  10/09/2012   Dr Lorin Mercy  . ORIF TIBIA PLATEAU Left 10/08/2012   Procedure:  OPEN REDUCTION INTERNAL FIXATION (ORIF) TIBIAL PLATEAU;  Surgeon: Marybelle Killings, MD;  Location: [facility];  Service: Orthopedics;  Laterality: Left;  Marland Kitchen VIDEO BRONCHOSCOPY WITH ENDOBRONCHIAL NAVIGATION N/A 02/25/2020   Procedure: VIDEO BRONCHOSCOPY WITH ENDOBRONCHIAL NAVIGATION with biopsies;  Surgeon: Collene Gobble, MD;  Location: [facility];  Service: Thoracic;  Laterality: N/A;  . VIDEO BRONCHOSCOPY WITH ENDOBRONCHIAL ULTRASOUND N/A 02/25/2020   Procedure: VIDEO BRONCHOSCOPY WITH ENDOBRONCHIAL ULTRASOUND;  Surgeon: Collene Gobble, MD;  Location: [facility];  Service: Thoracic;  Laterality: N/A;    REVIEW OF SYSTEMS:  A comprehensive review of systems was negative except for: Constitutional: positive for fatigue   PHYSICAL EXAMINATION: General appearance: alert, cooperative, fatigued and no distress Head: Normocephalic, without obvious abnormality, atraumatic Neck: no adenopathy, no JVD, supple, symmetrical, trachea midline and thyroid not enlarged, symmetric, no tenderness/mass/nodules Lymph nodes: Cervical, supraclavicular, and axillary nodes normal. Resp: clear to auscultation bilaterally Back: symmetric, no curvature. ROM normal. No CVA tenderness. Cardio: regular rate and rhythm, S1, S2 normal, no murmur, click, rub or gallop GI: soft, non-tender; bowel sounds normal; no masses,  no organomegaly Extremities: extremities normal, atraumatic, no cyanosis or edema  ECOG PERFORMANCE STATUS: 1 - Symptomatic but completely ambulatory  Blood pressure (!) 153/88, pulse 88, temperature 97.9 F (36.6 C), temperature source Tympanic, resp. rate 17, height 6\' 2"  (1.88 m), weight 183 lb 9.6 oz (83.3 kg), SpO2 99 %.  LABORATORY DATA: Lab Results  Component Value Date   WBC 4.1 06/30/2020   HGB 12.1 (L) 06/30/2020   HCT 36.0 (L) 06/30/2020   MCV 91.4 06/30/2020   PLT 145 (L) 06/30/2020      Chemistry      Component Value Date/Time   NA 138 06/30/2020 1122   K 3.8 06/30/2020 1122   CL 107 06/30/2020 1122    CO2 20 (L) 06/30/2020 1122   BUN 19 06/30/2020 1122   CREATININE 1.65 (H) 06/30/2020 1122      Component Value Date/Time   CALCIUM 9.8 06/30/2020 1122   ALKPHOS 110 06/30/2020 1122   AST 21 06/30/2020 1122   ALT 16 06/30/2020 1122   BILITOT 0.4 06/30/2020 1122       RADIOGRAPHIC STUDIES: No results found.  ASSESSMENT AND PLAN: This is a very pleasant 64 years old African-American male diagnosed with stage IV non-small cell lung cancer, adenocarcinoma presented with right upper lobe nodule in addition to right hilar, infrahilar, subcarinal and right paratracheal adenopathy in addition to subcutaneous nodules in the left axilla and 2 small metastatic brain lesion diagnosed in August 2021.  The patient has no actionable mutations. He is currently undergoing systemic chemotherapy with carboplatin  for AUC of 5, Alimta 500 mg/M2 and Keytruda 200 mg IV every 3 weeks.  Status post 5 cycles.  Starting from cycle #5 he will be on maintenance treatment with Alimta and Keytruda every 3 weeks.  I reduce the dose of his Alimta to 400 mg/M2 secondary to the renal insufficiency. The patient continues to tolerate this treatment well with no concerning adverse effects. I recommended for him to proceed with cycle #6 today as planned. I will see him back for follow-up visit in 3 weeks for evaluation with repeat CT scan of the chest, abdomen pelvis for restaging of his disease. For the recently diagnosed pulmonary embolism, he will continue his current treatment with Xarelto. For the suspicious thyroid neoplasm, he is followed by Dr. Harlow Asa and expected to have surgery in January 2022. The patient was advised to call immediately if he has any concerning symptoms in the interval. The patient voices understanding of current disease status and treatment options and is in agreement with the current care plan.  All questions were answered. The patient knows to call the clinic with any problems, questions or  concerns. We can certainly see the patient much sooner if necessary.   Disclaimer: This note was dictated with voice recognition software. Similar sounding words can inadvertently be transcribed and may not be corrected upon review.

## 2020-07-21 NOTE — Patient Instructions (Addendum)
[facility] Discharge Instructions for Patients Receiving Chemotherapy  Today you received the following chemotherapy agents: pembrolizumab.  To help prevent nausea and vomiting after your treatment, we encourage you to take your nausea medication as directed.   If you develop nausea and vomiting that is not controlled by your nausea medication, call the clinic.   BELOW ARE SYMPTOMS THAT SHOULD BE REPORTED IMMEDIATELY:  *FEVER GREATER THAN 100.5 F  *CHILLS WITH OR WITHOUT FEVER  NAUSEA AND VOMITING THAT IS NOT CONTROLLED WITH YOUR NAUSEA MEDICATION  *UNUSUAL SHORTNESS OF BREATH  *UNUSUAL BRUISING OR BLEEDING  TENDERNESS IN MOUTH AND THROAT WITH OR WITHOUT PRESENCE OF ULCERS  *URINARY PROBLEMS  *BOWEL PROBLEMS  UNUSUAL RASH Items with * indicate a potential emergency and should be followed up as soon as possible.  Feel free to call the clinic should you have any questions or concerns. The clinic phone number is [phone].  Please show the CHEMO ALERT CARD at check-in to the Emergency Department and triage nurse.   

## 2020-07-21 NOTE — Progress Notes (Signed)
Per Dr Julien Nordmann, pembrolizumab only today. Holding pemetrexed due to elevated creatinine.

## 2020-07-22 ENCOUNTER — Telehealth: Payer: Self-pay | Admitting: Internal Medicine

## 2020-07-22 NOTE — Telephone Encounter (Signed)
Scheduled 2 more rounds of infusions per 12/29 los. Patient is aware. Mailed calendar to patient per patient's request.

## 2020-07-27 ENCOUNTER — Other Ambulatory Visit (HOSPITAL_COMMUNITY): Payer: Non-veteran care

## 2020-07-29 ENCOUNTER — Other Ambulatory Visit: Payer: Self-pay

## 2020-07-29 ENCOUNTER — Ambulatory Visit (HOSPITAL_COMMUNITY): Admission: RE | Admit: 2020-07-29 | Payer: No Typology Code available for payment source | Source: Ambulatory Visit

## 2020-07-29 ENCOUNTER — Encounter: Payer: Self-pay | Admitting: Radiation Oncology

## 2020-07-30 ENCOUNTER — Encounter: Payer: Self-pay | Admitting: Internal Medicine

## 2020-07-30 ENCOUNTER — Ambulatory Visit: Admit: 2020-07-30 | Payer: Non-veteran care | Admitting: Surgery

## 2020-07-30 ENCOUNTER — Encounter: Payer: Self-pay | Admitting: General Practice

## 2020-07-30 SURGERY — LOBECTOMY, THYROID
Anesthesia: General | Laterality: Left

## 2020-07-30 NOTE — Progress Notes (Signed)
Called patient to provide grant balance and what is needed to use towards housing. He verbalized understanding.

## 2020-07-30 NOTE — Progress Notes (Signed)
[place] [facility] Psychosocial Distress Screening Clinical Social Work  Clinical Social Work was referred by distress screening protocol.  The patient scored a 7 on the Psychosocial Distress Thermometer which indicates moderate distress. Clinical Social Worker contacted patient by phone to assess for distress and other psychosocial needs. Patient has to move into a new apartment - he does receive rental assistance from the [employer] program and has a Product/process development scientist.  Caseworker is working with him to find a new apartment.  He has to move out by mid February.  Per patient, caseworker directed patient to ask [place] for assistance w paying for the deposit.  CSW placed call to caseworker Jonetta Osgood [phone]) to clarify which assistance is needed.  Also messaged his Financial Advocate Red Christians so she can investigate options under his [employer].    ONCBCN DISTRESS SCREENING 07/29/2020  Screening Type Initial Screening  Distress experienced in past week (1-10) 7  Practical problem type Housing  Emotional problem type   Other     Clinical Social Worker follow up needed: Yes.    If yes, follow up plan:  Beverely Pace, [place], [place] Social Worker Phone:  [phone] Cell:  [phone]

## 2020-08-03 ENCOUNTER — Inpatient Hospital Stay
Admission: RE | Admit: 2020-08-03 | Discharge: 2020-08-03 | Disposition: A | Discharge status: Home / Self Care | Payer: Self-pay | Source: Ambulatory Visit | Attending: Radiation Oncology | Admitting: Radiation Oncology

## 2020-08-03 DIAGNOSIS — C7931 Secondary malignant neoplasm of brain: Secondary | ICD-10-CM

## 2020-08-03 DIAGNOSIS — C3411 Malignant neoplasm of upper lobe, right bronchus or lung: Secondary | ICD-10-CM

## 2020-08-09 ENCOUNTER — Ambulatory Visit (HOSPITAL_COMMUNITY): Payer: No Typology Code available for payment source

## 2020-08-10 ENCOUNTER — Other Ambulatory Visit: Payer: Self-pay | Admitting: Medical Oncology

## 2020-08-10 ENCOUNTER — Inpatient Hospital Stay: Payer: No Typology Code available for payment source

## 2020-08-10 ENCOUNTER — Other Ambulatory Visit: Payer: Self-pay

## 2020-08-10 ENCOUNTER — Inpatient Hospital Stay: Payer: No Typology Code available for payment source | Attending: Internal Medicine | Admitting: Internal Medicine

## 2020-08-10 ENCOUNTER — Encounter: Payer: Self-pay | Admitting: Internal Medicine

## 2020-08-10 VITALS — BP 136/91 | HR 88 | Temp 97.6°F | Resp 17 | Ht 74.0 in | Wt 186.5 lb

## 2020-08-10 DIAGNOSIS — C7931 Secondary malignant neoplasm of brain: Secondary | ICD-10-CM | POA: Diagnosis not present

## 2020-08-10 DIAGNOSIS — C778 Secondary and unspecified malignant neoplasm of lymph nodes of multiple regions: Secondary | ICD-10-CM | POA: Insufficient documentation

## 2020-08-10 DIAGNOSIS — C3411 Malignant neoplasm of upper lobe, right bronchus or lung: Secondary | ICD-10-CM | POA: Insufficient documentation

## 2020-08-10 DIAGNOSIS — Z5111 Encounter for antineoplastic chemotherapy: Secondary | ICD-10-CM | POA: Insufficient documentation

## 2020-08-10 DIAGNOSIS — Z5112 Encounter for antineoplastic immunotherapy: Secondary | ICD-10-CM | POA: Insufficient documentation

## 2020-08-10 DIAGNOSIS — Z79899 Other long term (current) drug therapy: Secondary | ICD-10-CM | POA: Insufficient documentation

## 2020-08-10 LAB — CBC WITH DIFFERENTIAL (CANCER CENTER ONLY)
Abs Immature Granulocytes: 0.04 10*3/uL (ref 0.00–0.07)
Basophils Absolute: 0.1 10*3/uL (ref 0.0–0.1)
Basophils Relative: 1 %
Eosinophils Absolute: 0.2 10*3/uL (ref 0.0–0.5)
Eosinophils Relative: 4 %
HCT: 35.3 % — ABNORMAL LOW (ref 39.0–52.0)
Hemoglobin: 11.9 g/dL — ABNORMAL LOW (ref 13.0–17.0)
Immature Granulocytes: 1 %
Lymphocytes Relative: 28 %
Lymphs Abs: 1.4 10*3/uL (ref 0.7–4.0)
MCH: 31.9 pg (ref 26.0–34.0)
MCHC: 33.7 g/dL (ref 30.0–36.0)
MCV: 94.6 fL (ref 80.0–100.0)
Monocytes Absolute: 0.7 10*3/uL (ref 0.1–1.0)
Monocytes Relative: 13 %
Neutro Abs: 2.8 10*3/uL (ref 1.7–7.7)
Neutrophils Relative %: 53 %
Platelet Count: 149 10*3/uL — ABNORMAL LOW (ref 150–400)
RBC: 3.73 MIL/uL — ABNORMAL LOW (ref 4.22–5.81)
RDW: 15.5 % (ref 11.5–15.5)
WBC Count: 5.2 10*3/uL (ref 4.0–10.5)
nRBC: 0 % (ref 0.0–0.2)

## 2020-08-10 LAB — CMP (CANCER CENTER ONLY)
ALT: 21 U/L (ref 0–44)
AST: 28 U/L (ref 15–41)
Albumin: 3.9 g/dL (ref 3.5–5.0)
Alkaline Phosphatase: 88 U/L (ref 38–126)
Anion gap: 10 (ref 5–15)
BUN: 18 mg/dL (ref 8–23)
CO2: 22 mmol/L (ref 22–32)
Calcium: 9.4 mg/dL (ref 8.9–10.3)
Chloride: 107 mmol/L (ref 98–111)
Creatinine: 1.36 mg/dL — ABNORMAL HIGH (ref 0.61–1.24)
GFR, Estimated: 58 mL/min — ABNORMAL LOW (ref >60)
Glucose, Bld: 134 mg/dL — ABNORMAL HIGH (ref 70–99)
Potassium: 3.7 mmol/L (ref 3.5–5.1)
Sodium: 139 mmol/L (ref 135–145)
Total Bilirubin: 0.2 mg/dL — ABNORMAL LOW (ref 0.3–1.2)
Total Protein: 8 g/dL (ref 6.5–8.1)

## 2020-08-10 LAB — TSH: TSH: 2.725 u[IU]/mL (ref 0.320–4.118)

## 2020-08-10 MED ORDER — SODIUM CHLORIDE 0.9 % IV SOLN
400.0000 mg/m2 | Freq: Once | INTRAVENOUS | Status: AC
  Administered 2020-08-10: 800 mg via INTRAVENOUS
  Filled 2020-08-10: qty 12

## 2020-08-10 MED ORDER — SODIUM CHLORIDE 0.9 % IV SOLN
Freq: Once | INTRAVENOUS | Status: AC
  Filled 2020-08-10: qty 250

## 2020-08-10 MED ORDER — PROCHLORPERAZINE MALEATE 10 MG PO TABS
10.0000 mg | ORAL_TABLET | Freq: Once | ORAL | Status: AC
  Administered 2020-08-10: 10 mg via ORAL

## 2020-08-10 MED ORDER — RIVAROXABAN 20 MG PO TABS: 20.0000 mg | ORAL_TABLET | Freq: Every day | ORAL | 2 refills | Status: DC

## 2020-08-10 MED ORDER — CYANOCOBALAMIN 1000 MCG/ML IJ SOLN
1000.0000 ug | Freq: Once | INTRAMUSCULAR | Status: AC
  Administered 2020-08-10: 1000 ug via INTRAMUSCULAR

## 2020-08-10 MED ORDER — PROCHLORPERAZINE MALEATE 10 MG PO TABS
ORAL_TABLET | ORAL | Status: AC
  Filled 2020-08-10: qty 1

## 2020-08-10 MED ORDER — SODIUM CHLORIDE 0.9 % IV SOLN
200.0000 mg | Freq: Once | INTRAVENOUS | Status: AC
  Administered 2020-08-10: 200 mg via INTRAVENOUS
  Filled 2020-08-10: qty 8

## 2020-08-10 MED ORDER — FOLIC ACID 1 MG PO TABS: 1.0000 mg | ORAL_TABLET | Freq: Every day | ORAL | 2 refills | Status: DC

## 2020-08-10 MED ORDER — CYANOCOBALAMIN 1000 MCG/ML IJ SOLN
INTRAMUSCULAR | Status: AC
  Filled 2020-08-10: qty 1

## 2020-08-10 NOTE — Patient Instructions (Signed)
[facility] Discharge Instructions for Patients Receiving Chemotherapy  Today you received the following chemotherapy agents Pembrolizumab (KEYTRUDA) & Pemetrexed (ALIMTA).  To help prevent nausea and vomiting after your treatment, we encourage you to take your nausea medication as prescribed.   If you develop nausea and vomiting that is not controlled by your nausea medication, call the clinic.   BELOW ARE SYMPTOMS THAT SHOULD BE REPORTED IMMEDIATELY:  *FEVER GREATER THAN 100.5 F  *CHILLS WITH OR WITHOUT FEVER  NAUSEA AND VOMITING THAT IS NOT CONTROLLED WITH YOUR NAUSEA MEDICATION  *UNUSUAL SHORTNESS OF BREATH  *UNUSUAL BRUISING OR BLEEDING  TENDERNESS IN MOUTH AND THROAT WITH OR WITHOUT PRESENCE OF ULCERS  *URINARY PROBLEMS  *BOWEL PROBLEMS  UNUSUAL RASH Items with * indicate a potential emergency and should be followed up as soon as possible.  Feel free to call the clinic should you have any questions or concerns. The clinic phone number is [phone].  Please show the [facility] at check-in to the Emergency Department and triage nurse.  Cyanocobalamin, Vitamin B12 injection What is this medicine? CYANOCOBALAMIN (sye an oh koe BAL a min) is a man made form of vitamin B12. Vitamin B12 is used in the growth of healthy blood cells, nerve cells, and proteins in the body. It also helps with the metabolism of fats and carbohydrates. This medicine is used to treat people who can not absorb vitamin B12. This medicine may be used for other purposes; ask your health care provider or pharmacist if you have questions. COMMON BRAND NAME(S): B-12 Compliance Kit, B-12 Injection Kit, Cyomin, LA-12, Nutri-Twelve, Physicians EZ Use B-12, Primabalt What should I tell my health care provider before I take this medicine? They need to know if you have any of these conditions:  kidney disease  Leber's disease  megaloblastic anemia  an unusual or allergic reaction  to cyanocobalamin, cobalt, other medicines, foods, dyes, or preservatives  pregnant or trying to get pregnant  breast-feeding How should I use this medicine? This medicine is injected into a muscle or deeply under the skin. It is usually given by a health care professional in a clinic or doctor's office. However, your doctor may teach you how to inject yourself. Follow all instructions. Talk to your pediatrician regarding the use of this medicine in children. Special care may be needed. Overdosage: If you think you have taken too much of this medicine contact a poison control center or emergency room at once. NOTE: This medicine is only for you. Do not share this medicine with others. What if I miss a dose? If you are given your dose at a clinic or doctor's office, call to reschedule your appointment. If you give your own injections and you miss a dose, take it as soon as you can. If it is almost time for your next dose, take only that dose. Do not take double or extra doses. What may interact with this medicine?  colchicine  heavy alcohol intake This list may not describe all possible interactions. Give your health care provider a list of all the medicines, herbs, non-prescription drugs, or dietary supplements you use. Also tell them if you smoke, drink alcohol, or use illegal drugs. Some items may interact with your medicine. What should I watch for while using this medicine? Visit your doctor or health care professional regularly. You may need blood work done while you are taking this medicine. You may need to follow a special diet. Talk to your doctor. Limit  your alcohol intake and avoid smoking to get the best benefit. What side effects may I notice from receiving this medicine? Side effects that you should report to your doctor or health care professional as soon as possible:  allergic reactions like skin rash, itching or hives, swelling of the face, lips, or tongue  blue tint to  skin  chest tightness, pain  difficulty breathing, wheezing  dizziness  red, swollen painful area on the leg Side effects that usually do not require medical attention (report to your doctor or health care professional if they continue or are bothersome):  diarrhea  headache This list may not describe all possible side effects. Call your doctor for medical advice about side effects. You may report side effects to FDA at 1-800-FDA-1088. Where should I keep my medicine? Keep out of the reach of children. Store at room temperature between 15 and 30 degrees C (59 and 85 degrees F). Protect from light. Throw away any unused medicine after the expiration date. NOTE: This sheet is a summary. It may not cover all possible information. If you have questions about this medicine, talk to your doctor, pharmacist, or health care provider.  2021 Elsevier/Gold Standard (2007-10-21 22:10:20)

## 2020-08-10 NOTE — Patient Instructions (Signed)
Steps to Quit Smoking Smoking tobacco is the leading cause of preventable death. It can affect almost every organ in the body. Smoking puts you and people around you at risk for many serious, long-lasting (chronic) diseases. Quitting smoking can be hard, but it is one of the best things that you can do for your health. It is never too late to quit. How do I get ready to quit? When you decide to quit smoking, make a plan to help you succeed. Before you quit:  Pick a date to quit. Set a date within the next 2 weeks to give you time to prepare.  Write down the reasons why you are quitting. Keep this list in places where you will see it often.  Tell your family, friends, and co-workers that you are quitting. Their support is important.  Talk with your doctor about the choices that may help you quit.  Find out if your health insurance will pay for these treatments.  Know the people, places, things, and activities that make you want to smoke (triggers). Avoid them. What first steps can I take to quit smoking?  Throw away all cigarettes at home, at work, and in your car.  Throw away the things that you use when you smoke, such as ashtrays and lighters.  Clean your car. Make sure to empty the ashtray.  Clean your home, including curtains and carpets. What can I do to help me quit smoking? Talk with your doctor about taking medicines and seeing a counselor at the same time. You are more likely to succeed when you do both.  If you are pregnant or breastfeeding, talk with your doctor about counseling or other ways to quit smoking. Do not take medicine to help you quit smoking unless your doctor tells you to do so. To quit smoking: Quit right away  Quit smoking totally, instead of slowly cutting back on how much you smoke over a period of time.  Go to counseling. You are more likely to quit if you go to counseling sessions regularly. Take medicine You may take medicines to help you quit. Some  medicines need a prescription, and some you can buy over-the-counter. Some medicines may contain a drug called nicotine to replace the nicotine in cigarettes. Medicines may:  Help you to stop having the desire to smoke (cravings).  Help to stop the problems that come when you stop smoking (withdrawal symptoms). Your doctor may ask you to use:  Nicotine patches, gum, or lozenges.  Nicotine inhalers or sprays.  Non-nicotine medicine that is taken by mouth. Find resources Find resources and other ways to help you quit smoking and remain smoke-free after you quit. These resources are most helpful when you use them often. They include:  Online chats with a counselor.  Phone quitlines.  Printed self-help materials.  Support groups or group counseling.  Text messaging programs.  Mobile phone apps. Use apps on your mobile phone or tablet that can help you stick to your quit plan. There are many free apps for mobile phones and tablets as well as websites. Examples include Quit Guide from the CDC and smokefree.gov   What things can I do to make it easier to quit?  Talk to your family and friends. Ask them to support and encourage you.  Call a phone quitline (1-800-QUIT-NOW), reach out to support groups, or work with a counselor.  Ask people who smoke to not smoke around you.  Avoid places that make you want to smoke,   such as: ? Bars. ? Parties. ? Smoke-break areas at work.  Spend time with people who do not smoke.  Lower the stress in your life. Stress can make you want to smoke. Try these things to help your stress: ? Getting regular exercise. ? Doing deep-breathing exercises. ? Doing yoga. ? Meditating. ? Doing a body scan. To do this, close your eyes, focus on one area of your body at a time from head to toe. Notice which parts of your body are tense. Try to relax the muscles in those areas.   How will I feel when I quit smoking? Day 1 to 3 weeks Within the first 24 hours,  you may start to have some problems that come from quitting tobacco. These problems are very bad 2-3 days after you quit, but they do not often last for more than 2-3 weeks. You may get these symptoms:  Mood swings.  Feeling restless, nervous, angry, or annoyed.  Trouble concentrating.  Dizziness.  Strong desire for high-sugar foods and nicotine.  Weight gain.  Trouble pooping (constipation).  Feeling like you may vomit (nausea).  Coughing or a sore throat.  Changes in how the medicines that you take for other issues work in your body.  Depression.  Trouble sleeping (insomnia). Week 3 and afterward After the first 2-3 weeks of quitting, you may start to notice more positive results, such as:  Better sense of smell and taste.  Less coughing and sore throat.  Slower heart rate.  Lower blood pressure.  Clearer skin.  Better breathing.  Fewer sick days. Quitting smoking can be hard. Do not give up if you fail the first time. Some people need to try a few times before they succeed. Do your best to stick to your quit plan, and talk with your doctor if you have any questions or concerns. Summary  Smoking tobacco is the leading cause of preventable death. Quitting smoking can be hard, but it is one of the best things that you can do for your health.  When you decide to quit smoking, make a plan to help you succeed.  Quit smoking right away, not slowly over a period of time.  When you start quitting, seek help from your doctor, family, or friends. This information is not intended to replace advice given to you by your health care provider. Make sure you discuss any questions you have with your health care provider. Document Revised: 04/04/2019 Document Reviewed: 09/28/2018 Elsevier Patient Education  2021 Elsevier Inc.  

## 2020-08-10 NOTE — Progress Notes (Signed)
[place] Telephone:([phone]) [phone]   Fax:(336) [place] [address] [place] [address]  DIAGNOSIS: Stage IV non-small cell lung cancer, favored to be adenocarcinoma. He presented with posterior right upper lobe nodule as well as right hilar, infrahilar, subcarinal, and right paratracheal lymphadenopathy. He also has a 1.4 cm x 1 cm soft tissue nodule in the skin/subcutaneous fat in the left axilla. 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 He was diagnosed in August 2021  Molecular Biomarkers: BIOMARKER(S)         % CFDNA OR AMPLIFICATION       ASSOCIATED FDA-APPROVED THERAPIES        CLINICAL TRIAL AVAILABILITY [id number] 2.9% None    Yes [id number] 0.4% None    Yes [id number] 0.2% None    Yes  PRIOR THERAPY:  1) SRS to the small metastatic brain lesion under the care of Dr. [name] on 03/18/20  CURRENT THERAPY: Palliative systemic chemotherapy with carboplatin for an AUC of 5, Alimta 500 mg/m2, and Keytruda 200 mg IV every 3 weeks. First dose expected on 04/07/20.  Status post 6 cycles.  Starting from cycle #5 he will be on maintenance treatment with Alimta and Keytruda every 3 weeks  INTERVAL HISTORY: Randy Andersen 65 y.o. male returns to the clinic today for follow-up visit.  The patient is feeling fine today with no concerning complaints.  He denied having any current chest pain, shortness of breath, cough or hemoptysis.  He denied having any nausea, vomiting, diarrhea or constipation.  He has no headache or visual changes.  He has no recent weight loss or night sweats.  He continues to tolerate his maintenance treatment with Alimta and Keytruda fairly well.  The patient is here today for evaluation before starting cycle #7.  He was supposed to have repeat CT scan of the chest, abdomen and pelvis before this visit but unfortunately he did not schedule the  appointment.  He also was running out of his medication for folic acid and Xarelto and he did not request refill.   MEDICAL HISTORY: Past Medical History:  Diagnosis Date  . Asthma    as a child  . Chronic pain disorder    LT leg and foot  . Heart murmur    as a child  . Hypertension   . Malignant neoplasm of upper lobe of right lung ([place])   . Stroke [facility])    mini stroke - 2015, some tingling in fingers on right     ALLERGIES:  has No Known Allergies.  MEDICATIONS:  Current Outpatient Medications  Medication Sig Dispense Refill  . amLODipine (NORVASC) 10 MG tablet Take 10 mg by mouth daily.    Marland Kitchen doxylamine, Sleep, (UNISOM) 25 MG tablet Take 25 mg by mouth at bedtime as needed for sleep.    . folic acid (FOLVITE) 1 MG tablet Take 1 tablet (1 mg total) by mouth daily. 30 tablet 2  . HYDROcodone-acetaminophen (NORCO) 10-325 MG tablet Take 1 tablet by mouth 2 (two) times daily.    . phenazopyridine (PYRIDIUM) 200 MG tablet Take 1 tablet (200 mg total) by mouth 3 (three) times daily. 6 tablet 0  . prochlorperazine (COMPAZINE) 10 MG tablet Take 1 tablet (10 mg total) by mouth every 6 (six) hours as needed. 30 tablet 2  . RIVAROXABAN (XARELTO) VTE STARTER PACK (15 & 20 MG TABLETS) Follow package directions: Take  one 15mg  tablet by mouth twice a day. On day 22, switch to one 20mg  tablet once a day. Take with food. 51 each 0  . tamsulosin (FLOMAX) 0.4 MG CAPS capsule Take 1 capsule (0.4 mg total) by mouth daily. 30 capsule 2  . temazepam (RESTORIL) 15 MG capsule Take 1 capsule (15 mg total) by mouth at bedtime as needed for sleep. Pt is veteran - Call pt to set up delivery (Patient not taking: Reported on 07/29/2020) 30 capsule 0   No current facility-administered medications for this visit.    SURGICAL HISTORY:  Past Surgical History:  Procedure Laterality Date  . BUNIONECTOMY Left    had hammer toe surgery also and callus removed  . BUNIONECTOMY Right    also had hammer toe surgery  and callus removed  . LEG SURGERY Left    PT reports he has pins placed in his Lt leg  . ORIF TIBIA PLATEAU  10/09/2012   Dr Lorin Mercy  . ORIF TIBIA PLATEAU Left 10/08/2012   Procedure: OPEN REDUCTION INTERNAL FIXATION (ORIF) TIBIAL PLATEAU;  Surgeon: Marybelle Killings, MD;  Location: [place];  Service: Orthopedics;  Laterality: Left;  Marland Kitchen VIDEO BRONCHOSCOPY WITH ENDOBRONCHIAL NAVIGATION N/A 02/25/2020   Procedure: VIDEO BRONCHOSCOPY WITH ENDOBRONCHIAL NAVIGATION with biopsies;  Surgeon: Collene Gobble, MD;  Location: [place];  Service: Thoracic;  Laterality: N/A;  . VIDEO BRONCHOSCOPY WITH ENDOBRONCHIAL ULTRASOUND N/A 02/25/2020   Procedure: VIDEO BRONCHOSCOPY WITH ENDOBRONCHIAL ULTRASOUND;  Surgeon: Collene Gobble, MD;  [facility];  Service: Thoracic;  Laterality: N/A;    REVIEW OF SYSTEMS:  A comprehensive review of systems was negative.   PHYSICAL EXAMINATION: General appearance: alert, cooperative, fatigued and no distress Head: Normocephalic, without obvious abnormality, atraumatic Neck: no adenopathy, no JVD, supple, symmetrical, trachea midline and thyroid not enlarged, symmetric, no tenderness/mass/nodules Lymph nodes: Cervical, supraclavicular, and axillary nodes normal. Resp: clear to auscultation bilaterally Back: symmetric, no curvature. ROM normal. No CVA tenderness. Cardio: regular rate and rhythm, S1, S2 normal, no murmur, click, rub or gallop GI: soft, non-tender; bowel sounds normal; no masses,  no organomegaly Extremities: extremities normal, atraumatic, no cyanosis or edema  ECOG PERFORMANCE STATUS: 1 - Symptomatic but completely ambulatory  Blood pressure (!) 136/91, pulse 88, temperature 97.6 F (36.4 C), temperature source Tympanic, resp. rate 17, height 6\' 2"  (1.88 m), weight 186 lb 8 oz (84.6 kg), SpO2 100 %.  LABORATORY DATA: Lab Results  Component Value Date   WBC 5.2 08/10/2020   HGB 11.9 (L) 08/10/2020   HCT 35.3 (L) 08/10/2020   MCV 94.6 08/10/2020   PLT 149 (L)  08/10/2020      Chemistry      Component Value Date/Time   NA 139 07/21/2020 1148   K 3.8 07/21/2020 1148   CL 107 07/21/2020 1148   CO2 25 07/21/2020 1148   BUN 16 07/21/2020 1148   CREATININE 1.61 (H) 07/21/2020 1148      Component Value Date/Time   CALCIUM 9.4 07/21/2020 1148   ALKPHOS 93 07/21/2020 1148   AST 28 07/21/2020 1148   ALT 34 07/21/2020 1148   BILITOT 0.3 07/21/2020 1148       RADIOGRAPHIC STUDIES: No results found.  ASSESSMENT AND PLAN: This is a very pleasant 65 years old African-American male diagnosed with stage IV non-small cell lung cancer, adenocarcinoma presented with right upper lobe nodule in addition to right hilar, infrahilar, subcarinal and right paratracheal adenopathy in addition to subcutaneous nodules in the left  axilla and 2 small metastatic brain lesion diagnosed in August 2021.  The patient has no actionable mutations. He is currently undergoing systemic chemotherapy with carboplatin for AUC of 5, Alimta 500 mg/M2 and Keytruda 200 mg IV every 3 weeks.  Status post 6 cycles.  Starting from cycle #5 he will be on maintenance treatment with Alimta and Keytruda every 3 weeks.  I reduce the dose of his Alimta to 400 mg/M2 secondary to the renal insufficiency. The patient continues to tolerate this treatment well with no concerning adverse effects. I recommended for him to proceed with cycle #7 today as planned. I strongly advised the patient to schedule his CT scan of the chest, abdomen and pelvis that has been already ordered for restaging of his disease before the next cycle of his treatment. For the recently diagnosed pulmonary embolism, he will continue his current treatment with Xarelto.  I will give him refill of Xarelto 20 mg p.o. daily.  I will also give him refill for folic acid. For the suspicious thyroid neoplasm, he is followed by Dr. Harlow Asa and expected to have surgery in January 2022. The patient will come back for follow-up visit in 3  weeks for evaluation before starting cycle #8. The patient voices understanding of current disease status and treatment options and is in agreement with the current care plan.  All questions were answered. The patient knows to call the clinic with any problems, questions or concerns. We can certainly see the patient much sooner if necessary.   Disclaimer: This note was dictated with voice recognition software. Similar sounding words can inadvertently be transcribed and may not be corrected upon review.

## 2020-08-12 ENCOUNTER — Telehealth: Payer: Self-pay | Admitting: Medical Oncology

## 2020-08-12 ENCOUNTER — Telehealth: Payer: Self-pay | Admitting: Internal Medicine

## 2020-08-12 NOTE — Telephone Encounter (Signed)
Scheduled per 1/18 los. Pt will receive an updated appt calendar per next visit appt notes

## 2020-08-12 NOTE — Telephone Encounter (Signed)
Faxed records supporting [facility].

## 2020-08-18 ENCOUNTER — Ambulatory Visit (HOSPITAL_COMMUNITY): Admission: RE | Admit: 2020-08-18 | Payer: No Typology Code available for payment source | Source: Ambulatory Visit

## 2020-08-18 ENCOUNTER — Encounter: Payer: Self-pay | Admitting: Radiation Oncology

## 2020-08-18 ENCOUNTER — Other Ambulatory Visit: Payer: Self-pay

## 2020-08-18 ENCOUNTER — Telehealth: Payer: Self-pay

## 2020-08-18 NOTE — Telephone Encounter (Signed)
Spoke with patient in regards to telephone appointment with [name] PA on 08/24/20 @ 8:30am. Patient verbalized understanding of appointment date and time. Reviewed meaningful use questions. TM

## 2020-08-19 ENCOUNTER — Ambulatory Visit (HOSPITAL_COMMUNITY): Payer: No Typology Code available for payment source

## 2020-08-20 ENCOUNTER — Telehealth: Payer: Self-pay | Admitting: *Deleted

## 2020-08-20 NOTE — Telephone Encounter (Signed)
TCT patient regarding missed scan appts. Spoke with him. He states he forgot about his brain MRI on 08/18/20 and his CT scans on 08/19/20 Advised that both have been re-scheduled. Gave pt time and dates for both. He states he wrote them down and was able to repeat back to this RN those dates and time. Advised that the contrast for CT scans is at the front desk of the cancer center. He states he will pick up on Monday.

## 2020-08-21 ENCOUNTER — Other Ambulatory Visit: Payer: Self-pay

## 2020-08-21 ENCOUNTER — Ambulatory Visit (HOSPITAL_COMMUNITY)
Admission: RE | Admit: 2020-08-21 | Discharge: 2020-08-21 | Disposition: A | Discharge status: Home / Self Care | Payer: No Typology Code available for payment source | Source: Ambulatory Visit | Attending: Radiation Oncology | Admitting: Radiation Oncology

## 2020-08-21 DIAGNOSIS — C7931 Secondary malignant neoplasm of brain: Secondary | ICD-10-CM

## 2020-08-23 ENCOUNTER — Telehealth: Payer: Self-pay | Admitting: Internal Medicine

## 2020-08-23 NOTE — Telephone Encounter (Signed)
Rescheduled upcoming appointments due to provider's lunch. Patient is aware of changes.

## 2020-08-24 ENCOUNTER — Other Ambulatory Visit: Payer: Self-pay

## 2020-08-24 ENCOUNTER — Ambulatory Visit
Admission: RE | Admit: 2020-08-24 | Discharge: 2020-08-24 | Disposition: A | Discharge status: Home / Self Care | Payer: No Typology Code available for payment source | Source: Ambulatory Visit | Attending: Radiation Oncology | Admitting: Radiation Oncology

## 2020-08-24 ENCOUNTER — Ambulatory Visit (HOSPITAL_COMMUNITY)
Admission: RE | Admit: 2020-08-24 | Discharge: 2020-08-24 | Disposition: A | Discharge status: Home / Self Care | Payer: No Typology Code available for payment source | Source: Ambulatory Visit | Attending: Radiation Oncology | Admitting: Radiation Oncology

## 2020-08-24 DIAGNOSIS — C7949 Secondary malignant neoplasm of other parts of nervous system: Secondary | ICD-10-CM | POA: Diagnosis present

## 2020-08-24 DIAGNOSIS — C3411 Malignant neoplasm of upper lobe, right bronchus or lung: Secondary | ICD-10-CM

## 2020-08-24 DIAGNOSIS — C7931 Secondary malignant neoplasm of brain: Secondary | ICD-10-CM | POA: Diagnosis not present

## 2020-08-24 IMAGING — MR MR HEAD WO/W CM
11 of 14 series · 23 of 48 positions shown · IV contrast (gadavist)
Comparison: [DATE]

CLINICAL DATA: Assess treatment response of brain neoplasm.

EXAM:
MRI HEAD WITHOUT AND WITH CONTRAST
TECHNIQUE: Multiplanar, multiecho pulse sequences of the brain and surrounding
structures were obtained without and with intravenous contrast.
CONTRAST:  8.5mL GADAVIST GADOBUTROL 1 MMOL/ML IV SOLN

[Series 2: FLAIR · sagittal · 3.0mm · 0.47mm/px · 1 of 46 slices shown (1 of 2)]
[im 1/46]
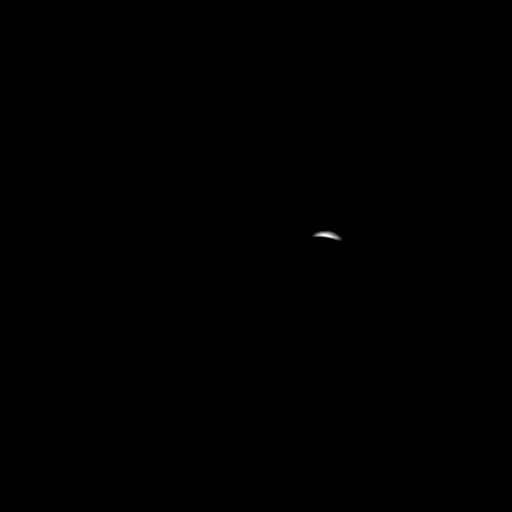

[Series 3: DWI · axial · 3.0mm · 0.94mm/px · z∈[-87,+123]mm · 3 of 142 slices shown]
[im 1/142]
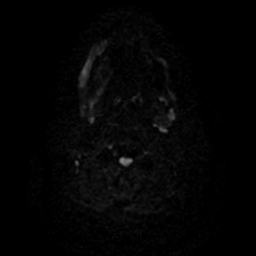
[im 71/142]
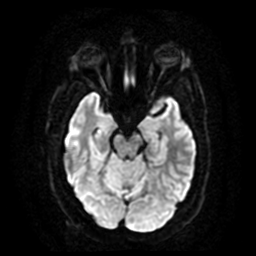
[im 142/142]
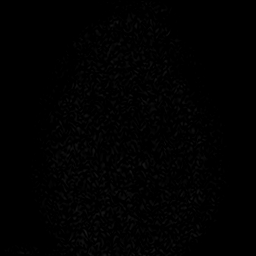

[Series 4: FLAIR · axial · 3.0mm · 0.47mm/px · z∈[-81,+120]mm · 2 of 68 slices shown (2 of 2)]
[im 1/68]
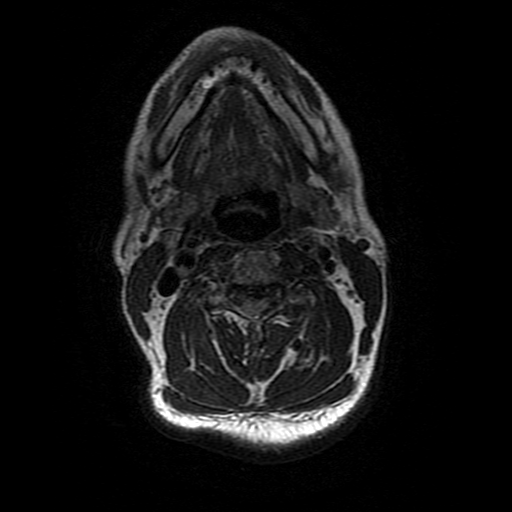
[im 68/68]
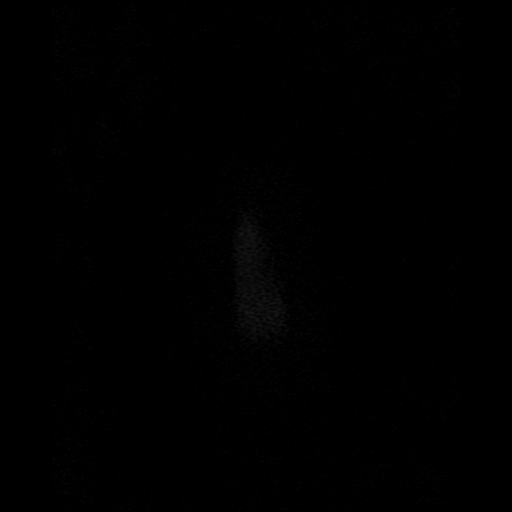

[Series 5: SWI · axial · 3.0mm · 0.47mm/px · z∈[-49,+123]mm · 3 of 116 slices shown (1 of 2)]
[im 1/116]
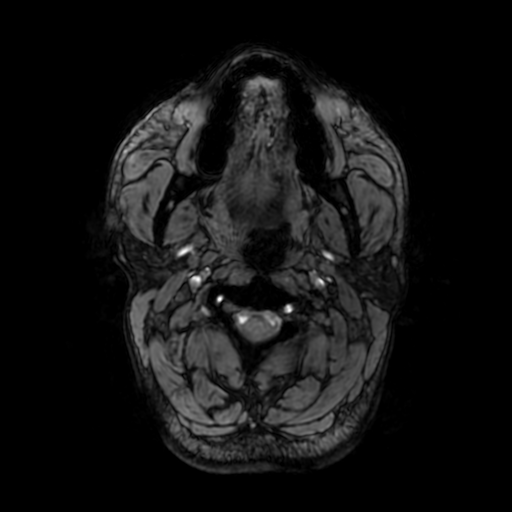
[im 58/116]
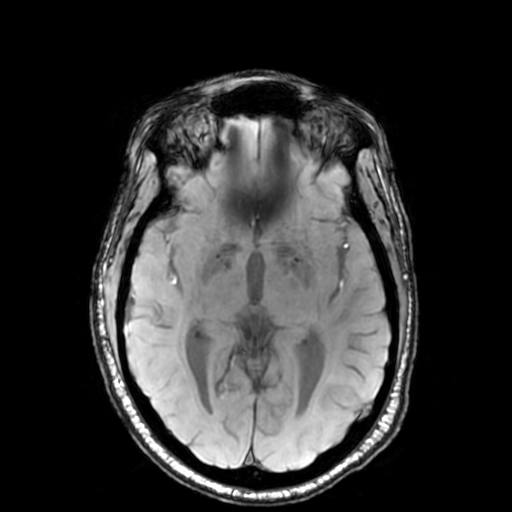
[im 116/116]
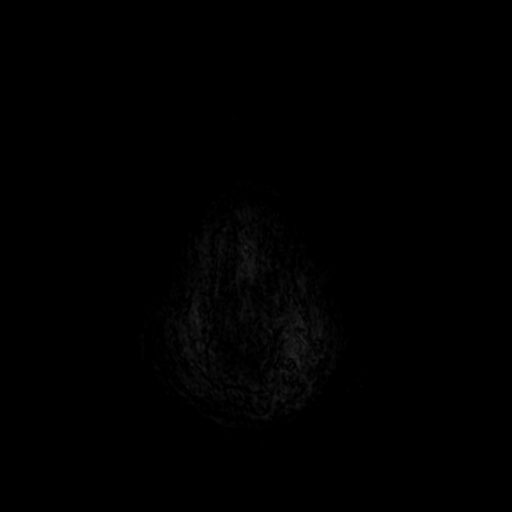

[Series 7: T2 post-contrast · coronal · 3.0mm · 0.39mm/px · 1 of 58 slices shown (1 of 2)]
[im 1/58]
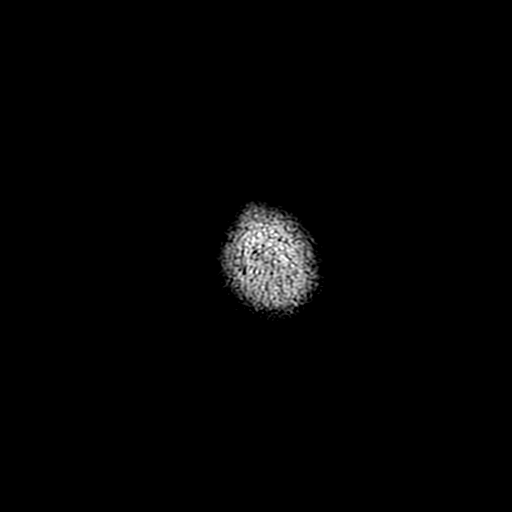

[Series 8: T2 post-contrast · axial · 5.0mm · 0.47mm/px · 1 of 32 slices shown (2 of 2)]
[im 1/32]
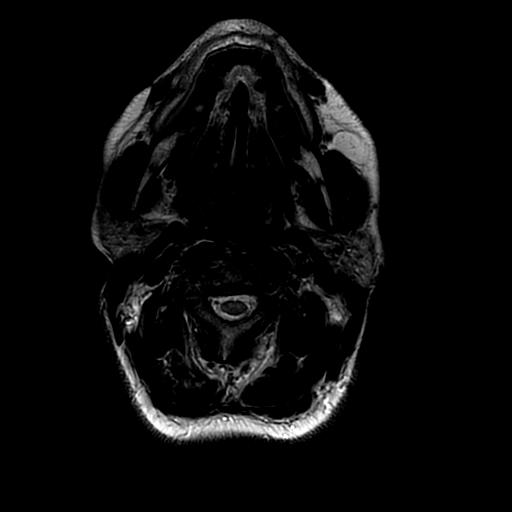

[Series 9: T1 post-contrast · coronal · 3.0mm · 0.39mm/px · 1 of 58 slices shown (1 of 2)]
[im 1/58]
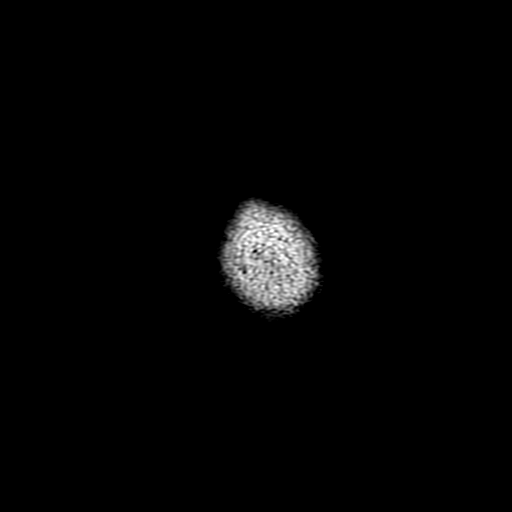

[Series 10: FLAIR post-contrast · sagittal · 3.0mm · 0.47mm/px · 1 of 46 slices shown]
[im 1/46]
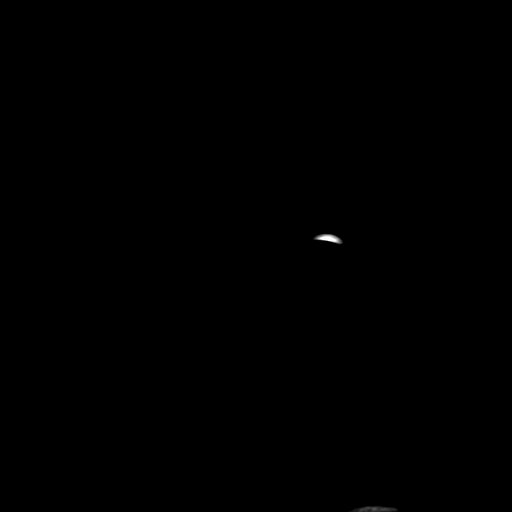

[Series 350: ADC · axial · 3.0mm · 0.94mm/px · z∈[-87,+123]mm · 2 of 71 slices shown]
[im 1/71]
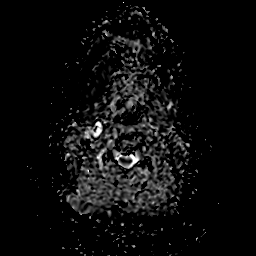
[im 71/71]
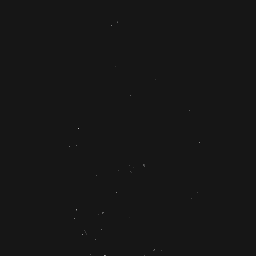

[Series 500: SWI · axial · 3.0mm · 0.47mm/px · 1 of 113 slices shown (2 of 2)]
[im 1/113]
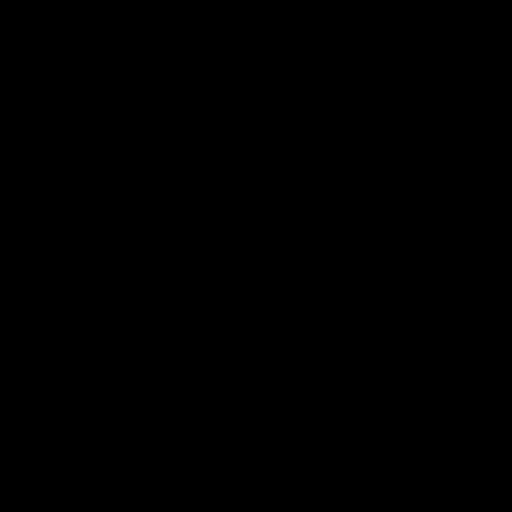

[Series 1100: T1 post-contrast · axial · 0.9mm · 0.50mm/px · z∈[-117,+138]mm · 7 of 292 slices shown (2 of 2)]
[im 1/292]
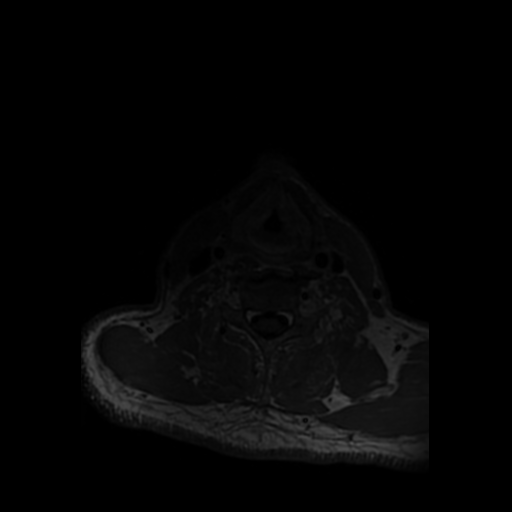
[im 49/292]
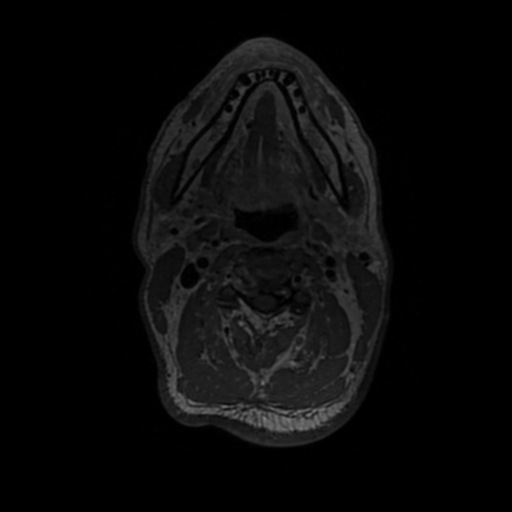
[im 98/292]
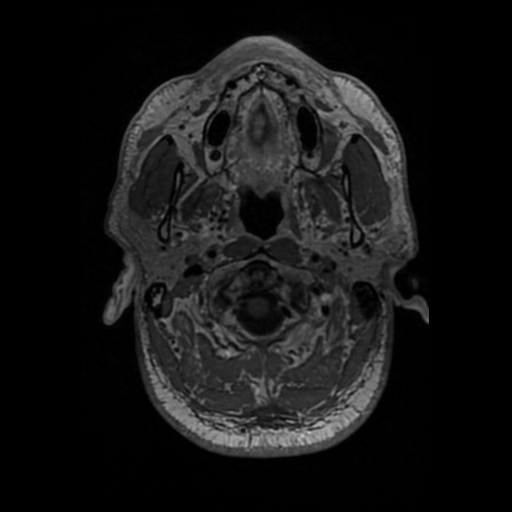
[im 146/292]
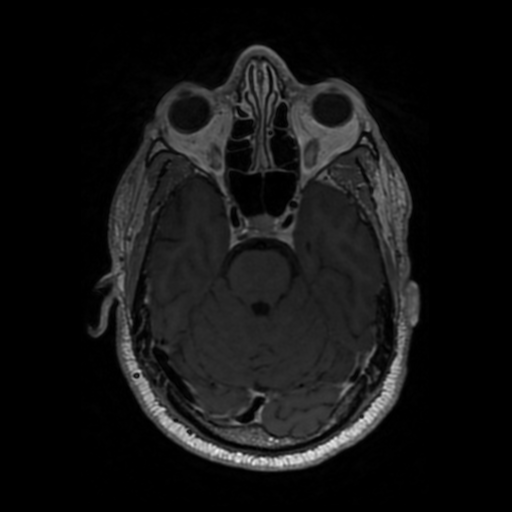
[im 195/292]
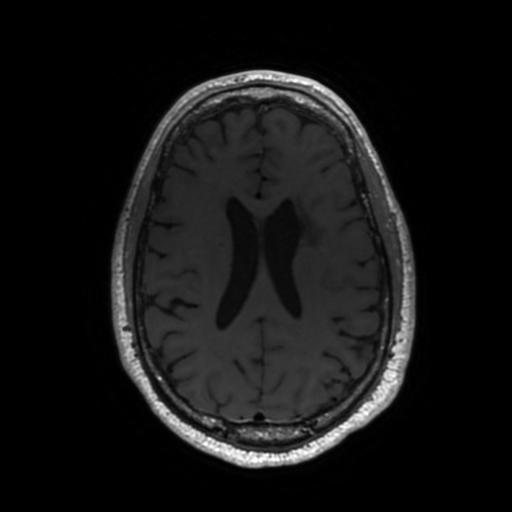
[im 243/292]
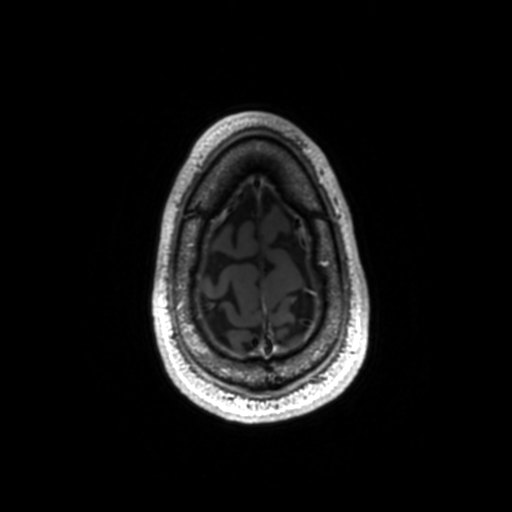
[im 292/292]
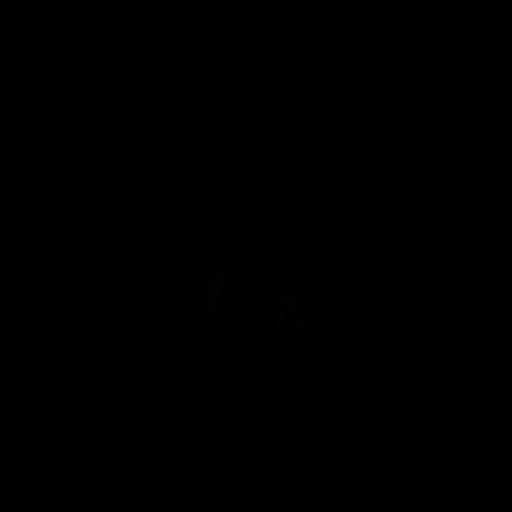

[23 of 48 positions shown; findings below may reference images not displayed]

FINDINGS: BRAIN

New Lesions: None.

Larger lesions: None.

Stable or Smaller lesions:

1. Right parietal parasagittal lesion marked on [DATE], decreased
in size and only subtly enhancing.
2. High left precentral gyrus lesion is no longer enhancing and only
shows small volume chronic blood products.

Other Brain findings: Remote perforator infarcts at the left corona
radiata and right caudothalamic groove. No acute hemorrhage,
hydrocephalus, or collection

Vascular: Preserved flow voids and vascular enhancements.

Skull and upper cervical spine: No focal bone lesion. C2-3 non
segmentation and subjacent disc degeneration.

Sinuses/Orbits: Negative
IMPRESSION: Positive treatment response at the 2 cerebral metastases. No new or
progressive disease.

## 2020-08-24 MED ORDER — GADOBUTROL 1 MMOL/ML IV SOLN
8.5000 mL | Freq: Once | INTRAVENOUS | Status: AC | PRN
  Administered 2020-08-24: 8.5 mL via INTRAVENOUS

## 2020-08-25 ENCOUNTER — Ambulatory Visit
Admission: RE | Admit: 2020-08-25 | Discharge: 2020-08-25 | Disposition: A | Discharge status: Home / Self Care | Payer: No Typology Code available for payment source | Source: Ambulatory Visit | Attending: Radiation Oncology | Admitting: Radiation Oncology

## 2020-08-25 DIAGNOSIS — C3411 Malignant neoplasm of upper lobe, right bronchus or lung: Secondary | ICD-10-CM | POA: Insufficient documentation

## 2020-08-25 DIAGNOSIS — C7931 Secondary malignant neoplasm of brain: Secondary | ICD-10-CM | POA: Insufficient documentation

## 2020-08-25 DIAGNOSIS — I2693 Single subsegmental pulmonary embolism without acute cor pulmonale: Secondary | ICD-10-CM | POA: Insufficient documentation

## 2020-08-25 NOTE — Progress Notes (Signed)
Radiation Oncology         [phone] ________________________________  Outpatient Follow Up - Conducted via telephone due to current COVID-19 concerns for limiting patient exposure  I spoke with the patient to conduct this consult visit via telephone to spare the patient unnecessary potential exposure in the healthcare setting during the current COVID-19 pandemic. The patient was notified in advance and was offered a [facility] meeting to allow for face to face communication but unfortunately reported that they did not have the appropriate resources/technology to support such a visit and instead preferred to proceed with a telephone visit.    Name: Randy Andersen        MRN: [id number]  Date of Service: 08/26/2020 DOB: [birth year]  CC:  Randy Mody, MD     REFERRING PHYSICIAN: Meredith Mody, MD   DIAGNOSIS: The primary encounter diagnosis was Malignant neoplasm of upper lobe of right lung ([place]). A diagnosis of Brain metastases [facility]) was also pertinent to this visit.   HISTORY OF PRESENT ILLNESS: Randy Andersen is a 65 y.o. 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 He received stereotactic radiosurgery to two targets in August 2021. He continues his treatment with chemo/Immunotherapy with Dr. Julien Nordmann and is due for restaging scans next week and for another infusion. He had repeat MRI of the brain yesterday which showed improvement in the right parietal lesion with minimal enhancement, and resolution of the lesion in the left parietal lobe with only evidence of blood products from prior treatment. He's contacted by phone to discuss these results.    PREVIOUS RADIATION THERAPY:  5 weeks   03/18/20 SRS Treatment:   20 Gy in 1 fraction to 2 targets: PTV1 Rt Parietal 6 mm PTV2 Lt Parietal 6 mm   PAST MEDICAL HISTORY:  Past Medical History:  Diagnosis Date  . Asthma    as a child  . Chronic pain disorder    LT leg and foot  . Heart murmur     as a child  . Hypertension   . Malignant neoplasm of upper lobe of right lung ([place])   . Stroke [facility])    mini stroke - 2015, some tingling in fingers on right        PAST SURGICAL HISTORY: Past Surgical History:  Procedure Laterality Date  . BUNIONECTOMY Left    had hammer toe surgery also and callus removed  . BUNIONECTOMY Right    also had hammer toe surgery and callus removed  . LEG SURGERY Left    PT reports he has pins placed in his Lt leg  . ORIF TIBIA PLATEAU  10/09/2012   Dr Lorin Mercy  . ORIF TIBIA PLATEAU Left 10/08/2012   Procedure: OPEN REDUCTION INTERNAL FIXATION (ORIF) TIBIAL PLATEAU;  Surgeon: Marybelle Killings, MD;  Location: [place];  Service: Orthopedics;  Laterality: Left;  Marland Kitchen VIDEO BRONCHOSCOPY WITH ENDOBRONCHIAL NAVIGATION N/A 02/25/2020   Procedure: VIDEO BRONCHOSCOPY WITH ENDOBRONCHIAL NAVIGATION with biopsies;  Surgeon: Collene Gobble, MD;  Location[facility];  Service: Thoracic;  Laterality: N/A;  . VIDEO BRONCHOSCOPY WITH ENDOBRONCHIAL ULTRASOUND N/A 02/25/2020   Procedure: VIDEO BRONCHOSCOPY WITH ENDOBRONCHIAL ULTRASOUND;  Surgeon: Collene Gobble, MD;  [facility];  Service: Thoracic;  Laterality: N/A;     FAMILY HISTORY:  Family History  Problem Relation Age of Onset  . Cancer Mother        Intestinal metastatic to ovaries     SOCIAL HISTORY:  reports that he  has been smoking cigarettes. He has a 42.00 pack-year smoking history. He has never used smokeless tobacco. He reports that he does not drink alcohol and does not use drugs. The patient is single and lives in [place].    ALLERGIES: Patient has no known allergies.   MEDICATIONS:  Current Outpatient Medications  Medication Sig Dispense Refill  . amLODipine (NORVASC) 10 MG tablet Take 10 mg by mouth daily.    Marland Kitchen doxylamine, Sleep, (UNISOM) 25 MG tablet Take 25 mg by mouth at bedtime as needed for sleep.    . folic acid (FOLVITE) 1 MG tablet Take 1 tablet (1 mg total) by mouth daily. 30 tablet 2  .  HYDROcodone-acetaminophen (NORCO) 10-325 MG tablet Take 1 tablet by mouth 2 (two) times daily.    . phenazopyridine (PYRIDIUM) 200 MG tablet Take 1 tablet (200 mg total) by mouth 3 (three) times daily. 6 tablet 0  . prochlorperazine (COMPAZINE) 10 MG tablet Take 1 tablet (10 mg total) by mouth every 6 (six) hours as needed. 30 tablet 2  . rivaroxaban (XARELTO) 20 MG TABS tablet Take 1 tablet (20 mg total) by mouth daily with supper. 30 tablet 2  . tamsulosin (FLOMAX) 0.4 MG CAPS capsule Take 1 capsule (0.4 mg total) by mouth daily. 30 capsule 2  . temazepam (RESTORIL) 15 MG capsule Take 1 capsule (15 mg total) by mouth at bedtime as needed for sleep. Pt is veteran - Call pt to set up delivery 30 capsule 0   No current facility-administered medications for this encounter.     REVIEW OF SYSTEMS: On review of systems, the patient reports that he is doing well overall. He denies any concerns with headaches, visual or auditory changes, nausea or vomiting. No other complaints are verbalized.  PHYSICAL EXAM:  Wt Readings from Last 3 Encounters:  08/10/20 186 lb 8 oz (84.6 kg)  07/21/20 183 lb 9.6 oz (83.3 kg)  06/30/20 187 lb 9.6 oz (85.1 kg)   Unable to assess due to encounter type.  ECOG = 0  0 - Asymptomatic (Fully active, able to carry on all predisease activities without restriction)  1 - Symptomatic but completely ambulatory (Restricted in physically strenuous activity but ambulatory and able to carry out work of a light or sedentary nature. For example, light housework, office work)  2 - Symptomatic, <50% in bed during the day (Ambulatory and capable of all self care but unable to carry out any work activities. Up and about more than 50% of waking hours)  3 - Symptomatic, >50% in bed, but not bedbound (Capable of only limited self-care, confined to bed or chair 50% or more of waking hours)  4 - Bedbound (Completely disabled. Cannot carry on any self-care. Totally confined to bed or  chair)  5 - Death   Eustace Pen MM, Creech RH, Tormey DC, et al. [phone]). "Toxicity and response criteria of the [facility]". [place] Oncol. 5 (6): 649-55    LABORATORY DATA:  Lab Results  Component Value Date   WBC 5.2 08/10/2020   HGB 11.9 (L) 08/10/2020   HCT 35.3 (L) 08/10/2020   MCV 94.6 08/10/2020   PLT 149 (L) 08/10/2020   Lab Results  Component Value Date   NA 139 08/10/2020   K 3.7 08/10/2020   CL 107 08/10/2020   CO2 22 08/10/2020   Lab Results  Component Value Date   ALT 21 08/10/2020   AST 28 08/10/2020   ALKPHOS 88 08/10/2020  BILITOT 0.2 (L) 08/10/2020      RADIOGRAPHY: MR Brain W Wo Contrast  Result Date: 08/25/2020 CLINICAL DATA:  Assess treatment response of brain neoplasm. EXAM: MRI HEAD WITHOUT AND WITH CONTRAST TECHNIQUE: Multiplanar, multiecho pulse sequences of the brain and surrounding structures were obtained without and with intravenous contrast. CONTRAST:  8.66mL GADAVIST GADOBUTROL 1 MMOL/ML IV SOLN COMPARISON:  03/13/2020 FINDINGS: BRAIN New Lesions: None. Larger lesions: None. Stable or Smaller lesions: 1. Right parietal parasagittal lesion marked on 1100:230, decreased in size and only subtly enhancing. 2. High left precentral gyrus lesion is no longer enhancing and only shows small volume chronic blood products. Other Brain findings: Remote perforator infarcts at the left corona radiata and right caudothalamic groove. No acute hemorrhage, hydrocephalus, or collection Vascular: Preserved flow voids and vascular enhancements. Skull and upper cervical spine: No focal bone lesion. C2-3 non segmentation and subjacent disc degeneration. Sinuses/Orbits: Negative IMPRESSION: Positive treatment response at the 2 cerebral metastases. No new or progressive disease. Electronically Signed   By: Monte Fantasia M.D.   On: 08/25/2020 05:02       IMPRESSION/PLAN: 1. 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  The patient is doing well clinically and will follow up with [place] in 3 months for repeat MRI of the brain. He is encouraged to call if he develops questions or concerns prior to that visit. He will also continue his treatment and visits with Dr. Julien Nordmann, and we will follow this aspect of his course expectantly.   Given current concerns for patient exposure during the COVID-19 pandemic, this encounter was conducted via telephone.  The patient has provided two factor identification and has given verbal consent for this type of encounter and has been advised to only accept a meeting of this type in a secure network environment. The time spent during this encounter was 30 minutes including preparation, discussion, and coordination of the patient's care. The attendants for this meeting include Hayden Pedro and Clarene Critchley.  During the encounter, Hayden Pedro was located at [facility] Radiation Oncology Department.  Shalin Vonbargen was located at home.       Carola Rhine, PAC

## 2020-08-26 ENCOUNTER — Other Ambulatory Visit: Payer: Self-pay

## 2020-08-26 ENCOUNTER — Ambulatory Visit
Admission: RE | Admit: 2020-08-26 | Payer: No Typology Code available for payment source | Source: Ambulatory Visit | Admitting: Radiation Oncology

## 2020-08-26 DIAGNOSIS — C3411 Malignant neoplasm of upper lobe, right bronchus or lung: Secondary | ICD-10-CM

## 2020-08-26 MED ORDER — PROCHLORPERAZINE MALEATE 10 MG PO TABS: 10.0000 mg | ORAL_TABLET | Freq: Four times a day (QID) | ORAL | 2 refills | Status: DC | PRN

## 2020-08-30 ENCOUNTER — Telehealth: Payer: Self-pay | Admitting: Medical Oncology

## 2020-08-30 NOTE — Telephone Encounter (Signed)
Anticoag recommendation form to hold blood thinner for two days received by [facility] and signed by [facility] and faxed back to [place].

## 2020-08-31 ENCOUNTER — Encounter (HOSPITAL_COMMUNITY): Payer: Self-pay

## 2020-08-31 ENCOUNTER — Other Ambulatory Visit: Payer: Self-pay

## 2020-08-31 ENCOUNTER — Other Ambulatory Visit (HOSPITAL_COMMUNITY): Payer: No Typology Code available for payment source

## 2020-08-31 ENCOUNTER — Ambulatory Visit (HOSPITAL_COMMUNITY)
Admission: RE | Admit: 2020-08-31 | Discharge: 2020-08-31 | Disposition: A | Discharge status: Home / Self Care | Payer: No Typology Code available for payment source | Source: Ambulatory Visit | Attending: Internal Medicine | Admitting: Internal Medicine

## 2020-08-31 DIAGNOSIS — C3490 Malignant neoplasm of unspecified part of unspecified bronchus or lung: Secondary | ICD-10-CM

## 2020-08-31 IMAGING — CT CT CHEST W/ CM
2 of 5 series · 12 of 36 positions shown, 15 images · IV contrast (OMNIPAQUE)
Comparison: Most recent CT chest, abdomen and pelvis [DATE].

CLINICAL DATA: Primary Cancer Type: Non-small cell lung
TECHNIQUE: Multidetector CT imaging of the chest, abdomen and pelvis was
performed following the standard protocol during bolus
administration of intravenous contrast.

CONTRAST:  100mL OMNIPAQUE IOHEXOL 300 MG/ML  SOLN

[Series 2: cap with · axial · 0.81mm/px · z∈[+1154,+1674]mm · 9 of 130 slices shown, 12 images]
[im 13/130  mediastinal]
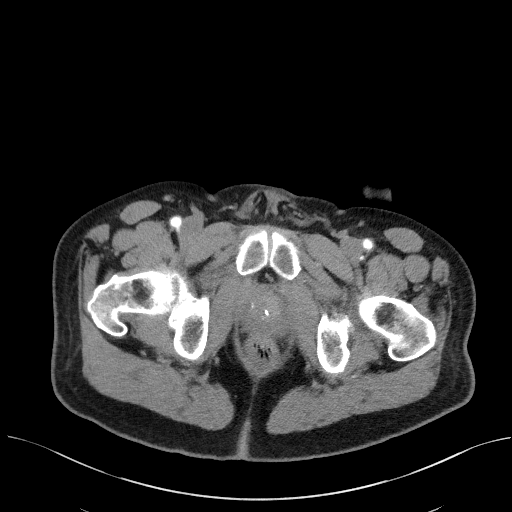
[im 13/130  lung]
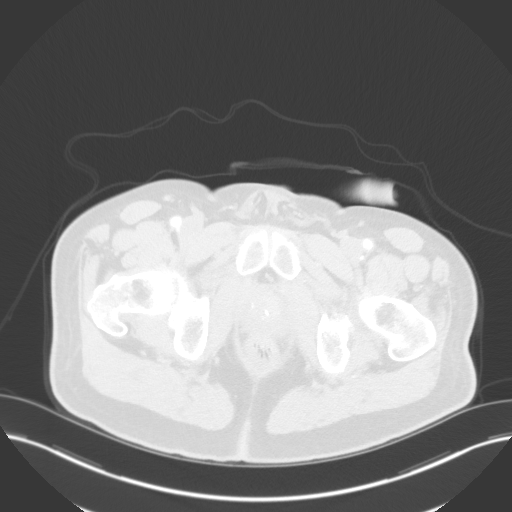
[im 26/130  lung]
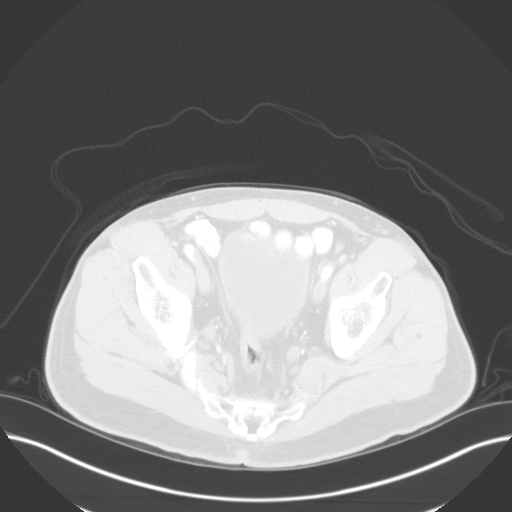
[im 39/130  lung]
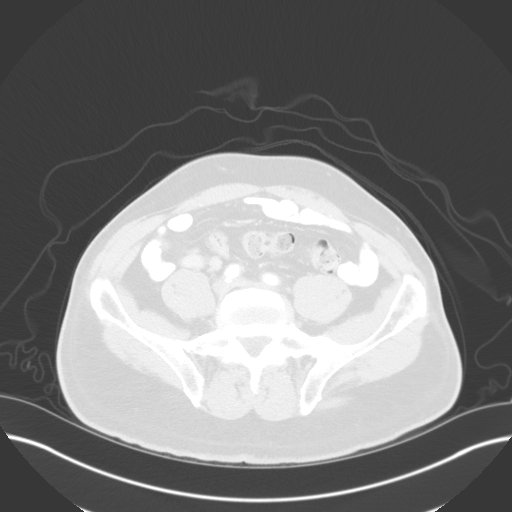
[im 52/130  lung]
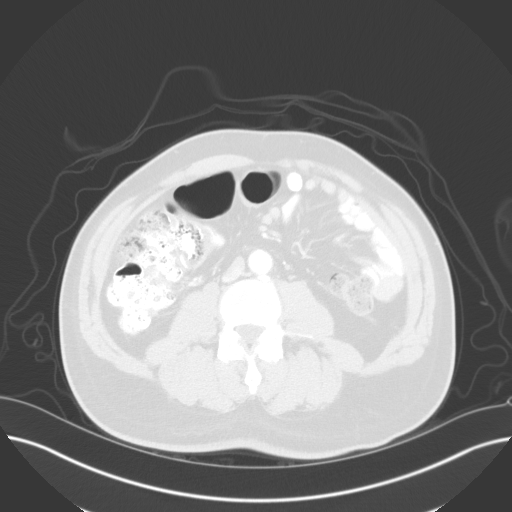
[im 65/130  mediastinal]
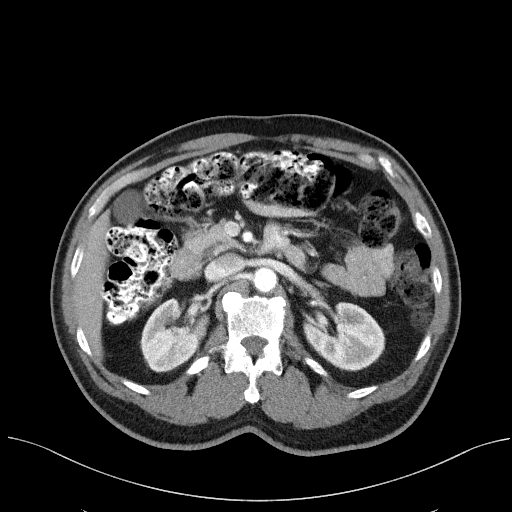
[im 65/130  lung]
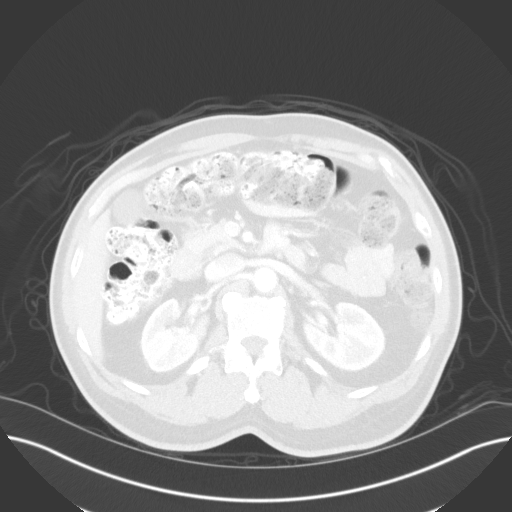
[im 78/130  lung]
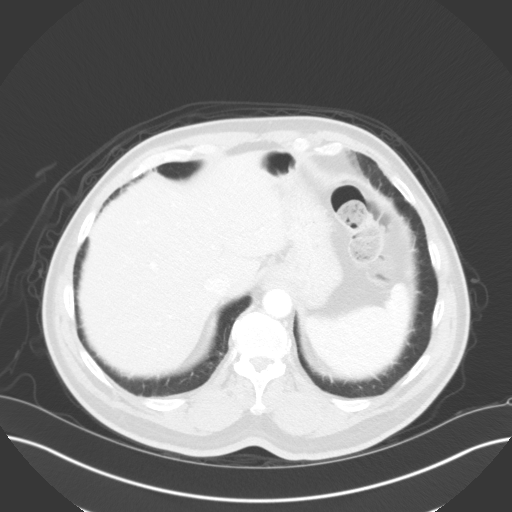
[im 91/130  lung]
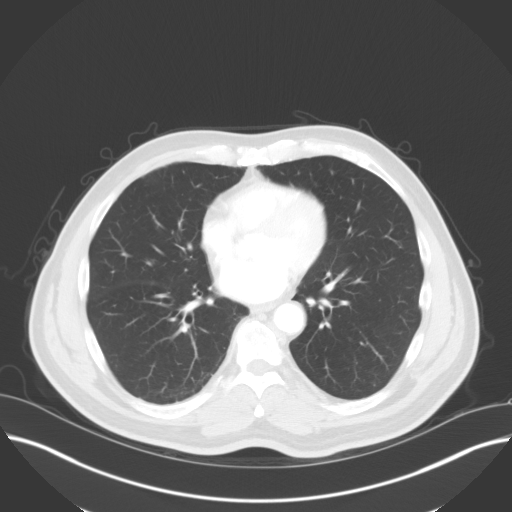
[im 104/130  lung]
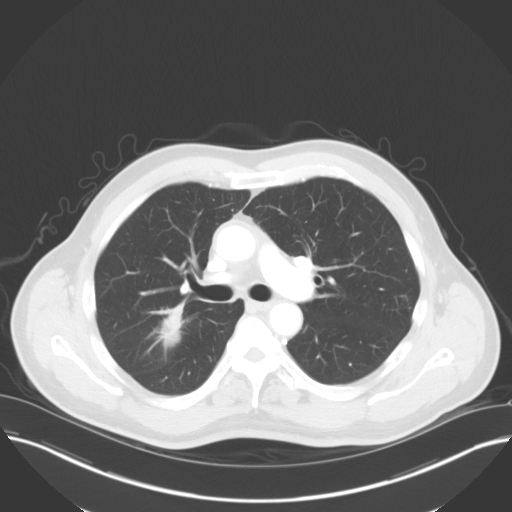
[im 117/130  mediastinal]
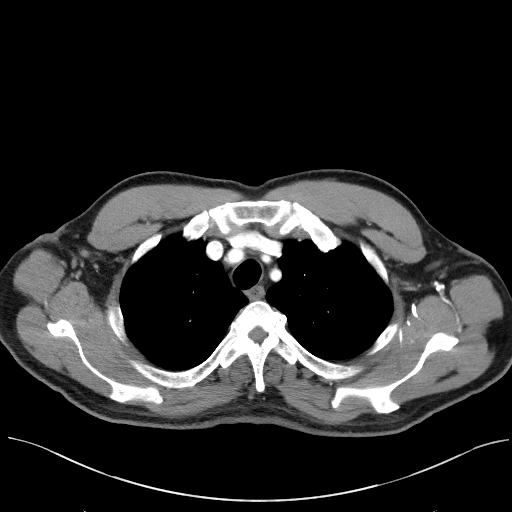
[im 117/130  lung]
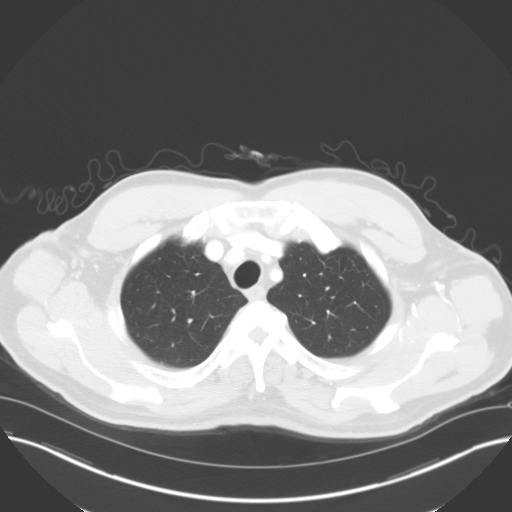

[Series 4: coronals · coronal · 1.04mm/px · 3 of 130 slices shown]
[im 26/130  lung]
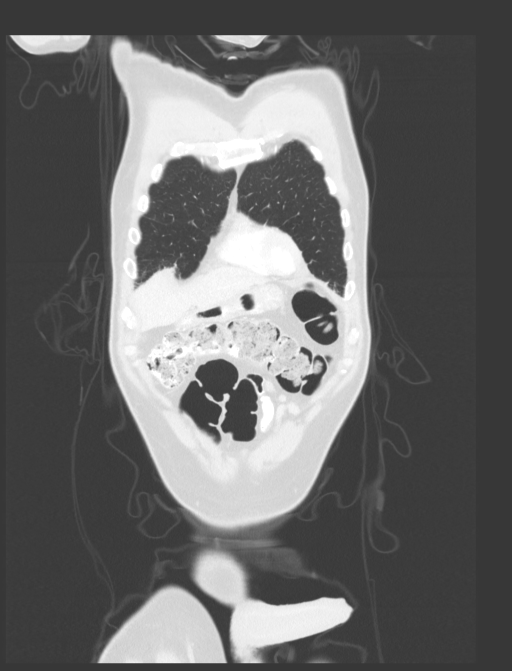
[im 52/130  lung]
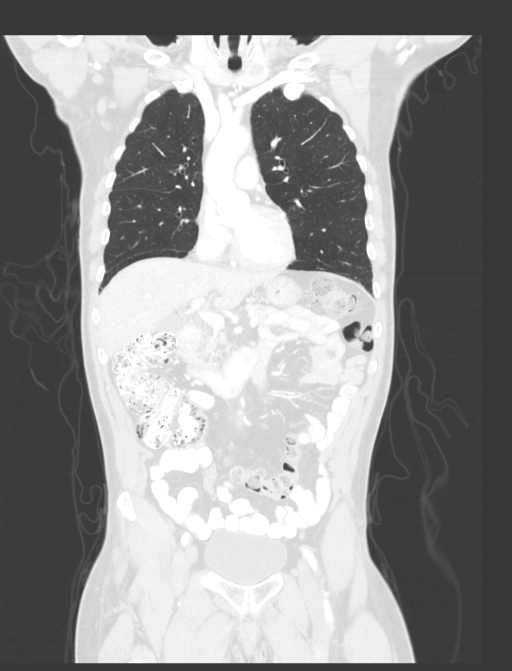
[im 78/130  lung]
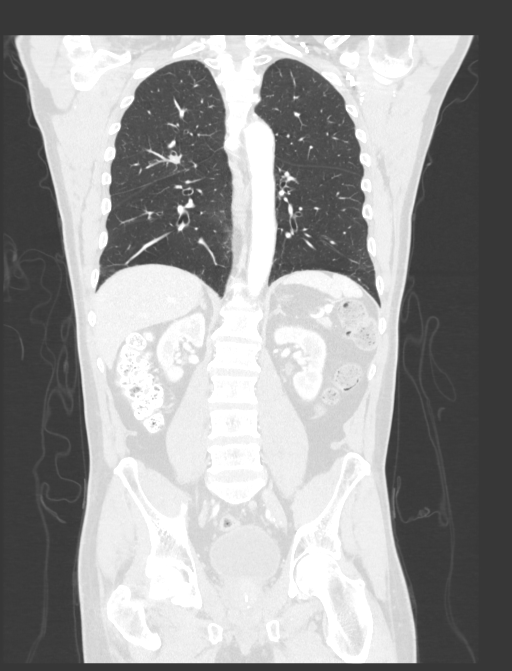

[12 of 36 positions shown; findings below may reference images not displayed]

Imaging Indication: Assess response to therapy

Interval therapy since last imaging? Yes

Initial Cancer Diagnosis

Date: [DATE]; Established by: Biopsy-proven

Detailed Pathology: Stage IV non-small cell lung cancer,
adenocarcinoma.

Primary Tumor location:  Right upper lobe. Metastases to brain.

Surgeries: No.

Chemotherapy: Yes; Ongoing? Yes; Most recent administration:
[DATE]

Immunotherapy?  Yes; Type: Keytruda; Ongoing? Yes

Radiation therapy?  Yes; Date Range: [DATE]; Target: Brain

Other Cancers: Thyroid cancer.

EXAM:
CT CHEST, ABDOMEN, AND PELVIS WITH CONTRAST
FINDINGS: Cardiovascular: Atherosclerosis of the aorta, great vessels and
coronary arteries. No acute vascular findings. The heart size is
normal. There is no pericardial effusion.

Mediastinum/Nodes: There are no enlarged mediastinal, hilar or
axillary lymph nodes. Stable hypodense 1.4 cm low-density left
thyroid nodule on image [DATE], previously evaluated by ultrasound. The
trachea and esophagus appear normal.

Lungs/Pleura: No pleural effusion or pneumothorax. The spiculated
right upper lobe lesion appears slightly smaller, measuring 3.4 x
2.2 cm on image 61/6 (previously 3.6 x 2.4 cm). Sub solid left upper
lobe nodule measuring 6 mm on image 49/6 is stable. No new or
enlarging pulmonary nodules identified.

Musculoskeletal/Chest wall: No chest wall mass or suspicious osseous
findings.

CT ABDOMEN AND PELVIS FINDINGS

Hepatobiliary: The liver is normal in density without suspicious
focal abnormality. No evidence of gallstones, gallbladder wall
thickening or biliary dilatation.

Pancreas: Unremarkable. No pancreatic ductal dilatation or
surrounding inflammatory changes.

Spleen: Normal in size without focal abnormality.

Adrenals/Urinary Tract: Both adrenal glands appear normal. The
kidneys appear normal without evidence of urinary tract calculus,
suspicious lesion or hydronephrosis. No bladder abnormalities are
seen.

Stomach/Bowel: The stomach appears unremarkable for its degree of
distension. No evidence of bowel wall thickening, distention or
surrounding inflammatory change. The appendix appears normal.
Enteric contrast was administered. There is moderate stool
throughout the colon.

Vascular/Lymphatic: There are no enlarged abdominal or pelvic lymph
nodes. Aortic and branch vessel atherosclerosis without acute
vascular findings. The portal, superior mesenteric and splenic veins
appear normal.

Reproductive: Stable mild prostatomegaly.

Other: No evidence of abdominal wall mass or hernia. No ascites.

Musculoskeletal: No acute or significant osseous findings.
IMPRESSION: 1. Slight interval decrease in size of spiculated right upper lobe
lesion.
2. No evidence of metastatic disease.
3. Stable sub solid left upper lobe nodule; attention on follow-up.
4. Aortic Atherosclerosis ([26]-[26]).

## 2020-08-31 MED ORDER — IOHEXOL 300 MG/ML  SOLN
100.0000 mL | Freq: Once | INTRAMUSCULAR | Status: AC | PRN
  Administered 2020-08-31: 100 mL via INTRAVENOUS

## 2020-09-01 ENCOUNTER — Ambulatory Visit: Payer: No Typology Code available for payment source

## 2020-09-01 ENCOUNTER — Other Ambulatory Visit: Payer: Self-pay

## 2020-09-01 ENCOUNTER — Inpatient Hospital Stay: Payer: No Typology Code available for payment source | Attending: Internal Medicine

## 2020-09-01 ENCOUNTER — Ambulatory Visit: Payer: No Typology Code available for payment source | Admitting: Internal Medicine

## 2020-09-01 ENCOUNTER — Inpatient Hospital Stay (HOSPITAL_BASED_OUTPATIENT_CLINIC_OR_DEPARTMENT_OTHER): Payer: No Typology Code available for payment source | Admitting: Internal Medicine

## 2020-09-01 ENCOUNTER — Inpatient Hospital Stay: Payer: No Typology Code available for payment source

## 2020-09-01 ENCOUNTER — Other Ambulatory Visit: Payer: No Typology Code available for payment source

## 2020-09-01 VITALS — BP 135/84 | HR 89 | Temp 97.8°F | Resp 18 | Ht 74.0 in | Wt 187.4 lb

## 2020-09-01 DIAGNOSIS — C7931 Secondary malignant neoplasm of brain: Secondary | ICD-10-CM | POA: Insufficient documentation

## 2020-09-01 DIAGNOSIS — C73 Malignant neoplasm of thyroid gland: Secondary | ICD-10-CM | POA: Insufficient documentation

## 2020-09-01 DIAGNOSIS — Z5111 Encounter for antineoplastic chemotherapy: Secondary | ICD-10-CM | POA: Diagnosis not present

## 2020-09-01 DIAGNOSIS — Z5112 Encounter for antineoplastic immunotherapy: Secondary | ICD-10-CM | POA: Insufficient documentation

## 2020-09-01 DIAGNOSIS — C3411 Malignant neoplasm of upper lobe, right bronchus or lung: Secondary | ICD-10-CM | POA: Diagnosis not present

## 2020-09-01 DIAGNOSIS — Z79899 Other long term (current) drug therapy: Secondary | ICD-10-CM | POA: Diagnosis not present

## 2020-09-01 LAB — CMP (CANCER CENTER ONLY)
ALT: 23 U/L (ref 0–44)
AST: 22 U/L (ref 15–41)
Albumin: 3.8 g/dL (ref 3.5–5.0)
Alkaline Phosphatase: 110 U/L (ref 38–126)
Anion gap: 9 (ref 5–15)
BUN: 15 mg/dL (ref 8–23)
CO2: 23 mmol/L (ref 22–32)
Calcium: 9.4 mg/dL (ref 8.9–10.3)
Chloride: 109 mmol/L (ref 98–111)
Creatinine: 1.52 mg/dL — ABNORMAL HIGH (ref 0.61–1.24)
GFR, Estimated: 51 mL/min — ABNORMAL LOW (ref >60)
Glucose, Bld: 104 mg/dL — ABNORMAL HIGH (ref 70–99)
Potassium: 3.9 mmol/L (ref 3.5–5.1)
Sodium: 141 mmol/L (ref 135–145)
Total Bilirubin: 0.3 mg/dL (ref 0.3–1.2)
Total Protein: 7.9 g/dL (ref 6.5–8.1)

## 2020-09-01 LAB — TSH: TSH: 2.428 u[IU]/mL (ref 0.320–4.118)

## 2020-09-01 LAB — CBC WITH DIFFERENTIAL (CANCER CENTER ONLY)
Abs Immature Granulocytes: 0.01 10*3/uL (ref 0.00–0.07)
Basophils Absolute: 0.1 10*3/uL (ref 0.0–0.1)
Basophils Relative: 1 %
Eosinophils Absolute: 0.1 10*3/uL (ref 0.0–0.5)
Eosinophils Relative: 2 %
HCT: 35.8 % — ABNORMAL LOW (ref 39.0–52.0)
Hemoglobin: 12.1 g/dL — ABNORMAL LOW (ref 13.0–17.0)
Immature Granulocytes: 0 %
Lymphocytes Relative: 33 %
Lymphs Abs: 1.6 10*3/uL (ref 0.7–4.0)
MCH: 32.1 pg (ref 26.0–34.0)
MCHC: 33.8 g/dL (ref 30.0–36.0)
MCV: 95 fL (ref 80.0–100.0)
Monocytes Absolute: 0.7 10*3/uL (ref 0.1–1.0)
Monocytes Relative: 14 %
Neutro Abs: 2.4 10*3/uL (ref 1.7–7.7)
Neutrophils Relative %: 50 %
Platelet Count: 273 10*3/uL (ref 150–400)
RBC: 3.77 MIL/uL — ABNORMAL LOW (ref 4.22–5.81)
RDW: 14.6 % (ref 11.5–15.5)
WBC Count: 4.8 10*3/uL (ref 4.0–10.5)
nRBC: 0 % (ref 0.0–0.2)

## 2020-09-01 MED ORDER — PEMBROLIZUMAB CHEMO INJECTION 100 MG/4ML
200.0000 mg | Freq: Once | INTRAVENOUS | Status: AC
  Administered 2020-09-01: 200 mg via INTRAVENOUS
  Filled 2020-09-01: qty 8

## 2020-09-01 MED ORDER — SODIUM CHLORIDE 0.9 % IV SOLN
400.0000 mg/m2 | Freq: Once | INTRAVENOUS | Status: AC
  Administered 2020-09-01: 800 mg via INTRAVENOUS
  Filled 2020-09-01: qty 20

## 2020-09-01 MED ORDER — PROCHLORPERAZINE MALEATE 10 MG PO TABS
10.0000 mg | ORAL_TABLET | Freq: Once | ORAL | Status: AC
  Administered 2020-09-01: 10 mg via ORAL

## 2020-09-01 MED ORDER — CYANOCOBALAMIN 1000 MCG/ML IJ SOLN
INTRAMUSCULAR | Status: AC
  Filled 2020-09-01: qty 1

## 2020-09-01 MED ORDER — PROCHLORPERAZINE MALEATE 10 MG PO TABS
ORAL_TABLET | ORAL | Status: AC
  Filled 2020-09-01: qty 1

## 2020-09-01 MED ORDER — HEPARIN SOD (PORK) LOCK FLUSH 100 UNIT/ML IV SOLN
500.0000 [IU] | Freq: Once | INTRAVENOUS | Status: DC | PRN
  Filled 2020-09-01: qty 5

## 2020-09-01 MED ORDER — LIDOCAINE HCL 2 % IJ SOLN
INTRAMUSCULAR | Status: AC
  Filled 2020-09-01: qty 20

## 2020-09-01 MED ORDER — SODIUM CHLORIDE 0.9 % IV SOLN
Freq: Once | INTRAVENOUS | Status: AC
  Filled 2020-09-01: qty 250

## 2020-09-01 MED ORDER — SODIUM CHLORIDE 0.9% FLUSH
10.0000 mL | INTRAVENOUS | Status: DC | PRN
  Filled 2020-09-01: qty 10

## 2020-09-01 MED ORDER — CYANOCOBALAMIN 1000 MCG/ML IJ SOLN: 1000.0000 ug | Freq: Once | INTRAMUSCULAR | Status: DC

## 2020-09-01 NOTE — Progress Notes (Signed)
Per Dr Julien Nordmann it is okay to treat pt  today with Keytruda and Alimta and creatinine of 1.52.

## 2020-09-01 NOTE — Progress Notes (Signed)
[facility] Telephone:([phone]) [phone]   Fax:(336) [facility] [address] [place] [address]  DIAGNOSIS: Stage IV non-small cell lung cancer, favored to be adenocarcinoma. He presented with posterior right upper lobe nodule as well as right hilar, infrahilar, subcarinal, and right paratracheal lymphadenopathy. He also has a 1.4 cm x 1 cm soft tissue nodule in the skin/subcutaneous fat in the left axilla. 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 He was diagnosed in August 2021  Molecular Biomarkers: BIOMARKER(S)         % CFDNA OR AMPLIFICATION       ASSOCIATED FDA-APPROVED THERAPIES        CLINICAL TRIAL AVAILABILITY [id number] 2.9% None    Yes [id number] 0.4% None    Yes [id number] 0.2% None    Yes  PRIOR THERAPY:  1) SRS to the small metastatic brain lesion under the care of Dr. [name] on 03/18/20  CURRENT THERAPY: Palliative systemic chemotherapy with carboplatin for an AUC of 5, Alimta 500 mg/m2, and Keytruda 200 mg IV every 3 weeks. First dose expected on 04/07/20.  Status post 7 cycles.  Starting from cycle #5 he will be on maintenance treatment with Alimta and Keytruda every 3 weeks  INTERVAL HISTORY: Randy Andersen 65 y.o. male returns to the clinic today for follow-up visit.  The patient is feeling fine today with no concerning complaints.  He denied having any current chest pain, shortness of breath, cough or hemoptysis.  He denied having any fever or chills.  He has no nausea, vomiting, diarrhea or constipation.  He has no significant weight loss or night sweats.  He denied having any headache or visual changes.  He continues to tolerate his maintenance treatment with Alimta and Keytruda fairly well.  The patient had repeat CT scan of the chest, abdomen pelvis performed recently and is here for evaluation and discussion of his discuss results.   MEDICAL  HISTORY: Past Medical History:  Diagnosis Date  . Asthma    as a child  . Chronic pain disorder    LT leg and foot  . Heart murmur    as a child  . Hypertension   . Malignant neoplasm of upper lobe of right lung ([facility])   . Stroke [facility])    mini stroke - 2015, some tingling in fingers on right     ALLERGIES:  has No Known Allergies.  MEDICATIONS:  Current Outpatient Medications  Medication Sig Dispense Refill  . amLODipine (NORVASC) 10 MG tablet Take 10 mg by mouth daily.    Marland Kitchen HYDROcodone-acetaminophen (NORCO) 10-325 MG tablet Take 1 tablet by mouth 2 (two) times daily.    . rivaroxaban (XARELTO) 20 MG TABS tablet Take 1 tablet (20 mg total) by mouth daily with supper. 30 tablet 2  . tamsulosin (FLOMAX) 0.4 MG CAPS capsule Take 1 capsule (0.4 mg total) by mouth daily. 30 capsule 2  . doxylamine, Sleep, (UNISOM) 25 MG tablet Take 25 mg by mouth at bedtime as needed for sleep. (Patient not taking: Reported on 09/01/2020)    . folic acid (FOLVITE) 1 MG tablet Take 1 tablet (1 mg total) by mouth daily. (Patient not taking: Reported on 09/01/2020) 30 tablet 2  . phenazopyridine (PYRIDIUM) 200 MG tablet Take 1 tablet (200 mg total) by mouth 3 (three) times daily. (Patient not taking: Reported on 09/01/2020) 6 tablet 0  . prochlorperazine (COMPAZINE) 10  MG tablet Take 1 tablet (10 mg total) by mouth every 6 (six) hours as needed. (Patient not taking: Reported on 09/01/2020) 30 tablet 2  . temazepam (RESTORIL) 15 MG capsule Take 1 capsule (15 mg total) by mouth at bedtime as needed for sleep. Pt is veteran - Call pt to set up delivery (Patient not taking: Reported on 09/01/2020) 30 capsule 0   No current facility-administered medications for this visit.    SURGICAL HISTORY:  Past Surgical History:  Procedure Laterality Date  . BUNIONECTOMY Left    had hammer toe surgery also and callus removed  . BUNIONECTOMY Right    also had hammer toe surgery and callus removed  . LEG SURGERY Left    PT  reports he has pins placed in his Lt leg  . ORIF TIBIA PLATEAU  10/09/2012   Dr Lorin Mercy  . ORIF TIBIA PLATEAU Left 10/08/2012   Procedure: OPEN REDUCTION INTERNAL FIXATION (ORIF) TIBIAL PLATEAU;  Surgeon: Marybelle Killings, MD;  Location: [facility];  Service: Orthopedics;  Laterality: Left;  Marland Kitchen VIDEO BRONCHOSCOPY WITH ENDOBRONCHIAL NAVIGATION N/A 02/25/2020   Procedure: VIDEO BRONCHOSCOPY WITH ENDOBRONCHIAL NAVIGATION with biopsies;  Surgeon: Collene Gobble, MD;  Location: [facility];  Service: Thoracic;  Laterality: N/A;  . VIDEO BRONCHOSCOPY WITH ENDOBRONCHIAL ULTRASOUND N/A 02/25/2020   Procedure: VIDEO BRONCHOSCOPY WITH ENDOBRONCHIAL ULTRASOUND;  Surgeon: Collene Gobble, MD;  Location: [facility];  Service: Thoracic;  Laterality: N/A;    REVIEW OF SYSTEMS:  Constitutional: positive for fatigue Eyes: negative Ears, nose, mouth, throat, and face: negative Respiratory: negative Cardiovascular: negative Gastrointestinal: negative Genitourinary:negative Integument/breast: negative Hematologic/lymphatic: negative Musculoskeletal:negative Neurological: negative Behavioral/Psych: negative Endocrine: negative Allergic/Immunologic: negative   PHYSICAL EXAMINATION: General appearance: alert, cooperative, fatigued and no distress Head: Normocephalic, without obvious abnormality, atraumatic Neck: no adenopathy, no JVD, supple, symmetrical, trachea midline and thyroid not enlarged, symmetric, no tenderness/mass/nodules Lymph nodes: Cervical, supraclavicular, and axillary nodes normal. Resp: clear to auscultation bilaterally Back: symmetric, no curvature. ROM normal. No CVA tenderness. Cardio: regular rate and rhythm, S1, S2 normal, no murmur, click, rub or gallop GI: soft, non-tender; bowel sounds normal; no masses,  no organomegaly Extremities: extremities normal, atraumatic, no cyanosis or edema Neurologic: Alert and oriented X 3, normal strength and tone. Normal symmetric reflexes. Normal coordination and  gait  ECOG PERFORMANCE STATUS: 1 - Symptomatic but completely ambulatory  Blood pressure 135/84, pulse 89, temperature 97.8 F (36.6 C), temperature source Tympanic, resp. rate 18, height 6\' 2"  (1.88 m), weight 187 lb 6.4 oz (85 kg), SpO2 100 %.  LABORATORY DATA: Lab Results  Component Value Date   WBC 4.8 09/01/2020   HGB 12.1 (L) 09/01/2020   HCT 35.8 (L) 09/01/2020   MCV 95.0 09/01/2020   PLT 273 09/01/2020      Chemistry      Component Value Date/Time   NA 141 09/01/2020 1033   K 3.9 09/01/2020 1033   CL 109 09/01/2020 1033   CO2 23 09/01/2020 1033   BUN 15 09/01/2020 1033   CREATININE 1.52 (H) 09/01/2020 1033      Component Value Date/Time   CALCIUM 9.4 09/01/2020 1033   ALKPHOS 110 09/01/2020 1033   AST 22 09/01/2020 1033   ALT 23 09/01/2020 1033   BILITOT 0.3 09/01/2020 1033       RADIOGRAPHIC STUDIES: CT Chest W Contrast  Result Date: 09/01/2020 CLINICAL DATA:  Primary Cancer Type: Non-small cell lung Imaging Indication: Assess response to therapy Interval therapy since last imaging? Yes Initial  Cancer Diagnosis Date: 02/25/2020; Established by: Biopsy-proven Detailed Pathology: Stage IV non-small cell lung cancer, adenocarcinoma. 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 Surgeries: No. Chemotherapy: Yes; Ongoing? Yes; Most recent administration: 08/10/2020 Immunotherapy?  Yes; Type: Keytruda; Ongoing? Yes Radiation therapy?  Yes; Date Range: 03/18/2020; Target: Brain Other Cancers: Thyroid cancer. EXAM: CT CHEST, ABDOMEN, AND PELVIS WITH CONTRAST TECHNIQUE: Multidetector CT imaging of the chest, abdomen and pelvis was performed following the standard protocol during bolus administration of intravenous contrast. CONTRAST:  156mL OMNIPAQUE IOHEXOL 300 MG/ML  SOLN COMPARISON:  Most recent CT chest, abdomen and pelvis 06/08/2020. FINDINGS: Cardiovascular: Atherosclerosis of the aorta, great vessels and coronary arteries. No acute vascular findings.  The heart size is normal. There is no pericardial effusion. Mediastinum/Nodes: There are no enlarged mediastinal, hilar or axillary lymph nodes. Stable hypodense 1.4 cm low-density left thyroid nodule on image 8/2, previously evaluated by ultrasound. The trachea and esophagus appear normal. Lungs/Pleura: No pleural effusion or pneumothorax. The spiculated right upper lobe lesion appears slightly smaller, measuring 3.4 x 2.2 cm on image 61/6 (previously 3.6 x 2.4 cm). Sub solid left upper lobe nodule measuring 6 mm on image 49/6 is stable. No new or enlarging pulmonary nodules identified. Musculoskeletal/Chest wall: No chest wall mass or suspicious osseous findings. CT ABDOMEN AND PELVIS FINDINGS Hepatobiliary: The liver is normal in density without suspicious focal abnormality. No evidence of gallstones, gallbladder wall thickening or biliary dilatation. Pancreas: Unremarkable. No pancreatic ductal dilatation or surrounding inflammatory changes. Spleen: Normal in size without focal abnormality. Adrenals/Urinary Tract: Both adrenal glands appear normal. The kidneys appear normal without evidence of urinary tract calculus, suspicious lesion or hydronephrosis. No bladder abnormalities are seen. Stomach/Bowel: The stomach appears unremarkable for its degree of distension. No evidence of bowel wall thickening, distention or surrounding inflammatory change. The appendix appears normal. Enteric contrast was administered. There is moderate stool throughout the colon. Vascular/Lymphatic: There are no enlarged abdominal or pelvic lymph nodes. Aortic and branch vessel atherosclerosis without acute vascular findings. The portal, superior mesenteric and splenic veins appear normal. Reproductive: Stable mild prostatomegaly. Other: No evidence of abdominal wall mass or hernia. No ascites. Musculoskeletal: No acute or significant osseous findings. IMPRESSION: 1. Slight interval decrease in size of spiculated right upper lobe  lesion. 2. No evidence of metastatic disease. 3. Stable sub solid left upper lobe nodule; attention on follow-up. 4. Aortic Atherosclerosis (ICD10-I70.0). Electronically Signed   By: Richardean Sale M.D.   On: 09/01/2020 09:46   MR Brain W Wo Contrast  Result Date: 08/25/2020 CLINICAL DATA:  Assess treatment response of brain neoplasm. EXAM: MRI HEAD WITHOUT AND WITH CONTRAST TECHNIQUE: Multiplanar, multiecho pulse sequences of the brain and surrounding structures were obtained without and with intravenous contrast. CONTRAST:  8.74mL GADAVIST GADOBUTROL 1 MMOL/ML IV SOLN COMPARISON:  03/13/2020 FINDINGS: BRAIN New Lesions: None. Larger lesions: None. Stable or Smaller lesions: 1. Right parietal parasagittal lesion marked on 1100:230, decreased in size and only subtly enhancing. 2. High left precentral gyrus lesion is no longer enhancing and only shows small volume chronic blood products. Other Brain findings: Remote perforator infarcts at the left corona radiata and right caudothalamic groove. No acute hemorrhage, hydrocephalus, or collection Vascular: Preserved flow voids and vascular enhancements. Skull and upper cervical spine: No focal bone lesion. C2-3 non segmentation and subjacent disc degeneration. Sinuses/Orbits: Negative IMPRESSION: Positive treatment response at the 2 cerebral metastases. No new or progressive disease. Electronically Signed   By: Monte Fantasia M.D.   On:  08/25/2020 05:02   CT Abdomen Pelvis W Contrast  Result Date: 09/01/2020 CLINICAL DATA:  Primary Cancer Type: Non-small cell lung Imaging Indication: Assess response to therapy Interval therapy since last imaging? Yes Initial Cancer Diagnosis Date: 02/25/2020; Established by: Biopsy-proven Detailed Pathology: Stage IV non-small cell lung cancer, adenocarcinoma. 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 Surgeries: No. Chemotherapy: Yes; Ongoing? Yes; Most recent administration: 08/10/2020 Immunotherapy?  Yes;  Type: Keytruda; Ongoing? Yes Radiation therapy?  Yes; Date Range: 03/18/2020; Target: Brain Other Cancers: Thyroid cancer. EXAM: CT CHEST, ABDOMEN, AND PELVIS WITH CONTRAST TECHNIQUE: Multidetector CT imaging of the chest, abdomen and pelvis was performed following the standard protocol during bolus administration of intravenous contrast. CONTRAST:  120mL OMNIPAQUE IOHEXOL 300 MG/ML  SOLN COMPARISON:  Most recent CT chest, abdomen and pelvis 06/08/2020. FINDINGS: Cardiovascular: Atherosclerosis of the aorta, great vessels and coronary arteries. No acute vascular findings. The heart size is normal. There is no pericardial effusion. Mediastinum/Nodes: There are no enlarged mediastinal, hilar or axillary lymph nodes. Stable hypodense 1.4 cm low-density left thyroid nodule on image 8/2, previously evaluated by ultrasound. The trachea and esophagus appear normal. Lungs/Pleura: No pleural effusion or pneumothorax. The spiculated right upper lobe lesion appears slightly smaller, measuring 3.4 x 2.2 cm on image 61/6 (previously 3.6 x 2.4 cm). Sub solid left upper lobe nodule measuring 6 mm on image 49/6 is stable. No new or enlarging pulmonary nodules identified. Musculoskeletal/Chest wall: No chest wall mass or suspicious osseous findings. CT ABDOMEN AND PELVIS FINDINGS Hepatobiliary: The liver is normal in density without suspicious focal abnormality. No evidence of gallstones, gallbladder wall thickening or biliary dilatation. Pancreas: Unremarkable. No pancreatic ductal dilatation or surrounding inflammatory changes. Spleen: Normal in size without focal abnormality. Adrenals/Urinary Tract: Both adrenal glands appear normal. The kidneys appear normal without evidence of urinary tract calculus, suspicious lesion or hydronephrosis. No bladder abnormalities are seen. Stomach/Bowel: The stomach appears unremarkable for its degree of distension. No evidence of bowel wall thickening, distention or surrounding inflammatory  change. The appendix appears normal. Enteric contrast was administered. There is moderate stool throughout the colon. Vascular/Lymphatic: There are no enlarged abdominal or pelvic lymph nodes. Aortic and branch vessel atherosclerosis without acute vascular findings. The portal, superior mesenteric and splenic veins appear normal. Reproductive: Stable mild prostatomegaly. Other: No evidence of abdominal wall mass or hernia. No ascites. Musculoskeletal: No acute or significant osseous findings. IMPRESSION: 1. Slight interval decrease in size of spiculated right upper lobe lesion. 2. No evidence of metastatic disease. 3. Stable sub solid left upper lobe nodule; attention on follow-up. 4. Aortic Atherosclerosis (ICD10-I70.0). Electronically Signed   By: Richardean Sale M.D.   On: 09/01/2020 09:46    ASSESSMENT AND PLAN: This is a very pleasant 65 years old African-American male diagnosed with stage IV non-small cell lung cancer, adenocarcinoma presented with right upper lobe nodule in addition to right hilar, infrahilar, subcarinal and right paratracheal adenopathy in addition to subcutaneous nodules in the left axilla and 2 small metastatic brain lesion diagnosed in August 2021.  The patient has no actionable mutations. He is currently undergoing systemic chemotherapy with carboplatin for AUC of 5, Alimta 500 mg/M2 and Keytruda 200 mg IV every 3 weeks.  Status post 7 cycles.  Starting from cycle #5 he will be on maintenance treatment with Alimta and Keytruda every 3 weeks.  I reduce the dose of his Alimta to 400 mg/M2 secondary to the renal insufficiency. The patient has been tolerating this treatment well with  no concerning adverse effect except for mild fatigue. He had repeat CT scan of the chest, abdomen pelvis performed recently.  I personally and independently reviewed the scan and discussed the results with the patient today. His scan showed no concerning findings for disease progression and there was  further improvement in the size of the spiculated right upper lobe lesion. I recommended for the patient to continue his current maintenance treatment with Alimta at a reduced dose as well as Keytruda. He will come back for follow-up visit in 3 weeks for evaluation before starting cycle #9. The patient was advised to call immediately if he has any concerning symptoms in the interval. The patient voices understanding of current disease status and treatment options and is in agreement with the current care plan.  All questions were answered. The patient knows to call the clinic with any problems, questions or concerns. We can certainly see the patient much sooner if necessary.   Disclaimer: This note was dictated with voice recognition software. Similar sounding words can inadvertently be transcribed and may not be corrected upon review.

## 2020-09-03 ENCOUNTER — Telehealth: Payer: Self-pay | Admitting: Internal Medicine

## 2020-09-03 NOTE — Telephone Encounter (Signed)
Per 2/9 los, next appts already scheduled

## 2020-09-22 ENCOUNTER — Ambulatory Visit: Payer: No Typology Code available for payment source

## 2020-09-22 ENCOUNTER — Telehealth: Payer: Self-pay | Admitting: Medical Oncology

## 2020-09-22 ENCOUNTER — Ambulatory Visit: Payer: No Typology Code available for payment source | Admitting: Internal Medicine

## 2020-09-22 ENCOUNTER — Other Ambulatory Visit: Payer: No Typology Code available for payment source

## 2020-09-22 NOTE — Telephone Encounter (Signed)
Missed appt today due to he is moving out of his apt. Schedule message sent for appts for next week.

## 2020-09-23 ENCOUNTER — Telehealth: Payer: Self-pay | Admitting: Internal Medicine

## 2020-09-23 NOTE — Telephone Encounter (Signed)
Scheduled appt per 3/2 sch msg - pt is aware of new apt on 3/10

## 2020-09-27 NOTE — Progress Notes (Signed)
[place] [id number]  DIAGNOSIS: Stage IV non-small cell lung cancer,favored to be adenocarcinoma. He presented with posterior right upper lobe nodule as well as right hilar, infrahilar, subcarinal, and right paratracheal lymphadenopathy. He also has a 1.4 cm x 1 cm soft tissue nodule in the skin/subcutaneous fat in the left axilla. 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 He was diagnosed in August 2021  Molecular Biomarkers: BIOMARKER(S)% CFDNA OR AMPLIFICATIONASSOCIATED FDA-APPROVED THERAPIESCLINICAL TRIAL AVAILABILITY [id number] 2.9% None Yes [id number] 0.4% None Yes [id number] 0.2% None Yes  PRIOR THERAPY: 1) SRS to the small metastatic brain lesion under the care of Dr. [name] on 03/18/20  CURRENT THERAPY: Palliative systemic chemotherapy with carboplatin for an AUC of 5, Alimta 500 mg/m2, and Keytruda 200 mg IV every 3 weeks. First dose expected on 04/07/20. Status post 8 cycles. Starting from cycle #5 he will be on maintenance treatment with Alimta and Keytruda every 3 weeks. His dose of Alimta was reduced to 400 mg/m2.   INTERVAL HISTORY: Randy Andersen 65 y.o. male returns to the clinic today for a follow up visit. The patient is feeling well today without any concerning complaints except for he needs a refill of his Xarelto.   Otherwise, the patient missed his appointment for treatment last week due to moving out of his apartment. Otherwise, he denies any signs or symptoms of infection including nasal congestion, sore throat, skin infections, abdominal pain, or dysuria. He denies fevers, chills, recent night sweats, shortness of breath or cough. He denies nausea, vomiting, diarrhea, or constipation. Denies chest pain or hemoptysis. He denies rashes or skin changes. He denies headaches or visual changes. He is  here for evaluation before starting cycle #9.    MEDICAL HISTORY: Past Medical History:  Diagnosis Date  . Asthma    as a child  . Chronic pain disorder    LT leg and foot  . Heart murmur    as a child  . Hypertension   . Malignant neoplasm of upper lobe of right lung ([place])   . Stroke [facility])    mini stroke - 2015, some tingling in fingers on right     ALLERGIES:  has No Known Allergies.  MEDICATIONS:  Current Outpatient Medications  Medication Sig Dispense Refill  . amLODipine (NORVASC) 10 MG tablet Take 10 mg by mouth daily.    Marland Kitchen doxylamine, Sleep, (UNISOM) 25 MG tablet Take 25 mg by mouth at bedtime as needed for sleep. (Patient not taking: Reported on 09/01/2020)    . folic acid (FOLVITE) 1 MG tablet Take 1 tablet (1 mg total) by mouth daily. (Patient not taking: Reported on 09/01/2020) 30 tablet 2  . HYDROcodone-acetaminophen (NORCO) 10-325 MG tablet Take 1 tablet by mouth 2 (two) times daily.    . phenazopyridine (PYRIDIUM) 200 MG tablet Take 1 tablet (200 mg total) by mouth 3 (three) times daily. (Patient not taking: Reported on 09/01/2020) 6 tablet 0  . prochlorperazine (COMPAZINE) 10 MG tablet Take 1 tablet (10 mg total) by mouth every 6 (six) hours as needed. (Patient not taking: Reported on 09/01/2020) 30 tablet 2  . rivaroxaban (XARELTO) 20 MG TABS tablet Take 1 tablet (20 mg total) by mouth daily with supper. 30 tablet 2  . tamsulosin (FLOMAX) 0.4 MG CAPS capsule Take 1 capsule (0.4 mg total) by mouth daily. 30 capsule 2  . temazepam (RESTORIL) 15 MG capsule  Take 1 capsule (15 mg total) by mouth at bedtime as needed for sleep. Pt is veteran - Call pt to set up delivery (Patient not taking: Reported on 09/01/2020) 30 capsule 0   No current facility-administered medications for this visit.   Facility-Administered Medications Ordered in Other Visits  Medication Dose Route Frequency Provider Last Rate Last Admin  . pembrolizumab (KEYTRUDA) 200 mg in sodium chloride 0.9 % 50 mL  chemo infusion  200 mg Intravenous Once [name], MD      . PEMEtrexed (ALIMTA) 800 mg in sodium chloride 0.9 % 100 mL chemo infusion  400 mg/m2 (Treatment Plan Recorded) Intravenous Once [name], MD        SURGICAL HISTORY:  Past Surgical History:  Procedure Laterality Date  . BUNIONECTOMY Left    had hammer toe surgery also and callus removed  . BUNIONECTOMY Right    also had hammer toe surgery and callus removed  . LEG SURGERY Left    PT reports he has pins placed in his Lt leg  . ORIF TIBIA PLATEAU  10/09/2012   Dr Lorin Mercy  . ORIF TIBIA PLATEAU Left 10/08/2012   Procedure: OPEN REDUCTION INTERNAL FIXATION (ORIF) TIBIAL PLATEAU;  Surgeon: Marybelle Killings, MD;  Location: [facility];  Service: Orthopedics;  Laterality: Left;  Marland Kitchen VIDEO BRONCHOSCOPY WITH ENDOBRONCHIAL NAVIGATION N/A 02/25/2020   Procedure: VIDEO BRONCHOSCOPY WITH ENDOBRONCHIAL NAVIGATION with biopsies;  Surgeon: Collene Gobble, MD;  Location: [facility];  Service: Thoracic;  Laterality: N/A;  . VIDEO BRONCHOSCOPY WITH ENDOBRONCHIAL ULTRASOUND N/A 02/25/2020   Procedure: VIDEO BRONCHOSCOPY WITH ENDOBRONCHIAL ULTRASOUND;  Surgeon: Collene Gobble, MD;  Location: [facility];  Service: Thoracic;  Laterality: N/A;    REVIEW OF SYSTEMS:   Review of Systems  Constitutional: Negative for appetite change, chills, fatigue, fever and unexpected weight change.  HENT:   Negative for mouth sores, nosebleeds, sore throat and trouble swallowing.   Eyes: Negative for eye problems and icterus.  Respiratory: Negative for cough, hemoptysis, shortness of breath and wheezing.   Cardiovascular: Negative for chest pain and leg swelling.  Gastrointestinal: Negative for abdominal pain, constipation, diarrhea, nausea and vomiting.  Genitourinary: Negative for bladder incontinence, difficulty urinating, dysuria, frequency and hematuria.   Musculoskeletal: Negative for back pain, gait problem, neck pain and neck stiffness.  Skin: Negative for itching and  rash.  Neurological: Negative for dizziness, extremity weakness, gait problem, headaches, light-headedness and seizures.  Hematological: Negative for adenopathy. Does not bruise/bleed easily.  Psychiatric/Behavioral: Negative for confusion, depression and sleep disturbance. The patient is not nervous/anxious.     PHYSICAL EXAMINATION:  Blood pressure (!) 137/99, pulse 67, temperature (!) 96.6 F (35.9 C), temperature source Tympanic, resp. rate 13, height 6\' 2"  (1.88 m), weight 190 lb 11.2 oz (86.5 kg), SpO2 100 %.  ECOG PERFORMANCE STATUS: 1 - Symptomatic but completely ambulatory  Physical Exam  Constitutional: Oriented to person, place, and time and well-developed, well-nourished, and in no distress. No distress.  HENT:  Head: Normocephalic and atraumatic.  Mouth/Throat: Oropharynx is clear and moist. No oropharyngeal exudate.  Eyes: Conjunctivae are normal. Right eye exhibits no discharge. Left eye exhibits no discharge. No scleral icterus.  Neck: Normal range of motion. Neck supple.  Cardiovascular: Normal rate, regular rhythm, normal heart sounds and intact distal pulses.   Pulmonary/Chest: Effort normal and breath sounds normal. No respiratory distress. No wheezes. No rales.  Abdominal: Soft. Bowel sounds are normal. Exhibits no distension and no mass. There is no tenderness.  Musculoskeletal: Normal range of motion. Exhibits no edema.  Lymphadenopathy:    No cervical adenopathy.  Neurological: Alert and oriented to person, place, and time. Exhibits normal muscle tone. Gait normal. Coordination normal.  Skin: Skin is warm and dry. No rash noted. Not diaphoretic. No erythema. No pallor.  Psychiatric: Mood, memory and judgment normal.  Vitals reviewed.  LABORATORY DATA: Lab Results  Component Value Date   WBC 5.0 09/30/2020   HGB 11.5 (L) 09/30/2020   HCT 34.7 (L) 09/30/2020   MCV 95.3 09/30/2020   PLT 229 09/30/2020      Chemistry      Component Value Date/Time   NA  139 09/30/2020 1309   K 4.3 09/30/2020 1309   CL 108 09/30/2020 1309   CO2 24 09/30/2020 1309   BUN 19 09/30/2020 1309   CREATININE 1.64 (H) 09/30/2020 1309      Component Value Date/Time   CALCIUM 9.3 09/30/2020 1309   ALKPHOS 101 09/30/2020 1309   AST 22 09/30/2020 1309   ALT 18 09/30/2020 1309   BILITOT 0.3 09/30/2020 1309       RADIOGRAPHIC STUDIES:  CT Chest W Contrast  Result Date: 09/01/2020 CLINICAL DATA:  Primary Cancer Type: Non-small cell lung Imaging Indication: Assess response to therapy Interval therapy since last imaging? Yes Initial Cancer Diagnosis Date: 02/25/2020; Established by: Biopsy-proven Detailed Pathology: Stage IV non-small cell lung cancer, adenocarcinoma. 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 Surgeries: No. Chemotherapy: Yes; Ongoing? Yes; Most recent administration: 08/10/2020 Immunotherapy?  Yes; Type: Keytruda; Ongoing? Yes Radiation therapy?  Yes; Date Range: 03/18/2020; Target: Brain Other Cancers: Thyroid cancer. EXAM: CT CHEST, ABDOMEN, AND PELVIS WITH CONTRAST TECHNIQUE: Multidetector CT imaging of the chest, abdomen and pelvis was performed following the standard protocol during bolus administration of intravenous contrast. CONTRAST:  156mL OMNIPAQUE IOHEXOL 300 MG/ML  SOLN COMPARISON:  Most recent CT chest, abdomen and pelvis 06/08/2020. FINDINGS: Cardiovascular: Atherosclerosis of the aorta, great vessels and coronary arteries. No acute vascular findings. The heart size is normal. There is no pericardial effusion. Mediastinum/Nodes: There are no enlarged mediastinal, hilar or axillary lymph nodes. Stable hypodense 1.4 cm low-density left thyroid nodule on image 8/2, previously evaluated by ultrasound. The trachea and esophagus appear normal. Lungs/Pleura: No pleural effusion or pneumothorax. The spiculated right upper lobe lesion appears slightly smaller, measuring 3.4 x 2.2 cm on image 61/6 (previously 3.6 x 2.4 cm). Sub solid  left upper lobe nodule measuring 6 mm on image 49/6 is stable. No new or enlarging pulmonary nodules identified. Musculoskeletal/Chest wall: No chest wall mass or suspicious osseous findings. CT ABDOMEN AND PELVIS FINDINGS Hepatobiliary: The liver is normal in density without suspicious focal abnormality. No evidence of gallstones, gallbladder wall thickening or biliary dilatation. Pancreas: Unremarkable. No pancreatic ductal dilatation or surrounding inflammatory changes. Spleen: Normal in size without focal abnormality. Adrenals/Urinary Tract: Both adrenal glands appear normal. The kidneys appear normal without evidence of urinary tract calculus, suspicious lesion or hydronephrosis. No bladder abnormalities are seen. Stomach/Bowel: The stomach appears unremarkable for its degree of distension. No evidence of bowel wall thickening, distention or surrounding inflammatory change. The appendix appears normal. Enteric contrast was administered. There is moderate stool throughout the colon. Vascular/Lymphatic: There are no enlarged abdominal or pelvic lymph nodes. Aortic and branch vessel atherosclerosis without acute vascular findings. The portal, superior mesenteric and splenic veins appear normal. Reproductive: Stable mild prostatomegaly. Other: No evidence of abdominal wall mass or hernia. No ascites. Musculoskeletal: No acute or significant  osseous findings. IMPRESSION: 1. Slight interval decrease in size of spiculated right upper lobe lesion. 2. No evidence of metastatic disease. 3. Stable sub solid left upper lobe nodule; attention on follow-up. 4. Aortic Atherosclerosis (ICD10-I70.0). Electronically Signed   By: Richardean Sale M.D.   On: 09/01/2020 09:46   CT Abdomen Pelvis W Contrast  Result Date: 09/01/2020 CLINICAL DATA:  Primary Cancer Type: Non-small cell lung Imaging Indication: Assess response to therapy Interval therapy since last imaging? Yes Initial Cancer Diagnosis Date: 02/25/2020; Established  by: Biopsy-proven Detailed Pathology: Stage IV non-small cell lung cancer, adenocarcinoma. 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 Surgeries: No. Chemotherapy: Yes; Ongoing? Yes; Most recent administration: 08/10/2020 Immunotherapy?  Yes; Type: Keytruda; Ongoing? Yes Radiation therapy?  Yes; Date Range: 03/18/2020; Target: Brain Other Cancers: Thyroid cancer. EXAM: CT CHEST, ABDOMEN, AND PELVIS WITH CONTRAST TECHNIQUE: Multidetector CT imaging of the chest, abdomen and pelvis was performed following the standard protocol during bolus administration of intravenous contrast. CONTRAST:  129mL OMNIPAQUE IOHEXOL 300 MG/ML  SOLN COMPARISON:  Most recent CT chest, abdomen and pelvis 06/08/2020. FINDINGS: Cardiovascular: Atherosclerosis of the aorta, great vessels and coronary arteries. No acute vascular findings. The heart size is normal. There is no pericardial effusion. Mediastinum/Nodes: There are no enlarged mediastinal, hilar or axillary lymph nodes. Stable hypodense 1.4 cm low-density left thyroid nodule on image 8/2, previously evaluated by ultrasound. The trachea and esophagus appear normal. Lungs/Pleura: No pleural effusion or pneumothorax. The spiculated right upper lobe lesion appears slightly smaller, measuring 3.4 x 2.2 cm on image 61/6 (previously 3.6 x 2.4 cm). Sub solid left upper lobe nodule measuring 6 mm on image 49/6 is stable. No new or enlarging pulmonary nodules identified. Musculoskeletal/Chest wall: No chest wall mass or suspicious osseous findings. CT ABDOMEN AND PELVIS FINDINGS Hepatobiliary: The liver is normal in density without suspicious focal abnormality. No evidence of gallstones, gallbladder wall thickening or biliary dilatation. Pancreas: Unremarkable. No pancreatic ductal dilatation or surrounding inflammatory changes. Spleen: Normal in size without focal abnormality. Adrenals/Urinary Tract: Both adrenal glands appear normal. The kidneys appear normal without  evidence of urinary tract calculus, suspicious lesion or hydronephrosis. No bladder abnormalities are seen. Stomach/Bowel: The stomach appears unremarkable for its degree of distension. No evidence of bowel wall thickening, distention or surrounding inflammatory change. The appendix appears normal. Enteric contrast was administered. There is moderate stool throughout the colon. Vascular/Lymphatic: There are no enlarged abdominal or pelvic lymph nodes. Aortic and branch vessel atherosclerosis without acute vascular findings. The portal, superior mesenteric and splenic veins appear normal. Reproductive: Stable mild prostatomegaly. Other: No evidence of abdominal wall mass or hernia. No ascites. Musculoskeletal: No acute or significant osseous findings. IMPRESSION: 1. Slight interval decrease in size of spiculated right upper lobe lesion. 2. No evidence of metastatic disease. 3. Stable sub solid left upper lobe nodule; attention on follow-up. 4. Aortic Atherosclerosis (ICD10-I70.0). Electronically Signed   By: Richardean Sale M.D.   On: 09/01/2020 09:46     ASSESSMENT/PLAN:  This is a very pleasant 65 year old African-American male with stage IV non-small cell lung cancer, favored to be adenocarcinoma. He presented with posterior right upper lobe nodule as well as right hilar, infrahilar, subcarinal, and right paratracheal lymphadenopathy. He also has a 1.4 cm x 1 cm soft tissue nodule in the skin/subcutaneous fat in the left axilla. 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 He was diagnosed in August 2021. Molecular studies by [facility] arenegativefor any actionable  mutations.  The patient completed SRS to the small metastatic brain lesion under the care of Dr. [name] on 03/18/20  The patient is currently undergoing palliative systemic chemotherapy with carboplatin for an AUC of 5, Alimta 500 mg per metered squared, and Keytruda 200 mg IV every 3 weeks.  He is status post 8  cycles. The dose of alimta was reduced to 400 mg/m2 due to renal insufficiency.   Labs were reviewed. Recommend that he procced with cycle 9 today as scheduled with dose reduced Alimta and Keytuda.   We will see him back for a follow up visit in 3 weeks for evaluation before starting cycle #10.   I have sent a refill of his Xarelto to the [facility] per patient request.   The patient was advised to call immediately if he has any concerning symptoms in the interval. The patient voices understanding of current disease status and treatment options and is in agreement with the current care plan. All questions were answered. The patient knows to call the clinic with any problems, questions or concerns. We can certainly see the patient much sooner if necessary         No orders of the defined types were placed in this encounter.    I spent 20-29 minutes in this encounter.   Laquetta Racey L Na Waldrip, PA-C 09/30/20

## 2020-09-30 ENCOUNTER — Other Ambulatory Visit: Payer: Self-pay

## 2020-09-30 ENCOUNTER — Inpatient Hospital Stay: Payer: No Typology Code available for payment source

## 2020-09-30 ENCOUNTER — Telehealth: Payer: Self-pay | Admitting: Physician Assistant

## 2020-09-30 ENCOUNTER — Inpatient Hospital Stay (HOSPITAL_BASED_OUTPATIENT_CLINIC_OR_DEPARTMENT_OTHER): Payer: No Typology Code available for payment source | Admitting: Physician Assistant

## 2020-09-30 ENCOUNTER — Other Ambulatory Visit: Payer: Self-pay | Admitting: Physician Assistant

## 2020-09-30 ENCOUNTER — Inpatient Hospital Stay: Payer: No Typology Code available for payment source | Attending: Internal Medicine

## 2020-09-30 VITALS — BP 137/99 | HR 67 | Temp 96.6°F | Resp 13 | Ht 74.0 in | Wt 190.7 lb

## 2020-09-30 DIAGNOSIS — Z5111 Encounter for antineoplastic chemotherapy: Secondary | ICD-10-CM

## 2020-09-30 DIAGNOSIS — Z79899 Other long term (current) drug therapy: Secondary | ICD-10-CM | POA: Insufficient documentation

## 2020-09-30 DIAGNOSIS — C3411 Malignant neoplasm of upper lobe, right bronchus or lung: Secondary | ICD-10-CM | POA: Diagnosis not present

## 2020-09-30 DIAGNOSIS — C7931 Secondary malignant neoplasm of brain: Secondary | ICD-10-CM | POA: Diagnosis not present

## 2020-09-30 DIAGNOSIS — C73 Malignant neoplasm of thyroid gland: Secondary | ICD-10-CM | POA: Insufficient documentation

## 2020-09-30 DIAGNOSIS — Z5112 Encounter for antineoplastic immunotherapy: Secondary | ICD-10-CM | POA: Diagnosis not present

## 2020-09-30 LAB — CBC WITH DIFFERENTIAL (CANCER CENTER ONLY)
Abs Immature Granulocytes: 0.01 10*3/uL (ref 0.00–0.07)
Basophils Absolute: 0.1 10*3/uL (ref 0.0–0.1)
Basophils Relative: 1 %
Eosinophils Absolute: 0.1 10*3/uL (ref 0.0–0.5)
Eosinophils Relative: 2 %
HCT: 34.7 % — ABNORMAL LOW (ref 39.0–52.0)
Hemoglobin: 11.5 g/dL — ABNORMAL LOW (ref 13.0–17.0)
Immature Granulocytes: 0 %
Lymphocytes Relative: 24 %
Lymphs Abs: 1.2 10*3/uL (ref 0.7–4.0)
MCH: 31.6 pg (ref 26.0–34.0)
MCHC: 33.1 g/dL (ref 30.0–36.0)
MCV: 95.3 fL (ref 80.0–100.0)
Monocytes Absolute: 0.6 10*3/uL (ref 0.1–1.0)
Monocytes Relative: 12 %
Neutro Abs: 3.1 10*3/uL (ref 1.7–7.7)
Neutrophils Relative %: 61 %
Platelet Count: 229 10*3/uL (ref 150–400)
RBC: 3.64 MIL/uL — ABNORMAL LOW (ref 4.22–5.81)
RDW: 15.4 % (ref 11.5–15.5)
WBC Count: 5 10*3/uL (ref 4.0–10.5)
nRBC: 0 % (ref 0.0–0.2)

## 2020-09-30 LAB — CMP (CANCER CENTER ONLY)
ALT: 18 U/L (ref 0–44)
AST: 22 U/L (ref 15–41)
Albumin: 3.7 g/dL (ref 3.5–5.0)
Alkaline Phosphatase: 101 U/L (ref 38–126)
Anion gap: 7 (ref 5–15)
BUN: 19 mg/dL (ref 8–23)
CO2: 24 mmol/L (ref 22–32)
Calcium: 9.3 mg/dL (ref 8.9–10.3)
Chloride: 108 mmol/L (ref 98–111)
Creatinine: 1.64 mg/dL — ABNORMAL HIGH (ref 0.61–1.24)
GFR, Estimated: 46 mL/min — ABNORMAL LOW (ref >60)
Glucose, Bld: 91 mg/dL (ref 70–99)
Potassium: 4.3 mmol/L (ref 3.5–5.1)
Sodium: 139 mmol/L (ref 135–145)
Total Bilirubin: 0.3 mg/dL (ref 0.3–1.2)
Total Protein: 7.8 g/dL (ref 6.5–8.1)

## 2020-09-30 LAB — TSH: TSH: 1.345 u[IU]/mL (ref 0.320–4.118)

## 2020-09-30 MED ORDER — RIVAROXABAN 20 MG PO TABS: 20.0000 mg | ORAL_TABLET | Freq: Every day | ORAL | 2 refills | Status: DC

## 2020-09-30 MED ORDER — CYANOCOBALAMIN 1000 MCG/ML IJ SOLN
1000.0000 ug | Freq: Once | INTRAMUSCULAR | Status: AC
  Administered 2020-09-30: 1000 ug via INTRAMUSCULAR

## 2020-09-30 MED ORDER — CYANOCOBALAMIN 1000 MCG/ML IJ SOLN
INTRAMUSCULAR | Status: AC
  Filled 2020-09-30: qty 1

## 2020-09-30 MED ORDER — PROCHLORPERAZINE MALEATE 10 MG PO TABS
10.0000 mg | ORAL_TABLET | Freq: Once | ORAL | Status: AC
  Administered 2020-09-30: 10 mg via ORAL

## 2020-09-30 MED ORDER — SODIUM CHLORIDE 0.9 % IV SOLN
400.0000 mg/m2 | Freq: Once | INTRAVENOUS | Status: AC
  Administered 2020-09-30: 800 mg via INTRAVENOUS
  Filled 2020-09-30: qty 20

## 2020-09-30 MED ORDER — PROCHLORPERAZINE MALEATE 10 MG PO TABS
ORAL_TABLET | ORAL | Status: AC
  Filled 2020-09-30: qty 1

## 2020-09-30 MED ORDER — PEMBROLIZUMAB CHEMO INJECTION 100 MG/4ML
200.0000 mg | Freq: Once | INTRAVENOUS | Status: AC
  Administered 2020-09-30: 200 mg via INTRAVENOUS
  Filled 2020-09-30: qty 8

## 2020-09-30 MED ORDER — SODIUM CHLORIDE 0.9 % IV SOLN
Freq: Once | INTRAVENOUS | Status: AC
  Filled 2020-09-30: qty 250

## 2020-09-30 MED FILL — XARELTO 20 MG TABLET: 20 | 30 days supply | Qty: 30 | Fill #0

## 2020-09-30 NOTE — Patient Instructions (Signed)
[facility] Discharge Instructions for Patients Receiving Chemotherapy  Today you received the following immunotherapy agent: Pembrolizumab (Keytruda) and chemotherapy agent: Alimta  To help prevent nausea and vomiting after your treatment, we encourage you to take your nausea medication as directed by your MD.   If you develop nausea and vomiting that is not controlled by your nausea medication, call the clinic.   BELOW ARE SYMPTOMS THAT SHOULD BE REPORTED IMMEDIATELY:  *FEVER GREATER THAN 100.5 F  *CHILLS WITH OR WITHOUT FEVER  NAUSEA AND VOMITING THAT IS NOT CONTROLLED WITH YOUR NAUSEA MEDICATION  *UNUSUAL SHORTNESS OF BREATH  *UNUSUAL BRUISING OR BLEEDING  TENDERNESS IN MOUTH AND THROAT WITH OR WITHOUT PRESENCE OF ULCERS  *URINARY PROBLEMS  *BOWEL PROBLEMS  UNUSUAL RASH Items with * indicate a potential emergency and should be followed up as soon as possible.  Feel free to call the clinic should you have any questions or concerns. The clinic phone number is [phone].  Please show the [facility] at check-in to the Emergency Department and triage nurse.

## 2020-09-30 NOTE — Telephone Encounter (Signed)
Scheduled appt per 3/10 los- gave patient AVS and calender per los

## 2020-10-13 ENCOUNTER — Other Ambulatory Visit: Payer: No Typology Code available for payment source

## 2020-10-13 ENCOUNTER — Ambulatory Visit: Payer: No Typology Code available for payment source

## 2020-10-13 ENCOUNTER — Other Ambulatory Visit: Payer: Self-pay | Admitting: Radiation Therapy

## 2020-10-13 ENCOUNTER — Ambulatory Visit: Payer: No Typology Code available for payment source | Admitting: Internal Medicine

## 2020-10-13 DIAGNOSIS — C7931 Secondary malignant neoplasm of brain: Secondary | ICD-10-CM

## 2020-10-13 DIAGNOSIS — C7949 Secondary malignant neoplasm of other parts of nervous system: Secondary | ICD-10-CM

## 2020-10-17 NOTE — Progress Notes (Signed)
Randy Andersen Randy Andersen [id number]  DIAGNOSIS: Stage IV non-small cell lung cancer,favored to be adenocarcinoma. He presented with posterior right upper lobe nodule as well as right hilar, infrahilar, subcarinal, and right paratracheal lymphadenopathy. He also has a 1.4 cm x 1 cm soft tissue nodule in the skin/subcutaneous fat in the left axilla. 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 He was diagnosed in August 2021  Molecular Biomarkers: BIOMARKER(S)% CFDNA OR AMPLIFICATIONASSOCIATED FDA-APPROVED THERAPIESCLINICAL TRIAL AVAILABILITY [id number] 2.9% None Yes [id number] 0.4% None Yes [id number] 0.2% None Yes  PRIOR THERAPY: SRS to the small metastatic brain lesion under the care of Dr. [name] on 03/18/20  CURRENT THERAPY: Palliative systemic chemotherapy with carboplatin for an AUC of 5, Alimta 500 mg/m2, and Keytruda 200 mg IV every 3 weeks. First dose expected on 04/07/20. Status post 9 cycles.Starting from cycle #5 he will be on maintenance treatment with Alimta and Keytruda every 3 weeks. His dose of Alimta was reduced to 400 mg/m2.   INTERVAL HISTORY: Randy Andersen 65 y.o. male returns to the clinic today for a follow up visit. The patient is feeling well today without any concerning complaints. He is tolerating his treatment well without any adverse side effects.   He denies any signs or symptoms of infection including nasal congestion, sore throat, skin infections, abdominal pain, or dysuria. He denies fevers, chills, recent night sweats, shortness of breath or cough. He denies nausea, vomiting, diarrhea, or constipation. Denies chest pain or hemoptysis. He denies rashes or skin changes. He denies headaches or visual changes. He is here for evaluation before starting cycle #10.   MEDICAL HISTORY: Past Medical History:   Diagnosis Date  . Asthma    as a child  . Chronic pain disorder    LT leg and foot  . Heart murmur    as a child  . Hypertension   . Malignant neoplasm of upper lobe of right lung ()   . Stroke [facility])    mini stroke - 2015, some tingling in fingers on right     ALLERGIES:  has No Known Allergies.  MEDICATIONS:  Current Outpatient Medications  Medication Sig Dispense Refill  . amLODipine (NORVASC) 10 MG tablet Take 10 mg by mouth daily.    Marland Kitchen doxylamine, Sleep, (UNISOM) 25 MG tablet Take 25 mg by mouth at bedtime as needed for sleep. (Patient not taking: Reported on 09/01/2020)    . folic acid (FOLVITE) 1 MG tablet Take 1 tablet (1 mg total) by mouth daily. (Patient not taking: Reported on 09/01/2020) 30 tablet 2  . HYDROcodone-acetaminophen (NORCO) 10-325 MG tablet Take 1 tablet by mouth 2 (two) times daily.    . phenazopyridine (PYRIDIUM) 200 MG tablet Take 1 tablet (200 mg total) by mouth 3 (three) times daily. (Patient not taking: Reported on 09/01/2020) 6 tablet 0  . prochlorperazine (COMPAZINE) 10 MG tablet Take 1 tablet (10 mg total) by mouth every 6 (six) hours as needed. (Patient not taking: Reported on 09/01/2020) 30 tablet 2  . rivaroxaban (XARELTO) 20 MG TABS tablet Take 1 tablet (20 mg total) by mouth daily with supper. 30 tablet 2  . tamsulosin (FLOMAX) 0.4 MG CAPS capsule Take 1 capsule (0.4 mg total) by mouth daily. 30 capsule 2  . temazepam (RESTORIL) 15 MG capsule Take 1 capsule (15 mg total) by mouth at bedtime as needed for sleep. Pt is veteran - Call  pt to set up delivery (Patient not taking: Reported on 09/01/2020) 30 capsule 0   No current facility-administered medications for this visit.    SURGICAL HISTORY:  Past Surgical History:  Procedure Laterality Date  . BUNIONECTOMY Left    had hammer toe surgery also and callus removed  . BUNIONECTOMY Right    also had hammer toe surgery and callus removed  . LEG SURGERY Left    PT reports he has pins placed in his Lt  leg  . ORIF TIBIA PLATEAU  10/09/2012   Dr Lorin Mercy  . ORIF TIBIA PLATEAU Left 10/08/2012   Procedure: OPEN REDUCTION INTERNAL FIXATION (ORIF) TIBIAL PLATEAU;  Surgeon: Marybelle Killings, MD;  Location: [place];  Service: Orthopedics;  Laterality: Left;  Marland Kitchen VIDEO BRONCHOSCOPY WITH ENDOBRONCHIAL NAVIGATION N/A 02/25/2020   Procedure: VIDEO BRONCHOSCOPY WITH ENDOBRONCHIAL NAVIGATION with biopsies;  Surgeon: Collene Gobble, MD;  Location: [place];  Service: Thoracic;  Laterality: N/A;  . VIDEO BRONCHOSCOPY WITH ENDOBRONCHIAL ULTRASOUND N/A 02/25/2020   Procedure: VIDEO BRONCHOSCOPY WITH ENDOBRONCHIAL ULTRASOUND;  Surgeon: Collene Gobble, MD;  Location[facility];  Service: Thoracic;  Laterality: N/A;    REVIEW OF SYSTEMS:   Review of Systems  Constitutional: Negative for appetite change, chills, fatigue, fever and unexpected weight change.  HENT:   Negative for mouth sores, nosebleeds, sore throat and trouble swallowing.   Eyes: Negative for eye problems and icterus.  Respiratory: Negative for cough, hemoptysis, shortness of breath and wheezing.   Cardiovascular: Negative for chest pain and leg swelling.  Gastrointestinal: Negative for abdominal pain, constipation, diarrhea, nausea and vomiting.  Genitourinary: Negative for bladder incontinence, difficulty urinating, dysuria, frequency and hematuria.   Musculoskeletal: Negative for back pain, gait problem, neck pain and neck stiffness.  Skin: Negative for itching and rash.  Neurological: Negative for dizziness, extremity weakness, gait problem, headaches, light-headedness and seizures.  Hematological: Negative for adenopathy. Does not bruise/bleed easily.  Psychiatric/Behavioral: Negative for confusion, depression and sleep disturbance. The patient is not nervous/anxious.     PHYSICAL EXAMINATION:  There were no vitals taken for this visit.  ECOG PERFORMANCE STATUS: 1 - Symptomatic but completely ambulatory  Physical Exam  Constitutional: Oriented to  person, place, and time and well-developed, well-nourished, and in no distress. No distress.  HENT:  Head: Normocephalic and atraumatic.  Mouth/Throat: Oropharynx is clear and moist. No oropharyngeal exudate.  Eyes: Conjunctivae are normal. Right eye exhibits no discharge. Left eye exhibits no discharge. No scleral icterus.  Neck: Normal range of motion. Neck supple.  Cardiovascular: Normal rate, regular rhythm, normal heart sounds and intact distal pulses.   Pulmonary/Chest: Effort normal and breath sounds normal. No respiratory distress. No wheezes. No rales.  Abdominal: Soft. Bowel sounds are normal. Exhibits no distension and no mass. There is no tenderness.  Musculoskeletal: Normal range of motion. Exhibits no edema.  Lymphadenopathy:    No cervical adenopathy.  Neurological: Alert and oriented to person, place, and time. Exhibits normal muscle tone. Gait normal. Coordination normal.  Skin: Skin is warm and dry. No rash noted. Not diaphoretic. No erythema. No pallor.  Psychiatric: Mood, memory and judgment normal.  Vitals reviewed.  LABORATORY DATA: Lab Results  Component Value Date   WBC 5.0 09/30/2020   HGB 11.5 (L) 09/30/2020   HCT 34.7 (L) 09/30/2020   MCV 95.3 09/30/2020   PLT 229 09/30/2020      Chemistry      Component Value Date/Time   NA 139 09/30/2020 1309   K  4.3 09/30/2020 1309   CL 108 09/30/2020 1309   CO2 24 09/30/2020 1309   BUN 19 09/30/2020 1309   CREATININE 1.64 (H) 09/30/2020 1309      Component Value Date/Time   CALCIUM 9.3 09/30/2020 1309   ALKPHOS 101 09/30/2020 1309   AST 22 09/30/2020 1309   ALT 18 09/30/2020 1309   BILITOT 0.3 09/30/2020 1309       RADIOGRAPHIC STUDIES:  No results found.   ASSESSMENT/PLAN:  This is a very pleasant 65 year old African-American male with stage IV non-small cell lung cancer, favored to be adenocarcinoma. He presented with posterior right upper lobe nodule as well as right hilar, infrahilar,  subcarinal, and right paratracheal lymphadenopathy. He also has a 1.4 cm x 1 cm soft tissue nodule in the skin/subcutaneous fat in the left axilla. 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 He was diagnosed in August 2021. Molecular studies by guardant 360 arenegativefor any actionable mutations.  The patient completed SRS to the small metastatic brain lesion under the care of Dr. [name] on 03/18/20  The patient is currently undergoing palliative systemic chemotherapy with carboplatin for an AUC of 5, Alimta 500 mg per metered squared, and Keytruda 200 mg IV every 3 weeks. He is status post 9 cycles. The dose of alimta was reduced to 400 mg/m2 due to renal insufficiency.   Labs were reviewed. His creatinine is 1.63 today. Reviewed with Dr. Julien Nordmann. Recommend that he proceed with cycle 10 today as scheduled with dose reduced Alimta and Keytuda.   I will arrange for a restaging CT scan of the chest, abdomen, and pelvis prior to starting his last cycle of treatment. I will order it without contrast due to his CKD.   We will see him back for a follow up visit in 3 weeks for evaluation and to review his scan before starting cycle #11.   The patient was advised to call immediately if he has any concerning symptoms in the interval. The patient voices understanding of current disease status and treatment options and is in agreement with the current care plan. All questions were answered. The patient knows to call the clinic with any problems, questions or concerns. We can certainly see the patient much sooner if necessary  No orders of the defined types were placed in this encounter.    I spent 20-29 minutes in this encounter.   Cassandra L Heilingoetter, PA-C 10/17/20

## 2020-10-20 ENCOUNTER — Inpatient Hospital Stay: Payer: No Typology Code available for payment source

## 2020-10-20 ENCOUNTER — Other Ambulatory Visit: Payer: Self-pay

## 2020-10-20 ENCOUNTER — Encounter: Payer: Self-pay | Admitting: Physician Assistant

## 2020-10-20 ENCOUNTER — Inpatient Hospital Stay (HOSPITAL_BASED_OUTPATIENT_CLINIC_OR_DEPARTMENT_OTHER): Payer: No Typology Code available for payment source | Admitting: Physician Assistant

## 2020-10-20 VITALS — BP 151/89 | HR 71 | Temp 97.4°F | Resp 18 | Ht 74.0 in | Wt 189.6 lb

## 2020-10-20 DIAGNOSIS — C3411 Malignant neoplasm of upper lobe, right bronchus or lung: Secondary | ICD-10-CM

## 2020-10-20 DIAGNOSIS — Z5112 Encounter for antineoplastic immunotherapy: Secondary | ICD-10-CM

## 2020-10-20 DIAGNOSIS — Z5111 Encounter for antineoplastic chemotherapy: Secondary | ICD-10-CM

## 2020-10-20 LAB — CBC WITH DIFFERENTIAL (CANCER CENTER ONLY)
Abs Immature Granulocytes: 0.01 10*3/uL (ref 0.00–0.07)
Basophils Absolute: 0.1 10*3/uL (ref 0.0–0.1)
Basophils Relative: 1 %
Eosinophils Absolute: 0.1 10*3/uL (ref 0.0–0.5)
Eosinophils Relative: 2 %
HCT: 37 % — ABNORMAL LOW (ref 39.0–52.0)
Hemoglobin: 12.5 g/dL — ABNORMAL LOW (ref 13.0–17.0)
Immature Granulocytes: 0 %
Lymphocytes Relative: 29 %
Lymphs Abs: 1.6 10*3/uL (ref 0.7–4.0)
MCH: 32.1 pg (ref 26.0–34.0)
MCHC: 33.8 g/dL (ref 30.0–36.0)
MCV: 94.9 fL (ref 80.0–100.0)
Monocytes Absolute: 0.6 10*3/uL (ref 0.1–1.0)
Monocytes Relative: 10 %
Neutro Abs: 3.2 10*3/uL (ref 1.7–7.7)
Neutrophils Relative %: 58 %
Platelet Count: 263 10*3/uL (ref 150–400)
RBC: 3.9 MIL/uL — ABNORMAL LOW (ref 4.22–5.81)
RDW: 15.9 % — ABNORMAL HIGH (ref 11.5–15.5)
WBC Count: 5.5 10*3/uL (ref 4.0–10.5)
nRBC: 0 % (ref 0.0–0.2)

## 2020-10-20 LAB — CMP (CANCER CENTER ONLY)
ALT: 18 U/L (ref 0–44)
AST: 19 U/L (ref 15–41)
Albumin: 3.9 g/dL (ref 3.5–5.0)
Alkaline Phosphatase: 108 U/L (ref 38–126)
Anion gap: 11 (ref 5–15)
BUN: 17 mg/dL (ref 8–23)
CO2: 24 mmol/L (ref 22–32)
Calcium: 9.1 mg/dL (ref 8.9–10.3)
Chloride: 105 mmol/L (ref 98–111)
Creatinine: 1.63 mg/dL — ABNORMAL HIGH (ref 0.61–1.24)
GFR, Estimated: 46 mL/min — ABNORMAL LOW (ref >60)
Glucose, Bld: 112 mg/dL — ABNORMAL HIGH (ref 70–99)
Potassium: 4.4 mmol/L (ref 3.5–5.1)
Sodium: 140 mmol/L (ref 135–145)
Total Bilirubin: 0.3 mg/dL (ref 0.3–1.2)
Total Protein: 8.3 g/dL — ABNORMAL HIGH (ref 6.5–8.1)

## 2020-10-20 LAB — TSH: TSH: 1.352 u[IU]/mL (ref 0.320–4.118)

## 2020-10-20 MED ORDER — SODIUM CHLORIDE 0.9 % IV SOLN
Freq: Once | INTRAVENOUS | Status: AC
Start: 2020-10-20 — End: 2020-10-20
  Filled 2020-10-20: qty 250

## 2020-10-20 MED ORDER — SODIUM CHLORIDE 0.9 % IV SOLN
400.0000 mg/m2 | Freq: Once | INTRAVENOUS | Status: AC
  Administered 2020-10-20: 800 mg via INTRAVENOUS
  Filled 2020-10-20: qty 20

## 2020-10-20 MED ORDER — PROCHLORPERAZINE MALEATE 10 MG PO TABS
ORAL_TABLET | ORAL | Status: AC
  Filled 2020-10-20: qty 1

## 2020-10-20 MED ORDER — PROCHLORPERAZINE MALEATE 10 MG PO TABS
10.0000 mg | ORAL_TABLET | Freq: Once | ORAL | Status: AC
  Administered 2020-10-20: 10 mg via ORAL

## 2020-10-20 MED ORDER — SODIUM CHLORIDE 0.9 % IV SOLN
200.0000 mg | Freq: Once | INTRAVENOUS | Status: AC
  Administered 2020-10-20: 200 mg via INTRAVENOUS
  Filled 2020-10-20: qty 8

## 2020-10-20 NOTE — Progress Notes (Signed)
PA states okay to treat with creat 1.63. dose reduced already

## 2020-10-20 NOTE — Patient Instructions (Signed)
[facility] Discharge Instructions for Patients Receiving Chemotherapy  Today you received the following chemotherapy agents Pembrolizumab(Keytruda), Pemetrexed(Alimta).  To help prevent nausea and vomiting after your treatment, we encourage you to take your nausea medication as directed.   If you develop nausea and vomiting that is not controlled by your nausea medication, call the clinic.   BELOW ARE SYMPTOMS THAT SHOULD BE REPORTED IMMEDIATELY:  *FEVER GREATER THAN 100.5 F  *CHILLS WITH OR WITHOUT FEVER  NAUSEA AND VOMITING THAT IS NOT CONTROLLED WITH YOUR NAUSEA MEDICATION  *UNUSUAL SHORTNESS OF BREATH  *UNUSUAL BRUISING OR BLEEDING  TENDERNESS IN MOUTH AND THROAT WITH OR WITHOUT PRESENCE OF ULCERS  *URINARY PROBLEMS  *BOWEL PROBLEMS  UNUSUAL RASH Items with * indicate a potential emergency and should be followed up as soon as possible.  Feel free to call the clinic should you have any questions or concerns. The clinic phone number is [phone].  Please show the [place] at check-in to the Emergency Department and triage nurse.

## 2020-11-02 ENCOUNTER — Telehealth: Payer: Self-pay | Admitting: Physician Assistant

## 2020-11-02 NOTE — Progress Notes (Signed)
[place] [id number]  DIAGNOSIS: Stage IV non-small cell lung cancer,favored to be adenocarcinoma. He presented with posterior right upper lobe nodule as well as right hilar, infrahilar, subcarinal, and right paratracheal lymphadenopathy. He also has a 1.4 cm x 1 cm soft tissue nodule in the skin/subcutaneous fat in the left axilla. 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 He was diagnosed in August 2021  Molecular Biomarkers: BIOMARKER(S)% CFDNA OR AMPLIFICATIONASSOCIATED FDA-APPROVED THERAPIESCLINICAL TRIAL AVAILABILITY [id number] 2.9% None Yes [id number] 0.4% None Yes [id number] 0.2% None Yes  PRIOR THERAPY:  SRS to the small metastatic brain lesion under the care of Dr. [name] on 03/18/20  CURRENT THERAPY:  Palliative systemic chemotherapy with carboplatin for an AUC of 5, Alimta 500 mg/m2, and Keytruda 200 mg IV every 3 weeks. First dose expected on 04/07/20. Status post10cycles.Starting from cycle #5 he will be on maintenance treatment with Alimta and Keytruda every 3 weeks. His dose of Alimta was reduced to 400 mg/m2.Alimta removed starting from cycle #11 due to renal insuffiencey.   INTERVAL HISTORY: Randy Andersen 65 y.o. male returns to the clinic today for a follow up visit. The patient is feeling well today without any concerning complaints. He is tolerating his treatment well without any adverse side effects.   He deniesany signs or symptoms of infection including nasal congestion, sore throat, skin infections, abdominal pain, or dysuria. He denies fevers, chills, recent night sweats, shortness of breath or cough. He denies nausea, vomiting, diarrhea, or constipation.Denies chest pain or hemoptysis.He denies rashes or skin changes. He denies headaches or visual changes. He recently had a restaging CT scan  performed.  He also has an upcoming brain MRI on 11/25/20 due to his history of brain metastases. He is here for evaluation before starting cycle #11.  MEDICAL HISTORY: Past Medical History:  Diagnosis Date  . Asthma    as a child  . Chronic pain disorder    LT leg and foot  . Heart murmur    as a child  . Hypertension   . Malignant neoplasm of upper lobe of right lung ([place])   . Stroke [facility])    mini stroke - 2015, some tingling in fingers on right     ALLERGIES:  has No Known Allergies.  MEDICATIONS:  Current Outpatient Medications  Medication Sig Dispense Refill  . amLODipine (NORVASC) 10 MG tablet Take 10 mg by mouth daily.    Marland Kitchen doxylamine, Sleep, (UNISOM) 25 MG tablet Take 25 mg by mouth at bedtime as needed for sleep.    . folic acid (FOLVITE) 1 MG tablet Take 1 tablet (1 mg total) by mouth daily. 30 tablet 2  . HYDROcodone-acetaminophen (NORCO) 10-325 MG tablet Take 1 tablet by mouth 2 (two) times daily.    . phenazopyridine (PYRIDIUM) 200 MG tablet Take 1 tablet (200 mg total) by mouth 3 (three) times daily. 6 tablet 0  . prochlorperazine (COMPAZINE) 10 MG tablet Take 1 tablet (10 mg total) by mouth every 6 (six) hours as needed. 30 tablet 2  . rivaroxaban (XARELTO) 20 MG TABS tablet TAKE 1 TABLET BY MOUTH ONCE A DAY WITH SUPPER 30 tablet 2  . tamsulosin (FLOMAX) 0.4 MG CAPS capsule Take 1 capsule (0.4 mg total) by mouth daily. 30 capsule 2  . temazepam (RESTORIL) 15 MG capsule Take 1 capsule (15 mg total) by mouth at bedtime as needed for sleep.  Pt is veteran - Call pt to set up delivery 30 capsule 0   No current facility-administered medications for this visit.    SURGICAL HISTORY:  Past Surgical History:  Procedure Laterality Date  . BUNIONECTOMY Left    had hammer toe surgery also and callus removed  . BUNIONECTOMY Right    also had hammer toe surgery and callus removed  . LEG SURGERY Left    PT reports he has pins placed in his Lt leg  . ORIF TIBIA PLATEAU   10/09/2012   Dr Lorin Mercy  . ORIF TIBIA PLATEAU Left 10/08/2012   Procedure: OPEN REDUCTION INTERNAL FIXATION (ORIF) TIBIAL PLATEAU;  Surgeon: Marybelle Killings, MD;  Location: [place];  Service: Orthopedics;  Laterality: Left;  Marland Kitchen VIDEO BRONCHOSCOPY WITH ENDOBRONCHIAL NAVIGATION N/A 02/25/2020   Procedure: VIDEO BRONCHOSCOPY WITH ENDOBRONCHIAL NAVIGATION with biopsies;  Surgeon: Collene Gobble, MD;  Location: [place];  Service: Thoracic;  Laterality: N/A;  . VIDEO BRONCHOSCOPY WITH ENDOBRONCHIAL ULTRASOUND N/A 02/25/2020   Procedure: VIDEO BRONCHOSCOPY WITH ENDOBRONCHIAL ULTRASOUND;  Surgeon: Collene Gobble, MD;  Location[facility];  Service: Thoracic;  Laterality: N/A;    REVIEW OF SYSTEMS:   Review of Systems  Constitutional: Negative for appetite change, chills, fatigue, fever and unexpected weight change.  HENT:   Negative for mouth sores, nosebleeds, sore throat and trouble swallowing.   Eyes: Negative for eye problems and icterus.  Respiratory: Negative for cough, hemoptysis, shortness of breath and wheezing.   Cardiovascular: Negative for chest pain and leg swelling.  Gastrointestinal: Negative for abdominal pain, constipation, diarrhea, nausea and vomiting.  Genitourinary: Negative for bladder incontinence, difficulty urinating, dysuria, frequency and hematuria.   Musculoskeletal: Negative for back pain, gait problem, neck pain and neck stiffness.  Skin: Negative for itching and rash.  Neurological: Negative for dizziness, extremity weakness, gait problem, headaches, light-headedness and seizures.  Hematological: Negative for adenopathy. Does not bruise/bleed easily.  Psychiatric/Behavioral: Negative for confusion, depression and sleep disturbance. The patient is not nervous/anxious.     PHYSICAL EXAMINATION:  There were no vitals taken for this visit.  ECOG PERFORMANCE STATUS: 1 - Symptomatic but completely ambulatory  Physical Exam  Constitutional: Oriented to person, place, and time and  well-developed, well-nourished, and in no distress. No distress.  HENT:  Head: Normocephalic and atraumatic.  Mouth/Throat: Oropharynx is clear and moist. No oropharyngeal exudate.  Eyes: Conjunctivae are normal. Right eye exhibits no discharge. Left eye exhibits no discharge. No scleral icterus.  Neck: Normal range of motion. Neck supple.  Cardiovascular: Normal rate, regular rhythm, normal heart sounds and intact distal pulses.   Pulmonary/Chest: Effort normal and breath sounds normal. No respiratory distress. No wheezes. No rales.  Abdominal: Soft. Bowel sounds are normal. Exhibits no distension and no mass. There is no tenderness.  Musculoskeletal: Normal range of motion. Exhibits no edema.  Lymphadenopathy:    No cervical adenopathy.  Neurological: Alert and oriented to person, place, and time. Exhibits normal muscle tone. Gait normal. Coordination normal.  Skin: Skin is warm and dry. No rash noted. Not diaphoretic. No erythema. No pallor.  Psychiatric: Mood, memory and judgment normal.  Vitals reviewed.  LABORATORY DATA: Lab Results  Component Value Date   WBC 5.5 10/20/2020   HGB 12.5 (L) 10/20/2020   HCT 37.0 (L) 10/20/2020   MCV 94.9 10/20/2020   PLT 263 10/20/2020      Chemistry      Component Value Date/Time   NA 140 10/20/2020 1400   K 4.4  10/20/2020 1400   CL 105 10/20/2020 1400   CO2 24 10/20/2020 1400   BUN 17 10/20/2020 1400   CREATININE 1.63 (H) 10/20/2020 1400      Component Value Date/Time   CALCIUM 9.1 10/20/2020 1400   ALKPHOS 108 10/20/2020 1400   AST 19 10/20/2020 1400   ALT 18 10/20/2020 1400   BILITOT 0.3 10/20/2020 1400       RADIOGRAPHIC STUDIES:  No results found.   ASSESSMENT/PLAN:  This is a very pleasant 65 year old African-American male with stage IV non-small cell lung cancer, favored to be adenocarcinoma. He presented with posterior right upper lobe nodule as well as right hilar, infrahilar, subcarinal, and right paratracheal  lymphadenopathy. He also has a 1.4 cm x 1 cm soft tissue nodule in the skin/subcutaneous fat in the left axilla. 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 He was diagnosed in August 2021. Molecular studies by guardant 360 arenegativefor any actionable mutations.  The patient completed SRS to the small metastatic brain lesion under the care of Dr. [name] on 03/18/20  The patient is currently undergoing palliative systemic chemotherapy with carboplatin for an AUC of 5, Alimta 500 mg per metered squared, and Keytruda 200 mg IV every 3 weeks.He is status post 10 cycles. The dose of alimta was reduced to 400 mg/m2 due to renal insufficiency. Alimta was removed from the treatment plan with cycle #11 (today) due to renal insufficiency.   The patient recently had a restaging CT scan performed.  Dr. Julien Nordmann personally independently reviewed the scan and discussed the results with the patient today.  The scan showed no evidence for disease progression.  Labs were reviewed. His creatinine is 1.65 today. Reviewed with Dr. Julien Nordmann. Recommend thathe proceedwith cycle 11 today as scheduled with single agent Keytruda. Per Dr. Julien Nordmann, we can add alimta back in the future if his creatinine allows.   We will see him back for a follow up visit in 3 weeks for evaluation before starting cycle #12  The patient was advised to call immediately if he has any concerning symptoms in the interval. The patient voices understanding of current disease status and treatment options and is in agreement with the current care plan. All questions were answered. The patient knows to call the clinic with any problems, questions or concerns. We can certainly see the patient much sooner if necessary      No orders of the defined types were placed in this encounter.    Jimie Kuwahara L Jaivian Battaglini, PA-C 11/02/20  ADDENDUM: Hematology/Oncology Attending: I had a face-to-face encounter with the  patient today.  I reviewed his record, scan and recommended his care plan.  This is a very pleasant 65 years old African-American male with stage IV non-small cell lung cancer favoring adenocarcinoma diagnosed in August 2021 with no actionable mutation.  The patient started systemic chemotherapy with induction carboplatin, Alimta and Keytruda for 4 cycles and he is currently on maintenance treatment with Alimta and Keytruda status post 10 cycles.  He has been tolerating this treatment well with no concerning adverse effects. He had repeat CT scan of the chest, abdomen pelvis performed recently.  I personally and independently reviewed the scan and discussed the results with the patient today. His scan showed no concerning findings for disease progression. I recommended for him to continue his maintenance treatment but only with single agent Keytruda at this point because of the renal insufficiency. The patient will come back for follow-up visit in  3 weeks for evaluation before starting cycle #11. He was advised to call immediately if he has any other concerning symptoms in the interval. The total time spent in the appointment was 30 minutes. Disclaimer: This note was dictated with voice recognition software. Similar sounding words can inadvertently be transcribed and may be missed upon review. Eilleen Kempf, MD 11/10/20

## 2020-11-02 NOTE — Telephone Encounter (Signed)
I called the patient to let him know I am supposed to see him next week on 4/20. He is supposed to have a restaging CT scan prior to this visit which has not been scheduled. I called the patient and gave him the number to radiology to schedule his scan. I asked him to schedule his scan before his visit next week on 4/20.

## 2020-11-03 ENCOUNTER — Ambulatory Visit: Payer: No Typology Code available for payment source | Admitting: Internal Medicine

## 2020-11-03 ENCOUNTER — Ambulatory Visit: Payer: No Typology Code available for payment source

## 2020-11-03 ENCOUNTER — Other Ambulatory Visit: Payer: No Typology Code available for payment source

## 2020-11-08 ENCOUNTER — Ambulatory Visit (HOSPITAL_COMMUNITY): Payer: No Typology Code available for payment source

## 2020-11-09 ENCOUNTER — Ambulatory Visit (HOSPITAL_COMMUNITY)
Admission: RE | Admit: 2020-11-09 | Discharge: 2020-11-09 | Disposition: A | Discharge status: Home / Self Care | Payer: No Typology Code available for payment source | Source: Ambulatory Visit | Attending: Physician Assistant | Admitting: Physician Assistant

## 2020-11-09 ENCOUNTER — Other Ambulatory Visit: Payer: Self-pay

## 2020-11-09 ENCOUNTER — Ambulatory Visit (HOSPITAL_COMMUNITY): Payer: No Typology Code available for payment source

## 2020-11-09 DIAGNOSIS — C3411 Malignant neoplasm of upper lobe, right bronchus or lung: Secondary | ICD-10-CM | POA: Insufficient documentation

## 2020-11-09 IMAGING — CT CT CHEST W/O CM
2 of 4 series · 13 of 36 positions shown, 16 images · non-contrast
Comparison: Most recent CT chest, abdomen, and pelvis [DATE].

CLINICAL DATA: Primary Cancer Type: Lung
TECHNIQUE: Multidetector CT imaging of the chest, abdomen and pelvis was
performed following the standard protocol without IV contrast.

[Series 2: cap w/o · axial · non-contrast · 0.93mm/px · z∈[+1008,+1603]mm · 10 of 143 slices shown, 13 images]
[im 12/143  mediastinal]
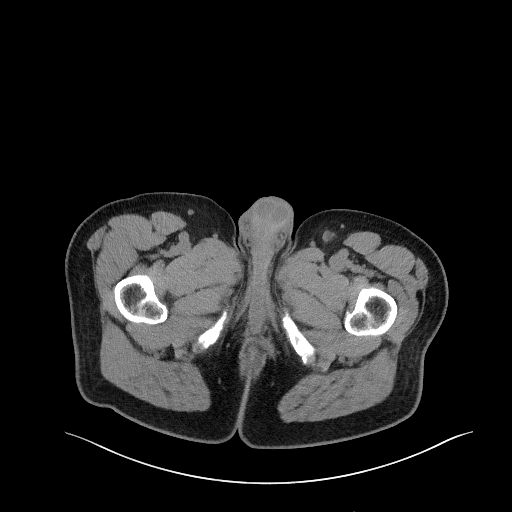
[im 12/143  lung]
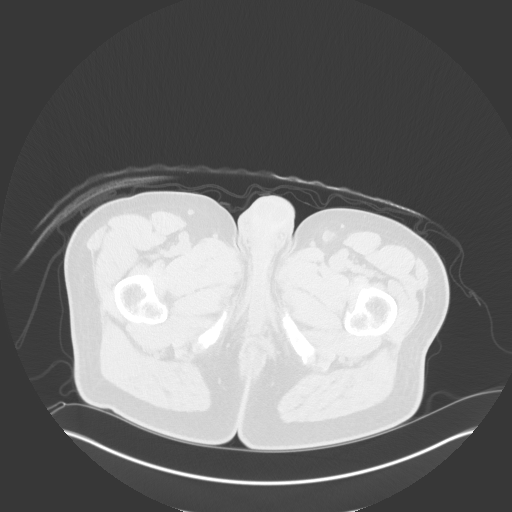
[im 24/143  lung]
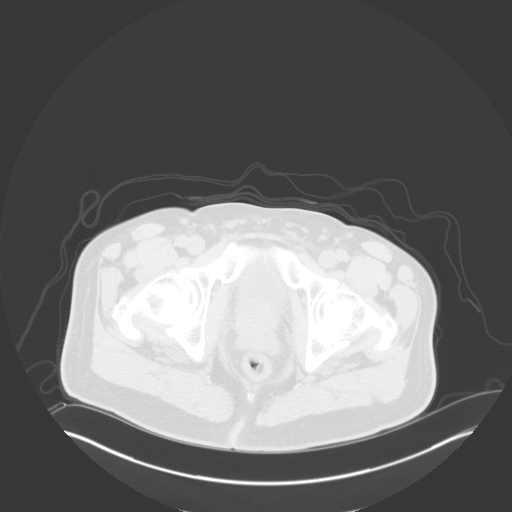
[im 36/143  lung]
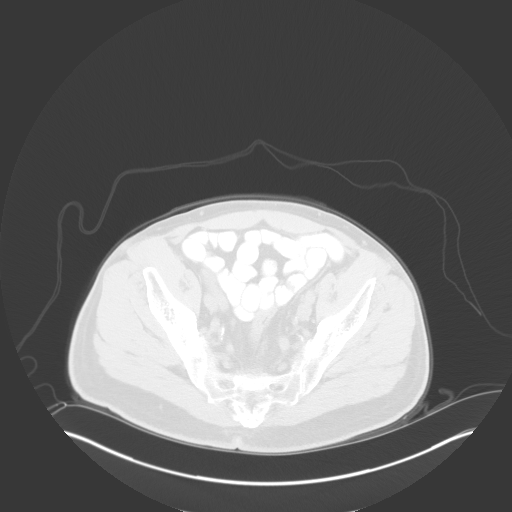
[im 48/143  lung]
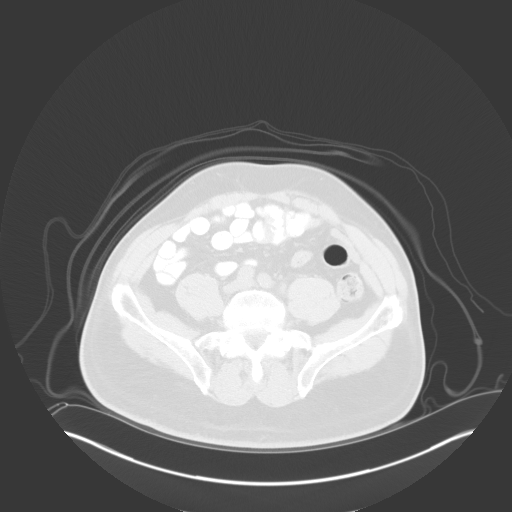
[im 60/143  mediastinal]
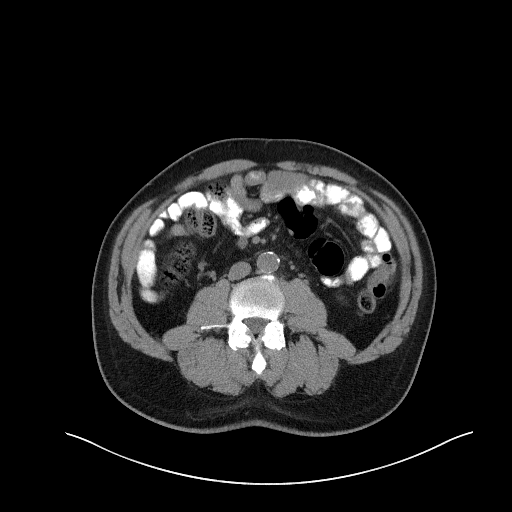
[im 60/143  lung]
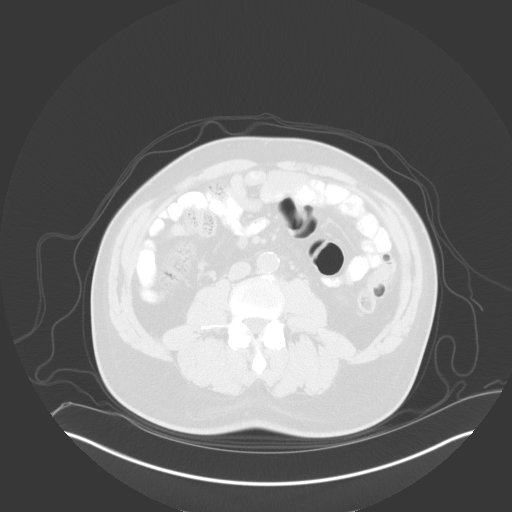
[im 83/143  lung]
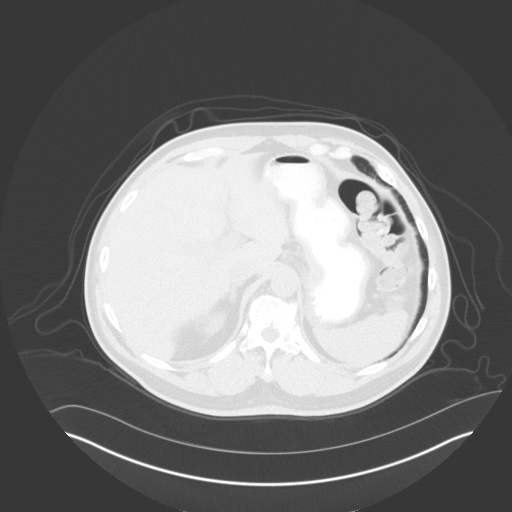
[im 95/143  lung]
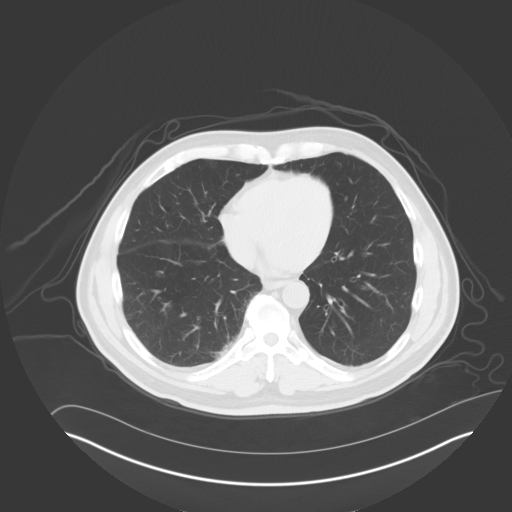
[im 107/143  lung]
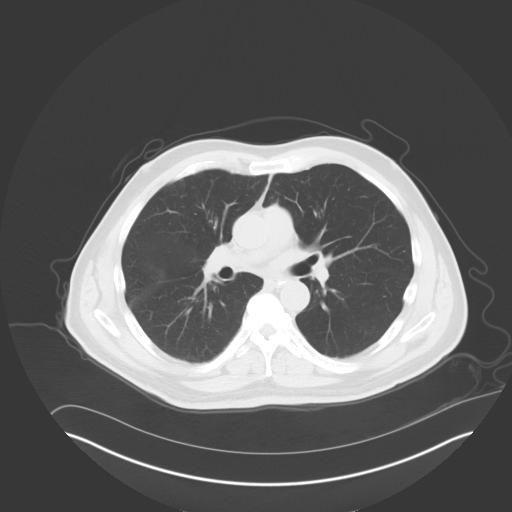
[im 119/143  mediastinal]
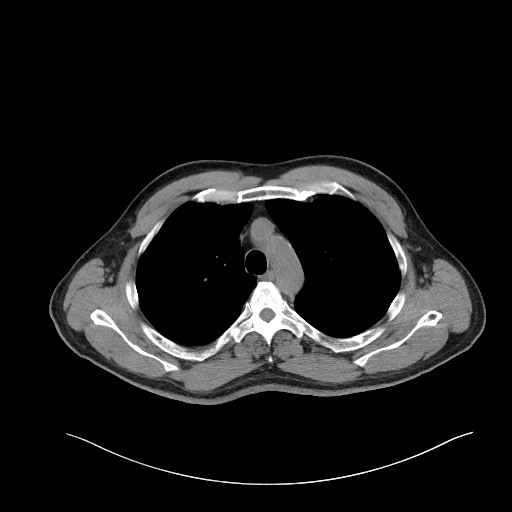
[im 119/143  lung]
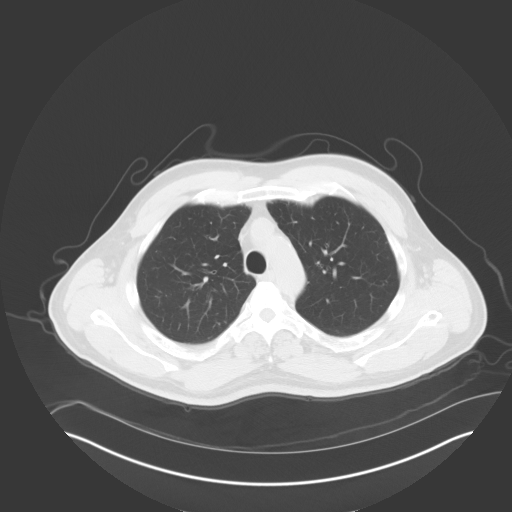
[im 131/143  lung]
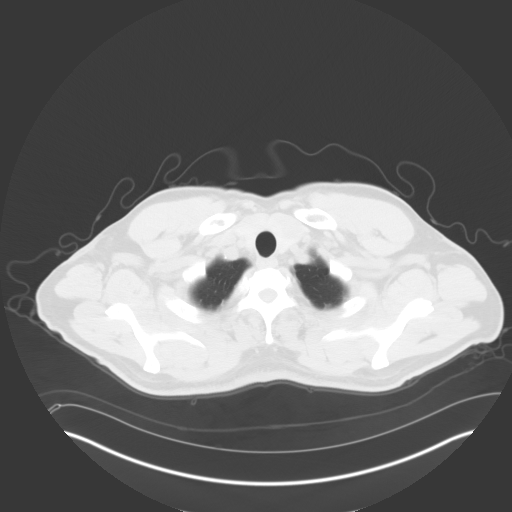

[Series 5: coronals · coronal · 0.95mm/px · 3 of 187 slices shown]
[im 38/187  lung]
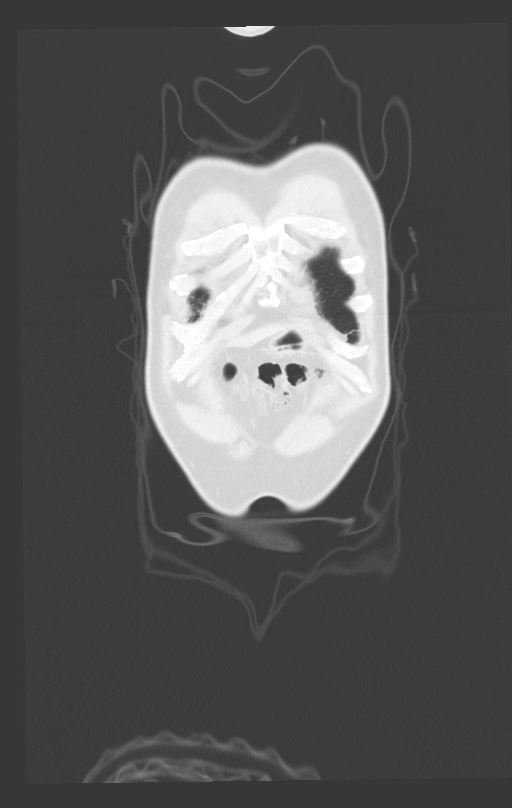
[im 75/187  lung]
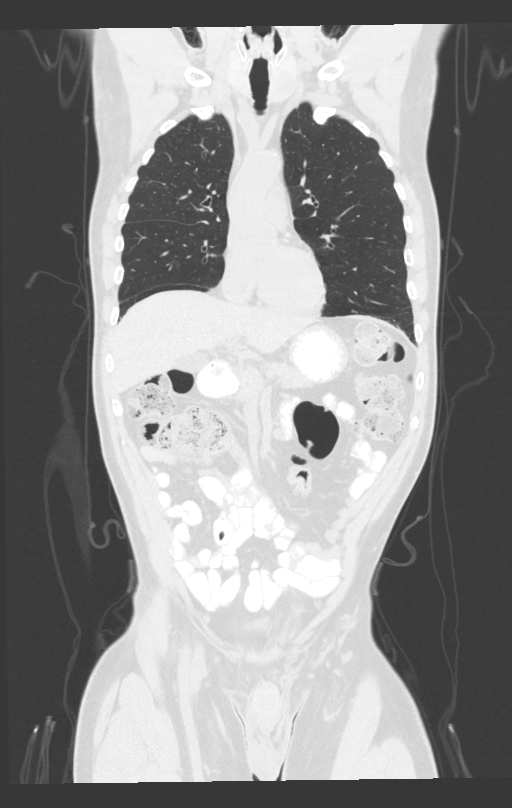
[im 112/187  lung]
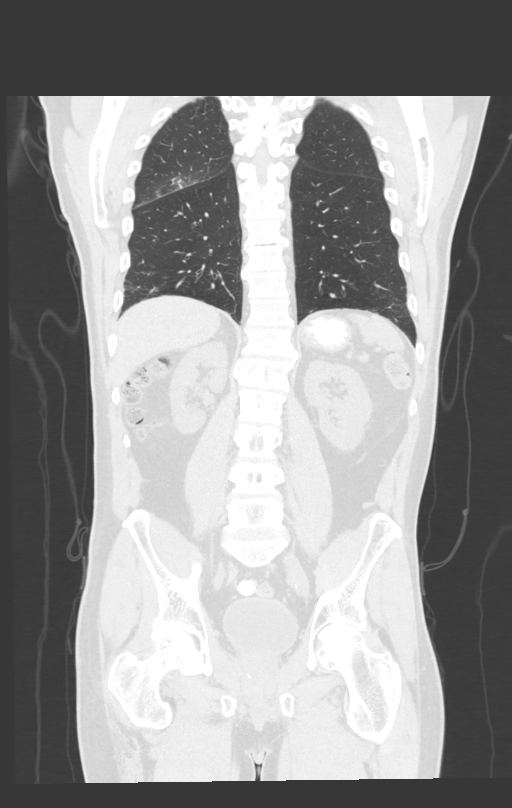

[13 of 36 positions shown; findings below may reference images not displayed]

Imaging Indication: Assess response to therapy

Interval therapy since last imaging? Yes

Initial Cancer Diagnosis

Date: [DATE]; Established by: Biopsy-proven

Detailed Pathology: Stage IV non-small cell lung cancer,
adenocarcinoma.

Primary Tumor location:  Right upper lobe. Metastases to brain.

Surgeries: No.

Chemotherapy: Yes; Ongoing? Yes; Most recent administration:
[DATE]

Immunotherapy?  Yes; Type: Keytruda; Ongoing? Yes

Radiation therapy?  Yes; Date Range: [DATE]; Target: Brain

EXAM:
CT CHEST, ABDOMEN AND PELVIS WITHOUT CONTRAST
FINDINGS: CT CHEST FINDINGS

Cardiovascular: Aortic atherosclerosis and coronary artery
calcifications. Normal heart size.

Mediastinum/Nodes: Unchanged low-attenuation left lobe of thyroid
gland nodule which is been previously evaluated by thyroid sonogram,
image [DATE]. The trachea appears patent and is midline. Normal
appearance of the esophagus. No axillary, mediastinal, or hilar
adenopathy.

Lungs/Pleura: No pleural effusion. No airspace consolidation,
atelectasis or pneumothorax. Spiculated lesion within the posterior
right upper lobe measures 3.2 by 1.9 cm, image 65/4. This appears
unchanged in size when compared with the previous exam. The sub
solid left upper lobe lung nodule is stable measuring 6 mm, image
56/4.

Musculoskeletal: No chest wall mass or suspicious bone lesions
identified.

CT ABDOMEN PELVIS FINDINGS

Hepatobiliary: No focal liver abnormality is seen. No gallstones,
gallbladder wall thickening, or biliary dilatation.

Pancreas: Unremarkable. No pancreatic ductal dilatation or
surrounding inflammatory changes.

Spleen: Normal in size without focal abnormality.

Adrenals/Urinary Tract: Adrenal glands are unremarkable. Kidneys are
normal, without renal calculi, focal lesion, or hydronephrosis.
Bladder is unremarkable.

Stomach/Bowel: Stomach is within normal limits. The appendix is
difficult to identify separate from the right lower quadrant bowel
loops. No secondary signs to suggest acute appendicitis. A moderate
stool burden is noted throughout the colon. No evidence of bowel
wall thickening, distention, or inflammatory changes.

Vascular/Lymphatic: Aortic atherosclerosis. No enlarged abdominal or
pelvic lymph nodes.

Reproductive: Mild prostate gland enlargement.

Other: No abdominal wall hernia or abnormality. No abdominopelvic
ascites.

Musculoskeletal: No acute or significant osseous findings.
IMPRESSION: 1. Stable appearance of posterior right upper lobe spiculated lung
lesion.
2. No signs of metastatic disease.
3. Unchanged sub solid nodule in the left upper lobe. Attention on
follow-up imaging.
4.  Aortic Atherosclerosis ([9B]-[9B]).
5. Coronary artery calcifications.

## 2020-11-09 IMAGING — CT CT ABD-PELV W/O CM
2 of 4 series · 13 of 36 positions shown, 16 images · non-contrast
Comparison: Most recent CT chest, abdomen, and pelvis [DATE].

CLINICAL DATA: Primary Cancer Type: Lung
TECHNIQUE: Multidetector CT imaging of the chest, abdomen and pelvis was
performed following the standard protocol without IV contrast.

[Series 2: cap w/o · axial · non-contrast · 0.93mm/px · z∈[+1008,+1603]mm · 10 of 143 slices shown, 13 images]
[im 12/143  mediastinal]
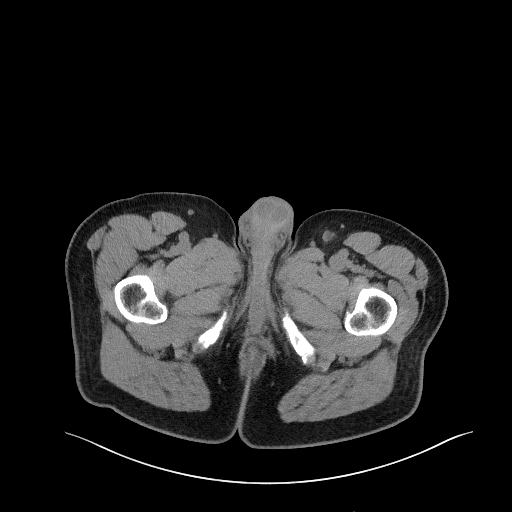
[im 12/143  lung]
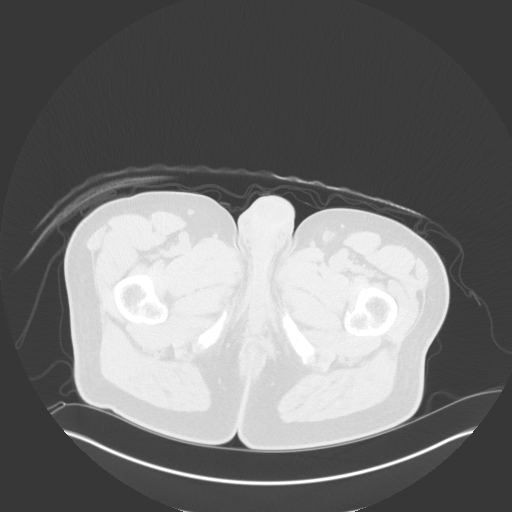
[im 24/143  lung]
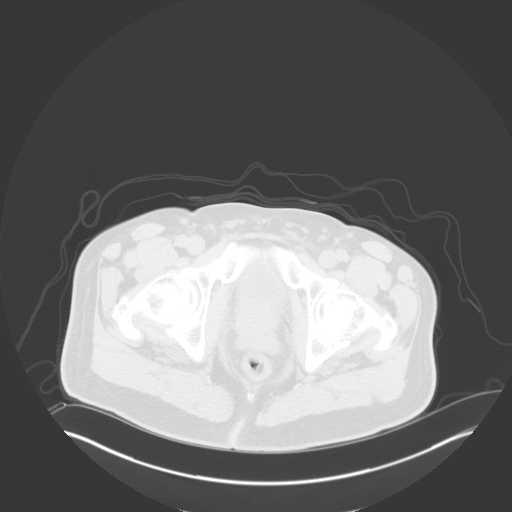
[im 36/143  lung]
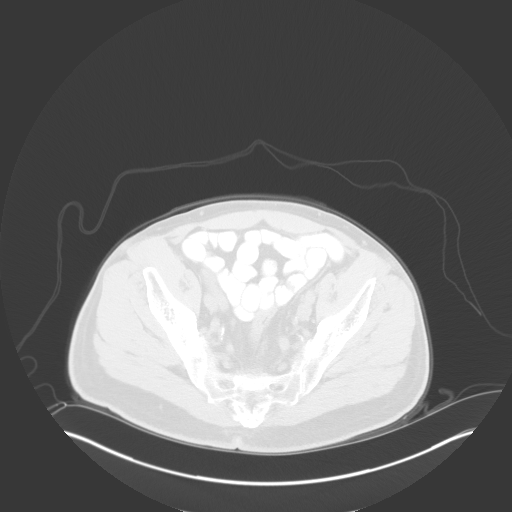
[im 48/143  lung]
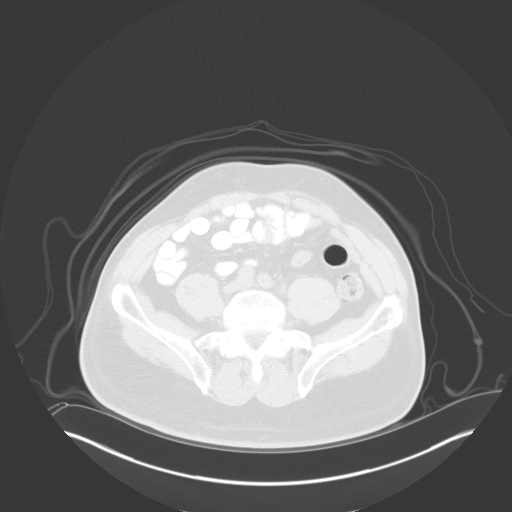
[im 60/143  mediastinal]
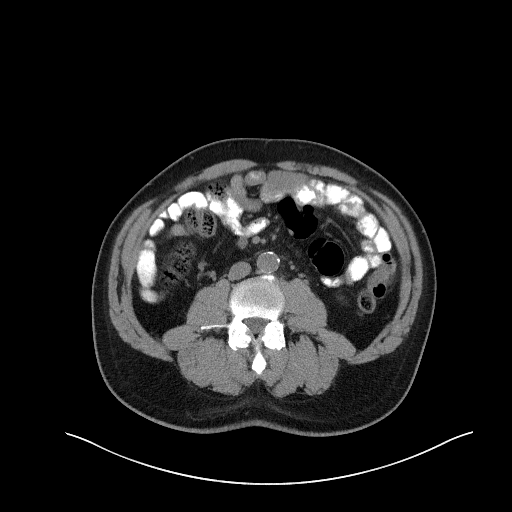
[im 60/143  lung]
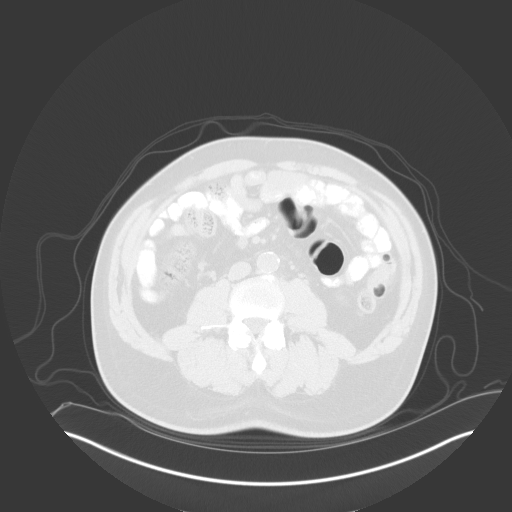
[im 83/143  lung]
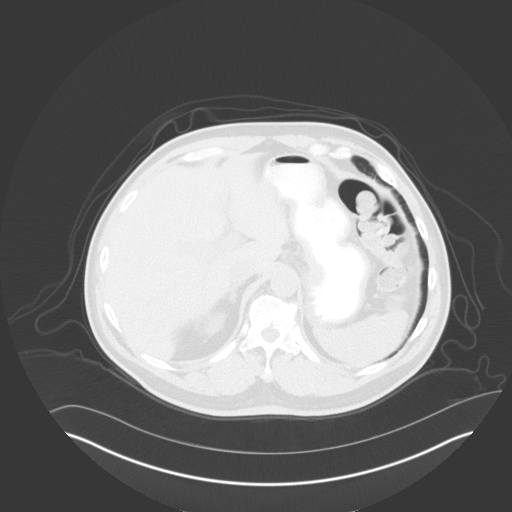
[im 95/143  lung]
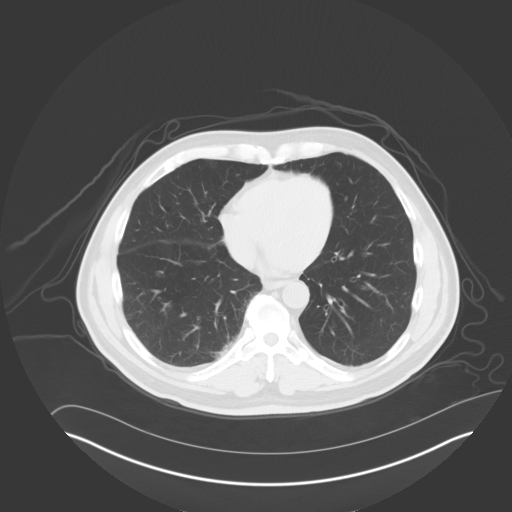
[im 107/143  lung]
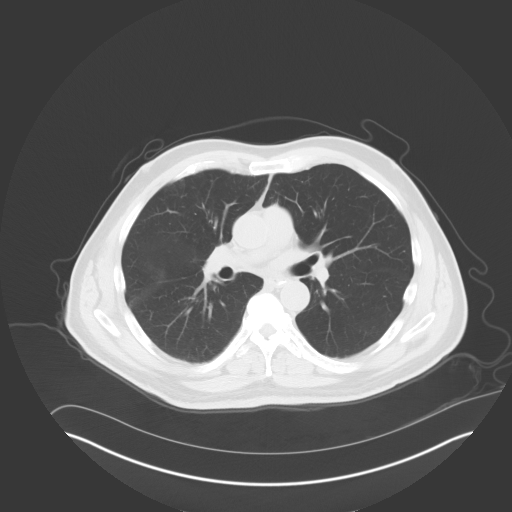
[im 119/143  mediastinal]
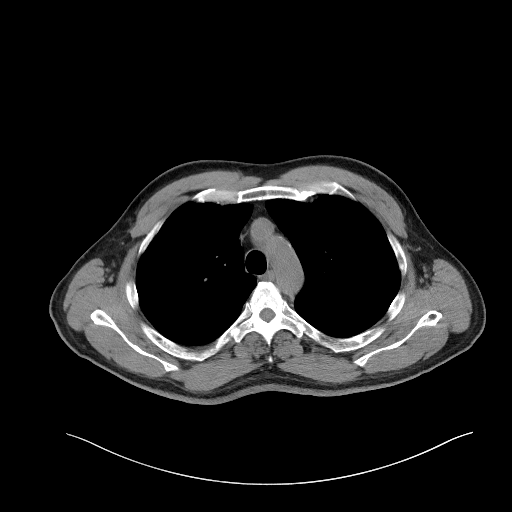
[im 119/143  lung]
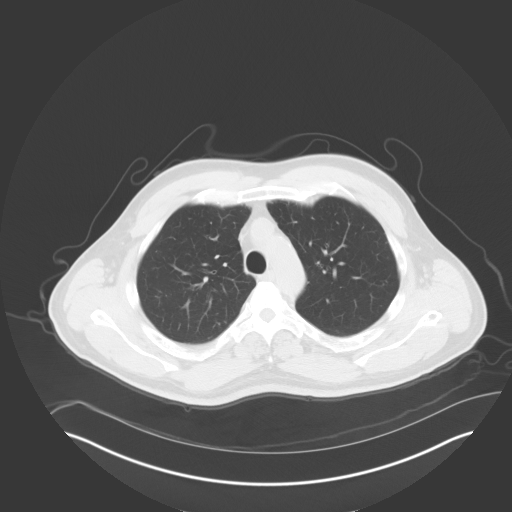
[im 131/143  lung]
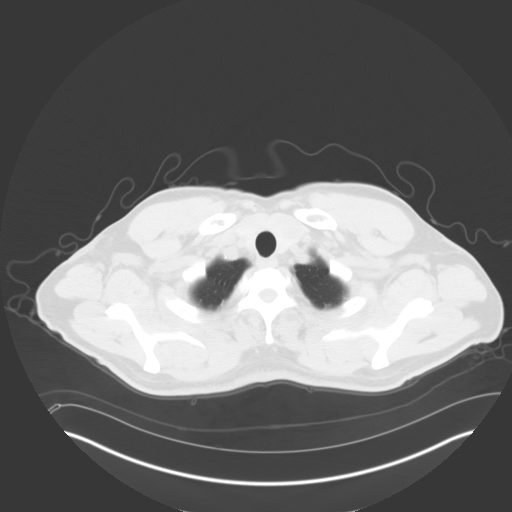

[Series 5: coronals · coronal · 0.95mm/px · 3 of 187 slices shown]
[im 38/187  lung]
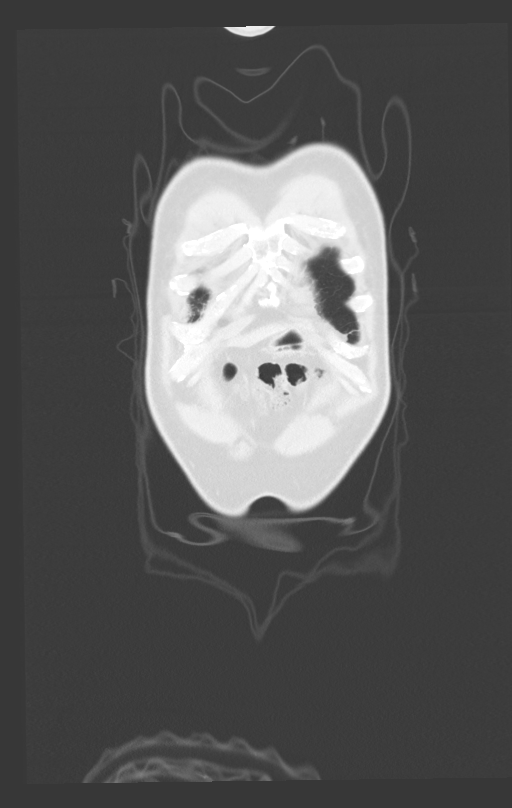
[im 75/187  lung]
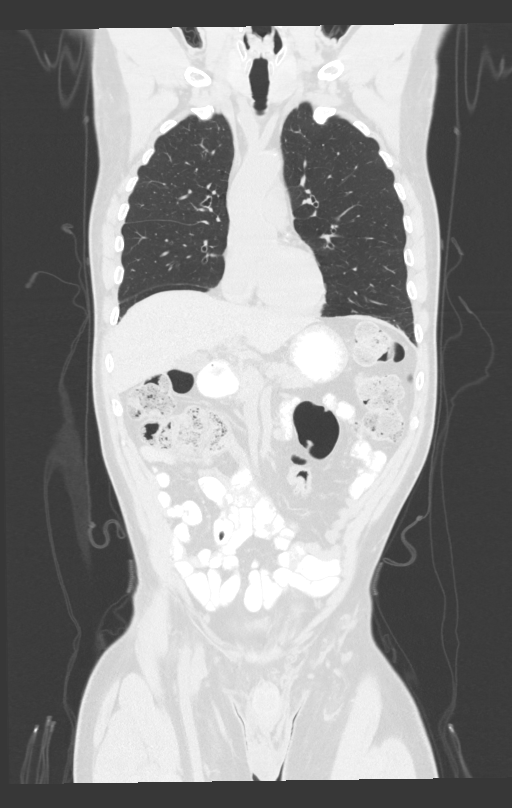
[im 112/187  lung]
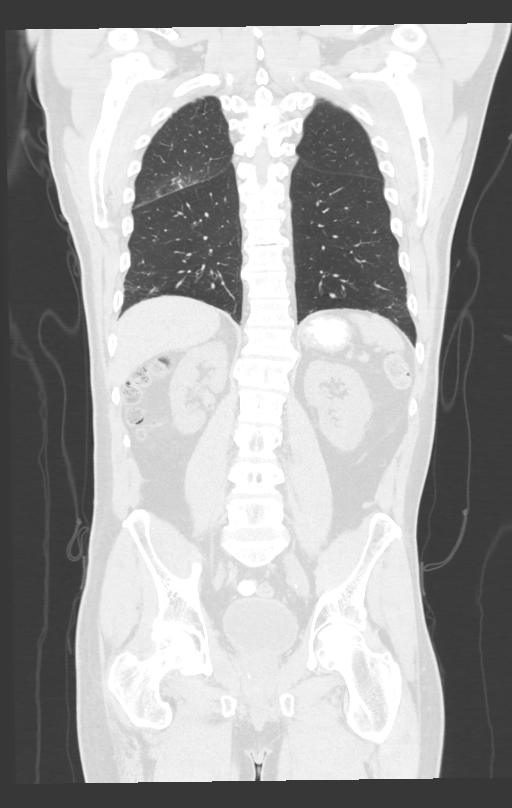

[13 of 36 positions shown; findings below may reference images not displayed]

Imaging Indication: Assess response to therapy

Interval therapy since last imaging? Yes

Initial Cancer Diagnosis

Date: [DATE]; Established by: Biopsy-proven

Detailed Pathology: Stage IV non-small cell lung cancer,
adenocarcinoma.

Primary Tumor location:  Right upper lobe. Metastases to brain.

Surgeries: No.

Chemotherapy: Yes; Ongoing? Yes; Most recent administration:
[DATE]

Immunotherapy?  Yes; Type: Keytruda; Ongoing? Yes

Radiation therapy?  Yes; Date Range: [DATE]; Target: Brain

EXAM:
CT CHEST, ABDOMEN AND PELVIS WITHOUT CONTRAST
FINDINGS: CT CHEST FINDINGS

Cardiovascular: Aortic atherosclerosis and coronary artery
calcifications. Normal heart size.

Mediastinum/Nodes: Unchanged low-attenuation left lobe of thyroid
gland nodule which is been previously evaluated by thyroid sonogram,
image [DATE]. The trachea appears patent and is midline. Normal
appearance of the esophagus. No axillary, mediastinal, or hilar
adenopathy.

Lungs/Pleura: No pleural effusion. No airspace consolidation,
atelectasis or pneumothorax. Spiculated lesion within the posterior
right upper lobe measures 3.2 by 1.9 cm, image 65/4. This appears
unchanged in size when compared with the previous exam. The sub
solid left upper lobe lung nodule is stable measuring 6 mm, image
56/4.

Musculoskeletal: No chest wall mass or suspicious bone lesions
identified.

CT ABDOMEN PELVIS FINDINGS

Hepatobiliary: No focal liver abnormality is seen. No gallstones,
gallbladder wall thickening, or biliary dilatation.

Pancreas: Unremarkable. No pancreatic ductal dilatation or
surrounding inflammatory changes.

Spleen: Normal in size without focal abnormality.

Adrenals/Urinary Tract: Adrenal glands are unremarkable. Kidneys are
normal, without renal calculi, focal lesion, or hydronephrosis.
Bladder is unremarkable.

Stomach/Bowel: Stomach is within normal limits. The appendix is
difficult to identify separate from the right lower quadrant bowel
loops. No secondary signs to suggest acute appendicitis. A moderate
stool burden is noted throughout the colon. No evidence of bowel
wall thickening, distention, or inflammatory changes.

Vascular/Lymphatic: Aortic atherosclerosis. No enlarged abdominal or
pelvic lymph nodes.

Reproductive: Mild prostate gland enlargement.

Other: No abdominal wall hernia or abnormality. No abdominopelvic
ascites.

Musculoskeletal: No acute or significant osseous findings.
IMPRESSION: 1. Stable appearance of posterior right upper lobe spiculated lung
lesion.
2. No signs of metastatic disease.
3. Unchanged sub solid nodule in the left upper lobe. Attention on
follow-up imaging.
4.  Aortic Atherosclerosis ([9B]-[9B]).
5. Coronary artery calcifications.

## 2020-11-10 ENCOUNTER — Inpatient Hospital Stay (HOSPITAL_BASED_OUTPATIENT_CLINIC_OR_DEPARTMENT_OTHER): Payer: No Typology Code available for payment source | Admitting: Physician Assistant

## 2020-11-10 ENCOUNTER — Inpatient Hospital Stay: Payer: No Typology Code available for payment source

## 2020-11-10 ENCOUNTER — Inpatient Hospital Stay: Payer: No Typology Code available for payment source | Attending: Internal Medicine

## 2020-11-10 ENCOUNTER — Other Ambulatory Visit: Payer: Self-pay

## 2020-11-10 VITALS — BP 146/97 | HR 68 | Temp 97.6°F | Resp 16 | Ht 74.0 in | Wt 189.7 lb

## 2020-11-10 DIAGNOSIS — C7931 Secondary malignant neoplasm of brain: Secondary | ICD-10-CM | POA: Diagnosis not present

## 2020-11-10 DIAGNOSIS — C3411 Malignant neoplasm of upper lobe, right bronchus or lung: Secondary | ICD-10-CM | POA: Diagnosis not present

## 2020-11-10 DIAGNOSIS — Z79899 Other long term (current) drug therapy: Secondary | ICD-10-CM | POA: Insufficient documentation

## 2020-11-10 DIAGNOSIS — Z5112 Encounter for antineoplastic immunotherapy: Secondary | ICD-10-CM | POA: Insufficient documentation

## 2020-11-10 LAB — CBC WITH DIFFERENTIAL (CANCER CENTER ONLY)
Abs Immature Granulocytes: 0.02 10*3/uL (ref 0.00–0.07)
Basophils Absolute: 0.1 10*3/uL (ref 0.0–0.1)
Basophils Relative: 1 %
Eosinophils Absolute: 0.1 10*3/uL (ref 0.0–0.5)
Eosinophils Relative: 1 %
HCT: 35.4 % — ABNORMAL LOW (ref 39.0–52.0)
Hemoglobin: 11.9 g/dL — ABNORMAL LOW (ref 13.0–17.0)
Immature Granulocytes: 0 %
Lymphocytes Relative: 23 %
Lymphs Abs: 1.3 10*3/uL (ref 0.7–4.0)
MCH: 32.1 pg (ref 26.0–34.0)
MCHC: 33.6 g/dL (ref 30.0–36.0)
MCV: 95.4 fL (ref 80.0–100.0)
Monocytes Absolute: 0.8 10*3/uL (ref 0.1–1.0)
Monocytes Relative: 14 %
Neutro Abs: 3.4 10*3/uL (ref 1.7–7.7)
Neutrophils Relative %: 61 %
Platelet Count: 239 10*3/uL (ref 150–400)
RBC: 3.71 MIL/uL — ABNORMAL LOW (ref 4.22–5.81)
RDW: 15.9 % — ABNORMAL HIGH (ref 11.5–15.5)
WBC Count: 5.7 10*3/uL (ref 4.0–10.5)
nRBC: 0 % (ref 0.0–0.2)

## 2020-11-10 LAB — CMP (CANCER CENTER ONLY)
ALT: 14 U/L (ref 0–44)
AST: 18 U/L (ref 15–41)
Albumin: 3.6 g/dL (ref 3.5–5.0)
Alkaline Phosphatase: 103 U/L (ref 38–126)
Anion gap: 9 (ref 5–15)
BUN: 17 mg/dL (ref 8–23)
CO2: 24 mmol/L (ref 22–32)
Calcium: 9.3 mg/dL (ref 8.9–10.3)
Chloride: 107 mmol/L (ref 98–111)
Creatinine: 1.65 mg/dL — ABNORMAL HIGH (ref 0.61–1.24)
GFR, Estimated: 46 mL/min — ABNORMAL LOW (ref >60)
Glucose, Bld: 88 mg/dL (ref 70–99)
Potassium: 4.4 mmol/L (ref 3.5–5.1)
Sodium: 140 mmol/L (ref 135–145)
Total Bilirubin: 0.2 mg/dL — ABNORMAL LOW (ref 0.3–1.2)
Total Protein: 7.9 g/dL (ref 6.5–8.1)

## 2020-11-10 LAB — TSH: TSH: 1.022 u[IU]/mL (ref 0.320–4.118)

## 2020-11-10 MED ORDER — SODIUM CHLORIDE 0.9 % IV SOLN
200.0000 mg | Freq: Once | INTRAVENOUS | Status: AC
  Administered 2020-11-10: 200 mg via INTRAVENOUS
  Filled 2020-11-10: qty 8

## 2020-11-10 MED ORDER — SODIUM CHLORIDE 0.9 % IV SOLN
Freq: Once | INTRAVENOUS | Status: AC
  Filled 2020-11-10: qty 250

## 2020-11-10 NOTE — Progress Notes (Signed)
Okay to treat today with labs for Keytruda per Cassandra L, Heilingoetter, PA. Patient will not be getting Alimta today.

## 2020-11-10 NOTE — Patient Instructions (Signed)
[facility] Discharge Instructions for Patients Receiving Chemotherapy  Today you received the following chemotherapy agents: Beryle Flock  To help prevent nausea and vomiting after your treatment, we encourage you to take your nausea medication as directed.    If you develop nausea and vomiting that is not controlled by your nausea medication, call the clinic.   BELOW ARE SYMPTOMS THAT SHOULD BE REPORTED IMMEDIATELY:  *FEVER GREATER THAN 100.5 F  *CHILLS WITH OR WITHOUT FEVER  NAUSEA AND VOMITING THAT IS NOT CONTROLLED WITH YOUR NAUSEA MEDICATION  *UNUSUAL SHORTNESS OF BREATH  *UNUSUAL BRUISING OR BLEEDING  TENDERNESS IN MOUTH AND THROAT WITH OR WITHOUT PRESENCE OF ULCERS  *URINARY PROBLEMS  *BOWEL PROBLEMS  UNUSUAL RASH Items with * indicate a potential emergency and should be followed up as soon as possible.  Feel free to call the clinic should you have any questions or concerns. The clinic phone number is [phone].  Please show the [facility] at check-in to the Emergency Department and triage nurse.

## 2020-11-23 NOTE — Progress Notes (Signed)
[place] [address]  DIAGNOSIS: Stage IV non-small cell lung cancer,favored to be adenocarcinoma. He presented with posterior right upper lobe nodule as well as right hilar, infrahilar, subcarinal, and right paratracheal lymphadenopathy. He also has a 1.4 cm x 1 cm soft tissue nodule in the skin/subcutaneous fat in the left axilla. 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 He was diagnosed in August 2021  Molecular Biomarkers: BIOMARKER(S)% CFDNA OR AMPLIFICATIONASSOCIATED FDA-APPROVED THERAPIESCLINICAL TRIAL AVAILABILITY [id number] 2.9% None Yes [id number] 0.4% None Yes [id number] 0.2% None Yes  PRIOR THERAPY: SRS to the small metastatic brain lesion under the care of Dr. [name] on 03/18/20  CURRENT THERAPY: Palliative systemic chemotherapy with carboplatin for an AUC of 5, Alimta 500 mg/m2, and Keytruda 200 mg IV every 3 weeks. First dose expected on 04/07/20. Status post11cycles.Starting from cycle #5 he will be on maintenance treatment with Alimta and Keytruda every 3 weeks. His dose of Alimta was reduced to 400 mg/m2.Alimta removed starting from cycle #11 due to renal insuffiencey.   INTERVAL HISTORY: Randy Andersen 65 y.o. male returns to the clinic today for a follow up visit. The patient is feelingwelltoday without any concerning complaints. He is tolerating his treatment well without any adverse side effects. He deniesany signs or symptoms of infection including nasal congestion, sore throat, skin infections, abdominal pain, or dysuria. He denies fevers, chills, recent night sweats, shortness of breath or cough. He denies nausea, vomiting, diarrhea, or constipation.Denies chest pain or hemoptysis.He denies rashes or skin changes. He denies headaches or visual changes. He also had a brain MRI on 11/25/20 due  to his history of brain metastases. He talked to radiation oncology for a telephone visit to review these results. He is here for evaluation before starting cycle #12.  MEDICAL HISTORY: Past Medical History:  Diagnosis Date  . Asthma    as a child  . Chronic pain disorder    LT leg and foot  . Heart murmur    as a child  . Hypertension   . Malignant neoplasm of upper lobe of right lung ([place])   . Stroke [facility])    mini stroke - 2015, some tingling in fingers on right     ALLERGIES:  has No Known Allergies.  MEDICATIONS:  Current Outpatient Medications  Medication Sig Dispense Refill  . amLODipine (NORVASC) 10 MG tablet Take 10 mg by mouth daily.    . Cholecalciferol 100 MCG (4000 UT) TABS Take 2 tablets by mouth daily.    Marland Kitchen doxylamine, Sleep, (UNISOM) 25 MG tablet Take 25 mg by mouth at bedtime as needed for sleep.    . folic acid (FOLVITE) 1 MG tablet Take 1 tablet (1 mg total) by mouth daily. 30 tablet 2  . HYDROcodone-acetaminophen (NORCO) 10-325 MG tablet Take 1 tablet by mouth 2 (two) times daily.    Marland Kitchen oxybutynin (DITROPAN-XL) 10 MG 24 hr tablet Take by mouth.    . phenazopyridine (PYRIDIUM) 200 MG tablet Take 1 tablet (200 mg total) by mouth 3 (three) times daily. 6 tablet 0  . polyethylene glycol powder (GLYCOLAX/MIRALAX) 17 GM/SCOOP powder TAKE 17 GRAMS BY MOUTH DAILY (MIX WITH WATER OR OTHER LIQUID AS INSTRUCTED BEFORE DRINKING)    . prochlorperazine (COMPAZINE) 10 MG tablet Take 1 tablet (10 mg total) by mouth every 6 (six) hours as needed. 30 tablet 2  . rivaroxaban (XARELTO) 20 MG TABS tablet  TAKE 1 TABLET BY MOUTH ONCE A DAY WITH SUPPER 30 tablet 2  . senna-docusate (SENOKOT-S) 8.6-50 MG tablet Take 2 tablets by mouth daily.    . tadalafil (CIALIS) 20 MG tablet Take by mouth.    . tamsulosin (FLOMAX) 0.4 MG CAPS capsule Take 1 capsule (0.4 mg total) by mouth daily. 30 capsule 2  . temazepam (RESTORIL) 15 MG capsule Take 1 capsule (15 mg total) by mouth at bedtime as  needed for sleep. Pt is veteran - Call pt to set up delivery 30 capsule 0   No current facility-administered medications for this visit.    SURGICAL HISTORY:  Past Surgical History:  Procedure Laterality Date  . BUNIONECTOMY Left    had hammer toe surgery also and callus removed  . BUNIONECTOMY Right    also had hammer toe surgery and callus removed  . LEG SURGERY Left    PT reports he has pins placed in his Lt leg  . ORIF TIBIA PLATEAU  10/09/2012   Dr Lorin Mercy  . ORIF TIBIA PLATEAU Left 10/08/2012   Procedure: OPEN REDUCTION INTERNAL FIXATION (ORIF) TIBIAL PLATEAU;  Surgeon: Marybelle Killings, MD;  Location: [facility];  Service: Orthopedics;  Laterality: Left;  Marland Kitchen VIDEO BRONCHOSCOPY WITH ENDOBRONCHIAL NAVIGATION N/A 02/25/2020   Procedure: VIDEO BRONCHOSCOPY WITH ENDOBRONCHIAL NAVIGATION with biopsies;  Surgeon: Collene Gobble, MD;  Location: [facility] [place];  Service: Thoracic;  Laterality: N/A;  . VIDEO BRONCHOSCOPY WITH ENDOBRONCHIAL ULTRASOUND N/A 02/25/2020   Procedure: VIDEO BRONCHOSCOPY WITH ENDOBRONCHIAL ULTRASOUND;  Surgeon: Collene Gobble, MD;  Location: [facility];  Service: Thoracic;  Laterality: N/A;    REVIEW OF SYSTEMS:   Review of Systems  Constitutional: Negative for appetite change, chills, fatigue, fever and unexpected weight change.  HENT: Negative for mouth sores, nosebleeds, sore throat and trouble swallowing.   Eyes: Negative for eye problems and icterus.  Respiratory: Negative for cough, hemoptysis, shortness of breath and wheezing.   Cardiovascular: Negative for chest pain and leg swelling.  Gastrointestinal: Negative for abdominal pain, constipation, diarrhea, nausea and vomiting.  Genitourinary: Negative for bladder incontinence, difficulty urinating, dysuria, frequency and hematuria.   Musculoskeletal: Negative for back pain, gait problem, neck pain and neck stiffness.  Skin: Negative for itching and rash.  Neurological: Negative for dizziness, extremity weakness, gait problem,  headaches, light-headedness and seizures.  Hematological: Negative for adenopathy. Does not bruise/bleed easily.  Psychiatric/Behavioral: Negative for confusion, depression and sleep disturbance. The patient is not nervous/anxious.    PHYSICAL EXAMINATION:  Blood pressure (!) 151/98, pulse 74, temperature (!) 97.2 F (36.2 C), temperature source Tympanic, resp. rate 17, weight 188 lb 8 oz (85.5 kg), SpO2 100 %.  ECOG PERFORMANCE STATUS: 1 - Symptomatic but completely ambulatory  Physical Exam  Constitutional: Oriented to person, place, and time and well-developed, well-nourished, and in no distress.  HENT:  Head: Normocephalic and atraumatic.  Mouth/Throat: Oropharynx is clear and moist. No oropharyngeal exudate.  Eyes: Conjunctivae are normal. Right eye exhibits no discharge. Left eye exhibits no discharge. No scleral icterus.  Neck: Normal range of motion. Neck supple.  Cardiovascular: Normal rate, regular rhythm, normal heart sounds and intact distal pulses.   Pulmonary/Chest: Effort normal and breath sounds normal. No respiratory distress. No wheezes. No rales.  Abdominal: Soft. Bowel sounds are normal. Exhibits no distension and no mass. There is no tenderness.  Musculoskeletal: Normal range of motion. Exhibits no edema.  Lymphadenopathy:    No cervical adenopathy.  Neurological: Alert and oriented to person, place,  and time. Exhibits normal muscle tone. Gait normal. Coordination normal.  Skin: Skin is warm and dry. No rash noted. Not diaphoretic. No erythema. No pallor.  Psychiatric: Mood, memory and judgment normal.  Vitals reviewed.  LABORATORY DATA: Lab Results  Component Value Date   WBC 5.4 12/01/2020   HGB 12.0 (L) 12/01/2020   HCT 34.9 (L) 12/01/2020   MCV 92.6 12/01/2020   PLT 178 12/01/2020      Chemistry      Component Value Date/Time   NA 139 12/01/2020 1345   K 4.1 12/01/2020 1345   CL 107 12/01/2020 1345   CO2 23 12/01/2020 1345   BUN 26 (H) 12/01/2020  1345   CREATININE 1.90 (H) 12/01/2020 1345      Component Value Date/Time   CALCIUM 9.8 12/01/2020 1345   ALKPHOS 115 12/01/2020 1345   AST 19 12/01/2020 1345   ALT 10 12/01/2020 1345   BILITOT 0.4 12/01/2020 1345       RADIOGRAPHIC STUDIES:  CT Abdomen Pelvis Wo Contrast  Result Date: 11/10/2020 CLINICAL DATA:  Primary Cancer Type: Lung Imaging Indication: Assess response to therapy Interval therapy since last imaging? Yes Initial Cancer Diagnosis Date: 02/25/2020; Established by: Biopsy-proven Detailed Pathology: Stage IV non-small cell lung cancer, adenocarcinoma. 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 Surgeries: No. Chemotherapy: Yes; Ongoing? Yes; Most recent administration: 10/17/2020 Immunotherapy?  Yes; Type: Keytruda; Ongoing? Yes Radiation therapy?  Yes; Date Range: 03/18/2020; Target: Brain EXAM: CT CHEST, ABDOMEN AND PELVIS WITHOUT CONTRAST TECHNIQUE: Multidetector CT imaging of the chest, abdomen and pelvis was performed following the standard protocol without IV contrast. COMPARISON:  Most recent CT chest, abdomen, and pelvis 08/31/2020. FINDINGS: CT CHEST FINDINGS Cardiovascular: Aortic atherosclerosis and coronary artery calcifications. Normal heart size. Mediastinum/Nodes: Unchanged low-attenuation left lobe of thyroid gland nodule which is been previously evaluated by thyroid sonogram, image 12/2. The trachea appears patent and is midline. Normal appearance of the esophagus. No axillary, mediastinal, or hilar adenopathy. Lungs/Pleura: No pleural effusion. No airspace consolidation, atelectasis or pneumothorax. Spiculated lesion within the posterior right upper lobe measures 3.2 by 1.9 cm, image 65/4. This appears unchanged in size when compared with the previous exam. The sub solid left upper lobe lung nodule is stable measuring 6 mm, image 56/4. Musculoskeletal: No chest wall mass or suspicious bone lesions identified. CT ABDOMEN PELVIS FINDINGS  Hepatobiliary: No focal liver abnormality is seen. No gallstones, gallbladder wall thickening, or biliary dilatation. Pancreas: Unremarkable. No pancreatic ductal dilatation or surrounding inflammatory changes. Spleen: Normal in size without focal abnormality. Adrenals/Urinary Tract: Adrenal glands are unremarkable. Kidneys are normal, without renal calculi, focal lesion, or hydronephrosis. Bladder is unremarkable. Stomach/Bowel: Stomach is within normal limits. The appendix is difficult to identify separate from the right lower quadrant bowel loops. No secondary signs to suggest acute appendicitis. A moderate stool burden is noted throughout the colon. No evidence of bowel wall thickening, distention, or inflammatory changes. Vascular/Lymphatic: Aortic atherosclerosis. No enlarged abdominal or pelvic lymph nodes. Reproductive: Mild prostate gland enlargement. Other: No abdominal wall hernia or abnormality. No abdominopelvic ascites. Musculoskeletal: No acute or significant osseous findings. IMPRESSION: 1. Stable appearance of posterior right upper lobe spiculated lung lesion. 2. No signs of metastatic disease. 3. Unchanged sub solid nodule in the left upper lobe. Attention on follow-up imaging. 4.  Aortic Atherosclerosis (ICD10-I70.0). 5. Coronary artery calcifications. Electronically Signed   By: Kerby Moors M.D.   On: 11/10/2020 10:33   CT Chest Wo Contrast  Result Date:  11/10/2020 CLINICAL DATA:  Primary Cancer Type: Lung Imaging Indication: Assess response to therapy Interval therapy since last imaging? Yes Initial Cancer Diagnosis Date: 02/25/2020; Established by: Biopsy-proven Detailed Pathology: Stage IV non-small cell lung cancer, adenocarcinoma. 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 Surgeries: No. Chemotherapy: Yes; Ongoing? Yes; Most recent administration: 10/17/2020 Immunotherapy?  Yes; Type: Keytruda; Ongoing? Yes Radiation therapy?  Yes; Date Range: 03/18/2020; Target:  Brain EXAM: CT CHEST, ABDOMEN AND PELVIS WITHOUT CONTRAST TECHNIQUE: Multidetector CT imaging of the chest, abdomen and pelvis was performed following the standard protocol without IV contrast. COMPARISON:  Most recent CT chest, abdomen, and pelvis 08/31/2020. FINDINGS: CT CHEST FINDINGS Cardiovascular: Aortic atherosclerosis and coronary artery calcifications. Normal heart size. Mediastinum/Nodes: Unchanged low-attenuation left lobe of thyroid gland nodule which is been previously evaluated by thyroid sonogram, image 12/2. The trachea appears patent and is midline. Normal appearance of the esophagus. No axillary, mediastinal, or hilar adenopathy. Lungs/Pleura: No pleural effusion. No airspace consolidation, atelectasis or pneumothorax. Spiculated lesion within the posterior right upper lobe measures 3.2 by 1.9 cm, image 65/4. This appears unchanged in size when compared with the previous exam. The sub solid left upper lobe lung nodule is stable measuring 6 mm, image 56/4. Musculoskeletal: No chest wall mass or suspicious bone lesions identified. CT ABDOMEN PELVIS FINDINGS Hepatobiliary: No focal liver abnormality is seen. No gallstones, gallbladder wall thickening, or biliary dilatation. Pancreas: Unremarkable. No pancreatic ductal dilatation or surrounding inflammatory changes. Spleen: Normal in size without focal abnormality. Adrenals/Urinary Tract: Adrenal glands are unremarkable. Kidneys are normal, without renal calculi, focal lesion, or hydronephrosis. Bladder is unremarkable. Stomach/Bowel: Stomach is within normal limits. The appendix is difficult to identify separate from the right lower quadrant bowel loops. No secondary signs to suggest acute appendicitis. A moderate stool burden is noted throughout the colon. No evidence of bowel wall thickening, distention, or inflammatory changes. Vascular/Lymphatic: Aortic atherosclerosis. No enlarged abdominal or pelvic lymph nodes. Reproductive: Mild prostate gland  enlargement. Other: No abdominal wall hernia or abnormality. No abdominopelvic ascites. Musculoskeletal: No acute or significant osseous findings. IMPRESSION: 1. Stable appearance of posterior right upper lobe spiculated lung lesion. 2. No signs of metastatic disease. 3. Unchanged sub solid nodule in the left upper lobe. Attention on follow-up imaging. 4.  Aortic Atherosclerosis (ICD10-I70.0). 5. Coronary artery calcifications. Electronically Signed   By: Kerby Moors M.D.   On: 11/10/2020 10:33   MR Brain W Wo Contrast  Result Date: 11/26/2020 CLINICAL DATA:  Lung cancer.  Follow-up treated metastatic disease. EXAM: MRI HEAD WITHOUT AND WITH CONTRAST TECHNIQUE: Multiplanar, multiecho pulse sequences of the brain and surrounding structures were obtained without and with intravenous contrast. CONTRAST:  58mL GADAVIST GADOBUTROL 1 MMOL/ML IV SOLN COMPARISON:  MRI head 08/24/2020, 03/13/2020 FINDINGS: Brain: Treated lesion in the high left posterior frontal lobe no longer enhances. Treated lesion in the right parasagittal parietal lobe shows small area of encephalomalacia. Sagittal images suggest possible minimal residual enhancement. New lesion in the right postcentral gyrus on axial image 203. This measures approximately 2 mm and could be an early metastasis. Continued follow-up recommended. Chronic infarct in the left frontal white matter. Chronic infarct in the right basal ganglia unchanged. No acute infarct. Ventricle size normal. Vascular: Normal arterial flow voids Skull and upper cervical spine: Negative Sinuses/Orbits: Mild mucosal edema paranasal sinuses. Negative orbit Other: None IMPRESSION: Previously treated lesions in the high left frontal lobe and right parasagittal parietal lobe stable New 2 mm lesion in the right postcentral gyrus  may represent early metastatic disease. Close follow-up suggested. Electronically Signed   By: Franchot Gallo M.D.   On: 11/26/2020 14:47     ASSESSMENT/PLAN:   This is a very pleasant 65 year old African-American male with stage IV non-small cell lung cancer, favored to be adenocarcinoma. He presented with posterior right upper lobe nodule as well as right hilar, infrahilar, subcarinal, and right paratracheal lymphadenopathy. He also has a 1.4 cm x 1 cm soft tissue nodule in the skin/subcutaneous fat in the left axilla. 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 He was diagnosed in August 2021. Molecular studies by [facility] arenegativefor any actionable mutations.  The patient completed SRS to the small metastatic brain lesion under the care of Dr. [name] on 03/18/20  The patient is currently undergoing palliative systemic chemotherapy with carboplatin for an AUC of 5, Alimta 500 mg per metered squared, and Keytruda 200 mg IV every 3 weeks.He is status post11cycles. The dose of alimta was reduced to 400 mg/m2 due to renal insufficiency. Alimta was removed from the treatment plan with cycle #11 due to renal insufficiency.    Labs were reviewed. His creatinine is 1.90 today. Recommend that he proceed with cycle #12 today as scheduled with single agent immunotherapy with [place].   We will see him back for a follow up visit in 3 weeks for evaluation before starting cycle #13.   He will continue to follow with radiation oncology about recommendations regarding his follow up brain MRIs for his history of metastatic disease to the brain.   The patient was advised to call immediately if she has any concerning symptoms in the interval. The patient voices understanding of current disease status and treatment options and is in agreement with the current care plan. All questions were answered. The patient knows to call the clinic with any problems, questions or concerns. We can certainly see the patient much sooner if necessary         No orders of the defined types were placed in this encounter.    I spent 20-29  minutes in this encounter.   Zayda Angell L Scottlynn Lindell, PA-C 12/01/20

## 2020-11-25 ENCOUNTER — Ambulatory Visit (HOSPITAL_COMMUNITY)
Admission: RE | Admit: 2020-11-25 | Discharge: 2020-11-25 | Disposition: A | Discharge status: Home / Self Care | Payer: No Typology Code available for payment source | Source: Ambulatory Visit | Attending: Radiation Oncology | Admitting: Radiation Oncology

## 2020-11-25 ENCOUNTER — Other Ambulatory Visit: Payer: Self-pay

## 2020-11-25 DIAGNOSIS — C7931 Secondary malignant neoplasm of brain: Secondary | ICD-10-CM | POA: Insufficient documentation

## 2020-11-25 DIAGNOSIS — C7949 Secondary malignant neoplasm of other parts of nervous system: Secondary | ICD-10-CM | POA: Insufficient documentation

## 2020-11-25 IMAGING — MR MR HEAD WO/W CM
12 of 16 series · 24 of 48 positions shown · IV contrast (gadavist)
Comparison: MRI head [DATE], [DATE]

CLINICAL DATA: Lung cancer.  Follow-up treated metastatic disease.

EXAM:
MRI HEAD WITHOUT AND WITH CONTRAST
TECHNIQUE: Multiplanar, multiecho pulse sequences of the brain and surrounding
structures were obtained without and with intravenous contrast.
CONTRAST:  10mL GADAVIST GADOBUTROL 1 MMOL/ML IV SOLN

[Series 2: FLAIR · sagittal · 3.0mm · 0.47mm/px · 1 of 38 slices shown (1 of 2)]
[im 1/38]
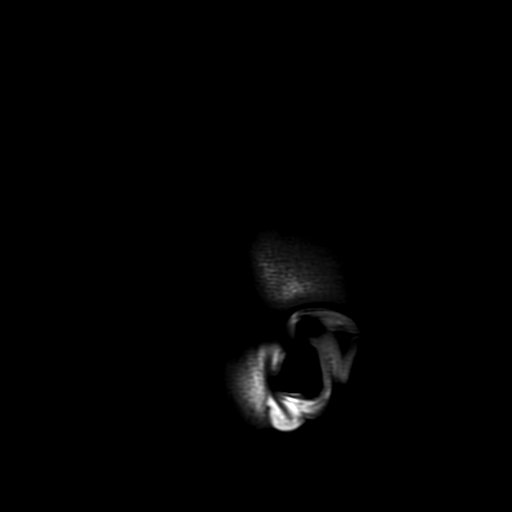

[Series 3: DWI · axial · 3.0mm · 0.94mm/px · z∈[-68,+103]mm · 3 of 116 slices shown (1 of 2)]
[im 1/116]
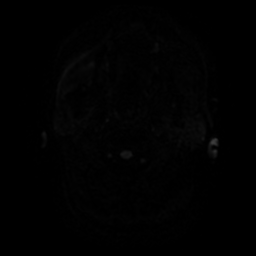
[im 58/116]
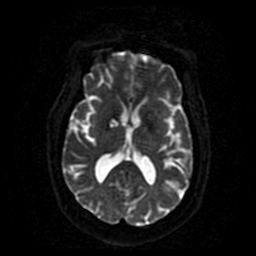
[im 116/116]
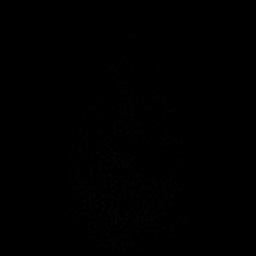

[Series 4: FLAIR · axial · 3.0mm · 0.51mm/px · 1 of 58 slices shown (2 of 2)]
[im 1/58]
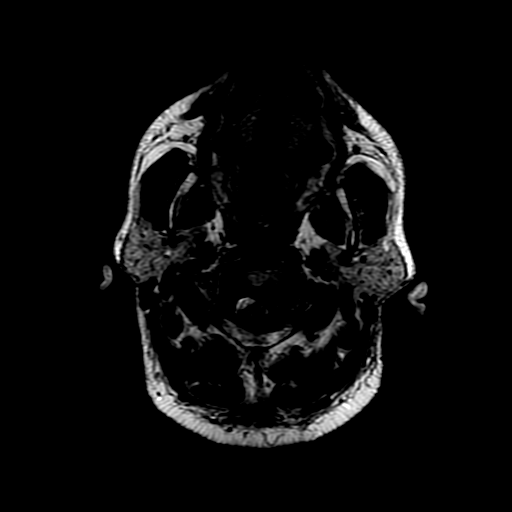

[Series 5: SWI · axial · 3.0mm · 0.47mm/px · z∈[-70,+102]mm · 3 of 116 slices shown]
[im 1/116]
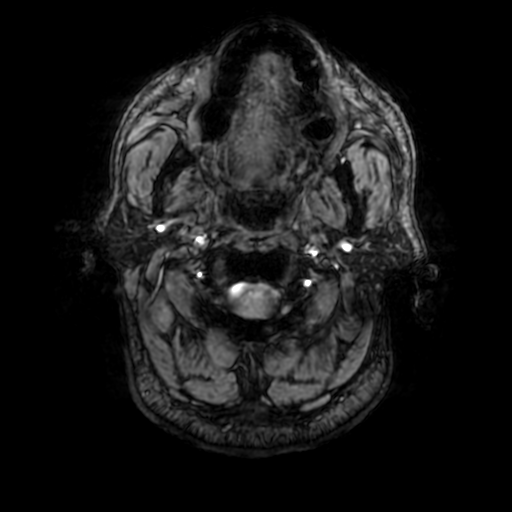
[im 58/116]
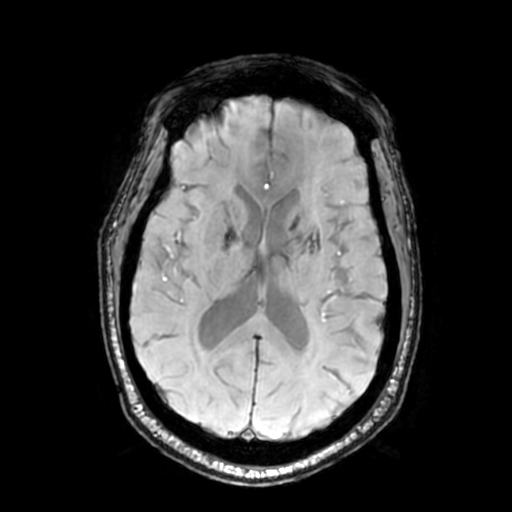
[im 116/116]
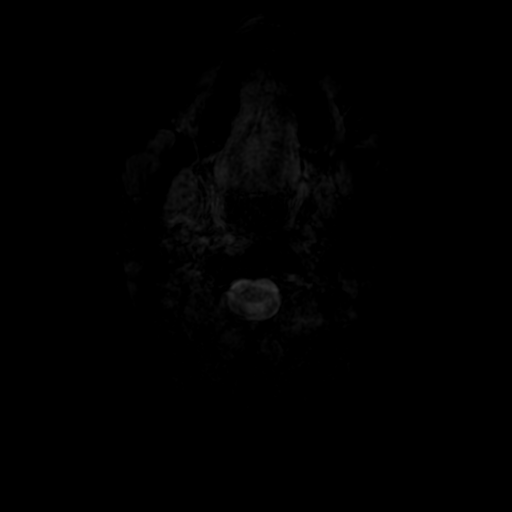

[Series 7: T2 · axial · 5.0mm · 0.47mm/px · 1 of 28 slices shown]
[im 1/28]
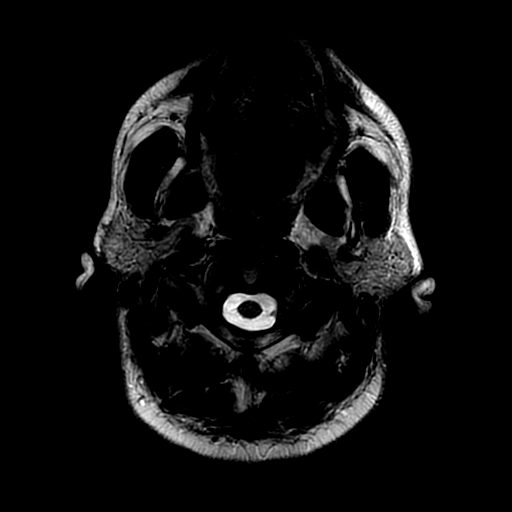

[Series 8: T2 post-contrast · coronal · 3.0mm · 0.39mm/px · 1 of 48 slices shown (1 of 2)]
[im 1/48]
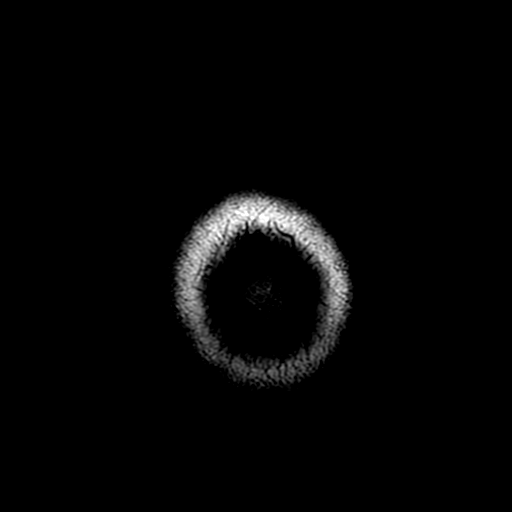

[Series 9: T2 post-contrast · axial · 5.0mm · 0.47mm/px · 1 of 28 slices shown (2 of 2)]
[im 1/28]
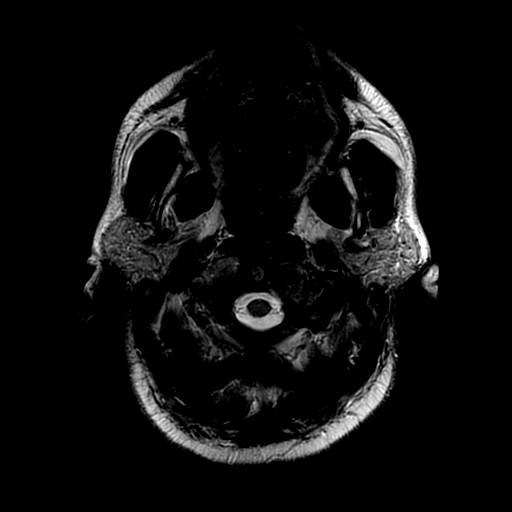

[Series 10: T1 post-contrast · coronal · 3.0mm · 0.43mm/px · 1 of 48 slices shown (1 of 2)]
[im 1/48]
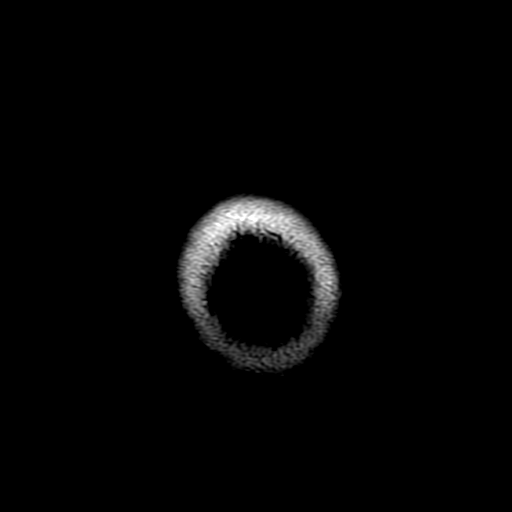

[Series 11: FLAIR post-contrast · sagittal · 3.0mm · 0.47mm/px · 1 of 38 slices shown]
[im 1/38]
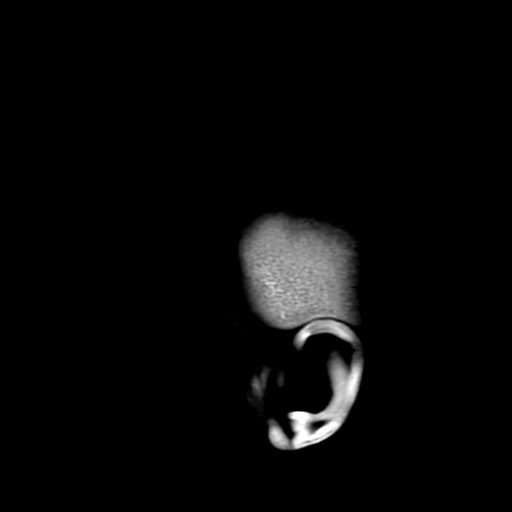

[Series 310: DWI · axial · 3.0mm · 0.94mm/px · z∈[-68,+103]mm · 3 of 116 slices shown (2 of 2)]
[im 1/116]
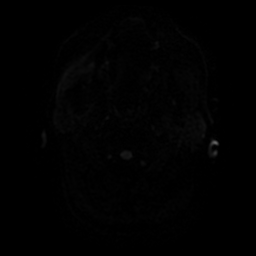
[im 58/116]
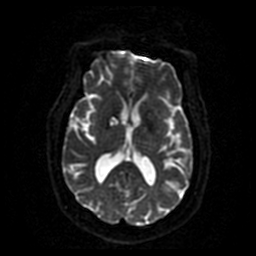
[im 116/116]
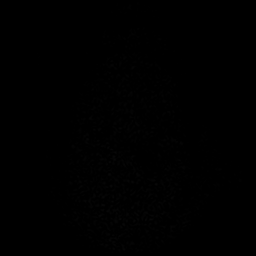

[Series 350: ADC · axial · 3.0mm · 0.94mm/px · 1 of 58 slices shown]
[im 1/58]
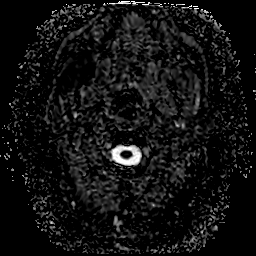

[Series 1200: T1 post-contrast · axial · 0.9mm · 0.50mm/px · z∈[-124,+131]mm · 7 of 300 slices shown (2 of 2)]
[im 1/300]
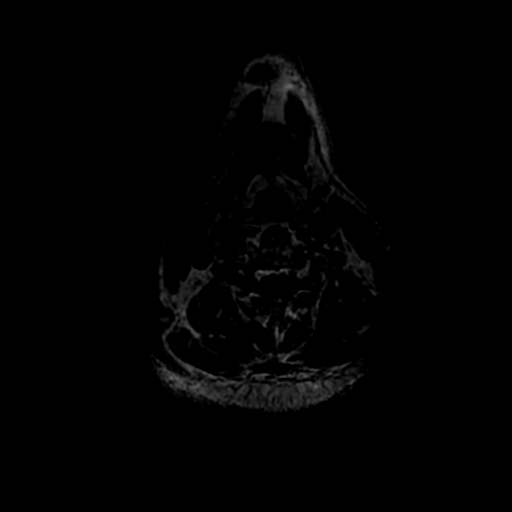
[im 50/300]
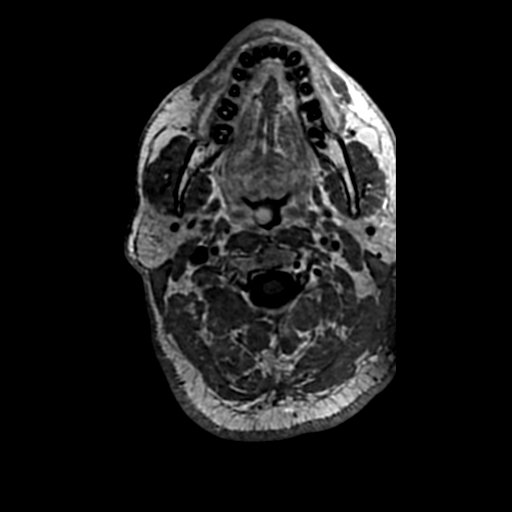
[im 100/300]
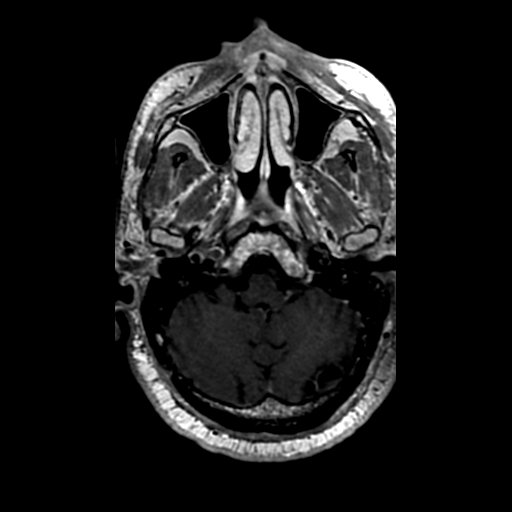
[im 150/300]
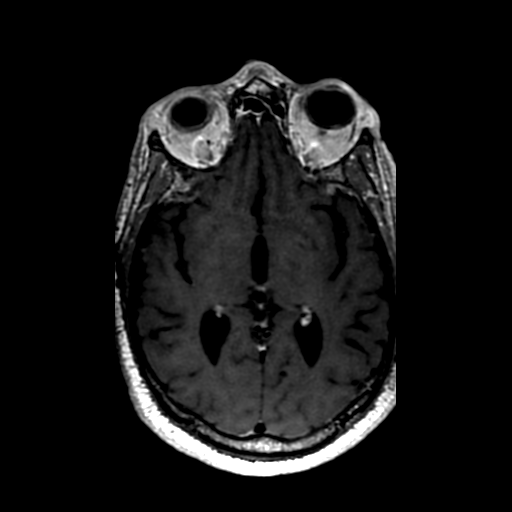
[im 200/300]
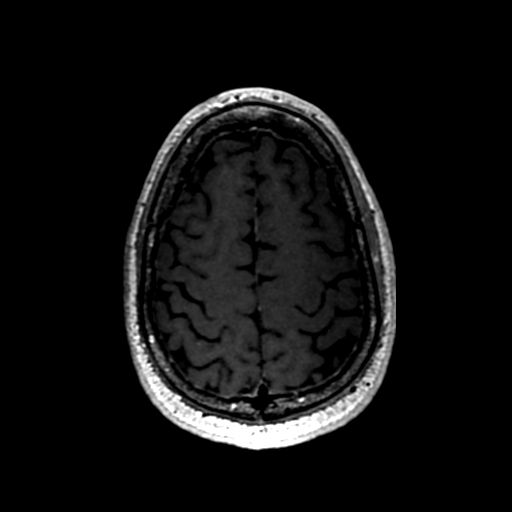
[im 250/300]
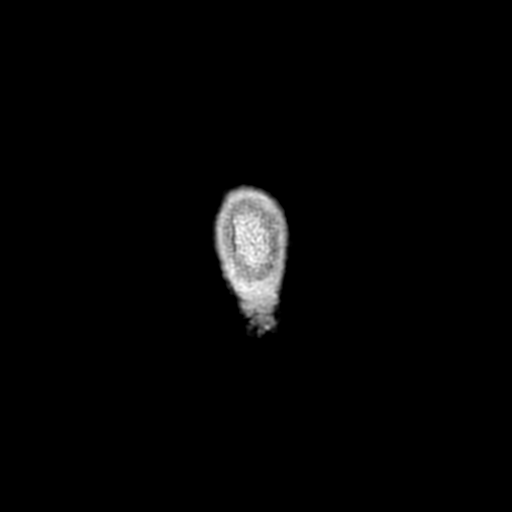
[im 300/300]
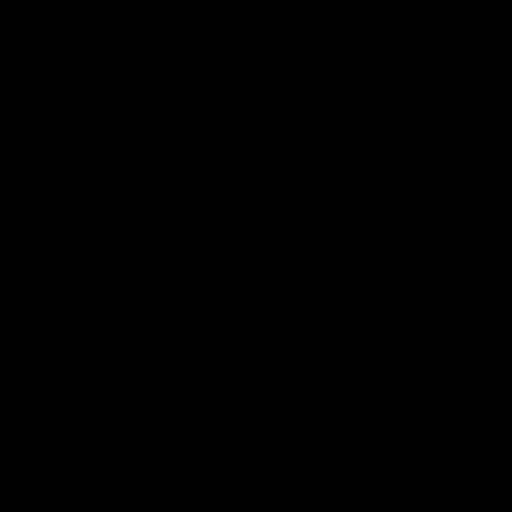

[24 of 48 positions shown; findings below may reference images not displayed]

FINDINGS: Brain: Treated lesion in the high left posterior frontal lobe no
longer enhances.

Treated lesion in the right parasagittal parietal lobe shows small
area of encephalomalacia. Sagittal images suggest possible minimal
residual enhancement.

New lesion in the right postcentral gyrus on axial image 203. This
measures approximately 2 mm and could be an early metastasis.
Continued follow-up recommended.

Chronic infarct in the left frontal white matter. Chronic infarct in
the right basal ganglia unchanged. No acute infarct. Ventricle size
normal.

Vascular: Normal arterial flow voids

Skull and upper cervical spine: Negative

Sinuses/Orbits: Mild mucosal edema paranasal sinuses. Negative orbit

Other: None
IMPRESSION: Previously treated lesions in the high left frontal lobe and right
parasagittal parietal lobe stable

New 2 mm lesion in the right postcentral gyrus may represent early
metastatic disease. Close follow-up suggested.

## 2020-11-25 MED ORDER — GADOBUTROL 1 MMOL/ML IV SOLN
10.0000 mL | Freq: Once | INTRAVENOUS | Status: AC | PRN
  Administered 2020-11-25: 10 mL via INTRAVENOUS

## 2020-11-29 ENCOUNTER — Ambulatory Visit
Admission: RE | Admit: 2020-11-29 | Discharge: 2020-11-29 | Disposition: A | Discharge status: Home / Self Care | Payer: No Typology Code available for payment source | Source: Ambulatory Visit | Attending: Radiation Oncology | Admitting: Radiation Oncology

## 2020-11-29 ENCOUNTER — Inpatient Hospital Stay: Payer: No Typology Code available for payment source | Attending: Internal Medicine

## 2020-11-29 DIAGNOSIS — F17210 Nicotine dependence, cigarettes, uncomplicated: Secondary | ICD-10-CM | POA: Insufficient documentation

## 2020-11-29 DIAGNOSIS — Z5112 Encounter for antineoplastic immunotherapy: Secondary | ICD-10-CM | POA: Insufficient documentation

## 2020-11-29 DIAGNOSIS — C3411 Malignant neoplasm of upper lobe, right bronchus or lung: Secondary | ICD-10-CM | POA: Insufficient documentation

## 2020-11-29 DIAGNOSIS — Z79899 Other long term (current) drug therapy: Secondary | ICD-10-CM | POA: Insufficient documentation

## 2020-11-29 DIAGNOSIS — Z8673 Personal history of transient ischemic attack (TIA), and cerebral infarction without residual deficits: Secondary | ICD-10-CM | POA: Insufficient documentation

## 2020-11-29 DIAGNOSIS — C7931 Secondary malignant neoplasm of brain: Secondary | ICD-10-CM | POA: Insufficient documentation

## 2020-11-29 NOTE — Progress Notes (Signed)
Radiation Oncology         [phone] ________________________________  Outpatient Follow Up - Conducted via telephone due to current COVID-19 concerns for limiting patient exposure  I spoke with the patient to conduct this consult visit via telephone to spare the patient unnecessary potential exposure in the healthcare setting during the current COVID-19 pandemic. The patient was notified in advance and was offered a [place] meeting to allow for face to face communication but unfortunately reported that they did not have the appropriate resources/technology to support such a visit and instead preferred to proceed with a telephone visit.    Name: Randy Andersen        MRN: [id number]  Date of Service: 11/29/2020 DOB: [birth year]  CC:  Meredith Mody, MD     REFERRING PHYSICIAN: Meredith Mody, MD   DIAGNOSIS: The encounter diagnosis was Metastasis to brain [facility]).   HISTORY OF PRESENT ILLNESS: Randy Andersen is a 65 y.o. 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 He received stereotactic radiosurgery to two targets in August 2021. He continues his treatment with immunotherapy with Dr. Julien Nordmann. He had a repeat MRI of the brain on 11/25/20 that showed stability in the two targets in the parietal lobe. He does have a new lesion in the right postcentral gyrus that measured 2 mm and was read by neuroradiology as a possible new metastatic deposit. The patient is contacted today to discuss these results and next steps.      PREVIOUS RADIATION THERAPY:  5 weeks   03/18/20 SRS Treatment:   20 Gy in 1 fraction to 2 targets: PTV1 Rt Parietal 6 mm PTV2 Lt Parietal 6 mm   PAST MEDICAL HISTORY:  Past Medical History:  Diagnosis Date  . Asthma    as a child  . Chronic pain disorder    LT leg and foot  . Heart murmur    as a child  . Hypertension   . Malignant neoplasm of upper lobe of right lung ([place])   . Stroke [facility])    mini stroke - 2015, some  tingling in fingers on right        PAST SURGICAL HISTORY: Past Surgical History:  Procedure Laterality Date  . BUNIONECTOMY Left    had hammer toe surgery also and callus removed  . BUNIONECTOMY Right    also had hammer toe surgery and callus removed  . LEG SURGERY Left    PT reports he has pins placed in his Lt leg  . ORIF TIBIA PLATEAU  10/09/2012   Dr Lorin Mercy  . ORIF TIBIA PLATEAU Left 10/08/2012   Procedure: OPEN REDUCTION INTERNAL FIXATION (ORIF) TIBIAL PLATEAU;  Surgeon: Marybelle Killings, MD;  Location: [place];  Service: Orthopedics;  Laterality: Left;  Marland Kitchen VIDEO BRONCHOSCOPY WITH ENDOBRONCHIAL NAVIGATION N/A 02/25/2020   Procedure: VIDEO BRONCHOSCOPY WITH ENDOBRONCHIAL NAVIGATION with biopsies;  Surgeon: Collene Gobble, MD;  Location: [facility] OR;  Service: Thoracic;  Laterality: N/A;  . VIDEO BRONCHOSCOPY WITH ENDOBRONCHIAL ULTRASOUND N/A 02/25/2020   Procedure: VIDEO BRONCHOSCOPY WITH ENDOBRONCHIAL ULTRASOUND;  Surgeon: Collene Gobble, MD;  Location: [facility] OR;  Service: Thoracic;  Laterality: N/A;     FAMILY HISTORY:  Family History  Problem Relation Age of Onset  . Cancer Mother        Intestinal metastatic to ovaries     SOCIAL HISTORY:  reports that he has been smoking cigarettes. He has a 7.00 pack-year smoking history. He has never used  smokeless tobacco. He reports that he does not drink alcohol and does not use drugs. The patient is single and lives in [place].    ALLERGIES: Patient has no known allergies.   MEDICATIONS:  Current Outpatient Medications  Medication Sig Dispense Refill  . amLODipine (NORVASC) 10 MG tablet Take 10 mg by mouth daily.    . Cholecalciferol 100 MCG (4000 UT) TABS Take 2 tablets by mouth daily.    Marland Kitchen doxylamine, Sleep, (UNISOM) 25 MG tablet Take 25 mg by mouth at bedtime as needed for sleep.    . folic acid (FOLVITE) 1 MG tablet Take 1 tablet (1 mg total) by mouth daily. 30 tablet 2  . HYDROcodone-acetaminophen (NORCO) 10-325 MG tablet Take 1 tablet  by mouth 2 (two) times daily.    Marland Kitchen oxybutynin (DITROPAN-XL) 10 MG 24 hr tablet Take by mouth.    . phenazopyridine (PYRIDIUM) 200 MG tablet Take 1 tablet (200 mg total) by mouth 3 (three) times daily. 6 tablet 0  . polyethylene glycol powder (GLYCOLAX/MIRALAX) 17 GM/SCOOP powder TAKE 17 GRAMS BY MOUTH DAILY (MIX WITH WATER OR OTHER LIQUID AS INSTRUCTED BEFORE DRINKING)    . prochlorperazine (COMPAZINE) 10 MG tablet Take 1 tablet (10 mg total) by mouth every 6 (six) hours as needed. 30 tablet 2  . rivaroxaban (XARELTO) 20 MG TABS tablet TAKE 1 TABLET BY MOUTH ONCE A DAY WITH SUPPER 30 tablet 2  . senna-docusate (SENOKOT-S) 8.6-50 MG tablet Take 2 tablets by mouth daily.    . tadalafil (CIALIS) 20 MG tablet Take by mouth.    . tamsulosin (FLOMAX) 0.4 MG CAPS capsule Take 1 capsule (0.4 mg total) by mouth daily. 30 capsule 2  . temazepam (RESTORIL) 15 MG capsule Take 1 capsule (15 mg total) by mouth at bedtime as needed for sleep. Pt is veteran - Call pt to set up delivery 30 capsule 0   No current facility-administered medications for this encounter.     REVIEW OF SYSTEMS: The patient reports that overall he is doing well. He reports that in the last few months he has had people tell him he is quiet and doesn't engage as much in conversation. He agrees that he has noticed a difficult time formulationg thoughts to then discuss and communicate over with others. He denies difficulty in formulated or choosing the words, but overall in engaging in conversation. He denies headaches, visual or auditory changes, nausea or vomiting, changes in cognitive abilities or movement. No other complaints are verbalized.  PHYSICAL EXAM:  Unable to assess due to encounter type.  ECOG = 0  0 - Asymptomatic (Fully active, able to carry on all predisease activities without restriction)  1 - Symptomatic but completely ambulatory (Restricted in physically strenuous activity but ambulatory and able to carry out work of  a light or sedentary nature. For example, light housework, office work)  2 - Symptomatic, <50% in bed during the day (Ambulatory and capable of all self care but unable to carry out any work activities. Up and about more than 50% of waking hours)  3 - Symptomatic, >50% in bed, but not bedbound (Capable of only limited self-care, confined to bed or chair 50% or more of waking hours)  4 - Bedbound (Completely disabled. Cannot carry on any self-care. Totally confined to bed or chair)  5 - Death   Eustace Pen MM, Creech RH, Tormey DC, et al. [phone]). "Toxicity and response criteria of the [facility] Group". [place] Oncol. 5 (6): [phone]  LABORATORY DATA:  Lab Results  Component Value Date   WBC 5.7 11/10/2020   HGB 11.9 (L) 11/10/2020   HCT 35.4 (L) 11/10/2020   MCV 95.4 11/10/2020   PLT 239 11/10/2020   Lab Results  Component Value Date   NA 140 11/10/2020   K 4.4 11/10/2020   CL 107 11/10/2020   CO2 24 11/10/2020   Lab Results  Component Value Date   ALT 14 11/10/2020   AST 18 11/10/2020   ALKPHOS 103 11/10/2020   BILITOT 0.2 (L) 11/10/2020      RADIOGRAPHY: CT Abdomen Pelvis Wo Contrast  Result Date: 11/10/2020 CLINICAL DATA:  Primary Cancer Type: Lung Imaging Indication: Assess response to therapy Interval therapy since last imaging? Yes Initial Cancer Diagnosis Date: 02/25/2020; Established by: Biopsy-proven Detailed Pathology: Stage IV non-small cell lung cancer, adenocarcinoma. 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 Surgeries: No. Chemotherapy: Yes; Ongoing? Yes; Most recent administration: 10/17/2020 Immunotherapy?  Yes; Type: Keytruda; Ongoing? Yes Radiation therapy?  Yes; Date Range: 03/18/2020; Target: Brain EXAM: CT CHEST, ABDOMEN AND PELVIS WITHOUT CONTRAST TECHNIQUE: Multidetector CT imaging of the chest, abdomen and pelvis was performed following the standard protocol without IV contrast. COMPARISON:  Most recent CT chest,  abdomen, and pelvis 08/31/2020. FINDINGS: CT CHEST FINDINGS Cardiovascular: Aortic atherosclerosis and coronary artery calcifications. Normal heart size. Mediastinum/Nodes: Unchanged low-attenuation left lobe of thyroid gland nodule which is been previously evaluated by thyroid sonogram, image 12/2. The trachea appears patent and is midline. Normal appearance of the esophagus. No axillary, mediastinal, or hilar adenopathy. Lungs/Pleura: No pleural effusion. No airspace consolidation, atelectasis or pneumothorax. Spiculated lesion within the posterior right upper lobe measures 3.2 by 1.9 cm, image 65/4. This appears unchanged in size when compared with the previous exam. The sub solid left upper lobe lung nodule is stable measuring 6 mm, image 56/4. Musculoskeletal: No chest wall mass or suspicious bone lesions identified. CT ABDOMEN PELVIS FINDINGS Hepatobiliary: No focal liver abnormality is seen. No gallstones, gallbladder wall thickening, or biliary dilatation. Pancreas: Unremarkable. No pancreatic ductal dilatation or surrounding inflammatory changes. Spleen: Normal in size without focal abnormality. Adrenals/Urinary Tract: Adrenal glands are unremarkable. Kidneys are normal, without renal calculi, focal lesion, or hydronephrosis. Bladder is unremarkable. Stomach/Bowel: Stomach is within normal limits. The appendix is difficult to identify separate from the right lower quadrant bowel loops. No secondary signs to suggest acute appendicitis. A moderate stool burden is noted throughout the colon. No evidence of bowel wall thickening, distention, or inflammatory changes. Vascular/Lymphatic: Aortic atherosclerosis. No enlarged abdominal or pelvic lymph nodes. Reproductive: Mild prostate gland enlargement. Other: No abdominal wall hernia or abnormality. No abdominopelvic ascites. Musculoskeletal: No acute or significant osseous findings. IMPRESSION: 1. Stable appearance of posterior right upper lobe spiculated lung  lesion. 2. No signs of metastatic disease. 3. Unchanged sub solid nodule in the left upper lobe. Attention on follow-up imaging. 4.  Aortic Atherosclerosis (ICD10-I70.0). 5. Coronary artery calcifications. Electronically Signed   By: Kerby Moors M.D.   On: 11/10/2020 10:33   CT Chest Wo Contrast  Result Date: 11/10/2020 CLINICAL DATA:  Primary Cancer Type: Lung Imaging Indication: Assess response to therapy Interval therapy since last imaging? Yes Initial Cancer Diagnosis Date: 02/25/2020; Established by: Biopsy-proven Detailed Pathology: Stage IV non-small cell lung cancer, adenocarcinoma. 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 Surgeries: No. Chemotherapy: Yes; Ongoing? Yes; Most recent administration: 10/17/2020 Immunotherapy?  Yes; Type: Keytruda; Ongoing? Yes Radiation therapy?  Yes; Date Range: 03/18/2020; Target:  Brain EXAM: CT CHEST, ABDOMEN AND PELVIS WITHOUT CONTRAST TECHNIQUE: Multidetector CT imaging of the chest, abdomen and pelvis was performed following the standard protocol without IV contrast. COMPARISON:  Most recent CT chest, abdomen, and pelvis 08/31/2020. FINDINGS: CT CHEST FINDINGS Cardiovascular: Aortic atherosclerosis and coronary artery calcifications. Normal heart size. Mediastinum/Nodes: Unchanged low-attenuation left lobe of thyroid gland nodule which is been previously evaluated by thyroid sonogram, image 12/2. The trachea appears patent and is midline. Normal appearance of the esophagus. No axillary, mediastinal, or hilar adenopathy. Lungs/Pleura: No pleural effusion. No airspace consolidation, atelectasis or pneumothorax. Spiculated lesion within the posterior right upper lobe measures 3.2 by 1.9 cm, image 65/4. This appears unchanged in size when compared with the previous exam. The sub solid left upper lobe lung nodule is stable measuring 6 mm, image 56/4. Musculoskeletal: No chest wall mass or suspicious bone lesions identified. CT ABDOMEN PELVIS  FINDINGS Hepatobiliary: No focal liver abnormality is seen. No gallstones, gallbladder wall thickening, or biliary dilatation. Pancreas: Unremarkable. No pancreatic ductal dilatation or surrounding inflammatory changes. Spleen: Normal in size without focal abnormality. Adrenals/Urinary Tract: Adrenal glands are unremarkable. Kidneys are normal, without renal calculi, focal lesion, or hydronephrosis. Bladder is unremarkable. Stomach/Bowel: Stomach is within normal limits. The appendix is difficult to identify separate from the right lower quadrant bowel loops. No secondary signs to suggest acute appendicitis. A moderate stool burden is noted throughout the colon. No evidence of bowel wall thickening, distention, or inflammatory changes. Vascular/Lymphatic: Aortic atherosclerosis. No enlarged abdominal or pelvic lymph nodes. Reproductive: Mild prostate gland enlargement. Other: No abdominal wall hernia or abnormality. No abdominopelvic ascites. Musculoskeletal: No acute or significant osseous findings. IMPRESSION: 1. Stable appearance of posterior right upper lobe spiculated lung lesion. 2. No signs of metastatic disease. 3. Unchanged sub solid nodule in the left upper lobe. Attention on follow-up imaging. 4.  Aortic Atherosclerosis (ICD10-I70.0). 5. Coronary artery calcifications. Electronically Signed   By: Kerby Moors M.D.   On: 11/10/2020 10:33   MR Brain W Wo Contrast  Result Date: 11/26/2020 CLINICAL DATA:  Lung cancer.  Follow-up treated metastatic disease. EXAM: MRI HEAD WITHOUT AND WITH CONTRAST TECHNIQUE: Multiplanar, multiecho pulse sequences of the brain and surrounding structures were obtained without and with intravenous contrast. CONTRAST:  77mL GADAVIST GADOBUTROL 1 MMOL/ML IV SOLN COMPARISON:  MRI head 08/24/2020, 03/13/2020 FINDINGS: Brain: Treated lesion in the high left posterior frontal lobe no longer enhances. Treated lesion in the right parasagittal parietal lobe shows small area of  encephalomalacia. Sagittal images suggest possible minimal residual enhancement. New lesion in the right postcentral gyrus on axial image 203. This measures approximately 2 mm and could be an early metastasis. Continued follow-up recommended. Chronic infarct in the left frontal white matter. Chronic infarct in the right basal ganglia unchanged. No acute infarct. Ventricle size normal. Vascular: Normal arterial flow voids Skull and upper cervical spine: Negative Sinuses/Orbits: Mild mucosal edema paranasal sinuses. Negative orbit Other: None IMPRESSION: Previously treated lesions in the high left frontal lobe and right parasagittal parietal lobe stable New 2 mm lesion in the right postcentral gyrus may represent early metastatic disease. Close follow-up suggested. Electronically Signed   By: Franchot Gallo M.D.   On: 11/26/2020 14:47       IMPRESSION/PLAN: 1. 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 The patient is doing well clinically. He will continue his treatment and visits with Dr. Julien Nordmann with immunotherapy consolidation. We did discuss the findings on his MRI and that the new  2 mm lesion in the right postcentral gyrus is indeed new. We did not have brain oncology conference this morning, so Dr. [name] has not had a chance to weigh in on his MRI results. I suspect however that given his history and this new finding that Dr. [name] would either offer a 2 month follow up scan to see if this area is growing and at that time offer [place] treatment, or consider SRS now. The patient is interested in repeating a scan in a shorter interval if Dr. [name] agrees. I will reach out to him tomorrow after speaking with Dr. [name]. 2. Changes in communication. I will discuss this with Dr. [name] and if he thinks a referral to Dr. Mickeal Skinner is appropriate, we will coordinate. I've mentioned this to the patient as well.    Given current concerns for patient exposure during the COVID-19  pandemic, this encounter was conducted via telephone.  The patient has provided two factor identification and has given verbal consent for this type of encounter and has been advised to only accept a meeting of this type in a secure network environment. The time spent during this encounter was 45 minutes including preparation, discussion, and coordination of the patient's care. The attendants for this meeting include Hayden Pedro and Clarene Critchley.  During the encounter, Hayden Pedro was located at [facility] Radiation Oncology Department.  Deland Slocumb was located at home.       Carola Rhine, PAC

## 2020-12-01 ENCOUNTER — Encounter: Payer: Self-pay | Admitting: Physician Assistant

## 2020-12-01 ENCOUNTER — Telehealth: Payer: Self-pay | Admitting: Radiation Oncology

## 2020-12-01 ENCOUNTER — Inpatient Hospital Stay (HOSPITAL_BASED_OUTPATIENT_CLINIC_OR_DEPARTMENT_OTHER): Payer: No Typology Code available for payment source | Admitting: Physician Assistant

## 2020-12-01 ENCOUNTER — Inpatient Hospital Stay: Payer: No Typology Code available for payment source

## 2020-12-01 ENCOUNTER — Other Ambulatory Visit: Payer: Self-pay

## 2020-12-01 VITALS — BP 151/98 | HR 74 | Temp 97.2°F | Resp 17 | Wt 188.5 lb

## 2020-12-01 DIAGNOSIS — F17210 Nicotine dependence, cigarettes, uncomplicated: Secondary | ICD-10-CM | POA: Diagnosis not present

## 2020-12-01 DIAGNOSIS — Z5112 Encounter for antineoplastic immunotherapy: Secondary | ICD-10-CM | POA: Diagnosis not present

## 2020-12-01 DIAGNOSIS — C3411 Malignant neoplasm of upper lobe, right bronchus or lung: Secondary | ICD-10-CM | POA: Diagnosis not present

## 2020-12-01 DIAGNOSIS — Z8673 Personal history of transient ischemic attack (TIA), and cerebral infarction without residual deficits: Secondary | ICD-10-CM | POA: Diagnosis not present

## 2020-12-01 DIAGNOSIS — Z79899 Other long term (current) drug therapy: Secondary | ICD-10-CM | POA: Diagnosis not present

## 2020-12-01 DIAGNOSIS — C7931 Secondary malignant neoplasm of brain: Secondary | ICD-10-CM | POA: Diagnosis not present

## 2020-12-01 LAB — CBC WITH DIFFERENTIAL (CANCER CENTER ONLY)
Abs Immature Granulocytes: 0.01 10*3/uL (ref 0.00–0.07)
Basophils Absolute: 0 10*3/uL (ref 0.0–0.1)
Basophils Relative: 1 %
Eosinophils Absolute: 0.1 10*3/uL (ref 0.0–0.5)
Eosinophils Relative: 2 %
HCT: 34.9 % — ABNORMAL LOW (ref 39.0–52.0)
Hemoglobin: 12 g/dL — ABNORMAL LOW (ref 13.0–17.0)
Immature Granulocytes: 0 %
Lymphocytes Relative: 28 %
Lymphs Abs: 1.5 10*3/uL (ref 0.7–4.0)
MCH: 31.8 pg (ref 26.0–34.0)
MCHC: 34.4 g/dL (ref 30.0–36.0)
MCV: 92.6 fL (ref 80.0–100.0)
Monocytes Absolute: 0.6 10*3/uL (ref 0.1–1.0)
Monocytes Relative: 10 %
Neutro Abs: 3.2 10*3/uL (ref 1.7–7.7)
Neutrophils Relative %: 59 %
Platelet Count: 178 10*3/uL (ref 150–400)
RBC: 3.77 MIL/uL — ABNORMAL LOW (ref 4.22–5.81)
RDW: 14.2 % (ref 11.5–15.5)
WBC Count: 5.4 10*3/uL (ref 4.0–10.5)
nRBC: 0 % (ref 0.0–0.2)

## 2020-12-01 LAB — CMP (CANCER CENTER ONLY)
ALT: 10 U/L (ref 0–44)
AST: 19 U/L (ref 15–41)
Albumin: 4 g/dL (ref 3.5–5.0)
Alkaline Phosphatase: 115 U/L (ref 38–126)
Anion gap: 9 (ref 5–15)
BUN: 26 mg/dL — ABNORMAL HIGH (ref 8–23)
CO2: 23 mmol/L (ref 22–32)
Calcium: 9.8 mg/dL (ref 8.9–10.3)
Chloride: 107 mmol/L (ref 98–111)
Creatinine: 1.9 mg/dL — ABNORMAL HIGH (ref 0.61–1.24)
GFR, Estimated: 39 mL/min — ABNORMAL LOW (ref >60)
Glucose, Bld: 106 mg/dL — ABNORMAL HIGH (ref 70–99)
Potassium: 4.1 mmol/L (ref 3.5–5.1)
Sodium: 139 mmol/L (ref 135–145)
Total Bilirubin: 0.4 mg/dL (ref 0.3–1.2)
Total Protein: 8.2 g/dL — ABNORMAL HIGH (ref 6.5–8.1)

## 2020-12-01 LAB — TSH: TSH: 1.582 u[IU]/mL (ref 0.320–4.118)

## 2020-12-01 MED ORDER — SODIUM CHLORIDE 0.9 % IV SOLN
Freq: Once | INTRAVENOUS | Status: DC
  Filled 2020-12-01: qty 250

## 2020-12-01 MED ORDER — PROCHLORPERAZINE MALEATE 10 MG PO TABS: 10.0000 mg | ORAL_TABLET | Freq: Once | ORAL | Status: DC

## 2020-12-01 MED ORDER — CYANOCOBALAMIN 1000 MCG/ML IJ SOLN: 1000.0000 ug | Freq: Once | INTRAMUSCULAR | Status: DC

## 2020-12-01 MED ORDER — SODIUM CHLORIDE 0.9 % IV SOLN
200.0000 mg | Freq: Once | INTRAVENOUS | Status: AC
  Administered 2020-12-01: 200 mg via INTRAVENOUS
  Filled 2020-12-01: qty 8

## 2020-12-02 NOTE — Telephone Encounter (Signed)
I spoke with the patient, and reviewed that Dr. [name] had also looked through his films from his recent MRI scan and felt that it was appropriate to reimage the patient in 2 months time with a repeat MRI.  The patient is in agreement with this plan, I encouraged him to let us know if he had any other questions about getting this coordinated.  Regarding his difficulty in conversation and some of the symptoms that I think may have something to do with his prior stroke, we will set him up to meet with Dr. Mickeal Skinner.  The patient is quite interested in this as well.  We will coordinate with his scheduler to hopefully have the patient seen in the next week or 2.

## 2020-12-06 ENCOUNTER — Ambulatory Visit: Payer: No Typology Code available for payment source | Admitting: Internal Medicine

## 2020-12-13 ENCOUNTER — Telehealth: Payer: Self-pay | Admitting: Radiation Therapy

## 2020-12-13 ENCOUNTER — Inpatient Hospital Stay: Payer: No Typology Code available for payment source | Admitting: Internal Medicine

## 2020-12-13 NOTE — Telephone Encounter (Signed)
Spoke with Randy Andersen and confirmed the Tuesday 5/31 New pt visit with Dr. Mickeal Skinner. [name] also requested that I mail him an updated schedule since he is not set up for MyChart. As requested, I put a copy of his current appointments through August in the mail for him.   Mont Dutton R.T.(R)(T) Radiation Special Procedures Navigator

## 2020-12-14 ENCOUNTER — Ambulatory Visit: Payer: No Typology Code available for payment source | Admitting: Internal Medicine

## 2020-12-21 ENCOUNTER — Inpatient Hospital Stay (HOSPITAL_BASED_OUTPATIENT_CLINIC_OR_DEPARTMENT_OTHER): Payer: No Typology Code available for payment source | Admitting: Internal Medicine

## 2020-12-21 ENCOUNTER — Other Ambulatory Visit: Payer: Self-pay

## 2020-12-21 VITALS — BP 142/80 | HR 75 | Temp 97.8°F | Resp 18 | Ht 74.0 in | Wt 187.3 lb

## 2020-12-21 DIAGNOSIS — C7931 Secondary malignant neoplasm of brain: Secondary | ICD-10-CM

## 2020-12-21 DIAGNOSIS — Z5112 Encounter for antineoplastic immunotherapy: Secondary | ICD-10-CM | POA: Diagnosis not present

## 2020-12-22 ENCOUNTER — Inpatient Hospital Stay: Payer: No Typology Code available for payment source

## 2020-12-22 ENCOUNTER — Other Ambulatory Visit: Payer: Self-pay | Admitting: Radiation Therapy

## 2020-12-22 ENCOUNTER — Inpatient Hospital Stay: Payer: No Typology Code available for payment source | Attending: Internal Medicine | Admitting: Internal Medicine

## 2020-12-22 ENCOUNTER — Encounter: Payer: Self-pay | Admitting: Internal Medicine

## 2020-12-22 VITALS — BP 141/80 | HR 81 | Temp 97.9°F | Resp 18 | Ht 74.0 in | Wt 187.6 lb

## 2020-12-22 DIAGNOSIS — C3411 Malignant neoplasm of upper lobe, right bronchus or lung: Secondary | ICD-10-CM | POA: Diagnosis not present

## 2020-12-22 DIAGNOSIS — Z5112 Encounter for antineoplastic immunotherapy: Secondary | ICD-10-CM | POA: Diagnosis not present

## 2020-12-22 DIAGNOSIS — C7931 Secondary malignant neoplasm of brain: Secondary | ICD-10-CM | POA: Insufficient documentation

## 2020-12-22 DIAGNOSIS — Z79899 Other long term (current) drug therapy: Secondary | ICD-10-CM | POA: Insufficient documentation

## 2020-12-22 DIAGNOSIS — F17210 Nicotine dependence, cigarettes, uncomplicated: Secondary | ICD-10-CM | POA: Diagnosis not present

## 2020-12-22 LAB — CMP (CANCER CENTER ONLY)
ALT: 9 U/L (ref 0–44)
AST: 14 U/L — ABNORMAL LOW (ref 15–41)
Albumin: 3.5 g/dL (ref 3.5–5.0)
Alkaline Phosphatase: 110 U/L (ref 38–126)
Anion gap: 12 (ref 5–15)
BUN: 18 mg/dL (ref 8–23)
CO2: 21 mmol/L — ABNORMAL LOW (ref 22–32)
Calcium: 9.4 mg/dL (ref 8.9–10.3)
Chloride: 108 mmol/L (ref 98–111)
Creatinine: 1.62 mg/dL — ABNORMAL HIGH (ref 0.61–1.24)
GFR, Estimated: 47 mL/min — ABNORMAL LOW (ref >60)
Glucose, Bld: 126 mg/dL — ABNORMAL HIGH (ref 70–99)
Potassium: 3.7 mmol/L (ref 3.5–5.1)
Sodium: 141 mmol/L (ref 135–145)
Total Bilirubin: 0.4 mg/dL (ref 0.3–1.2)
Total Protein: 7.5 g/dL (ref 6.5–8.1)

## 2020-12-22 LAB — CBC WITH DIFFERENTIAL (CANCER CENTER ONLY)
Abs Immature Granulocytes: 0.01 10*3/uL (ref 0.00–0.07)
Basophils Absolute: 0 10*3/uL (ref 0.0–0.1)
Basophils Relative: 1 %
Eosinophils Absolute: 0.1 10*3/uL (ref 0.0–0.5)
Eosinophils Relative: 2 %
HCT: 37 % — ABNORMAL LOW (ref 39.0–52.0)
Hemoglobin: 12.2 g/dL — ABNORMAL LOW (ref 13.0–17.0)
Immature Granulocytes: 0 %
Lymphocytes Relative: 34 %
Lymphs Abs: 1.7 10*3/uL (ref 0.7–4.0)
MCH: 31 pg (ref 26.0–34.0)
MCHC: 33 g/dL (ref 30.0–36.0)
MCV: 94.1 fL (ref 80.0–100.0)
Monocytes Absolute: 0.5 10*3/uL (ref 0.1–1.0)
Monocytes Relative: 10 %
Neutro Abs: 2.8 10*3/uL (ref 1.7–7.7)
Neutrophils Relative %: 53 %
Platelet Count: 178 10*3/uL (ref 150–400)
RBC: 3.93 MIL/uL — ABNORMAL LOW (ref 4.22–5.81)
RDW: 13.3 % (ref 11.5–15.5)
WBC Count: 5.2 10*3/uL (ref 4.0–10.5)
nRBC: 0 % (ref 0.0–0.2)

## 2020-12-22 LAB — TSH: TSH: 1.422 u[IU]/mL (ref 0.320–4.118)

## 2020-12-22 MED ORDER — SODIUM CHLORIDE 0.9 % IV SOLN
200.0000 mg | Freq: Once | INTRAVENOUS | Status: AC
  Administered 2020-12-22: 200 mg via INTRAVENOUS
  Filled 2020-12-22: qty 8

## 2020-12-22 MED ORDER — SODIUM CHLORIDE 0.9 % IV SOLN
Freq: Once | INTRAVENOUS | Status: AC
  Filled 2020-12-22: qty 250

## 2020-12-22 NOTE — Progress Notes (Signed)
Randy Andersen Telephone:([phone]   Fax:(336) [place] [address] [place] [address]  DIAGNOSIS: Stage IV non-small cell lung cancer, favored to be adenocarcinoma. He presented with posterior right upper lobe nodule as well as right hilar, infrahilar, subcarinal, and right paratracheal lymphadenopathy. He also has a 1.4 cm x 1 cm soft tissue nodule in the skin/subcutaneous fat in the left axilla. 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 He was diagnosed in August 2021  Molecular Biomarkers: BIOMARKER(S)         % CFDNA OR AMPLIFICATION       ASSOCIATED FDA-APPROVED THERAPIES        CLINICAL TRIAL AVAILABILITY [id number] 2.9% None    Yes [id number] 0.4% None    Yes [id number] 0.2% None    Yes  PRIOR THERAPY:  1) SRS to the small metastatic brain lesion under the care of Dr. [name] on 03/18/20  CURRENT THERAPY: Palliative systemic chemotherapy with carboplatin for an AUC of 5, Alimta 500 mg/m2, and Keytruda 200 mg IV every 3 weeks. First dose expected on 04/07/20.  Status post 12 cycles.  Starting from cycle #5 he will be on maintenance treatment with Alimta and Keytruda every 3 weeks.  For the last few cycles he is currently on maintenance treatment with single agent Keytruda.  INTERVAL HISTORY: Randy Andersen 65 y.o. male returns to the clinic today for follow-up visit.  The patient is feeling fine today with no concerning complaints.  Unfortunately he continues to smoke 0.5 pack of cigarette every day.  He denied having any current chest pain, shortness of breath, cough or hemoptysis.  He denied having any fever or chills.  He has no nausea, vomiting, diarrhea or constipation.  The patient is currently on treatment with single agent Keytruda secondary to renal insufficiency and Alimta was discontinued. He is here today for evaluation before starting cycle  #13.    MEDICAL HISTORY: Past Medical History:  Diagnosis Date  . Asthma    as a child  . Chronic pain disorder    LT leg and foot  . Heart murmur    as a child  . Hypertension   . Malignant neoplasm of upper lobe of right lung ([place])   . Stroke [facility])    mini stroke - 2015, some tingling in fingers on right     ALLERGIES:  has No Known Allergies.  MEDICATIONS:  Current Outpatient Medications  Medication Sig Dispense Refill  . amLODipine (NORVASC) 10 MG tablet Take 10 mg by mouth daily.    . Cholecalciferol 100 MCG (4000 UT) TABS Take 2 tablets by mouth daily.    Marland Kitchen doxylamine, Sleep, (UNISOM) 25 MG tablet Take 25 mg by mouth at bedtime as needed for sleep.    . folic acid (FOLVITE) 1 MG tablet Take 1 tablet (1 mg total) by mouth daily. 30 tablet 2  . HYDROcodone-acetaminophen (NORCO) 10-325 MG tablet Take 1 tablet by mouth 2 (two) times daily.    Marland Kitchen oxybutynin (DITROPAN-XL) 10 MG 24 hr tablet Take by mouth.    . phenazopyridine (PYRIDIUM) 200 MG tablet Take 1 tablet (200 mg total) by mouth 3 (three) times daily. 6 tablet 0  . polyethylene glycol powder (GLYCOLAX/MIRALAX) 17 GM/SCOOP powder TAKE 17 GRAMS BY MOUTH DAILY (MIX WITH WATER OR OTHER LIQUID AS INSTRUCTED BEFORE DRINKING)    . prochlorperazine (COMPAZINE) 10 MG tablet Take  1 tablet (10 mg total) by mouth every 6 (six) hours as needed. 30 tablet 2  . rivaroxaban (XARELTO) 20 MG TABS tablet TAKE 1 TABLET BY MOUTH ONCE A DAY WITH SUPPER 30 tablet 2  . senna-docusate (SENOKOT-S) 8.6-50 MG tablet Take 2 tablets by mouth daily.    . tadalafil (CIALIS) 20 MG tablet Take by mouth.    . tamsulosin (FLOMAX) 0.4 MG CAPS capsule Take 1 capsule (0.4 mg total) by mouth daily. 30 capsule 2  . temazepam (RESTORIL) 15 MG capsule Take 1 capsule (15 mg total) by mouth at bedtime as needed for sleep. Pt is veteran - Call pt to set up delivery 30 capsule 0   No current facility-administered medications for this visit.    SURGICAL HISTORY:   Past Surgical History:  Procedure Laterality Date  . BUNIONECTOMY Left    had hammer toe surgery also and callus removed  . BUNIONECTOMY Right    also had hammer toe surgery and callus removed  . LEG SURGERY Left    PT reports he has pins placed in his Lt leg  . ORIF TIBIA PLATEAU  10/09/2012   Dr Lorin Mercy  . ORIF TIBIA PLATEAU Left 10/08/2012   Procedure: OPEN REDUCTION INTERNAL FIXATION (ORIF) TIBIAL PLATEAU;  Surgeon: Marybelle Killings, MD;  Location: [facility];  Service: Orthopedics;  Laterality: Left;  Marland Kitchen VIDEO BRONCHOSCOPY WITH ENDOBRONCHIAL NAVIGATION N/A 02/25/2020   Procedure: VIDEO BRONCHOSCOPY WITH ENDOBRONCHIAL NAVIGATION with biopsies;  Surgeon: Collene Gobble, MD;  Location: [facility];  Service: Thoracic;  Laterality: N/A;  . VIDEO BRONCHOSCOPY WITH ENDOBRONCHIAL ULTRASOUND N/A 02/25/2020   Procedure: VIDEO BRONCHOSCOPY WITH ENDOBRONCHIAL ULTRASOUND;  Surgeon: Collene Gobble, MD;  [facility];  Service: Thoracic;  Laterality: N/A;    REVIEW OF SYSTEMS:  A comprehensive review of systems was negative except for: Constitutional: positive for fatigue   PHYSICAL EXAMINATION: General appearance: alert, cooperative and no distress Head: Normocephalic, without obvious abnormality, atraumatic Neck: no adenopathy, no JVD, supple, symmetrical, trachea midline and thyroid not enlarged, symmetric, no tenderness/mass/nodules Lymph nodes: Cervical, supraclavicular, and axillary nodes normal. Resp: clear to auscultation bilaterally Back: symmetric, no curvature. ROM normal. No CVA tenderness. Cardio: regular rate and rhythm, S1, S2 normal, no murmur, click, rub or gallop GI: soft, non-tender; bowel sounds normal; no masses,  no organomegaly Extremities: extremities normal, atraumatic, no cyanosis or edema  ECOG PERFORMANCE STATUS: 1 - Symptomatic but completely ambulatory  Blood pressure (!) 141/80, pulse 81, temperature 97.9 F (36.6 C), temperature source Tympanic, resp. rate 18, height 6\' 2"   (1.88 m), weight 187 lb 9.6 oz (85.1 kg), SpO2 100 %.  LABORATORY DATA: Lab Results  Component Value Date   WBC 5.2 12/22/2020   HGB 12.2 (L) 12/22/2020   HCT 37.0 (L) 12/22/2020   MCV 94.1 12/22/2020   PLT 178 12/22/2020      Chemistry      Component Value Date/Time   NA 141 12/22/2020 1354   K 3.7 12/22/2020 1354   CL 108 12/22/2020 1354   CO2 21 (L) 12/22/2020 1354   BUN 18 12/22/2020 1354   CREATININE 1.62 (H) 12/22/2020 1354      Component Value Date/Time   CALCIUM 9.4 12/22/2020 1354   ALKPHOS 110 12/22/2020 1354   AST 14 (L) 12/22/2020 1354   ALT 9 12/22/2020 1354   BILITOT 0.4 12/22/2020 1354       RADIOGRAPHIC STUDIES: MR Brain W Wo Contrast  Result Date: 11/26/2020 CLINICAL DATA:  Lung cancer.  Follow-up treated metastatic disease. EXAM: MRI HEAD WITHOUT AND WITH CONTRAST TECHNIQUE: Multiplanar, multiecho pulse sequences of the brain and surrounding structures were obtained without and with intravenous contrast. CONTRAST:  65mL GADAVIST GADOBUTROL 1 MMOL/ML IV SOLN COMPARISON:  MRI head 08/24/2020, 03/13/2020 FINDINGS: Brain: Treated lesion in the high left posterior frontal lobe no longer enhances. Treated lesion in the right parasagittal parietal lobe shows small area of encephalomalacia. Sagittal images suggest possible minimal residual enhancement. New lesion in the right postcentral gyrus on axial image 203. This measures approximately 2 mm and could be an early metastasis. Continued follow-up recommended. Chronic infarct in the left frontal white matter. Chronic infarct in the right basal ganglia unchanged. No acute infarct. Ventricle size normal. Vascular: Normal arterial flow voids Skull and upper cervical spine: Negative Sinuses/Orbits: Mild mucosal edema paranasal sinuses. Negative orbit Other: None IMPRESSION: Previously treated lesions in the high left frontal lobe and right parasagittal parietal lobe stable New 2 mm lesion in the right postcentral gyrus may  represent early metastatic disease. Close follow-up suggested. Electronically Signed   By: Franchot Gallo M.D.   On: 11/26/2020 14:47    ASSESSMENT AND PLAN: This is a very pleasant 65 years old African-American male diagnosed with stage IV non-small cell lung cancer, adenocarcinoma presented with right upper lobe nodule in addition to right hilar, infrahilar, subcarinal and right paratracheal adenopathy in addition to subcutaneous nodules in the left axilla and 2 small metastatic brain lesion diagnosed in August 2021.  The patient has no actionable mutations. He is currently undergoing systemic chemotherapy with carboplatin for AUC of 5, Alimta 500 mg/M2 and Keytruda 200 mg IV every 3 weeks.  Status post 12 cycles.  Starting from cycle #5 he will be on maintenance treatment with Alimta and Keytruda every 3 weeks.  I reduce the dose of his Alimta to 400 mg/M2 secondary to the renal insufficiency. The patient is currently on treatment with single agent Keytruda and Alimta was discontinued secondary to worsening renal insufficiency. He is tolerating his treatment with Keytruda fairly well. I recommended for him to proceed with cycle #13 today as planned. I will see him back for follow-up visit in 3 weeks for evaluation before starting cycle #14. For smoking cessation, I strongly encouraged the patient to quit smoking and work with him on the plan for smoking cessation. The patient was advised to call immediately if he has any other concerning symptoms in the interval. The patient voices understanding of current disease status and treatment options and is in agreement with the current care plan.  All questions were answered. The patient knows to call the clinic with any problems, questions or concerns. We can certainly see the patient much sooner if necessary.   Disclaimer: This note was dictated with voice recognition software. Similar sounding words can inadvertently be transcribed and may not be  corrected upon review.

## 2020-12-22 NOTE — Progress Notes (Signed)
Per Julien Nordmann ,it is okay to treat with Keytruda today  and creatinine of 1.6.

## 2020-12-22 NOTE — Patient Instructions (Signed)
[facility] Discharge Instructions for Patients Receiving Chemotherapy  Today you received the following chemotherapy agents Pembrolizumab(Keytruda),   To help prevent nausea and vomiting after your treatment, we encourage you to take your nausea medication as directed.   If you develop nausea and vomiting that is not controlled by your nausea medication, call the clinic.   BELOW ARE SYMPTOMS THAT SHOULD BE REPORTED IMMEDIATELY:  *FEVER GREATER THAN 100.5 F  *CHILLS WITH OR WITHOUT FEVER  NAUSEA AND VOMITING THAT IS NOT CONTROLLED WITH YOUR NAUSEA MEDICATION  *UNUSUAL SHORTNESS OF BREATH  *UNUSUAL BRUISING OR BLEEDING  TENDERNESS IN MOUTH AND THROAT WITH OR WITHOUT PRESENCE OF ULCERS  *URINARY PROBLEMS  *BOWEL PROBLEMS  UNUSUAL RASH Items with * indicate a potential emergency and should be followed up as soon as possible.  Feel free to call the clinic should you have any questions or concerns. The clinic phone number is [phone].  Please show the [facility] at check-in to the Emergency Department and triage nurse.

## 2020-12-22 NOTE — Progress Notes (Signed)
[facility] at [place], [place] [address] [phone]   New Patient Evaluation  Date of Service: 12/22/20 Patient Name: Randy Andersen Patient MRN: [id number] Patient DOB: January 21, 1956 Provider: Ventura Sellers, MD  Identifying Statement:  Randy Andersen is a 64 y.o. male with Brain metastases [facility]) who presents for initial consultation and evaluation regarding cancer associated neurologic deficits.    Referring Provider: Clinic, [facility] [address] [facility] [place],  [place] [id number]  Primary Cancer:  Oncologic History: Oncology History  Malignant neoplasm of upper lobe of right lung ([place])  02/19/2020 Initial Diagnosis   Malignant neoplasm of upper lobe of right lung ([place])   04/07/2020 -  Chemotherapy    Patient is on Treatment Plan: LUNG CARBOPLATIN / PEMETREXED / PEMBROLIZUMAB Q21D INDUCTION X 4 CYCLES / MAINTENANCE PEMETREXED + PEMBROLIZUMAB       CNS Oncologic History: 03/18/20: SRS to two supratentorial metastases [name])  History of Present Illness: The patient's records from the referring physician were obtained and reviewed and the patient interviewed to confirm this HPI.  Randy Andersen presents for follow up after recent MRI.  He complains of short term memory impairment, cognitive "slowing" since starting chemotherapy last year.  He also describes some difficulty getting words out or finding the correct words at times.  No issues with comprehension.  He is otherwise functional, independent.  He is able to cook, pay the bills, drives himself.  Currently on immuotherapy with Dr. Julien Nordmann.  Does acknowledge stroke ~10 years ago which affected his right sided sensation.   Medications: Current Outpatient Medications on File Prior to Visit  Medication Sig Dispense Refill  . amLODipine (NORVASC) 10 MG tablet Take 10 mg by mouth daily.    . Cholecalciferol 100 MCG (4000 UT) TABS Take 2 tablets by mouth daily.     Marland Kitchen doxylamine, Sleep, (UNISOM) 25 MG tablet Take 25 mg by mouth at bedtime as needed for sleep.    . folic acid (FOLVITE) 1 MG tablet Take 1 tablet (1 mg total) by mouth daily. 30 tablet 2  . HYDROcodone-acetaminophen (NORCO) 10-325 MG tablet Take 1 tablet by mouth 2 (two) times daily.    Marland Kitchen oxybutynin (DITROPAN-XL) 10 MG 24 hr tablet Take by mouth.    . phenazopyridine (PYRIDIUM) 200 MG tablet Take 1 tablet (200 mg total) by mouth 3 (three) times daily. 6 tablet 0  . polyethylene glycol powder (GLYCOLAX/MIRALAX) 17 GM/SCOOP powder TAKE 17 GRAMS BY MOUTH DAILY (MIX WITH WATER OR OTHER LIQUID AS INSTRUCTED BEFORE DRINKING)    . prochlorperazine (COMPAZINE) 10 MG tablet Take 1 tablet (10 mg total) by mouth every 6 (six) hours as needed. 30 tablet 2  . rivaroxaban (XARELTO) 20 MG TABS tablet TAKE 1 TABLET BY MOUTH ONCE A DAY WITH SUPPER 30 tablet 2  . senna-docusate (SENOKOT-S) 8.6-50 MG tablet Take 2 tablets by mouth daily.    . tadalafil (CIALIS) 20 MG tablet Take by mouth.    . tamsulosin (FLOMAX) 0.4 MG CAPS capsule Take 1 capsule (0.4 mg total) by mouth daily. 30 capsule 2  . temazepam (RESTORIL) 15 MG capsule Take 1 capsule (15 mg total) by mouth at bedtime as needed for sleep. Pt is veteran - Call pt to set up delivery 30 capsule 0   No current facility-administered medications on file prior to visit.    Allergies: No Known Allergies Past Medical History:  Past Medical History:  Diagnosis Date  . Asthma  as a child  . Chronic pain disorder    LT leg and foot  . Heart murmur    as a child  . Hypertension   . Malignant neoplasm of upper lobe of right lung ([place])   . Stroke [facility])    mini stroke - 2015, some tingling in fingers on right    Past Surgical History:  Past Surgical History:  Procedure Laterality Date  . BUNIONECTOMY Left    had hammer toe surgery also and callus removed  . BUNIONECTOMY Right    also had hammer toe surgery and callus removed  . LEG SURGERY Left     PT reports he has pins placed in his Lt leg  . ORIF TIBIA PLATEAU  10/09/2012   Dr Lorin Mercy  . ORIF TIBIA PLATEAU Left 10/08/2012   Procedure: OPEN REDUCTION INTERNAL FIXATION (ORIF) TIBIAL PLATEAU;  Surgeon: Marybelle Killings, MD;  Location: [facility];  Service: Orthopedics;  Laterality: Left;  Marland Kitchen VIDEO BRONCHOSCOPY WITH ENDOBRONCHIAL NAVIGATION N/A 02/25/2020   Procedure: VIDEO BRONCHOSCOPY WITH ENDOBRONCHIAL NAVIGATION with biopsies;  Surgeon: Collene Gobble, MD;  [facility];  Service: Thoracic;  Laterality: N/A;  . VIDEO BRONCHOSCOPY WITH ENDOBRONCHIAL ULTRASOUND N/A 02/25/2020   Procedure: VIDEO BRONCHOSCOPY WITH ENDOBRONCHIAL ULTRASOUND;  Surgeon: Collene Gobble, MD;  [facility];  Service: Thoracic;  Laterality: N/A;   Social History:  Social History   Socioeconomic History  . Marital status: Single    Spouse name: Not on file  . Number of children: Not on file  . Years of education: Not on file  . Highest education level: Not on file  Occupational History  . Not on file  Tobacco Use  . Smoking status: Current Every Day Smoker    Packs/day: 0.25    Years: 28.00    Pack years: 7.00    Types: Cigarettes  . Smokeless tobacco: Never Used  Vaping Use  . Vaping Use: Never used  Substance and Sexual Activity  . Alcohol use: No  . Drug use: No  . Sexual activity: Not on file  Other Topics Concern  . Not on file  Social History Narrative  . Not on file   Social Determinants of Health   Financial Resource Strain: Medium Risk  . Difficulty of Paying Living Expenses: Somewhat hard  Food Insecurity: No Food Insecurity  . Worried About Charity fundraiser in the Last Year: Never true  . Ran Out of Food in the Last Year: Never true  Transportation Needs: No Transportation Needs  . Lack of Transportation (Medical): No  . Lack of Transportation (Non-Medical): No  Physical Activity: Insufficiently Active  . Days of Exercise per Week: 3 days  . Minutes of Exercise per Session: 20 min   Stress: Not on file  Social Connections: Socially Isolated  . Frequency of Communication with Friends and Family: Once a week  . Frequency of Social Gatherings with Friends and Family: Once a week  . Attends Religious Services: Never  . Active Member of Clubs or Organizations: No  . Attends Archivist Meetings: Never  . Marital Status: Never married  Intimate Partner Violence: Not on file   Family History:  Family History  Problem Relation Age of Onset  . Cancer Mother        Intestinal metastatic to ovaries    Review of Systems: Constitutional: Doesn't report fevers, chills or abnormal weight loss Eyes: Doesn't report blurriness of vision Ears, nose, mouth, throat, and face:  Doesn't report sore throat Respiratory: Doesn't report cough, dyspnea or wheezes Cardiovascular: Doesn't report palpitation, chest discomfort  Gastrointestinal:  Doesn't report nausea, constipation, diarrhea GU: Doesn't report incontinence Skin: Doesn't report skin rashes Neurological: Per HPI Musculoskeletal: Doesn't report joint pain Behavioral/Psych: Doesn't report anxiety  Physical Exam: Vitals:   12/21/20 1328  BP: (!) 142/80  Pulse: 75  Resp: 18  Temp: 97.8 F (36.6 C)  SpO2: 100%   KPS: 80. General: Alert, cooperative, pleasant, in no acute distress Head: Normal EENT: No conjunctival injection or scleral icterus.  Lungs: Resp effort normal Cardiac: Regular rate Abdomen: Non-distended abdomen Skin: No rashes cyanosis or petechiae. Extremities: No clubbing or edema  Neurologic Exam: Mental Status: Awake, alert, attentive to examiner. Oriented to self and environment. Language is fluent with intact comprehension.  Some psychomotor slowing. Cranial Nerves: Visual acuity is grossly normal. Visual fields are full. Extra-ocular movements intact. No ptosis. Face is symmetric Motor: Tone and bulk are normal. Power is full in both arms and legs. Reflexes are symmetric, no pathologic  reflexes present.  Sensory: Intact to light touch Gait: Normal.   Labs: I have reviewed the data as listed    Component Value Date/Time   NA 139 12/01/2020 1345   K 4.1 12/01/2020 1345   CL 107 12/01/2020 1345   CO2 23 12/01/2020 1345   GLUCOSE 106 (H) 12/01/2020 1345   BUN 26 (H) 12/01/2020 1345   CREATININE 1.90 (H) 12/01/2020 1345   CALCIUM 9.8 12/01/2020 1345   PROT 8.2 (H) 12/01/2020 1345   ALBUMIN 4.0 12/01/2020 1345   AST 19 12/01/2020 1345   ALT 10 12/01/2020 1345   ALKPHOS 115 12/01/2020 1345   BILITOT 0.4 12/01/2020 1345   GFRNONAA 39 (L) 12/01/2020 1345   GFRAA 52 (L) 04/21/2020 1403   Lab Results  Component Value Date   WBC 5.4 12/01/2020   NEUTROABS 3.2 12/01/2020   HGB 12.0 (L) 12/01/2020   HCT 34.9 (L) 12/01/2020   MCV 92.6 12/01/2020   PLT 178 12/01/2020    Imaging:  MR Brain W Wo Contrast  Result Date: 11/26/2020 CLINICAL DATA:  Lung cancer.  Follow-up treated metastatic disease. EXAM: MRI HEAD WITHOUT AND WITH CONTRAST TECHNIQUE: Multiplanar, multiecho pulse sequences of the brain and surrounding structures were obtained without and with intravenous contrast. CONTRAST:  83mL GADAVIST GADOBUTROL 1 MMOL/ML IV SOLN COMPARISON:  MRI head 08/24/2020, 03/13/2020 FINDINGS: Brain: Treated lesion in the high left posterior frontal lobe no longer enhances. Treated lesion in the right parasagittal parietal lobe shows small area of encephalomalacia. Sagittal images suggest possible minimal residual enhancement. New lesion in the right postcentral gyrus on axial image 203. This measures approximately 2 mm and could be an early metastasis. Continued follow-up recommended. Chronic infarct in the left frontal white matter. Chronic infarct in the right basal ganglia unchanged. No acute infarct. Ventricle size normal. Vascular: Normal arterial flow voids Skull and upper cervical spine: Negative Sinuses/Orbits: Mild mucosal edema paranasal sinuses. Negative orbit Other: None  IMPRESSION: Previously treated lesions in the high left frontal lobe and right parasagittal parietal lobe stable New 2 mm lesion in the right postcentral gyrus may represent early metastatic disease. Close follow-up suggested. Electronically Signed   By: Franchot Gallo M.D.   On: 11/26/2020 14:47    [place] Clinician Interpretation: I have personally reviewed the radiological images as listed.  My interpretation, in the context of the patient's clinical presentation, is progressive disease   Assessment/Plan Brain metastases ([place]) [C79.31]  Randy Lark  Andersen is clinically stable today from neurologic standpoint.  His brain MRI demonstrates a small focus of enhancement of uncertain etiology, either early metastasis or imaging artifact.  We will recommend repeat MRI study in 2 months for better characterization.  Cognitive and subjective language complaints are secondary to "multi-hit" phenomena affecting the brain: -Dominant hemisphere stroke with noted volume of residual T2 signal abnormality -Systemic/inflammatory effects of cancer on cognitive function -Systemic/inflammatory effects of chemo/immunotherapy on cognitive function -Multifocal cortically based metastases -Exposure to radiosurgery  The above is enough to explain his deficits, which do not require further workup at this time.  We counseled him regarding exercise, diet, sleep, social engagement, mindfulness and their impact on cognitive health.  We spent twenty additional minutes teaching regarding the natural history, biology, and historical experience in the treatment of neurologic complications of cancer.   We appreciate the opportunity to participate in the care of Randy Andersen.    We ask that Randy Andersen return to clinic in 2 months following next brain MRI, or sooner as needed.  All questions were answered. The patient knows to call the clinic with any problems, questions or concerns. No barriers to learning were detected.  The  total time spent in the encounter was 40 minutes and more than 50% was on counseling and review of test results   Ventura Sellers, MD Medical Director of Neuro-Oncology [facility] at [place] 12/22/20 10:22 AM

## 2020-12-24 ENCOUNTER — Telehealth: Payer: Self-pay | Admitting: Internal Medicine

## 2020-12-24 NOTE — Telephone Encounter (Signed)
Scheduled appt per 6/2 sch msg. Called pt, no answer. Left msg with appt date and time.

## 2021-01-12 ENCOUNTER — Inpatient Hospital Stay: Payer: No Typology Code available for payment source

## 2021-01-12 ENCOUNTER — Inpatient Hospital Stay (HOSPITAL_BASED_OUTPATIENT_CLINIC_OR_DEPARTMENT_OTHER): Payer: No Typology Code available for payment source | Admitting: Internal Medicine

## 2021-01-12 ENCOUNTER — Encounter: Payer: Self-pay | Admitting: Nutrition

## 2021-01-12 ENCOUNTER — Other Ambulatory Visit: Payer: Self-pay

## 2021-01-12 VITALS — BP 167/94 | HR 91 | Temp 97.9°F | Resp 19 | Ht 74.0 in | Wt 181.9 lb

## 2021-01-12 DIAGNOSIS — C7931 Secondary malignant neoplasm of brain: Secondary | ICD-10-CM

## 2021-01-12 DIAGNOSIS — C3490 Malignant neoplasm of unspecified part of unspecified bronchus or lung: Secondary | ICD-10-CM

## 2021-01-12 DIAGNOSIS — Z5112 Encounter for antineoplastic immunotherapy: Secondary | ICD-10-CM | POA: Diagnosis not present

## 2021-01-12 DIAGNOSIS — C3411 Malignant neoplasm of upper lobe, right bronchus or lung: Secondary | ICD-10-CM | POA: Diagnosis not present

## 2021-01-12 LAB — CMP (CANCER CENTER ONLY)
ALT: 14 U/L (ref 0–44)
AST: 13 U/L — ABNORMAL LOW (ref 15–41)
Albumin: 3.8 g/dL (ref 3.5–5.0)
Alkaline Phosphatase: 111 U/L (ref 38–126)
Anion gap: 10 (ref 5–15)
BUN: 19 mg/dL (ref 8–23)
CO2: 20 mmol/L — ABNORMAL LOW (ref 22–32)
Calcium: 9.7 mg/dL (ref 8.9–10.3)
Chloride: 107 mmol/L (ref 98–111)
Creatinine: 1.76 mg/dL — ABNORMAL HIGH (ref 0.61–1.24)
GFR, Estimated: 42 mL/min — ABNORMAL LOW (ref >60)
Glucose, Bld: 128 mg/dL — ABNORMAL HIGH (ref 70–99)
Potassium: 3.5 mmol/L (ref 3.5–5.1)
Sodium: 137 mmol/L (ref 135–145)
Total Bilirubin: 0.4 mg/dL (ref 0.3–1.2)
Total Protein: 8.1 g/dL (ref 6.5–8.1)

## 2021-01-12 LAB — CBC WITH DIFFERENTIAL (CANCER CENTER ONLY)
Abs Immature Granulocytes: 0.01 10*3/uL (ref 0.00–0.07)
Basophils Absolute: 0 10*3/uL (ref 0.0–0.1)
Basophils Relative: 1 %
Eosinophils Absolute: 0.1 10*3/uL (ref 0.0–0.5)
Eosinophils Relative: 2 %
HCT: 39.9 % (ref 39.0–52.0)
Hemoglobin: 14 g/dL (ref 13.0–17.0)
Immature Granulocytes: 0 %
Lymphocytes Relative: 24 %
Lymphs Abs: 1.5 10*3/uL (ref 0.7–4.0)
MCH: 31 pg (ref 26.0–34.0)
MCHC: 35.1 g/dL (ref 30.0–36.0)
MCV: 88.3 fL (ref 80.0–100.0)
Monocytes Absolute: 0.6 10*3/uL (ref 0.1–1.0)
Monocytes Relative: 10 %
Neutro Abs: 3.8 10*3/uL (ref 1.7–7.7)
Neutrophils Relative %: 63 %
Platelet Count: 209 10*3/uL (ref 150–400)
RBC: 4.52 MIL/uL (ref 4.22–5.81)
RDW: 12.3 % (ref 11.5–15.5)
WBC Count: 6 10*3/uL (ref 4.0–10.5)
nRBC: 0 % (ref 0.0–0.2)

## 2021-01-12 LAB — TSH: TSH: 2.421 u[IU]/mL (ref 0.320–4.118)

## 2021-01-12 MED ORDER — SODIUM CHLORIDE 0.9 % IV SOLN
200.0000 mg | Freq: Once | INTRAVENOUS | Status: AC
  Administered 2021-01-12: 200 mg via INTRAVENOUS
  Filled 2021-01-12: qty 8

## 2021-01-12 MED ORDER — SODIUM CHLORIDE 0.9 % IV SOLN
Freq: Once | INTRAVENOUS | Status: AC
  Filled 2021-01-12: qty 250

## 2021-01-12 NOTE — Progress Notes (Signed)
Provided 1 complementary case of Ensure plus.

## 2021-01-12 NOTE — Progress Notes (Signed)
[facility] Telephone:([phone]) [phone]   Fax:([phone]) [facility] [address] [place] [address]  DIAGNOSIS: Stage IV non-small cell lung cancer, favored to be adenocarcinoma.  He presented with posterior right upper lobe nodule as well as right hilar, infrahilar, subcarinal, and right paratracheal lymphadenopathy.  He also has a 1.4 cm x 1 cm soft tissue nodule in the skin/subcutaneous fat in the left axilla.  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  He was diagnosed in August 2021   Molecular Biomarkers: BIOMARKER(S)         % CFDNA OR AMPLIFICATION       ASSOCIATED FDA-APPROVED THERAPIES        CLINICAL TRIAL AVAILABILITY [id number] 2.9% None    Yes [id number] 0.4% None    Yes [id number] 0.2% None    Yes   PRIOR THERAPY:  1) SRS to the small metastatic brain lesion under the care of Dr. [name] on 03/18/20   CURRENT THERAPY: Palliative systemic chemotherapy with carboplatin for an AUC of 5, Alimta 500 mg/m2, and Keytruda 200 mg IV every 3 weeks. First dose expected on 04/07/20.  Status post 13 cycles.  Starting from cycle #5 he will be on maintenance treatment with Alimta and Keytruda every 3 weeks.  For the last few cycles he is currently on maintenance treatment with single agent Keytruda.  INTERVAL HISTORY: Alexandar Weisenberger 65 y.o. male returns to the clinic today for follow-up visit.  The patient is feeling fine today with no concerning complaints except for fatigue.  He stays home most of the time and does not exercise at regular basis or doing any work.  He also has lack of sleep.  He denied having any chest pain, shortness of breath, cough or hemoptysis.  He denied having any fever or chills.  He has no nausea, vomiting, diarrhea or constipation.  He has no headache or visual changes.  He has no weight loss or night sweats.  He is here today for evaluation before starting  cycle #14 of his treatment.   MEDICAL HISTORY: Past Medical History:  Diagnosis Date   Asthma    as a child   Chronic pain disorder    LT leg and foot   Heart murmur    as a child   Hypertension    Malignant neoplasm of upper lobe of right lung ([facility])    Stroke ([facility])    mini stroke - 2015, some tingling in fingers on right     ALLERGIES:  has No Known Allergies.  MEDICATIONS:  Current Outpatient Medications  Medication Sig Dispense Refill   amLODipine (NORVASC) 10 MG tablet Take 10 mg by mouth daily.     Cholecalciferol 100 MCG (4000 UT) TABS Take 2 tablets by mouth daily.     doxylamine, Sleep, (UNISOM) 25 MG tablet Take 25 mg by mouth at bedtime as needed for sleep.     folic acid (FOLVITE) 1 MG tablet Take 1 tablet (1 mg total) by mouth daily. 30 tablet 2   HYDROcodone-acetaminophen (NORCO) 10-325 MG tablet Take 1 tablet by mouth 2 (two) times daily.     oxybutynin (DITROPAN-XL) 10 MG 24 hr tablet Take by mouth.     phenazopyridine (PYRIDIUM) 200 MG tablet Take 1 tablet (200 mg total) by mouth 3 (three) times daily. 6 tablet 0   polyethylene glycol powder (GLYCOLAX/MIRALAX) 17 GM/SCOOP powder TAKE 17  GRAMS BY MOUTH DAILY (MIX WITH WATER OR OTHER LIQUID AS INSTRUCTED BEFORE DRINKING)     prochlorperazine (COMPAZINE) 10 MG tablet Take 1 tablet (10 mg total) by mouth every 6 (six) hours as needed. 30 tablet 2   rivaroxaban (XARELTO) 20 MG TABS tablet TAKE 1 TABLET BY MOUTH ONCE A DAY WITH SUPPER 30 tablet 2   senna-docusate (SENOKOT-S) 8.6-50 MG tablet Take 2 tablets by mouth daily.     tadalafil (CIALIS) 20 MG tablet Take by mouth.     tamsulosin (FLOMAX) 0.4 MG CAPS capsule Take 1 capsule (0.4 mg total) by mouth daily. 30 capsule 2   temazepam (RESTORIL) 15 MG capsule Take 1 capsule (15 mg total) by mouth at bedtime as needed for sleep. Pt is veteran - Call pt to set up delivery 30 capsule 0   No current facility-administered medications for this visit.    SURGICAL HISTORY:   Past Surgical History:  Procedure Laterality Date   BUNIONECTOMY Left    had hammer toe surgery also and callus removed   BUNIONECTOMY Right    also had hammer toe surgery and callus removed   LEG SURGERY Left    PT reports he has pins placed in his Lt leg   ORIF TIBIA PLATEAU  10/09/2012   Dr Lorin Mercy   ORIF TIBIA PLATEAU Left 10/08/2012   Procedure: OPEN REDUCTION INTERNAL FIXATION (ORIF) TIBIAL PLATEAU;  Surgeon: Marybelle Killings, MD;  Location: [facility];  Service: Orthopedics;  Laterality: Left;   VIDEO BRONCHOSCOPY WITH ENDOBRONCHIAL NAVIGATION N/A 02/25/2020   Procedure: VIDEO BRONCHOSCOPY WITH ENDOBRONCHIAL NAVIGATION with biopsies;  Surgeon: Collene Gobble, MD;  Location: [facility];  Service: Thoracic;  Laterality: N/A;   VIDEO BRONCHOSCOPY WITH ENDOBRONCHIAL ULTRASOUND N/A 02/25/2020   Procedure: VIDEO BRONCHOSCOPY WITH ENDOBRONCHIAL ULTRASOUND;  Surgeon: Collene Gobble, MD;  Location: [facility];  Service: Thoracic;  Laterality: N/A;    REVIEW OF SYSTEMS:  A comprehensive review of systems was negative except for: Constitutional: positive for fatigue Behavioral/Psych: positive for sleep disturbance   PHYSICAL EXAMINATION: General appearance: alert, cooperative, and no distress Head: Normocephalic, without obvious abnormality, atraumatic Neck: no adenopathy, no JVD, supple, symmetrical, trachea midline, and thyroid not enlarged, symmetric, no tenderness/mass/nodules Lymph nodes: Cervical, supraclavicular, and axillary nodes normal. Resp: clear to auscultation bilaterally Back: symmetric, no curvature. ROM normal. No CVA tenderness. Cardio: regular rate and rhythm, S1, S2 normal, no murmur, click, rub or gallop GI: soft, non-tender; bowel sounds normal; no masses,  no organomegaly Extremities: extremities normal, atraumatic, no cyanosis or edema  ECOG PERFORMANCE STATUS: 1 - Symptomatic but completely ambulatory  Blood pressure (!) 167/94, pulse 91, temperature 97.9 F (36.6 C), temperature  source Tympanic, resp. rate 19, height 6\' 2"  (1.88 m), weight 181 lb 14.4 oz (82.5 kg), SpO2 100 %.  LABORATORY DATA: Lab Results  Component Value Date   WBC 6.0 01/12/2021   HGB 14.0 01/12/2021   HCT 39.9 01/12/2021   MCV 88.3 01/12/2021   PLT 209 01/12/2021      Chemistry      Component Value Date/Time   NA 141 12/22/2020 1354   K 3.7 12/22/2020 1354   CL 108 12/22/2020 1354   CO2 21 (L) 12/22/2020 1354   BUN 18 12/22/2020 1354   CREATININE 1.62 (H) 12/22/2020 1354      Component Value Date/Time   CALCIUM 9.4 12/22/2020 1354   ALKPHOS 110 12/22/2020 1354   AST 14 (L) 12/22/2020 1354   ALT 9 12/22/2020  1354   BILITOT 0.4 12/22/2020 1354       RADIOGRAPHIC STUDIES: No results found.   ASSESSMENT AND PLAN: This is a very pleasant 65 years old African-American male diagnosed with stage IV non-small cell lung cancer, adenocarcinoma presented with right upper lobe nodule in addition to right hilar, infrahilar, subcarinal and right paratracheal adenopathy in addition to subcutaneous nodules in the left axilla and 2 small metastatic brain lesion diagnosed in August 2021.  The patient has no actionable mutations. He is currently undergoing systemic chemotherapy with carboplatin for AUC of 5, Alimta 500 mg/M2 and Keytruda 200 mg IV every 3 weeks.  Status post 13 cycles.  Starting from cycle #5 he will be on maintenance treatment with Alimta and Keytruda every 3 weeks.  I reduce the dose of his Alimta to 400 mg/M2 secondary to the renal insufficiency. The patient is currently on treatment with single agent Keytruda and Alimta was discontinued secondary to worsening renal insufficiency. He has been tolerating his treatment with Keytruda fairly well with no concerning adverse effect except for mild fatigue. I recommended for the patient to proceed with cycle #14 today as planned. I will see him back for follow-up visit in 3 weeks for evaluation with repeat CT scan of the chest, abdomen  pelvis for restaging of his disease. The patient was advised to increase his exercise and try to join a fitness facility for mild to moderate exercise regime. He was advised to call immediately if he has any other concerning symptoms in the interval. The patient voices understanding of current disease status and treatment options and is in agreement with the current care plan.  All questions were answered. The patient knows to call the clinic with any problems, questions or concerns. We can certainly see the patient much sooner if necessary.   Disclaimer: This note was dictated with voice recognition software. Similar sounding words can inadvertently be transcribed and may not be corrected upon review.

## 2021-01-12 NOTE — Patient Instructions (Signed)
[facility]  Discharge Instructions: Thank you for choosing [facility] to provide your oncology and hematology care.   If you have a lab appointment with the [facility], please go directly to the [facility] and check in at the registration area.   Wear comfortable clothing and clothing appropriate for easy access to any Portacath or PICC line.   We strive to give you quality time with your provider. You may need to reschedule your appointment if you arrive late (15 or more minutes).  Arriving late affects you and other patients whose appointments are after yours.  Also, if you miss three or more appointments without notifying the office, you may be dismissed from the clinic at the provider's discretion.      For prescription refill requests, have your pharmacy contact our office and allow 72 hours for refills to be completed.    Today you received the following chemotherapy and/or immunotherapy agents Randy Andersen      To help prevent nausea and vomiting after your treatment, we encourage you to take your nausea medication as directed.  BELOW ARE SYMPTOMS THAT SHOULD BE REPORTED IMMEDIATELY: *FEVER GREATER THAN 100.4 F (38 C) OR HIGHER *CHILLS OR SWEATING *NAUSEA AND VOMITING THAT IS NOT CONTROLLED WITH YOUR NAUSEA MEDICATION *UNUSUAL SHORTNESS OF BREATH *UNUSUAL BRUISING OR BLEEDING *URINARY PROBLEMS (pain or burning when urinating, or frequent urination) *BOWEL PROBLEMS (unusual diarrhea, constipation, pain near the anus) TENDERNESS IN MOUTH AND THROAT WITH OR WITHOUT PRESENCE OF ULCERS (sore throat, sores in mouth, or a toothache) UNUSUAL RASH, SWELLING OR PAIN  UNUSUAL VAGINAL DISCHARGE OR ITCHING   Items with * indicate a potential emergency and should be followed up as soon as possible or go to the Emergency Department if any problems should occur.  Please show the CHEMOTHERAPY ALERT CARD or IMMUNOTHERAPY ALERT CARD at check-in to  the Emergency Department and triage nurse.  Should you have questions after your visit or need to cancel or reschedule your appointment, please contact [facility]  Dept: [phone]  and follow the prompts.  Office hours are 8:00 a.m. to 4:30 p.m. Monday - Friday. Please note that voicemails left after 4:00 p.m. may not be returned until the following business day.  We are closed weekends and major holidays. You have access to a nurse at all times for urgent questions. Please call the main number to the clinic Dept: [phone] and follow the prompts.   For any non-urgent questions, you may also contact your provider using MyChart. We now offer e-Visits for anyone 39 and older to request care online for non-urgent symptoms. For details visit [email or web address].   Also download the MyChart app! Go to the app store, search "MyChart", open the app, select [facility], and log in with your MyChart username and password.  Due to Covid, a mask is required upon entering the hospital/clinic. If you do not have a mask, one will be given to you upon arrival. For doctor visits, patients may have 1 support person aged 36 or older with them. For treatment visits, patients cannot have anyone with them due to current Covid guidelines and our immunocompromised population.

## 2021-01-12 NOTE — Progress Notes (Signed)
Per Dr. Julien Nordmann, ok to treat with elevated Creatinine.

## 2021-01-13 ENCOUNTER — Telehealth: Payer: Self-pay | Admitting: Nutrition

## 2021-01-13 ENCOUNTER — Encounter: Payer: No Typology Code available for payment source | Admitting: Nutrition

## 2021-01-13 NOTE — Telephone Encounter (Signed)
Patient requested nutrition follow-up.  He was agreeable to a telephone consult. Weight decreased and documented as 181.9 pounds on June 22 decreased from 189.7 pounds on April 20.  This is a 4% weight loss over 2 months which is concerning.  He reports he has lost muscle. Patient reports he has not been exercising or eating well.  He denies problems with tolerating his treatment for stage IV non-small cell lung cancer.  Educated patient to increase calories and protein in small amounts throughout the day. Increase Ensure Plus twice daily. Continue activity as tolerated so as not to lose additional muscle mass. Increase healthy diet throughout the day.  Patient verbalizes understanding and agreement.  He has no further questions or concerns.

## 2021-01-28 NOTE — Progress Notes (Signed)
[place] [address]  DIAGNOSIS: Stage IV non-small cell lung cancer, favored to be adenocarcinoma.  He presented with posterior right upper lobe nodule as well as right hilar, infrahilar, subcarinal, and right paratracheal lymphadenopathy.  He also has a 1.4 cm x 1 cm soft tissue nodule in the skin/subcutaneous fat in the left axilla.  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  He was diagnosed in August 2021   Molecular Biomarkers: BIOMARKER(S)         % CFDNA OR AMPLIFICATION       ASSOCIATED FDA-APPROVED THERAPIES        CLINICAL TRIAL AVAILABILITY [id number] 2.9% None    Yes [id number] 0.4% None    Yes [id number] 0.2% None    Yes    PRIOR THERAPY: SRS to the small metastatic brain lesion under the care of Dr. [name] on 03/18/20  CURRENT THERAPY: Palliative systemic chemotherapy with carboplatin for an AUC of 5, Alimta 500 mg/m2, and Keytruda 200 mg IV every 3 weeks. First dose expected on 04/07/20. Status post 14 cycles. Starting from cycle #5 he will be on maintenance treatment with Alimta and Keytruda every 3 weeks. His dose of Alimta was reduced to 400 mg/m2. Alimta removed starting from cycle #11 due to renal insuffiencey.   INTERVAL HISTORY: Randy Andersen 65 y.o. male returns to the clinic today for a follow up visit. The patient is feeling well today without any concerning complaints except for fatigue. He is tolerating his treatment well without any adverse side effects. Alimta was removed from his treatment plan due to renal insufficiency. He is currently undergoing single agent immunotherapy with Keytruda. He recently had a telephone visit with nutrition due to weight loss and decreased appetite. He did gain a few pounds since his appointment. He has been drinking 2 ensures per day. He also has been trying to be more active with doing push-ups and  sit ups. He denies fevers, chills, recent night sweats, or shortness of breath. He believes he may have been coughing a little bit more. He produces brown/tan mucus. He does not take anything for cough. Denies sore throat or sick contacts. He denies nausea, vomiting, diarrhea, or constipation. Denies chest pain or hemoptysis. He denies rashes or skin changes. He denies headaches or visual changes. He is followed by neuro-oncology for his history of metastatic disease to the brain. He has a repeat brain MRI scheduled for later this month. He has a restaging CT scan scheduled for 02/07/21 due to issues with insurance. He is requesting a refill of Unisom or Restoril which it looks like was prescribed in November. He is here for evaluation before starting cycle #15.   MEDICAL HISTORY: Past Medical History:  Diagnosis Date   Asthma    as a child   Chronic pain disorder    LT leg and foot   Heart murmur    as a child   Hypertension    Malignant neoplasm of upper lobe of right lung ([facility])    Stroke ([facility])    mini stroke - 2015, some tingling in fingers on right     ALLERGIES:  has No Known Allergies.  MEDICATIONS:  Current Outpatient Medications  Medication Sig Dispense Refill   amLODipine (NORVASC) 10 MG tablet Take 10 mg by mouth daily.     Cholecalciferol 100 MCG (4000 UT) TABS Take 2 tablets by mouth  daily.     doxylamine, Sleep, (UNISOM) 25 MG tablet Take 25 mg by mouth at bedtime as needed for sleep.     folic acid (FOLVITE) 1 MG tablet Take 1 tablet (1 mg total) by mouth daily. 30 tablet 2   HYDROcodone-acetaminophen (NORCO) 10-325 MG tablet Take 1 tablet by mouth 2 (two) times daily.     oxybutynin (DITROPAN-XL) 10 MG 24 hr tablet Take by mouth.     phenazopyridine (PYRIDIUM) 200 MG tablet Take 1 tablet (200 mg total) by mouth 3 (three) times daily. 6 tablet 0   polyethylene glycol powder (GLYCOLAX/MIRALAX) 17 GM/SCOOP powder TAKE 17 GRAMS BY MOUTH DAILY (MIX WITH WATER OR OTHER LIQUID AS  INSTRUCTED BEFORE DRINKING)     prochlorperazine (COMPAZINE) 10 MG tablet Take 1 tablet (10 mg total) by mouth every 6 (six) hours as needed. 30 tablet 2   rivaroxaban (XARELTO) 20 MG TABS tablet TAKE 1 TABLET BY MOUTH ONCE A DAY WITH SUPPER 30 tablet 2   senna-docusate (SENOKOT-S) 8.6-50 MG tablet Take 2 tablets by mouth daily.     tadalafil (CIALIS) 20 MG tablet Take by mouth.     tamsulosin (FLOMAX) 0.4 MG CAPS capsule Take 1 capsule (0.4 mg total) by mouth daily. 30 capsule 2   temazepam (RESTORIL) 15 MG capsule Take 1 capsule (15 mg total) by mouth at bedtime as needed for sleep. Pt is veteran - Call pt to set up delivery 30 capsule 0   No current facility-administered medications for this visit.   Facility-Administered Medications Ordered in Other Visits  Medication Dose Route Frequency Provider Last Rate Last Admin   pembrolizumab (KEYTRUDA) 200 mg in sodium chloride 0.9 % 50 mL chemo infusion  200 mg Intravenous Once [name], MD        SURGICAL HISTORY:  Past Surgical History:  Procedure Laterality Date   BUNIONECTOMY Left    had hammer toe surgery also and callus removed   BUNIONECTOMY Right    also had hammer toe surgery and callus removed   LEG SURGERY Left    PT reports he has pins placed in his Lt leg   ORIF TIBIA PLATEAU  10/09/2012   Dr Lorin Mercy   ORIF TIBIA PLATEAU Left 10/08/2012   Procedure: OPEN REDUCTION INTERNAL FIXATION (ORIF) TIBIAL PLATEAU;  Surgeon: Marybelle Killings, MD;  Location: [facility];  Service: Orthopedics;  Laterality: Left;   VIDEO BRONCHOSCOPY WITH ENDOBRONCHIAL NAVIGATION N/A 02/25/2020   Procedure: VIDEO BRONCHOSCOPY WITH ENDOBRONCHIAL NAVIGATION with biopsies;  Surgeon: Collene Gobble, MD;  Location: [facility];  Service: Thoracic;  Laterality: N/A;   VIDEO BRONCHOSCOPY WITH ENDOBRONCHIAL ULTRASOUND N/A 02/25/2020   Procedure: VIDEO BRONCHOSCOPY WITH ENDOBRONCHIAL ULTRASOUND;  Surgeon: Collene Gobble, MD;  Location[facility];  Service: Thoracic;  Laterality:  N/A;    REVIEW OF SYSTEMS:   Review of Systems Constitutional: Negative for appetite change, chills, fatigue, fever and unexpected weight change. HENT: Negative for mouth sores, nosebleeds, sore throat and trouble swallowing.   Eyes: Negative for eye problems and icterus. Respiratory: Positive for cough. Negative for hemoptysis, shortness of breath and wheezing.   Cardiovascular: Negative for chest pain and leg swelling. Gastrointestinal: Negative for abdominal pain, constipation, diarrhea, nausea and vomiting. Genitourinary: Negative for bladder incontinence, difficulty urinating, dysuria, frequency and hematuria.   Musculoskeletal: Negative for back pain, gait problem, neck pain and neck stiffness. Skin: Negative for itching and rash. Neurological: Negative for dizziness, extremity weakness, gait problem, headaches, light-headedness and seizures. Hematological: Negative  for adenopathy. Does not bruise/bleed easily. Psychiatric/Behavioral: Negative for confusion, depression and sleep disturbance. The patient is not nervous/anxious.    PHYSICAL EXAMINATION:  Blood pressure (!) 145/92, pulse 67, temperature 98.2 F (36.8 C), temperature source Oral, resp. rate 20, weight 183 lb 12.8 oz (83.4 kg), SpO2 100 %.  ECOG PERFORMANCE STATUS: 1  Physical Exam  Constitutional: Oriented to person, place, and time and well-developed, well-nourished, and in no distress. HENT: Head: Normocephalic and atraumatic. Mouth/Throat: Oropharynx is clear and moist. No oropharyngeal exudate. Eyes: Conjunctivae are normal. Right eye exhibits no discharge. Left eye exhibits no discharge. No scleral icterus. Neck: Normal range of motion. Neck supple. Cardiovascular: Normal rate, regular rhythm, normal heart sounds and intact distal pulses.   Pulmonary/Chest: Effort normal and breath sounds normal. No respiratory distress. No wheezes. No rales. Abdominal: Soft. Bowel sounds are normal. Exhibits no distension  and no mass. There is no tenderness.  Musculoskeletal: Normal range of motion. Exhibits no edema.  Lymphadenopathy:    No cervical adenopathy.  Neurological: Alert and oriented to person, place, and time. Exhibits normal muscle tone. Gait normal. Coordination normal. Skin: Skin is warm and dry. No rash noted. Not diaphoretic. No erythema. No pallor.  Psychiatric: Mood, memory and judgment normal. Vitals reviewed.  LABORATORY DATA: Lab Results  Component Value Date   WBC 5.5 02/02/2021   HGB 13.9 02/02/2021   HCT 41.3 02/02/2021   MCV 89.6 02/02/2021   PLT 171 02/02/2021      Chemistry      Component Value Date/Time   NA 141 02/02/2021 1007   K 3.9 02/02/2021 1007   CL 108 02/02/2021 1007   CO2 23 02/02/2021 1007   BUN 16 02/02/2021 1007   CREATININE 1.76 (H) 02/02/2021 1007      Component Value Date/Time   CALCIUM 9.7 02/02/2021 1007   ALKPHOS 108 02/02/2021 1007   AST 14 (L) 02/02/2021 1007   ALT 10 02/02/2021 1007   BILITOT 0.4 02/02/2021 1007       RADIOGRAPHIC STUDIES:  No results found.   ASSESSMENT/PLAN:  This is a very pleasant 65 year old African-American male with stage IV non-small cell lung cancer, favored to be adenocarcinoma.  He presented with posterior right upper lobe nodule as well as right hilar, infrahilar, subcarinal, and right paratracheal lymphadenopathy.  He also has a 1.4 cm x 1 cm soft tissue nodule in the skin/subcutaneous fat in the left axilla.  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  He was diagnosed in August 2021.  Molecular studies by [facility] are negative for any actionable mutations.   The patient completed SRS to the small metastatic brain lesion under the care of Dr. [name] on 03/18/20   The patient is currently undergoing palliative systemic chemotherapy with carboplatin for an AUC of 5, Alimta 500 mg per metered squared, and Keytruda 200 mg IV every 3 weeks.  He is status post 14 cycles. The dose of  alimta was reduced to 400 mg/m2 due to renal insufficiency. Alimta was removed from the treatment plan with cycle #11 due to renal insufficiency.     Labs were reviewed. His creatinine is stable at 1.76 today. Recommend that he proceed with cycle #15 today as scheduled with single agent immunotherapy with Keytruda.  He will keep his restaging CT scan on 02/07/21 as scheduled. We will reassess his lung at that time for his slightly worsening cough. Overall, the patient is well appearing. No leukocytosis on  labs. No fevers, chills, night sweats, chest pain, or shortness of breath. Advised the patient he may use delsym if needed for cough.   We will see him back for a follow up visit in 3 weeks for evaluation before starting cycle #16  He will continue to follow with neuro-oncology for his history of metastatic disease to the brain.   I discussed with the patient that unisom is OTC if needed. He states he never picked up the prescription for restoril. I advised him to call the pharmacy to ship the medication if needed. However, I cautioned him not to take both sleeping medications.    The patient was advised to call immediately if he has any concerning symptoms in the interval. The patient voices understanding of current disease status and treatment options and is in agreement with the current care plan. All questions were answered. The patient knows to call the clinic with any problems, questions or concerns. We can certainly see the patient much sooner if necessary           No orders of the defined types were placed in this encounter.     The total time spent in the appointment was 20-29 minutes in this encounter.   Zarra Geffert L Tomi Grandpre, PA-C 02/02/21

## 2021-01-31 ENCOUNTER — Ambulatory Visit (HOSPITAL_COMMUNITY): Admission: RE | Admit: 2021-01-31 | Payer: No Typology Code available for payment source | Source: Ambulatory Visit

## 2021-02-02 ENCOUNTER — Inpatient Hospital Stay: Payer: No Typology Code available for payment source

## 2021-02-02 ENCOUNTER — Inpatient Hospital Stay: Payer: No Typology Code available for payment source | Attending: Internal Medicine

## 2021-02-02 ENCOUNTER — Other Ambulatory Visit: Payer: Self-pay

## 2021-02-02 ENCOUNTER — Inpatient Hospital Stay (HOSPITAL_BASED_OUTPATIENT_CLINIC_OR_DEPARTMENT_OTHER): Payer: No Typology Code available for payment source | Admitting: Physician Assistant

## 2021-02-02 VITALS — BP 153/82

## 2021-02-02 VITALS — BP 145/92 | HR 67 | Temp 98.2°F | Resp 20 | Wt 183.8 lb

## 2021-02-02 DIAGNOSIS — C3411 Malignant neoplasm of upper lobe, right bronchus or lung: Secondary | ICD-10-CM | POA: Insufficient documentation

## 2021-02-02 DIAGNOSIS — C7931 Secondary malignant neoplasm of brain: Secondary | ICD-10-CM | POA: Insufficient documentation

## 2021-02-02 DIAGNOSIS — Z5112 Encounter for antineoplastic immunotherapy: Secondary | ICD-10-CM | POA: Insufficient documentation

## 2021-02-02 DIAGNOSIS — Z79899 Other long term (current) drug therapy: Secondary | ICD-10-CM | POA: Diagnosis not present

## 2021-02-02 LAB — CMP (CANCER CENTER ONLY)
ALT: 10 U/L (ref 0–44)
AST: 14 U/L — ABNORMAL LOW (ref 15–41)
Albumin: 3.5 g/dL (ref 3.5–5.0)
Alkaline Phosphatase: 108 U/L (ref 38–126)
Anion gap: 10 (ref 5–15)
BUN: 16 mg/dL (ref 8–23)
CO2: 23 mmol/L (ref 22–32)
Calcium: 9.7 mg/dL (ref 8.9–10.3)
Chloride: 108 mmol/L (ref 98–111)
Creatinine: 1.76 mg/dL — ABNORMAL HIGH (ref 0.61–1.24)
GFR, Estimated: 42 mL/min — ABNORMAL LOW (ref >60)
Glucose, Bld: 105 mg/dL — ABNORMAL HIGH (ref 70–99)
Potassium: 3.9 mmol/L (ref 3.5–5.1)
Sodium: 141 mmol/L (ref 135–145)
Total Bilirubin: 0.4 mg/dL (ref 0.3–1.2)
Total Protein: 7.9 g/dL (ref 6.5–8.1)

## 2021-02-02 LAB — CBC WITH DIFFERENTIAL (CANCER CENTER ONLY)
Abs Immature Granulocytes: 0.01 10*3/uL (ref 0.00–0.07)
Basophils Absolute: 0.1 10*3/uL (ref 0.0–0.1)
Basophils Relative: 1 %
Eosinophils Absolute: 0.1 10*3/uL (ref 0.0–0.5)
Eosinophils Relative: 2 %
HCT: 41.3 % (ref 39.0–52.0)
Hemoglobin: 13.9 g/dL (ref 13.0–17.0)
Immature Granulocytes: 0 %
Lymphocytes Relative: 33 %
Lymphs Abs: 1.8 10*3/uL (ref 0.7–4.0)
MCH: 30.2 pg (ref 26.0–34.0)
MCHC: 33.7 g/dL (ref 30.0–36.0)
MCV: 89.6 fL (ref 80.0–100.0)
Monocytes Absolute: 0.6 10*3/uL (ref 0.1–1.0)
Monocytes Relative: 10 %
Neutro Abs: 2.9 10*3/uL (ref 1.7–7.7)
Neutrophils Relative %: 54 %
Platelet Count: 171 10*3/uL (ref 150–400)
RBC: 4.61 MIL/uL (ref 4.22–5.81)
RDW: 12.3 % (ref 11.5–15.5)
WBC Count: 5.5 10*3/uL (ref 4.0–10.5)
nRBC: 0 % (ref 0.0–0.2)

## 2021-02-02 LAB — TSH: TSH: 1.506 u[IU]/mL (ref 0.320–4.118)

## 2021-02-02 MED ORDER — SODIUM CHLORIDE 0.9 % IV SOLN
Freq: Once | INTRAVENOUS | Status: AC
  Filled 2021-02-02: qty 250

## 2021-02-02 MED ORDER — SODIUM CHLORIDE 0.9 % IV SOLN
200.0000 mg | Freq: Once | INTRAVENOUS | Status: AC
  Administered 2021-02-02: 200 mg via INTRAVENOUS
  Filled 2021-02-02: qty 8

## 2021-02-02 NOTE — Progress Notes (Signed)
Ok to treat with SCR 1.76 per C. Heilengoetter, PA

## 2021-02-02 NOTE — Patient Instructions (Signed)
[facility]  Discharge Instructions: Thank you for choosing [facility] to provide your oncology and hematology care.   If you have a lab appointment with the [facility], please go directly to the [facility] and check in at the registration area.   Wear comfortable clothing and clothing appropriate for easy access to any Portacath or PICC line.   We strive to give you quality time with your provider. You may need to reschedule your appointment if you arrive late (15 or more minutes).  Arriving late affects you and other patients whose appointments are after yours.  Also, if you miss three or more appointments without notifying the office, you may be dismissed from the clinic at the provider's discretion.      For prescription refill requests, have your pharmacy contact our office and allow 72 hours for refills to be completed.    Today you received the following chemotherapy and/or immunotherapy agents Randy Andersen      To help prevent nausea and vomiting after your treatment, we encourage you to take your nausea medication as directed.  BELOW ARE SYMPTOMS THAT SHOULD BE REPORTED IMMEDIATELY: *FEVER GREATER THAN 100.4 F (38 C) OR HIGHER *CHILLS OR SWEATING *NAUSEA AND VOMITING THAT IS NOT CONTROLLED WITH YOUR NAUSEA MEDICATION *UNUSUAL SHORTNESS OF BREATH *UNUSUAL BRUISING OR BLEEDING *URINARY PROBLEMS (pain or burning when urinating, or frequent urination) *BOWEL PROBLEMS (unusual diarrhea, constipation, pain near the anus) TENDERNESS IN MOUTH AND THROAT WITH OR WITHOUT PRESENCE OF ULCERS (sore throat, sores in mouth, or a toothache) UNUSUAL RASH, SWELLING OR PAIN  UNUSUAL VAGINAL DISCHARGE OR ITCHING   Items with * indicate a potential emergency and should be followed up as soon as possible or go to the Emergency Department if any problems should occur.  Please show the CHEMOTHERAPY ALERT CARD or IMMUNOTHERAPY ALERT CARD at check-in to  the Emergency Department and triage nurse.  Should you have questions after your visit or need to cancel or reschedule your appointment, please contact [facility]  Dept: [phone]  and follow the prompts.  Office hours are 8:00 a.m. to 4:30 p.m. Monday - Friday. Please note that voicemails left after 4:00 p.m. may not be returned until the following business day.  We are closed weekends and major holidays. You have access to a nurse at all times for urgent questions. Please call the main number to the clinic Dept: [phone] and follow the prompts.   For any non-urgent questions, you may also contact your provider using MyChart. We now offer e-Visits for anyone 66 and older to request care online for non-urgent symptoms. For details visit [email or web address].   Also download the MyChart app! Go to the app store, search "MyChart", open the app, select [facility], and log in with your MyChart username and password.  Due to Covid, a mask is required upon entering the hospital/clinic. If you do not have a mask, one will be given to you upon arrival. For doctor visits, patients may have 1 support person aged 90 or older with them. For treatment visits, patients cannot have anyone with them due to current Covid guidelines and our immunocompromised population.

## 2021-02-03 ENCOUNTER — Telehealth: Payer: Self-pay | Admitting: Internal Medicine

## 2021-02-03 NOTE — Telephone Encounter (Signed)
Scheduled appointment per 07/14 sch msg. Patient will receive updated calender.

## 2021-02-07 ENCOUNTER — Ambulatory Visit (HOSPITAL_COMMUNITY)
Admission: RE | Admit: 2021-02-07 | Discharge: 2021-02-07 | Disposition: A | Discharge status: Home / Self Care | Payer: No Typology Code available for payment source | Source: Ambulatory Visit | Attending: Internal Medicine | Admitting: Internal Medicine

## 2021-02-07 ENCOUNTER — Other Ambulatory Visit: Payer: Self-pay

## 2021-02-07 DIAGNOSIS — C3490 Malignant neoplasm of unspecified part of unspecified bronchus or lung: Secondary | ICD-10-CM | POA: Diagnosis present

## 2021-02-07 IMAGING — CT CT CHEST W/ CM
1 of 3 series · 13 of 31 positions shown, 17 images · IV contrast (APPLIED)
Comparison: Most recent CT chest, abdomen and pelvis [DATE].

CLINICAL DATA: Primary Cancer Type: Lung
TECHNIQUE: Multidetector CT imaging of the chest, abdomen and pelvis was
performed following the standard protocol during bolus
administration of intravenous contrast.

CONTRAST:  75mL OMNIPAQUE IOHEXOL 350 MG/ML SOLN

[Series 4: lung · axial · 0.66mm/px · z∈[+1445,+1745]mm · 13 of 166 slices shown, 17 images]
[im 8/166  mediastinal]
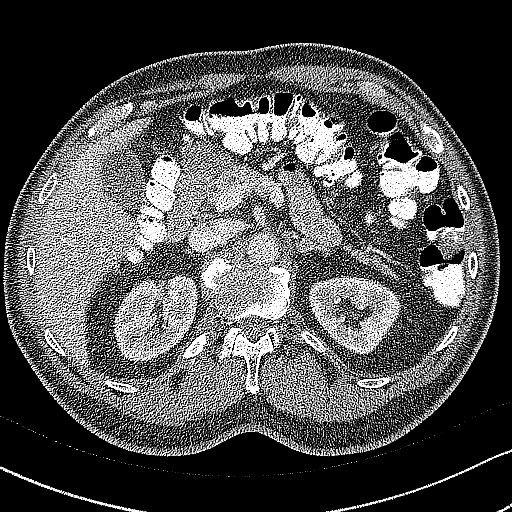
[im 8/166  lung]
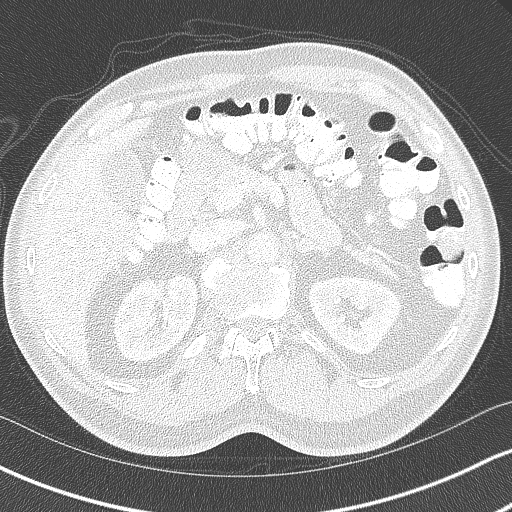
[im 23/166  lung]
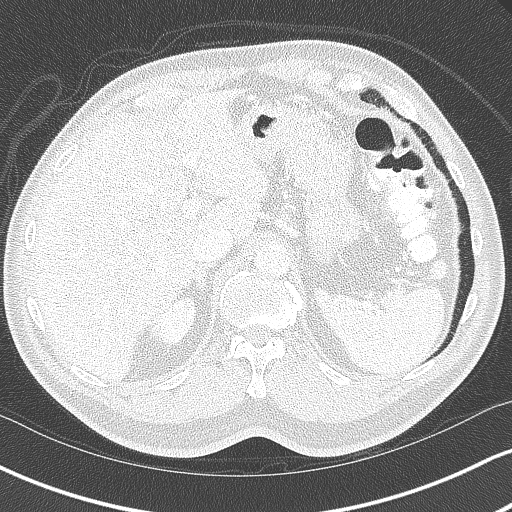
[im 38/166  lung]
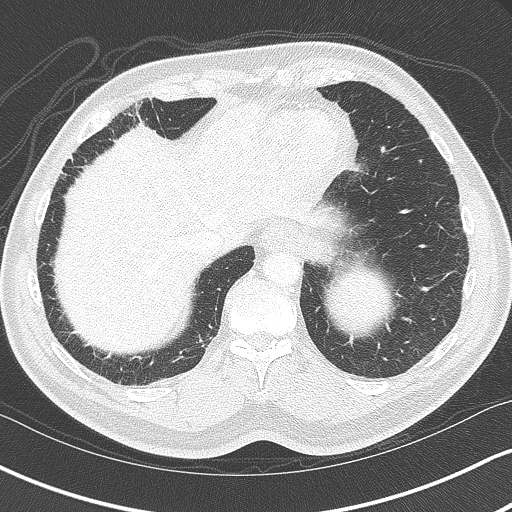
[im 46/166  lung]
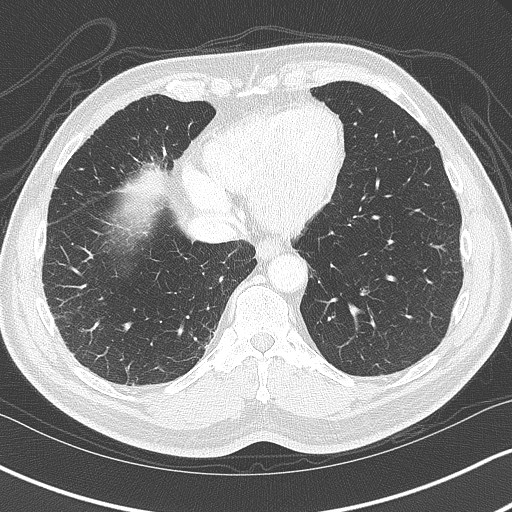
[im 61/166  mediastinal]
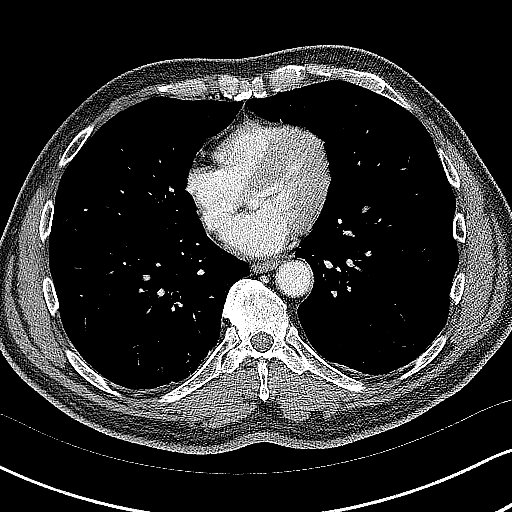
[im 61/166  lung]
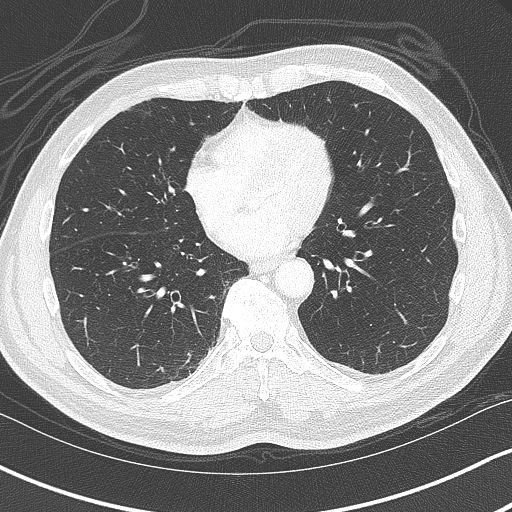
[im 68/166  lung]
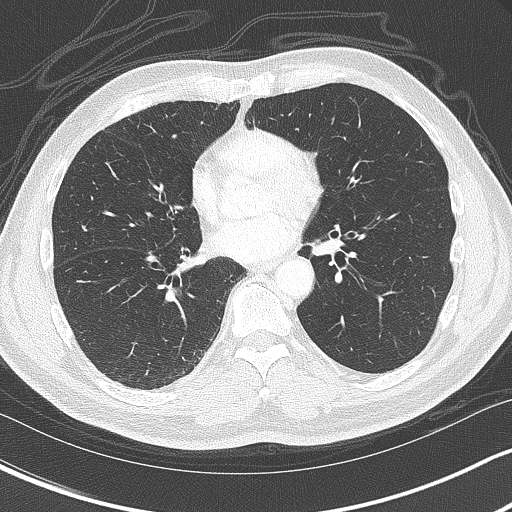
[im 83/166  lung]
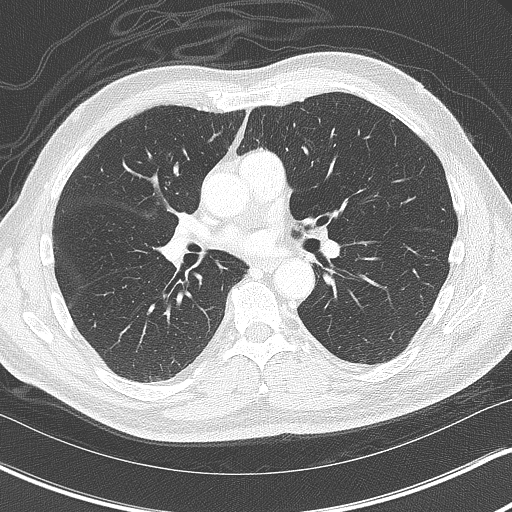
[im 98/166  lung]
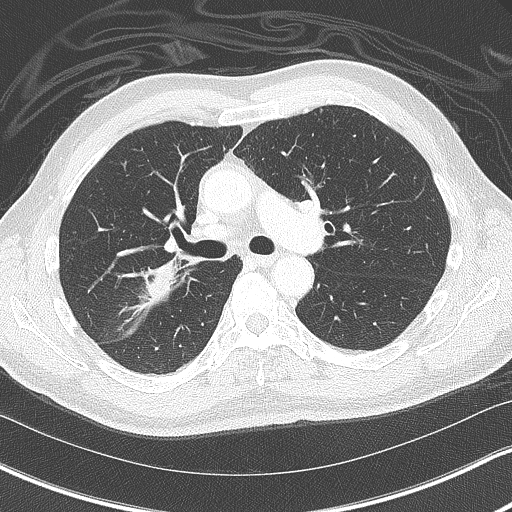
[im 106/166  mediastinal]
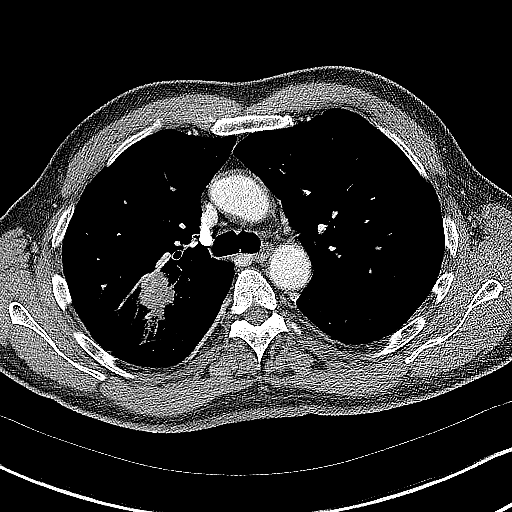
[im 106/166  lung]
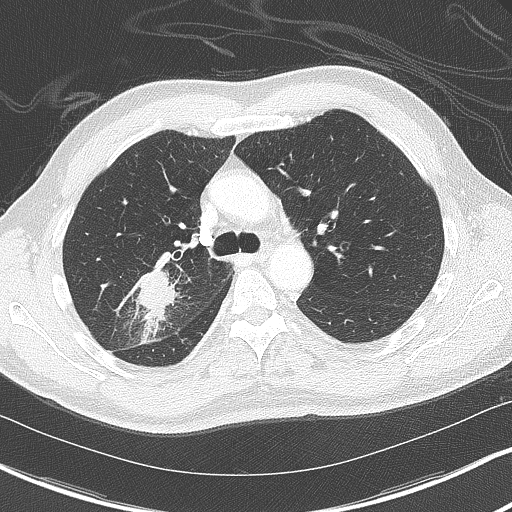
[im 121/166  lung]
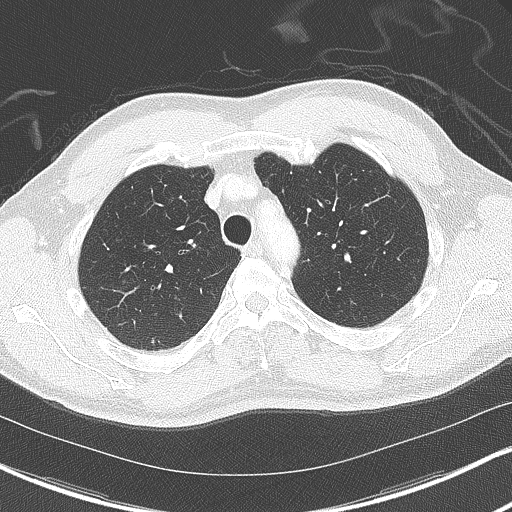
[im 128/166  lung]
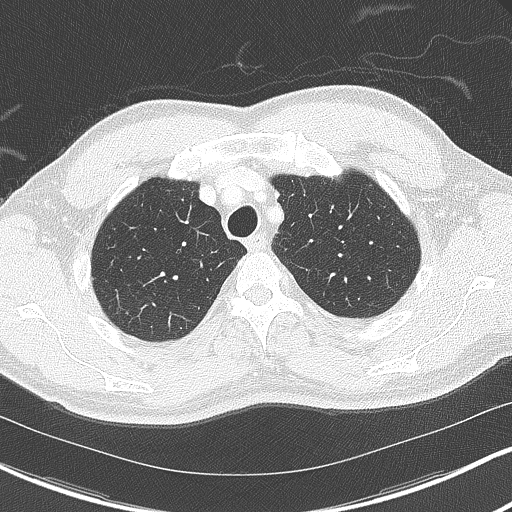
[im 143/166  lung]
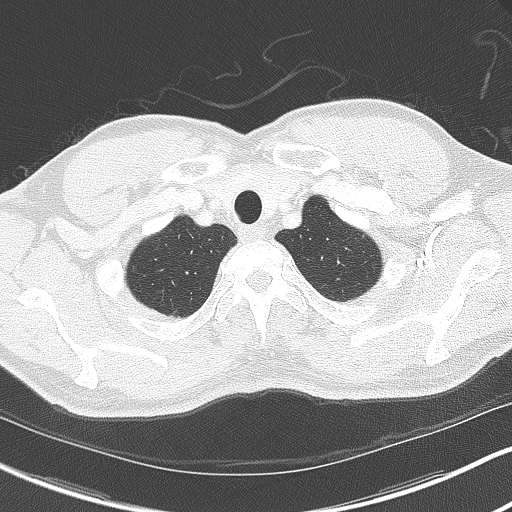
[im 158/166  mediastinal]
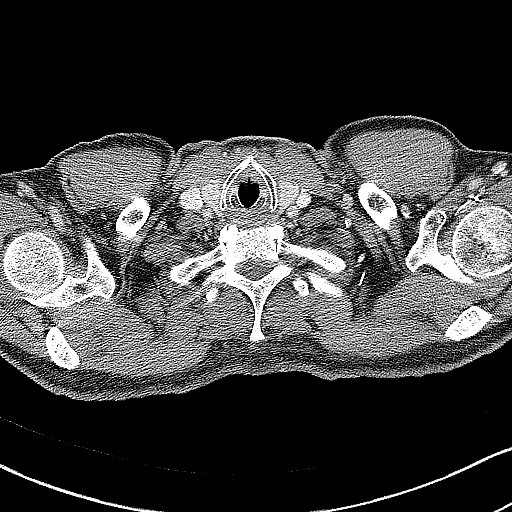
[im 158/166  lung]
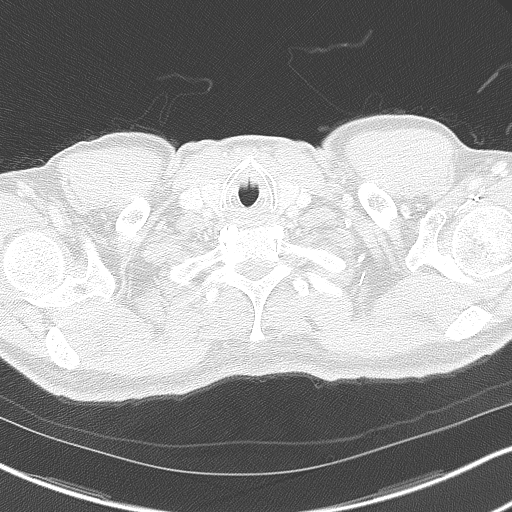

[13 of 31 positions shown; findings below may reference images not displayed]

Imaging Indication: Assess response to therapy

Interval therapy since last imaging? Yes

Initial Cancer Diagnosis

Date: [DATE]; Established by: Presumed

Detailed Pathology: Stage IV non-small cell lung cancer, favored to
be adenocarcinoma.

Primary Tumor location:   Right upper lobe.  Metastases to brain.

Surgeries: No.

Chemotherapy: Yes; Ongoing? No; Most recent administration:
[DATE]

Immunotherapy?  Yes; Type: Keytruda; Ongoing? Yes

Radiation therapy?  Yes; Date Range: [DATE]; Target: Brain

EXAM:
CT CHEST, ABDOMEN, AND PELVIS WITH CONTRAST
FINDINGS: CT CHEST FINDINGS

Cardiovascular: The heart is normal in size. No pericardial
effusion. The aorta is normal in caliber. Stable mild
atherosclerotic calcifications. The branch vessels are patent.
Stable scattered coronary artery calcifications.

Mediastinum/Nodes: No mediastinal hilar mass lymphadenopathy.
Right-sided tracheal debris is noted. The esophagus is grossly
normal.

Lungs/Pleura: Stable underlying emphysematous changes. No acute
pulmonary findings. The posterior right upper lobe lung lesion is
significantly larger. It measures approximately 4.7 x 3.2 cm on
image 64/4 and previously measured 2.7 x 2.1 cm. Findings worrisome
for recurrent/progressive tumor. PET-CT may be helpful for further
evaluation.

Stable small sub solid nodule in the left upper lobe on image number
56/4. This measures 5 mm.

A few tiny the sub 3 mm scattered pulmonary nodules are stable. No
new or progressive findings.

No acute pulmonary process. No pleural effusions or pleural nodules.

Musculoskeletal: No chest wall mass, supraclavicular or axillary
adenopathy.

Stable 14 mm left thyroid nodule which has been previously addressed
and biopsy. This has been evaluated on previous imaging. (ref: [HOSPITAL]. [DATE]): 143-50).

CT ABDOMEN PELVIS FINDINGS

Hepatobiliary: No worrisome hepatic lesions to suggest metastatic
disease. The gallbladder is unremarkable. No common bile duct
dilatation.

Pancreas: No mass, inflammation or ductal dilatation.

Spleen: Normal size.  No focal lesions.

Adrenals/Urinary Tract: The adrenal glands normal.

No renal lesions or hydronephrosis.  The bladder is unremarkable.

Stomach/Bowel: Stomach, duodenum, small bowel and colon are
unremarkable. No acute inflammatory changes, mass lesions or
obstructive findings.

Vascular/Lymphatic: Stable scattered aortic and iliac artery
calcifications but no aneurysm or dissection. No mesenteric or
retroperitoneal mass or adenopathy.

Reproductive: The prostate gland and seminal vesicles are
unremarkable stable.

Other: No pelvic mass or adenopathy. No free pelvic fluid
collections. No inguinal mass or adenopathy. No abdominal wall
hernia or subcutaneous lesions.

Musculoskeletal: No significant bony findings. No lytic or sclerotic
bone lesions are identified. The SI joints are largely fused.
IMPRESSION: 1. Interval enlargement of the posterior right upper lobe lung
lesion worrisome for recurrent/progressive tumor. Recommend PET-CT
for further evaluation.
2. No mediastinal or hilar mass or adenopathy.
3. Stable emphysematous changes and pulmonary scarring.
4. Stable 5 mm sub solid left upper lobe nodule.
5. No findings for abdominal/pelvic metastatic disease.
6. Emphysema and aortic atherosclerosis.

Aortic Atherosclerosis ([N9]-[N9]) and Emphysema ([N9]-[N9]).

## 2021-02-07 IMAGING — CT CT ABD-PELV W/ CM
2 of 5 series · 12 of 36 positions shown, 15 images · IV contrast (APPLIED)
Comparison: Most recent CT chest, abdomen and pelvis [DATE].

CLINICAL DATA: Primary Cancer Type: Lung
TECHNIQUE: Multidetector CT imaging of the chest, abdomen and pelvis was
performed following the standard protocol during bolus
administration of intravenous contrast.

CONTRAST:  75mL OMNIPAQUE IOHEXOL 350 MG/ML SOLN

[Series 2: cap with · axial · 0.79mm/px · z∈[+1171,+1691]mm · 9 of 132 slices shown, 12 images]
[im 14/132  mediastinal]
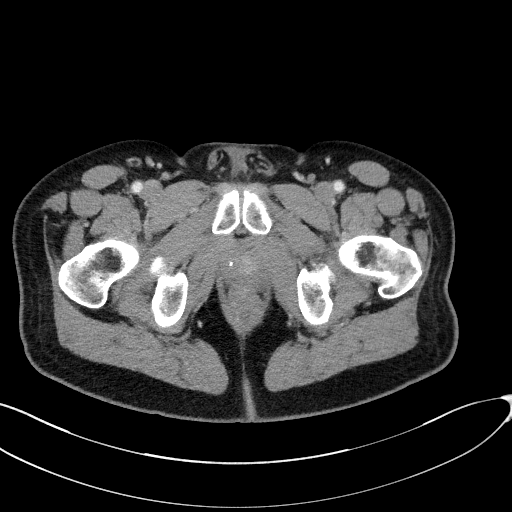
[im 14/132  lung]
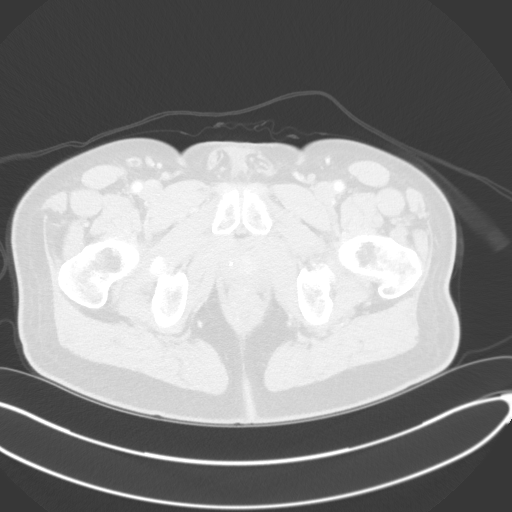
[im 27/132  lung]
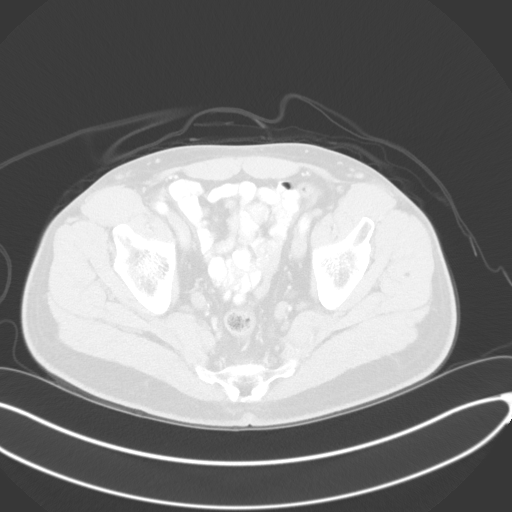
[im 40/132  lung]
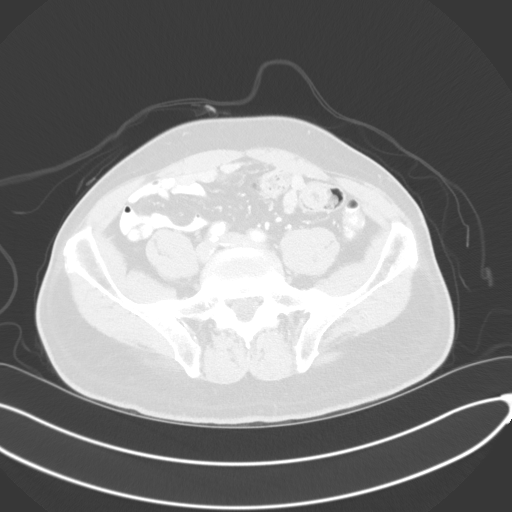
[im 53/132  lung]
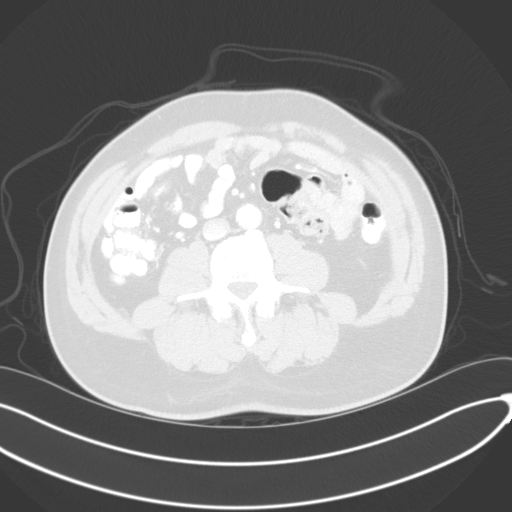
[im 66/132  mediastinal]
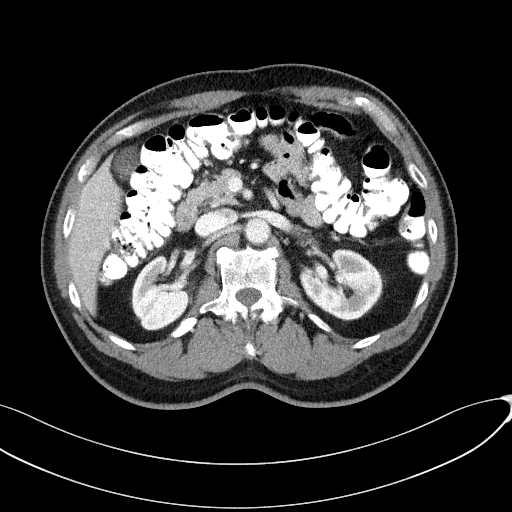
[im 66/132  lung]
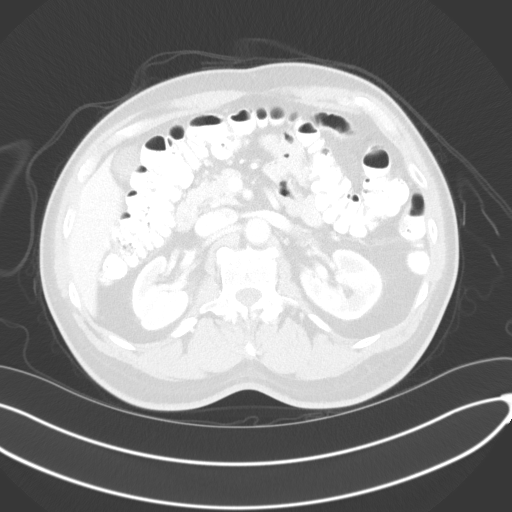
[im 79/132  lung]
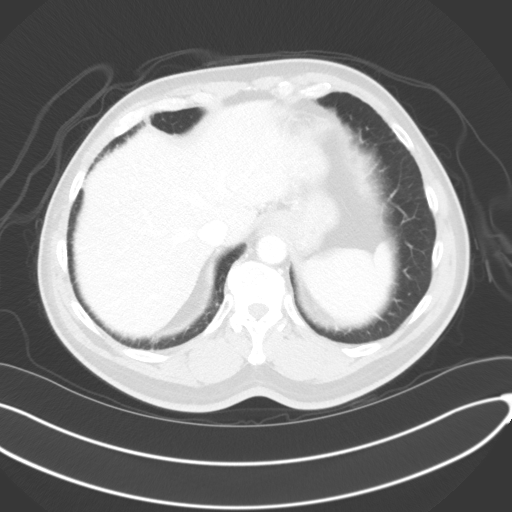
[im 92/132  lung]
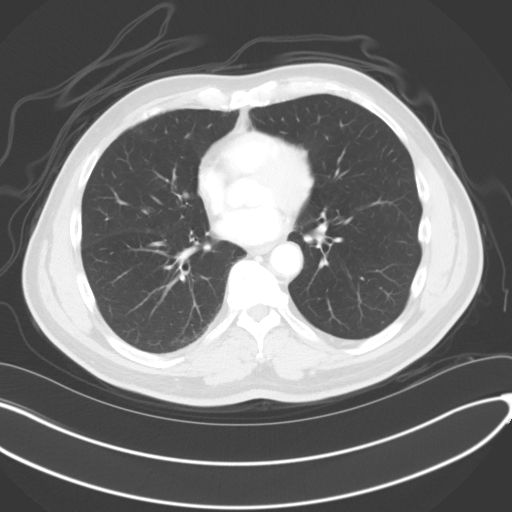
[im 105/132  lung]
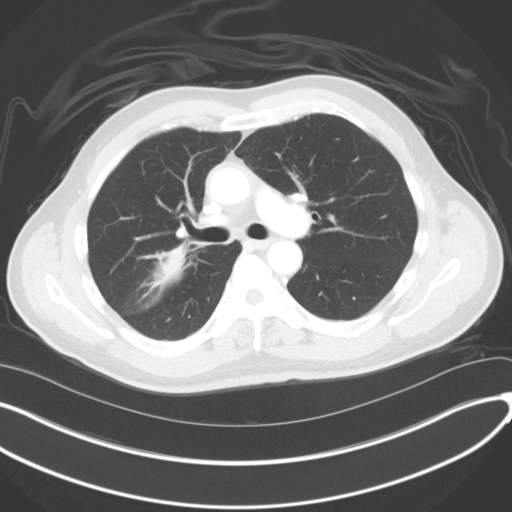
[im 118/132  mediastinal]
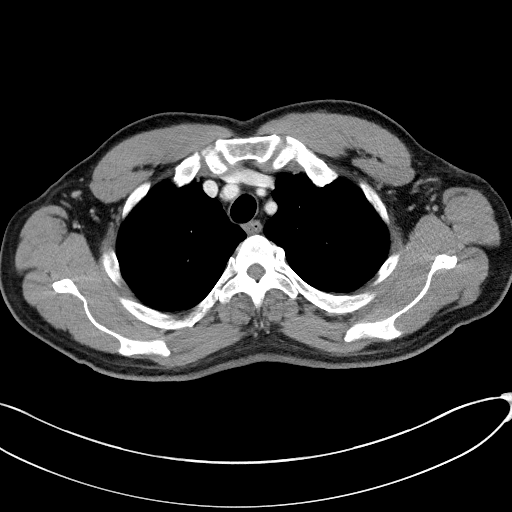
[im 118/132  lung]
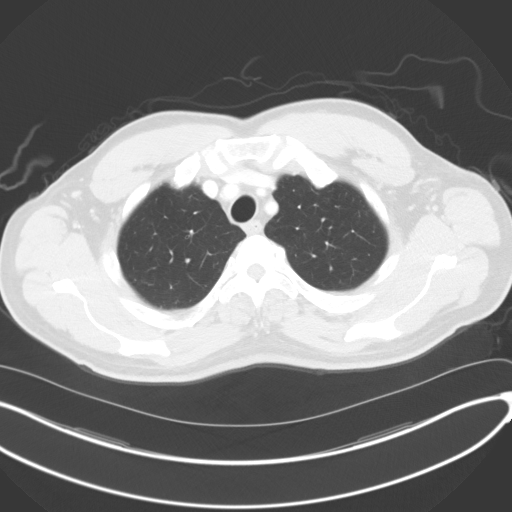

[Series 5: coronals · coronal · 0.82mm/px · 3 of 145 slices shown]
[im 29/145  lung]
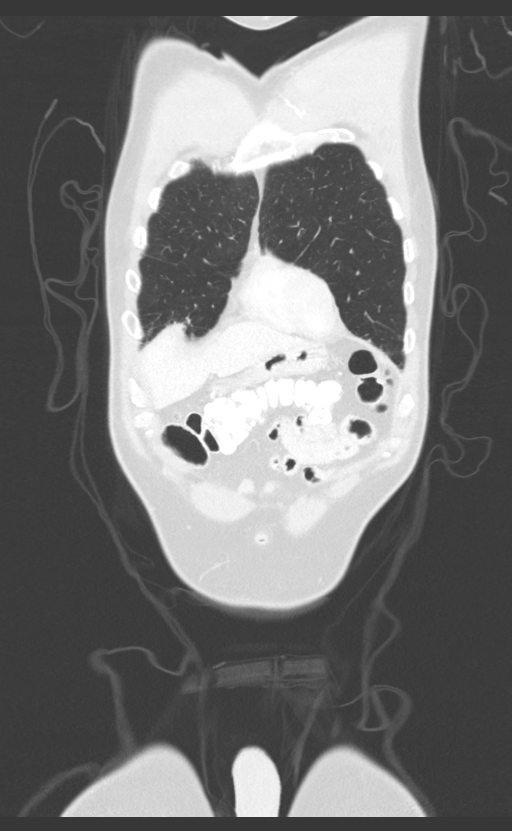
[im 58/145  lung]
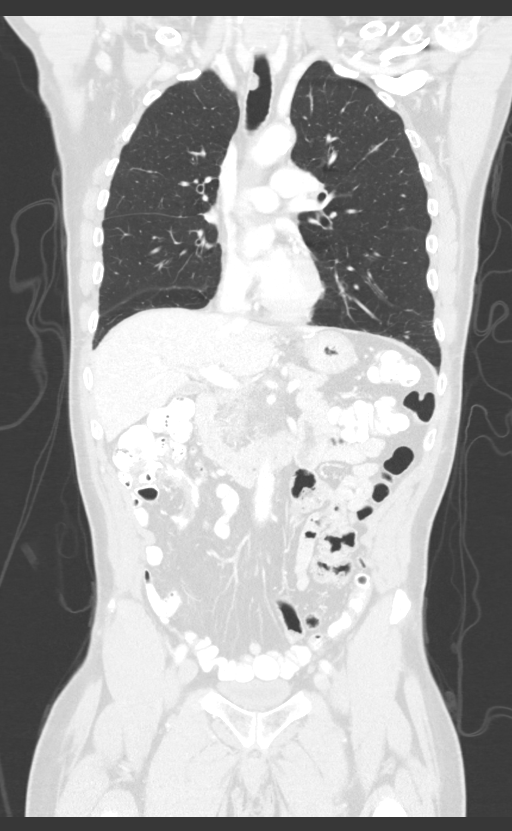
[im 87/145  lung]
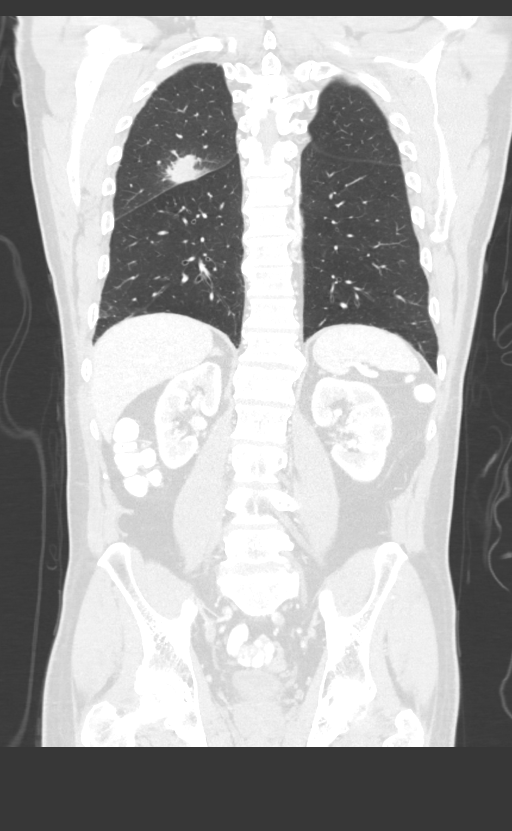

[12 of 36 positions shown; findings below may reference images not displayed]

Imaging Indication: Assess response to therapy

Interval therapy since last imaging? Yes

Initial Cancer Diagnosis

Date: [DATE]; Established by: Presumed

Detailed Pathology: Stage IV non-small cell lung cancer, favored to
be adenocarcinoma.

Primary Tumor location:   Right upper lobe.  Metastases to brain.

Surgeries: No.

Chemotherapy: Yes; Ongoing? No; Most recent administration:
[DATE]

Immunotherapy?  Yes; Type: Keytruda; Ongoing? Yes

Radiation therapy?  Yes; Date Range: [DATE]; Target: Brain

EXAM:
CT CHEST, ABDOMEN, AND PELVIS WITH CONTRAST
FINDINGS: CT CHEST FINDINGS

Cardiovascular: The heart is normal in size. No pericardial
effusion. The aorta is normal in caliber. Stable mild
atherosclerotic calcifications. The branch vessels are patent.
Stable scattered coronary artery calcifications.

Mediastinum/Nodes: No mediastinal hilar mass lymphadenopathy.
Right-sided tracheal debris is noted. The esophagus is grossly
normal.

Lungs/Pleura: Stable underlying emphysematous changes. No acute
pulmonary findings. The posterior right upper lobe lung lesion is
significantly larger. It measures approximately 4.7 x 3.2 cm on
image 64/4 and previously measured 2.7 x 2.1 cm. Findings worrisome
for recurrent/progressive tumor. PET-CT may be helpful for further
evaluation.

Stable small sub solid nodule in the left upper lobe on image number
56/4. This measures 5 mm.

A few tiny the sub 3 mm scattered pulmonary nodules are stable. No
new or progressive findings.

No acute pulmonary process. No pleural effusions or pleural nodules.

Musculoskeletal: No chest wall mass, supraclavicular or axillary
adenopathy.

Stable 14 mm left thyroid nodule which has been previously addressed
and biopsy. This has been evaluated on previous imaging. (ref: [HOSPITAL]. [DATE]): 143-50).

CT ABDOMEN PELVIS FINDINGS

Hepatobiliary: No worrisome hepatic lesions to suggest metastatic
disease. The gallbladder is unremarkable. No common bile duct
dilatation.

Pancreas: No mass, inflammation or ductal dilatation.

Spleen: Normal size.  No focal lesions.

Adrenals/Urinary Tract: The adrenal glands normal.

No renal lesions or hydronephrosis.  The bladder is unremarkable.

Stomach/Bowel: Stomach, duodenum, small bowel and colon are
unremarkable. No acute inflammatory changes, mass lesions or
obstructive findings.

Vascular/Lymphatic: Stable scattered aortic and iliac artery
calcifications but no aneurysm or dissection. No mesenteric or
retroperitoneal mass or adenopathy.

Reproductive: The prostate gland and seminal vesicles are
unremarkable stable.

Other: No pelvic mass or adenopathy. No free pelvic fluid
collections. No inguinal mass or adenopathy. No abdominal wall
hernia or subcutaneous lesions.

Musculoskeletal: No significant bony findings. No lytic or sclerotic
bone lesions are identified. The SI joints are largely fused.
IMPRESSION: 1. Interval enlargement of the posterior right upper lobe lung
lesion worrisome for recurrent/progressive tumor. Recommend PET-CT
for further evaluation.
2. No mediastinal or hilar mass or adenopathy.
3. Stable emphysematous changes and pulmonary scarring.
4. Stable 5 mm sub solid left upper lobe nodule.
5. No findings for abdominal/pelvic metastatic disease.
6. Emphysema and aortic atherosclerosis.

Aortic Atherosclerosis ([N9]-[N9]) and Emphysema ([N9]-[N9]).

## 2021-02-07 MED ORDER — IOHEXOL 350 MG/ML SOLN
100.0000 mL | Freq: Once | INTRAVENOUS | Status: AC | PRN
  Administered 2021-02-07: 75 mL via INTRAVENOUS

## 2021-02-18 ENCOUNTER — Ambulatory Visit
Admission: RE | Admit: 2021-02-18 | Discharge: 2021-02-18 | Disposition: A | Discharge status: Home / Self Care | Payer: No Typology Code available for payment source | Source: Ambulatory Visit | Attending: Internal Medicine | Admitting: Internal Medicine

## 2021-02-18 ENCOUNTER — Other Ambulatory Visit: Payer: Self-pay

## 2021-02-18 DIAGNOSIS — C7931 Secondary malignant neoplasm of brain: Secondary | ICD-10-CM

## 2021-02-18 IMAGING — MR MR HEAD WO/W CM
12 series · 48 of 48 positions shown · IV contrast (17 ml multihance)
Comparison: [DATE]

CLINICAL DATA: Lung cancer follow-up

EXAM:
MRI HEAD WITHOUT AND WITH CONTRAST
TECHNIQUE: Multiplanar, multiecho pulse sequences of the brain and surrounding
structures were obtained without and with intravenous contrast.
CONTRAST:  17mL MULTIHANCE GADOBENATE DIMEGLUMINE 529 MG/ML IV SOLN

[Series 2: FLAIR · sagittal · 3.0mm · 0.75mm/px · 3 of 39 slices shown (1 of 2)]
[im 1/39]
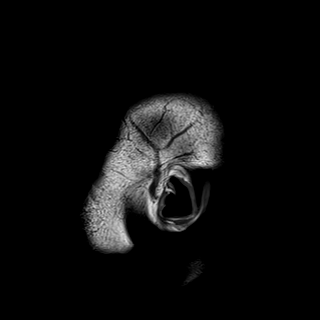
[im 20/39]
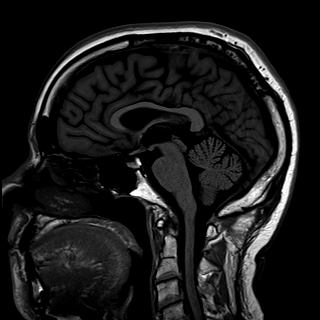
[im 39/39]
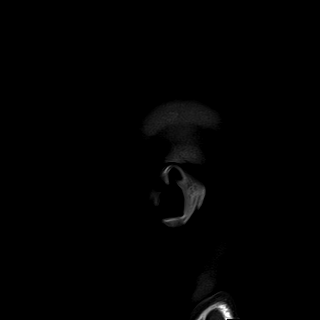

[Series 3: DWI · axial · 3.0mm · 1.50mm/px · z∈[-73,+76]mm · 4 of 78 slices shown (1 of 2)]
[im 1/78]
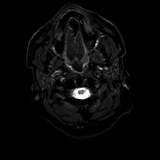
[im 26/78]
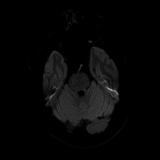
[im 52/78]
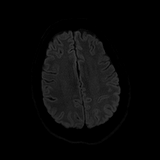
[im 78/78]
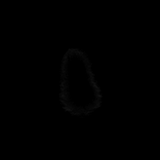

[Series 4: DWI · axial · 3.0mm · 1.50mm/px · z∈[-73,+72]mm · 2 of 38 slices shown (2 of 2)]
[im 1/38]
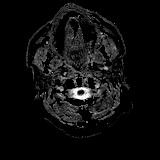
[im 38/38]
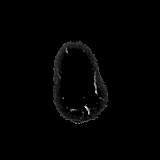

[Series 5: T2 · axial · 5.0mm · 0.57mm/px · 1 of 27 slices shown (1 of 2)]
[im 1/27]
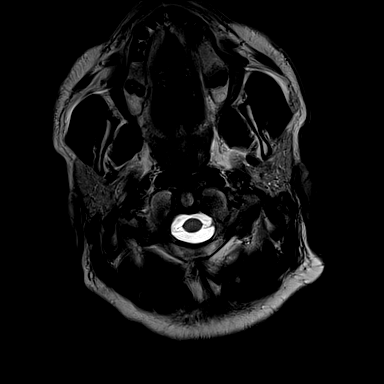

[Series 6: swi_images · axial · 1.5mm · 0.90mm/px · z∈[-70,+73]mm · 5 of 96 slices shown]
[im 1/96]
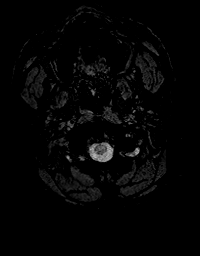
[im 24/96]
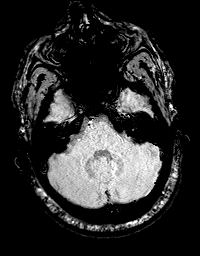
[im 48/96]
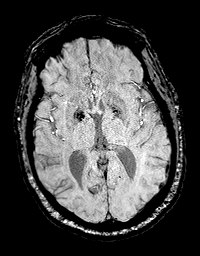
[im 72/96]
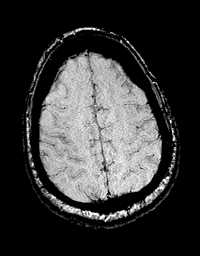
[im 96/96]
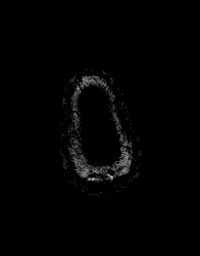

[Series 8: FLAIR · axial · 3.0mm · 0.86mm/px · z∈[-94,+83]mm · 3 of 60 slices shown (2 of 2)]
[im 1/60]
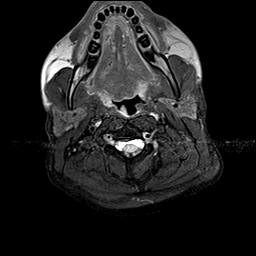
[im 30/60]
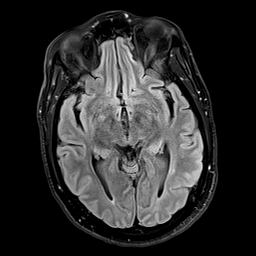
[im 60/60]
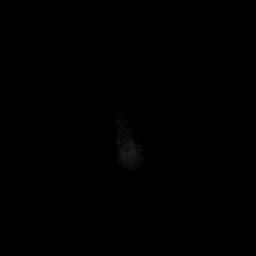

[Series 9: T2 · axial · non-contrast · 1.0mm · 0.86mm/px · z∈[-76,+79]mm · 8 of 160 slices shown (2 of 2)]
[im 1/160]
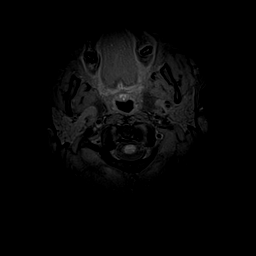
[im 23/160]
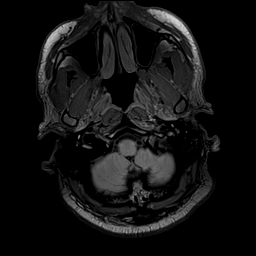
[im 46/160]
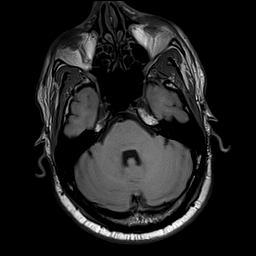
[im 69/160]
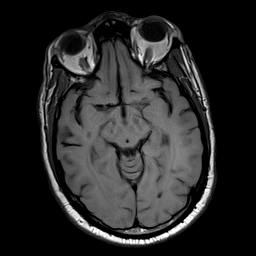
[im 91/160]
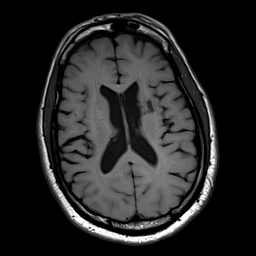
[im 114/160]
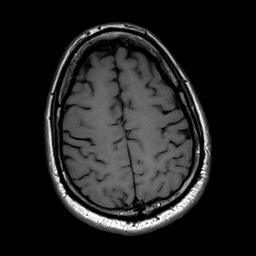
[im 137/160]
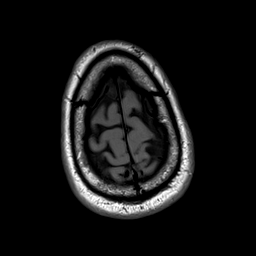
[im 160/160]
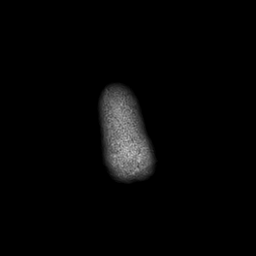

[Series 10: T2 post-contrast · coronal · 3.0mm · 0.57mm/px · 2 of 47 slices shown (1 of 2)]
[im 1/47]
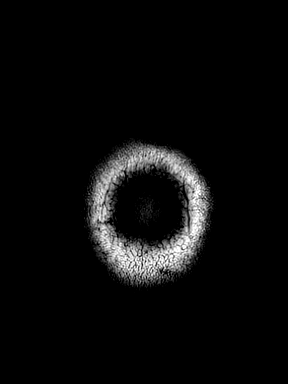
[im 47/47]
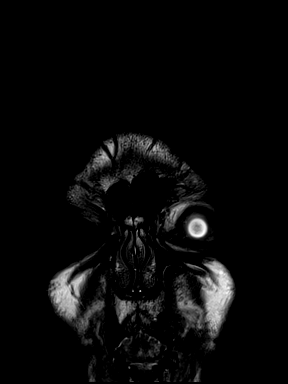

[Series 11: T2 post-contrast · axial · 1.0mm · 0.86mm/px · z∈[-76,+79]mm · 8 of 160 slices shown (2 of 2)]
[im 1/160]
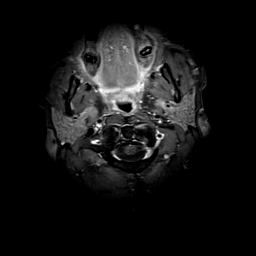
[im 23/160]
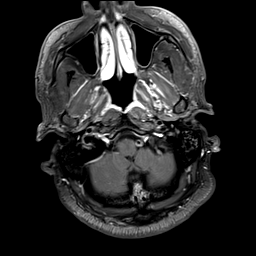
[im 46/160]
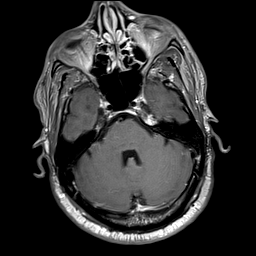
[im 69/160]
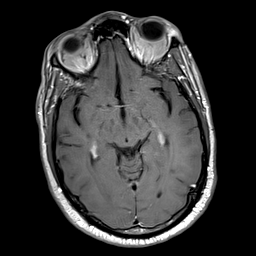
[im 91/160]
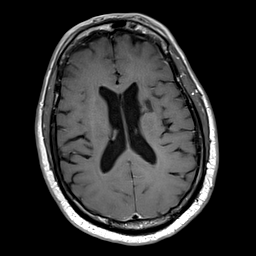
[im 114/160]
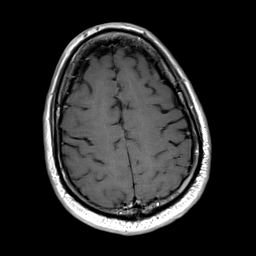
[im 137/160]
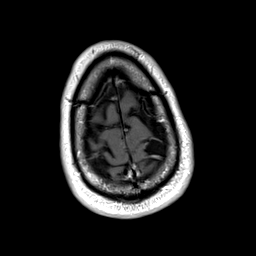
[im 160/160]
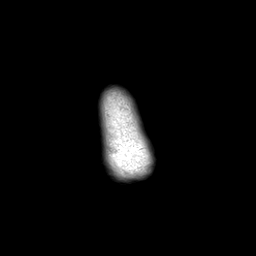

[Series 12: T1 post-contrast · axial · 1.0mm · 0.75mm/px · z∈[-78,+81]mm · 8 of 160 slices shown (1 of 2)]
[im 1/160]
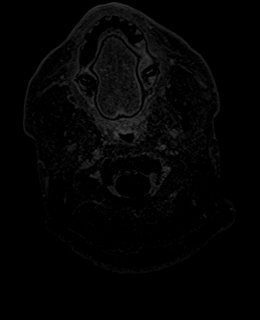
[im 23/160]
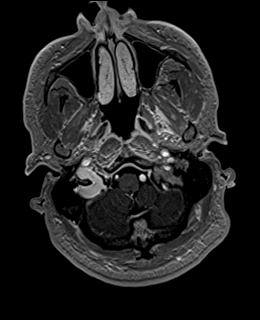
[im 46/160]
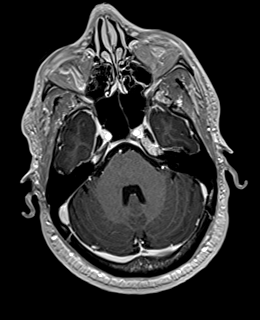
[im 69/160]
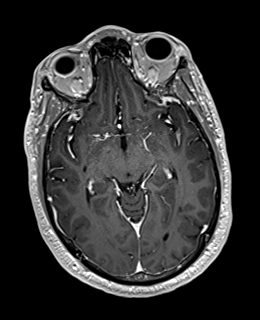
[im 91/160]
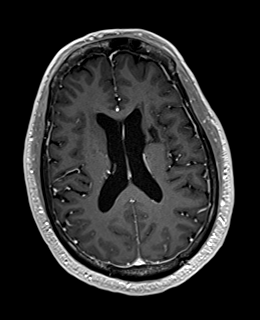
[im 114/160]
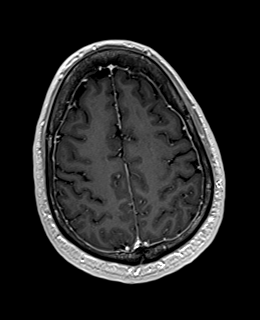
[im 137/160]
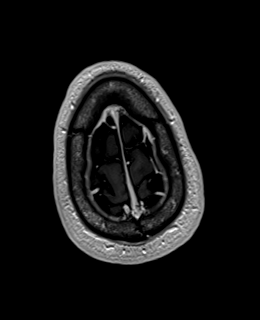
[im 160/160]
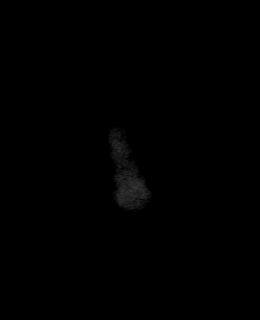

[Series 13: T1 post-contrast · coronal · 3.0mm · 0.57mm/px · 2 of 47 slices shown (2 of 2)]
[im 1/47]
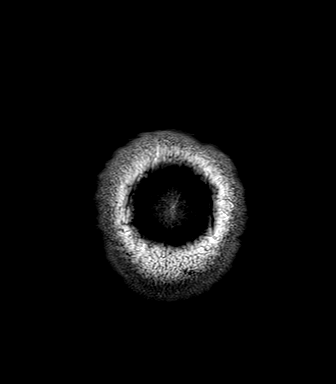
[im 47/47]
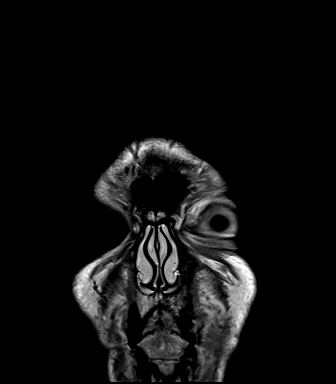

[Series 14: FLAIR post-contrast · sagittal · 3.0mm · 0.75mm/px · 2 of 39 slices shown]
[im 1/39]
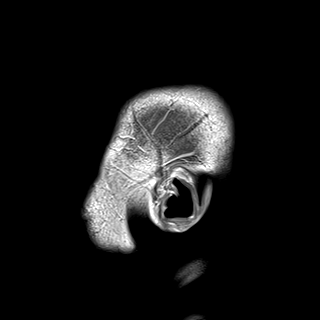
[im 39/39]
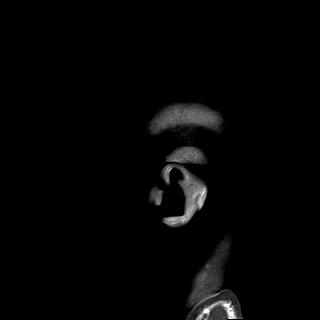

[48 of 48 positions shown; findings below may reference images not displayed]

FINDINGS: BRAIN

New Lesions: None.

Larger lesions: None.

Stable or Smaller lesions: High left frontal dot of enhancement only
seen on sagittal postcontrast imaging is a site of previously
treated disease that is stable. The questioned right perirolandic
metastasis on prior is not re-identified.

Other Brain findings: Remote bilateral subcortical perforator
infarcts at the basal ganglia/deep white matter tracks. Mild chronic
small vessel ischemic change in the hemispheric white matter. Brain
volume is normal. No hydrocephalus or collection.

Vascular: Normal flow voids and vascular enhancements

Skull and upper cervical spine: Normal marrow signal. C2-3 non
segmentation.

Sinuses/Orbits: Negative
IMPRESSION: Treated metastatic disease without new or increased lesion.

## 2021-02-18 MED ORDER — GADOBENATE DIMEGLUMINE 529 MG/ML IV SOLN
17.0000 mL | Freq: Once | INTRAVENOUS | Status: AC | PRN
  Administered 2021-02-18: 17 mL via INTRAVENOUS

## 2021-02-18 NOTE — Progress Notes (Deleted)
[place] [address]  DIAGNOSIS: Stage IV non-small cell lung cancer, favored to be adenocarcinoma.  He presented with posterior right upper lobe nodule as well as right hilar, infrahilar, subcarinal, and right paratracheal lymphadenopathy.  He also has a 1.4 cm x 1 cm soft tissue nodule in the skin/subcutaneous fat in the left axilla.  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  He was diagnosed in August 2021   Molecular Biomarkers: BIOMARKER(S)         % CFDNA OR AMPLIFICATION       ASSOCIATED FDA-APPROVED THERAPIES        CLINICAL TRIAL AVAILABILITY [id number] 2.9% None    Yes [id number] 0.4% None    Yes [id number] 0.2% None    Yes    PRIOR THERAPY: SRS to the small metastatic brain lesion under the care of Dr. [name] on 03/18/20  CURRENT THERAPY: Palliative systemic chemotherapy with carboplatin for an AUC of 5, Alimta 500 mg/m2, and Keytruda 200 mg IV every 3 weeks. First dose expected on 04/07/20. Status post 15 cycles. Starting from cycle #5 he will be on maintenance treatment with Alimta and Keytruda every 3 weeks. His dose of Alimta was reduced to 400 mg/m2. Alimta removed starting from cycle #11 due to renal insuffiencey.   INTERVAL HISTORY: Orien Mayhall 65 y.o. male returns  to the clinic today for a follow up visit. The patient is feeling well today without any concerning complaints except for fatigue. He is tolerating his treatment well without any adverse side effects. Alimta was removed from his treatment plan due to renal insufficiency. He is currently undergoing single agent immunotherapy with Keytruda. He recently had a telephone visit with nutrition due to weight loss and decreased appetite. He did gain a few pounds since his appointment. He has been drinking 2 ensures per day. He also has been trying to be more active with doing push-ups and  sit ups. He denies fevers, chills, recent night sweats, or shortness of breath. He believes he may have been coughing a little bit more. He produces brown/tan mucus. He does not take anything for cough. Denies sore throat or sick contacts. He denies nausea, vomiting, diarrhea, or constipation. Denies chest pain or hemoptysis. He denies rashes or skin changes. He denies headaches or visual changes. He is followed by neuro-oncology for his history of metastatic disease to the brain. He recently had a repeat brain MRI and a follow up with ***.  He recently had a restaging CT scan.  He is here for evaluation and to review his scan results before starting cycle #16.      MEDICAL HISTORY: Past Medical History:  Diagnosis Date   Asthma    as a child   Chronic pain disorder    LT leg and foot   Heart murmur    as a child   Hypertension    Malignant neoplasm of upper lobe of right lung ([place])    Stroke ([place])    mini stroke - 2015, some tingling in fingers on right     ALLERGIES:  has No Known Allergies.  MEDICATIONS:  Current Outpatient Medications  Medication Sig Dispense Refill   amLODipine (NORVASC) 10 MG tablet Take 10 mg by mouth daily.     Cholecalciferol 100 MCG (4000 UT) TABS Take 2 tablets by mouth daily.     doxylamine, Sleep, (UNISOM) 25 MG  tablet Take 25 mg by mouth at bedtime as needed for sleep.     folic acid (FOLVITE) 1 MG tablet Take 1 tablet (1 mg total) by mouth daily. 30 tablet 2   HYDROcodone-acetaminophen (NORCO) 10-325 MG tablet Take 1 tablet by mouth 2 (two) times daily.     oxybutynin (DITROPAN-XL) 10 MG 24 hr tablet Take by mouth.     phenazopyridine (PYRIDIUM) 200 MG tablet Take 1 tablet (200 mg total) by mouth 3 (three) times daily. 6 tablet 0   polyethylene glycol powder (GLYCOLAX/MIRALAX) 17 GM/SCOOP powder TAKE 17 GRAMS BY MOUTH DAILY (MIX WITH WATER OR OTHER LIQUID AS INSTRUCTED BEFORE DRINKING)     prochlorperazine (COMPAZINE) 10 MG tablet Take 1 tablet (10 mg  total) by mouth every 6 (six) hours as needed. 30 tablet 2   rivaroxaban (XARELTO) 20 MG TABS tablet TAKE 1 TABLET BY MOUTH ONCE A DAY WITH SUPPER 30 tablet 2   senna-docusate (SENOKOT-S) 8.6-50 MG tablet Take 2 tablets by mouth daily.     tadalafil (CIALIS) 20 MG tablet Take by mouth.     tamsulosin (FLOMAX) 0.4 MG CAPS capsule Take 1 capsule (0.4 mg total) by mouth daily. 30 capsule 2   temazepam (RESTORIL) 15 MG capsule Take 1 capsule (15 mg total) by mouth at bedtime as needed for sleep. Pt is veteran - Call pt to set up delivery 30 capsule 0   No current facility-administered medications for this visit.    SURGICAL HISTORY:  Past Surgical History:  Procedure Laterality Date   BUNIONECTOMY Left    had hammer toe surgery also and callus removed   BUNIONECTOMY Right    also had hammer toe surgery and callus removed   LEG SURGERY Left    PT reports he has pins placed in his Lt leg   ORIF TIBIA PLATEAU  10/09/2012   Dr Lorin Mercy   ORIF TIBIA PLATEAU Left 10/08/2012   Procedure: OPEN REDUCTION INTERNAL FIXATION (ORIF) TIBIAL PLATEAU;  Surgeon: Marybelle Killings, MD;  Location: [facility];  Service: Orthopedics;  Laterality: Left;   VIDEO BRONCHOSCOPY WITH ENDOBRONCHIAL NAVIGATION N/A 02/25/2020   Procedure: VIDEO BRONCHOSCOPY WITH ENDOBRONCHIAL NAVIGATION with biopsies;  Surgeon: Collene Gobble, MD;  Location: [facility];  Service: Thoracic;  Laterality: N/A;   VIDEO BRONCHOSCOPY WITH ENDOBRONCHIAL ULTRASOUND N/A 02/25/2020   Procedure: VIDEO BRONCHOSCOPY WITH ENDOBRONCHIAL ULTRASOUND;  Surgeon: Collene Gobble, MD;  Location: [facility];  Service: Thoracic;  Laterality: N/A;    REVIEW OF SYSTEMS:   Review of Systems  Constitutional: Negative for appetite change, chills, fatigue, fever and unexpected weight change.  HENT:   Negative for mouth sores, nosebleeds, sore throat and trouble swallowing.   Eyes: Negative for eye problems and icterus.  Respiratory: Negative for cough, hemoptysis, shortness of breath and  wheezing.   Cardiovascular: Negative for chest pain and leg swelling.  Gastrointestinal: Negative for abdominal pain, constipation, diarrhea, nausea and vomiting.  Genitourinary: Negative for bladder incontinence, difficulty urinating, dysuria, frequency and hematuria.   Musculoskeletal: Negative for back pain, gait problem, neck pain and neck stiffness.  Skin: Negative for itching and rash.  Neurological: Negative for dizziness, extremity weakness, gait problem, headaches, light-headedness and seizures.  Hematological: Negative for adenopathy. Does not bruise/bleed easily.  Psychiatric/Behavioral: Negative for confusion, depression and sleep disturbance. The patient is not nervous/anxious.     PHYSICAL EXAMINATION:  There were no vitals taken for this visit.  ECOG PERFORMANCE STATUS: {CHL ONC ECOG [id number]  Physical Exam  Constitutional: Oriented to person, place, and time and well-developed, well-nourished, and in no distress. No distress.  HENT:  Head: Normocephalic and atraumatic.  Mouth/Throat: Oropharynx is clear and moist. No oropharyngeal exudate.  Eyes: Conjunctivae are normal. Right eye exhibits no discharge. Left eye exhibits no discharge. No scleral icterus.  Neck: Normal range of motion. Neck supple.  Cardiovascular: Normal rate, regular rhythm, normal heart sounds and intact distal pulses.   Pulmonary/Chest: Effort normal and breath sounds normal. No respiratory distress. No wheezes. No rales.  Abdominal: Soft. Bowel sounds are normal. Exhibits no distension and no mass. There is no tenderness.  Musculoskeletal: Normal range of motion. Exhibits no edema.  Lymphadenopathy:    No cervical adenopathy.  Neurological: Alert and oriented to person, place, and time. Exhibits normal muscle tone. Gait normal. Coordination normal.  Skin: Skin is warm and dry. No rash noted. Not diaphoretic. No erythema. No pallor.  Psychiatric: Mood, memory and judgment normal.  Vitals  reviewed.  LABORATORY DATA: Lab Results  Component Value Date   WBC 5.5 02/02/2021   HGB 13.9 02/02/2021   HCT 41.3 02/02/2021   MCV 89.6 02/02/2021   PLT 171 02/02/2021      Chemistry      Component Value Date/Time   NA 141 02/02/2021 1007   K 3.9 02/02/2021 1007   CL 108 02/02/2021 1007   CO2 23 02/02/2021 1007   BUN 16 02/02/2021 1007   CREATININE 1.76 (H) 02/02/2021 1007      Component Value Date/Time   CALCIUM 9.7 02/02/2021 1007   ALKPHOS 108 02/02/2021 1007   AST 14 (L) 02/02/2021 1007   ALT 10 02/02/2021 1007   BILITOT 0.4 02/02/2021 1007       RADIOGRAPHIC STUDIES:  CT Chest W Contrast  Result Date: 02/08/2021 CLINICAL DATA:  Primary Cancer Type: Lung Imaging Indication: Assess response to therapy Interval therapy since last imaging? Yes Initial Cancer Diagnosis Date: 02/25/2020; Established by: Presumed Detailed Pathology: Stage IV non-small cell lung cancer, favored to be adenocarcinoma. 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 Surgeries: No. Chemotherapy: Yes; Ongoing? No; Most recent administration: 11/10/2020 Immunotherapy?  Yes; Type: Keytruda; Ongoing? Yes Radiation therapy?  Yes; Date Range: 03/18/2020; Target: Brain EXAM: CT CHEST, ABDOMEN, AND PELVIS WITH CONTRAST TECHNIQUE: Multidetector CT imaging of the chest, abdomen and pelvis was performed following the standard protocol during bolus administration of intravenous contrast. CONTRAST:  21mL OMNIPAQUE IOHEXOL 350 MG/ML SOLN COMPARISON:  Most recent CT chest, abdomen and pelvis 11/09/2020. FINDINGS: CT CHEST FINDINGS Cardiovascular: The heart is normal in size. No pericardial effusion. The aorta is normal in caliber. Stable mild atherosclerotic calcifications. The branch vessels are patent. Stable scattered coronary artery calcifications. Mediastinum/Nodes: No mediastinal hilar mass lymphadenopathy. Right-sided tracheal debris is noted. The esophagus is grossly normal. Lungs/Pleura:  Stable underlying emphysematous changes. No acute pulmonary findings. The posterior right upper lobe lung lesion is significantly larger. It measures approximately 4.7 x 3.2 cm on image 64/4 and previously measured 2.7 x 2.1 cm. Findings worrisome for recurrent/progressive tumor. PET-CT may be helpful for further evaluation. Stable small sub solid nodule in the left upper lobe on image number 56/4. This measures 5 mm. A few tiny the sub 3 mm scattered pulmonary nodules are stable. No new or progressive findings. No acute pulmonary process. No pleural effusions or pleural nodules. Musculoskeletal: No chest wall mass, supraclavicular or axillary adenopathy. Stable 14 mm left thyroid nodule which has been previously addressed and biopsy. This  has been evaluated on previous imaging. (ref: J Am Coll Radiol. 2015 Feb;12(2): 143-50). CT ABDOMEN PELVIS FINDINGS Hepatobiliary: No worrisome hepatic lesions to suggest metastatic disease. The gallbladder is unremarkable. No common bile duct dilatation. Pancreas: No mass, inflammation or ductal dilatation. Spleen: Normal size.  No focal lesions. Adrenals/Urinary Tract: The adrenal glands normal. No renal lesions or hydronephrosis.  The bladder is unremarkable. Stomach/Bowel: Stomach, duodenum, small bowel and colon are unremarkable. No acute inflammatory changes, mass lesions or obstructive findings. Vascular/Lymphatic: Stable scattered aortic and iliac artery calcifications but no aneurysm or dissection. No mesenteric or retroperitoneal mass or adenopathy. Reproductive: The prostate gland and seminal vesicles are unremarkable stable. Other: No pelvic mass or adenopathy. No free pelvic fluid collections. No inguinal mass or adenopathy. No abdominal wall hernia or subcutaneous lesions. Musculoskeletal: No significant bony findings. No lytic or sclerotic bone lesions are identified. The SI joints are largely fused. IMPRESSION: 1. Interval enlargement of the posterior right upper  lobe lung lesion worrisome for recurrent/progressive tumor. Recommend PET-CT for further evaluation. 2. No mediastinal or hilar mass or adenopathy. 3. Stable emphysematous changes and pulmonary scarring. 4. Stable 5 mm sub solid left upper lobe nodule. 5. No findings for abdominal/pelvic metastatic disease. 6. Emphysema and aortic atherosclerosis. Aortic Atherosclerosis (ICD10-I70.0) and Emphysema (ICD10-J43.9). Electronically Signed   By: Marijo Sanes M.D.   On: 02/08/2021 11:19   CT Abdomen Pelvis W Contrast  Result Date: 02/08/2021 CLINICAL DATA:  Primary Cancer Type: Lung Imaging Indication: Assess response to therapy Interval therapy since last imaging? Yes Initial Cancer Diagnosis Date: 02/25/2020; Established by: Presumed Detailed Pathology: Stage IV non-small cell lung cancer, favored to be adenocarcinoma. 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 Surgeries: No. Chemotherapy: Yes; Ongoing? No; Most recent administration: 11/10/2020 Immunotherapy?  Yes; Type: Keytruda; Ongoing? Yes Radiation therapy?  Yes; Date Range: 03/18/2020; Target: Brain EXAM: CT CHEST, ABDOMEN, AND PELVIS WITH CONTRAST TECHNIQUE: Multidetector CT imaging of the chest, abdomen and pelvis was performed following the standard protocol during bolus administration of intravenous contrast. CONTRAST:  64mL OMNIPAQUE IOHEXOL 350 MG/ML SOLN COMPARISON:  Most recent CT chest, abdomen and pelvis 11/09/2020. FINDINGS: CT CHEST FINDINGS Cardiovascular: The heart is normal in size. No pericardial effusion. The aorta is normal in caliber. Stable mild atherosclerotic calcifications. The branch vessels are patent. Stable scattered coronary artery calcifications. Mediastinum/Nodes: No mediastinal hilar mass lymphadenopathy. Right-sided tracheal debris is noted. The esophagus is grossly normal. Lungs/Pleura: Stable underlying emphysematous changes. No acute pulmonary findings. The posterior right upper lobe lung lesion is  significantly larger. It measures approximately 4.7 x 3.2 cm on image 64/4 and previously measured 2.7 x 2.1 cm. Findings worrisome for recurrent/progressive tumor. PET-CT may be helpful for further evaluation. Stable small sub solid nodule in the left upper lobe on image number 56/4. This measures 5 mm. A few tiny the sub 3 mm scattered pulmonary nodules are stable. No new or progressive findings. No acute pulmonary process. No pleural effusions or pleural nodules. Musculoskeletal: No chest wall mass, supraclavicular or axillary adenopathy. Stable 14 mm left thyroid nodule which has been previously addressed and biopsy. This has been evaluated on previous imaging. (ref: J Am Coll Radiol. 2015 Feb;12(2): 143-50). CT ABDOMEN PELVIS FINDINGS Hepatobiliary: No worrisome hepatic lesions to suggest metastatic disease. The gallbladder is unremarkable. No common bile duct dilatation. Pancreas: No mass, inflammation or ductal dilatation. Spleen: Normal size.  No focal lesions. Adrenals/Urinary Tract: The adrenal glands normal. No renal lesions or hydronephrosis.  The bladder is unremarkable. Stomach/Bowel: Stomach, duodenum, small bowel and colon are unremarkable. No acute inflammatory changes, mass lesions or obstructive findings. Vascular/Lymphatic: Stable scattered aortic and iliac artery calcifications but no aneurysm or dissection. No mesenteric or retroperitoneal mass or adenopathy. Reproductive: The prostate gland and seminal vesicles are unremarkable stable. Other: No pelvic mass or adenopathy. No free pelvic fluid collections. No inguinal mass or adenopathy. No abdominal wall hernia or subcutaneous lesions. Musculoskeletal: No significant bony findings. No lytic or sclerotic bone lesions are identified. The SI joints are largely fused. IMPRESSION: 1. Interval enlargement of the posterior right upper lobe lung lesion worrisome for recurrent/progressive tumor. Recommend PET-CT for further evaluation. 2. No  mediastinal or hilar mass or adenopathy. 3. Stable emphysematous changes and pulmonary scarring. 4. Stable 5 mm sub solid left upper lobe nodule. 5. No findings for abdominal/pelvic metastatic disease. 6. Emphysema and aortic atherosclerosis. Aortic Atherosclerosis (ICD10-I70.0) and Emphysema (ICD10-J43.9). Electronically Signed   By: Marijo Sanes M.D.   On: 02/08/2021 11:19     ASSESSMENT/PLAN:  This is a very pleasant 65 year old African-American male with stage IV non-small cell lung cancer, favored to be adenocarcinoma.  He presented with posterior right upper lobe nodule as well as right hilar, infrahilar, subcarinal, and right paratracheal lymphadenopathy.  He also has a 1.4 cm x 1 cm soft tissue nodule in the skin/subcutaneous fat in the left axilla.  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  He was diagnosed in August 2021.  Molecular studies by [facility] are negative for any actionable mutations.   The patient completed SRS to the small metastatic brain lesion under the care of Dr. [name] on 03/18/20   The patient is currently undergoing palliative systemic chemotherapy with carboplatin for an AUC of 5, Alimta 500 mg per metered squared, and Keytruda 200 mg IV every 3 weeks.  He is status post 15 cycles. The dose of alimta was reduced to 400 mg/m2 due to renal insufficiency. Alimta was removed from the treatment plan with cycle #11 due to renal insufficiency.  The patient recently had a restaging CT scan performed.  Dr. Julien Nordmann personally and independently reviewed the scan discussed the results with the patient today.  The scan showed _***  Radiation?   Labs were reviewed. His creatinine is stable at _ today. Recommend that he _ with cycle #16 today as scheduled with single agent immunotherapy with Keytruda.***  We will see him back for a follow up visit in 3 weeks for evaluation before starting cycle #17   He will continue to follow with neuro-oncology for  his history of metastatic disease to the brain    The patient was advised to call immediately if he has any concerning symptoms in the interval. The patient voices understanding of current disease status and treatment options and is in agreement with the current care plan. All questions were answered. The patient knows to call the clinic with any problems, questions or concerns. We can certainly see the patient much sooner if necessary       No orders of the defined types were placed in this encounter.    I spent {CHL ONC TIME VISIT - BBCWU:[id number]} counseling the patient face to face. The total time spent in the appointment was {CHL ONC TIME VISIT - UUEKC:[id number]}.  Jamillia Closson L Jatavius Ellenwood, PA-C 02/18/21

## 2021-02-22 ENCOUNTER — Inpatient Hospital Stay: Payer: No Typology Code available for payment source | Attending: Internal Medicine | Admitting: Internal Medicine

## 2021-02-22 ENCOUNTER — Encounter: Payer: Self-pay | Admitting: Internal Medicine

## 2021-02-22 ENCOUNTER — Other Ambulatory Visit: Payer: Self-pay

## 2021-02-22 VITALS — BP 162/91 | HR 68 | Temp 98.2°F | Resp 17 | Wt 183.7 lb

## 2021-02-22 DIAGNOSIS — Z79899 Other long term (current) drug therapy: Secondary | ICD-10-CM | POA: Diagnosis not present

## 2021-02-22 DIAGNOSIS — C7931 Secondary malignant neoplasm of brain: Secondary | ICD-10-CM | POA: Diagnosis not present

## 2021-02-22 DIAGNOSIS — Z5112 Encounter for antineoplastic immunotherapy: Secondary | ICD-10-CM | POA: Insufficient documentation

## 2021-02-22 DIAGNOSIS — F17210 Nicotine dependence, cigarettes, uncomplicated: Secondary | ICD-10-CM | POA: Insufficient documentation

## 2021-02-22 DIAGNOSIS — C3411 Malignant neoplasm of upper lobe, right bronchus or lung: Secondary | ICD-10-CM | POA: Diagnosis present

## 2021-02-22 NOTE — Progress Notes (Signed)
[facility] [address], [place] [address] [phone]   Interval Evaluation  Date of Service: 02/22/21 Patient Name: Randy Andersen Patient MRN: [id number] Patient DOB: [birth year] Provider: Ventura Sellers, MD  Identifying Statement:  Randy Andersen is a 65 y.o. male with Brain metastases [facility])   Primary Cancer:  Oncologic History: Oncology History  Malignant neoplasm of upper lobe of right lung ([place])  02/19/2020 Initial Diagnosis   Malignant neoplasm of upper lobe of right lung ([place])    04/07/2020 -  Chemotherapy    Patient is on Treatment Plan: LUNG CARBOPLATIN / PEMETREXED / PEMBROLIZUMAB Q21D INDUCTION X 4 CYCLES / MAINTENANCE PEMETREXED + PEMBROLIZUMAB        CNS Oncologic History: 03/18/20: SRS to two supratentorial metastases Randy Andersen)  Interval History: Randy Andersen presents today for follow up after completing recent MRI study.  He continues to describe cognitive issues, memory impairment, and language expression impairment.  None of this has progressed from prior.  Otherwise no new or progressive neurologic complaints.  Denies headaches, seizures.  H+P (12/21/20) Patient presents for follow up after recent MRI.  He complains of short term memory impairment, cognitive "slowing" since starting chemotherapy last year.  He also describes some difficulty getting words out or finding the correct words at times.  No issues with comprehension.  He is otherwise functional, independent.  He is able to cook, pay the bills, drives himself.  Currently on immuotherapy with Dr. Julien Nordmann.  Does acknowledge stroke ~10 years ago which affected his right sided sensation.   Medications: Current Outpatient Medications on File Prior to Visit  Medication Sig Dispense Refill   amLODipine (NORVASC) 10 MG tablet Take 10 mg by mouth daily.     Cholecalciferol 100 MCG (4000 UT) TABS Take 2 tablets by mouth daily.     doxylamine, Sleep, (UNISOM) 25 MG  tablet Take 25 mg by mouth at bedtime as needed for sleep.     folic acid (FOLVITE) 1 MG tablet Take 1 tablet (1 mg total) by mouth daily. 30 tablet 2   HYDROcodone-acetaminophen (NORCO) 10-325 MG tablet Take 1 tablet by mouth 2 (two) times daily.     oxybutynin (DITROPAN-XL) 10 MG 24 hr tablet Take by mouth.     phenazopyridine (PYRIDIUM) 200 MG tablet Take 1 tablet (200 mg total) by mouth 3 (three) times daily. 6 tablet 0   polyethylene glycol powder (GLYCOLAX/MIRALAX) 17 GM/SCOOP powder TAKE 17 GRAMS BY MOUTH DAILY (MIX WITH WATER OR OTHER LIQUID AS INSTRUCTED BEFORE DRINKING)     prochlorperazine (COMPAZINE) 10 MG tablet Take 1 tablet (10 mg total) by mouth every 6 (six) hours as needed. 30 tablet 2   rivaroxaban (XARELTO) 20 MG TABS tablet TAKE 1 TABLET BY MOUTH ONCE A DAY WITH SUPPER 30 tablet 2   senna-docusate (SENOKOT-S) 8.6-50 MG tablet Take 2 tablets by mouth daily.     tadalafil (CIALIS) 20 MG tablet Take by mouth.     tamsulosin (FLOMAX) 0.4 MG CAPS capsule Take 1 capsule (0.4 mg total) by mouth daily. 30 capsule 2   temazepam (RESTORIL) 15 MG capsule Take 1 capsule (15 mg total) by mouth at bedtime as needed for sleep. Pt is veteran - Call pt to set up delivery 30 capsule 0   No current facility-administered medications on file prior to visit.    Allergies: No Known Allergies Past Medical History:  Past Medical History:  Diagnosis Date   Asthma  as a child   Chronic pain disorder    LT leg and foot   Heart murmur    as a child   Hypertension    Malignant neoplasm of upper lobe of right lung ([place])    Stroke ([place])    mini stroke - 2015, some tingling in fingers on right    Past Surgical History:  Past Surgical History:  Procedure Laterality Date   BUNIONECTOMY Left    had hammer toe surgery also and callus removed   BUNIONECTOMY Right    also had hammer toe surgery and callus removed   LEG SURGERY Left    PT reports he has pins placed in his Lt leg   ORIF TIBIA  PLATEAU  10/09/2012   Dr Lorin Mercy   ORIF TIBIA PLATEAU Left 10/08/2012   Procedure: OPEN REDUCTION INTERNAL FIXATION (ORIF) TIBIAL PLATEAU;  Surgeon: Marybelle Killings, MD;  Location: [place] [facility];  Service: Orthopedics;  Laterality: Left;   VIDEO BRONCHOSCOPY WITH ENDOBRONCHIAL NAVIGATION N/A 02/25/2020   Procedure: VIDEO BRONCHOSCOPY WITH ENDOBRONCHIAL NAVIGATION with biopsies;  Surgeon: Collene Gobble, MD;  Location: [facility];  Service: Thoracic;  Laterality: N/A;   VIDEO BRONCHOSCOPY WITH ENDOBRONCHIAL ULTRASOUND N/A 02/25/2020   Procedure: VIDEO BRONCHOSCOPY WITH ENDOBRONCHIAL ULTRASOUND;  Surgeon: Collene Gobble, MD;  Location: [place];  Service: Thoracic;  Laterality: N/A;   Social History:  Social History   Socioeconomic History   Marital status: Single    Spouse name: Not on file   Number of children: Not on file   Years of education: Not on file   Highest education level: Not on file  Occupational History   Not on file  Tobacco Use   Smoking status: Every Day    Packs/day: 0.25    Years: 28.00    Pack years: 7.00    Types: Cigarettes   Smokeless tobacco: Never  Vaping Use   Vaping Use: Never used  Substance and Sexual Activity   Alcohol use: No   Drug use: No   Sexual activity: Not on file  Other Topics Concern   Not on file  Social History Narrative   Not on file   Social Determinants of Health   Financial Resource Strain: Medium Risk   Difficulty of Paying Living Expenses: Somewhat hard  Food Insecurity: No Food Insecurity   Worried About Running Out of Food in the Last Year: Never true   Ran Out of Food in the Last Year: Never true  Transportation Needs: No Transportation Needs   Lack of Transportation (Medical): No   Lack of Transportation (Non-Medical): No  Physical Activity: Insufficiently Active   Days of Exercise per Week: 3 days   Minutes of Exercise per Session: 20 min  Stress: Not on file  Social Connections: Socially Isolated   Frequency of Communication with  Friends and Family: Once a week   Frequency of Social Gatherings with Friends and Family: Once a week   Attends Religious Services: Never   Marine scientist or Organizations: No   Attends Music therapist: Never   Marital Status: Never married  Human resources officer Violence: Not on file   Family History:  Family History  Problem Relation Age of Onset   Cancer Mother        Intestinal metastatic to ovaries    Review of Systems: Constitutional: Doesn't report fevers, chills or abnormal weight loss Eyes: Doesn't report blurriness of vision Ears, nose, mouth, throat, and face: Doesn't report sore  throat Respiratory: Doesn't report cough, dyspnea or wheezes Cardiovascular: Doesn't report palpitation, chest discomfort  Gastrointestinal:  Doesn't report nausea, constipation, diarrhea GU: Doesn't report incontinence Skin: Doesn't report skin rashes Neurological: Per HPI Musculoskeletal: Doesn't report joint pain Behavioral/Psych: Doesn't report anxiety  Physical Exam: Vitals:   02/22/21 1054  BP: (!) 162/91  Pulse: 68  Resp: 17  Temp: 98.2 F (36.8 C)  SpO2: 100%   KPS: 80. General: Alert, cooperative, pleasant, in no acute distress Head: Normal EENT: No conjunctival injection or scleral icterus.  Lungs: Resp effort normal Cardiac: Regular rate Abdomen: Non-distended abdomen Skin: No rashes cyanosis or petechiae. Extremities: No clubbing or edema  Neurologic Exam: Mental Status: Awake, alert, attentive to examiner. Oriented to self and environment. Language is fluent with intact comprehension.  Some psychomotor slowing. Cranial Nerves: Visual acuity is grossly normal. Visual fields are full. Extra-ocular movements intact. No ptosis. Face is symmetric Motor: Tone and bulk are normal. Power is full in both arms and legs. Reflexes are symmetric, no pathologic reflexes present.  Sensory: Intact to light touch Gait: Normal.   Labs: I have reviewed the data  as listed    Component Value Date/Time   NA 141 02/02/2021 1007   K 3.9 02/02/2021 1007   CL 108 02/02/2021 1007   CO2 23 02/02/2021 1007   GLUCOSE 105 (H) 02/02/2021 1007   BUN 16 02/02/2021 1007   CREATININE 1.76 (H) 02/02/2021 1007   CALCIUM 9.7 02/02/2021 1007   PROT 7.9 02/02/2021 1007   ALBUMIN 3.5 02/02/2021 1007   AST 14 (L) 02/02/2021 1007   ALT 10 02/02/2021 1007   ALKPHOS 108 02/02/2021 1007   BILITOT 0.4 02/02/2021 1007   GFRNONAA 42 (L) 02/02/2021 1007   GFRAA 52 (L) 04/21/2020 1403   Lab Results  Component Value Date   WBC 5.5 02/02/2021   NEUTROABS 2.9 02/02/2021   HGB 13.9 02/02/2021   HCT 41.3 02/02/2021   MCV 89.6 02/02/2021   PLT 171 02/02/2021    Imaging:  CT Chest W Contrast  Result Date: 02/08/2021 CLINICAL DATA:  Primary Cancer Type: Lung Imaging Indication: Assess response to therapy Interval therapy since last imaging? Yes Initial Cancer Diagnosis Date: 02/25/2020; Established by: Presumed Detailed Pathology: Stage IV non-small cell lung cancer, favored to be adenocarcinoma. 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 Surgeries: No. Chemotherapy: Yes; Ongoing? No; Most recent administration: 11/10/2020 Immunotherapy?  Yes; Type: Keytruda; Ongoing? Yes Radiation therapy?  Yes; Date Range: 03/18/2020; Target: Brain EXAM: CT CHEST, ABDOMEN, AND PELVIS WITH CONTRAST TECHNIQUE: Multidetector CT imaging of the chest, abdomen and pelvis was performed following the standard protocol during bolus administration of intravenous contrast. CONTRAST:  23mL OMNIPAQUE IOHEXOL 350 MG/ML SOLN COMPARISON:  Most recent CT chest, abdomen and pelvis 11/09/2020. FINDINGS: CT CHEST FINDINGS Cardiovascular: The heart is normal in size. No pericardial effusion. The aorta is normal in caliber. Stable mild atherosclerotic calcifications. The branch vessels are patent. Stable scattered coronary artery calcifications. Mediastinum/Nodes: No mediastinal hilar mass  lymphadenopathy. Right-sided tracheal debris is noted. The esophagus is grossly normal. Lungs/Pleura: Stable underlying emphysematous changes. No acute pulmonary findings. The posterior right upper lobe lung lesion is significantly larger. It measures approximately 4.7 x 3.2 cm on image 64/4 and previously measured 2.7 x 2.1 cm. Findings worrisome for recurrent/progressive tumor. PET-CT may be helpful for further evaluation. Stable small sub solid nodule in the left upper lobe on image number 56/4. This measures 5 mm. A few tiny  the sub 3 mm scattered pulmonary nodules are stable. No new or progressive findings. No acute pulmonary process. No pleural effusions or pleural nodules. Musculoskeletal: No chest wall mass, supraclavicular or axillary adenopathy. Stable 14 mm left thyroid nodule which has been previously addressed and biopsy. This has been evaluated on previous imaging. (ref: J Am Coll Radiol. 2015 Feb;12(2): 143-50). CT ABDOMEN PELVIS FINDINGS Hepatobiliary: No worrisome hepatic lesions to suggest metastatic disease. The gallbladder is unremarkable. No common bile duct dilatation. Pancreas: No mass, inflammation or ductal dilatation. Spleen: Normal size.  No focal lesions. Adrenals/Urinary Tract: The adrenal glands normal. No renal lesions or hydronephrosis.  The bladder is unremarkable. Stomach/Bowel: Stomach, duodenum, small bowel and colon are unremarkable. No acute inflammatory changes, mass lesions or obstructive findings. Vascular/Lymphatic: Stable scattered aortic and iliac artery calcifications but no aneurysm or dissection. No mesenteric or retroperitoneal mass or adenopathy. Reproductive: The prostate gland and seminal vesicles are unremarkable stable. Other: No pelvic mass or adenopathy. No free pelvic fluid collections. No inguinal mass or adenopathy. No abdominal wall hernia or subcutaneous lesions. Musculoskeletal: No significant bony findings. No lytic or sclerotic bone lesions are  identified. The SI joints are largely fused. IMPRESSION: 1. Interval enlargement of the posterior right upper lobe lung lesion worrisome for recurrent/progressive tumor. Recommend PET-CT for further evaluation. 2. No mediastinal or hilar mass or adenopathy. 3. Stable emphysematous changes and pulmonary scarring. 4. Stable 5 mm sub solid left upper lobe nodule. 5. No findings for abdominal/pelvic metastatic disease. 6. Emphysema and aortic atherosclerosis. Aortic Atherosclerosis (ICD10-I70.0) and Emphysema (ICD10-J43.9). Electronically Signed   By: Marijo Sanes M.D.   On: 02/08/2021 11:19   MR BRAIN W WO CONTRAST  Result Date: 02/19/2021 CLINICAL DATA:  Lung cancer follow-up EXAM: MRI HEAD WITHOUT AND WITH CONTRAST TECHNIQUE: Multiplanar, multiecho pulse sequences of the brain and surrounding structures were obtained without and with intravenous contrast. CONTRAST:  20mL MULTIHANCE GADOBENATE DIMEGLUMINE 529 MG/ML IV SOLN COMPARISON:  11/25/2020 FINDINGS: BRAIN New Lesions: None. Larger lesions: None. Stable or Smaller lesions: High left frontal dot of enhancement only seen on sagittal postcontrast imaging is a site of previously treated disease that is stable. The questioned right perirolandic metastasis on prior is not re-identified. Other Brain findings: Remote bilateral subcortical perforator infarcts at the basal ganglia/deep white matter tracks. Mild chronic small vessel ischemic change in the hemispheric white matter. Brain volume is normal. No hydrocephalus or collection. Vascular: Normal flow voids and vascular enhancements Skull and upper cervical spine: Normal marrow signal. C2-3 non segmentation. Sinuses/Orbits: Negative IMPRESSION: Treated metastatic disease without new or increased lesion. Electronically Signed   By: Monte Fantasia M.D.   On: 02/19/2021 15:02   CT Abdomen Pelvis W Contrast  Result Date: 02/08/2021 CLINICAL DATA:  Primary Cancer Type: Lung Imaging Indication: Assess response to  therapy Interval therapy since last imaging? Yes Initial Cancer Diagnosis Date: 02/25/2020; Established by: Presumed Detailed Pathology: Stage IV non-small cell lung cancer, favored to be adenocarcinoma. 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 Surgeries: No. Chemotherapy: Yes; Ongoing? No; Most recent administration: 11/10/2020 Immunotherapy?  Yes; Type: Keytruda; Ongoing? Yes Radiation therapy?  Yes; Date Range: 03/18/2020; Target: Brain EXAM: CT CHEST, ABDOMEN, AND PELVIS WITH CONTRAST TECHNIQUE: Multidetector CT imaging of the chest, abdomen and pelvis was performed following the standard protocol during bolus administration of intravenous contrast. CONTRAST:  35mL OMNIPAQUE IOHEXOL 350 MG/ML SOLN COMPARISON:  Most recent CT chest, abdomen and pelvis 11/09/2020. FINDINGS: CT CHEST FINDINGS Cardiovascular:  The heart is normal in size. No pericardial effusion. The aorta is normal in caliber. Stable mild atherosclerotic calcifications. The branch vessels are patent. Stable scattered coronary artery calcifications. Mediastinum/Nodes: No mediastinal hilar mass lymphadenopathy. Right-sided tracheal debris is noted. The esophagus is grossly normal. Lungs/Pleura: Stable underlying emphysematous changes. No acute pulmonary findings. The posterior right upper lobe lung lesion is significantly larger. It measures approximately 4.7 x 3.2 cm on image 64/4 and previously measured 2.7 x 2.1 cm. Findings worrisome for recurrent/progressive tumor. PET-CT may be helpful for further evaluation. Stable small sub solid nodule in the left upper lobe on image number 56/4. This measures 5 mm. A few tiny the sub 3 mm scattered pulmonary nodules are stable. No new or progressive findings. No acute pulmonary process. No pleural effusions or pleural nodules. Musculoskeletal: No chest wall mass, supraclavicular or axillary adenopathy. Stable 14 mm left thyroid nodule which has been previously addressed and biopsy.  This has been evaluated on previous imaging. (ref: J Am Coll Radiol. 2015 Feb;12(2): 143-50). CT ABDOMEN PELVIS FINDINGS Hepatobiliary: No worrisome hepatic lesions to suggest metastatic disease. The gallbladder is unremarkable. No common bile duct dilatation. Pancreas: No mass, inflammation or ductal dilatation. Spleen: Normal size.  No focal lesions. Adrenals/Urinary Tract: The adrenal glands normal. No renal lesions or hydronephrosis.  The bladder is unremarkable. Stomach/Bowel: Stomach, duodenum, small bowel and colon are unremarkable. No acute inflammatory changes, mass lesions or obstructive findings. Vascular/Lymphatic: Stable scattered aortic and iliac artery calcifications but no aneurysm or dissection. No mesenteric or retroperitoneal mass or adenopathy. Reproductive: The prostate gland and seminal vesicles are unremarkable stable. Other: No pelvic mass or adenopathy. No free pelvic fluid collections. No inguinal mass or adenopathy. No abdominal wall hernia or subcutaneous lesions. Musculoskeletal: No significant bony findings. No lytic or sclerotic bone lesions are identified. The SI joints are largely fused. IMPRESSION: 1. Interval enlargement of the posterior right upper lobe lung lesion worrisome for recurrent/progressive tumor. Recommend PET-CT for further evaluation. 2. No mediastinal or hilar mass or adenopathy. 3. Stable emphysematous changes and pulmonary scarring. 4. Stable 5 mm sub solid left upper lobe nodule. 5. No findings for abdominal/pelvic metastatic disease. 6. Emphysema and aortic atherosclerosis. Aortic Atherosclerosis (ICD10-I70.0) and Emphysema (ICD10-J43.9). Electronically Signed   By: Marijo Sanes M.D.   On: 02/08/2021 11:19     [facility] Clinician Interpretation: I have personally reviewed the radiological images as listed.  My interpretation, in the context of the patient's clinical presentation, is stable disease   Assessment/Plan Brain metastases [facility]) [C79.31]  [facility] is clinically and radiographically stable today.  Previously visualized R frontal enhancing focus has resolved without intervention.  Other treated lesions are stable.  We provided brain tumor and cognitive specific survivorship counseling today.    We appreciate the opportunity to participate in the care of Randy Andersen.    We ask that Randy Andersen return to clinic in 4 months following next brain MRI, or sooner as needed.  All questions were answered. The patient knows to call the clinic with any problems, questions or concerns. No barriers to learning were detected.  The total time spent in the encounter was 30 minutes and more than 50% was on counseling and review of test results   Ventura Sellers, MD Medical Director of Neuro-Oncology [facility] at [place] 02/22/21 11:00 AM

## 2021-02-23 ENCOUNTER — Inpatient Hospital Stay: Payer: No Typology Code available for payment source

## 2021-02-23 ENCOUNTER — Other Ambulatory Visit: Payer: Self-pay | Admitting: Radiation Therapy

## 2021-02-23 ENCOUNTER — Inpatient Hospital Stay (HOSPITAL_BASED_OUTPATIENT_CLINIC_OR_DEPARTMENT_OTHER): Payer: No Typology Code available for payment source | Admitting: Physician Assistant

## 2021-02-23 ENCOUNTER — Ambulatory Visit: Payer: No Typology Code available for payment source | Admitting: Physician Assistant

## 2021-02-23 ENCOUNTER — Inpatient Hospital Stay: Payer: No Typology Code available for payment source | Admitting: Dietician

## 2021-02-23 VITALS — BP 153/90 | HR 67 | Temp 97.1°F | Resp 18 | Ht 74.0 in | Wt 183.1 lb

## 2021-02-23 DIAGNOSIS — Z5112 Encounter for antineoplastic immunotherapy: Secondary | ICD-10-CM

## 2021-02-23 DIAGNOSIS — C3411 Malignant neoplasm of upper lobe, right bronchus or lung: Secondary | ICD-10-CM

## 2021-02-23 LAB — CBC WITH DIFFERENTIAL (CANCER CENTER ONLY)
Abs Immature Granulocytes: 0.01 10*3/uL (ref 0.00–0.07)
Basophils Absolute: 0 10*3/uL (ref 0.0–0.1)
Basophils Relative: 1 %
Eosinophils Absolute: 0.1 10*3/uL (ref 0.0–0.5)
Eosinophils Relative: 3 %
HCT: 40.2 % (ref 39.0–52.0)
Hemoglobin: 13.6 g/dL (ref 13.0–17.0)
Immature Granulocytes: 0 %
Lymphocytes Relative: 31 %
Lymphs Abs: 1.4 10*3/uL (ref 0.7–4.0)
MCH: 29.3 pg (ref 26.0–34.0)
MCHC: 33.8 g/dL (ref 30.0–36.0)
MCV: 86.6 fL (ref 80.0–100.0)
Monocytes Absolute: 0.6 10*3/uL (ref 0.1–1.0)
Monocytes Relative: 12 %
Neutro Abs: 2.4 10*3/uL (ref 1.7–7.7)
Neutrophils Relative %: 53 %
Platelet Count: 180 10*3/uL (ref 150–400)
RBC: 4.64 MIL/uL (ref 4.22–5.81)
RDW: 12.7 % (ref 11.5–15.5)
WBC Count: 4.5 10*3/uL (ref 4.0–10.5)
nRBC: 0 % (ref 0.0–0.2)

## 2021-02-23 LAB — CMP (CANCER CENTER ONLY)
ALT: 14 U/L (ref 0–44)
AST: 14 U/L — ABNORMAL LOW (ref 15–41)
Albumin: 3.4 g/dL — ABNORMAL LOW (ref 3.5–5.0)
Alkaline Phosphatase: 107 U/L (ref 38–126)
Anion gap: 8 (ref 5–15)
BUN: 18 mg/dL (ref 8–23)
CO2: 23 mmol/L (ref 22–32)
Calcium: 9.8 mg/dL (ref 8.9–10.3)
Chloride: 110 mmol/L (ref 98–111)
Creatinine: 1.73 mg/dL — ABNORMAL HIGH (ref 0.61–1.24)
GFR, Estimated: 43 mL/min — ABNORMAL LOW (ref >60)
Glucose, Bld: 96 mg/dL (ref 70–99)
Potassium: 4.1 mmol/L (ref 3.5–5.1)
Sodium: 141 mmol/L (ref 135–145)
Total Bilirubin: 0.2 mg/dL — ABNORMAL LOW (ref 0.3–1.2)
Total Protein: 7.6 g/dL (ref 6.5–8.1)

## 2021-02-23 MED ORDER — SODIUM CHLORIDE 0.9 % IV SOLN
200.0000 mg | Freq: Once | INTRAVENOUS | Status: AC
  Administered 2021-02-23: 200 mg via INTRAVENOUS
  Filled 2021-02-23: qty 8

## 2021-02-23 MED ORDER — SODIUM CHLORIDE 0.9 % IV SOLN
Freq: Once | INTRAVENOUS | Status: AC
  Filled 2021-02-23: qty 250

## 2021-02-23 NOTE — Progress Notes (Signed)
Creatinine 1.76, okay to proceed per provider.

## 2021-02-23 NOTE — Progress Notes (Signed)
[place] [address]  DIAGNOSIS: Stage IV non-small cell lung cancer, favored to be adenocarcinoma.  He presented with posterior right upper lobe nodule as well as right hilar, infrahilar, subcarinal, and right paratracheal lymphadenopathy.  He also has a 1.4 cm x 1 cm soft tissue nodule in the skin/subcutaneous fat in the left axilla.  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  He was diagnosed in August 2021   Molecular Biomarkers: BIOMARKER(S)         % CFDNA OR AMPLIFICATION       ASSOCIATED FDA-APPROVED THERAPIES        CLINICAL TRIAL AVAILABILITY [id number] 2.9% None    Yes [id number] 0.4% None    Yes [id number] 0.2% None    Yes  PRIOR THERAPY: SRS to the small metastatic brain lesion under the care of Dr. [name] on 03/18/20  CURRENT THERAPY: Palliative systemic chemotherapy with carboplatin for an AUC of 5, Alimta 500 mg/m2, and Keytruda 200 mg IV every 3 weeks. First dose expected on 04/07/20. Status post 15 cycles. Starting from cycle #5 he will be on maintenance treatment with Alimta and Keytruda every 3 weeks. His dose of Alimta was reduced to 400 mg/m2. Alimta removed starting from cycle #11 due to renal insuffiencey.   INTERVAL HISTORY: Randy Andersen 65 y.o. male returns to the clinic today for a follow up visit. The patient is feeling well today without any concerning complaints. He is tolerating his treatment well without any adverse side effects. Alimta was removed from his treatment plan due to renal insufficiency. He is currently undergoing single agent immunotherapy with Keytruda. He recently had a telephone visit with nutrition due to weight loss and decreased appetite. He did gain a few pounds since his appointment. He has been drinking 2 ensures per day. Nutrition is scheduled to follow up with him while in the infusion room today. He denies  fevers, chills, recent night sweats, cough or shortness of breath. He denies nausea, vomiting, diarrhea, or constipation. Denies chest pain or hemoptysis. He denies rashes or skin changes. He denies headaches or visual changes. He is followed by neuro-oncology for his history of metastatic disease to the brain. He recently had a repeat brain MRI and a follow up with Dr. Mickeal Skinner.  He recently had a restaging CT scan.  He is here for evaluation and to review his scan results before starting cycle #16.    MEDICAL HISTORY: Past Medical History:  Diagnosis Date   Asthma    as a child   Chronic pain disorder    LT leg and foot   Heart murmur    as a child   Hypertension    Malignant neoplasm of upper lobe of right lung ([facility])    Stroke ([facility])    mini stroke - 2015, some tingling in fingers on right     ALLERGIES:  has No Known Allergies.  MEDICATIONS:  Current Outpatient Medications  Medication Sig Dispense Refill   amLODipine (NORVASC) 10 MG tablet Take 10 mg by mouth daily.     Cholecalciferol 100 MCG (4000 UT) TABS Take 2 tablets by mouth daily.     doxylamine, Sleep, (UNISOM) 25 MG tablet Take 25 mg by mouth at bedtime as needed for sleep.     folic acid (FOLVITE) 1 MG tablet Take 1 tablet (1 mg total) by mouth daily. 30 tablet 2  HYDROcodone-acetaminophen (NORCO) 10-325 MG tablet Take 1 tablet by mouth 2 (two) times daily.     oxybutynin (DITROPAN-XL) 10 MG 24 hr tablet Take by mouth.     phenazopyridine (PYRIDIUM) 200 MG tablet Take 1 tablet (200 mg total) by mouth 3 (three) times daily. 6 tablet 0   polyethylene glycol powder (GLYCOLAX/MIRALAX) 17 GM/SCOOP powder TAKE 17 GRAMS BY MOUTH DAILY (MIX WITH WATER OR OTHER LIQUID AS INSTRUCTED BEFORE DRINKING)     prochlorperazine (COMPAZINE) 10 MG tablet Take 1 tablet (10 mg total) by mouth every 6 (six) hours as needed. 30 tablet 2   rivaroxaban (XARELTO) 20 MG TABS tablet TAKE 1 TABLET BY MOUTH ONCE A DAY WITH SUPPER 30 tablet 2    senna-docusate (SENOKOT-S) 8.6-50 MG tablet Take 2 tablets by mouth daily.     tadalafil (CIALIS) 20 MG tablet Take by mouth.     tamsulosin (FLOMAX) 0.4 MG CAPS capsule Take 1 capsule (0.4 mg total) by mouth daily. 30 capsule 2   temazepam (RESTORIL) 15 MG capsule Take 1 capsule (15 mg total) by mouth at bedtime as needed for sleep. Pt is veteran - Call pt to set up delivery 30 capsule 0   No current facility-administered medications for this visit.    SURGICAL HISTORY:  Past Surgical History:  Procedure Laterality Date   BUNIONECTOMY Left    had hammer toe surgery also and callus removed   BUNIONECTOMY Right    also had hammer toe surgery and callus removed   LEG SURGERY Left    PT reports he has pins placed in his Lt leg   ORIF TIBIA PLATEAU  10/09/2012   Dr Lorin Mercy   ORIF TIBIA PLATEAU Left 10/08/2012   Procedure: OPEN REDUCTION INTERNAL FIXATION (ORIF) TIBIAL PLATEAU;  Surgeon: Marybelle Killings, MD;  Location: [facility];  Service: Orthopedics;  Laterality: Left;   VIDEO BRONCHOSCOPY WITH ENDOBRONCHIAL NAVIGATION N/A 02/25/2020   Procedure: VIDEO BRONCHOSCOPY WITH ENDOBRONCHIAL NAVIGATION with biopsies;  Surgeon: Collene Gobble, MD;  Location: [facility];  Service: Thoracic;  Laterality: N/A;   VIDEO BRONCHOSCOPY WITH ENDOBRONCHIAL ULTRASOUND N/A 02/25/2020   Procedure: VIDEO BRONCHOSCOPY WITH ENDOBRONCHIAL ULTRASOUND;  Surgeon: Collene Gobble, MD;  Location: [facility];  Service: Thoracic;  Laterality: N/A;    REVIEW OF SYSTEMS:   Review of Systems  Constitutional: Negative for appetite change, chills, fatigue, fever and unexpected weight change.  HENT:   Negative for mouth sores, nosebleeds, sore throat and trouble swallowing.   Eyes: Negative for eye problems and icterus.  Respiratory: Negative for cough, hemoptysis, shortness of breath and wheezing.   Cardiovascular: Negative for chest pain and leg swelling.  Gastrointestinal: Negative for abdominal pain, constipation, diarrhea, nausea and  vomiting.  Genitourinary: Negative for bladder incontinence, difficulty urinating, dysuria, frequency and hematuria.   Musculoskeletal: Negative for back pain, gait problem, neck pain and neck stiffness.  Skin: Negative for itching and rash.  Neurological: Negative for dizziness, extremity weakness, gait problem, headaches, light-headedness and seizures.  Hematological: Negative for adenopathy. Does not bruise/bleed easily.  Psychiatric/Behavioral: Negative for confusion, depression and sleep disturbance. The patient is not nervous/anxious.     PHYSICAL EXAMINATION:  Blood pressure (!) 153/90, pulse 67, temperature (!) 97.1 F (36.2 C), temperature source Tympanic, resp. rate 18, height 6\' 2"  (1.88 m), weight 183 lb 1.6 oz (83.1 kg), SpO2 100 %.  ECOG PERFORMANCE STATUS: 1  Physical Exam  Constitutional: Oriented to person, place, and time and well-developed, well-nourished, and in no distress.  No distress.  HENT:  Head: Normocephalic and atraumatic.  Mouth/Throat: Oropharynx is clear and moist. No oropharyngeal exudate.  Eyes: Conjunctivae are normal. Right eye exhibits no discharge. Left eye exhibits no discharge. No scleral icterus.  Neck: Normal range of motion. Neck supple.  Cardiovascular: Normal rate, regular rhythm, normal heart sounds and intact distal pulses.   Pulmonary/Chest: Effort normal and breath sounds normal. No respiratory distress. No wheezes. No rales.  Abdominal: Soft. Bowel sounds are normal. Exhibits no distension and no mass. There is no tenderness.  Musculoskeletal: Normal range of motion. Exhibits no edema.  Lymphadenopathy:    No cervical adenopathy.  Neurological: Alert and oriented to person, place, and time. Exhibits normal muscle tone. Gait normal. Coordination normal.  Skin: Skin is warm and dry. No rash noted. Not diaphoretic. No erythema. No pallor.  Psychiatric: Mood, memory and judgment normal.  Vitals reviewed.  LABORATORY DATA: Lab Results   Component Value Date   WBC 4.5 02/23/2021   HGB 13.6 02/23/2021   HCT 40.2 02/23/2021   MCV 86.6 02/23/2021   PLT 180 02/23/2021      Chemistry      Component Value Date/Time   NA 141 02/02/2021 1007   K 3.9 02/02/2021 1007   CL 108 02/02/2021 1007   CO2 23 02/02/2021 1007   BUN 16 02/02/2021 1007   CREATININE 1.76 (H) 02/02/2021 1007      Component Value Date/Time   CALCIUM 9.7 02/02/2021 1007   ALKPHOS 108 02/02/2021 1007   AST 14 (L) 02/02/2021 1007   ALT 10 02/02/2021 1007   BILITOT 0.4 02/02/2021 1007       RADIOGRAPHIC STUDIES:  CT Chest W Contrast  Result Date: 02/08/2021 CLINICAL DATA:  Primary Cancer Type: Lung Imaging Indication: Assess response to therapy Interval therapy since last imaging? Yes Initial Cancer Diagnosis Date: 02/25/2020; Established by: Presumed Detailed Pathology: Stage IV non-small cell lung cancer, favored to be adenocarcinoma. 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 Surgeries: No. Chemotherapy: Yes; Ongoing? No; Most recent administration: 11/10/2020 Immunotherapy?  Yes; Type: Keytruda; Ongoing? Yes Radiation therapy?  Yes; Date Range: 03/18/2020; Target: Brain EXAM: CT CHEST, ABDOMEN, AND PELVIS WITH CONTRAST TECHNIQUE: Multidetector CT imaging of the chest, abdomen and pelvis was performed following the standard protocol during bolus administration of intravenous contrast. CONTRAST:  80mL OMNIPAQUE IOHEXOL 350 MG/ML SOLN COMPARISON:  Most recent CT chest, abdomen and pelvis 11/09/2020. FINDINGS: CT CHEST FINDINGS Cardiovascular: The heart is normal in size. No pericardial effusion. The aorta is normal in caliber. Stable mild atherosclerotic calcifications. The branch vessels are patent. Stable scattered coronary artery calcifications. Mediastinum/Nodes: No mediastinal hilar mass lymphadenopathy. Right-sided tracheal debris is noted. The esophagus is grossly normal. Lungs/Pleura: Stable underlying emphysematous changes. No  acute pulmonary findings. The posterior right upper lobe lung lesion is significantly larger. It measures approximately 4.7 x 3.2 cm on image 64/4 and previously measured 2.7 x 2.1 cm. Findings worrisome for recurrent/progressive tumor. PET-CT may be helpful for further evaluation. Stable small sub solid nodule in the left upper lobe on image number 56/4. This measures 5 mm. A few tiny the sub 3 mm scattered pulmonary nodules are stable. No new or progressive findings. No acute pulmonary process. No pleural effusions or pleural nodules. Musculoskeletal: No chest wall mass, supraclavicular or axillary adenopathy. Stable 14 mm left thyroid nodule which has been previously addressed and biopsy. This has been evaluated on previous imaging. (ref: J Am Coll Radiol. 2015 Feb;12(2): 143-50).  CT ABDOMEN PELVIS FINDINGS Hepatobiliary: No worrisome hepatic lesions to suggest metastatic disease. The gallbladder is unremarkable. No common bile duct dilatation. Pancreas: No mass, inflammation or ductal dilatation. Spleen: Normal size.  No focal lesions. Adrenals/Urinary Tract: The adrenal glands normal. No renal lesions or hydronephrosis.  The bladder is unremarkable. Stomach/Bowel: Stomach, duodenum, small bowel and colon are unremarkable. No acute inflammatory changes, mass lesions or obstructive findings. Vascular/Lymphatic: Stable scattered aortic and iliac artery calcifications but no aneurysm or dissection. No mesenteric or retroperitoneal mass or adenopathy. Reproductive: The prostate gland and seminal vesicles are unremarkable stable. Other: No pelvic mass or adenopathy. No free pelvic fluid collections. No inguinal mass or adenopathy. No abdominal wall hernia or subcutaneous lesions. Musculoskeletal: No significant bony findings. No lytic or sclerotic bone lesions are identified. The SI joints are largely fused. IMPRESSION: 1. Interval enlargement of the posterior right upper lobe lung lesion worrisome for  recurrent/progressive tumor. Recommend PET-CT for further evaluation. 2. No mediastinal or hilar mass or adenopathy. 3. Stable emphysematous changes and pulmonary scarring. 4. Stable 5 mm sub solid left upper lobe nodule. 5. No findings for abdominal/pelvic metastatic disease. 6. Emphysema and aortic atherosclerosis. Aortic Atherosclerosis (ICD10-I70.0) and Emphysema (ICD10-J43.9). Electronically Signed   By: Marijo Sanes M.D.   On: 02/08/2021 11:19   MR BRAIN W WO CONTRAST  Result Date: 02/19/2021 CLINICAL DATA:  Lung cancer follow-up EXAM: MRI HEAD WITHOUT AND WITH CONTRAST TECHNIQUE: Multiplanar, multiecho pulse sequences of the brain and surrounding structures were obtained without and with intravenous contrast. CONTRAST:  74mL MULTIHANCE GADOBENATE DIMEGLUMINE 529 MG/ML IV SOLN COMPARISON:  11/25/2020 FINDINGS: BRAIN New Lesions: None. Larger lesions: None. Stable or Smaller lesions: High left frontal dot of enhancement only seen on sagittal postcontrast imaging is a site of previously treated disease that is stable. The questioned right perirolandic metastasis on prior is not re-identified. Other Brain findings: Remote bilateral subcortical perforator infarcts at the basal ganglia/deep white matter tracks. Mild chronic small vessel ischemic change in the hemispheric white matter. Brain volume is normal. No hydrocephalus or collection. Vascular: Normal flow voids and vascular enhancements Skull and upper cervical spine: Normal marrow signal. C2-3 non segmentation. Sinuses/Orbits: Negative IMPRESSION: Treated metastatic disease without new or increased lesion. Electronically Signed   By: Monte Fantasia M.D.   On: 02/19/2021 15:02   CT Abdomen Pelvis W Contrast  Result Date: 02/08/2021 CLINICAL DATA:  Primary Cancer Type: Lung Imaging Indication: Assess response to therapy Interval therapy since last imaging? Yes Initial Cancer Diagnosis Date: 02/25/2020; Established by: Presumed Detailed Pathology:  Stage IV non-small cell lung cancer, favored to be adenocarcinoma. 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 Surgeries: No. Chemotherapy: Yes; Ongoing? No; Most recent administration: 11/10/2020 Immunotherapy?  Yes; Type: Keytruda; Ongoing? Yes Radiation therapy?  Yes; Date Range: 03/18/2020; Target: Brain EXAM: CT CHEST, ABDOMEN, AND PELVIS WITH CONTRAST TECHNIQUE: Multidetector CT imaging of the chest, abdomen and pelvis was performed following the standard protocol during bolus administration of intravenous contrast. CONTRAST:  63mL OMNIPAQUE IOHEXOL 350 MG/ML SOLN COMPARISON:  Most recent CT chest, abdomen and pelvis 11/09/2020. FINDINGS: CT CHEST FINDINGS Cardiovascular: The heart is normal in size. No pericardial effusion. The aorta is normal in caliber. Stable mild atherosclerotic calcifications. The branch vessels are patent. Stable scattered coronary artery calcifications. Mediastinum/Nodes: No mediastinal hilar mass lymphadenopathy. Right-sided tracheal debris is noted. The esophagus is grossly normal. Lungs/Pleura: Stable underlying emphysematous changes. No acute pulmonary findings. The posterior right upper lobe lung lesion  is significantly larger. It measures approximately 4.7 x 3.2 cm on image 64/4 and previously measured 2.7 x 2.1 cm. Findings worrisome for recurrent/progressive tumor. PET-CT may be helpful for further evaluation. Stable small sub solid nodule in the left upper lobe on image number 56/4. This measures 5 mm. A few tiny the sub 3 mm scattered pulmonary nodules are stable. No new or progressive findings. No acute pulmonary process. No pleural effusions or pleural nodules. Musculoskeletal: No chest wall mass, supraclavicular or axillary adenopathy. Stable 14 mm left thyroid nodule which has been previously addressed and biopsy. This has been evaluated on previous imaging. (ref: J Am Coll Radiol. 2015 Feb;12(2): 143-50). CT ABDOMEN PELVIS FINDINGS Hepatobiliary: No  worrisome hepatic lesions to suggest metastatic disease. The gallbladder is unremarkable. No common bile duct dilatation. Pancreas: No mass, inflammation or ductal dilatation. Spleen: Normal size.  No focal lesions. Adrenals/Urinary Tract: The adrenal glands normal. No renal lesions or hydronephrosis.  The bladder is unremarkable. Stomach/Bowel: Stomach, duodenum, small bowel and colon are unremarkable. No acute inflammatory changes, mass lesions or obstructive findings. Vascular/Lymphatic: Stable scattered aortic and iliac artery calcifications but no aneurysm or dissection. No mesenteric or retroperitoneal mass or adenopathy. Reproductive: The prostate gland and seminal vesicles are unremarkable stable. Other: No pelvic mass or adenopathy. No free pelvic fluid collections. No inguinal mass or adenopathy. No abdominal wall hernia or subcutaneous lesions. Musculoskeletal: No significant bony findings. No lytic or sclerotic bone lesions are identified. The SI joints are largely fused. IMPRESSION: 1. Interval enlargement of the posterior right upper lobe lung lesion worrisome for recurrent/progressive tumor. Recommend PET-CT for further evaluation. 2. No mediastinal or hilar mass or adenopathy. 3. Stable emphysematous changes and pulmonary scarring. 4. Stable 5 mm sub solid left upper lobe nodule. 5. No findings for abdominal/pelvic metastatic disease. 6. Emphysema and aortic atherosclerosis. Aortic Atherosclerosis (ICD10-I70.0) and Emphysema (ICD10-J43.9). Electronically Signed   By: Marijo Sanes M.D.   On: 02/08/2021 11:19     ASSESSMENT/PLAN:  This is a very pleasant 65 year old African-American male with stage IV non-small cell lung cancer, favored to be adenocarcinoma.  He presented with posterior right upper lobe nodule as well as right hilar, infrahilar, subcarinal, and right paratracheal lymphadenopathy.  He also has a 1.4 cm x 1 cm soft tissue nodule in the skin/subcutaneous fat in the left axilla.  He  also has 2 small metastatic lesions measuring 4 mm in the brain.  He was diagnosed in August 2021.  Molecular studies by [facility] are negative for any actionable mutations.   The patient completed SRS to the small metastatic brain lesion under the care of Dr. [name] on 03/18/20   The patient is currently undergoing palliative systemic chemotherapy with carboplatin for an AUC of 5, Alimta 500 mg per metered squared, and Keytruda 200 mg IV every 3 weeks.  He is status post 15 cycles. The dose of alimta was reduced to 400 mg/m2 due to renal insufficiency. Alimta was removed from the treatment plan with cycle #11 due to renal insufficiency.  The patient recently had a restaging CT scan performed.  Dr. Julien Nordmann personally and independently reviewed the scan discussed the results with the patient today.  The scan showed enlargement of the right upper lobe lung lesion. No other sites of disease progression.   Dr. Julien Nordmann recommends referring the patient to radiation oncology for consideration of radiation to this enlarging right upper lobe nodule. I have placed the referral.   His CMP is still pending. As long  as his labs are adequate, he will proceed with single agent immunotherapy with Keytruda today.   We will see him back for a follow up visit in 3 weeks for evaluation before starting cycle #17   He will continue to follow with neuro-oncology for his history of metastatic disease to the brain.   He will see a member of the nutritionist team while in the infusion room today.   The patient was advised to call immediately if she has any concerning symptoms in the interval. The patient voices understanding of current disease status and treatment options and is in agreement with the current care plan. All questions were answered. The patient knows to call the clinic with any problems, questions or concerns. We can certainly see the patient much sooner if necessary    Orders Placed This Encounter   Procedures   Ambulatory referral to Radiation Oncology    Referral Priority:   Routine    Referral Type:   Consultation    Referral Reason:   Specialty Services Required    Requested Specialty:   Radiation Oncology    Number of Visits Requested:   [place], PA-C 02/23/21  ADDENDUM: Hematology/Oncology Attending: I had a face-to-face encounter with the patient today.  I reviewed his record, lab and scan and recommended his care plan.  This is a very pleasant 65 years old African-American male with stage IV non-small cell lung cancer favoring adenocarcinoma diagnosed in August 2021 and presented with posterior right upper lobe lung nodule in addition to right hilar, infra hilar, subcarinal and right paratracheal lymphadenopathy as well as soft tissue nodule in the skin and subcutaneous fat of the left axilla and 2 small metastatic brain lesions treated with SRS. The patient has no actionable mutation and he started treatment initially with carboplatin for AUC of 5, Alimta 500 Mg/M2 and Keytruda 200 Mg IV every 3 weeks for 4 cycles and currently on maintenance treatment initially with Alimta and Keytruda followed by treatment with single agent Keytruda secondary to renal insufficiency. The patient has been tolerating this treatment well with no concerning adverse effect except for occasional fatigue. He had repeat CT scan of the chest, abdomen pelvis performed recently.  I personally and independently reviewed the scan and discussed the results with the patient today. Has a scan showed stable disease except for enlargement of the right upper lobe pulmonary mass concerning for local disease progression in that area. I recommended for the patient to continue his current treatment with maintenance Keytruda but will refer him to Dr. [name] for consideration of SBRT to the enlarging right upper lobe lung mass since it is only solitary disease progression. The patient is in  agreement with the current plan. He will come back for follow-up visit in 3 weeks for evaluation before the next cycle of his treatment. He was advised to call immediately if he has any other concerning symptoms in the interval. he total time spent in the appointment was 34 minutes. Disclaimer: This note was dictated with voice recognition software. Similar sounding words can inadvertently be transcribed and may be missed upon review. Eilleen Kempf, MD 02/23/21

## 2021-02-23 NOTE — Patient Instructions (Signed)
[facility]  Discharge Instructions: ?Thank you for choosing [facility] to provide your oncology and hematology care.  ? ?If you have a lab appointment with the Cancer Center, please go directly to the Cancer Center and check in at the registration area. ?  ?Wear comfortable clothing and clothing appropriate for easy access to any Portacath or PICC line.  ? ?We strive to give you quality time with your provider. You may need to reschedule your appointment if you arrive late (15 or more minutes).  Arriving late affects you and other patients whose appointments are after yours.  Also, if you miss three or more appointments without notifying the office, you may be dismissed from the clinic at the provider?s discretion.    ?  ?For prescription refill requests, have your pharmacy contact our office and allow 72 hours for refills to be completed.   ? ?Today you received the following chemotherapy and/or immunotherapy agents: Keytruda ?  ?To help prevent nausea and vomiting after your treatment, we encourage you to take your nausea medication as directed. ? ?BELOW ARE SYMPTOMS THAT SHOULD BE REPORTED IMMEDIATELY: ?*FEVER GREATER THAN 100.4 F (38 ?C) OR HIGHER ?*CHILLS OR SWEATING ?*NAUSEA AND VOMITING THAT IS NOT CONTROLLED WITH YOUR NAUSEA MEDICATION ?*UNUSUAL SHORTNESS OF BREATH ?*UNUSUAL BRUISING OR BLEEDING ?*URINARY PROBLEMS (pain or burning when urinating, or frequent urination) ?*BOWEL PROBLEMS (unusual diarrhea, constipation, pain near the anus) ?TENDERNESS IN MOUTH AND THROAT WITH OR WITHOUT PRESENCE OF ULCERS (sore throat, sores in mouth, or a toothache) ?UNUSUAL RASH, SWELLING OR PAIN  ?UNUSUAL VAGINAL DISCHARGE OR ITCHING  ? ?Items with * indicate a potential emergency and should be followed up as soon as possible or go to the Emergency Department if any problems should occur. ? ?Please show the CHEMOTHERAPY ALERT CARD or IMMUNOTHERAPY ALERT CARD at check-in to the  Emergency Department and triage nurse. ? ?Should you have questions after your visit or need to cancel or reschedule your appointment, please contact [facility]  Dept: [phone]  and follow the prompts.  Office hours are 8:00 a.m. to 4:30 p.m. Monday - Friday. Please note that voicemails left after 4:00 p.m. may not be returned until the following business day.  We are closed weekends and major holidays. You have access to a nurse at all times for urgent questions. Please call the main number to the clinic Dept: [phone] and follow the prompts. ? ? ?For any non-urgent questions, you may also contact your provider using MyChart. We now offer e-Visits for anyone 18 and older to request care online for non-urgent symptoms. For details visit [email or web address]. ?  ?Also download the MyChart app! Go to the app store, search "MyChart", open the app, select [facility], and log in with your MyChart username and password. ? ?Due to Covid, a mask is required upon entering the hospital/clinic. If you do not have a mask, one will be given to you upon arrival. For doctor visits, patients may have 1 support person aged 18 or older with them. For treatment visits, patients cannot have anyone with them due to current Covid guidelines and our immunocompromised population.  ? ?

## 2021-02-24 ENCOUNTER — Encounter: Payer: Self-pay | Admitting: Internal Medicine

## 2021-02-24 LAB — TSH: TSH: 1.158 u[IU]/mL (ref 0.320–4.118)

## 2021-02-24 NOTE — Progress Notes (Signed)
Nutrition Follow-up:  Patient with stage IV non-small cell lung cancer. He is receiving carboplatin/pemetrexed/pembrolizumab  Met with patient in infusion. He reports tolerating treatment, denies nutrition impact symptoms. Patient has a good appetite and is eating well. Patient reports 3 meals and drinking 2 Ensure Plus daily. He is asking for case of Ensure today. Yesterday patient ate bowl of cereal, ham and cheese sandwich, hamburger, pork chop, rice, corn and drank 2 Ensure. Patient reports he has been more active, feels stronger and lifting 20 lb weights at home.   Medications: reviewed  Labs: reviewed  Anthropometrics: Weight 183 lb 1.6 oz today increased from 181.9 lbs on 6/22 decreased from 189.7 lbs 4/20     NUTRITION DIAGNOSIS: Food and nutrition knowledge related deficit stable   INTERVENTION:  Continue eating high calorie, high protein foods for weight maintenance Encouraged snacks in between meals with focus on protein, handout with recipes provided Continue drinking 1-2 Ensure Plus daily Complimentary case of Ensure Plus provided today at pt request Encouraged continued activity as tolerated to maintain muscle mass   MONITORING, EVALUATION, GOAL: weight trends, intake  NEXT VISIT: No follow-up scheduled at this time

## 2021-03-07 ENCOUNTER — Telehealth: Payer: Self-pay | Admitting: Medical Oncology

## 2021-03-07 NOTE — Telephone Encounter (Signed)
Radiation appt  He has not heard from xrt re: appt. Phone message transferred to xrt.

## 2021-03-15 ENCOUNTER — Other Ambulatory Visit: Payer: Self-pay

## 2021-03-15 ENCOUNTER — Ambulatory Visit
Admission: RE | Admit: 2021-03-15 | Discharge: 2021-03-15 | Disposition: A | Discharge status: Home / Self Care | Payer: No Typology Code available for payment source | Source: Ambulatory Visit | Attending: Radiation Oncology | Admitting: Radiation Oncology

## 2021-03-15 ENCOUNTER — Encounter: Payer: Self-pay | Admitting: Radiation Oncology

## 2021-03-15 VITALS — BP 130/92 | HR 85 | Temp 97.4°F | Resp 20 | Wt 180.6 lb

## 2021-03-15 DIAGNOSIS — C7931 Secondary malignant neoplasm of brain: Secondary | ICD-10-CM

## 2021-03-15 DIAGNOSIS — C3411 Malignant neoplasm of upper lobe, right bronchus or lung: Secondary | ICD-10-CM

## 2021-03-15 NOTE — Progress Notes (Signed)
Radiation Oncology         [phone] ________________________________  Outpatient Follow Up     Name: Randy Andersen        MRN: [id number]  Date of Service: 03/15/2021 DOB: March 05, 1956  CC:  Randy Bears, MD     REFERRING PHYSICIAN: [name], MD   DIAGNOSIS: The primary encounter diagnosis was Malignant neoplasm of upper lobe of right lung ([place]). A diagnosis of Brain metastases [facility]) was also pertinent to this visit.   HISTORY OF PRESENT ILLNESS: Randy Andersen is a 65 y.o. 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 He received stereotactic radiosurgery to two targets in August 2021. He continues with Dr. Julien Nordmann on Carboplatin/Alimta/Keytruda and recent restaging CT scan on 02/07/21 showed interval change in the RUL measuring 4.7 x 3.2 cm (previously in April 2022 this was 2.7 x 2.1 cm). Stable subcentimeter nodules in the LUL and scattered throughout the lungs were noted. No other progressive disease was seen. His MRI brain on 02/18/21 was also negative for new disease. He's seen today to discuss possibilities of using stereotactic body radiotherapy (SBRT) to achieve local control in the RUL.    PREVIOUS RADIATION THERAPY:     03/18/20 SRS Treatment:   20 Gy in 1 fraction to 2 targets: PTV1 Rt Parietal 6 Andersen PTV2 Lt Parietal 6 Andersen   PAST MEDICAL HISTORY:  Past Medical History:  Diagnosis Date   Asthma    as a child   Chronic pain disorder    LT leg and foot   Heart murmur    as a child   Hypertension    Malignant neoplasm of upper lobe of right lung ([place])    Stroke ([place])    mini stroke - 2015, some tingling in fingers on right        PAST SURGICAL HISTORY: Past Surgical History:  Procedure Laterality Date   BUNIONECTOMY Left    had hammer toe surgery also and callus removed   BUNIONECTOMY Right    also had hammer toe surgery and callus removed   LEG SURGERY Left    PT reports he has pins placed in his Lt leg   ORIF  TIBIA PLATEAU  10/09/2012   Dr Randy Andersen   ORIF TIBIA PLATEAU Left 10/08/2012   Procedure: OPEN REDUCTION INTERNAL FIXATION (ORIF) TIBIAL PLATEAU;  Surgeon: Randy Killings, MD;  Location: [facility];  Service: Orthopedics;  Laterality: Left;   VIDEO BRONCHOSCOPY WITH ENDOBRONCHIAL NAVIGATION N/A 02/25/2020   Procedure: VIDEO BRONCHOSCOPY WITH ENDOBRONCHIAL NAVIGATION with biopsies;  Surgeon: Randy Gobble, MD;  Location: [facility] OR;  Service: Thoracic;  Laterality: N/A;   VIDEO BRONCHOSCOPY WITH ENDOBRONCHIAL ULTRASOUND N/A 02/25/2020   Procedure: VIDEO BRONCHOSCOPY WITH ENDOBRONCHIAL ULTRASOUND;  Surgeon: Randy Gobble, MD;  Location: [facility] OR;  Service: Thoracic;  Laterality: N/A;     FAMILY HISTORY:  Family History  Problem Relation Age of Onset   Cancer Mother        Intestinal metastatic to ovaries     SOCIAL HISTORY:  reports that he has been smoking cigarettes. He has a 7.00 pack-year smoking history. He has never used smokeless tobacco. He reports that he does not drink alcohol and does not use drugs. The patient is single and lives in [place].    ALLERGIES: Patient has no known allergies.   MEDICATIONS:  Current Outpatient Medications  Medication Sig Dispense Refill   amLODipine (NORVASC) 10 MG tablet Take 10 mg by mouth  daily.     Cholecalciferol 100 MCG (4000 UT) TABS Take 2 tablets by mouth daily.     doxylamine, Sleep, (UNISOM) 25 MG tablet Take 25 mg by mouth at bedtime as needed for sleep.     HYDROcodone-acetaminophen (NORCO) 10-325 MG tablet Take 1 tablet by mouth 2 (two) times daily.     oxybutynin (DITROPAN-XL) 10 MG 24 hr tablet Take by mouth.     phenazopyridine (PYRIDIUM) 200 MG tablet Take 1 tablet (200 mg total) by mouth 3 (three) times daily. 6 tablet 0   polyethylene glycol powder (GLYCOLAX/MIRALAX) 17 GM/SCOOP powder TAKE 17 GRAMS BY MOUTH DAILY (MIX WITH WATER OR OTHER LIQUID AS INSTRUCTED BEFORE DRINKING)     prochlorperazine (COMPAZINE) 10 MG tablet Take 1 tablet (10 mg  total) by mouth every 6 (six) hours as needed. 30 tablet 2   rivaroxaban (XARELTO) 20 MG TABS tablet TAKE 1 TABLET BY MOUTH ONCE A DAY WITH SUPPER 30 tablet 2   senna-docusate (SENOKOT-S) 8.6-50 MG tablet Take 2 tablets by mouth daily.     tadalafil (CIALIS) 20 MG tablet Take by mouth.     tamsulosin (FLOMAX) 0.4 MG CAPS capsule Take 1 capsule (0.4 mg total) by mouth daily. 30 capsule 2   temazepam (RESTORIL) 15 MG capsule Take 1 capsule (15 mg total) by mouth at bedtime as needed for sleep. Pt is veteran - Call pt to set up delivery 30 capsule 0   No current facility-administered medications for this encounter.     REVIEW OF SYSTEMS: The patient reports that overall he is doing well. He reports occasional productive cough with clear or white mucous but denies any hemoptysis or shortness of breath or chest pain. No other complaints are noted.  PHYSICAL EXAM:  Unable to assess due to encounter type.  ECOG = 0  0 - Asymptomatic (Fully active, able to carry on all predisease activities without restriction)  1 - Symptomatic but completely ambulatory (Restricted in physically strenuous activity but ambulatory and able to carry out work of a light or sedentary nature. For example, light housework, office work)  2 - Symptomatic, <50% in bed during the day (Ambulatory and capable of all self care but unable to carry out any work activities. Up and about more than 50% of waking hours)  3 - Symptomatic, >50% in bed, but not bedbound (Capable of only limited self-care, confined to bed or chair 50% or more of waking hours)  4 - Bedbound (Completely disabled. Cannot carry on any self-care. Totally confined to bed or chair)  5 - Death   Randy Andersen, Creech RH, Andersen DC, et al. [phone]). "Toxicity and response criteria of the [facility]". [place] Oncol. 5 (6): 649-55    LABORATORY DATA:  Lab Results  Component Value Date   WBC 4.5 02/23/2021   HGB 13.6 02/23/2021    HCT 40.2 02/23/2021   MCV 86.6 02/23/2021   PLT 180 02/23/2021   Lab Results  Component Value Date   NA 141 02/23/2021   K 4.1 02/23/2021   CL 110 02/23/2021   CO2 23 02/23/2021   Lab Results  Component Value Date   ALT 14 02/23/2021   AST 14 (L) 02/23/2021   ALKPHOS 107 02/23/2021   BILITOT 0.2 (L) 02/23/2021      RADIOGRAPHY: MR BRAIN W WO CONTRAST  Result Date: 02/19/2021 CLINICAL DATA:  Lung cancer follow-up EXAM: MRI HEAD WITHOUT AND WITH CONTRAST TECHNIQUE: Multiplanar, multiecho pulse sequences of  the brain and surrounding structures were obtained without and with intravenous contrast. CONTRAST:  32mL MULTIHANCE GADOBENATE DIMEGLUMINE 529 MG/ML IV SOLN COMPARISON:  11/25/2020 FINDINGS: BRAIN New Lesions: None. Larger lesions: None. Stable or Smaller lesions: High left frontal dot of enhancement only seen on sagittal postcontrast imaging is a site of previously treated disease that is stable. The questioned right perirolandic metastasis on prior is not re-identified. Other Brain findings: Remote bilateral subcortical perforator infarcts at the basal ganglia/deep white matter tracks. Mild chronic small vessel ischemic change in the hemispheric white matter. Brain volume is normal. No hydrocephalus or collection. Vascular: Normal flow voids and vascular enhancements Skull and upper cervical spine: Normal marrow signal. C2-3 non segmentation. Sinuses/Orbits: Negative IMPRESSION: Treated metastatic disease without new or increased lesion. Electronically Signed   By: Monte Fantasia M.D.   On: 02/19/2021 15:02        IMPRESSION/PLAN: 1. 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 The patient continues to follow with Dr. Mickeal Skinner for neurologic symptoms and for brain MRIs in surveillance. Dr. [name] discussed the findings from the patient's recent CT imaging and the option to consider stereotactic body radiotherapy  (SBRT) for more definitive control locally of his disease. He continues on Carboplatin/Alimta/Keytruda with Dr. Julien Nordmann.  Regarding SBRT, we discussed the risks, benefits, short, and long term effects of radiotherapy, as well as the curative intent, and the patient is interested in proceeding. Dr. [name] discusses the delivery and logistics of radiotherapy and anticipates a course of up to 10 fractions of radiotherapy due to the central location of his disease near the hilum. Written consent is obtained and placed in the chart, a copy was provided to the patient. He will receive a call from simulation staff to coordinate SBRT simulation. Dr. [name] anticipates starting radiotherapy on 03/23/21.  In a visit lasting 45 minutes, greater than 50% of the time was spent face to face discussing the patient's condition, in preparation for the discussion, and coordinating the patient's care.   The above documentation reflects my direct findings during this shared patient visit. Please see the separate note by Dr. [name] on this date for the remainder of the patient's plan of care.     Carola Rhine, PAC

## 2021-03-15 NOTE — Progress Notes (Addendum)
Patient reports doing well. Denies fatigue, pain, cough, shortness of breath, swallowing issues and skin changes. Patient expressed mild speech/ pronunciation issues.  BP (!) 130/92 (BP Location: Left Arm, Patient Position: Sitting, Cuff Size: Normal)   Pulse 85   Temp (!) 97.4 F (36.3 C) (Oral)   Resp 20   Wt 180 lb 9.6 oz (81.9 kg)   SpO2 100%   BMI 23.19 kg/m

## 2021-03-15 NOTE — Progress Notes (Signed)
[place] [address]  DIAGNOSIS: Stage IV non-small cell lung cancer, favored to be adenocarcinoma.  He presented with posterior right upper lobe nodule as well as right hilar, infrahilar, subcarinal, and right paratracheal lymphadenopathy.  He also has a 1.4 cm x 1 cm soft tissue nodule in the skin/subcutaneous fat in the left axilla.  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  He was diagnosed in August 2021   Molecular Biomarkers: BIOMARKER(S)         % CFDNA OR AMPLIFICATION       ASSOCIATED FDA-APPROVED THERAPIES        CLINICAL TRIAL AVAILABILITY [id number] 2.9% None    Yes [id number] 0.4% None    Yes [id number] 0.2% None    Yes  PRIOR THERAPY:  SRS to the small metastatic brain lesion under the care of Dr. [name] on 03/18/20    CURRENT THERAPY:  1) Palliative systemic chemotherapy with carboplatin for an AUC of 5, Alimta 500 mg/m2, and Keytruda 200 mg IV every 3 weeks. First dose expected on 04/07/20. Status post 15 cycles. Starting from cycle #5 he will be on maintenance treatment with Alimta and Keytruda every 3 weeks. His dose of Alimta was reduced to 400 mg/m2. Alimta removed starting from cycle #11 due to renal insuffiencey.   2) SBRT to the enlarging right upper lobe lung mass under the care of Dr. [name].   INTERVAL HISTORY: Randy Andersen 65 y.o. male returns to the clinic today for a follow up visit. The patient is feeling well today without any concerning complaints. He is currently undergoing single agent immunotherapy with Keytruda. He is tolerating his treatment well without any adverse side effects. Alimta was removed from his treatment plan due to renal insufficiency. At his last appointment, he was referred to radiation oncology for an enlarging right upper lobe lung mass. He started his radiation treatment and has 7 more remaining. He has  been drinking 2 ensures per day. Nutrition saw him at his last appointment. His weight is fairly stable. He denies fevers, chills, recent night sweats, or shortness of breath. He reports a mild baseline cough which produces sputum. He denies nausea, vomiting, diarrhea, or constipation. Denies chest pain or hemoptysis. He denies rashes or skin changes. He denies headaches or visual changes. He is followed by neuro-oncology for his history of metastatic disease to the brain. He is here for evaluation and repeat blood work before starting cycle #17.    MEDICAL HISTORY: Past Medical History:  Diagnosis Date   Asthma    as a child   Chronic pain disorder    LT leg and foot   Heart murmur    as a child   Hypertension    Malignant neoplasm of upper lobe of right lung ([facility])    Stroke ([facility])    mini stroke - 2015, some tingling in fingers on right     ALLERGIES:  has No Known Allergies.  MEDICATIONS:  Current Outpatient Medications  Medication Sig Dispense Refill   amLODipine (NORVASC) 10 MG tablet Take 10 mg by mouth daily.     Cholecalciferol 100 MCG (4000 UT) TABS Take 2 tablets by mouth daily.     doxylamine, Sleep, (UNISOM) 25 MG tablet Take 25 mg by mouth at bedtime as needed for sleep.     HYDROcodone-acetaminophen (NORCO) 10-325 MG tablet Take 1 tablet by mouth 2 (  two) times daily.     oxybutynin (DITROPAN-XL) 10 MG 24 hr tablet Take by mouth.     phenazopyridine (PYRIDIUM) 200 MG tablet Take 1 tablet (200 mg total) by mouth 3 (three) times daily. 6 tablet 0   polyethylene glycol powder (GLYCOLAX/MIRALAX) 17 GM/SCOOP powder      prochlorperazine (COMPAZINE) 10 MG tablet Take 1 tablet (10 mg total) by mouth every 6 (six) hours as needed. 30 tablet 2   rivaroxaban (XARELTO) 20 MG TABS tablet TAKE 1 TABLET BY MOUTH ONCE A DAY WITH SUPPER 30 tablet 2   senna-docusate (SENOKOT-S) 8.6-50 MG tablet Take 2 tablets by mouth daily.     tadalafil (CIALIS) 20 MG tablet Take by mouth.      tamsulosin (FLOMAX) 0.4 MG CAPS capsule Take 1 capsule (0.4 mg total) by mouth daily. 30 capsule 2   temazepam (RESTORIL) 15 MG capsule Take 1 capsule (15 mg total) by mouth at bedtime as needed for sleep. Pt is veteran - Call pt to set up delivery 30 capsule 0   No current facility-administered medications for this visit.    SURGICAL HISTORY:  Past Surgical History:  Procedure Laterality Date   BUNIONECTOMY Left    had hammer toe surgery also and callus removed   BUNIONECTOMY Right    also had hammer toe surgery and callus removed   LEG SURGERY Left    PT reports he has pins placed in his Lt leg   ORIF TIBIA PLATEAU  10/09/2012   Dr Lorin Mercy   ORIF TIBIA PLATEAU Left 10/08/2012   Procedure: OPEN REDUCTION INTERNAL FIXATION (ORIF) TIBIAL PLATEAU;  Surgeon: Marybelle Killings, MD;  Location: [facility];  Service: Orthopedics;  Laterality: Left;   VIDEO BRONCHOSCOPY WITH ENDOBRONCHIAL NAVIGATION N/A 02/25/2020   Procedure: VIDEO BRONCHOSCOPY WITH ENDOBRONCHIAL NAVIGATION with biopsies;  Surgeon: Collene Gobble, MD;  Location: [facility];  Service: Thoracic;  Laterality: N/A;   VIDEO BRONCHOSCOPY WITH ENDOBRONCHIAL ULTRASOUND N/A 02/25/2020   Procedure: VIDEO BRONCHOSCOPY WITH ENDOBRONCHIAL ULTRASOUND;  Surgeon: Collene Gobble, MD;  Location[facility];  Service: Thoracic;  Laterality: N/A;    REVIEW OF SYSTEMS:   Review of Systems  Constitutional: Negative for appetite change, chills, fatigue, fever and unexpected weight change.  HENT: Negative for mouth sores, nosebleeds, sore throat and trouble swallowing.   Eyes: Negative for eye problems and icterus.  Respiratory: Positive for mild baseline cough. Negative for hemoptysis, shortness of breath and wheezing.   Cardiovascular: Negative for chest pain and leg swelling.  Gastrointestinal: Negative for abdominal pain, constipation, diarrhea, nausea and vomiting.  Genitourinary: Negative for bladder incontinence, difficulty urinating, dysuria, frequency and  hematuria.   Musculoskeletal: Negative for back pain, gait problem, neck pain and neck stiffness.  Skin: Negative for itching and rash.  Neurological: Negative for dizziness, extremity weakness, gait problem, headaches, light-headedness and seizures.  Hematological: Negative for adenopathy. Does not bruise/bleed easily.  Psychiatric/Behavioral: Negative for confusion, depression and sleep disturbance. The patient is not nervous/anxious.     PHYSICAL EXAMINATION:  Blood pressure (!) 151/85, pulse 75, temperature 98.1 F (36.7 C), temperature source Oral, resp. rate 17, weight 182 lb 3.2 oz (82.6 kg), SpO2 100 %.  ECOG PERFORMANCE STATUS: 1  Physical Exam  Constitutional: Oriented to person, place, and time and well-developed, well-nourished, and in no distress.  HENT:  Head: Normocephalic and atraumatic.  Mouth/Throat: Oropharynx is clear and moist. No oropharyngeal exudate.  Eyes: Conjunctivae are normal. Right eye exhibits no discharge. Left eye exhibits  no discharge. No scleral icterus.  Neck: Normal range of motion. Neck supple.  Cardiovascular: Normal rate, regular rhythm, normal heart sounds and intact distal pulses.   Pulmonary/Chest: Effort normal and breath sounds normal. No respiratory distress. No wheezes. No rales.  Abdominal: Soft. Bowel sounds are normal. Exhibits no distension and no mass. There is no tenderness.  Musculoskeletal: Normal range of motion. Exhibits no edema.  Lymphadenopathy:    No cervical adenopathy.  Neurological: Alert and oriented to person, place, and time. Exhibits normal muscle tone. Gait normal. Coordination normal.  Skin: Skin is warm and dry. No rash noted. Not diaphoretic. No erythema. No pallor.  Psychiatric: Mood, memory and judgment normal.  Vitals reviewed.  LABORATORY DATA: Lab Results  Component Value Date   WBC 5.4 03/17/2021   HGB 14.4 03/17/2021   HCT 43.7 03/17/2021   MCV 85.9 03/17/2021   PLT 200 03/17/2021      Chemistry       Component Value Date/Time   NA 140 03/17/2021 0847   K 3.8 03/17/2021 0847   CL 108 03/17/2021 0847   CO2 25 03/17/2021 0847   BUN 19 03/17/2021 0847   CREATININE 1.65 (H) 03/17/2021 0847      Component Value Date/Time   CALCIUM 10.0 03/17/2021 0847   ALKPHOS 115 03/17/2021 0847   AST 14 (L) 03/17/2021 0847   ALT 13 03/17/2021 0847   BILITOT 0.3 03/17/2021 0847       RADIOGRAPHIC STUDIES:  MR BRAIN W WO CONTRAST  Result Date: 02/19/2021 CLINICAL DATA:  Lung cancer follow-up EXAM: MRI HEAD WITHOUT AND WITH CONTRAST TECHNIQUE: Multiplanar, multiecho pulse sequences of the brain and surrounding structures were obtained without and with intravenous contrast. CONTRAST:  22mL MULTIHANCE GADOBENATE DIMEGLUMINE 529 MG/ML IV SOLN COMPARISON:  11/25/2020 FINDINGS: BRAIN New Lesions: None. Larger lesions: None. Stable or Smaller lesions: High left frontal dot of enhancement only seen on sagittal postcontrast imaging is a site of previously treated disease that is stable. The questioned right perirolandic metastasis on prior is not re-identified. Other Brain findings: Remote bilateral subcortical perforator infarcts at the basal ganglia/deep white matter tracks. Mild chronic small vessel ischemic change in the hemispheric white matter. Brain volume is normal. No hydrocephalus or collection. Vascular: Normal flow voids and vascular enhancements Skull and upper cervical spine: Normal marrow signal. C2-3 non segmentation. Sinuses/Orbits: Negative IMPRESSION: Treated metastatic disease without new or increased lesion. Electronically Signed   By: Monte Fantasia M.D.   On: 02/19/2021 15:02     ASSESSMENT/PLAN:  This is a very pleasant 65 year old African-American male with stage IV non-small cell lung cancer, favored to be adenocarcinoma.  He presented with posterior right upper lobe nodule as well as right hilar, infrahilar, subcarinal, and right paratracheal lymphadenopathy.  He also has a 1.4 cm x  1 cm soft tissue nodule in the skin/subcutaneous fat in the left axilla.  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  He was diagnosed in August 2021.  Molecular studies by [facility] are negative for any actionable mutations.   The patient completed SRS to the small metastatic brain lesion under the care of Dr. [name] on 03/18/20   The patient is currently undergoing palliative systemic chemotherapy with carboplatin for an AUC of 5, Alimta 500 mg per metered squared, and Keytruda 200 mg IV every 3 weeks.  He is status post 16 cycles. The dose of alimta was reduced to 400 mg/m2 due to renal insufficiency. Alimta  was ultimately removed from the treatment plan with cycle #11 due to renal insufficiency.   He is planning on undergoing SBRT to the enlarging right upper lobe lung mass under the care of Dr. [name]. He had his first treatment today. Last treatment expected on 04/04/21  Labs were reviewed. Recommend that he proceed with cycle #17 today as scheduled.   We will see him back for a follow up visit in 3 weeks for evaluation before starting cycle #18   He will continue to follow with neuro-oncology for his history of metastatic disease to the brain.   The patient was advised to call immediately if he has any concerning symptoms in the interval. The patient voices understanding of current disease status and treatment options and is in agreement with the current care plan. All questions were answered. The patient knows to call the clinic with any problems, questions or concerns. We can certainly see the patient much sooner if necessary     Orders Placed This Encounter  Procedures   TSH    Standing Status:   Standing    Number of Occurrences:   18    Standing Expiration Date:   03/17/2022      The total time spent in the appointment was 20-29 minutes in this encounter  Keianna Signer L Serafina Topham, PA-C 03/17/21

## 2021-03-17 ENCOUNTER — Inpatient Hospital Stay: Payer: No Typology Code available for payment source

## 2021-03-17 ENCOUNTER — Other Ambulatory Visit: Payer: Self-pay

## 2021-03-17 ENCOUNTER — Inpatient Hospital Stay (HOSPITAL_BASED_OUTPATIENT_CLINIC_OR_DEPARTMENT_OTHER): Payer: No Typology Code available for payment source | Admitting: Physician Assistant

## 2021-03-17 ENCOUNTER — Ambulatory Visit
Admission: RE | Admit: 2021-03-17 | Discharge: 2021-03-17 | Disposition: A | Discharge status: Home / Self Care | Payer: Medicare PPO | Source: Ambulatory Visit | Attending: Radiation Oncology | Admitting: Radiation Oncology

## 2021-03-17 ENCOUNTER — Encounter: Payer: Self-pay | Admitting: Physician Assistant

## 2021-03-17 VITALS — BP 157/90 | HR 68 | Temp 98.2°F | Resp 18 | Wt 181.8 lb

## 2021-03-17 VITALS — BP 151/85 | HR 75 | Temp 98.1°F | Resp 17 | Wt 182.2 lb

## 2021-03-17 DIAGNOSIS — Z5112 Encounter for antineoplastic immunotherapy: Secondary | ICD-10-CM

## 2021-03-17 DIAGNOSIS — C3411 Malignant neoplasm of upper lobe, right bronchus or lung: Secondary | ICD-10-CM

## 2021-03-17 DIAGNOSIS — Z510 Encounter for antineoplastic radiation therapy: Secondary | ICD-10-CM | POA: Insufficient documentation

## 2021-03-17 DIAGNOSIS — C7931 Secondary malignant neoplasm of brain: Secondary | ICD-10-CM | POA: Insufficient documentation

## 2021-03-17 LAB — CMP (CANCER CENTER ONLY)
ALT: 13 U/L (ref 0–44)
AST: 14 U/L — ABNORMAL LOW (ref 15–41)
Albumin: 3.9 g/dL (ref 3.5–5.0)
Alkaline Phosphatase: 115 U/L (ref 38–126)
Anion gap: 7 (ref 5–15)
BUN: 19 mg/dL (ref 8–23)
CO2: 25 mmol/L (ref 22–32)
Calcium: 10 mg/dL (ref 8.9–10.3)
Chloride: 108 mmol/L (ref 98–111)
Creatinine: 1.65 mg/dL — ABNORMAL HIGH (ref 0.61–1.24)
GFR, Estimated: 46 mL/min — ABNORMAL LOW (ref >60)
Glucose, Bld: 93 mg/dL (ref 70–99)
Potassium: 3.8 mmol/L (ref 3.5–5.1)
Sodium: 140 mmol/L (ref 135–145)
Total Bilirubin: 0.3 mg/dL (ref 0.3–1.2)
Total Protein: 8.1 g/dL (ref 6.5–8.1)

## 2021-03-17 LAB — CBC WITH DIFFERENTIAL (CANCER CENTER ONLY)
Abs Immature Granulocytes: 0.02 10*3/uL (ref 0.00–0.07)
Basophils Absolute: 0 10*3/uL (ref 0.0–0.1)
Basophils Relative: 1 %
Eosinophils Absolute: 0.2 10*3/uL (ref 0.0–0.5)
Eosinophils Relative: 3 %
HCT: 43.7 % (ref 39.0–52.0)
Hemoglobin: 14.4 g/dL (ref 13.0–17.0)
Immature Granulocytes: 0 %
Lymphocytes Relative: 32 %
Lymphs Abs: 1.7 10*3/uL (ref 0.7–4.0)
MCH: 28.3 pg (ref 26.0–34.0)
MCHC: 33 g/dL (ref 30.0–36.0)
MCV: 85.9 fL (ref 80.0–100.0)
Monocytes Absolute: 0.6 10*3/uL (ref 0.1–1.0)
Monocytes Relative: 11 %
Neutro Abs: 2.9 10*3/uL (ref 1.7–7.7)
Neutrophils Relative %: 53 %
Platelet Count: 200 10*3/uL (ref 150–400)
RBC: 5.09 MIL/uL (ref 4.22–5.81)
RDW: 13 % (ref 11.5–15.5)
WBC Count: 5.4 10*3/uL (ref 4.0–10.5)
nRBC: 0 % (ref 0.0–0.2)

## 2021-03-17 LAB — TSH: TSH: 1.682 u[IU]/mL (ref 0.320–4.118)

## 2021-03-17 MED ORDER — SODIUM CHLORIDE 0.9 % IV SOLN
200.0000 mg | Freq: Once | INTRAVENOUS | Status: AC
  Administered 2021-03-17: 200 mg via INTRAVENOUS
  Filled 2021-03-17: qty 8

## 2021-03-17 MED ORDER — SODIUM CHLORIDE 0.9 % IV SOLN: Freq: Once | INTRAVENOUS | Status: AC

## 2021-03-17 NOTE — Progress Notes (Signed)
Per [name] Heilingoetter, PA, ok to treat with elevated creatinine

## 2021-03-17 NOTE — Patient Instructions (Signed)
[facility]  Discharge Instructions: Thank you for choosing [facility] to provide your oncology and hematology care.   If you have a lab appointment with the [facility], please go directly to the [facility] and check in at the registration area.   Wear comfortable clothing and clothing appropriate for easy access to any Portacath or PICC line.   We strive to give you quality time with your provider. You may need to reschedule your appointment if you arrive late (15 or more minutes).  Arriving late affects you and other patients whose appointments are after yours.  Also, if you miss three or more appointments without notifying the office, you may be dismissed from the clinic at the provider's discretion.      For prescription refill requests, have your pharmacy contact our office and allow 72 hours for refills to be completed.    Today you received the following chemotherapy and/or immunotherapy agents Beryle Flock      To help prevent nausea and vomiting after your treatment, we encourage you to take your nausea medication as directed.  BELOW ARE SYMPTOMS THAT SHOULD BE REPORTED IMMEDIATELY: *FEVER GREATER THAN 100.4 F (38 C) OR HIGHER *CHILLS OR SWEATING *NAUSEA AND VOMITING THAT IS NOT CONTROLLED WITH YOUR NAUSEA MEDICATION *UNUSUAL SHORTNESS OF BREATH *UNUSUAL BRUISING OR BLEEDING *URINARY PROBLEMS (pain or burning when urinating, or frequent urination) *BOWEL PROBLEMS (unusual diarrhea, constipation, pain near the anus) TENDERNESS IN MOUTH AND THROAT WITH OR WITHOUT PRESENCE OF ULCERS (sore throat, sores in mouth, or a toothache) UNUSUAL RASH, SWELLING OR PAIN  UNUSUAL VAGINAL DISCHARGE OR ITCHING   Items with * indicate a potential emergency and should be followed up as soon as possible or go to the Emergency Department if any problems should occur.  Please show the CHEMOTHERAPY ALERT CARD or IMMUNOTHERAPY ALERT CARD at check-in to  the Emergency Department and triage nurse.  Should you have questions after your visit or need to cancel or reschedule your appointment, please contact [facility]  Dept: [phone]  and follow the prompts.  Office hours are 8:00 a.m. to 4:30 p.m. Monday - Friday. Please note that voicemails left after 4:00 p.m. may not be returned until the following business day.  We are closed weekends and major holidays. You have access to a nurse at all times for urgent questions. Please call the main number to the clinic Dept: [phone] and follow the prompts.   For any non-urgent questions, you may also contact your provider using MyChart. We now offer e-Visits for anyone 48 and older to request care online for non-urgent symptoms. For details visit [email or web address].   Also download the MyChart app! Go to the app store, search "MyChart", open the app, select [facility], and log in with your MyChart username and password.  Due to Covid, a mask is required upon entering the hospital/clinic. If you do not have a mask, one will be given to you upon arrival. For doctor visits, patients may have 1 support person aged 80 or older with them. For treatment visits, patients cannot have anyone with them due to current Covid guidelines and our immunocompromised population.

## 2021-03-22 DIAGNOSIS — Z510 Encounter for antineoplastic radiation therapy: Secondary | ICD-10-CM | POA: Diagnosis not present

## 2021-03-23 ENCOUNTER — Other Ambulatory Visit: Payer: Self-pay

## 2021-03-23 ENCOUNTER — Ambulatory Visit
Admission: RE | Admit: 2021-03-23 | Discharge: 2021-03-23 | Disposition: A | Discharge status: Home / Self Care | Payer: Medicare PPO | Source: Ambulatory Visit | Attending: Radiation Oncology | Admitting: Radiation Oncology

## 2021-03-23 DIAGNOSIS — Z510 Encounter for antineoplastic radiation therapy: Secondary | ICD-10-CM | POA: Diagnosis not present

## 2021-03-24 ENCOUNTER — Ambulatory Visit
Admission: RE | Admit: 2021-03-24 | Discharge: 2021-03-24 | Disposition: A | Discharge status: Home / Self Care | Payer: Medicare PPO | Source: Ambulatory Visit | Attending: Radiation Oncology | Admitting: Radiation Oncology

## 2021-03-24 DIAGNOSIS — Z510 Encounter for antineoplastic radiation therapy: Secondary | ICD-10-CM | POA: Diagnosis not present

## 2021-03-24 DIAGNOSIS — C7931 Secondary malignant neoplasm of brain: Secondary | ICD-10-CM | POA: Insufficient documentation

## 2021-03-24 DIAGNOSIS — C3411 Malignant neoplasm of upper lobe, right bronchus or lung: Secondary | ICD-10-CM | POA: Insufficient documentation

## 2021-03-25 ENCOUNTER — Ambulatory Visit
Admission: RE | Admit: 2021-03-25 | Discharge: 2021-03-25 | Disposition: A | Discharge status: Home / Self Care | Payer: Medicare PPO | Source: Ambulatory Visit | Attending: Radiation Oncology | Admitting: Radiation Oncology

## 2021-03-25 DIAGNOSIS — Z510 Encounter for antineoplastic radiation therapy: Secondary | ICD-10-CM | POA: Diagnosis not present

## 2021-03-29 ENCOUNTER — Other Ambulatory Visit: Payer: Self-pay

## 2021-03-29 ENCOUNTER — Ambulatory Visit
Admission: RE | Admit: 2021-03-29 | Discharge: 2021-03-29 | Disposition: A | Discharge status: Home / Self Care | Payer: Medicare PPO | Source: Ambulatory Visit | Attending: Radiation Oncology | Admitting: Radiation Oncology

## 2021-03-29 DIAGNOSIS — Z510 Encounter for antineoplastic radiation therapy: Secondary | ICD-10-CM | POA: Diagnosis not present

## 2021-03-30 ENCOUNTER — Ambulatory Visit: Payer: Medicare PPO | Admitting: Radiation Oncology

## 2021-03-31 ENCOUNTER — Ambulatory Visit: Payer: Medicare PPO | Admitting: Radiation Oncology

## 2021-04-01 ENCOUNTER — Other Ambulatory Visit: Payer: Self-pay

## 2021-04-01 ENCOUNTER — Ambulatory Visit
Admission: RE | Admit: 2021-04-01 | Discharge: 2021-04-01 | Disposition: A | Discharge status: Home / Self Care | Payer: Medicare PPO | Source: Ambulatory Visit | Attending: Radiation Oncology | Admitting: Radiation Oncology

## 2021-04-01 DIAGNOSIS — Z510 Encounter for antineoplastic radiation therapy: Secondary | ICD-10-CM | POA: Diagnosis not present

## 2021-04-04 ENCOUNTER — Ambulatory Visit
Admission: RE | Admit: 2021-04-04 | Discharge: 2021-04-04 | Disposition: A | Discharge status: Home / Self Care | Payer: Medicare PPO | Source: Ambulatory Visit | Attending: Radiation Oncology | Admitting: Radiation Oncology

## 2021-04-04 ENCOUNTER — Other Ambulatory Visit: Payer: Self-pay

## 2021-04-04 DIAGNOSIS — Z510 Encounter for antineoplastic radiation therapy: Secondary | ICD-10-CM | POA: Diagnosis not present

## 2021-04-05 ENCOUNTER — Inpatient Hospital Stay (HOSPITAL_BASED_OUTPATIENT_CLINIC_OR_DEPARTMENT_OTHER): Payer: No Typology Code available for payment source | Admitting: Internal Medicine

## 2021-04-05 ENCOUNTER — Ambulatory Visit
Admission: RE | Admit: 2021-04-05 | Discharge: 2021-04-05 | Disposition: A | Discharge status: Home / Self Care | Payer: Medicare PPO | Source: Ambulatory Visit | Attending: Radiation Oncology | Admitting: Radiation Oncology

## 2021-04-05 ENCOUNTER — Inpatient Hospital Stay: Payer: No Typology Code available for payment source

## 2021-04-05 ENCOUNTER — Inpatient Hospital Stay: Payer: No Typology Code available for payment source | Attending: Internal Medicine

## 2021-04-05 VITALS — BP 149/85

## 2021-04-05 VITALS — BP 134/92 | HR 75 | Temp 96.5°F | Resp 18 | Ht 74.0 in | Wt 179.0 lb

## 2021-04-05 DIAGNOSIS — C7931 Secondary malignant neoplasm of brain: Secondary | ICD-10-CM | POA: Diagnosis not present

## 2021-04-05 DIAGNOSIS — C3411 Malignant neoplasm of upper lobe, right bronchus or lung: Secondary | ICD-10-CM

## 2021-04-05 DIAGNOSIS — Z79899 Other long term (current) drug therapy: Secondary | ICD-10-CM | POA: Insufficient documentation

## 2021-04-05 DIAGNOSIS — Z5112 Encounter for antineoplastic immunotherapy: Secondary | ICD-10-CM | POA: Diagnosis present

## 2021-04-05 DIAGNOSIS — Z510 Encounter for antineoplastic radiation therapy: Secondary | ICD-10-CM | POA: Diagnosis not present

## 2021-04-05 DIAGNOSIS — C3490 Malignant neoplasm of unspecified part of unspecified bronchus or lung: Secondary | ICD-10-CM

## 2021-04-05 LAB — CMP (CANCER CENTER ONLY)
ALT: 22 U/L (ref 0–44)
AST: 19 U/L (ref 15–41)
Albumin: 3.7 g/dL (ref 3.5–5.0)
Alkaline Phosphatase: 112 U/L (ref 38–126)
Anion gap: 9 (ref 5–15)
BUN: 14 mg/dL (ref 8–23)
CO2: 23 mmol/L (ref 22–32)
Calcium: 9.8 mg/dL (ref 8.9–10.3)
Chloride: 108 mmol/L (ref 98–111)
Creatinine: 1.55 mg/dL — ABNORMAL HIGH (ref 0.61–1.24)
GFR, Estimated: 49 mL/min — ABNORMAL LOW (ref >60)
Glucose, Bld: 96 mg/dL (ref 70–99)
Potassium: 4.2 mmol/L (ref 3.5–5.1)
Sodium: 140 mmol/L (ref 135–145)
Total Bilirubin: 0.3 mg/dL (ref 0.3–1.2)
Total Protein: 7.8 g/dL (ref 6.5–8.1)

## 2021-04-05 LAB — CBC WITH DIFFERENTIAL (CANCER CENTER ONLY)
Abs Immature Granulocytes: 0.01 10*3/uL (ref 0.00–0.07)
Basophils Absolute: 0 10*3/uL (ref 0.0–0.1)
Basophils Relative: 1 %
Eosinophils Absolute: 0.1 10*3/uL (ref 0.0–0.5)
Eosinophils Relative: 2 %
HCT: 41.8 % (ref 39.0–52.0)
Hemoglobin: 13.8 g/dL (ref 13.0–17.0)
Immature Granulocytes: 0 %
Lymphocytes Relative: 21 %
Lymphs Abs: 0.8 10*3/uL (ref 0.7–4.0)
MCH: 28.1 pg (ref 26.0–34.0)
MCHC: 33 g/dL (ref 30.0–36.0)
MCV: 85.1 fL (ref 80.0–100.0)
Monocytes Absolute: 0.5 10*3/uL (ref 0.1–1.0)
Monocytes Relative: 14 %
Neutro Abs: 2.4 10*3/uL (ref 1.7–7.7)
Neutrophils Relative %: 62 %
Platelet Count: 174 10*3/uL (ref 150–400)
RBC: 4.91 MIL/uL (ref 4.22–5.81)
RDW: 13.2 % (ref 11.5–15.5)
WBC Count: 3.8 10*3/uL — ABNORMAL LOW (ref 4.0–10.5)
nRBC: 0 % (ref 0.0–0.2)

## 2021-04-05 LAB — TSH: TSH: 1.285 u[IU]/mL (ref 0.350–4.500)

## 2021-04-05 MED ORDER — SODIUM CHLORIDE 0.9 % IV SOLN
200.0000 mg | Freq: Once | INTRAVENOUS | Status: AC
  Administered 2021-04-05: 200 mg via INTRAVENOUS
  Filled 2021-04-05: qty 8

## 2021-04-05 MED ORDER — SODIUM CHLORIDE 0.9 % IV SOLN: Freq: Once | INTRAVENOUS | Status: AC

## 2021-04-05 NOTE — Progress Notes (Signed)
Ok to treat with elevated SCR per Dr.Mohamed

## 2021-04-05 NOTE — Progress Notes (Signed)
[facility] [phone]   Fax:(336) [facility] [address] [place] [address]  DIAGNOSIS: Stage IV non-small cell lung cancer, favored to be adenocarcinoma.  He presented with posterior right upper lobe nodule as well as right hilar, infrahilar, subcarinal, and right paratracheal lymphadenopathy.  He also has a 1.4 cm x 1 cm soft tissue nodule in the skin/subcutaneous fat in the left axilla.  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  He was diagnosed in August 2021   Molecular Biomarkers: BIOMARKER(S)         % CFDNA OR AMPLIFICATION       ASSOCIATED FDA-APPROVED THERAPIES        CLINICAL TRIAL AVAILABILITY [id number] 2.9% None    Yes [id number] 0.4% None    Yes [id number] 0.2% None    Yes   PRIOR THERAPY:  1) SRS to the small metastatic brain lesion under the care of Dr. [name] on 03/18/20   CURRENT THERAPY:  1) Palliative systemic chemotherapy with carboplatin for an AUC of 5, Alimta 500 mg/m2, and Keytruda 200 mg IV every 3 weeks. First dose expected on 04/07/20.  Status post 17 cycles.  Starting from cycle #5 he will be on maintenance treatment with Alimta and Keytruda every 3 weeks.  For the last few cycles he is currently on maintenance treatment with single agent Keytruda. 2) SBRT to the enlarging right upper lobe lung mass under the care of Dr. [name].  INTERVAL HISTORY: Randy Andersen 65 y.o. male returns to the clinic today for follow-up visit.  The patient is feeling fine today with no concerning complaints he lost to 3 pounds since his last visit.  He denied having any current chest pain, shortness of breath, cough or hemoptysis.  He denied having any fever or chills.  He has no nausea, vomiting, diarrhea or constipation.  He denied having any headache or visual changes.  The patient continues to tolerate his treatment well with no concerning adverse  effects.  He is here today for evaluation before starting cycle #18.   MEDICAL HISTORY: Past Medical History:  Diagnosis Date   Asthma    as a child   Chronic pain disorder    LT leg and foot   Heart murmur    as a child   Hypertension    Malignant neoplasm of upper lobe of right lung ([facility])    Stroke ([facility])    mini stroke - 2015, some tingling in fingers on right     ALLERGIES:  has No Known Allergies.  MEDICATIONS:  Current Outpatient Medications  Medication Sig Dispense Refill   amLODipine (NORVASC) 10 MG tablet Take 10 mg by mouth daily.     Cholecalciferol 100 MCG (4000 UT) TABS Take 2 tablets by mouth daily.     doxylamine, Sleep, (UNISOM) 25 MG tablet Take 25 mg by mouth at bedtime as needed for sleep.     HYDROcodone-acetaminophen (NORCO) 10-325 MG tablet Take 1 tablet by mouth 2 (two) times daily.     oxybutynin (DITROPAN-XL) 10 MG 24 hr tablet Take by mouth.     phenazopyridine (PYRIDIUM) 200 MG tablet Take 1 tablet (200 mg total) by mouth 3 (three) times daily. 6 tablet 0   polyethylene glycol powder (GLYCOLAX/MIRALAX) 17 GM/SCOOP powder      prochlorperazine (COMPAZINE) 10 MG tablet Take 1 tablet (10 mg total) by mouth every 6 (  six) hours as needed. 30 tablet 2   rivaroxaban (XARELTO) 20 MG TABS tablet TAKE 1 TABLET BY MOUTH ONCE A DAY WITH SUPPER 30 tablet 2   senna-docusate (SENOKOT-S) 8.6-50 MG tablet Take 2 tablets by mouth daily.     tadalafil (CIALIS) 20 MG tablet Take by mouth.     tamsulosin (FLOMAX) 0.4 MG CAPS capsule Take 1 capsule (0.4 mg total) by mouth daily. 30 capsule 2   temazepam (RESTORIL) 15 MG capsule Take 1 capsule (15 mg total) by mouth at bedtime as needed for sleep. Pt is veteran - Call pt to set up delivery 30 capsule 0   No current facility-administered medications for this visit.    SURGICAL HISTORY:  Past Surgical History:  Procedure Laterality Date   BUNIONECTOMY Left    had hammer toe surgery also and callus removed   BUNIONECTOMY  Right    also had hammer toe surgery and callus removed   LEG SURGERY Left    PT reports he has pins placed in his Lt leg   ORIF TIBIA PLATEAU  10/09/2012   Dr Lorin Mercy   ORIF TIBIA PLATEAU Left 10/08/2012   Procedure: OPEN REDUCTION INTERNAL FIXATION (ORIF) TIBIAL PLATEAU;  Surgeon: Marybelle Killings, MD;  Location: [facility];  Service: Orthopedics;  Laterality: Left;   VIDEO BRONCHOSCOPY WITH ENDOBRONCHIAL NAVIGATION N/A 02/25/2020   Procedure: VIDEO BRONCHOSCOPY WITH ENDOBRONCHIAL NAVIGATION with biopsies;  Surgeon: Collene Gobble, MD;  Location: [facility];  Service: Thoracic;  Laterality: N/A;   VIDEO BRONCHOSCOPY WITH ENDOBRONCHIAL ULTRASOUND N/A 02/25/2020   Procedure: VIDEO BRONCHOSCOPY WITH ENDOBRONCHIAL ULTRASOUND;  Surgeon: Collene Gobble, MD;  Location[facility];  Service: Thoracic;  Laterality: N/A;    REVIEW OF SYSTEMS:  A comprehensive review of systems was negative.   PHYSICAL EXAMINATION: General appearance: alert, cooperative, and no distress Head: Normocephalic, without obvious abnormality, atraumatic Neck: no adenopathy, no JVD, supple, symmetrical, trachea midline, and thyroid not enlarged, symmetric, no tenderness/mass/nodules Lymph nodes: Cervical, supraclavicular, and axillary nodes normal. Resp: clear to auscultation bilaterally Back: symmetric, no curvature. ROM normal. No CVA tenderness. Cardio: regular rate and rhythm, S1, S2 normal, no murmur, click, rub or gallop GI: soft, non-tender; bowel sounds normal; no masses,  no organomegaly Extremities: extremities normal, atraumatic, no cyanosis or edema  ECOG PERFORMANCE STATUS: 1 - Symptomatic but completely ambulatory  Blood pressure (!) 134/92, pulse 75, temperature (!) 96.5 F (35.8 C), temperature source Tympanic, resp. rate 18, height 6\' 2"  (1.88 m), weight 179 lb (81.2 kg), SpO2 99 %.  LABORATORY DATA: Lab Results  Component Value Date   WBC 3.8 (L) 04/05/2021   HGB 13.8 04/05/2021   HCT 41.8 04/05/2021   MCV 85.1  04/05/2021   PLT 174 04/05/2021      Chemistry      Component Value Date/Time   NA 140 03/17/2021 0847   K 3.8 03/17/2021 0847   CL 108 03/17/2021 0847   CO2 25 03/17/2021 0847   BUN 19 03/17/2021 0847   CREATININE 1.65 (H) 03/17/2021 0847      Component Value Date/Time   CALCIUM 10.0 03/17/2021 0847   ALKPHOS 115 03/17/2021 0847   AST 14 (L) 03/17/2021 0847   ALT 13 03/17/2021 0847   BILITOT 0.3 03/17/2021 0847       RADIOGRAPHIC STUDIES: No results found.   ASSESSMENT AND PLAN: This is a very pleasant 65 years old African-American male diagnosed with stage IV non-small cell lung cancer, adenocarcinoma presented with right  upper lobe nodule in addition to right hilar, infrahilar, subcarinal and right paratracheal adenopathy in addition to subcutaneous nodules in the left axilla and 2 small metastatic brain lesion diagnosed in August 2021.  The patient has no actionable mutations. He is currently undergoing systemic chemotherapy with carboplatin for AUC of 5, Alimta 500 mg/M2 and Keytruda 200 mg IV every 3 weeks.  Status post 17 cycles.  Starting from cycle #5 he will be on maintenance treatment with Alimta and Keytruda every 3 weeks.  I reduce the dose of his Alimta to 400 mg/M2 secondary to the renal insufficiency. The patient is currently on treatment with single agent Keytruda and Alimta was discontinued secondary to worsening renal insufficiency. He also underwent SBRT to the enlarging right upper lobe lung mass under the care of Dr. [name]. The patient continues to tolerate this treatment well with no concerning adverse effects. I recommended for him to proceed with cycle #18 today as planned. I will see him back for follow-up visit in 3 weeks for evaluation before starting cycle #20 with repeat CT scan of the chest, abdomen pelvis for restaging of his disease. The patient was advised to call immediately if he has any other concerning symptoms in the interval. The patient  voices understanding of current disease status and treatment options and is in agreement with the current care plan.  All questions were answered. The patient knows to call the clinic with any problems, questions or concerns. We can certainly see the patient much sooner if necessary.   Disclaimer: This note was dictated with voice recognition software. Similar sounding words can inadvertently be transcribed and may not be corrected upon review.

## 2021-04-05 NOTE — Patient Instructions (Signed)
[facility]  Discharge Instructions: Thank you for choosing [facility] to provide your oncology and hematology care.   If you have a lab appointment with the [facility], please go directly to the [facility] and check in at the registration area.   Wear comfortable clothing and clothing appropriate for easy access to any Portacath or PICC line.   We strive to give you quality time with your provider. You may need to reschedule your appointment if you arrive late (15 or more minutes).  Arriving late affects you and other patients whose appointments are after yours.  Also, if you miss three or more appointments without notifying the office, you may be dismissed from the clinic at the provider's discretion.      For prescription refill requests, have your pharmacy contact our office and allow 72 hours for refills to be completed.    Today you received the following chemotherapy and/or immunotherapy agents Randy Andersen      To help prevent nausea and vomiting after your treatment, we encourage you to take your nausea medication as directed.  BELOW ARE SYMPTOMS THAT SHOULD BE REPORTED IMMEDIATELY: *FEVER GREATER THAN 100.4 F (38 C) OR HIGHER *CHILLS OR SWEATING *NAUSEA AND VOMITING THAT IS NOT CONTROLLED WITH YOUR NAUSEA MEDICATION *UNUSUAL SHORTNESS OF BREATH *UNUSUAL BRUISING OR BLEEDING *URINARY PROBLEMS (pain or burning when urinating, or frequent urination) *BOWEL PROBLEMS (unusual diarrhea, constipation, pain near the anus) TENDERNESS IN MOUTH AND THROAT WITH OR WITHOUT PRESENCE OF ULCERS (sore throat, sores in mouth, or a toothache) UNUSUAL RASH, SWELLING OR PAIN  UNUSUAL VAGINAL DISCHARGE OR ITCHING   Items with * indicate a potential emergency and should be followed up as soon as possible or go to the Emergency Department if any problems should occur.  Please show the CHEMOTHERAPY ALERT CARD or IMMUNOTHERAPY ALERT CARD at check-in to  the Emergency Department and triage nurse.  Should you have questions after your visit or need to cancel or reschedule your appointment, please contact [facility]  Dept: [phone]  and follow the prompts.  Office hours are 8:00 a.m. to 4:30 p.m. Monday - Friday. Please note that voicemails left after 4:00 p.m. may not be returned until the following business day.  We are closed weekends and major holidays. You have access to a nurse at all times for urgent questions. Please call the main number to the clinic Dept: [phone] and follow the prompts.   For any non-urgent questions, you may also contact your provider using MyChart. We now offer e-Visits for anyone 102 and older to request care online for non-urgent symptoms. For details visit [email or web address].   Also download the MyChart app! Go to the app store, search "MyChart", open the app, select [facility], and log in with your MyChart username and password.  Due to Covid, a mask is required upon entering the hospital/clinic. If you do not have a mask, one will be given to you upon arrival. For doctor visits, patients may have 1 support person aged 18 or older with them. For treatment visits, patients cannot have anyone with them due to current Covid guidelines and our immunocompromised population.

## 2021-04-06 ENCOUNTER — Other Ambulatory Visit: Payer: No Typology Code available for payment source

## 2021-04-06 ENCOUNTER — Ambulatory Visit: Payer: No Typology Code available for payment source

## 2021-04-06 ENCOUNTER — Ambulatory Visit: Payer: No Typology Code available for payment source | Admitting: Internal Medicine

## 2021-04-06 ENCOUNTER — Ambulatory Visit
Admission: RE | Admit: 2021-04-06 | Discharge: 2021-04-06 | Disposition: A | Discharge status: Home / Self Care | Payer: Medicare PPO | Source: Ambulatory Visit | Attending: Radiation Oncology | Admitting: Radiation Oncology

## 2021-04-06 ENCOUNTER — Other Ambulatory Visit: Payer: Self-pay

## 2021-04-06 ENCOUNTER — Encounter: Payer: Self-pay | Admitting: Radiation Oncology

## 2021-04-06 DIAGNOSIS — Z510 Encounter for antineoplastic radiation therapy: Secondary | ICD-10-CM | POA: Diagnosis not present

## 2021-04-07 ENCOUNTER — Telehealth: Payer: Self-pay | Admitting: Internal Medicine

## 2021-04-07 NOTE — Telephone Encounter (Signed)
Scheduled appt per 9/13 los - patient to get an updated schedule next visit.

## 2021-04-11 ENCOUNTER — Encounter: Payer: Self-pay | Admitting: Internal Medicine

## 2021-04-11 NOTE — Progress Notes (Signed)
                                                                                                                                                             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  Intent: Curative  Radiation Treatment Dates: 03/23/2021 through 04/06/2021 Site Technique Total Dose (Gy) Dose per Fx (Gy) Completed Fx Beam Energies  Lung, Right: Lung_Rt IMRT 60/60 7.5 8/8 6XFFF   Narrative: The patient tolerated radiation therapy relatively well.   Plan: The patient will receive a call in about one month from the radiation oncology department. He will continue follow up with Dr. Julien Nordmann as well.   ________________________________________________    Carola Rhine, [facility]

## 2021-04-26 ENCOUNTER — Ambulatory Visit (HOSPITAL_COMMUNITY)
Admission: RE | Admit: 2021-04-26 | Discharge: 2021-04-26 | Disposition: A | Discharge status: Home / Self Care | Payer: Medicare PPO | Source: Ambulatory Visit | Attending: Internal Medicine | Admitting: Internal Medicine

## 2021-04-26 ENCOUNTER — Other Ambulatory Visit: Payer: No Typology Code available for payment source

## 2021-04-26 ENCOUNTER — Other Ambulatory Visit: Payer: Self-pay

## 2021-04-26 ENCOUNTER — Ambulatory Visit: Payer: No Typology Code available for payment source

## 2021-04-26 ENCOUNTER — Ambulatory Visit: Payer: No Typology Code available for payment source | Admitting: Internal Medicine

## 2021-04-26 DIAGNOSIS — C3490 Malignant neoplasm of unspecified part of unspecified bronchus or lung: Secondary | ICD-10-CM | POA: Diagnosis not present

## 2021-04-26 IMAGING — CT CT CHEST W/ CM
2 of 5 series · 12 of 36 positions shown, 15 images · IV contrast (OMNIPAQUE)
Comparison: CT the chest, abdomen and pelvis [DATE].

CLINICAL DATA: 65-year-old male with history of non-small cell lung
cancer. Follow-up evaluation.

EXAM:
CT CHEST, ABDOMEN, AND PELVIS WITH CONTRAST
TECHNIQUE: Multidetector CT imaging of the chest, abdomen and pelvis was
performed following the standard protocol during bolus
administration of intravenous contrast.
CONTRAST:  80mL OMNIPAQUE IOHEXOL 350 MG/ML SOLN

[Series 2: cap with · axial · 0.76mm/px · z∈[+1226,+1746]mm · 9 of 130 slices shown, 12 images]
[im 13/130  mediastinal]
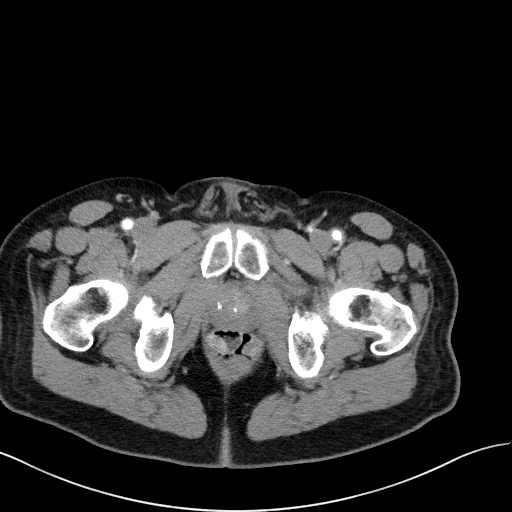
[im 13/130  lung]
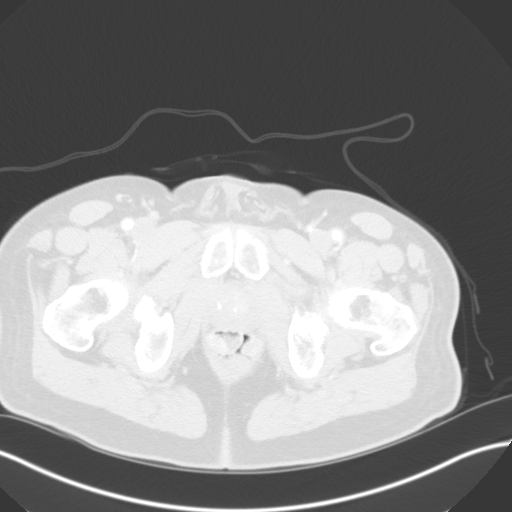
[im 26/130  lung]
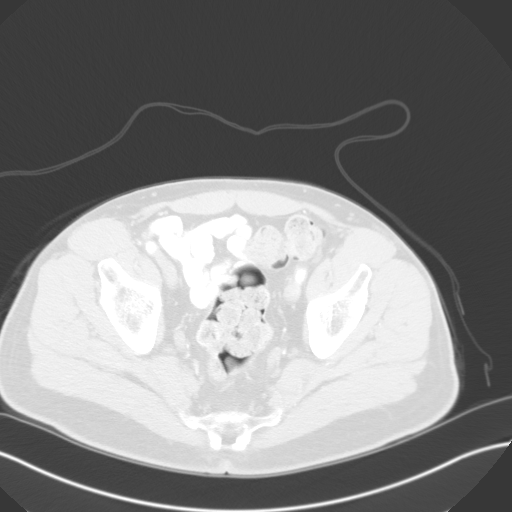
[im 39/130  lung]
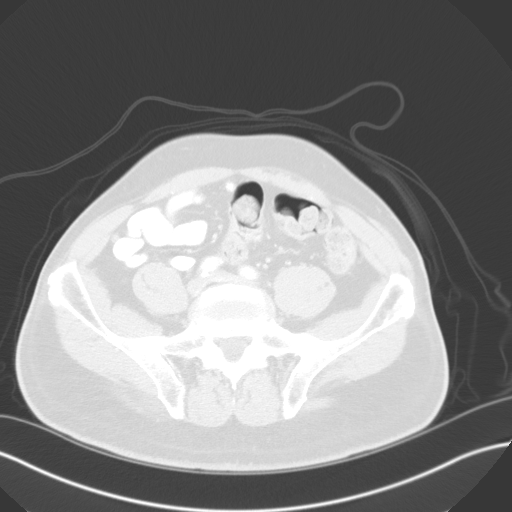
[im 52/130  lung]
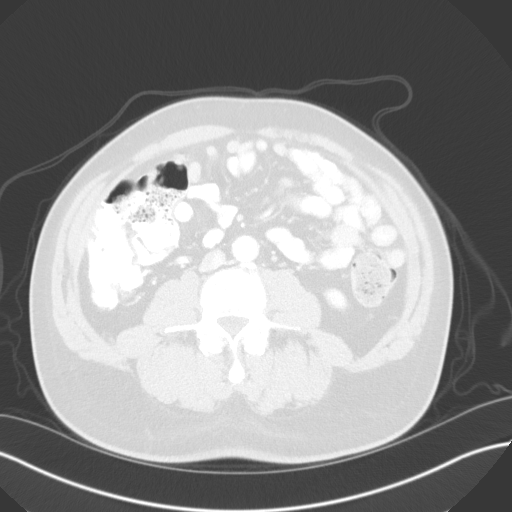
[im 65/130  mediastinal]
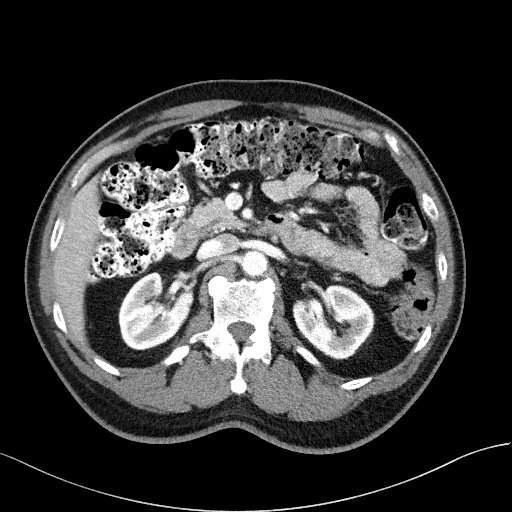
[im 65/130  lung]
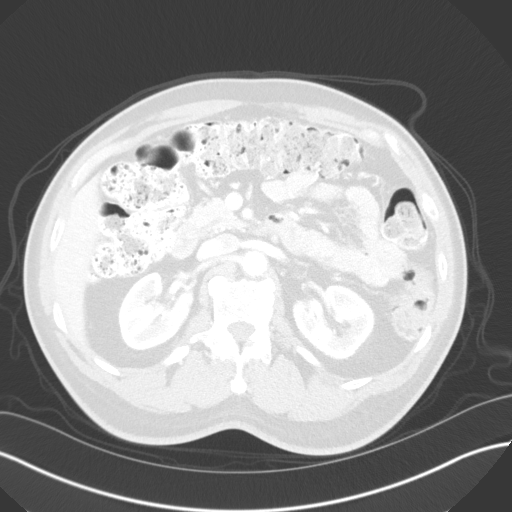
[im 78/130  lung]
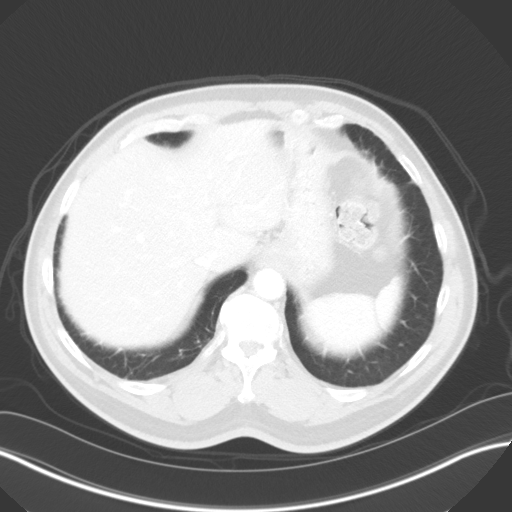
[im 91/130  lung]
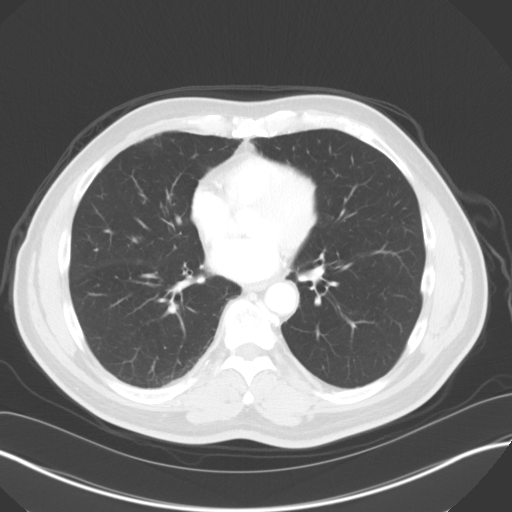
[im 104/130  lung]
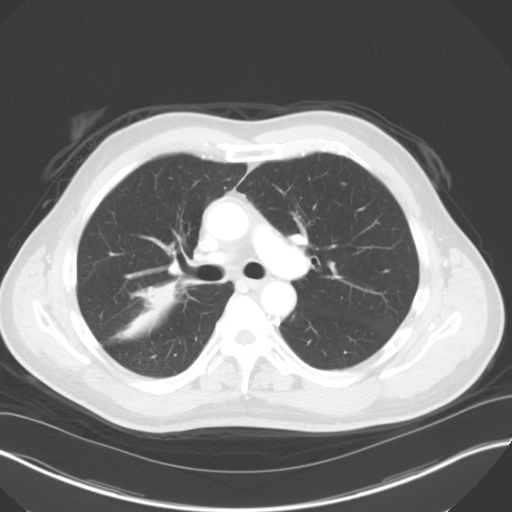
[im 117/130  mediastinal]
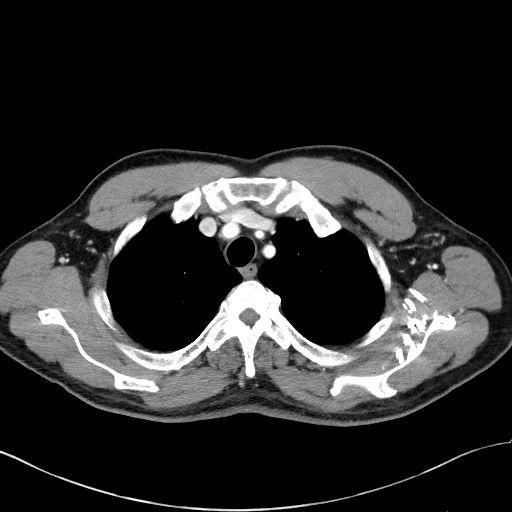
[im 117/130  lung]
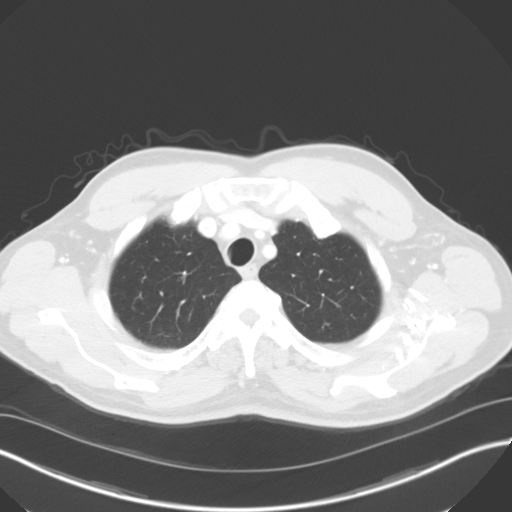

[Series 5: coronals · coronal · 0.94mm/px · 3 of 128 slices shown]
[im 26/128  lung]
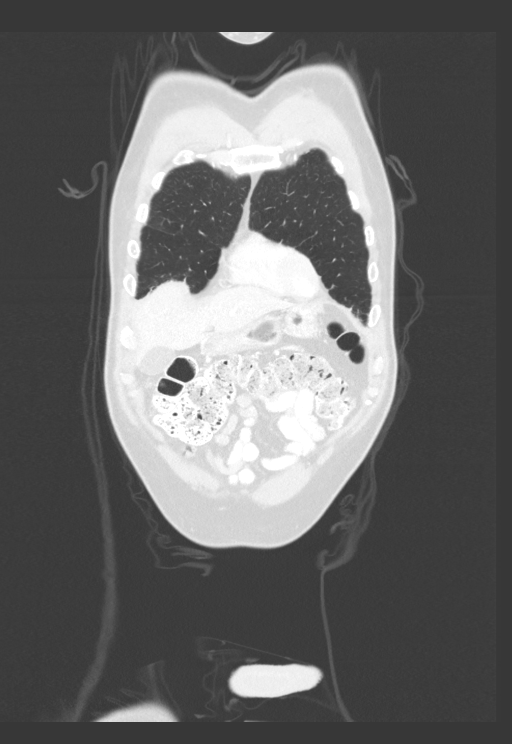
[im 51/128  lung]
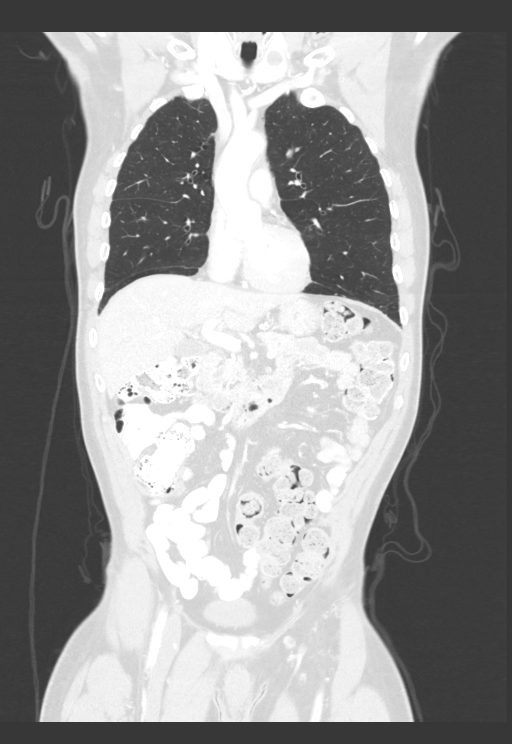
[im 77/128  lung]
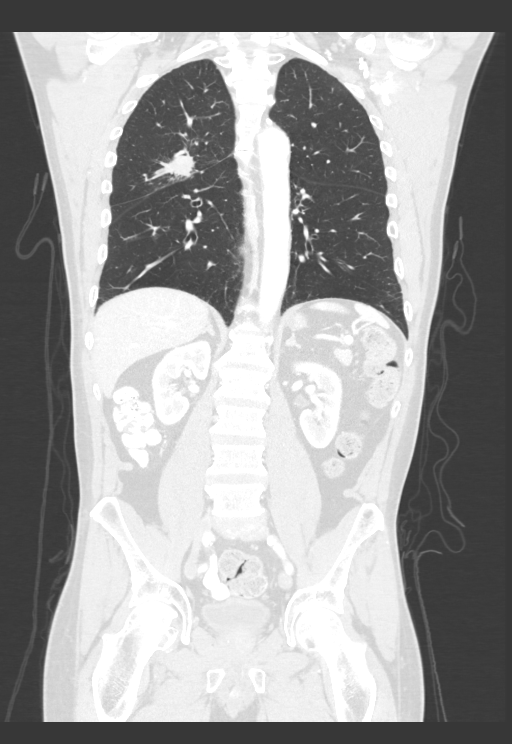

[12 of 36 positions shown; findings below may reference images not displayed]

FINDINGS: CT CHEST FINDINGS

Cardiovascular: Heart size is normal. There is no significant
pericardial fluid, thickening or pericardial calcification. There is
aortic atherosclerosis, as well as atherosclerosis of the great
vessels of the mediastinum and the coronary arteries, including
calcified atherosclerotic plaque in the left anterior descending and
left circumflex coronary arteries.

Mediastinum/Nodes: Mediastinal or hilar no pathologically enlarged
lymph nodes. Esophagus is unremarkable in appearance. No axillary
lymphadenopathy.

Lungs/Pleura: Previously noted right upper lobe mass appears
slightly less bulky than the prior examination, currently measuring
5.2 x 2.3 cm (previously 4.7 x 3.2 cm). This mass again has
macrolobulated and spiculated margins, with extension to the pleura
posteriorly where it is in contact with the right major fissure. The
fissure appears thickened in this region, but there is no definitive
extension across the fissure at this time. 5 mm left upper lobe
ground-glass attenuation nodule (axial image 51 of series 4),
stable. No other suspicious appearing pulmonary nodules or masses
are noted. No acute consolidative airspace disease. No pleural
effusions. Mild diffuse bronchial wall thickening with mild
centrilobular and paraseptal emphysema.

Musculoskeletal: There are no aggressive appearing lytic or blastic
lesions noted in the visualized portions of the skeleton.

CT ABDOMEN PELVIS FINDINGS

Hepatobiliary: No suspicious cystic or solid hepatic lesions. No
intra or extrahepatic biliary ductal dilatation. Gallbladder is
normal in appearance.

Pancreas: No pancreatic mass. No pancreatic ductal dilatation. No
pancreatic or peripancreatic fluid collections or inflammatory
changes.

Spleen: Unremarkable.

Adrenals/Urinary Tract: Subcentimeter low-attenuation lesion in the
upper pole of the right kidney, too small to characterize, but
similar to the prior study and statistically likely to represent a
cyst. Left kidney and bilateral adrenal glands are normal in
appearance. No hydroureteronephrosis. Urinary bladder is normal in
appearance.

Stomach/Bowel: The appearance of the stomach is normal. There is no
pathologic dilatation of small bowel or colon. Normal appendix.

Vascular/Lymphatic: Aortic atherosclerosis, without evidence of
aneurysm or dissection in the abdominal or pelvic vasculature. No
lymphadenopathy noted in the abdomen or pelvis.

Reproductive: Prostate gland and seminal vesicles are unremarkable
in appearance.

Other: No significant volume of ascites.  No pneumoperitoneum.

Musculoskeletal: There are no aggressive appearing lytic or blastic
lesions noted in the visualized portions of the skeleton.
IMPRESSION: 1. Previously noted right upper lobe mass appears slightly less
bulky than the prior study, suggesting some positive interval
response to therapy.
2. No thoracic lymphadenopathy or other signs of metastatic disease
in the abdomen or pelvis on today's examination.
3. Mild diffuse bronchial wall thickening with mild centrilobular
and paraseptal emphysema; imaging findings suggestive of underlying
COPD.
4. Aortic atherosclerosis, in addition to 2 vessel coronary artery
disease. Please note that although the presence of coronary artery
calcium documents the presence of coronary artery disease, the
severity of this disease and any potential stenosis cannot be
assessed on this non-gated CT examination. Assessment for potential
risk factor modification, dietary therapy or pharmacologic therapy
may be warranted, if clinically indicated.
5. Additional incidental findings, as above.

Aortic Atherosclerosis ([46]-[46]) and Emphysema ([46]-[46]).

## 2021-04-26 MED ORDER — IOHEXOL 350 MG/ML SOLN
80.0000 mL | Freq: Once | INTRAVENOUS | Status: AC | PRN
  Administered 2021-04-26: 80 mL via INTRAVENOUS

## 2021-04-27 ENCOUNTER — Ambulatory Visit: Payer: No Typology Code available for payment source

## 2021-04-27 ENCOUNTER — Ambulatory Visit: Payer: No Typology Code available for payment source | Admitting: Internal Medicine

## 2021-04-27 ENCOUNTER — Other Ambulatory Visit: Payer: No Typology Code available for payment source

## 2021-04-28 ENCOUNTER — Encounter: Payer: Self-pay | Admitting: Internal Medicine

## 2021-04-28 ENCOUNTER — Inpatient Hospital Stay: Payer: No Typology Code available for payment source | Attending: Internal Medicine

## 2021-04-28 ENCOUNTER — Other Ambulatory Visit: Payer: Self-pay

## 2021-04-28 ENCOUNTER — Inpatient Hospital Stay (HOSPITAL_BASED_OUTPATIENT_CLINIC_OR_DEPARTMENT_OTHER): Payer: No Typology Code available for payment source | Admitting: Internal Medicine

## 2021-04-28 ENCOUNTER — Inpatient Hospital Stay: Payer: No Typology Code available for payment source

## 2021-04-28 VITALS — BP 136/92 | HR 84 | Temp 97.3°F | Resp 19 | Ht 74.0 in | Wt 185.7 lb

## 2021-04-28 DIAGNOSIS — C7931 Secondary malignant neoplasm of brain: Secondary | ICD-10-CM | POA: Insufficient documentation

## 2021-04-28 DIAGNOSIS — Z79899 Other long term (current) drug therapy: Secondary | ICD-10-CM | POA: Diagnosis not present

## 2021-04-28 DIAGNOSIS — C3411 Malignant neoplasm of upper lobe, right bronchus or lung: Secondary | ICD-10-CM

## 2021-04-28 DIAGNOSIS — Z5112 Encounter for antineoplastic immunotherapy: Secondary | ICD-10-CM | POA: Insufficient documentation

## 2021-04-28 DIAGNOSIS — N289 Disorder of kidney and ureter, unspecified: Secondary | ICD-10-CM | POA: Diagnosis not present

## 2021-04-28 LAB — CBC WITH DIFFERENTIAL (CANCER CENTER ONLY)
Abs Immature Granulocytes: 0.01 10*3/uL (ref 0.00–0.07)
Basophils Absolute: 0 10*3/uL (ref 0.0–0.1)
Basophils Relative: 1 %
Eosinophils Absolute: 0.1 10*3/uL (ref 0.0–0.5)
Eosinophils Relative: 3 %
HCT: 41.2 % (ref 39.0–52.0)
Hemoglobin: 13.6 g/dL (ref 13.0–17.0)
Immature Granulocytes: 0 %
Lymphocytes Relative: 24 %
Lymphs Abs: 0.9 10*3/uL (ref 0.7–4.0)
MCH: 29 pg (ref 26.0–34.0)
MCHC: 33 g/dL (ref 30.0–36.0)
MCV: 87.8 fL (ref 80.0–100.0)
Monocytes Absolute: 0.5 10*3/uL (ref 0.1–1.0)
Monocytes Relative: 13 %
Neutro Abs: 2.3 10*3/uL (ref 1.7–7.7)
Neutrophils Relative %: 59 %
Platelet Count: 131 10*3/uL — ABNORMAL LOW (ref 150–400)
RBC: 4.69 MIL/uL (ref 4.22–5.81)
RDW: 14.7 % (ref 11.5–15.5)
WBC Count: 3.9 10*3/uL — ABNORMAL LOW (ref 4.0–10.5)
nRBC: 0 % (ref 0.0–0.2)

## 2021-04-28 LAB — CMP (CANCER CENTER ONLY)
ALT: 24 U/L (ref 0–44)
AST: 20 U/L (ref 15–41)
Albumin: 3.7 g/dL (ref 3.5–5.0)
Alkaline Phosphatase: 103 U/L (ref 38–126)
Anion gap: 8 (ref 5–15)
BUN: 21 mg/dL (ref 8–23)
CO2: 26 mmol/L (ref 22–32)
Calcium: 9.7 mg/dL (ref 8.9–10.3)
Chloride: 107 mmol/L (ref 98–111)
Creatinine: 1.62 mg/dL — ABNORMAL HIGH (ref 0.61–1.24)
GFR, Estimated: 47 mL/min — ABNORMAL LOW (ref >60)
Glucose, Bld: 104 mg/dL — ABNORMAL HIGH (ref 70–99)
Potassium: 4.2 mmol/L (ref 3.5–5.1)
Sodium: 141 mmol/L (ref 135–145)
Total Bilirubin: 0.4 mg/dL (ref 0.3–1.2)
Total Protein: 7.4 g/dL (ref 6.5–8.1)

## 2021-04-28 LAB — TSH: TSH: 1.492 u[IU]/mL (ref 0.320–4.118)

## 2021-04-28 MED ORDER — SODIUM CHLORIDE 0.9 % IV SOLN: Freq: Once | INTRAVENOUS | Status: AC

## 2021-04-28 MED ORDER — SODIUM CHLORIDE 0.9 % IV SOLN
200.0000 mg | Freq: Once | INTRAVENOUS | Status: AC
  Administered 2021-04-28: 200 mg via INTRAVENOUS
  Filled 2021-04-28: qty 8

## 2021-04-28 NOTE — Patient Instructions (Signed)
[facility]  Discharge Instructions: ?Thank you for choosing [facility] to provide your oncology and hematology care.  ? ?If you have a lab appointment with the Cancer Center, please go directly to the Cancer Center and check in at the registration area. ?  ?Wear comfortable clothing and clothing appropriate for easy access to any Portacath or PICC line.  ? ?We strive to give you quality time with your provider. You may need to reschedule your appointment if you arrive late (15 or more minutes).  Arriving late affects you and other patients whose appointments are after yours.  Also, if you miss three or more appointments without notifying the office, you may be dismissed from the clinic at the provider?s discretion.    ?  ?For prescription refill requests, have your pharmacy contact our office and allow 72 hours for refills to be completed.   ? ?Today you received the following chemotherapy and/or immunotherapy agents: Keytruda ?  ?To help prevent nausea and vomiting after your treatment, we encourage you to take your nausea medication as directed. ? ?BELOW ARE SYMPTOMS THAT SHOULD BE REPORTED IMMEDIATELY: ?*FEVER GREATER THAN 100.4 F (38 ?C) OR HIGHER ?*CHILLS OR SWEATING ?*NAUSEA AND VOMITING THAT IS NOT CONTROLLED WITH YOUR NAUSEA MEDICATION ?*UNUSUAL SHORTNESS OF BREATH ?*UNUSUAL BRUISING OR BLEEDING ?*URINARY PROBLEMS (pain or burning when urinating, or frequent urination) ?*BOWEL PROBLEMS (unusual diarrhea, constipation, pain near the anus) ?TENDERNESS IN MOUTH AND THROAT WITH OR WITHOUT PRESENCE OF ULCERS (sore throat, sores in mouth, or a toothache) ?UNUSUAL RASH, SWELLING OR PAIN  ?UNUSUAL VAGINAL DISCHARGE OR ITCHING  ? ?Items with * indicate a potential emergency and should be followed up as soon as possible or go to the Emergency Department if any problems should occur. ? ?Please show the CHEMOTHERAPY ALERT CARD or IMMUNOTHERAPY ALERT CARD at check-in to the  Emergency Department and triage nurse. ? ?Should you have questions after your visit or need to cancel or reschedule your appointment, please contact [facility]  Dept: [phone]  and follow the prompts.  Office hours are 8:00 a.m. to 4:30 p.m. Monday - Friday. Please note that voicemails left after 4:00 p.m. may not be returned until the following business day.  We are closed weekends and major holidays. You have access to a nurse at all times for urgent questions. Please call the main number to the clinic Dept: [phone] and follow the prompts. ? ? ?For any non-urgent questions, you may also contact your provider using MyChart. We now offer e-Visits for anyone 18 and older to request care online for non-urgent symptoms. For details visit [email or web address]. ?  ?Also download the MyChart app! Go to the app store, search "MyChart", open the app, select , and log in with your MyChart username and password. ? ?Due to Covid, a mask is required upon entering the hospital/clinic. If you do not have a mask, one will be given to you upon arrival. For doctor visits, patients may have 1 support person aged 18 or older with them. For treatment visits, patients cannot have anyone with them due to current Covid guidelines and our immunocompromised population.  ? ?

## 2021-04-28 NOTE — Progress Notes (Signed)
[facility] [phone]   Fax:(336) [facility] [address] [place] [address]  DIAGNOSIS: Stage IV non-small cell lung cancer, favored to be adenocarcinoma.  He presented with posterior right upper lobe nodule as well as right hilar, infrahilar, subcarinal, and right paratracheal lymphadenopathy.  He also has a 1.4 cm x 1 cm soft tissue nodule in the skin/subcutaneous fat in the left axilla.  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  He was diagnosed in August 2021   Molecular Biomarkers: BIOMARKER(S)         % CFDNA OR AMPLIFICATION       ASSOCIATED FDA-APPROVED THERAPIES        CLINICAL TRIAL AVAILABILITY [id number] 2.9% None    Yes [id number] 0.4% None    Yes [id number] 0.2% None    Yes   PRIOR THERAPY:  1) SRS to the small metastatic brain lesion under the care of Dr. [name] on 03/18/20   CURRENT THERAPY:  1) Palliative systemic chemotherapy with carboplatin for an AUC of 5, Alimta 500 mg/m2, and Keytruda 200 mg IV every 3 weeks. First dose expected on 04/07/20.  Status post 18 cycles.  Starting from cycle #5 he will be on maintenance treatment with Alimta and Keytruda every 3 weeks.  For the last few cycles he is currently on maintenance treatment with single agent Keytruda. 2) SBRT to the enlarging right upper lobe lung mass under the care of Dr. [name].  INTERVAL HISTORY: Randy Andersen 65 y.o. male returns to the clinic today for follow-up visit.  The patient is feeling fine today with no concerning complaints.  He gained few pounds since his last visit.  He denied having any current chest pain, shortness of breath, cough or hemoptysis.  He denied having any fever or chills.  He has no nausea, vomiting, diarrhea or constipation.  He has no headache or visual changes.  He has no weight loss or night sweats.  He continues to tolerate his maintenance treatment  with Keytruda fairly well.  The patient had repeat CT scan of the chest, abdomen pelvis performed recently and he is here for evaluation and discussion of his discuss results.   MEDICAL HISTORY: Past Medical History:  Diagnosis Date   Asthma    as a child   Chronic pain disorder    LT leg and foot   Heart murmur    as a child   Hypertension    Malignant neoplasm of upper lobe of right lung ([facility])    Stroke ([facility])    mini stroke - 2015, some tingling in fingers on right     ALLERGIES:  has No Known Allergies.  MEDICATIONS:  Current Outpatient Medications  Medication Sig Dispense Refill   amLODipine (NORVASC) 10 MG tablet Take 10 mg by mouth daily.     Cholecalciferol 100 MCG (4000 UT) TABS Take 2 tablets by mouth daily.     doxylamine, Sleep, (UNISOM) 25 MG tablet Take 25 mg by mouth at bedtime as needed for sleep.     HYDROcodone-acetaminophen (NORCO) 10-325 MG tablet Take 1 tablet by mouth 2 (two) times daily.     oxybutynin (DITROPAN-XL) 10 MG 24 hr tablet Take by mouth.     phenazopyridine (PYRIDIUM) 200 MG tablet Take 1 tablet (200 mg total) by mouth 3 (three) times daily. 6 tablet 0   polyethylene glycol powder (GLYCOLAX/MIRALAX) 17 GM/SCOOP  powder      prochlorperazine (COMPAZINE) 10 MG tablet Take 1 tablet (10 mg total) by mouth every 6 (six) hours as needed. 30 tablet 2   rivaroxaban (XARELTO) 20 MG TABS tablet TAKE 1 TABLET BY MOUTH ONCE A DAY WITH SUPPER 30 tablet 2   senna-docusate (SENOKOT-S) 8.6-50 MG tablet Take 2 tablets by mouth daily.     tadalafil (CIALIS) 20 MG tablet Take by mouth.     tamsulosin (FLOMAX) 0.4 MG CAPS capsule Take 1 capsule (0.4 mg total) by mouth daily. 30 capsule 2   temazepam (RESTORIL) 15 MG capsule Take 1 capsule (15 mg total) by mouth at bedtime as needed for sleep. Pt is veteran - Call pt to set up delivery 30 capsule 0   No current facility-administered medications for this visit.    SURGICAL HISTORY:  Past Surgical History:   Procedure Laterality Date   BUNIONECTOMY Left    had hammer toe surgery also and callus removed   BUNIONECTOMY Right    also had hammer toe surgery and callus removed   LEG SURGERY Left    PT reports he has pins placed in his Lt leg   ORIF TIBIA PLATEAU  10/09/2012   Dr Lorin Mercy   ORIF TIBIA PLATEAU Left 10/08/2012   Procedure: OPEN REDUCTION INTERNAL FIXATION (ORIF) TIBIAL PLATEAU;  Surgeon: Marybelle Killings, MD;  Location: [facility];  Service: Orthopedics;  Laterality: Left;   VIDEO BRONCHOSCOPY WITH ENDOBRONCHIAL NAVIGATION N/A 02/25/2020   Procedure: VIDEO BRONCHOSCOPY WITH ENDOBRONCHIAL NAVIGATION with biopsies;  Surgeon: Collene Gobble, MD;  Location: [facility] OR;  Service: Thoracic;  Laterality: N/A;   VIDEO BRONCHOSCOPY WITH ENDOBRONCHIAL ULTRASOUND N/A 02/25/2020   Procedure: VIDEO BRONCHOSCOPY WITH ENDOBRONCHIAL ULTRASOUND;  Surgeon: Collene Gobble, MD;  Location: [facility] OR;  Service: Thoracic;  Laterality: N/A;    REVIEW OF SYSTEMS:  Constitutional: negative Eyes: negative Ears, nose, mouth, throat, and face: negative Respiratory: negative Cardiovascular: negative Gastrointestinal: negative Genitourinary:negative Integument/breast: negative Hematologic/lymphatic: negative Musculoskeletal:negative Neurological: negative Behavioral/Psych: negative Endocrine: negative Allergic/Immunologic: negative   PHYSICAL EXAMINATION: General appearance: alert, cooperative, and no distress Head: Normocephalic, without obvious abnormality, atraumatic Neck: no adenopathy, no JVD, supple, symmetrical, trachea midline, and thyroid not enlarged, symmetric, no tenderness/mass/nodules Lymph nodes: Cervical, supraclavicular, and axillary nodes normal. Resp: clear to auscultation bilaterally Back: symmetric, no curvature. ROM normal. No CVA tenderness. Cardio: regular rate and rhythm, S1, S2 normal, no murmur, click, rub or gallop GI: soft, non-tender; bowel sounds normal; no masses,  no organomegaly Extremities:  extremities normal, atraumatic, no cyanosis or edema Neurologic: Alert and oriented X 3, normal strength and tone. Normal symmetric reflexes. Normal coordination and gait  ECOG PERFORMANCE STATUS: 1 - Symptomatic but completely ambulatory  Blood pressure (!) 136/92, pulse 84, temperature (!) 97.3 F (36.3 C), temperature source Tympanic, resp. rate 19, height 6\' 2"  (1.88 m), weight 185 lb 11.2 oz (84.2 kg), SpO2 99 %.  LABORATORY DATA: Lab Results  Component Value Date   WBC 3.8 (L) 04/05/2021   HGB 13.8 04/05/2021   HCT 41.8 04/05/2021   MCV 85.1 04/05/2021   PLT 174 04/05/2021      Chemistry      Component Value Date/Time   NA 140 04/05/2021 1335   K 4.2 04/05/2021 1335   CL 108 04/05/2021 1335   CO2 23 04/05/2021 1335   BUN 14 04/05/2021 1335   CREATININE 1.55 (H) 04/05/2021 1335      Component Value Date/Time   CALCIUM 9.8 04/05/2021  1335   ALKPHOS 112 04/05/2021 1335   AST 19 04/05/2021 1335   ALT 22 04/05/2021 1335   BILITOT 0.3 04/05/2021 1335       RADIOGRAPHIC STUDIES: CT Chest W Contrast  Result Date: 04/28/2021 CLINICAL DATA:  64 year old male with history of non-small cell lung cancer. Follow-up evaluation. EXAM: CT CHEST, ABDOMEN, AND PELVIS WITH CONTRAST TECHNIQUE: Multidetector CT imaging of the chest, abdomen and pelvis was performed following the standard protocol during bolus administration of intravenous contrast. CONTRAST:  49mL OMNIPAQUE IOHEXOL 350 MG/ML SOLN COMPARISON:  CT the chest, abdomen and pelvis 02/07/2021. FINDINGS: CT CHEST FINDINGS Cardiovascular: Heart size is normal. There is no significant pericardial fluid, thickening or pericardial calcification. There is aortic atherosclerosis, as well as atherosclerosis of the great vessels of the mediastinum and the coronary arteries, including calcified atherosclerotic plaque in the left anterior descending and left circumflex coronary arteries. Mediastinum/Nodes: Mediastinal or hilar no  pathologically enlarged lymph nodes. Esophagus is unremarkable in appearance. No axillary lymphadenopathy. Lungs/Pleura: Previously noted right upper lobe mass appears slightly less bulky than the prior examination, currently measuring 5.2 x 2.3 cm (previously 4.7 x 3.2 cm). This mass again has macrolobulated and spiculated margins, with extension to the pleura posteriorly where it is in contact with the right major fissure. The fissure appears thickened in this region, but there is no definitive extension across the fissure at this time. 5 mm left upper lobe ground-glass attenuation nodule (axial image 51 of series 4), stable. No other suspicious appearing pulmonary nodules or masses are noted. No acute consolidative airspace disease. No pleural effusions. Mild diffuse bronchial wall thickening with mild centrilobular and paraseptal emphysema. Musculoskeletal: There are no aggressive appearing lytic or blastic lesions noted in the visualized portions of the skeleton. CT ABDOMEN PELVIS FINDINGS Hepatobiliary: No suspicious cystic or solid hepatic lesions. No intra or extrahepatic biliary ductal dilatation. Gallbladder is normal in appearance. Pancreas: No pancreatic mass. No pancreatic ductal dilatation. No pancreatic or peripancreatic fluid collections or inflammatory changes. Spleen: Unremarkable. Adrenals/Urinary Tract: Subcentimeter low-attenuation lesion in the upper pole of the right kidney, too small to characterize, but similar to the prior study and statistically likely to represent a cyst. Left kidney and bilateral adrenal glands are normal in appearance. No hydroureteronephrosis. Urinary bladder is normal in appearance. Stomach/Bowel: The appearance of the stomach is normal. There is no pathologic dilatation of small bowel or colon. Normal appendix. Vascular/Lymphatic: Aortic atherosclerosis, without evidence of aneurysm or dissection in the abdominal or pelvic vasculature. No lymphadenopathy noted in the  abdomen or pelvis. Reproductive: Prostate gland and seminal vesicles are unremarkable in appearance. Other: No significant volume of ascites.  No pneumoperitoneum. Musculoskeletal: There are no aggressive appearing lytic or blastic lesions noted in the visualized portions of the skeleton. IMPRESSION: 1. Previously noted right upper lobe mass appears slightly less bulky than the prior study, suggesting some positive interval response to therapy. 2. No thoracic lymphadenopathy or other signs of metastatic disease in the abdomen or pelvis on today's examination. 3. Mild diffuse bronchial wall thickening with mild centrilobular and paraseptal emphysema; imaging findings suggestive of underlying COPD. 4. Aortic atherosclerosis, in addition to 2 vessel coronary artery disease. Please note that although the presence of coronary artery calcium documents the presence of coronary artery disease, the severity of this disease and any potential stenosis cannot be assessed on this non-gated CT examination. Assessment for potential risk factor modification, dietary therapy or pharmacologic therapy may be warranted, if clinically indicated. 5. Additional incidental findings,  as above. Aortic Atherosclerosis (ICD10-I70.0) and Emphysema (ICD10-J43.9). Electronically Signed   By: Vinnie Langton M.D.   On: 04/28/2021 10:27   CT Abdomen Pelvis W Contrast  Result Date: 04/28/2021 CLINICAL DATA:  65 year old male with history of non-small cell lung cancer. Follow-up evaluation. EXAM: CT CHEST, ABDOMEN, AND PELVIS WITH CONTRAST TECHNIQUE: Multidetector CT imaging of the chest, abdomen and pelvis was performed following the standard protocol during bolus administration of intravenous contrast. CONTRAST:  68mL OMNIPAQUE IOHEXOL 350 MG/ML SOLN COMPARISON:  CT the chest, abdomen and pelvis 02/07/2021. FINDINGS: CT CHEST FINDINGS Cardiovascular: Heart size is normal. There is no significant pericardial fluid, thickening or pericardial  calcification. There is aortic atherosclerosis, as well as atherosclerosis of the great vessels of the mediastinum and the coronary arteries, including calcified atherosclerotic plaque in the left anterior descending and left circumflex coronary arteries. Mediastinum/Nodes: Mediastinal or hilar no pathologically enlarged lymph nodes. Esophagus is unremarkable in appearance. No axillary lymphadenopathy. Lungs/Pleura: Previously noted right upper lobe mass appears slightly less bulky than the prior examination, currently measuring 5.2 x 2.3 cm (previously 4.7 x 3.2 cm). This mass again has macrolobulated and spiculated margins, with extension to the pleura posteriorly where it is in contact with the right major fissure. The fissure appears thickened in this region, but there is no definitive extension across the fissure at this time. 5 mm left upper lobe ground-glass attenuation nodule (axial image 51 of series 4), stable. No other suspicious appearing pulmonary nodules or masses are noted. No acute consolidative airspace disease. No pleural effusions. Mild diffuse bronchial wall thickening with mild centrilobular and paraseptal emphysema. Musculoskeletal: There are no aggressive appearing lytic or blastic lesions noted in the visualized portions of the skeleton. CT ABDOMEN PELVIS FINDINGS Hepatobiliary: No suspicious cystic or solid hepatic lesions. No intra or extrahepatic biliary ductal dilatation. Gallbladder is normal in appearance. Pancreas: No pancreatic mass. No pancreatic ductal dilatation. No pancreatic or peripancreatic fluid collections or inflammatory changes. Spleen: Unremarkable. Adrenals/Urinary Tract: Subcentimeter low-attenuation lesion in the upper pole of the right kidney, too small to characterize, but similar to the prior study and statistically likely to represent a cyst. Left kidney and bilateral adrenal glands are normal in appearance. No hydroureteronephrosis. Urinary bladder is normal in  appearance. Stomach/Bowel: The appearance of the stomach is normal. There is no pathologic dilatation of small bowel or colon. Normal appendix. Vascular/Lymphatic: Aortic atherosclerosis, without evidence of aneurysm or dissection in the abdominal or pelvic vasculature. No lymphadenopathy noted in the abdomen or pelvis. Reproductive: Prostate gland and seminal vesicles are unremarkable in appearance. Other: No significant volume of ascites.  No pneumoperitoneum. Musculoskeletal: There are no aggressive appearing lytic or blastic lesions noted in the visualized portions of the skeleton. IMPRESSION: 1. Previously noted right upper lobe mass appears slightly less bulky than the prior study, suggesting some positive interval response to therapy. 2. No thoracic lymphadenopathy or other signs of metastatic disease in the abdomen or pelvis on today's examination. 3. Mild diffuse bronchial wall thickening with mild centrilobular and paraseptal emphysema; imaging findings suggestive of underlying COPD. 4. Aortic atherosclerosis, in addition to 2 vessel coronary artery disease. Please note that although the presence of coronary artery calcium documents the presence of coronary artery disease, the severity of this disease and any potential stenosis cannot be assessed on this non-gated CT examination. Assessment for potential risk factor modification, dietary therapy or pharmacologic therapy may be warranted, if clinically indicated. 5. Additional incidental findings, as above. Aortic Atherosclerosis (ICD10-I70.0) and Emphysema (ICD10-J43.9).  Electronically Signed   By: Vinnie Langton M.D.   On: 04/28/2021 10:27     ASSESSMENT AND PLAN: This is a very pleasant 65 years old African-American male diagnosed with stage IV non-small cell lung cancer, adenocarcinoma presented with right upper lobe nodule in addition to right hilar, infrahilar, subcarinal and right paratracheal adenopathy in addition to subcutaneous nodules in  the left axilla and 2 small metastatic brain lesion diagnosed in August 2021.  The patient has no actionable mutations. He is currently undergoing systemic chemotherapy with carboplatin for AUC of 5, Alimta 500 mg/M2 and Keytruda 200 mg IV every 3 weeks.  Status post 18 cycles.  Starting from cycle #5 he will be on maintenance treatment with Alimta and Keytruda every 3 weeks.  I reduce the dose of his Alimta to 400 mg/M2 secondary to the renal insufficiency. The patient is currently on treatment with single agent Keytruda and Alimta was discontinued secondary to worsening renal insufficiency. He also underwent SBRT to the enlarging right upper lobe lung mass under the care of Dr. [name]. The patient continues to tolerate his treatment with Keytruda fairly well with no concerning adverse effects. He had repeat CT scan of the chest, abdomen pelvis performed recently.  I personally and independently reviewed the scan and discussed the results with the patient today. His scan showed no concerning findings for disease recurrence or metastasis. I recommended for the patient to continue his current treatment with maintenance Keytruda and he will proceed with cycle #19 today. The patient was advised to call immediately if he has any other concerning symptoms in the interval. The patient voices understanding of current disease status and treatment options and is in agreement with the current care plan.  All questions were answered. The patient knows to call the clinic with any problems, questions or concerns. We can certainly see the patient much sooner if necessary.   Disclaimer: This note was dictated with voice recognition software. Similar sounding words can inadvertently be transcribed and may not be corrected upon review.

## 2021-04-28 NOTE — Progress Notes (Signed)
Patient has completed his Randy Andersen receiving his last card today.  He wanted to know how to get additional assistance with gas cards. Gave him card to [employer], [employer] and transportation program card which he states he has a car.  He has my card for any additional financial questions or concerns.

## 2021-04-28 NOTE — Progress Notes (Signed)
Per Dr Julien Nordmann , it is ok to treat pt today with  Keytruda and serum creatinine 1.62.

## 2021-04-29 ENCOUNTER — Telehealth: Payer: Self-pay | Admitting: Internal Medicine

## 2021-04-29 NOTE — Telephone Encounter (Signed)
Left message with follow-up appointment per 10/6 los.

## 2021-05-02 ENCOUNTER — Ambulatory Visit
Admission: RE | Admit: 2021-05-02 | Discharge: 2021-05-02 | Disposition: A | Discharge status: Home / Self Care | Payer: Medicare PPO | Source: Ambulatory Visit | Attending: Radiation Oncology | Admitting: Radiation Oncology

## 2021-05-02 DIAGNOSIS — C7931 Secondary malignant neoplasm of brain: Secondary | ICD-10-CM | POA: Insufficient documentation

## 2021-05-02 DIAGNOSIS — C3411 Malignant neoplasm of upper lobe, right bronchus or lung: Secondary | ICD-10-CM | POA: Insufficient documentation

## 2021-05-02 NOTE — Progress Notes (Signed)
  Radiation Oncology         [phone] ________________________________  Name: Randy Andersen MRN: [id number]  Date of Service: 05/02/2021  DOB: [birth year]  Post Treatment Telephone Note  Diagnosis:   Stage IV, [phone], NSCLC, adenocarcinoma of the RUL with brain metastasis and recent progressive changes in the RUL.  Interval Since Last Radiation:  4 weeks   03/23/2021 through 04/06/2021 Site Technique Total Dose (Gy) Dose per Fx (Gy) Completed Fx Beam Energies  Lung, Right: Lung_Rt IMRT 60/60 7.5 8/8 6XFFF    Narrative:  The patient was contacted today for routine follow-up. During treatment he did very well with radiotherapy and did not have significant desquamation.   Impression/Plan: 1. 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 The patient has been doing well since completion of radiotherapy. We discussed that we would be happy to continue to follow him as needed, but he will also continue to follow up with Dr. Julien Nordmann in medical oncology and also with Dr. Mickeal Skinner in the brain oncology program.      Carola Rhine, [facility]

## 2021-05-10 NOTE — Progress Notes (Signed)
[place] [id number]  DIAGNOSIS: Stage IV non-small cell lung cancer, favored to be adenocarcinoma.  He presented with posterior right upper lobe nodule as well as right hilar, infrahilar, subcarinal, and right paratracheal lymphadenopathy.  He also has a 1.4 cm x 1 cm soft tissue nodule in the skin/subcutaneous fat in the left axilla.  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  He was diagnosed in August 2021   Molecular Biomarkers: BIOMARKER(S)         % CFDNA OR AMPLIFICATION       ASSOCIATED FDA-APPROVED THERAPIES        CLINICAL TRIAL AVAILABILITY [id number] 2.9% None    Yes [id number] 0.4% None    Yes [id number] 0.2% None    Yes  PRIOR THERAPY: 1) SRS to the small metastatic brain lesion under the care of Dr. [name] on 03/18/20 2) Radiation to the enlarging right upper lobe lung mass under the care of Dr. [name]. Last dose on 04/06/21.   CURRENT THERAPY: Palliative systemic chemotherapy with carboplatin for an AUC of 5, Alimta 500 mg/m2, and Keytruda 200 mg IV every 3 weeks. First dose expected on 04/07/20. Status post 19 cycles. Starting from cycle #5 he will be on maintenance treatment with Alimta and Keytruda every 3 weeks. His dose of Alimta was reduced to 400 mg/m2. Alimta removed starting from cycle #11 due to renal insuffiencey.   INTERVAL HISTORY: Jedidiah Demartini 65 y.o. male returns to the clinic today for a follow up visit. The patient is feeling well today without any concerning complaints. He is currently undergoing single agent immunotherapy with Keytruda. He is tolerating his treatment well without any adverse side effects. Alimta was removed from his treatment plan due to renal insufficiency. He completed radiation to the enlarging right upper lobe lung mass in September 2022.   He has been trying to be more active with doing push ups and sit  ups. He is also being contentious to maintain his weight. He was previously evaluated by a member of the nutritionist team. He denies fevers, chills, recent night sweats, or shortness of breath. Denies significant cough. He denies nausea, vomiting, diarrhea, or constipation. Denies chest pain or hemoptysis. He denies rashes or skin changes. He denies headaches or visual changes. He is followed by neuro-oncology for his history of metastatic disease to the brain. It looks like he is due for repeat neuroimaging in December, which I let him know. He was previously seen by Dr. Harlow Asa for his thyroid lesion. The patient is wondering if he missed an appointment and if he is supposed to see them. He is here for evaluation and repeat blood work before starting cycle #20.      MEDICAL HISTORY: Past Medical History:  Diagnosis Date   Asthma    as a child   Chronic pain disorder    LT leg and foot   Heart murmur    as a child   Hypertension    Malignant neoplasm of upper lobe of right lung ([facility])    Stroke ([facility])    mini stroke - 2015, some tingling in fingers on right     ALLERGIES:  has No Known Allergies.  MEDICATIONS:  Current Outpatient Medications  Medication Sig Dispense Refill   amLODipine (NORVASC) 10 MG tablet Take 10 mg by mouth daily.     Cholecalciferol 100 MCG (4000 UT) TABS  Take 2 tablets by mouth daily.     doxylamine, Sleep, (UNISOM) 25 MG tablet Take 25 mg by mouth at bedtime as needed for sleep.     HYDROcodone-acetaminophen (NORCO) 10-325 MG tablet Take 1 tablet by mouth 2 (two) times daily.     oxybutynin (DITROPAN-XL) 10 MG 24 hr tablet Take by mouth.     phenazopyridine (PYRIDIUM) 200 MG tablet Take 1 tablet (200 mg total) by mouth 3 (three) times daily. 6 tablet 0   polyethylene glycol powder (GLYCOLAX/MIRALAX) 17 GM/SCOOP powder      prochlorperazine (COMPAZINE) 10 MG tablet Take 1 tablet (10 mg total) by mouth every 6 (six) hours as needed. 30 tablet 2   rivaroxaban  (XARELTO) 20 MG TABS tablet TAKE 1 TABLET BY MOUTH ONCE A DAY WITH SUPPER 30 tablet 2   senna-docusate (SENOKOT-S) 8.6-50 MG tablet Take 2 tablets by mouth daily.     tadalafil (CIALIS) 20 MG tablet Take by mouth.     tamsulosin (FLOMAX) 0.4 MG CAPS capsule Take 1 capsule (0.4 mg total) by mouth daily. 30 capsule 2   temazepam (RESTORIL) 15 MG capsule Take 1 capsule (15 mg total) by mouth at bedtime as needed for sleep. Pt is veteran - Call pt to set up delivery 30 capsule 0   No current facility-administered medications for this visit.    SURGICAL HISTORY:  Past Surgical History:  Procedure Laterality Date   BUNIONECTOMY Left    had hammer toe surgery also and callus removed   BUNIONECTOMY Right    also had hammer toe surgery and callus removed   LEG SURGERY Left    PT reports he has pins placed in his Lt leg   ORIF TIBIA PLATEAU  10/09/2012   Dr Lorin Mercy   ORIF TIBIA PLATEAU Left 10/08/2012   Procedure: OPEN REDUCTION INTERNAL FIXATION (ORIF) TIBIAL PLATEAU;  Surgeon: Marybelle Killings, MD;  Location: [facility];  Service: Orthopedics;  Laterality: Left;   VIDEO BRONCHOSCOPY WITH ENDOBRONCHIAL NAVIGATION N/A 02/25/2020   Procedure: VIDEO BRONCHOSCOPY WITH ENDOBRONCHIAL NAVIGATION with biopsies;  Surgeon: Collene Gobble, MD;  Location: [facility];  Service: Thoracic;  Laterality: N/A;   VIDEO BRONCHOSCOPY WITH ENDOBRONCHIAL ULTRASOUND N/A 02/25/2020   Procedure: VIDEO BRONCHOSCOPY WITH ENDOBRONCHIAL ULTRASOUND;  Surgeon: Collene Gobble, MD;  Location: [facility];  Service: Thoracic;  Laterality: N/A;    REVIEW OF SYSTEMS:   Review of Systems  Constitutional: Negative for appetite change, chills, fatigue, fever and unexpected weight change.  HENT: Negative for mouth sores, nosebleeds, sore throat and trouble swallowing.   Eyes: Negative for eye problems and icterus.  Respiratory: Negative for cough, hemoptysis, shortness of breath and wheezing.   Cardiovascular: Negative for chest pain and leg swelling.   Gastrointestinal: Negative for abdominal pain, constipation, diarrhea, nausea and vomiting.  Genitourinary: Negative for bladder incontinence, difficulty urinating, dysuria, frequency and hematuria.   Musculoskeletal: Negative for back pain, gait problem, neck pain and neck stiffness.  Skin: Negative for itching and rash.  Neurological: Negative for dizziness, extremity weakness, gait problem, headaches, light-headedness and seizures.  Hematological: Negative for adenopathy. Does not bruise/bleed easily.  Psychiatric/Behavioral: Negative for confusion, depression and sleep disturbance. The patient is not nervous/anxious.     PHYSICAL EXAMINATION:  Blood pressure (!) 156/87, pulse 76, temperature 97.6 F (36.4 C), temperature source Tympanic, resp. rate 18, height 6\' 2"  (1.88 m), weight 185 lb 4.8 oz (84.1 kg), SpO2 100 %.  ECOG PERFORMANCE STATUS: 1  Physical Exam  Constitutional:  Oriented to person, place, and time and well-developed, well-nourished, and in no distress.  HENT:  Head: Normocephalic and atraumatic.  Mouth/Throat: Oropharynx is clear and moist. No oropharyngeal exudate.  Eyes: Conjunctivae are normal. Right eye exhibits no discharge. Left eye exhibits no discharge. No scleral icterus.  Neck: Normal range of motion. Neck supple.  Cardiovascular: Normal rate, regular rhythm, normal heart sounds and intact distal pulses.   Pulmonary/Chest: Effort normal and breath sounds normal. No respiratory distress. No wheezes. No rales.  Abdominal: Soft. Bowel sounds are normal. Exhibits no distension and no mass. There is no tenderness.  Musculoskeletal: Normal range of motion. Exhibits no edema.  Lymphadenopathy:    No cervical adenopathy.  Neurological: Alert and oriented to person, place, and time. Exhibits normal muscle tone. Gait normal. Coordination normal.  Skin: Skin is warm and dry. No rash noted. Not diaphoretic. No erythema. No pallor.  Psychiatric: Mood, memory and  judgment normal.  Vitals reviewed.  LABORATORY DATA: Lab Results  Component Value Date   WBC 4.7 05/18/2021   HGB 13.9 05/18/2021   HCT 42.0 05/18/2021   MCV 87.0 05/18/2021   PLT 188 05/18/2021      Chemistry      Component Value Date/Time   NA 140 05/18/2021 1356   K 4.2 05/18/2021 1356   CL 108 05/18/2021 1356   CO2 22 05/18/2021 1356   BUN 26 (H) 05/18/2021 1356   CREATININE 1.54 (H) 05/18/2021 1356      Component Value Date/Time   CALCIUM 9.5 05/18/2021 1356   ALKPHOS 97 05/18/2021 1356   AST 14 (L) 05/18/2021 1356   ALT 15 05/18/2021 1356   BILITOT 0.4 05/18/2021 1356       RADIOGRAPHIC STUDIES:  CT Chest W Contrast  Result Date: 04/28/2021 CLINICAL DATA:  65 year old male with history of non-small cell lung cancer. Follow-up evaluation. EXAM: CT CHEST, ABDOMEN, AND PELVIS WITH CONTRAST TECHNIQUE: Multidetector CT imaging of the chest, abdomen and pelvis was performed following the standard protocol during bolus administration of intravenous contrast. CONTRAST:  45mL OMNIPAQUE IOHEXOL 350 MG/ML SOLN COMPARISON:  CT the chest, abdomen and pelvis 02/07/2021. FINDINGS: CT CHEST FINDINGS Cardiovascular: Heart size is normal. There is no significant pericardial fluid, thickening or pericardial calcification. There is aortic atherosclerosis, as well as atherosclerosis of the great vessels of the mediastinum and the coronary arteries, including calcified atherosclerotic plaque in the left anterior descending and left circumflex coronary arteries. Mediastinum/Nodes: Mediastinal or hilar no pathologically enlarged lymph nodes. Esophagus is unremarkable in appearance. No axillary lymphadenopathy. Lungs/Pleura: Previously noted right upper lobe mass appears slightly less bulky than the prior examination, currently measuring 5.2 x 2.3 cm (previously 4.7 x 3.2 cm). This mass again has macrolobulated and spiculated margins, with extension to the pleura posteriorly where it is in contact  with the right major fissure. The fissure appears thickened in this region, but there is no definitive extension across the fissure at this time. 5 mm left upper lobe ground-glass attenuation nodule (axial image 51 of series 4), stable. No other suspicious appearing pulmonary nodules or masses are noted. No acute consolidative airspace disease. No pleural effusions. Mild diffuse bronchial wall thickening with mild centrilobular and paraseptal emphysema. Musculoskeletal: There are no aggressive appearing lytic or blastic lesions noted in the visualized portions of the skeleton. CT ABDOMEN PELVIS FINDINGS Hepatobiliary: No suspicious cystic or solid hepatic lesions. No intra or extrahepatic biliary ductal dilatation. Gallbladder is normal in appearance. Pancreas: No pancreatic mass. No pancreatic ductal dilatation.  No pancreatic or peripancreatic fluid collections or inflammatory changes. Spleen: Unremarkable. Adrenals/Urinary Tract: Subcentimeter low-attenuation lesion in the upper pole of the right kidney, too small to characterize, but similar to the prior study and statistically likely to represent a cyst. Left kidney and bilateral adrenal glands are normal in appearance. No hydroureteronephrosis. Urinary bladder is normal in appearance. Stomach/Bowel: The appearance of the stomach is normal. There is no pathologic dilatation of small bowel or colon. Normal appendix. Vascular/Lymphatic: Aortic atherosclerosis, without evidence of aneurysm or dissection in the abdominal or pelvic vasculature. No lymphadenopathy noted in the abdomen or pelvis. Reproductive: Prostate gland and seminal vesicles are unremarkable in appearance. Other: No significant volume of ascites.  No pneumoperitoneum. Musculoskeletal: There are no aggressive appearing lytic or blastic lesions noted in the visualized portions of the skeleton. IMPRESSION: 1. Previously noted right upper lobe mass appears slightly less bulky than the prior study,  suggesting some positive interval response to therapy. 2. No thoracic lymphadenopathy or other signs of metastatic disease in the abdomen or pelvis on today's examination. 3. Mild diffuse bronchial wall thickening with mild centrilobular and paraseptal emphysema; imaging findings suggestive of underlying COPD. 4. Aortic atherosclerosis, in addition to 2 vessel coronary artery disease. Please note that although the presence of coronary artery calcium documents the presence of coronary artery disease, the severity of this disease and any potential stenosis cannot be assessed on this non-gated CT examination. Assessment for potential risk factor modification, dietary therapy or pharmacologic therapy may be warranted, if clinically indicated. 5. Additional incidental findings, as above. Aortic Atherosclerosis (ICD10-I70.0) and Emphysema (ICD10-J43.9). Electronically Signed   By: Vinnie Langton M.D.   On: 04/28/2021 10:27   CT Abdomen Pelvis W Contrast  Result Date: 04/28/2021 CLINICAL DATA:  65 year old male with history of non-small cell lung cancer. Follow-up evaluation. EXAM: CT CHEST, ABDOMEN, AND PELVIS WITH CONTRAST TECHNIQUE: Multidetector CT imaging of the chest, abdomen and pelvis was performed following the standard protocol during bolus administration of intravenous contrast. CONTRAST:  30mL OMNIPAQUE IOHEXOL 350 MG/ML SOLN COMPARISON:  CT the chest, abdomen and pelvis 02/07/2021. FINDINGS: CT CHEST FINDINGS Cardiovascular: Heart size is normal. There is no significant pericardial fluid, thickening or pericardial calcification. There is aortic atherosclerosis, as well as atherosclerosis of the great vessels of the mediastinum and the coronary arteries, including calcified atherosclerotic plaque in the left anterior descending and left circumflex coronary arteries. Mediastinum/Nodes: Mediastinal or hilar no pathologically enlarged lymph nodes. Esophagus is unremarkable in appearance. No axillary  lymphadenopathy. Lungs/Pleura: Previously noted right upper lobe mass appears slightly less bulky than the prior examination, currently measuring 5.2 x 2.3 cm (previously 4.7 x 3.2 cm). This mass again has macrolobulated and spiculated margins, with extension to the pleura posteriorly where it is in contact with the right major fissure. The fissure appears thickened in this region, but there is no definitive extension across the fissure at this time. 5 mm left upper lobe ground-glass attenuation nodule (axial image 51 of series 4), stable. No other suspicious appearing pulmonary nodules or masses are noted. No acute consolidative airspace disease. No pleural effusions. Mild diffuse bronchial wall thickening with mild centrilobular and paraseptal emphysema. Musculoskeletal: There are no aggressive appearing lytic or blastic lesions noted in the visualized portions of the skeleton. CT ABDOMEN PELVIS FINDINGS Hepatobiliary: No suspicious cystic or solid hepatic lesions. No intra or extrahepatic biliary ductal dilatation. Gallbladder is normal in appearance. Pancreas: No pancreatic mass. No pancreatic ductal dilatation. No pancreatic or peripancreatic fluid collections or inflammatory  changes. Spleen: Unremarkable. Adrenals/Urinary Tract: Subcentimeter low-attenuation lesion in the upper pole of the right kidney, too small to characterize, but similar to the prior study and statistically likely to represent a cyst. Left kidney and bilateral adrenal glands are normal in appearance. No hydroureteronephrosis. Urinary bladder is normal in appearance. Stomach/Bowel: The appearance of the stomach is normal. There is no pathologic dilatation of small bowel or colon. Normal appendix. Vascular/Lymphatic: Aortic atherosclerosis, without evidence of aneurysm or dissection in the abdominal or pelvic vasculature. No lymphadenopathy noted in the abdomen or pelvis. Reproductive: Prostate gland and seminal vesicles are unremarkable in  appearance. Other: No significant volume of ascites.  No pneumoperitoneum. Musculoskeletal: There are no aggressive appearing lytic or blastic lesions noted in the visualized portions of the skeleton. IMPRESSION: 1. Previously noted right upper lobe mass appears slightly less bulky than the prior study, suggesting some positive interval response to therapy. 2. No thoracic lymphadenopathy or other signs of metastatic disease in the abdomen or pelvis on today's examination. 3. Mild diffuse bronchial wall thickening with mild centrilobular and paraseptal emphysema; imaging findings suggestive of underlying COPD. 4. Aortic atherosclerosis, in addition to 2 vessel coronary artery disease. Please note that although the presence of coronary artery calcium documents the presence of coronary artery disease, the severity of this disease and any potential stenosis cannot be assessed on this non-gated CT examination. Assessment for potential risk factor modification, dietary therapy or pharmacologic therapy may be warranted, if clinically indicated. 5. Additional incidental findings, as above. Aortic Atherosclerosis (ICD10-I70.0) and Emphysema (ICD10-J43.9). Electronically Signed   By: Vinnie Langton M.D.   On: 04/28/2021 10:27     ASSESSMENT/PLAN:  This is a very pleasant 65 year old African-American male with stage IV non-small cell lung cancer, favored to be adenocarcinoma.  He presented with posterior right upper lobe nodule as well as right hilar, infrahilar, subcarinal, and right paratracheal lymphadenopathy.  He also has a 1.4 cm x 1 cm soft tissue nodule in the skin/subcutaneous fat in the left axilla.  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  He was diagnosed in August 2021.  Molecular studies by [facility] are negative for any actionable mutations.   The patient completed SRS to the small metastatic brain lesion under the care of Dr. [name] on 03/18/20  The patient is currently  undergoing palliative systemic chemotherapy with carboplatin for an AUC of 5, Alimta 500 mg per metered squared, and Keytruda 200 mg IV every 3 weeks.  He is status post 19 cycles. The dose of alimta was reduced to 400 mg/m2 due to renal insufficiency. Alimta was ultimately removed from the treatment plan with cycle #11 due to renal insufficiency.  The patient completed SBRT to the enlarging right upper lobe lung mass under the care of Dr. [name] on 04/06/21.   Labs were reviewed.  Recommend that he proceed cycle #20 today's schedule.  We will see him back for follow-up visit in 3 weeks for evaluation before starting cycle number 21.  He will continue to follow with neuro oncology due to his history of metastatic disease to the brain.  Unclear when he is supposed to follow up with Dr. Harlow Asa regarding the thyroid malignancy. I gave the patient the number to their office and advised him to call to ensure that he is not overdue for an appointment.   Encouraged him to continue on his current trajectory with increasing his activity/exercise. He has an old ankle injury so I discussed  pool and stationary bikes are good exercise for patient's with joint concerns.    The patient was advised to call immediately if he has any concerning symptoms in the interval. The patient voices understanding of current disease status and treatment options and is in agreement with the current care plan. All questions were answered. The patient knows to call the clinic with any problems, questions or concerns. We can certainly see the patient much sooner if necessary      No orders of the defined types were placed in this encounter.    The total time spent in the appointment was 20-29 minutes.   Brevan Luberto L Vail Vuncannon, PA-C 05/18/21

## 2021-05-17 ENCOUNTER — Encounter: Payer: Self-pay | Admitting: *Deleted

## 2021-05-17 NOTE — Progress Notes (Signed)
[facility] CSW Progress Notes  Patient called CSW today and spoke with SW Intern. Patient requested gas cards from [employer], which are ready for pick at support services desk up after his appointment at [facility] tomorrow. Pt confirmed understanding of plan.  Intern also suggested patient contact financial advocates to see if there are remaining funds in his [place] to use for gas.  Rosary Lively, Social Work Intern Supervised by Gwinda Maine, LCSW

## 2021-05-18 ENCOUNTER — Other Ambulatory Visit: Payer: Self-pay

## 2021-05-18 ENCOUNTER — Inpatient Hospital Stay: Payer: No Typology Code available for payment source

## 2021-05-18 ENCOUNTER — Inpatient Hospital Stay (HOSPITAL_BASED_OUTPATIENT_CLINIC_OR_DEPARTMENT_OTHER): Payer: No Typology Code available for payment source | Admitting: Physician Assistant

## 2021-05-18 VITALS — BP 156/87 | HR 76 | Temp 97.6°F | Resp 18 | Ht 74.0 in | Wt 185.3 lb

## 2021-05-18 DIAGNOSIS — Z5112 Encounter for antineoplastic immunotherapy: Secondary | ICD-10-CM | POA: Diagnosis not present

## 2021-05-18 DIAGNOSIS — C3411 Malignant neoplasm of upper lobe, right bronchus or lung: Secondary | ICD-10-CM | POA: Diagnosis not present

## 2021-05-18 LAB — CBC WITH DIFFERENTIAL (CANCER CENTER ONLY)
Abs Immature Granulocytes: 0.01 10*3/uL (ref 0.00–0.07)
Basophils Absolute: 0 10*3/uL (ref 0.0–0.1)
Basophils Relative: 1 %
Eosinophils Absolute: 0.1 10*3/uL (ref 0.0–0.5)
Eosinophils Relative: 3 %
HCT: 42 % (ref 39.0–52.0)
Hemoglobin: 13.9 g/dL (ref 13.0–17.0)
Immature Granulocytes: 0 %
Lymphocytes Relative: 22 %
Lymphs Abs: 1.1 10*3/uL (ref 0.7–4.0)
MCH: 28.8 pg (ref 26.0–34.0)
MCHC: 33.1 g/dL (ref 30.0–36.0)
MCV: 87 fL (ref 80.0–100.0)
Monocytes Absolute: 0.8 10*3/uL (ref 0.1–1.0)
Monocytes Relative: 17 %
Neutro Abs: 2.7 10*3/uL (ref 1.7–7.7)
Neutrophils Relative %: 57 %
Platelet Count: 188 10*3/uL (ref 150–400)
RBC: 4.83 MIL/uL (ref 4.22–5.81)
RDW: 14.9 % (ref 11.5–15.5)
WBC Count: 4.7 10*3/uL (ref 4.0–10.5)
nRBC: 0 % (ref 0.0–0.2)

## 2021-05-18 LAB — CMP (CANCER CENTER ONLY)
ALT: 15 U/L (ref 0–44)
AST: 14 U/L — ABNORMAL LOW (ref 15–41)
Albumin: 4 g/dL (ref 3.5–5.0)
Alkaline Phosphatase: 97 U/L (ref 38–126)
Anion gap: 10 (ref 5–15)
BUN: 26 mg/dL — ABNORMAL HIGH (ref 8–23)
CO2: 22 mmol/L (ref 22–32)
Calcium: 9.5 mg/dL (ref 8.9–10.3)
Chloride: 108 mmol/L (ref 98–111)
Creatinine: 1.54 mg/dL — ABNORMAL HIGH (ref 0.61–1.24)
GFR, Estimated: 49 mL/min — ABNORMAL LOW (ref >60)
Glucose, Bld: 100 mg/dL — ABNORMAL HIGH (ref 70–99)
Potassium: 4.2 mmol/L (ref 3.5–5.1)
Sodium: 140 mmol/L (ref 135–145)
Total Bilirubin: 0.4 mg/dL (ref 0.3–1.2)
Total Protein: 7.7 g/dL (ref 6.5–8.1)

## 2021-05-18 LAB — TSH: TSH: 1.536 u[IU]/mL (ref 0.320–4.118)

## 2021-05-18 MED ORDER — SODIUM CHLORIDE 0.9 % IV SOLN
200.0000 mg | Freq: Once | INTRAVENOUS | Status: AC
  Administered 2021-05-18: 200 mg via INTRAVENOUS
  Filled 2021-05-18: qty 8

## 2021-05-18 MED ORDER — SODIUM CHLORIDE 0.9 % IV SOLN
Freq: Once | INTRAVENOUS | Status: AC
Start: 2021-05-18 — End: 2021-05-18

## 2021-05-18 NOTE — Patient Instructions (Signed)
[facility] MEDICAL [facility]COLOG[facility]  Discharge Instructions: ?Thank you for choosing [facility] to provide your oncology and hematology care.  ? ?If you have a lab appointment with the Cancer Center, please go directly to the Cancer Center and check in at the registration area. ?  ?Wear comfortable clothing and clothing appropriate for easy access to any Portacath or PICC line.  ? ?We strive to give you quality time with your provider. You may need to reschedule your appointment if you arrive late (15 or more minutes).  Arriving late affects you and other patients whose appointments are after yours.  Also, if you miss three or more appointments without notifying the office, you may be dismissed from the clinic at the provider?s discretion.    ?  ?For prescription refill requests, have your pharmacy contact our office and allow 72 hours for refills to be completed.   ? ?Today you received the following chemotherapy and/or immunotherapy agents: Keytruda ?  ?To help prevent nausea and vomiting after your treatment, we encourage you to take your nausea medication as directed. ? ?BELOW ARE SYMPTOMS THAT SHOULD BE REPORTED IMMEDIATELY: ?*FEVER GREATER THAN 100.4 F (38 ?C) OR HIGHER ?*CHILLS OR SWEATING ?*NAUSEA AND VOMITING THAT IS NOT CONTROLLED WITH YOUR NAUSEA MEDICATION ?*UNUSUAL SHORTNESS OF BREATH ?*UNUSUAL BRUISING OR BLEEDING ?*URINARY PROBLEMS (pain or burning when urinating, or frequent urination) ?*BOWEL PROBLEMS (unusual diarrhea, constipation, pain near the anus) ?TENDERNESS IN MOUTH AND THROAT WITH OR WITHOUT PRESENCE OF ULCERS (sore throat, sores in mouth, or a toothache) ?UNUSUAL RASH, SWELLING OR PAIN  ?UNUSUAL VAGINAL DISCHARGE OR ITCHING  ? ?Items with * indicate a potential emergency and should be followed up as soon as possible or go to the Emergency Department if any problems should occur. ? ?Please show the CHEMOTHERAPY ALERT CARD or IMMUNOTHERAPY ALERT CARD at check-in to the  Emergency Department and triage nurse. ? ?Should you have questions after your visit or need to cancel or reschedule your appointment, please contact [facility]  Dept: [phone]  and follow the prompts.  Office hours are 8:00 a.m. to 4:30 p.m. Monday - Friday. Please note that voicemails left after 4:00 p.m. may not be returned until the following business day.  We are closed weekends and major holidays. You have access to a nurse at all times for urgent questions. Please call the main number to the clinic Dept: [phone] and follow the prompts. ? ? ?For any non-urgent questions, you may also contact your provider using MyChart. We now offer e-Visits for anyone 18 and older to request care online for non-urgent symptoms. For details visit [email or web address]. ?  ?Also download the MyChart app! Go to the app store, search "MyChart", open the app, select [facility], and log in with your MyChart username and password. ? ?Due to Covid, a mask is required upon entering the hospital/clinic. If you do not have a mask, one will be given to you upon arrival. For doctor visits, patients may have 1 support person aged 18 or older with them. For treatment visits, patients cannot have anyone with them due to current Covid guidelines and our immunocompromised population.  ? ?

## 2021-05-18 NOTE — Progress Notes (Signed)
Ok to treat with scr 1.54 per [employer], [place]

## 2021-06-08 ENCOUNTER — Inpatient Hospital Stay (HOSPITAL_BASED_OUTPATIENT_CLINIC_OR_DEPARTMENT_OTHER): Payer: No Typology Code available for payment source | Admitting: Internal Medicine

## 2021-06-08 ENCOUNTER — Encounter: Payer: Self-pay | Admitting: Internal Medicine

## 2021-06-08 ENCOUNTER — Inpatient Hospital Stay: Payer: No Typology Code available for payment source | Attending: Internal Medicine

## 2021-06-08 ENCOUNTER — Inpatient Hospital Stay: Payer: No Typology Code available for payment source

## 2021-06-08 ENCOUNTER — Other Ambulatory Visit: Payer: Self-pay

## 2021-06-08 VITALS — BP 140/91 | HR 95 | Temp 97.8°F | Resp 18 | Ht 74.0 in | Wt 185.4 lb

## 2021-06-08 DIAGNOSIS — N289 Disorder of kidney and ureter, unspecified: Secondary | ICD-10-CM | POA: Insufficient documentation

## 2021-06-08 DIAGNOSIS — C7931 Secondary malignant neoplasm of brain: Secondary | ICD-10-CM | POA: Insufficient documentation

## 2021-06-08 DIAGNOSIS — Z5112 Encounter for antineoplastic immunotherapy: Secondary | ICD-10-CM | POA: Diagnosis not present

## 2021-06-08 DIAGNOSIS — Z79899 Other long term (current) drug therapy: Secondary | ICD-10-CM | POA: Diagnosis not present

## 2021-06-08 DIAGNOSIS — C3411 Malignant neoplasm of upper lobe, right bronchus or lung: Secondary | ICD-10-CM | POA: Insufficient documentation

## 2021-06-08 LAB — CBC WITH DIFFERENTIAL (CANCER CENTER ONLY)
Abs Immature Granulocytes: 0.02 10*3/uL (ref 0.00–0.07)
Basophils Absolute: 0.1 10*3/uL (ref 0.0–0.1)
Basophils Relative: 1 %
Eosinophils Absolute: 0.2 10*3/uL (ref 0.0–0.5)
Eosinophils Relative: 3 %
HCT: 43 % (ref 39.0–52.0)
Hemoglobin: 14.5 g/dL (ref 13.0–17.0)
Immature Granulocytes: 0 %
Lymphocytes Relative: 23 %
Lymphs Abs: 1.4 10*3/uL (ref 0.7–4.0)
MCH: 29.3 pg (ref 26.0–34.0)
MCHC: 33.7 g/dL (ref 30.0–36.0)
MCV: 86.9 fL (ref 80.0–100.0)
Monocytes Absolute: 0.7 10*3/uL (ref 0.1–1.0)
Monocytes Relative: 10 %
Neutro Abs: 4 10*3/uL (ref 1.7–7.7)
Neutrophils Relative %: 63 %
Platelet Count: 186 10*3/uL (ref 150–400)
RBC: 4.95 MIL/uL (ref 4.22–5.81)
RDW: 14.3 % (ref 11.5–15.5)
WBC Count: 6.4 10*3/uL (ref 4.0–10.5)
nRBC: 0 % (ref 0.0–0.2)

## 2021-06-08 LAB — CMP (CANCER CENTER ONLY)
ALT: 15 U/L (ref 0–44)
AST: 16 U/L (ref 15–41)
Albumin: 4.3 g/dL (ref 3.5–5.0)
Alkaline Phosphatase: 106 U/L (ref 38–126)
Anion gap: 11 (ref 5–15)
BUN: 15 mg/dL (ref 8–23)
CO2: 22 mmol/L (ref 22–32)
Calcium: 9.7 mg/dL (ref 8.9–10.3)
Chloride: 107 mmol/L (ref 98–111)
Creatinine: 1.54 mg/dL — ABNORMAL HIGH (ref 0.61–1.24)
GFR, Estimated: 49 mL/min — ABNORMAL LOW (ref >60)
Glucose, Bld: 101 mg/dL — ABNORMAL HIGH (ref 70–99)
Potassium: 3.3 mmol/L — ABNORMAL LOW (ref 3.5–5.1)
Sodium: 140 mmol/L (ref 135–145)
Total Bilirubin: 0.5 mg/dL (ref 0.3–1.2)
Total Protein: 8 g/dL (ref 6.5–8.1)

## 2021-06-08 LAB — TSH: TSH: 1.717 u[IU]/mL (ref 0.320–4.118)

## 2021-06-08 MED ORDER — SODIUM CHLORIDE 0.9 % IV SOLN: Freq: Once | INTRAVENOUS | Status: AC

## 2021-06-08 MED ORDER — SODIUM CHLORIDE 0.9 % IV SOLN
200.0000 mg | Freq: Once | INTRAVENOUS | Status: AC
  Administered 2021-06-08: 200 mg via INTRAVENOUS
  Filled 2021-06-08: qty 8

## 2021-06-08 NOTE — Patient Instructions (Signed)
[facility]  Discharge Instructions: ?Thank you for choosing [facility] to provide your oncology and hematology care.  ? ?If you have a lab appointment with the Cancer Center, please go directly to the Cancer Center and check in at the registration area. ?  ?Wear comfortable clothing and clothing appropriate for easy access to any Portacath or PICC line.  ? ?We strive to give you quality time with your provider. You may need to reschedule your appointment if you arrive late (15 or more minutes).  Arriving late affects you and other patients whose appointments are after yours.  Also, if you miss three or more appointments without notifying the office, you may be dismissed from the clinic at the provider?s discretion.    ?  ?For prescription refill requests, have your pharmacy contact our office and allow 72 hours for refills to be completed.   ? ?Today you received the following chemotherapy and/or immunotherapy agents: Keytruda ?  ?To help prevent nausea and vomiting after your treatment, we encourage you to take your nausea medication as directed. ? ?BELOW ARE SYMPTOMS THAT SHOULD BE REPORTED IMMEDIATELY: ?*FEVER GREATER THAN 100.4 F (38 ?C) OR HIGHER ?*CHILLS OR SWEATING ?*NAUSEA AND VOMITING THAT IS NOT CONTROLLED WITH YOUR NAUSEA MEDICATION ?*UNUSUAL SHORTNESS OF BREATH ?*UNUSUAL BRUISING OR BLEEDING ?*URINARY PROBLEMS (pain or burning when urinating, or frequent urination) ?*BOWEL PROBLEMS (unusual diarrhea, constipation, pain near the anus) ?TENDERNESS IN MOUTH AND THROAT WITH OR WITHOUT PRESENCE OF ULCERS (sore throat, sores in mouth, or a toothache) ?UNUSUAL RASH, SWELLING OR PAIN  ?UNUSUAL VAGINAL DISCHARGE OR ITCHING  ? ?Items with * indicate a potential emergency and should be followed up as soon as possible or go to the Emergency Department if any problems should occur. ? ?Please show the CHEMOTHERAPY ALERT CARD or IMMUNOTHERAPY ALERT CARD at check-in to the  Emergency Department and triage nurse. ? ?Should you have questions after your visit or need to cancel or reschedule your appointment, please contact [facility]  Dept: [phone]  and follow the prompts.  Office hours are 8:00 a.m. to 4:30 p.m. Monday - Friday. Please note that voicemails left after 4:00 p.m. may not be returned until the following business day.  We are closed weekends and major holidays. You have access to a nurse at all times for urgent questions. Please call the main number to the clinic Dept: [phone] and follow the prompts. ? ? ?For any non-urgent questions, you may also contact your provider using MyChart. We now offer e-Visits for anyone 18 and older to request care online for non-urgent symptoms. For details visit [email or web address]. ?  ?Also download the MyChart app! Go to the app store, search "MyChart", open the app, select , and log in with your MyChart username and password. ? ?Due to Covid, a mask is required upon entering the hospital/clinic. If you do not have a mask, one will be given to you upon arrival. For doctor visits, patients may have 1 support person aged 18 or older with them. For treatment visits, patients cannot have anyone with them due to current Covid guidelines and our immunocompromised population.  ? ?

## 2021-06-08 NOTE — Progress Notes (Signed)
Per Dr. Julien Nordmann, pt okay to treat today with creatinine of 1.54.

## 2021-06-08 NOTE — Progress Notes (Signed)
[facility] Telephone:(336) [phone]   Fax:(336) [facility] [address] [place] [address]  DIAGNOSIS: Stage IV non-small cell lung cancer, favored to be adenocarcinoma.  He presented with posterior right upper lobe nodule as well as right hilar, infrahilar, subcarinal, and right paratracheal lymphadenopathy.  He also has a 1.4 cm x 1 cm soft tissue nodule in the skin/subcutaneous fat in the left axilla.  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  He was diagnosed in August 2021   Molecular Biomarkers: BIOMARKER(S)         % CFDNA OR AMPLIFICATION       ASSOCIATED FDA-APPROVED THERAPIES        CLINICAL TRIAL AVAILABILITY [id number] 2.9% None    Yes [id number] 0.4% None    Yes [id number] 0.2% None    Yes   PRIOR THERAPY:  1) SRS to the small metastatic brain lesion under the care of Dr. [name] on 03/18/20   CURRENT THERAPY:  1) Palliative systemic chemotherapy with carboplatin for an AUC of 5, Alimta 500 mg/m2, and Keytruda 200 mg IV every 3 weeks. First dose expected on 04/07/20.  Status post 20 cycles.  Starting from cycle #5 he will be on maintenance treatment with Alimta and Keytruda every 3 weeks.  For the last few cycles he is currently on maintenance treatment with single agent Keytruda. 2) SBRT to the enlarging right upper lobe lung mass under the care of Dr. [name].  INTERVAL HISTORY: Randy Andersen 65 y.o. male returns to the clinic today for follow-up visit.  The patient is feeling fine today with no concerning complaints.  He denied having any chest pain, shortness of breath, cough or hemoptysis.  He denied having any fever or chills.  He has no nausea, vomiting, diarrhea or constipation.  He has no headache or visual changes.  He has no skin rash or itching.  He is here today for evaluation before starting cycle #20 one of his treatment.  MEDICAL HISTORY: Past  Medical History:  Diagnosis Date   Asthma    as a child   Chronic pain disorder    LT leg and foot   Heart murmur    as a child   Hypertension    Malignant neoplasm of upper lobe of right lung ([place])    Stroke ([place])    mini stroke - 2015, some tingling in fingers on right     ALLERGIES:  has No Known Allergies.  MEDICATIONS:  Current Outpatient Medications  Medication Sig Dispense Refill   amLODipine (NORVASC) 10 MG tablet Take 10 mg by mouth daily.     Cholecalciferol 100 MCG (4000 UT) TABS Take 2 tablets by mouth daily.     doxylamine, Sleep, (UNISOM) 25 MG tablet Take 25 mg by mouth at bedtime as needed for sleep.     HYDROcodone-acetaminophen (NORCO) 10-325 MG tablet Take 1 tablet by mouth 2 (two) times daily.     oxybutynin (DITROPAN-XL) 10 MG 24 hr tablet Take by mouth.     phenazopyridine (PYRIDIUM) 200 MG tablet Take 1 tablet (200 mg total) by mouth 3 (three) times daily. 6 tablet 0   polyethylene glycol powder (GLYCOLAX/MIRALAX) 17 GM/SCOOP powder      prochlorperazine (COMPAZINE) 10 MG tablet Take 1 tablet (10 mg total) by mouth every 6 (six) hours as needed. 30 tablet 2   rivaroxaban (XARELTO) 20 MG TABS  tablet TAKE 1 TABLET BY MOUTH ONCE A DAY WITH SUPPER 30 tablet 2   senna-docusate (SENOKOT-S) 8.6-50 MG tablet Take 2 tablets by mouth daily.     tadalafil (CIALIS) 20 MG tablet Take by mouth.     tamsulosin (FLOMAX) 0.4 MG CAPS capsule Take 1 capsule (0.4 mg total) by mouth daily. 30 capsule 2   temazepam (RESTORIL) 15 MG capsule Take 1 capsule (15 mg total) by mouth at bedtime as needed for sleep. Pt is veteran - Call pt to set up delivery 30 capsule 0   No current facility-administered medications for this visit.    SURGICAL HISTORY:  Past Surgical History:  Procedure Laterality Date   BUNIONECTOMY Left    had hammer toe surgery also and callus removed   BUNIONECTOMY Right    also had hammer toe surgery and callus removed   LEG SURGERY Left    PT reports he  has pins placed in his Lt leg   ORIF TIBIA PLATEAU  10/09/2012   Dr Lorin Mercy   ORIF TIBIA PLATEAU Left 10/08/2012   Procedure: OPEN REDUCTION INTERNAL FIXATION (ORIF) TIBIAL PLATEAU;  Surgeon: Marybelle Killings, MD;  Location: [facility];  Service: Orthopedics;  Laterality: Left;   VIDEO BRONCHOSCOPY WITH ENDOBRONCHIAL NAVIGATION N/A 02/25/2020   Procedure: VIDEO BRONCHOSCOPY WITH ENDOBRONCHIAL NAVIGATION with biopsies;  Surgeon: Collene Gobble, MD;  Location: [facility];  Service: Thoracic;  Laterality: N/A;   VIDEO BRONCHOSCOPY WITH ENDOBRONCHIAL ULTRASOUND N/A 02/25/2020   Procedure: VIDEO BRONCHOSCOPY WITH ENDOBRONCHIAL ULTRASOUND;  Surgeon: Collene Gobble, MD;  [facility];  Service: Thoracic;  Laterality: N/A;    REVIEW OF SYSTEMS:  A comprehensive review of systems was negative.   PHYSICAL EXAMINATION: General appearance: alert, cooperative, and no distress Head: Normocephalic, without obvious abnormality, atraumatic Neck: no adenopathy, no JVD, supple, symmetrical, trachea midline, and thyroid not enlarged, symmetric, no tenderness/mass/nodules Lymph nodes: Cervical, supraclavicular, and axillary nodes normal. Resp: clear to auscultation bilaterally Back: symmetric, no curvature. ROM normal. No CVA tenderness. Cardio: regular rate and rhythm, S1, S2 normal, no murmur, click, rub or gallop GI: soft, non-tender; bowel sounds normal; no masses,  no organomegaly Extremities: extremities normal, atraumatic, no cyanosis or edema  ECOG PERFORMANCE STATUS: 1 - Symptomatic but completely ambulatory  Blood pressure (!) 140/91, pulse 95, temperature 97.8 F (36.6 C), temperature source Tympanic, resp. rate 18, height 6\' 2"  (1.88 m), weight 185 lb 6.4 oz (84.1 kg), SpO2 100 %.  LABORATORY DATA: Lab Results  Component Value Date   WBC 6.4 06/08/2021   HGB 14.5 06/08/2021   HCT 43.0 06/08/2021   MCV 86.9 06/08/2021   PLT 186 06/08/2021      Chemistry      Component Value Date/Time   NA 140  06/08/2021 0858   K 3.3 (L) 06/08/2021 0858   CL 107 06/08/2021 0858   CO2 22 06/08/2021 0858   BUN 15 06/08/2021 0858   CREATININE 1.54 (H) 06/08/2021 0858      Component Value Date/Time   CALCIUM 9.7 06/08/2021 0858   ALKPHOS 106 06/08/2021 0858   AST 16 06/08/2021 0858   ALT 15 06/08/2021 0858   BILITOT 0.5 06/08/2021 0858       RADIOGRAPHIC STUDIES: No results found.   ASSESSMENT AND PLAN: This is a very pleasant 65 years old African-American male diagnosed with stage IV non-small cell lung cancer, adenocarcinoma presented with right upper lobe nodule in addition to right hilar, infrahilar, subcarinal and right paratracheal adenopathy  in addition to subcutaneous nodules in the left axilla and 2 small metastatic brain lesion diagnosed in August 2021.  The patient has no actionable mutations. He is currently undergoing systemic chemotherapy with carboplatin for AUC of 5, Alimta 500 mg/M2 and Keytruda 200 mg IV every 3 weeks.  Status post 20 cycles.  Starting from cycle #5 he will be on maintenance treatment with Alimta and Keytruda every 3 weeks.  I reduce the dose of his Alimta to 400 mg/M2 secondary to the renal insufficiency. The patient is currently on treatment with single agent Keytruda and Alimta was discontinued secondary to worsening renal insufficiency. He also underwent SBRT to the enlarging right upper lobe lung mass under the care of Dr. [name]. The patient continues to tolerate his treatment with single agent Keytruda fairly well. I recommended for him to proceed with cycle #21 today as planned. He will come back for follow-up visit in 3 weeks for evaluation before the next cycle of his treatment. The patient was advised to call immediately if he has any other concerning symptoms in the interval. The patient voices understanding of current disease status and treatment options and is in agreement with the current care plan.  All questions were answered. The patient knows  to call the clinic with any problems, questions or concerns. We can certainly see the patient much sooner if necessary.   Disclaimer: This note was dictated with voice recognition software. Similar sounding words can inadvertently be transcribed and may not be corrected upon review.

## 2021-06-23 ENCOUNTER — Ambulatory Visit: Payer: No Typology Code available for payment source | Admitting: Internal Medicine

## 2021-06-29 ENCOUNTER — Inpatient Hospital Stay: Payer: No Typology Code available for payment source

## 2021-06-29 ENCOUNTER — Inpatient Hospital Stay: Payer: No Typology Code available for payment source | Attending: Internal Medicine

## 2021-06-29 ENCOUNTER — Other Ambulatory Visit: Payer: Self-pay

## 2021-06-29 ENCOUNTER — Encounter: Payer: Self-pay | Admitting: General Practice

## 2021-06-29 ENCOUNTER — Inpatient Hospital Stay (HOSPITAL_BASED_OUTPATIENT_CLINIC_OR_DEPARTMENT_OTHER): Payer: No Typology Code available for payment source | Admitting: Internal Medicine

## 2021-06-29 ENCOUNTER — Other Ambulatory Visit: Payer: Self-pay | Admitting: Internal Medicine

## 2021-06-29 VITALS — BP 143/91 | HR 78 | Temp 97.8°F | Resp 18 | Ht 74.0 in | Wt 190.6 lb

## 2021-06-29 DIAGNOSIS — C7931 Secondary malignant neoplasm of brain: Secondary | ICD-10-CM | POA: Insufficient documentation

## 2021-06-29 DIAGNOSIS — C3411 Malignant neoplasm of upper lobe, right bronchus or lung: Secondary | ICD-10-CM

## 2021-06-29 DIAGNOSIS — C3490 Malignant neoplasm of unspecified part of unspecified bronchus or lung: Secondary | ICD-10-CM

## 2021-06-29 DIAGNOSIS — Z79899 Other long term (current) drug therapy: Secondary | ICD-10-CM | POA: Diagnosis not present

## 2021-06-29 DIAGNOSIS — Z5112 Encounter for antineoplastic immunotherapy: Secondary | ICD-10-CM | POA: Insufficient documentation

## 2021-06-29 LAB — CBC WITH DIFFERENTIAL (CANCER CENTER ONLY)
Abs Immature Granulocytes: 0.01 10*3/uL (ref 0.00–0.07)
Basophils Absolute: 0 10*3/uL (ref 0.0–0.1)
Basophils Relative: 1 %
Eosinophils Absolute: 0.1 10*3/uL (ref 0.0–0.5)
Eosinophils Relative: 4 %
HCT: 41.9 % (ref 39.0–52.0)
Hemoglobin: 14.4 g/dL (ref 13.0–17.0)
Immature Granulocytes: 0 %
Lymphocytes Relative: 25 %
Lymphs Abs: 0.9 10*3/uL (ref 0.7–4.0)
MCH: 29.7 pg (ref 26.0–34.0)
MCHC: 34.4 g/dL (ref 30.0–36.0)
MCV: 86.4 fL (ref 80.0–100.0)
Monocytes Absolute: 0.5 10*3/uL (ref 0.1–1.0)
Monocytes Relative: 15 %
Neutro Abs: 2 10*3/uL (ref 1.7–7.7)
Neutrophils Relative %: 55 %
Platelet Count: 173 10*3/uL (ref 150–400)
RBC: 4.85 MIL/uL (ref 4.22–5.81)
RDW: 14.2 % (ref 11.5–15.5)
WBC Count: 3.7 10*3/uL — ABNORMAL LOW (ref 4.0–10.5)
nRBC: 0 % (ref 0.0–0.2)

## 2021-06-29 LAB — CMP (CANCER CENTER ONLY)
ALT: 13 U/L (ref 0–44)
AST: 14 U/L — ABNORMAL LOW (ref 15–41)
Albumin: 3.8 g/dL (ref 3.5–5.0)
Alkaline Phosphatase: 107 U/L (ref 38–126)
Anion gap: 10 (ref 5–15)
BUN: 16 mg/dL (ref 8–23)
CO2: 22 mmol/L (ref 22–32)
Calcium: 9.2 mg/dL (ref 8.9–10.3)
Chloride: 109 mmol/L (ref 98–111)
Creatinine: 1.7 mg/dL — ABNORMAL HIGH (ref 0.61–1.24)
GFR, Estimated: 44 mL/min — ABNORMAL LOW (ref >60)
Glucose, Bld: 103 mg/dL — ABNORMAL HIGH (ref 70–99)
Potassium: 4.1 mmol/L (ref 3.5–5.1)
Sodium: 141 mmol/L (ref 135–145)
Total Bilirubin: 0.3 mg/dL (ref 0.3–1.2)
Total Protein: 7.7 g/dL (ref 6.5–8.1)

## 2021-06-29 LAB — TSH: TSH: 1.164 u[IU]/mL (ref 0.320–4.118)

## 2021-06-29 MED ORDER — SODIUM CHLORIDE 0.9 % IV SOLN
Freq: Once | INTRAVENOUS | Status: AC
Start: 2021-06-29 — End: 2021-06-29

## 2021-06-29 MED ORDER — SODIUM CHLORIDE 0.9 % IV SOLN
200.0000 mg | Freq: Once | INTRAVENOUS | Status: AC
  Administered 2021-06-29: 200 mg via INTRAVENOUS
  Filled 2021-06-29: qty 8

## 2021-06-29 NOTE — Progress Notes (Signed)
OK to treat without today's CMET results per Dr Julien Nordmann

## 2021-06-29 NOTE — Patient Instructions (Signed)
[facility]  Discharge Instructions: ?Thank you for choosing [facility] to provide your oncology and hematology care.  ? ?If you have a lab appointment with the Cancer Center, please go directly to the Cancer Center and check in at the registration area. ?  ?Wear comfortable clothing and clothing appropriate for easy access to any Portacath or PICC line.  ? ?We strive to give you quality time with your provider. You may need to reschedule your appointment if you arrive late (15 or more minutes).  Arriving late affects you and other patients whose appointments are after yours.  Also, if you miss three or more appointments without notifying the office, you may be dismissed from the clinic at the provider?s discretion.    ?  ?For prescription refill requests, have your pharmacy contact our office and allow 72 hours for refills to be completed.   ? ?Today you received the following chemotherapy and/or immunotherapy agents: Keytruda ?  ?To help prevent nausea and vomiting after your treatment, we encourage you to take your nausea medication as directed. ? ?BELOW ARE SYMPTOMS THAT SHOULD BE REPORTED IMMEDIATELY: ?*FEVER GREATER THAN 100.4 F (38 ?C) OR HIGHER ?*CHILLS OR SWEATING ?*NAUSEA AND VOMITING THAT IS NOT CONTROLLED WITH YOUR NAUSEA MEDICATION ?*UNUSUAL SHORTNESS OF BREATH ?*UNUSUAL BRUISING OR BLEEDING ?*URINARY PROBLEMS (pain or burning when urinating, or frequent urination) ?*BOWEL PROBLEMS (unusual diarrhea, constipation, pain near the anus) ?TENDERNESS IN MOUTH AND THROAT WITH OR WITHOUT PRESENCE OF ULCERS (sore throat, sores in mouth, or a toothache) ?UNUSUAL RASH, SWELLING OR PAIN  ?UNUSUAL VAGINAL DISCHARGE OR ITCHING  ? ?Items with * indicate a potential emergency and should be followed up as soon as possible or go to the Emergency Department if any problems should occur. ? ?Please show the CHEMOTHERAPY ALERT CARD or IMMUNOTHERAPY ALERT CARD at check-in to the  Emergency Department and triage nurse. ? ?Should you have questions after your visit or need to cancel or reschedule your appointment, please contact [facility]  Dept: [phone]  and follow the prompts.  Office hours are 8:00 a.m. to 4:30 p.m. Monday - Friday. Please note that voicemails left after 4:00 p.m. may not be returned until the following business day.  We are closed weekends and major holidays. You have access to a nurse at all times for urgent questions. Please call the main number to the clinic Dept: [phone] and follow the prompts. ? ? ?For any non-urgent questions, you may also contact your provider using MyChart. We now offer e-Visits for anyone 18 and older to request care online for non-urgent symptoms. For details visit [email or web address]. ?  ?Also download the MyChart app! Go to the app store, search "MyChart", open the app, select [facility], and log in with your MyChart username and password. ? ?Due to Covid, a mask is required upon entering the hospital/clinic. If you do not have a mask, one will be given to you upon arrival. For doctor visits, patients may have 1 support person aged 18 or older with them. For treatment visits, patients cannot have anyone with them due to current Covid guidelines and our immunocompromised population.  ? ?

## 2021-06-29 NOTE — Progress Notes (Signed)
[facility] Telephone:([phone]   Fax:(336) [facility] [address] [place] [address]  DIAGNOSIS: Stage IV non-small cell lung cancer, favored to be adenocarcinoma.  He presented with posterior right upper lobe nodule as well as right hilar, infrahilar, subcarinal, and right paratracheal lymphadenopathy.  He also has a 1.4 cm x 1 cm soft tissue nodule in the skin/subcutaneous fat in the left axilla.  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  He was diagnosed in August 2021   Molecular Biomarkers: BIOMARKER(S)         % CFDNA OR AMPLIFICATION       ASSOCIATED FDA-APPROVED THERAPIES        CLINICAL TRIAL AVAILABILITY [id number] 2.9% None    Yes [id number] 0.4% None    Yes [id number] 0.2% None    Yes   PRIOR THERAPY:  1) SRS to the small metastatic brain lesion under the care of Dr. [name] on 03/18/20   CURRENT THERAPY:  1) Palliative systemic chemotherapy with carboplatin for an AUC of 5, Alimta 500 mg/m2, and Keytruda 200 mg IV every 3 weeks. First dose expected on 04/07/20.  Status post 21 cycles.  Starting from cycle #5 he will be on maintenance treatment with Alimta and Keytruda every 3 weeks.  For the last few cycles he is currently on maintenance treatment with single agent Keytruda. 2) SBRT to the enlarging right upper lobe lung mass under the care of Dr. [name].  INTERVAL HISTORY: Randy Andersen 65 y.o. male returns to the clinic today for follow-up visit.  The patient has no complaints today.  He gained around 5 pounds in the last few weeks.  He denied having any chest pain, shortness of breath, cough or hemoptysis.  He denied having any fever or chills.  He has no nausea, vomiting, diarrhea or constipation.  He has no headache or visual changes.  He is here today for evaluation before starting cycle #22 of his treatment.  MEDICAL HISTORY: Past Medical History:   Diagnosis Date   Asthma    as a child   Chronic pain disorder    LT leg and foot   Heart murmur    as a child   Hypertension    Malignant neoplasm of upper lobe of right lung ([facility] [place])    Stroke ([place])    mini stroke - 2015, some tingling in fingers on right     ALLERGIES:  has No Known Allergies.  MEDICATIONS:  Current Outpatient Medications  Medication Sig Dispense Refill   amLODipine (NORVASC) 10 MG tablet Take 10 mg by mouth daily.     Cholecalciferol 100 MCG (4000 UT) TABS Take 2 tablets by mouth daily.     doxylamine, Sleep, (UNISOM) 25 MG tablet Take 25 mg by mouth at bedtime as needed for sleep.     HYDROcodone-acetaminophen (NORCO) 10-325 MG tablet Take 1 tablet by mouth 2 (two) times daily.     oxybutynin (DITROPAN-XL) 10 MG 24 hr tablet Take by mouth.     phenazopyridine (PYRIDIUM) 200 MG tablet Take 1 tablet (200 mg total) by mouth 3 (three) times daily. 6 tablet 0   polyethylene glycol powder (GLYCOLAX/MIRALAX) 17 GM/SCOOP powder      prochlorperazine (COMPAZINE) 10 MG tablet Take 1 tablet (10 mg total) by mouth every 6 (six) hours as needed. 30 tablet 2   rivaroxaban (XARELTO) 20 MG TABS tablet TAKE  1 TABLET BY MOUTH ONCE A DAY WITH SUPPER 30 tablet 2   senna-docusate (SENOKOT-S) 8.6-50 MG tablet Take 2 tablets by mouth daily.     tadalafil (CIALIS) 20 MG tablet Take by mouth.     tamsulosin (FLOMAX) 0.4 MG CAPS capsule Take 1 capsule (0.4 mg total) by mouth daily. 30 capsule 2   temazepam (RESTORIL) 15 MG capsule Take 1 capsule (15 mg total) by mouth at bedtime as needed for sleep. Pt is veteran - Call pt to set up delivery 30 capsule 0   No current facility-administered medications for this visit.    SURGICAL HISTORY:  Past Surgical History:  Procedure Laterality Date   BUNIONECTOMY Left    had hammer toe surgery also and callus removed   BUNIONECTOMY Right    also had hammer toe surgery and callus removed   LEG SURGERY Left    PT reports he has pins placed in  his Lt leg   ORIF TIBIA PLATEAU  10/09/2012   Dr Lorin Mercy   ORIF TIBIA PLATEAU Left 10/08/2012   Procedure: OPEN REDUCTION INTERNAL FIXATION (ORIF) TIBIAL PLATEAU;  Surgeon: Marybelle Killings, MD;  Location: [facility];  Service: Orthopedics;  Laterality: Left;   VIDEO BRONCHOSCOPY WITH ENDOBRONCHIAL NAVIGATION N/A 02/25/2020   Procedure: VIDEO BRONCHOSCOPY WITH ENDOBRONCHIAL NAVIGATION with biopsies;  Surgeon: Collene Gobble, MD;  Location: [facility];  Service: Thoracic;  Laterality: N/A;   VIDEO BRONCHOSCOPY WITH ENDOBRONCHIAL ULTRASOUND N/A 02/25/2020   Procedure: VIDEO BRONCHOSCOPY WITH ENDOBRONCHIAL ULTRASOUND;  Surgeon: Collene Gobble, MD;  [facility];  Service: Thoracic;  Laterality: N/A;    REVIEW OF SYSTEMS:  A comprehensive review of systems was negative.   PHYSICAL EXAMINATION: General appearance: alert, cooperative, and no distress Head: Normocephalic, without obvious abnormality, atraumatic Neck: no adenopathy, no JVD, supple, symmetrical, trachea midline, and thyroid not enlarged, symmetric, no tenderness/mass/nodules Lymph nodes: Cervical, supraclavicular, and axillary nodes normal. Resp: clear to auscultation bilaterally Back: symmetric, no curvature. ROM normal. No CVA tenderness. Cardio: regular rate and rhythm, S1, S2 normal, no murmur, click, rub or gallop GI: soft, non-tender; bowel sounds normal; no masses,  no organomegaly Extremities: extremities normal, atraumatic, no cyanosis or edema  ECOG PERFORMANCE STATUS: 1 - Symptomatic but completely ambulatory  Blood pressure (!) 143/91, pulse 78, temperature 97.8 F (36.6 C), temperature source Tympanic, resp. rate 18, height 6\' 2"  (1.88 m), weight 190 lb 9.6 oz (86.5 kg), SpO2 100 %.  LABORATORY DATA: Lab Results  Component Value Date   WBC 3.7 (L) 06/29/2021   HGB 14.4 06/29/2021   HCT 41.9 06/29/2021   MCV 86.4 06/29/2021   PLT 173 06/29/2021      Chemistry      Component Value Date/Time   NA 140 06/08/2021 0858   K 3.3  (L) 06/08/2021 0858   CL 107 06/08/2021 0858   CO2 22 06/08/2021 0858   BUN 15 06/08/2021 0858   CREATININE 1.54 (H) 06/08/2021 0858      Component Value Date/Time   CALCIUM 9.7 06/08/2021 0858   ALKPHOS 106 06/08/2021 0858   AST 16 06/08/2021 0858   ALT 15 06/08/2021 0858   BILITOT 0.5 06/08/2021 0858       RADIOGRAPHIC STUDIES: No results found.   ASSESSMENT AND PLAN: This is a very pleasant 65 years old African-American male diagnosed with stage IV non-small cell lung cancer, adenocarcinoma presented with right upper lobe nodule in addition to right hilar, infrahilar, subcarinal and right paratracheal adenopathy in  addition to subcutaneous nodules in the left axilla and 2 small metastatic brain lesion diagnosed in August 2021.  The patient has no actionable mutations. He is currently undergoing systemic chemotherapy with carboplatin for AUC of 5, Alimta 500 mg/M2 and Keytruda 200 mg IV every 3 weeks.  Status post 21 cycles.  Starting from cycle #5 he will be on maintenance treatment with Alimta and Keytruda every 3 weeks.  I reduce the dose of his Alimta to 400 mg/M2 secondary to the renal insufficiency. The patient is currently on treatment with single agent Keytruda and Alimta was discontinued secondary to worsening renal insufficiency. He also underwent SBRT to the enlarging right upper lobe lung mass under the care of Dr. [name]. He continues to tolerate his treatment with Beryle Flock fairly well with no significant adverse effects. I recommended for him to proceed with cycle #22 today as planned. I will see him back for follow-up visit in 3 weeks for evaluation with repeat CT scan of the chest, abdomen and pelvis for restaging of his disease. The patient was advised to call immediately if he has any other concerning symptoms in the interval. The patient voices understanding of current disease status and treatment options and is in agreement with the current care plan.  All questions  were answered. The patient knows to call the clinic with any problems, questions or concerns. We can certainly see the patient much sooner if necessary.   Disclaimer: This note was dictated with voice recognition software. Similar sounding words can inadvertently be transcribed and may not be corrected upon review.

## 2021-06-29 NOTE — Progress Notes (Signed)
[facility] CSW Progress Notes  Met w patient in infusion at request of A Nature conservation officer.  Patient distressed about being short for this month's rent due to medical expense being deducted from his normal [facility] check.  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 He has been to [employer], [employer] and several other community agencies - none are able to assist him at this time.  CSW provided 4th/final disbursement of [employer], no other resources are readily available to meet his need.  Encouraged him to contact [employer] and [place] to explore their ability to help him.  Edwyna Shell, LCSW Clinical Social Worker Phone:  [phone]

## 2021-07-05 ENCOUNTER — Other Ambulatory Visit: Payer: Self-pay

## 2021-07-05 ENCOUNTER — Ambulatory Visit (HOSPITAL_COMMUNITY)
Admission: RE | Admit: 2021-07-05 | Discharge: 2021-07-05 | Disposition: A | Discharge status: Home / Self Care | Payer: No Typology Code available for payment source | Source: Ambulatory Visit | Attending: Internal Medicine | Admitting: Internal Medicine

## 2021-07-05 DIAGNOSIS — C3490 Malignant neoplasm of unspecified part of unspecified bronchus or lung: Secondary | ICD-10-CM | POA: Insufficient documentation

## 2021-07-05 IMAGING — CT CT ABD-PELV W/ CM
3 of 5 series · 14 of 36 positions shown, 16 images · IV contrast (OMNIPAQUE)
Comparison: Most recent CT chest, abdomen and pelvis [DATE].

CLINICAL DATA: Primary Cancer Type: Lung
TECHNIQUE: Multidetector CT imaging of the chest, abdomen and pelvis was
performed following the standard protocol during bolus
administration of intravenous contrast.

CONTRAST:  60mL OMNIPAQUE IOHEXOL 350 MG/ML SOLN

[Series 2: cap with · axial · 0.86mm/px · z∈[-565,-60]mm · 8 of 131 slices shown, 10 images]
[im 15/131  mediastinal]
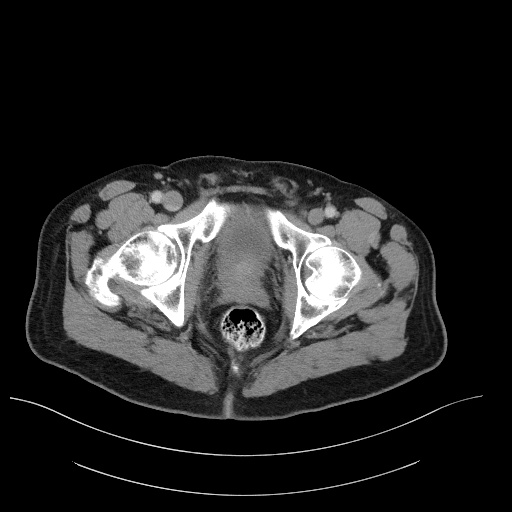
[im 15/131  lung]
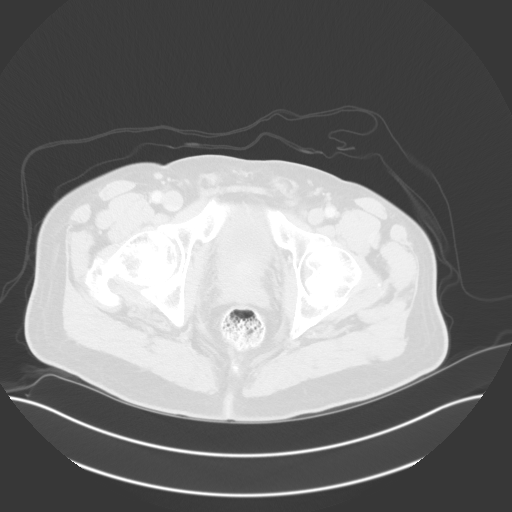
[im 29/131  lung]
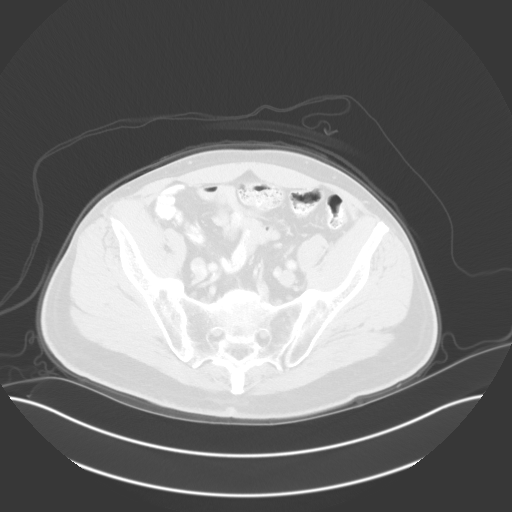
[im 44/131  lung]
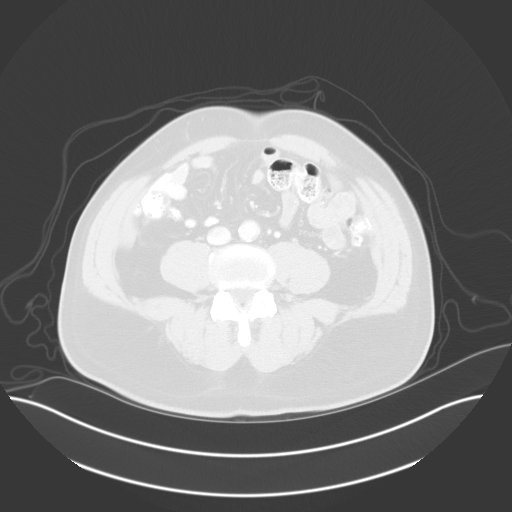
[im 58/131  lung]
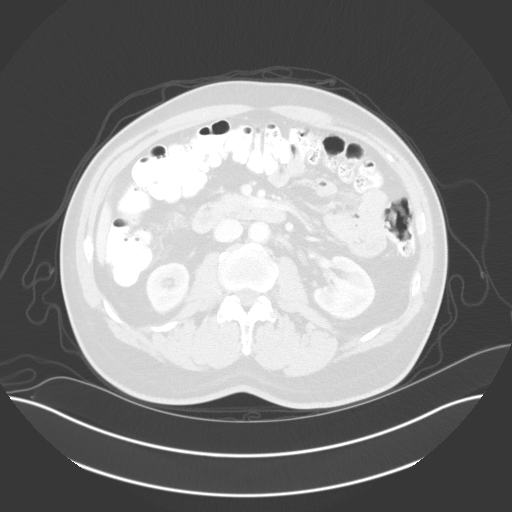
[im 73/131  mediastinal]
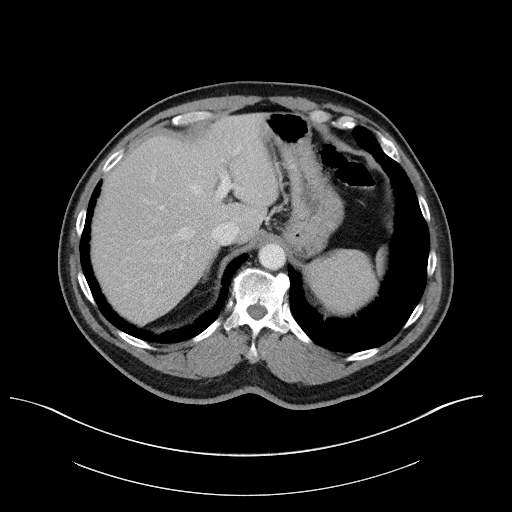
[im 73/131  lung]
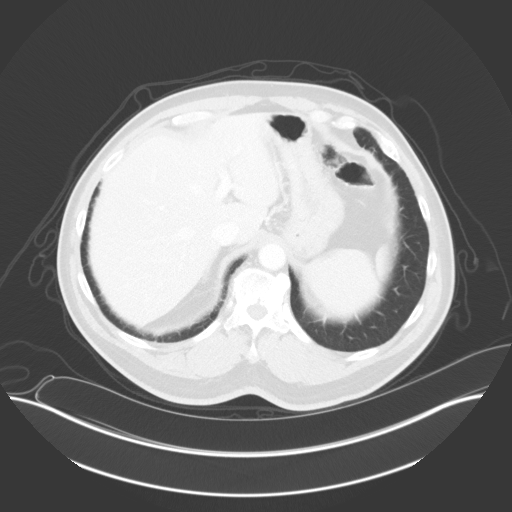
[im 87/131  lung]
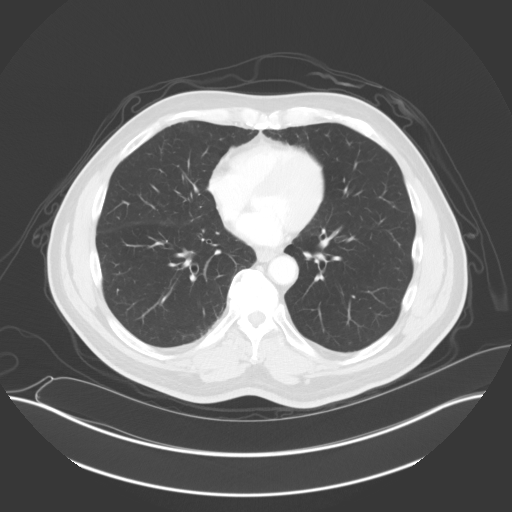
[im 102/131  lung]
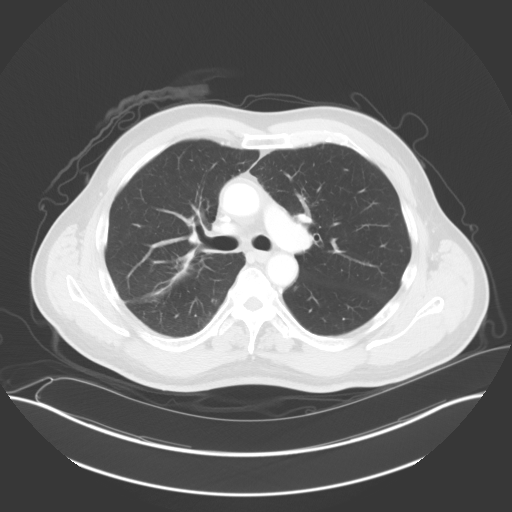
[im 116/131  lung]
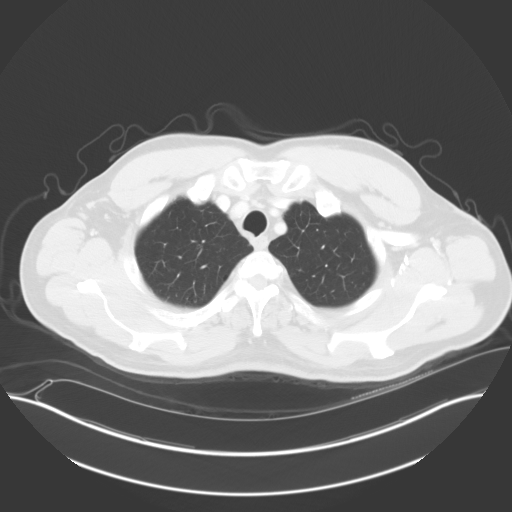

[Series 4: lung · axial · 0.86mm/px · z∈[-327,-223]mm · 3 of 185 slices shown]
[im 14/185  lung]
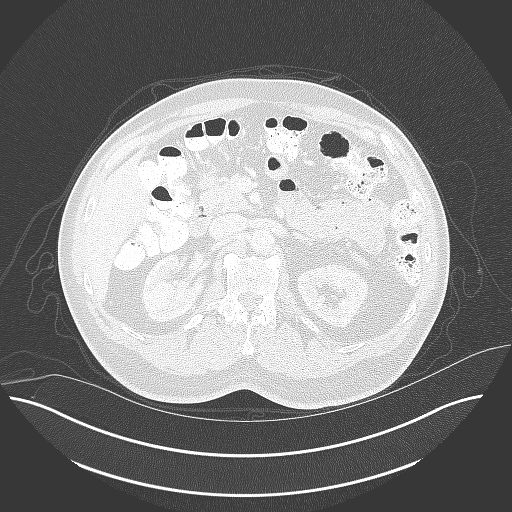
[im 40/185  lung]
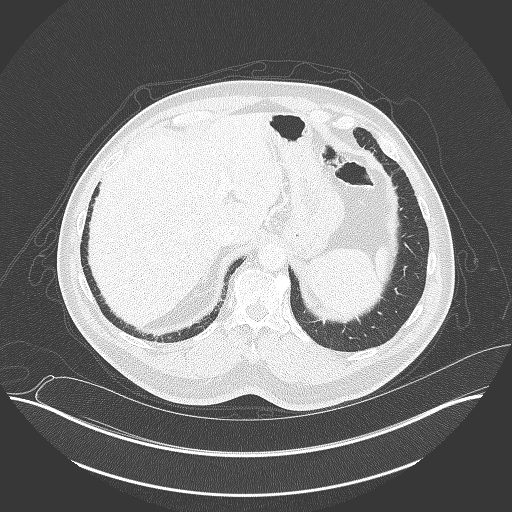
[im 66/185  lung]
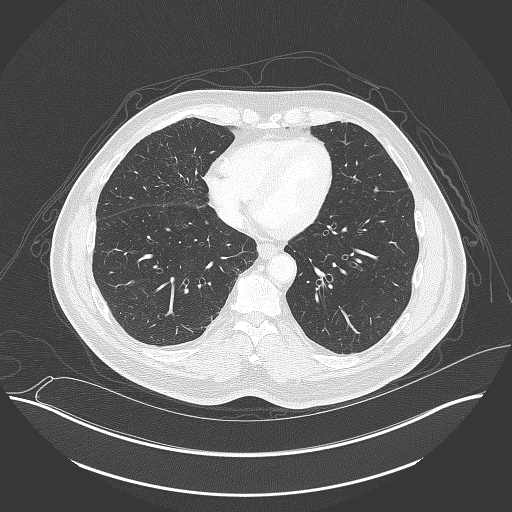

[Series 5: coronals · coronal · 0.82mm/px · 3 of 137 slices shown]
[im 28/137  lung]
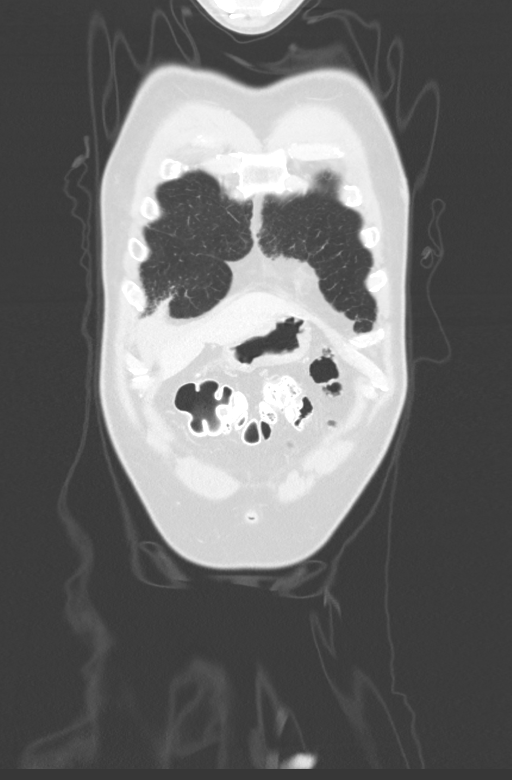
[im 55/137  lung]
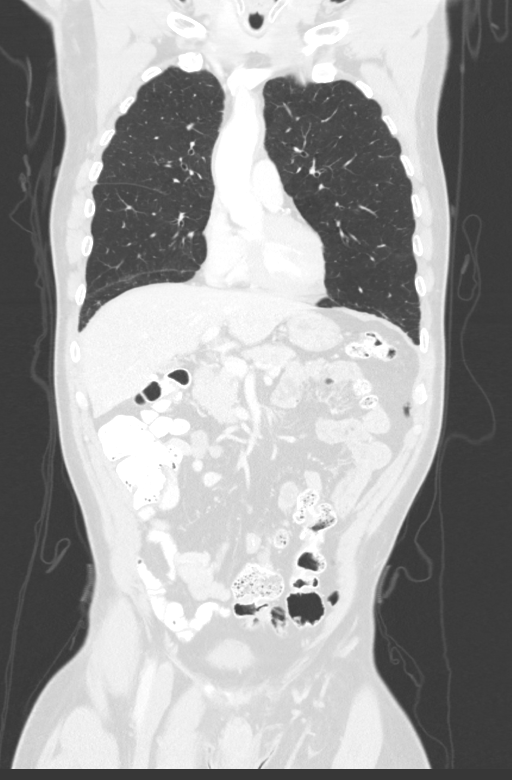
[im 82/137  lung]
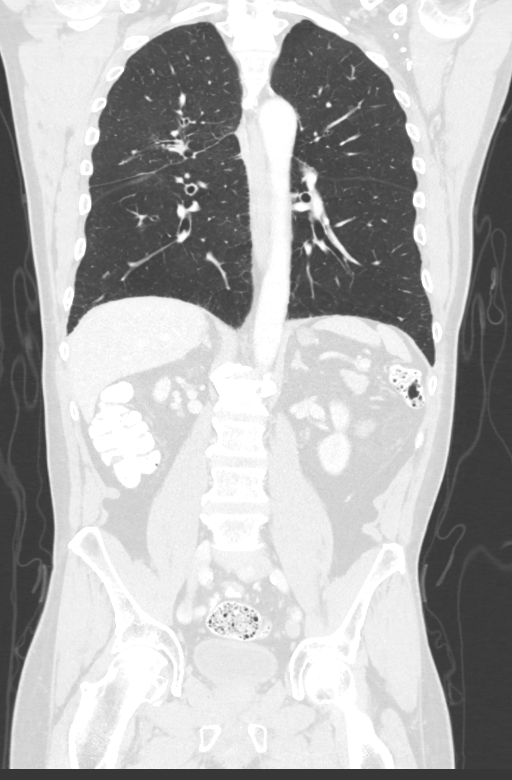

[14 of 36 positions shown; findings below may reference images not displayed]

Imaging Indication: Assess response to therapy

Interval therapy since last imaging? Yes

Initial Cancer Diagnosis

Date: [DATE]; Established by: Presumed

Detailed Pathology: Stage IV non-small cell lung cancer, favored to
be adenocarcinoma.

Primary Tumor location:  Right upper lobe. Metastases to brain.

Recurrence? Yes; Date(s) of recurrence: [DATE]; Established by:
Imaging only

Surgeries: No.

Chemotherapy: Yes; Ongoing? No; Most recent administration:
[DATE]

Immunotherapy?  Yes; Type: Keytruda; Ongoing? Yes

Radiation therapy? Yes

Date Range: [DATE] - [DATE]; Target: Right lung

Date Range: [DATE]; Target: Brain

EXAM:
CT CHEST, ABDOMEN, AND PELVIS WITH CONTRAST
FINDINGS: CT CHEST FINDINGS

Cardiovascular: Atherosclerotic calcification of the aorta and
coronary arteries. Heart size normal. No pericardial effusion.

Mediastinum/Nodes: 12 mm low-attenuation left thyroid nodule. No
follow-up recommended. (Ref: [HOSPITAL]. [DATE]):
143-50).No pathologically enlarged mediastinal, hilar or axillary
lymph nodes. Esophagus is grossly unremarkable.

Lungs/Pleura: Centrilobular emphysema. Posterior segment right upper
lobe mass has decreased in size, now measuring 1.6 x 2.9 cm (4/69)
previously 3.1 x 4.9 cm. Mild surrounding architectural distortion
and bronchiectasis, likely treatment related. 6 mm left upper lobe
nodule (4/62), unchanged. There may be mild basilar subpleural
reticulation and traction bronchiolectasis. No pleural fluid. Debris
is seen in the airway.

Musculoskeletal: Degenerative changes in the spine. No worrisome
lytic or sclerotic lesions.

CT ABDOMEN PELVIS FINDINGS

Hepatobiliary: Liver and gallbladder are unremarkable. No biliary
ductal dilatation.

Pancreas: Negative.

Spleen: Negative.

Adrenals/Urinary Tract: Adrenal glands are unremarkable.
Subcentimeter low-attenuation lesion in the upper pole right kidney,
too small to characterize. Kidneys are otherwise unremarkable.
Ureters are decompressed. There may be slight bladder wall
thickening.

Stomach/Bowel: Stomach, small bowel, appendix and colon are
unremarkable.

Vascular/Lymphatic: Atherosclerotic calcification of the aorta. No
pathologically enlarged lymph nodes.

Reproductive: Prostate is minimally prominent.

Other: No free fluid.  Mesenteries and peritoneum are unremarkable.

Musculoskeletal: Degenerative changes in the spine. No worrisome
lytic or sclerotic lesions.
IMPRESSION: 1. Interval response to therapy as evidenced by decrease in size a
mass in the posterior segment right upper lobe. No definitive
evidence of metastatic disease in the chest, abdomen or pelvis.
2. 6 mm left upper lobe nodule, stable.
3. Question mild basilar subpleural pulmonary fibrosis. If further
evaluation is desired, high-resolution chest CT without contrast is
recommended.
4. Prostate may be minimally prominent. Question slight bladder wall
thickening, indicative of an element of outlet obstruction.
5. Aortic atherosclerosis ([EP]-[EP]). Coronary artery
calcification.
6.  Emphysema ([EP]-[EP]).

## 2021-07-05 MED ORDER — IOHEXOL 350 MG/ML SOLN
60.0000 mL | Freq: Once | INTRAVENOUS | Status: AC | PRN
  Administered 2021-07-05: 60 mL via INTRAVENOUS

## 2021-07-05 MED ORDER — SODIUM CHLORIDE (PF) 0.9 % IJ SOLN
INTRAMUSCULAR | Status: AC
  Filled 2021-07-05: qty 50

## 2021-07-07 ENCOUNTER — Inpatient Hospital Stay: Admission: RE | Admit: 2021-07-07 | Payer: No Typology Code available for payment source | Source: Ambulatory Visit

## 2021-07-10 ENCOUNTER — Other Ambulatory Visit: Payer: Self-pay

## 2021-07-13 NOTE — Progress Notes (Signed)
[place] [address]  DIAGNOSIS: Stage IV non-small cell lung cancer, favored to be adenocarcinoma.  He presented with posterior right upper lobe nodule as well as right hilar, infrahilar, subcarinal, and right paratracheal lymphadenopathy.  He also has a 1.4 cm x 1 cm soft tissue nodule in the skin/subcutaneous fat in the left axilla.  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  He was diagnosed in August 2021   Molecular Biomarkers: BIOMARKER(S)         % CFDNA OR AMPLIFICATION       ASSOCIATED FDA-APPROVED THERAPIES        CLINICAL TRIAL AVAILABILITY [id number] 2.9% None    Yes [id number] 0.4% None    Yes [id number] 0.2% None    Yes  PRIOR THERAPY: 1) SRS to the small metastatic brain lesion under the care of Dr. [name] on 03/18/20 2) Radiation to the enlarging right upper lobe lung mass under the care of Dr. [name]. Last dose on 04/06/21.   CURRENT THERAPY:  Palliative systemic chemotherapy with carboplatin for an AUC of 5, Alimta 500 mg/m2, and Keytruda 200 mg IV every 3 weeks. First dose expected on 04/07/20. Status post 22 cycles. Starting from cycle #5 he will be on maintenance treatment with Alimta and Keytruda every 3 weeks. His dose of Alimta was reduced to 400 mg/m2. Alimta removed starting from cycle #11 due to renal insuffiencey.     INTERVAL HISTORY: Randy Andersen 65 y.o. male is here today for follow-up of his lung cancer.  He continues on [place] immunotherapy every 3 weeks.  He tolerates this well.  He denies any new symptoms such as shortness of breath cough chest pain palpitations skin changes fatigue diarrhea or any new concerns.  He underwent CT scan of the chest with contrast on July 05, 2021.  This showed decrease in his right upper lobe mass, 6 mm stable left upper lobe nodule, questionable for pulmonary fibrosis, prominence of the  prostate, aortic atherosclerosis, coronary artery calcification, and emphysema.  MEDICAL HISTORY: Past Medical History:  Diagnosis Date   Asthma    as a child   Chronic pain disorder    LT leg and foot   Heart murmur    as a child   Hypertension    Malignant neoplasm of upper lobe of right lung ([place])    Stroke ([place])    mini stroke - 2015, some tingling in fingers on right     ALLERGIES:  has No Known Allergies.  MEDICATIONS:  Current Outpatient Medications  Medication Sig Dispense Refill   amLODipine (NORVASC) 10 MG tablet Take 10 mg by mouth daily.     Cholecalciferol 100 MCG (4000 UT) TABS Take 2 tablets by mouth daily.     doxylamine, Sleep, (UNISOM) 25 MG tablet Take 25 mg by mouth at bedtime as needed for sleep.     HYDROcodone-acetaminophen (NORCO) 10-325 MG tablet Take 1 tablet by mouth 2 (two) times daily.     oxybutynin (DITROPAN-XL) 10 MG 24 hr tablet Take by mouth.     phenazopyridine (PYRIDIUM) 200 MG tablet Take 1 tablet (200 mg total) by mouth 3 (three) times daily. 6 tablet 0   polyethylene glycol powder (GLYCOLAX/MIRALAX) 17 GM/SCOOP powder      prochlorperazine (COMPAZINE) 10 MG tablet Take 1 tablet (10 mg total) by mouth every 6 (six) hours as needed. 30 tablet 2  rivaroxaban (XARELTO) 20 MG TABS tablet TAKE 1 TABLET BY MOUTH ONCE A DAY WITH SUPPER 30 tablet 2   senna-docusate (SENOKOT-S) 8.6-50 MG tablet Take 2 tablets by mouth daily.     tadalafil (CIALIS) 20 MG tablet Take by mouth.     tamsulosin (FLOMAX) 0.4 MG CAPS capsule Take 1 capsule (0.4 mg total) by mouth daily. 30 capsule 2   temazepam (RESTORIL) 15 MG capsule Take 1 capsule (15 mg total) by mouth at bedtime as needed for sleep. Pt is veteran - Call pt to set up delivery 30 capsule 0   No current facility-administered medications for this visit.    SURGICAL HISTORY:  Past Surgical History:  Procedure Laterality Date   BUNIONECTOMY Left    had hammer toe surgery also and callus removed    BUNIONECTOMY Right    also had hammer toe surgery and callus removed   LEG SURGERY Left    PT reports he has pins placed in his Lt leg   ORIF TIBIA PLATEAU  10/09/2012   Dr Lorin Mercy   ORIF TIBIA PLATEAU Left 10/08/2012   Procedure: OPEN REDUCTION INTERNAL FIXATION (ORIF) TIBIAL PLATEAU;  Surgeon: Marybelle Killings, MD;  Location: [facility];  Service: Orthopedics;  Laterality: Left;   VIDEO BRONCHOSCOPY WITH ENDOBRONCHIAL NAVIGATION N/A 02/25/2020   Procedure: VIDEO BRONCHOSCOPY WITH ENDOBRONCHIAL NAVIGATION with biopsies;  Surgeon: Collene Gobble, MD;  Location: [facility];  Service: Thoracic;  Laterality: N/A;   VIDEO BRONCHOSCOPY WITH ENDOBRONCHIAL ULTRASOUND N/A 02/25/2020   Procedure: VIDEO BRONCHOSCOPY WITH ENDOBRONCHIAL ULTRASOUND;  Surgeon: Collene Gobble, MD;  Location: [facility];  Service: Thoracic;  Laterality: N/A;    REVIEW OF SYSTEMS:   Review of Systems  Constitutional:  Negative for appetite change, chills, fatigue, fever and unexpected weight change.  HENT:   Negative for hearing loss, lump/mass and trouble swallowing.   Eyes:  Negative for eye problems and icterus.  Respiratory:  Negative for chest tightness, cough and shortness of breath.   Cardiovascular:  Negative for chest pain, leg swelling and palpitations.  Gastrointestinal:  Negative for abdominal distention, abdominal pain, constipation, diarrhea, nausea and vomiting.  Endocrine: Negative for hot flashes.  Genitourinary:  Negative for difficulty urinating.   Musculoskeletal:  Negative for arthralgias.  Skin:  Negative for itching and rash.  Neurological:  Negative for dizziness, extremity weakness, headaches and numbness.  Hematological:  Negative for adenopathy. Does not bruise/bleed easily.  Psychiatric/Behavioral:  Negative for depression. The patient is not nervous/anxious.      PHYSICAL EXAMINATION:  Blood pressure (!) 151/92, pulse 85, temperature 99 F (37.2 C), temperature source Oral, resp. rate 18, weight 189 lb (85.7  kg), SpO2 98 %.  ECOG PERFORMANCE STATUS: 0 - Asymptomatic  Physical Exam  Constitutional: Oriented to person, place, and time and well-developed, well-nourished, and in no distress. No distress.  HENT:  Head: Normocephalic and atraumatic.  Mouth/Throat: Oropharynx is clear and moist. No oropharyngeal exudate.  Eyes: Conjunctivae are normal. Right eye exhibits no discharge. Left eye exhibits no discharge. No scleral icterus.  Neck: Normal range of motion. Neck supple.  Cardiovascular: Normal rate, regular rhythm, normal heart sounds and intact distal pulses.   Pulmonary/Chest: Effort normal and breath sounds normal. No respiratory distress. No wheezes. No rales.  Abdominal: Soft. Bowel sounds are normal. Exhibits no distension and no mass. There is no tenderness.  Musculoskeletal: Normal range of motion. Exhibits no edema.  Lymphadenopathy:    No cervical adenopathy.  Neurological: Alert and oriented  to person, place, and time. Exhibits normal muscle tone. Gait normal. Coordination normal.  Skin: Skin is warm and dry. No rash noted. Not diaphoretic. No erythema. No pallor.  Psychiatric: Mood, memory and judgment normal.  Vitals reviewed.  LABORATORY DATA: Lab Results  Component Value Date   WBC 4.4 07/20/2021   HGB 15.0 07/20/2021   HCT 44.8 07/20/2021   MCV 87.5 07/20/2021   PLT 168 07/20/2021      Chemistry      Component Value Date/Time   NA 140 07/20/2021 1317   K 4.2 07/20/2021 1317   CL 107 07/20/2021 1317   CO2 25 07/20/2021 1317   BUN 19 07/20/2021 1317   CREATININE 1.74 (H) 07/20/2021 1317      Component Value Date/Time   CALCIUM 9.7 07/20/2021 1317   ALKPHOS 96 07/20/2021 1317   AST 14 (L) 07/20/2021 1317   ALT 10 07/20/2021 1317   BILITOT 0.5 07/20/2021 1317       RADIOGRAPHIC STUDIES:  CT Chest W Contrast  Result Date: 07/06/2021 CLINICAL DATA:  Primary Cancer Type: Lung Imaging Indication: Assess response to therapy Interval therapy since last  imaging? Yes Initial Cancer Diagnosis Date: 02/25/2020; Established by: Presumed Detailed Pathology: Stage IV non-small cell lung cancer, favored to be adenocarcinoma. 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 Recurrence? Yes; Date(s) of recurrence: 02/08/2021; Established by: Imaging only Surgeries: No. Chemotherapy: Yes; Ongoing? No; Most recent administration: 11/10/2020 Immunotherapy?  Yes; Type: Keytruda; Ongoing? Yes Radiation therapy? Yes Date Range: 03/23/2021 - 04/06/2021; Target: Right lung Date Range: 03/18/2020; Target: Brain EXAM: CT CHEST, ABDOMEN, AND PELVIS WITH CONTRAST TECHNIQUE: Multidetector CT imaging of the chest, abdomen and pelvis was performed following the standard protocol during bolus administration of intravenous contrast. CONTRAST:  25mL OMNIPAQUE IOHEXOL 350 MG/ML SOLN COMPARISON:  Most recent CT chest, abdomen and pelvis 04/26/2021. FINDINGS: CT CHEST FINDINGS Cardiovascular: Atherosclerotic calcification of the aorta and coronary arteries. Heart size normal. No pericardial effusion. Mediastinum/Nodes: 12 mm low-attenuation left thyroid nodule. No follow-up recommended. (Ref: J Am Coll Radiol. 2015 Feb;12(2): 143-50).No pathologically enlarged mediastinal, hilar or axillary lymph nodes. Esophagus is grossly unremarkable. Lungs/Pleura: Centrilobular emphysema. Posterior segment right upper lobe mass has decreased in size, now measuring 1.6 x 2.9 cm (4/69) previously 3.1 x 4.9 cm. Mild surrounding architectural distortion and bronchiectasis, likely treatment related. 6 mm left upper lobe nodule (4/62), unchanged. There may be mild basilar subpleural reticulation and traction bronchiolectasis. No pleural fluid. Debris is seen in the airway. Musculoskeletal: Degenerative changes in the spine. No worrisome lytic or sclerotic lesions. CT ABDOMEN PELVIS FINDINGS Hepatobiliary: Liver and gallbladder are unremarkable. No biliary ductal dilatation. Pancreas: Negative.  Spleen: Negative. Adrenals/Urinary Tract: Adrenal glands are unremarkable. Subcentimeter low-attenuation lesion in the upper pole right kidney, too small to characterize. Kidneys are otherwise unremarkable. Ureters are decompressed. There may be slight bladder wall thickening. Stomach/Bowel: Stomach, small bowel, appendix and colon are unremarkable. Vascular/Lymphatic: Atherosclerotic calcification of the aorta. No pathologically enlarged lymph nodes. Reproductive: Prostate is minimally prominent. Other: No free fluid.  Mesenteries and peritoneum are unremarkable. Musculoskeletal: Degenerative changes in the spine. No worrisome lytic or sclerotic lesions. IMPRESSION: 1. Interval response to therapy as evidenced by decrease in size a mass in the posterior segment right upper lobe. No definitive evidence of metastatic disease in the chest, abdomen or pelvis. 2. 6 mm left upper lobe nodule, stable. 3. Question mild basilar subpleural pulmonary fibrosis. If further evaluation is desired, high-resolution chest CT without contrast  is recommended. 4. Prostate may be minimally prominent. Question slight bladder wall thickening, indicative of an element of outlet obstruction. 5. Aortic atherosclerosis (ICD10-I70.0). Coronary artery calcification. 6.  Emphysema (ICD10-J43.9). Electronically Signed   By: Lorin Picket M.D.   On: 07/06/2021 10:56   CT Abdomen Pelvis W Contrast  Result Date: 07/06/2021 CLINICAL DATA:  Primary Cancer Type: Lung Imaging Indication: Assess response to therapy Interval therapy since last imaging? Yes Initial Cancer Diagnosis Date: 02/25/2020; Established by: Presumed Detailed Pathology: Stage IV non-small cell lung cancer, favored to be adenocarcinoma. 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 Recurrence? Yes; Date(s) of recurrence: 02/08/2021; Established by: Imaging only Surgeries: No. Chemotherapy: Yes; Ongoing? No; Most recent administration: 11/10/2020  Immunotherapy?  Yes; Type: Keytruda; Ongoing? Yes Radiation therapy? Yes Date Range: 03/23/2021 - 04/06/2021; Target: Right lung Date Range: 03/18/2020; Target: Brain EXAM: CT CHEST, ABDOMEN, AND PELVIS WITH CONTRAST TECHNIQUE: Multidetector CT imaging of the chest, abdomen and pelvis was performed following the standard protocol during bolus administration of intravenous contrast. CONTRAST:  31mL OMNIPAQUE IOHEXOL 350 MG/ML SOLN COMPARISON:  Most recent CT chest, abdomen and pelvis 04/26/2021. FINDINGS: CT CHEST FINDINGS Cardiovascular: Atherosclerotic calcification of the aorta and coronary arteries. Heart size normal. No pericardial effusion. Mediastinum/Nodes: 12 mm low-attenuation left thyroid nodule. No follow-up recommended. (Ref: J Am Coll Radiol. 2015 Feb;12(2): 143-50).No pathologically enlarged mediastinal, hilar or axillary lymph nodes. Esophagus is grossly unremarkable. Lungs/Pleura: Centrilobular emphysema. Posterior segment right upper lobe mass has decreased in size, now measuring 1.6 x 2.9 cm (4/69) previously 3.1 x 4.9 cm. Mild surrounding architectural distortion and bronchiectasis, likely treatment related. 6 mm left upper lobe nodule (4/62), unchanged. There may be mild basilar subpleural reticulation and traction bronchiolectasis. No pleural fluid. Debris is seen in the airway. Musculoskeletal: Degenerative changes in the spine. No worrisome lytic or sclerotic lesions. CT ABDOMEN PELVIS FINDINGS Hepatobiliary: Liver and gallbladder are unremarkable. No biliary ductal dilatation. Pancreas: Negative. Spleen: Negative. Adrenals/Urinary Tract: Adrenal glands are unremarkable. Subcentimeter low-attenuation lesion in the upper pole right kidney, too small to characterize. Kidneys are otherwise unremarkable. Ureters are decompressed. There may be slight bladder wall thickening. Stomach/Bowel: Stomach, small bowel, appendix and colon are unremarkable. Vascular/Lymphatic: Atherosclerotic calcification  of the aorta. No pathologically enlarged lymph nodes. Reproductive: Prostate is minimally prominent. Other: No free fluid.  Mesenteries and peritoneum are unremarkable. Musculoskeletal: Degenerative changes in the spine. No worrisome lytic or sclerotic lesions. IMPRESSION: 1. Interval response to therapy as evidenced by decrease in size a mass in the posterior segment right upper lobe. No definitive evidence of metastatic disease in the chest, abdomen or pelvis. 2. 6 mm left upper lobe nodule, stable. 3. Question mild basilar subpleural pulmonary fibrosis. If further evaluation is desired, high-resolution chest CT without contrast is recommended. 4. Prostate may be minimally prominent. Question slight bladder wall thickening, indicative of an element of outlet obstruction. 5. Aortic atherosclerosis (ICD10-I70.0). Coronary artery calcification. 6.  Emphysema (ICD10-J43.9). Electronically Signed   By: Lorin Picket M.D.   On: 07/06/2021 10:56     ASSESSMENT:  This is a very pleasant 65 year old African-American male with stage IV non-small cell lung cancer, favored to be adenocarcinoma.  He presented with posterior right upper lobe nodule as well as right hilar, infrahilar, subcarinal, and right paratracheal lymphadenopathy.  He also has a 1.4 cm x 1 cm soft tissue nodule in the skin/subcutaneous fat in the left axilla.  He also has 2 small metastatic lesions measuring 4 mm in the  brain.  He was diagnosed in August 2021.  Molecular studies by [facility] are negative for any actionable mutations.   (1)The patient completed SRS to the small metastatic brain lesion under the care of Dr. [name] on 03/18/20   (2)The patient is currently undergoing palliative systemic chemotherapy with carboplatin for an AUC of 5, Alimta 500 mg per metered squared, and Keytruda 200 mg IV every 3 weeks.  He is status post 22 cycles. The dose of alimta was reduced to 400 mg/m2 due to renal insufficiency. Alimta was ultimately  removed from the treatment plan with cycle #11 due to renal insufficiency. He continues on [place] alone.  (3) The patient completed SBRT to the enlarging right upper lobe lung mass under the care of Dr. [name] on 04/06/21.   PLAN:   Samvel is here today for follow-up of his lung cancer.  He has no sign of difficulty and tolerating the [facility], he also has no signs of progression of his lung cancer.  He will proceed with treatment today.  We discussed his CT scans in detail.  Prior to me reviewing them with him I spoke with Dr. Chase Caller and pulmonology about the scans.  We discussed the fact that pulmonary fibrosis and pneumonitis are terms that can be used interchangeably.  Dr. Chase Caller had compared the scans in [place]'s case and recommended a high-resolution CT scan supine and prone in 2 months time so long as he is not symptomatic which he is not .  This would more definitively evaluate this questionable area noted on the imaging.  He can proceed with the [facility].   I reviewed the above with Yasiel who is in agreement with this.  He also has follow-up later this week with Dr. Mickeal Skinner which I encouraged him to keep.  Lyell requested that I refill his Unisom which I did.  Mortimer Fries will return in 3 weeks for labs, follow-up with Dr. Earlie Server, and his next [facility].She knows to call for any questions that may arise between now and her next appointment.  We are happy to see her sooner if needed.  I discussed the above case and recommendations with [name] Heilingoetter.      Total encounter time: 30 minutes in face-to-face visit time, chart review, lab review, care coordination, discussion with other providers, and documentation of the encounter.  Wilber Bihari, NP 07/20/21 2:28 PM Medical Oncology and Hematology [facility] [place], [place] [address] Tel. [phone]    Fax. [phone]  *Total Encounter Time as defined by the Centers for Medicare and Medicaid  Services includes, in addition to the face-to-face time of a patient visit (documented in the note above) non-face-to-face time: obtaining and reviewing outside history, ordering and reviewing medications, tests or procedures, care coordination (communications with other health care professionals or caregivers) and documentation in the medical record.

## 2021-07-14 ENCOUNTER — Ambulatory Visit: Payer: No Typology Code available for payment source | Admitting: Internal Medicine

## 2021-07-19 ENCOUNTER — Other Ambulatory Visit: Payer: Self-pay | Admitting: Medical Oncology

## 2021-07-19 ENCOUNTER — Ambulatory Visit (HOSPITAL_COMMUNITY): Payer: No Typology Code available for payment source

## 2021-07-19 DIAGNOSIS — C3411 Malignant neoplasm of upper lobe, right bronchus or lung: Secondary | ICD-10-CM

## 2021-07-20 ENCOUNTER — Inpatient Hospital Stay (HOSPITAL_BASED_OUTPATIENT_CLINIC_OR_DEPARTMENT_OTHER): Payer: No Typology Code available for payment source | Admitting: Adult Health

## 2021-07-20 ENCOUNTER — Inpatient Hospital Stay: Payer: No Typology Code available for payment source

## 2021-07-20 ENCOUNTER — Encounter: Payer: Self-pay | Admitting: Adult Health

## 2021-07-20 ENCOUNTER — Other Ambulatory Visit (HOSPITAL_COMMUNITY): Payer: Self-pay

## 2021-07-20 ENCOUNTER — Other Ambulatory Visit: Payer: Self-pay

## 2021-07-20 VITALS — BP 151/92 | HR 85 | Temp 99.0°F | Resp 18 | Wt 189.0 lb

## 2021-07-20 DIAGNOSIS — Z5112 Encounter for antineoplastic immunotherapy: Secondary | ICD-10-CM | POA: Diagnosis not present

## 2021-07-20 DIAGNOSIS — C7931 Secondary malignant neoplasm of brain: Secondary | ICD-10-CM | POA: Diagnosis not present

## 2021-07-20 DIAGNOSIS — C3411 Malignant neoplasm of upper lobe, right bronchus or lung: Secondary | ICD-10-CM

## 2021-07-20 LAB — CMP (CANCER CENTER ONLY)
ALT: 10 U/L (ref 0–44)
AST: 14 U/L — ABNORMAL LOW (ref 15–41)
Albumin: 4.1 g/dL (ref 3.5–5.0)
Alkaline Phosphatase: 96 U/L (ref 38–126)
Anion gap: 8 (ref 5–15)
BUN: 19 mg/dL (ref 8–23)
CO2: 25 mmol/L (ref 22–32)
Calcium: 9.7 mg/dL (ref 8.9–10.3)
Chloride: 107 mmol/L (ref 98–111)
Creatinine: 1.74 mg/dL — ABNORMAL HIGH (ref 0.61–1.24)
GFR, Estimated: 43 mL/min — ABNORMAL LOW (ref >60)
Glucose, Bld: 104 mg/dL — ABNORMAL HIGH (ref 70–99)
Potassium: 4.2 mmol/L (ref 3.5–5.1)
Sodium: 140 mmol/L (ref 135–145)
Total Bilirubin: 0.5 mg/dL (ref 0.3–1.2)
Total Protein: 7.7 g/dL (ref 6.5–8.1)

## 2021-07-20 LAB — CBC WITH DIFFERENTIAL (CANCER CENTER ONLY)
Abs Immature Granulocytes: 0.01 10*3/uL (ref 0.00–0.07)
Basophils Absolute: 0 10*3/uL (ref 0.0–0.1)
Basophils Relative: 1 %
Eosinophils Absolute: 0.2 10*3/uL (ref 0.0–0.5)
Eosinophils Relative: 4 %
HCT: 44.8 % (ref 39.0–52.0)
Hemoglobin: 15 g/dL (ref 13.0–17.0)
Immature Granulocytes: 0 %
Lymphocytes Relative: 20 %
Lymphs Abs: 0.9 10*3/uL (ref 0.7–4.0)
MCH: 29.3 pg (ref 26.0–34.0)
MCHC: 33.5 g/dL (ref 30.0–36.0)
MCV: 87.5 fL (ref 80.0–100.0)
Monocytes Absolute: 0.6 10*3/uL (ref 0.1–1.0)
Monocytes Relative: 13 %
Neutro Abs: 2.7 10*3/uL (ref 1.7–7.7)
Neutrophils Relative %: 62 %
Platelet Count: 168 10*3/uL (ref 150–400)
RBC: 5.12 MIL/uL (ref 4.22–5.81)
RDW: 13.5 % (ref 11.5–15.5)
WBC Count: 4.4 10*3/uL (ref 4.0–10.5)
nRBC: 0 % (ref 0.0–0.2)

## 2021-07-20 LAB — TSH: TSH: 1.986 u[IU]/mL (ref 0.320–4.118)

## 2021-07-20 MED ORDER — SODIUM CHLORIDE 0.9 % IV SOLN: Freq: Once | INTRAVENOUS | Status: AC

## 2021-07-20 MED ORDER — SODIUM CHLORIDE 0.9 % IV SOLN
200.0000 mg | Freq: Once | INTRAVENOUS | Status: AC
  Administered 2021-07-20: 15:00:00 200 mg via INTRAVENOUS
  Filled 2021-07-20: qty 8

## 2021-07-20 MED ORDER — DOXYLAMINE SUCCINATE (SLEEP) 25 MG PO TABS
25.0000 mg | ORAL_TABLET | Freq: Every evening | ORAL | 0 refills | Status: DC | PRN
  Filled 2021-07-20: qty 30, 30d supply, fill #0

## 2021-07-20 NOTE — Progress Notes (Signed)
Per Wilber Bihari, NP - okay to treat with elevated creatinine.

## 2021-07-20 NOTE — Patient Instructions (Signed)
[facility]   Discharge Instructions: Thank you for choosing [facility] to provide your oncology and hematology care.   If you have a lab appointment with the [facility], please go directly to the [facility] and check in at the registration area.   Wear comfortable clothing and clothing appropriate for easy access to any Portacath or PICC line.   We strive to give you quality time with your provider. You may need to reschedule your appointment if you arrive late (15 or more minutes).  Arriving late affects you and other patients whose appointments are after yours.  Also, if you miss three or more appointments without notifying the office, you may be dismissed from the clinic at the providers discretion.      For prescription refill requests, have your pharmacy contact our office and allow 72 hours for refills to be completed.    Today you received the following chemotherapy and/or immunotherapy agents: Pembrolizumab Beryle Flock)      To help prevent nausea and vomiting after your treatment, we encourage you to take your nausea medication as directed.  BELOW ARE SYMPTOMS THAT SHOULD BE REPORTED IMMEDIATELY: *FEVER GREATER THAN 100.4 F (38 C) OR HIGHER *CHILLS OR SWEATING *NAUSEA AND VOMITING THAT IS NOT CONTROLLED WITH YOUR NAUSEA MEDICATION *UNUSUAL SHORTNESS OF BREATH *UNUSUAL BRUISING OR BLEEDING *URINARY PROBLEMS (pain or burning when urinating, or frequent urination) *BOWEL PROBLEMS (unusual diarrhea, constipation, pain near the anus) TENDERNESS IN MOUTH AND THROAT WITH OR WITHOUT PRESENCE OF ULCERS (sore throat, sores in mouth, or a toothache) UNUSUAL RASH, SWELLING OR PAIN  UNUSUAL VAGINAL DISCHARGE OR ITCHING   Items with * indicate a potential emergency and should be followed up as soon as possible or go to the Emergency Department if any problems should occur.  Please show the CHEMOTHERAPY ALERT CARD or IMMUNOTHERAPY ALERT  CARD at check-in to the Emergency Department and triage nurse.  Should you have questions after your visit or need to cancel or reschedule your appointment, please contact [facility]  Dept: [phone]  and follow the prompts.  Office hours are 8:00 a.m. to 4:30 p.m. Monday - Friday. Please note that voicemails left after 4:00 p.m. may not be returned until the following business day.  We are closed weekends and major holidays. You have access to a nurse at all times for urgent questions. Please call the main number to the clinic Dept: [phone] and follow the prompts.   For any non-urgent questions, you may also contact your provider using MyChart. We now offer e-Visits for anyone 47 and older to request care online for non-urgent symptoms. For details visit [email or web address].   Also download the MyChart app! Go to the app store, search "MyChart", open the app, select [facility], and log in with your MyChart username and password.  Due to Covid, a mask is required upon entering the hospital/clinic. If you do not have a mask, one will be given to you upon arrival. For doctor visits, patients may have 1 support person aged 75 or older with them. For treatment visits, patients cannot have anyone with them due to current Covid guidelines and our immunocompromised population.

## 2021-07-22 ENCOUNTER — Other Ambulatory Visit: Payer: No Typology Code available for payment source

## 2021-07-28 ENCOUNTER — Ambulatory Visit: Payer: No Typology Code available for payment source | Admitting: Internal Medicine

## 2021-08-03 ENCOUNTER — Ambulatory Visit
Admission: RE | Admit: 2021-08-03 | Discharge: 2021-08-03 | Disposition: A | Discharge status: Home / Self Care | Payer: No Typology Code available for payment source | Source: Ambulatory Visit | Attending: Internal Medicine | Admitting: Internal Medicine

## 2021-08-03 DIAGNOSIS — I6381 Other cerebral infarction due to occlusion or stenosis of small artery: Secondary | ICD-10-CM | POA: Diagnosis not present

## 2021-08-03 DIAGNOSIS — C3490 Malignant neoplasm of unspecified part of unspecified bronchus or lung: Secondary | ICD-10-CM | POA: Diagnosis not present

## 2021-08-03 DIAGNOSIS — C7931 Secondary malignant neoplasm of brain: Secondary | ICD-10-CM

## 2021-08-03 DIAGNOSIS — G939 Disorder of brain, unspecified: Secondary | ICD-10-CM | POA: Diagnosis not present

## 2021-08-03 IMAGING — MR MR HEAD WO/W CM
13 series · 48 of 48 positions shown · IV contrast (17ml multi)
Comparison: [DATE]

CLINICAL DATA: Lung cancer, brain metastases, assess treatment
response

EXAM:
MRI HEAD WITHOUT AND WITH CONTRAST
TECHNIQUE: Multiplanar, multiecho pulse sequences of the brain and surrounding
structures were obtained without and with intravenous contrast.
CONTRAST:  17mL MULTIHANCE GADOBENATE DIMEGLUMINE 529 MG/ML IV SOLN

[Series 2: FLAIR · sagittal · 3.0mm · 0.78mm/px · 2 of 39 slices shown (1 of 2)]
[im 1/39]
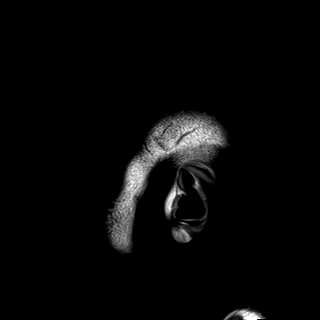
[im 39/39]
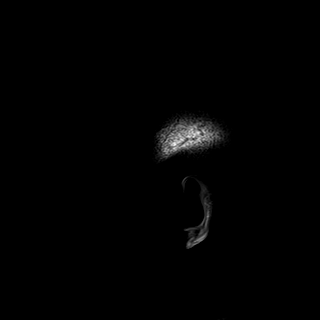

[Series 3: DWI · axial · 3.0mm · 1.50mm/px · z∈[-65,+83]mm · 4 of 78 slices shown (1 of 2)]
[im 1/78]
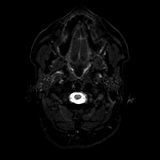
[im 26/78]
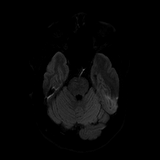
[im 52/78]
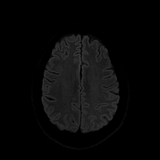
[im 78/78]
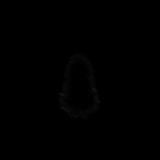

[Series 4: DWI · axial · 3.0mm · 1.50mm/px · z∈[-65,+83]mm · 2 of 39 slices shown (2 of 2)]
[im 1/39]
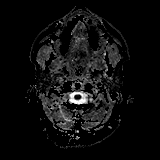
[im 39/39]
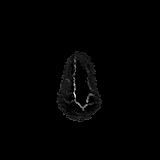

[Series 5: T2 · axial · 5.0mm · 0.57mm/px · 1 of 27 slices shown (1 of 2)]
[im 1/27]
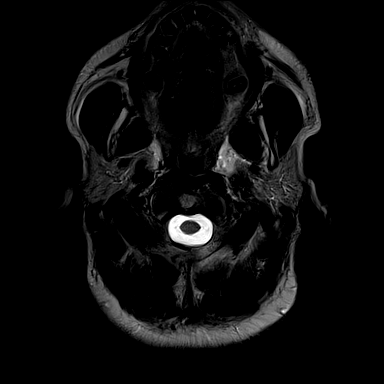

[Series 6: swi_images · axial · 1.5mm · 0.94mm/px · z∈[-81,+85]mm · 5 of 112 slices shown]
[im 1/112]
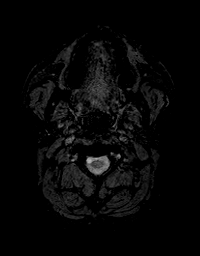
[im 28/112]
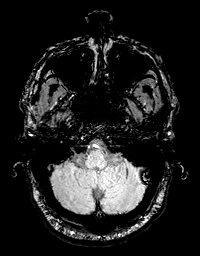
[im 56/112]
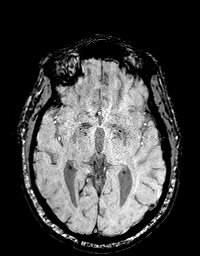
[im 84/112]
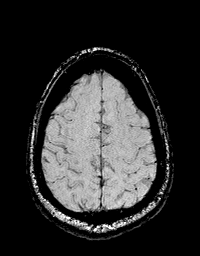
[im 112/112]
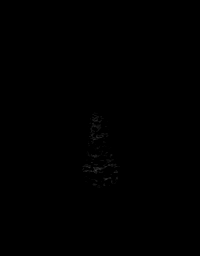

[Series 7: mip_images(sw) · axial · 12.0mm · 0.94mm/px · z∈[-76,+80]mm · 5 of 105 slices shown]
[im 1/105]
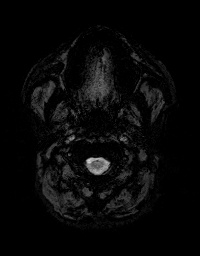
[im 27/105]
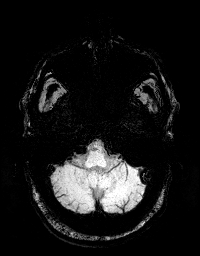
[im 53/105]
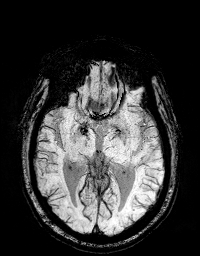
[im 79/105]
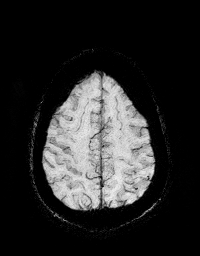
[im 105/105]
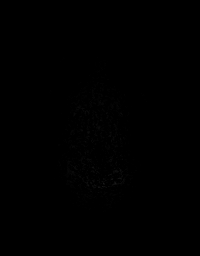

[Series 8: FLAIR · axial · 3.0mm · 0.94mm/px · z∈[-65,+85]mm · 2 of 51 slices shown (2 of 2)]
[im 1/51]
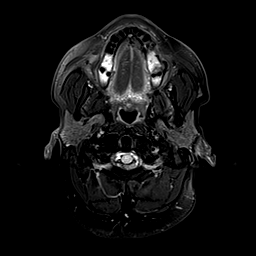
[im 51/51]
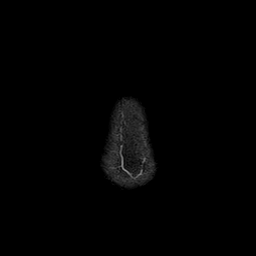

[Series 9: T2 · axial · non-contrast · 1.0mm · 0.94mm/px · z∈[-67,+89]mm · 7 of 160 slices shown (2 of 2)]
[im 1/160]
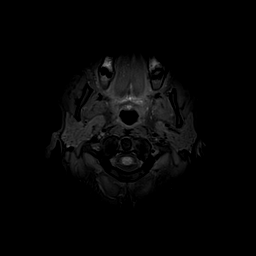
[im 27/160]
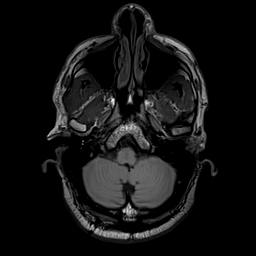
[im 54/160]
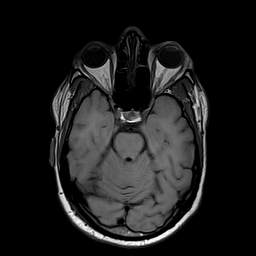
[im 80/160]
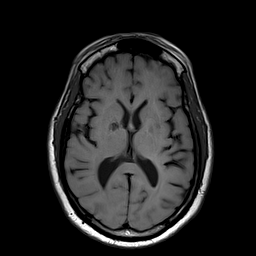
[im 107/160]
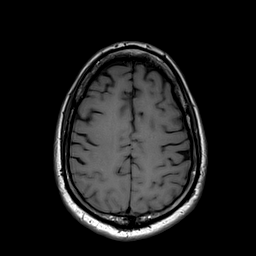
[im 133/160]
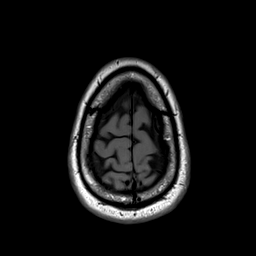
[im 160/160]
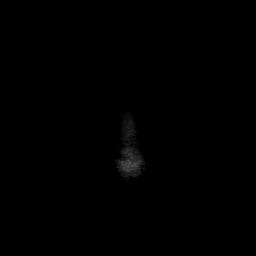

[Series 10: T2 post-contrast · coronal · 3.0mm · 0.57mm/px · 2 of 47 slices shown (1 of 2)]
[im 1/47]
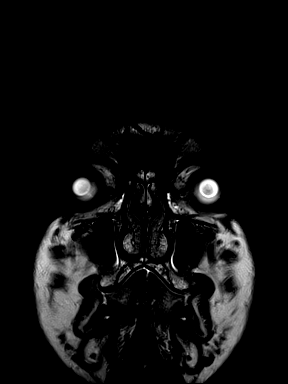
[im 47/47]
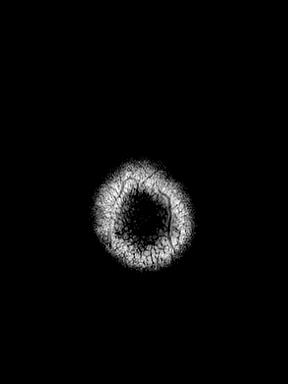

[Series 11: T2 post-contrast · axial · 1.0mm · 0.94mm/px · z∈[-67,+89]mm · 7 of 160 slices shown (2 of 2)]
[im 1/160]
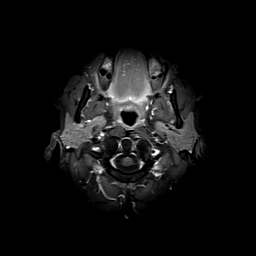
[im 27/160]
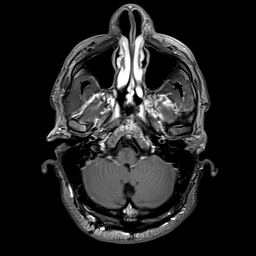
[im 54/160]
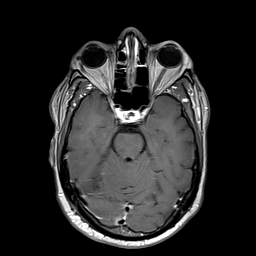
[im 80/160]
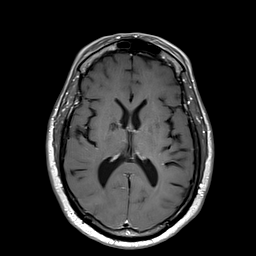
[im 107/160]
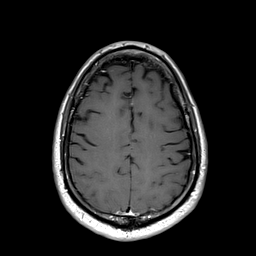
[im 133/160]
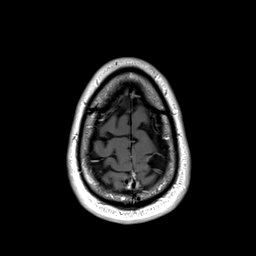
[im 160/160]
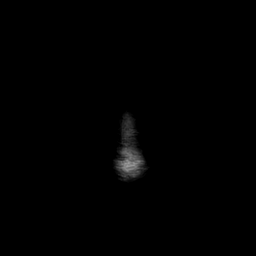

[Series 12: T1 post-contrast · axial · 1.0mm · 0.75mm/px · z∈[-68,+91]mm · 7 of 160 slices shown (1 of 2)]
[im 1/160]
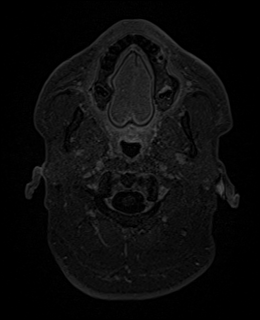
[im 27/160]
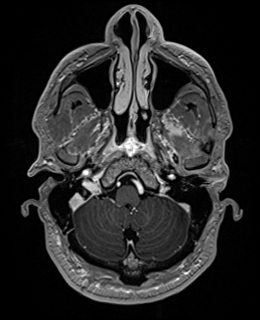
[im 54/160]
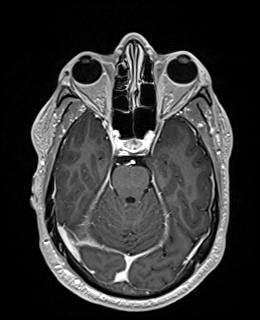
[im 80/160]
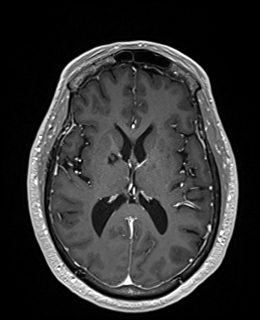
[im 107/160]
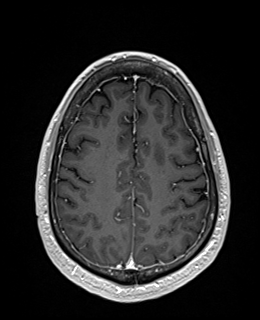
[im 133/160]
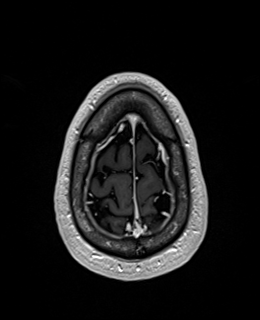
[im 160/160]
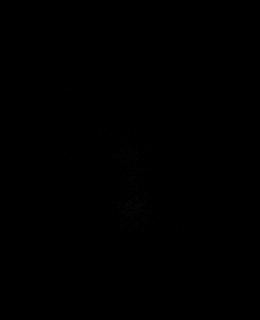

[Series 13: T1 post-contrast · coronal · 3.0mm · 0.57mm/px · 2 of 47 slices shown (2 of 2)]
[im 1/47]
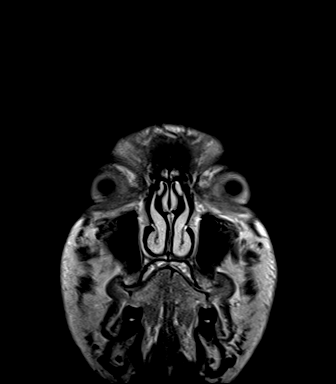
[im 47/47]
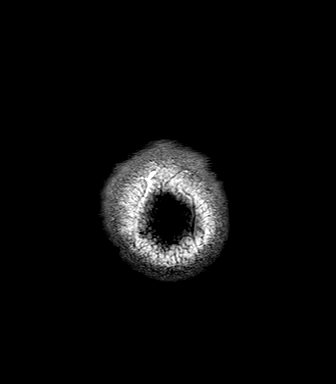

[Series 14: FLAIR post-contrast · sagittal · 3.0mm · 0.78mm/px · 2 of 39 slices shown]
[im 1/39]
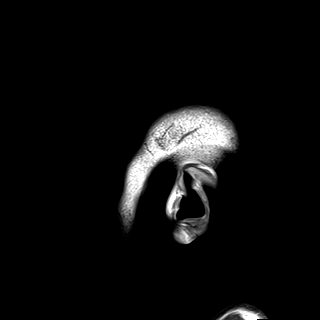
[im 39/39]
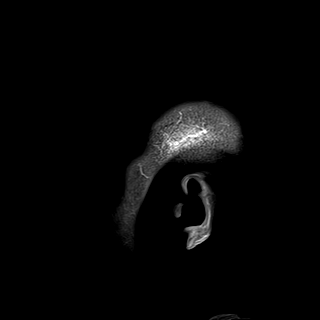

[48 of 48 positions shown; findings below may reference images not displayed]

FINDINGS: Brain: Enhancing lesion (series 12, image 123) in the left
precentral gyrus, which correlates with a punctate focus of
restricted diffusion (series 7, image 78) and increased T2 signal
(series 8, image 41); the lesion measures up to 4 mm, previously 2
mm. This correlates with the site of a previously noted enhancing
lesion, last seen to enhance on [DATE]. No other enhancing
lesions are seen.

No other restricted diffusion to suggest acute or subacute infarct.
No acute hemorrhage, mass effect, or midline shift. T2 hyperintense
signal in the periventricular white matter, likely the sequela of
chronic small vessel ischemic disease. Bilateral basal ganglia
lacunar infarcts.

Vascular: Normal flow voids.

Skull and upper cervical spine: Normal marrow signal.

Sinuses/Orbits: Negative.

Other: The mastoids are well aerated.
IMPRESSION: Enhancing 4 mm lesion in the left precentral gyrus with associated
edema, most likely a metastatic lesion, which correlates with the
location of a previously treated metastatic lesion, last seen to
enhance in [DATE]. No other abnormal enhancement.

## 2021-08-03 MED ORDER — GADOBENATE DIMEGLUMINE 529 MG/ML IV SOLN
17.0000 mL | Freq: Once | INTRAVENOUS | Status: AC | PRN
  Administered 2021-08-03: 17 mL via INTRAVENOUS

## 2021-08-08 ENCOUNTER — Inpatient Hospital Stay: Payer: No Typology Code available for payment source | Attending: Internal Medicine | Admitting: Internal Medicine

## 2021-08-08 ENCOUNTER — Other Ambulatory Visit: Payer: Self-pay

## 2021-08-08 VITALS — BP 160/93 | HR 95 | Temp 96.8°F | Resp 17 | Wt 189.1 lb

## 2021-08-08 DIAGNOSIS — C3411 Malignant neoplasm of upper lobe, right bronchus or lung: Secondary | ICD-10-CM | POA: Diagnosis present

## 2021-08-08 DIAGNOSIS — Z5112 Encounter for antineoplastic immunotherapy: Secondary | ICD-10-CM | POA: Insufficient documentation

## 2021-08-08 DIAGNOSIS — C7931 Secondary malignant neoplasm of brain: Secondary | ICD-10-CM | POA: Diagnosis not present

## 2021-08-08 DIAGNOSIS — N289 Disorder of kidney and ureter, unspecified: Secondary | ICD-10-CM | POA: Diagnosis not present

## 2021-08-08 DIAGNOSIS — Z79899 Other long term (current) drug therapy: Secondary | ICD-10-CM | POA: Diagnosis not present

## 2021-08-08 NOTE — Progress Notes (Signed)
[facility] [place], [place] [address] [phone]   Interval Evaluation  Date of Service: 08/08/21 Patient Name: Randy Andersen Patient MRN: [id number] Patient DOB: 1956-05-26 Provider: Ventura Sellers, MD  Identifying Statement:  Randy Andersen is a 66 y.o. male with Brain metastases [facility])   Primary Cancer:  Oncologic History: Oncology History  Malignant neoplasm of upper lobe of right lung ([place])  02/19/2020 Initial Diagnosis   Malignant neoplasm of upper lobe of right lung ([facility])   04/07/2020 -  Chemotherapy   Patient is on Treatment Plan : LUNG CARBOplatin / Pemetrexed / Pembrolizumab q21d Induction x 4 cycles / Maintenance Pemetrexed + Pembrolizumab      CNS Oncologic History: 03/18/20: SRS to two supratentorial metastases [name])  Interval History: Randy Andersen presents today for follow up after completing recent MRI study.  Denies new or progressive deficits today.  Some issues with fatigue, memory.  Denies headaches, seizures.  H+P (12/21/20) Patient presents for follow up after recent MRI.  He complains of short term memory impairment, cognitive "slowing" since starting chemotherapy last year.  He also describes some difficulty getting words out or finding the correct words at times.  No issues with comprehension.  He is otherwise functional, independent.  He is able to cook, pay the bills, drives himself.  Currently on immuotherapy with Dr. Julien Nordmann.  Does acknowledge stroke ~10 years ago which affected his right sided sensation.   Medications: Current Outpatient Medications on File Prior to Visit  Medication Sig Dispense Refill   amLODipine (NORVASC) 10 MG tablet Take 10 mg by mouth daily.     Cholecalciferol 100 MCG (4000 UT) TABS Take 2 tablets by mouth daily.     doxylamine, Sleep, (UNISOM) 25 MG tablet Take 1 tablet (25 mg total) by mouth at bedtime as needed for sleep. 30 tablet 0   HYDROcodone-acetaminophen (NORCO)  10-325 MG tablet Take 1 tablet by mouth 2 (two) times daily.     oxybutynin (DITROPAN-XL) 10 MG 24 hr tablet Take by mouth.     phenazopyridine (PYRIDIUM) 200 MG tablet Take 1 tablet (200 mg total) by mouth 3 (three) times daily. 6 tablet 0   polyethylene glycol powder (GLYCOLAX/MIRALAX) 17 GM/SCOOP powder      prochlorperazine (COMPAZINE) 10 MG tablet Take 1 tablet (10 mg total) by mouth every 6 (six) hours as needed. 30 tablet 2   rivaroxaban (XARELTO) 20 MG TABS tablet TAKE 1 TABLET BY MOUTH ONCE A DAY WITH SUPPER 30 tablet 2   senna-docusate (SENOKOT-S) 8.6-50 MG tablet Take 2 tablets by mouth daily.     tadalafil (CIALIS) 20 MG tablet Take by mouth.     tamsulosin (FLOMAX) 0.4 MG CAPS capsule Take 1 capsule (0.4 mg total) by mouth daily. 30 capsule 2   No current facility-administered medications on file prior to visit.    Allergies: No Known Allergies Past Medical History:  Past Medical History:  Diagnosis Date   Asthma    as a child   Chronic pain disorder    LT leg and foot   Heart murmur    as a child   Hypertension    Malignant neoplasm of upper lobe of right lung ([facility])    Stroke ([facility])    mini stroke - 2015, some tingling in fingers on right    Past Surgical History:  Past Surgical History:  Procedure Laterality Date   BUNIONECTOMY Left    had hammer toe surgery  also and callus removed   BUNIONECTOMY Right    also had hammer toe surgery and callus removed   LEG SURGERY Left    PT reports he has pins placed in his Lt leg   ORIF TIBIA PLATEAU  10/09/2012   Dr Lorin Mercy   ORIF TIBIA PLATEAU Left 10/08/2012   Procedure: OPEN REDUCTION INTERNAL FIXATION (ORIF) TIBIAL PLATEAU;  Surgeon: Marybelle Killings, MD;  Location: [facility];  Service: Orthopedics;  Laterality: Left;   VIDEO BRONCHOSCOPY WITH ENDOBRONCHIAL NAVIGATION N/A 02/25/2020   Procedure: VIDEO BRONCHOSCOPY WITH ENDOBRONCHIAL NAVIGATION with biopsies;  Surgeon: Collene Gobble, MD;  Location: [facility];  Service: Thoracic;   Laterality: N/A;   VIDEO BRONCHOSCOPY WITH ENDOBRONCHIAL ULTRASOUND N/A 02/25/2020   Procedure: VIDEO BRONCHOSCOPY WITH ENDOBRONCHIAL ULTRASOUND;  Surgeon: Collene Gobble, MD;  Location: [facility];  Service: Thoracic;  Laterality: N/A;   Social History:  Social History   Socioeconomic History   Marital status: Single    Spouse name: Not on file   Number of children: Not on file   Years of education: Not on file   Highest education level: Not on file  Occupational History   Not on file  Tobacco Use   Smoking status: Every Day    Packs/day: 0.50    Years: 28.00    Pack years: 14.00    Types: Cigarettes   Smokeless tobacco: Never  Vaping Use   Vaping Use: Never used  Substance and Sexual Activity   Alcohol use: No   Drug use: No   Sexual activity: Not on file  Other Topics Concern   Not on file  Social History Narrative   Not on file   Social Determinants of Health   Financial Resource Strain: Not on file  Food Insecurity: Not on file  Transportation Needs: Not on file  Physical Activity: Not on file  Stress: Not on file  Social Connections: Not on file  Intimate Partner Violence: Not on file   Family History:  Family History  Problem Relation Age of Onset   Cancer Mother        Intestinal metastatic to ovaries    Review of Systems: Constitutional: Doesn't report fevers, chills or abnormal weight loss Eyes: Doesn't report blurriness of vision Ears, nose, mouth, throat, and face: Doesn't report sore throat Respiratory: Doesn't report cough, dyspnea or wheezes Cardiovascular: Doesn't report palpitation, chest discomfort  Gastrointestinal:  Doesn't report nausea, constipation, diarrhea GU: Doesn't report incontinence Skin: Doesn't report skin rashes Neurological: Per HPI Musculoskeletal: Doesn't report joint pain Behavioral/Psych: Doesn't report anxiety  Physical Exam: Vitals:   08/08/21 1004  BP: (!) 160/93  Pulse: 95  Resp: 17  Temp: (!) 96.8 F (36 C)   SpO2: 100%    KPS: 80. General: Alert, cooperative, pleasant, in no acute distress Head: Normal EENT: No conjunctival injection or scleral icterus.  Lungs: Resp effort normal Cardiac: Regular rate Abdomen: Non-distended abdomen Skin: No rashes cyanosis or petechiae. Extremities: No clubbing or edema  Neurologic Exam: Mental Status: Awake, alert, attentive to examiner. Oriented to self and environment. Language is fluent with intact comprehension.  Some psychomotor slowing. Cranial Nerves: Visual acuity is grossly normal. Visual fields are full. Extra-ocular movements intact. No ptosis. Face is symmetric Motor: Tone and bulk are normal. Power is full in both arms and legs. Reflexes are symmetric, no pathologic reflexes present.  Sensory: Intact to light touch Gait: Normal.   Labs: I have reviewed the data as listed  Component Value Date/Time   NA 140 07/20/2021 1317   K 4.2 07/20/2021 1317   CL 107 07/20/2021 1317   CO2 25 07/20/2021 1317   GLUCOSE 104 (H) 07/20/2021 1317   BUN 19 07/20/2021 1317   CREATININE 1.74 (H) 07/20/2021 1317   CALCIUM 9.7 07/20/2021 1317   PROT 7.7 07/20/2021 1317   ALBUMIN 4.1 07/20/2021 1317   AST 14 (L) 07/20/2021 1317   ALT 10 07/20/2021 1317   ALKPHOS 96 07/20/2021 1317   BILITOT 0.5 07/20/2021 1317   GFRNONAA 43 (L) 07/20/2021 1317   GFRAA 52 (L) 04/21/2020 1403   Lab Results  Component Value Date   WBC 4.4 07/20/2021   NEUTROABS 2.7 07/20/2021   HGB 15.0 07/20/2021   HCT 44.8 07/20/2021   MCV 87.5 07/20/2021   PLT 168 07/20/2021    Imaging:  MR BRAIN W WO CONTRAST  Result Date: 08/03/2021 CLINICAL DATA:  Lung cancer, brain metastases, assess treatment response EXAM: MRI HEAD WITHOUT AND WITH CONTRAST TECHNIQUE: Multiplanar, multiecho pulse sequences of the brain and surrounding structures were obtained without and with intravenous contrast. CONTRAST:  64mL MULTIHANCE GADOBENATE DIMEGLUMINE 529 MG/ML IV SOLN COMPARISON:   02/18/2021 FINDINGS: Brain: Enhancing lesion (series 12, image 123) in the left precentral gyrus, which correlates with a punctate focus of restricted diffusion (series 7, image 78) and increased T2 signal (series 8, image 41); the lesion measures up to 4 mm, previously 2 mm. This correlates with the site of a previously noted enhancing lesion, last seen to enhance on 03/13/2020. No other enhancing lesions are seen. No other restricted diffusion to suggest acute or subacute infarct. No acute hemorrhage, mass effect, or midline shift. T2 hyperintense signal in the periventricular white matter, likely the sequela of chronic small vessel ischemic disease. Bilateral basal ganglia lacunar infarcts. Vascular: Normal flow voids. Skull and upper cervical spine: Normal marrow signal. Sinuses/Orbits: Negative. Other: The mastoids are well aerated. IMPRESSION: Enhancing 4 mm lesion in the left precentral gyrus with associated edema, most likely a metastatic lesion, which correlates with the location of a previously treated metastatic lesion, last seen to enhance in August 2021. No other abnormal enhancement. Electronically Signed   By: Merilyn Baba M.D.   On: 08/03/2021 15:14     [facility] Clinician Interpretation: I have personally reviewed the radiological images as listed.  My interpretation, in the context of the patient's clinical presentation, is treatment effect vs true progression   Assessment/Plan Brain metastases ([place]) [C79.31]  Randy Andersen is clinically stable today.  MRI brain demonstrates modest progression of previously treated left pre-central gyrus metastasis.  Etiology is either radiation treatment effect (favored) or tumor progression.  Other treated lesions are stable.  He will continue to follow with Dr. Julien Nordmann, still doing well with keytruda monotherapy.  We appreciate the opportunity to participate in the care of Randy Andersen.    We ask that Randy Andersen return to clinic in 3 months following  next brain MRI, or sooner as needed.  All questions were answered. The patient knows to call the clinic with any problems, questions or concerns. No barriers to learning were detected.  The total time spent in the encounter was 30 minutes and more than 50% was on counseling and review of test results   Ventura Sellers, MD Medical Director of Neuro-Oncology [facility] at [facility] 08/08/21 10:04 AM

## 2021-08-09 ENCOUNTER — Other Ambulatory Visit: Payer: Self-pay | Admitting: Internal Medicine

## 2021-08-09 ENCOUNTER — Telehealth: Payer: Self-pay | Admitting: Internal Medicine

## 2021-08-09 DIAGNOSIS — C3411 Malignant neoplasm of upper lobe, right bronchus or lung: Secondary | ICD-10-CM

## 2021-08-09 NOTE — Telephone Encounter (Signed)
Left message with follow-up appointment per 1/16 los.

## 2021-08-10 ENCOUNTER — Other Ambulatory Visit: Payer: Self-pay

## 2021-08-10 ENCOUNTER — Inpatient Hospital Stay: Payer: No Typology Code available for payment source

## 2021-08-10 ENCOUNTER — Other Ambulatory Visit: Payer: Self-pay | Admitting: Internal Medicine

## 2021-08-10 ENCOUNTER — Inpatient Hospital Stay (HOSPITAL_BASED_OUTPATIENT_CLINIC_OR_DEPARTMENT_OTHER): Payer: No Typology Code available for payment source | Admitting: Internal Medicine

## 2021-08-10 ENCOUNTER — Encounter: Payer: Self-pay | Admitting: Internal Medicine

## 2021-08-10 VITALS — BP 154/95 | HR 94 | Temp 97.2°F | Resp 19 | Ht 74.0 in | Wt 189.5 lb

## 2021-08-10 DIAGNOSIS — C3411 Malignant neoplasm of upper lobe, right bronchus or lung: Secondary | ICD-10-CM

## 2021-08-10 DIAGNOSIS — Z5112 Encounter for antineoplastic immunotherapy: Secondary | ICD-10-CM

## 2021-08-10 LAB — CMP (CANCER CENTER ONLY)
ALT: 10 U/L (ref 0–44)
AST: 13 U/L — ABNORMAL LOW (ref 15–41)
Albumin: 4 g/dL (ref 3.5–5.0)
Alkaline Phosphatase: 105 U/L (ref 38–126)
Anion gap: 7 (ref 5–15)
BUN: 20 mg/dL (ref 8–23)
CO2: 24 mmol/L (ref 22–32)
Calcium: 9.6 mg/dL (ref 8.9–10.3)
Chloride: 106 mmol/L (ref 98–111)
Creatinine: 1.62 mg/dL — ABNORMAL HIGH (ref 0.61–1.24)
GFR, Estimated: 47 mL/min — ABNORMAL LOW (ref >60)
Glucose, Bld: 92 mg/dL (ref 70–99)
Potassium: 4.1 mmol/L (ref 3.5–5.1)
Sodium: 137 mmol/L (ref 135–145)
Total Bilirubin: 0.4 mg/dL (ref 0.3–1.2)
Total Protein: 7.7 g/dL (ref 6.5–8.1)

## 2021-08-10 LAB — CBC WITH DIFFERENTIAL (CANCER CENTER ONLY)
Abs Immature Granulocytes: 0.01 10*3/uL (ref 0.00–0.07)
Basophils Absolute: 0 10*3/uL (ref 0.0–0.1)
Basophils Relative: 1 %
Eosinophils Absolute: 0.1 10*3/uL (ref 0.0–0.5)
Eosinophils Relative: 3 %
HCT: 46 % (ref 39.0–52.0)
Hemoglobin: 15.2 g/dL (ref 13.0–17.0)
Immature Granulocytes: 0 %
Lymphocytes Relative: 21 %
Lymphs Abs: 1.2 10*3/uL (ref 0.7–4.0)
MCH: 28.7 pg (ref 26.0–34.0)
MCHC: 33 g/dL (ref 30.0–36.0)
MCV: 87 fL (ref 80.0–100.0)
Monocytes Absolute: 0.8 10*3/uL (ref 0.1–1.0)
Monocytes Relative: 13 %
Neutro Abs: 3.5 10*3/uL (ref 1.7–7.7)
Neutrophils Relative %: 62 %
Platelet Count: 215 10*3/uL (ref 150–400)
RBC: 5.29 MIL/uL (ref 4.22–5.81)
RDW: 13 % (ref 11.5–15.5)
WBC Count: 5.7 10*3/uL (ref 4.0–10.5)
nRBC: 0 % (ref 0.0–0.2)

## 2021-08-10 LAB — TSH: TSH: 1.363 u[IU]/mL (ref 0.320–4.118)

## 2021-08-10 MED ORDER — SODIUM CHLORIDE 0.9 % IV SOLN
200.0000 mg | Freq: Once | INTRAVENOUS | Status: AC
  Administered 2021-08-10: 200 mg via INTRAVENOUS
  Filled 2021-08-10: qty 200

## 2021-08-10 MED ORDER — SODIUM CHLORIDE 0.9 % IV SOLN: Freq: Once | INTRAVENOUS | Status: AC

## 2021-08-10 NOTE — Progress Notes (Signed)
[facility] Telephone:([phone]) [phone]   Fax:([phone]) [facility] [address] [place] [address]  DIAGNOSIS: Stage IV non-small cell lung cancer, favored to be adenocarcinoma.  He presented with posterior right upper lobe nodule as well as right hilar, infrahilar, subcarinal, and right paratracheal lymphadenopathy.  He also has a 1.4 cm x 1 cm soft tissue nodule in the skin/subcutaneous fat in the left axilla.  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  He was diagnosed in August 2021   Molecular Biomarkers: BIOMARKER(S)         % CFDNA OR AMPLIFICATION       ASSOCIATED FDA-APPROVED THERAPIES        CLINICAL TRIAL AVAILABILITY [id number] 2.9% None    Yes [id number] 0.4% None    Yes [id number] 0.2% None    Yes   PRIOR THERAPY:  1) SRS to the small metastatic brain lesion under the care of Dr. [name] on 03/18/20   CURRENT THERAPY:  1) Palliative systemic chemotherapy with carboplatin for an AUC of 5, Alimta 500 mg/m2, and Keytruda 200 mg IV every 3 weeks. First dose expected on 04/07/20.  Status post 23 cycles.  Starting from cycle #5 he will be on maintenance treatment with Alimta and Keytruda every 3 weeks.  For the last few cycles he is currently on maintenance treatment with single agent Keytruda. 2) SBRT to the enlarging right upper lobe lung mass under the care of Dr. [name].  INTERVAL HISTORY: Randy Andersen 66 y.o. male returns to the clinic today for follow-up visit.  The patient is feeling fine today with no concerning complaints.  He continues to tolerate his treatment with Keytruda fairly well.  He denied having any chest pain, shortness of breath, cough or hemoptysis.  He denied having any fever or chills.  He has no nausea, vomiting, diarrhea or constipation.  He had MRI of the brain performed recently that showed enhancing 4 mm lesion in the left precentral gyrus with  associated edema suspicious for metastatic lesion.  The patient was seen by Dr. Mickeal Skinner and he will monitor it for now.  Is here today for evaluation before starting cycle #24.  MEDICAL HISTORY: Past Medical History:  Diagnosis Date   Asthma    as a child   Chronic pain disorder    LT leg and foot   Heart murmur    as a child   Hypertension    Malignant neoplasm of upper lobe of right lung ([facility])    Stroke ([facility])    mini stroke - 2015, some tingling in fingers on right     ALLERGIES:  has No Known Allergies.  MEDICATIONS:  Current Outpatient Medications  Medication Sig Dispense Refill   amLODipine (NORVASC) 10 MG tablet Take 10 mg by mouth daily.     Cholecalciferol 100 MCG (4000 UT) TABS Take 2 tablets by mouth daily.     diclofenac Sodium (VOLTAREN) 1 % GEL Apply 4 g topically 3 (three) times daily.     doxylamine, Sleep, (UNISOM) 25 MG tablet Take 1 tablet (25 mg total) by mouth at bedtime as needed for sleep. (Patient not taking: Reported on 08/08/2021) 30 tablet 0   HYDROcodone-acetaminophen (NORCO) 10-325 MG tablet Take 1 tablet by mouth 2 (two) times daily.     oxybutynin (DITROPAN-XL) 10 MG 24 hr tablet Take by mouth.     polyethylene glycol powder (  GLYCOLAX/MIRALAX) 17 GM/SCOOP powder      prochlorperazine (COMPAZINE) 10 MG tablet Take 1 tablet (10 mg total) by mouth every 6 (six) hours as needed. 30 tablet 2   rivaroxaban (XARELTO) 20 MG TABS tablet TAKE 1 TABLET BY MOUTH ONCE A DAY WITH SUPPER 30 tablet 2   senna-docusate (SENOKOT-S) 8.6-50 MG tablet Take 2 tablets by mouth daily.     tadalafil (CIALIS) 20 MG tablet Take by mouth.     tamsulosin (FLOMAX) 0.4 MG CAPS capsule Take 1 capsule (0.4 mg total) by mouth daily. 30 capsule 2   No current facility-administered medications for this visit.    SURGICAL HISTORY:  Past Surgical History:  Procedure Laterality Date   BUNIONECTOMY Left    had hammer toe surgery also and callus removed   BUNIONECTOMY Right    also had  hammer toe surgery and callus removed   LEG SURGERY Left    PT reports he has pins placed in his Lt leg   ORIF TIBIA PLATEAU  10/09/2012   Dr Lorin Mercy   ORIF TIBIA PLATEAU Left 10/08/2012   Procedure: OPEN REDUCTION INTERNAL FIXATION (ORIF) TIBIAL PLATEAU;  Surgeon: Marybelle Killings, MD;  Location: [facility];  Service: Orthopedics;  Laterality: Left;   VIDEO BRONCHOSCOPY WITH ENDOBRONCHIAL NAVIGATION N/A 02/25/2020   Procedure: VIDEO BRONCHOSCOPY WITH ENDOBRONCHIAL NAVIGATION with biopsies;  Surgeon: Collene Gobble, MD;  Location: [facility];  Service: Thoracic;  Laterality: N/A;   VIDEO BRONCHOSCOPY WITH ENDOBRONCHIAL ULTRASOUND N/A 02/25/2020   Procedure: VIDEO BRONCHOSCOPY WITH ENDOBRONCHIAL ULTRASOUND;  Surgeon: Collene Gobble, MD;  Location: [facility];  Service: Thoracic;  Laterality: N/A;    REVIEW OF SYSTEMS:  A comprehensive review of systems was negative.   PHYSICAL EXAMINATION: General appearance: alert, cooperative, and no distress Head: Normocephalic, without obvious abnormality, atraumatic Neck: no adenopathy, no JVD, supple, symmetrical, trachea midline, and thyroid not enlarged, symmetric, no tenderness/mass/nodules Lymph nodes: Cervical, supraclavicular, and axillary nodes normal. Resp: clear to auscultation bilaterally Back: symmetric, no curvature. ROM normal. No CVA tenderness. Cardio: regular rate and rhythm, S1, S2 normal, no murmur, click, rub or gallop GI: soft, non-tender; bowel sounds normal; no masses,  no organomegaly Extremities: extremities normal, atraumatic, no cyanosis or edema  ECOG PERFORMANCE STATUS: 1 - Symptomatic but completely ambulatory  Blood pressure (!) 154/95, pulse 94, temperature (!) 97.2 F (36.2 C), temperature source Tympanic, resp. rate 19, height 6\' 2"  (1.88 m), weight 189 lb 8 oz (86 kg), SpO2 97 %.  LABORATORY DATA: Lab Results  Component Value Date   WBC 5.7 08/10/2021   HGB 15.2 08/10/2021   HCT 46.0 08/10/2021   MCV 87.0 08/10/2021   PLT 215  08/10/2021      Chemistry      Component Value Date/Time   NA 140 07/20/2021 1317   K 4.2 07/20/2021 1317   CL 107 07/20/2021 1317   CO2 25 07/20/2021 1317   BUN 19 07/20/2021 1317   CREATININE 1.74 (H) 07/20/2021 1317      Component Value Date/Time   CALCIUM 9.7 07/20/2021 1317   ALKPHOS 96 07/20/2021 1317   AST 14 (L) 07/20/2021 1317   ALT 10 07/20/2021 1317   BILITOT 0.5 07/20/2021 1317       RADIOGRAPHIC STUDIES: MR BRAIN W WO CONTRAST  Result Date: 08/03/2021 CLINICAL DATA:  Lung cancer, brain metastases, assess treatment response EXAM: MRI HEAD WITHOUT AND WITH CONTRAST TECHNIQUE: Multiplanar, multiecho pulse sequences of the brain and surrounding structures were obtained  without and with intravenous contrast. CONTRAST:  27mL MULTIHANCE GADOBENATE DIMEGLUMINE 529 MG/ML IV SOLN COMPARISON:  02/18/2021 FINDINGS: Brain: Enhancing lesion (series 12, image 123) in the left precentral gyrus, which correlates with a punctate focus of restricted diffusion (series 7, image 78) and increased T2 signal (series 8, image 41); the lesion measures up to 4 mm, previously 2 mm. This correlates with the site of a previously noted enhancing lesion, last seen to enhance on 03/13/2020. No other enhancing lesions are seen. No other restricted diffusion to suggest acute or subacute infarct. No acute hemorrhage, mass effect, or midline shift. T2 hyperintense signal in the periventricular white matter, likely the sequela of chronic small vessel ischemic disease. Bilateral basal ganglia lacunar infarcts. Vascular: Normal flow voids. Skull and upper cervical spine: Normal marrow signal. Sinuses/Orbits: Negative. Other: The mastoids are well aerated. IMPRESSION: Enhancing 4 mm lesion in the left precentral gyrus with associated edema, most likely a metastatic lesion, which correlates with the location of a previously treated metastatic lesion, last seen to enhance in August 2021. No other abnormal enhancement.  Electronically Signed   By: Merilyn Baba M.D.   On: 08/03/2021 15:14     ASSESSMENT AND PLAN: This is a very pleasant 66 years old African-American male diagnosed with stage IV non-small cell lung cancer, adenocarcinoma presented with right upper lobe nodule in addition to right hilar, infrahilar, subcarinal and right paratracheal adenopathy in addition to subcutaneous nodules in the left axilla and 2 small metastatic brain lesion diagnosed in August 2021.  The patient has no actionable mutations. He is currently undergoing systemic chemotherapy with carboplatin for AUC of 5, Alimta 500 mg/M2 and Keytruda 200 mg IV every 3 weeks.  Status post 23 cycles.  Starting from cycle #5 he will be on maintenance treatment with Alimta and Keytruda every 3 weeks.  I reduce the dose of his Alimta to 400 mg/M2 secondary to the renal insufficiency. The patient is currently on treatment with single agent Keytruda and Alimta was discontinued secondary to worsening renal insufficiency. He also underwent SBRT to the enlarging right upper lobe lung mass under the care of Dr. [name]. The patient continues to tolerate his treatment with [facility] fairly well. I recommended for him to proceed with cycle #24 today as planned. Will come back for follow-up visit in 3 weeks for evaluation before the next cycle of his treatment. The patient was advised to call immediately if he has any other concerning symptoms in the interval. The patient voices understanding of current disease status and treatment options and is in agreement with the current care plan.  All questions were answered. The patient knows to call the clinic with any problems, questions or concerns. We can certainly see the patient much sooner if necessary.   Disclaimer: This note was dictated with voice recognition software. Similar sounding words can inadvertently be transcribed and may not be corrected upon review.

## 2021-08-10 NOTE — Progress Notes (Signed)
Per Dr. Julien Nordmann, ok to treat with elevated Scr.

## 2021-08-10 NOTE — Patient Instructions (Signed)
[facility]   Discharge Instructions: Thank you for choosing [facility] to provide your oncology and hematology care.   If you have a lab appointment with the [facility], please go directly to the [facility] and check in at the registration area.   Wear comfortable clothing and clothing appropriate for easy access to any Portacath or PICC line.   We strive to give you quality time with your provider. You may need to reschedule your appointment if you arrive late (15 or more minutes).  Arriving late affects you and other patients whose appointments are after yours.  Also, if you miss three or more appointments without notifying the office, you may be dismissed from the clinic at the providers discretion.      For prescription refill requests, have your pharmacy contact our office and allow 72 hours for refills to be completed.    Today you received the following chemotherapy and/or immunotherapy agents: Pembrolizumab Beryle Flock)      To help prevent nausea and vomiting after your treatment, we encourage you to take your nausea medication as directed.  BELOW ARE SYMPTOMS THAT SHOULD BE REPORTED IMMEDIATELY: *FEVER GREATER THAN 100.4 F (38 C) OR HIGHER *CHILLS OR SWEATING *NAUSEA AND VOMITING THAT IS NOT CONTROLLED WITH YOUR NAUSEA MEDICATION *UNUSUAL SHORTNESS OF BREATH *UNUSUAL BRUISING OR BLEEDING *URINARY PROBLEMS (pain or burning when urinating, or frequent urination) *BOWEL PROBLEMS (unusual diarrhea, constipation, pain near the anus) TENDERNESS IN MOUTH AND THROAT WITH OR WITHOUT PRESENCE OF ULCERS (sore throat, sores in mouth, or a toothache) UNUSUAL RASH, SWELLING OR PAIN  UNUSUAL VAGINAL DISCHARGE OR ITCHING   Items with * indicate a potential emergency and should be followed up as soon as possible or go to the Emergency Department if any problems should occur.  Please show the CHEMOTHERAPY ALERT CARD or IMMUNOTHERAPY ALERT  CARD at check-in to the Emergency Department and triage nurse.  Should you have questions after your visit or need to cancel or reschedule your appointment, please contact [facility]  Dept: [phone]  and follow the prompts.  Office hours are 8:00 a.m. to 4:30 p.m. Monday - Friday. Please note that voicemails left after 4:00 p.m. may not be returned until the following business day.  We are closed weekends and major holidays. You have access to a nurse at all times for urgent questions. Please call the main number to the clinic Dept: [phone] and follow the prompts.   For any non-urgent questions, you may also contact your provider using MyChart. We now offer e-Visits for anyone 52 and older to request care online for non-urgent symptoms. For details visit [email or web address].   Also download the MyChart app! Go to the app store, search "MyChart", open the app, select , and log in with your MyChart username and password.  Due to Covid, a mask is required upon entering the hospital/clinic. If you do not have a mask, one will be given to you upon arrival. For doctor visits, patients may have 1 support person aged 77 or older with them. For treatment visits, patients cannot have anyone with them due to current Covid guidelines and our immunocompromised population.

## 2021-08-10 NOTE — Patient Instructions (Signed)
Steps to Quit Smoking Smoking tobacco is the leading cause of preventable death. It can affect almost every organ in the body. Smoking puts you and people around you at risk for many serious, long-lasting (chronic) diseases. Quitting smoking can be hard, but it is one of the best things that you can do for your health. It is never too late to quit. How do I get ready to quit? When you decide to quit smoking, make a plan to help you succeed. Before you quit: Pick a date to quit. Set a date within the next 2 weeks to give you time to prepare. Write down the reasons why you are quitting. Keep this list in places where you will see it often. Tell your family, friends, and co-workers that you are quitting. Their support is important. Talk with your doctor about the choices that may help you quit. Find out if your health insurance will pay for these treatments. Know the people, places, things, and activities that make you want to smoke (triggers). Avoid them. What first steps can I take to quit smoking? Throw away all cigarettes at home, at work, and in your car. Throw away the things that you use when you smoke, such as ashtrays and lighters. Clean your car. Make sure to empty the ashtray. Clean your home, including curtains and carpets. What can I do to help me quit smoking? Talk with your doctor about taking medicines and seeing a counselor at the same time. You are more likely to succeed when you do both. If you are pregnant or breastfeeding, talk with your doctor about counseling or other ways to quit smoking. Do not take medicine to help you quit smoking unless your doctor tells you to do so. To quit smoking: Quit right away Quit smoking totally, instead of slowly cutting back on how much you smoke over a period of time. Go to counseling. You are more likely to quit if you go to counseling sessions regularly. Take medicine You may take medicines to help you quit. Some medicines need a  prescription, and some you can buy over-the-counter. Some medicines may contain a drug called nicotine to replace the nicotine in cigarettes. Medicines may: Help you to stop having the desire to smoke (cravings). Help to stop the problems that come when you stop smoking (withdrawal symptoms). Your doctor may ask you to use: Nicotine patches, gum, or lozenges. Nicotine inhalers or sprays. Non-nicotine medicine that is taken by mouth. Find resources Find resources and other ways to help you quit smoking and remain smoke-free after you quit. These resources are most helpful when you use them often. They include: Online chats with a counselor. Phone quitlines. Printed self-help materials. Support groups or group counseling. Text messaging programs. Mobile phone apps. Use apps on your mobile phone or tablet that can help you stick to your quit plan. There are many free apps for mobile phones and tablets as well as websites. Examples include Quit Guide from the CDC and smokefree.gov  What things can I do to make it easier to quit?  Talk to your family and friends. Ask them to support and encourage you. Call a phone quitline (1-800-QUIT-NOW), reach out to support groups, or work with a counselor. Ask people who smoke to not smoke around you. Avoid places that make you want to smoke, such as: Bars. Parties. Smoke-break areas at work. Spend time with people who do not smoke. Lower the stress in your life. Stress can make you want to   smoke. Try these things to help your stress: Getting regular exercise. Doing deep-breathing exercises. Doing yoga. Meditating. Doing a body scan. To do this, close your eyes, focus on one area of your body at a time from head to toe. Notice which parts of your body are tense. Try to relax the muscles in those areas. How will I feel when I quit smoking? Day 1 to 3 weeks Within the first 24 hours, you may start to have some problems that come from quitting tobacco.  These problems are very bad 2-3 days after you quit, but they do not often last for more than 2-3 weeks. You may get these symptoms: Mood swings. Feeling restless, nervous, angry, or annoyed. Trouble concentrating. Dizziness. Strong desire for high-sugar foods and nicotine. Weight gain. Trouble pooping (constipation). Feeling like you may vomit (nausea). Coughing or a sore throat. Changes in how the medicines that you take for other issues work in your body. Depression. Trouble sleeping (insomnia). Week 3 and afterward After the first 2-3 weeks of quitting, you may start to notice more positive results, such as: Better sense of smell and taste. Less coughing and sore throat. Slower heart rate. Lower blood pressure. Clearer skin. Better breathing. Fewer sick days. Quitting smoking can be hard. Do not give up if you fail the first time. Some people need to try a few times before they succeed. Do your best to stick to your quit plan, and talk with your doctor if you have any questions or concerns. Summary Smoking tobacco is the leading cause of preventable death. Quitting smoking can be hard, but it is one of the best things that you can do for your health. When you decide to quit smoking, make a plan to help you succeed. Quit smoking right away, not slowly over a period of time. When you start quitting, seek help from your doctor, family, or friends. This information is not intended to replace advice given to you by your health care provider. Make sure you discuss any questions you have with your health care provider. Document Revised: 03/18/2021 Document Reviewed: 09/28/2018 Elsevier Patient Education  2022 [employer]  

## 2021-08-18 NOTE — Progress Notes (Signed)
[place] [address]  DIAGNOSIS: Stage IV non-small cell lung cancer, favored to be adenocarcinoma.  He presented with posterior right upper lobe nodule as well as right hilar, infrahilar, subcarinal, and right paratracheal lymphadenopathy.  He also has a 1.4 cm x 1 cm soft tissue nodule in the skin/subcutaneous fat in the left axilla.  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  He was diagnosed in August 2021   Molecular Biomarkers: BIOMARKER(S)         % CFDNA OR AMPLIFICATION       ASSOCIATED FDA-APPROVED THERAPIES        CLINICAL TRIAL AVAILABILITY [id number] 2.9% None    Yes [id number] 0.4% None    Yes [id number] 0.2% None    Yes    PRIOR THERAPY: 1) SRS to the small metastatic brain lesion under the care of Dr. Lisbeth Andersen on 03/18/20 2) Radiation to the enlarging right upper lobe lung mass under the care of Dr. Lisbeth Andersen. Last dose on 04/06/21.   CURRENT THERAPY: Palliative systemic chemotherapy with carboplatin for an AUC of 5, Alimta 500 mg/m2, and Keytruda 200 mg IV every 3 weeks. First dose expected on 04/07/20. Status post 24 cycles. Starting from cycle #5 he will be on maintenance treatment with Alimta and Keytruda every 3 weeks. His dose of Alimta was reduced to 400 mg/m2. Alimta removed starting from cycle #11 due to renal insuffiencey.   INTERVAL HISTORY: Randy Andersen 66 y.o. male returns to the clinic today for a follow-up visit.  The patient is feeling well today without any concerning complaints.  He is currently undergoing single agent immunotherapy with Keytruda.  He is tolerating treatment well without any concerning adverse side effects.  Alimta was removed from his treatment plan due to renal insufficiency.  The patient denies any recent fever, chills, night sweats, or unexplained weight loss.  He denies any shortness of breath, chest pain, or  hemoptysis.  He reports he has a intermittent mild cough which he rated a 3-4/10. He does not take anything for cough. He sometimes has clear/tan sputum. He denies any nausea, vomiting, diarrhea, or constipation.  He denies any headache or visual changes.  He denies any rashes or skin changes.  He follows closely with neuro oncology regarding his history of metastatic disease to the brain.  He recently had a follow-up appointment in January 2023 with Dr. Mickeal Andersen and is expected to have a repeat scan in April 2023.  They are monitoring him for now.  The patient is here today for evaluation and repeat blood work before starting cycle #25.   MEDICAL HISTORY: Past Medical History:  Diagnosis Date   Asthma    as a child   Chronic pain disorder    LT leg and foot   Heart murmur    as a child   Hypertension    Malignant neoplasm of upper lobe of right lung ([place])    Stroke ([place])    mini stroke - 2015, some tingling in fingers on right     ALLERGIES:  has No Known Allergies.  MEDICATIONS:  Current Outpatient Medications  Medication Sig Dispense Refill   amLODipine (NORVASC) 10 MG tablet Take 10 mg by mouth daily.     Cholecalciferol 100 MCG (4000 UT) TABS Take 2 tablets by mouth daily.     diclofenac Sodium (VOLTAREN) 1 % GEL Apply 4 g topically  3 (three) times daily.     HYDROcodone-acetaminophen (NORCO) 10-325 MG tablet Take 1 tablet by mouth 2 (two) times daily.     oxybutynin (DITROPAN-XL) 10 MG 24 hr tablet Take by mouth.     polyethylene glycol powder (GLYCOLAX/MIRALAX) 17 GM/SCOOP powder      prochlorperazine (COMPAZINE) 10 MG tablet Take 1 tablet (10 mg total) by mouth every 6 (six) hours as needed. 30 tablet 2   rivaroxaban (XARELTO) 20 MG TABS tablet TAKE 1 TABLET BY MOUTH ONCE A DAY WITH SUPPER 30 tablet 2   senna-docusate (SENOKOT-S) 8.6-50 MG tablet Take 2 tablets by mouth daily.     tadalafil (CIALIS) 20 MG tablet Take by mouth.     tamsulosin (FLOMAX) 0.4 MG CAPS capsule Take 1  capsule (0.4 mg total) by mouth daily. 30 capsule 2   doxylamine, Sleep, (UNISOM) 25 MG tablet Take 1 tablet (25 mg total) by mouth at bedtime as needed for sleep. 30 tablet 0   No current facility-administered medications for this visit.    SURGICAL HISTORY:  Past Surgical History:  Procedure Laterality Date   BUNIONECTOMY Left    had hammer toe surgery also and callus removed   BUNIONECTOMY Right    also had hammer toe surgery and callus removed   LEG SURGERY Left    PT reports he has pins placed in his Lt leg   ORIF TIBIA PLATEAU  10/09/2012   Dr Randy Andersen   ORIF TIBIA PLATEAU Left 10/08/2012   Procedure: OPEN REDUCTION INTERNAL FIXATION (ORIF) TIBIAL PLATEAU;  Surgeon: Randy Killings, MD;  Location: [facility];  Service: Orthopedics;  Laterality: Left;   VIDEO BRONCHOSCOPY WITH ENDOBRONCHIAL NAVIGATION N/A 02/25/2020   Procedure: VIDEO BRONCHOSCOPY WITH ENDOBRONCHIAL NAVIGATION with biopsies;  Surgeon: Randy Gobble, MD;  Location: [facility];  Service: Thoracic;  Laterality: N/A;   VIDEO BRONCHOSCOPY WITH ENDOBRONCHIAL ULTRASOUND N/A 02/25/2020   Procedure: VIDEO BRONCHOSCOPY WITH ENDOBRONCHIAL ULTRASOUND;  Surgeon: Randy Gobble, MD;  Location: [facility];  Service: Thoracic;  Laterality: N/A;    REVIEW OF SYSTEMS:   Review of Systems  Constitutional: Negative for appetite change, chills, fatigue, fever and unexpected weight change.  HENT: Negative for mouth sores, nosebleeds, sore throat and trouble swallowing.   Eyes: Negative for eye problems and icterus.  Respiratory: Positive for intermittent cough. Negative for hemoptysis, shortness of breath and wheezing.   Cardiovascular: Negative for chest pain and leg swelling.  Gastrointestinal: Negative for abdominal pain, constipation, diarrhea, nausea and vomiting.  Genitourinary: Negative for bladder incontinence, difficulty urinating, dysuria, frequency and hematuria.   Musculoskeletal: Negative for back pain, gait problem, neck pain and neck  stiffness.  Skin: Negative for itching and rash.  Neurological: Negative for dizziness, extremity weakness, gait problem, headaches, light-headedness and seizures.  Hematological: Negative for adenopathy. Does not bruise/bleed easily.  Psychiatric/Behavioral: Negative for confusion, depression and sleep disturbance. The patient is not nervous/anxious.     PHYSICAL EXAMINATION:  Blood pressure (!) 157/90, pulse 85, temperature (!) 97.5 F (36.4 C), temperature source Tympanic, resp. rate 17, weight 186 lb 12.8 oz (84.7 kg), SpO2 99 %.  ECOG PERFORMANCE STATUS: 1  Physical Exam  Constitutional: Oriented to person, place, and time and well-developed, well-nourished, and in no distress.  HENT:  Head: Normocephalic and atraumatic.  Mouth/Throat: Oropharynx is clear and moist. No oropharyngeal exudate.  Eyes: Conjunctivae are normal. Right eye exhibits no discharge. Left eye exhibits no discharge. No scleral icterus.  Neck: Normal range of motion. Neck  supple.  Cardiovascular: Normal rate, regular rhythm, normal heart sounds and intact distal pulses.   Pulmonary/Chest: Effort normal and breath sounds normal. No respiratory distress. No wheezes. No rales.  Abdominal: Soft. Bowel sounds are normal. Exhibits no distension and no mass. There is no tenderness.  Musculoskeletal: Normal range of motion. Exhibits no edema.  Lymphadenopathy:    No cervical adenopathy.  Neurological: Alert and oriented to person, place, and time. Exhibits normal muscle tone. Gait normal. Coordination normal.  Skin: Skin is warm and dry. No rash noted. Not diaphoretic. No erythema. No pallor.  Psychiatric: Mood, memory and judgment normal.  Vitals reviewed.  LABORATORY DATA: Lab Results  Component Value Date   WBC 5.5 08/31/2021   HGB 14.2 08/31/2021   HCT 41.4 08/31/2021   MCV 84.7 08/31/2021   PLT 194 08/31/2021      Chemistry      Component Value Date/Time   NA 138 08/31/2021 1345   K 3.6 08/31/2021  1345   CL 108 08/31/2021 1345   CO2 23 08/31/2021 1345   BUN 17 08/31/2021 1345   CREATININE 1.49 (H) 08/31/2021 1345      Component Value Date/Time   CALCIUM 9.8 08/31/2021 1345   ALKPHOS 127 (H) 08/31/2021 1345   AST 11 (L) 08/31/2021 1345   ALT 10 08/31/2021 1345   BILITOT 0.3 08/31/2021 1345       RADIOGRAPHIC STUDIES:  MR BRAIN W WO CONTRAST  Result Date: 08/03/2021 CLINICAL DATA:  Lung cancer, brain metastases, assess treatment response EXAM: MRI HEAD WITHOUT AND WITH CONTRAST TECHNIQUE: Multiplanar, multiecho pulse sequences of the brain and surrounding structures were obtained without and with intravenous contrast. CONTRAST:  21mL MULTIHANCE GADOBENATE DIMEGLUMINE 529 MG/ML IV SOLN COMPARISON:  02/18/2021 FINDINGS: Brain: Enhancing lesion (series 12, image 123) in the left precentral gyrus, which correlates with a punctate focus of restricted diffusion (series 7, image 78) and increased T2 signal (series 8, image 41); the lesion measures up to 4 mm, previously 2 mm. This correlates with the site of a previously noted enhancing lesion, last seen to enhance on 03/13/2020. No other enhancing lesions are seen. No other restricted diffusion to suggest acute or subacute infarct. No acute hemorrhage, mass effect, or midline shift. T2 hyperintense signal in the periventricular white matter, likely the sequela of chronic small vessel ischemic disease. Bilateral basal ganglia lacunar infarcts. Vascular: Normal flow voids. Skull and upper cervical spine: Normal marrow signal. Sinuses/Orbits: Negative. Other: The mastoids are well aerated. IMPRESSION: Enhancing 4 mm lesion in the left precentral gyrus with associated edema, most likely a metastatic lesion, which correlates with the location of a previously treated metastatic lesion, last seen to enhance in August 2021. No other abnormal enhancement. Electronically Signed   By: Merilyn Baba M.D.   On: 08/03/2021 15:14     ASSESSMENT/PLAN:  This  is a very pleasant 66 year old African-American male with stage IV non-small cell lung cancer, favored to be adenocarcinoma.  He presented with posterior right upper lobe nodule as well as right hilar, infrahilar, subcarinal, and right paratracheal lymphadenopathy.  He also has a 1.4 cm x 1 cm soft tissue nodule in the skin/subcutaneous fat in the left axilla.  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  He was diagnosed in August 2021.  Molecular studies by [facility] are negative for any actionable mutations.  The patient completed SRS to the small metastatic brain lesion under the care of Dr. Lisbeth Andersen on 03/18/20  The patient is currently undergoing palliative systemic chemotherapy with carboplatin for an AUC of 5, Alimta 500 mg per metered squared, and Keytruda 200 mg IV every 3 weeks.  He is status post 24 cycles. The dose of alimta was reduced to 400 mg/m2 due to renal insufficiency. Alimta was ultimately removed from the treatment plan with cycle #11 due to renal insufficiency.  The patient completed SBRT to the enlarging right upper lobe lung mass under the care of Dr. Lisbeth Andersen on 04/06/21.   Labs were reviewed.  Recommend that he proceed with cycle #25 today scheduled.  We will see him back for follow-up visit in 3 weeks for evaluation before starting cycle #26.  I will arrange for restaging CT scan the chest, abdomen, and pelvis prior to starting his next cycle of treatment.  He will continue to follow closely with neuro oncology for his history of metastatic disease to the brain.  The patient was advised to call immediately if he has any concerning symptoms in the interval. The patient voices understanding of current disease status and treatment options and is in agreement with the current care plan. All questions were answered. The patient knows to call the clinic with any problems, questions or concerns. We can certainly see the patient much sooner if  necessary         Orders Placed This Encounter  Procedures   CT Chest Wo Contrast    Standing Status:   Future    Standing Expiration Date:   08/31/2022    Order Specific Question:   Preferred imaging location?    Answer:   [facility]   CT Abdomen Pelvis Wo Contrast    Standing Status:   Future    Standing Expiration Date:   08/31/2022    Order Specific Question:   Preferred imaging location?    Answer:   [facility]    Order Specific Question:   Is Oral Contrast requested for this exam?    Answer:   Yes, Per Radiology protocol     The total time spent in the appointment was 20-29 minutes.   Kennard Fildes L Channelle Bottger, PA-C 08/31/21

## 2021-08-31 ENCOUNTER — Other Ambulatory Visit: Payer: Self-pay

## 2021-08-31 ENCOUNTER — Inpatient Hospital Stay: Payer: No Typology Code available for payment source

## 2021-08-31 ENCOUNTER — Inpatient Hospital Stay: Payer: No Typology Code available for payment source | Attending: Internal Medicine

## 2021-08-31 ENCOUNTER — Inpatient Hospital Stay (HOSPITAL_BASED_OUTPATIENT_CLINIC_OR_DEPARTMENT_OTHER): Payer: No Typology Code available for payment source | Admitting: Physician Assistant

## 2021-08-31 ENCOUNTER — Other Ambulatory Visit (HOSPITAL_COMMUNITY): Payer: Self-pay

## 2021-08-31 ENCOUNTER — Encounter: Payer: Self-pay | Admitting: Physician Assistant

## 2021-08-31 VITALS — BP 157/90 | HR 85 | Temp 97.5°F | Resp 17 | Wt 186.8 lb

## 2021-08-31 DIAGNOSIS — Z5112 Encounter for antineoplastic immunotherapy: Secondary | ICD-10-CM

## 2021-08-31 DIAGNOSIS — C3411 Malignant neoplasm of upper lobe, right bronchus or lung: Secondary | ICD-10-CM

## 2021-08-31 DIAGNOSIS — C7931 Secondary malignant neoplasm of brain: Secondary | ICD-10-CM

## 2021-08-31 DIAGNOSIS — Z79899 Other long term (current) drug therapy: Secondary | ICD-10-CM | POA: Diagnosis not present

## 2021-08-31 LAB — CBC WITH DIFFERENTIAL (CANCER CENTER ONLY)
Abs Immature Granulocytes: 0.02 10*3/uL (ref 0.00–0.07)
Basophils Absolute: 0.1 10*3/uL (ref 0.0–0.1)
Basophils Relative: 1 %
Eosinophils Absolute: 0.1 10*3/uL (ref 0.0–0.5)
Eosinophils Relative: 2 %
HCT: 41.4 % (ref 39.0–52.0)
Hemoglobin: 14.2 g/dL (ref 13.0–17.0)
Immature Granulocytes: 0 %
Lymphocytes Relative: 21 %
Lymphs Abs: 1.2 10*3/uL (ref 0.7–4.0)
MCH: 29 pg (ref 26.0–34.0)
MCHC: 34.3 g/dL (ref 30.0–36.0)
MCV: 84.7 fL (ref 80.0–100.0)
Monocytes Absolute: 0.7 10*3/uL (ref 0.1–1.0)
Monocytes Relative: 13 %
Neutro Abs: 3.4 10*3/uL (ref 1.7–7.7)
Neutrophils Relative %: 63 %
Platelet Count: 194 10*3/uL (ref 150–400)
RBC: 4.89 MIL/uL (ref 4.22–5.81)
RDW: 12.7 % (ref 11.5–15.5)
WBC Count: 5.5 10*3/uL (ref 4.0–10.5)
nRBC: 0 % (ref 0.0–0.2)

## 2021-08-31 LAB — CMP (CANCER CENTER ONLY)
ALT: 10 U/L (ref 0–44)
AST: 11 U/L — ABNORMAL LOW (ref 15–41)
Albumin: 4.1 g/dL (ref 3.5–5.0)
Alkaline Phosphatase: 127 U/L — ABNORMAL HIGH (ref 38–126)
Anion gap: 7 (ref 5–15)
BUN: 17 mg/dL (ref 8–23)
CO2: 23 mmol/L (ref 22–32)
Calcium: 9.8 mg/dL (ref 8.9–10.3)
Chloride: 108 mmol/L (ref 98–111)
Creatinine: 1.49 mg/dL — ABNORMAL HIGH (ref 0.61–1.24)
GFR, Estimated: 51 mL/min — ABNORMAL LOW (ref >60)
Glucose, Bld: 117 mg/dL — ABNORMAL HIGH (ref 70–99)
Potassium: 3.6 mmol/L (ref 3.5–5.1)
Sodium: 138 mmol/L (ref 135–145)
Total Bilirubin: 0.3 mg/dL (ref 0.3–1.2)
Total Protein: 7.7 g/dL (ref 6.5–8.1)

## 2021-08-31 LAB — TSH: TSH: 1.895 u[IU]/mL (ref 0.320–4.118)

## 2021-08-31 MED ORDER — SODIUM CHLORIDE 0.9 % IV SOLN
200.0000 mg | Freq: Once | INTRAVENOUS | Status: AC
  Administered 2021-08-31: 200 mg via INTRAVENOUS
  Filled 2021-08-31: qty 200

## 2021-08-31 MED ORDER — DOXYLAMINE SUCCINATE (SLEEP) 25 MG PO TABS
25.0000 mg | ORAL_TABLET | Freq: Every evening | ORAL | 0 refills | Status: DC | PRN
  Filled 2021-08-31: qty 30, 30d supply, fill #0

## 2021-08-31 MED ORDER — SODIUM CHLORIDE 0.9 % IV SOLN: Freq: Once | INTRAVENOUS | Status: AC

## 2021-08-31 NOTE — Patient Instructions (Signed)
[facility]  Discharge Instructions: ?Thank you for choosing [facility] to provide your oncology and hematology care.  ? ?If you have a lab appointment with the Cancer Center, please go directly to the Cancer Center and check in at the registration area. ?  ?Wear comfortable clothing and clothing appropriate for easy access to any Portacath or PICC line.  ? ?We strive to give you quality time with your provider. You may need to reschedule your appointment if you arrive late (15 or more minutes).  Arriving late affects you and other patients whose appointments are after yours.  Also, if you miss three or more appointments without notifying the office, you may be dismissed from the clinic at the provider?s discretion.    ?  ?For prescription refill requests, have your pharmacy contact our office and allow 72 hours for refills to be completed.   ? ?Today you received the following chemotherapy and/or immunotherapy agents: Keytruda ?  ?To help prevent nausea and vomiting after your treatment, we encourage you to take your nausea medication as directed. ? ?BELOW ARE SYMPTOMS THAT SHOULD BE REPORTED IMMEDIATELY: ?*FEVER GREATER THAN 100.4 F (38 ?C) OR HIGHER ?*CHILLS OR SWEATING ?*NAUSEA AND VOMITING THAT IS NOT CONTROLLED WITH YOUR NAUSEA MEDICATION ?*UNUSUAL SHORTNESS OF BREATH ?*UNUSUAL BRUISING OR BLEEDING ?*URINARY PROBLEMS (pain or burning when urinating, or frequent urination) ?*BOWEL PROBLEMS (unusual diarrhea, constipation, pain near the anus) ?TENDERNESS IN MOUTH AND THROAT WITH OR WITHOUT PRESENCE OF ULCERS (sore throat, sores in mouth, or a toothache) ?UNUSUAL RASH, SWELLING OR PAIN  ?UNUSUAL VAGINAL DISCHARGE OR ITCHING  ? ?Items with * indicate a potential emergency and should be followed up as soon as possible or go to the Emergency Department if any problems should occur. ? ?Please show the CHEMOTHERAPY ALERT CARD or IMMUNOTHERAPY ALERT CARD at check-in to the  Emergency Department and triage nurse. ? ?Should you have questions after your visit or need to cancel or reschedule your appointment, please contact [facility]  Dept: [phone]  and follow the prompts.  Office hours are 8:00 a.m. to 4:30 p.m. Monday - Friday. Please note that voicemails left after 4:00 p.m. may not be returned until the following business day.  We are closed weekends and major holidays. You have access to a nurse at all times for urgent questions. Please call the main number to the clinic Dept: [phone] and follow the prompts. ? ? ?For any non-urgent questions, you may also contact your provider using MyChart. We now offer e-Visits for anyone 18 and older to request care online for non-urgent symptoms. For details visit [email or web address]. ?  ?Also download the MyChart app! Go to the app store, search "MyChart", open the app, select [facility], and log in with your MyChart username and password. ? ?Due to Covid, a mask is required upon entering the hospital/clinic. If you do not have a mask, one will be given to you upon arrival. For doctor visits, patients may have 1 support person aged 18 or older with them. For treatment visits, patients cannot have anyone with them due to current Covid guidelines and our immunocompromised population.  ? ?

## 2021-09-02 ENCOUNTER — Telehealth: Payer: Self-pay | Admitting: Internal Medicine

## 2021-09-02 NOTE — Telephone Encounter (Signed)
Scheduled per 02/08 los, patient has been called and notified of upcoming appointments.

## 2021-09-06 ENCOUNTER — Encounter: Payer: Self-pay | Admitting: Internal Medicine

## 2021-09-16 ENCOUNTER — Other Ambulatory Visit: Payer: Self-pay

## 2021-09-16 ENCOUNTER — Ambulatory Visit (HOSPITAL_COMMUNITY)
Admission: RE | Admit: 2021-09-16 | Discharge: 2021-09-16 | Disposition: A | Discharge status: Home / Self Care | Payer: Medicare PPO | Source: Ambulatory Visit | Attending: Physician Assistant | Admitting: Physician Assistant

## 2021-09-16 DIAGNOSIS — C3411 Malignant neoplasm of upper lobe, right bronchus or lung: Secondary | ICD-10-CM | POA: Diagnosis not present

## 2021-09-16 DIAGNOSIS — C3490 Malignant neoplasm of unspecified part of unspecified bronchus or lung: Secondary | ICD-10-CM | POA: Diagnosis not present

## 2021-09-16 DIAGNOSIS — N400 Enlarged prostate without lower urinary tract symptoms: Secondary | ICD-10-CM | POA: Diagnosis not present

## 2021-09-16 DIAGNOSIS — N2889 Other specified disorders of kidney and ureter: Secondary | ICD-10-CM | POA: Diagnosis not present

## 2021-09-16 IMAGING — CT CT CHEST W/O CM
2 of 4 series · 14 of 36 positions shown, 17 images · non-contrast
Comparison: Multiple exams, including [DATE]

CLINICAL DATA: Restaging of right upper lobe non-small cell lung
cancer with metastatic disease to the brain. Prior chemotherapy,
immunotherapy, and radiation therapy. Prior SBRT of the right upper
lobe lesion most recently [DATE]



[Series 3: thorax · axial · 0.73mm/px · z∈[-77,+213]mm · 11 of 173 slices shown, 14 images]
[im 14/173  mediastinal]
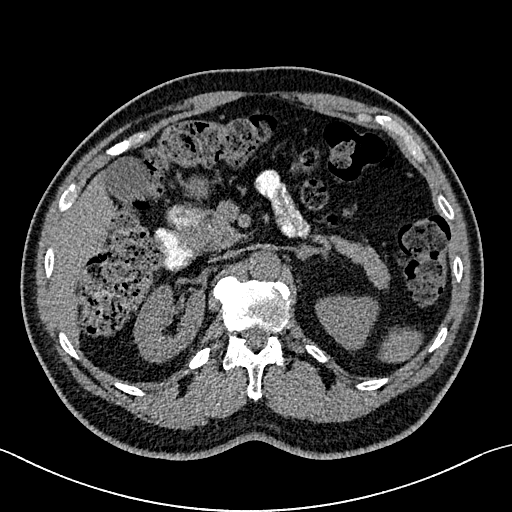
[im 14/173  lung]
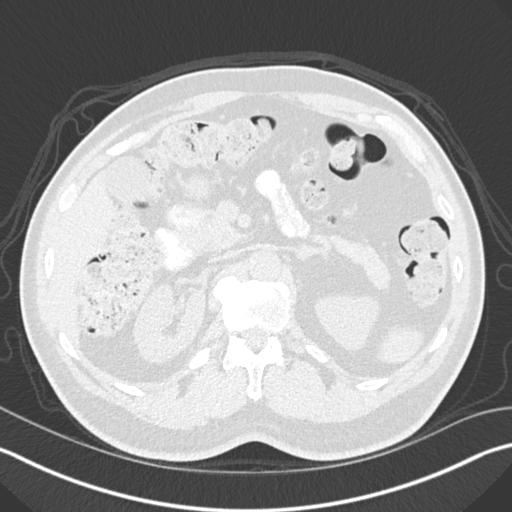
[im 27/173  lung]
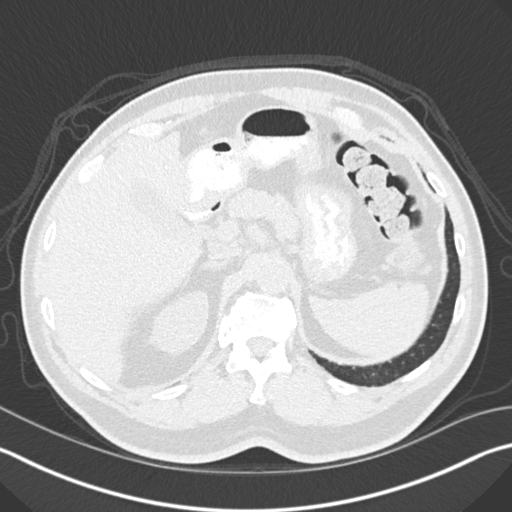
[im 40/173  lung]
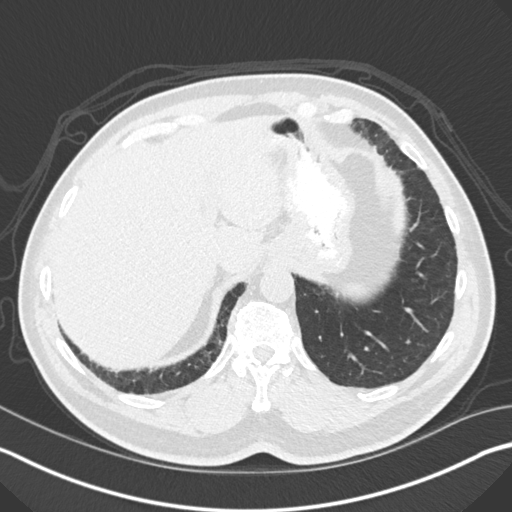
[im 53/173  lung]
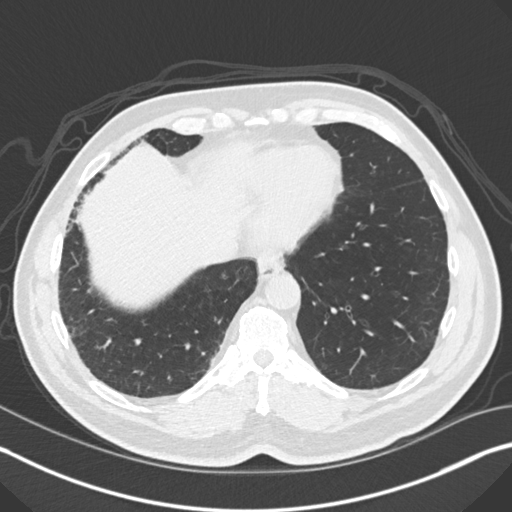
[im 67/173  mediastinal]
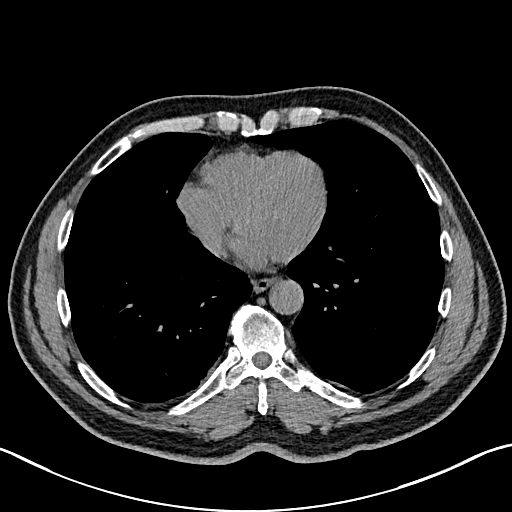
[im 67/173  lung]
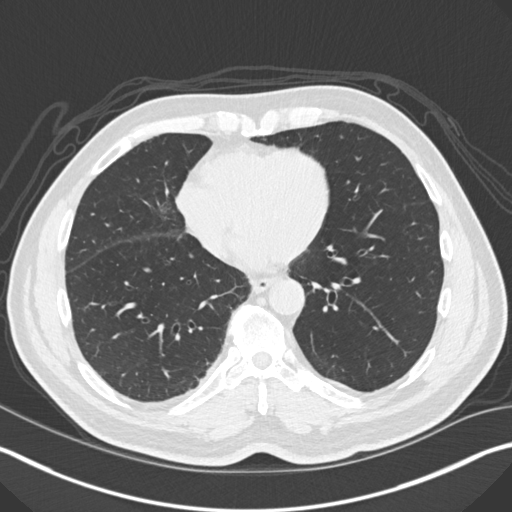
[im 93/173  lung]
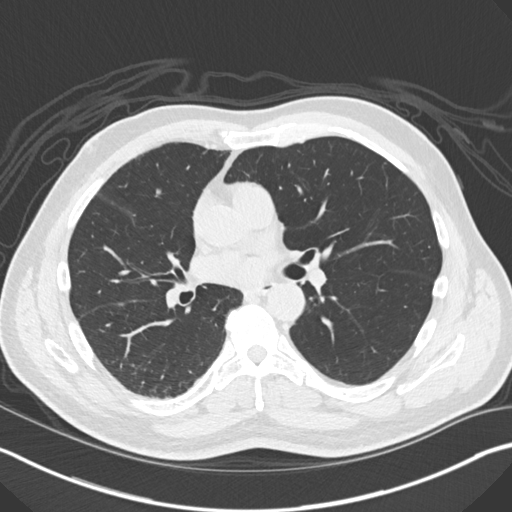
[im 106/173  lung]
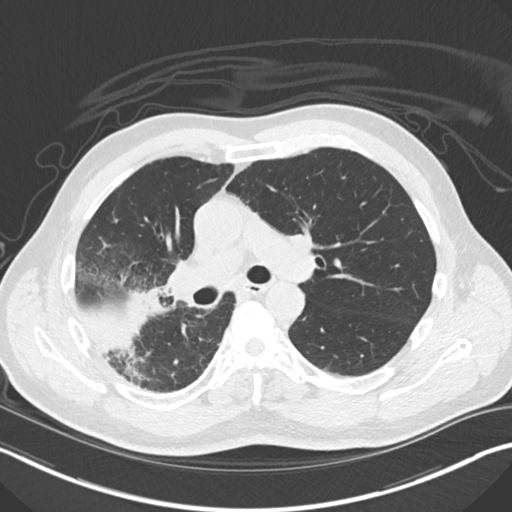
[im 120/173  lung]
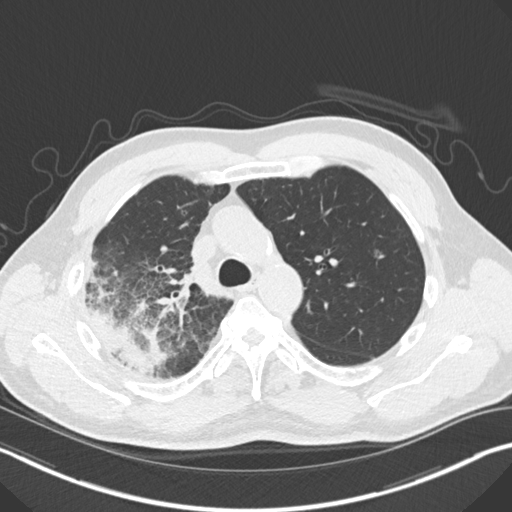
[im 133/173  mediastinal]
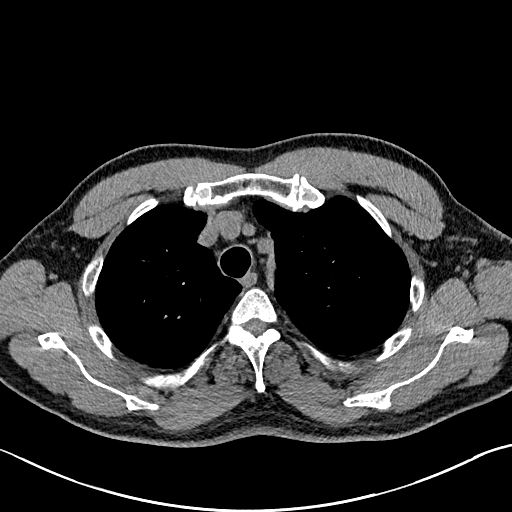
[im 133/173  lung]
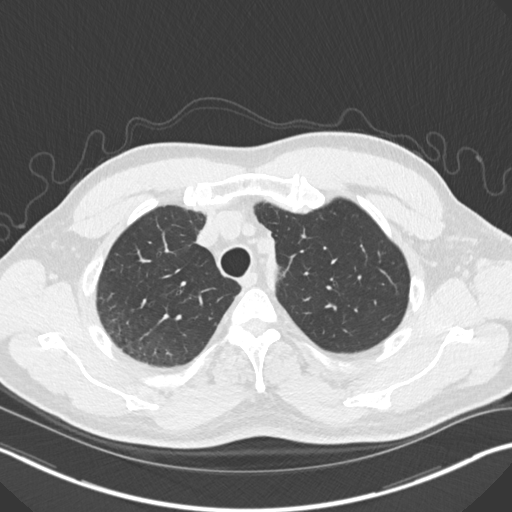
[im 146/173  lung]
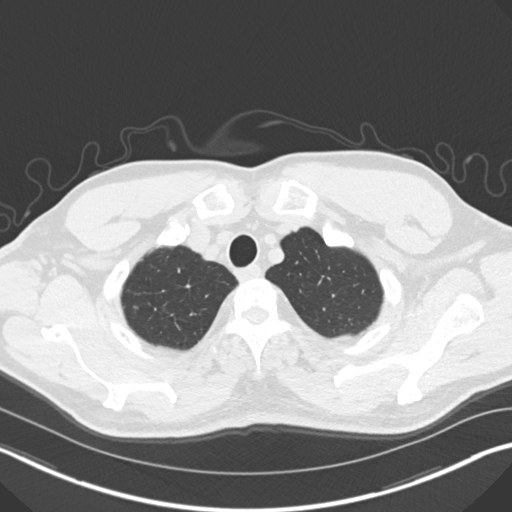
[im 159/173  lung]
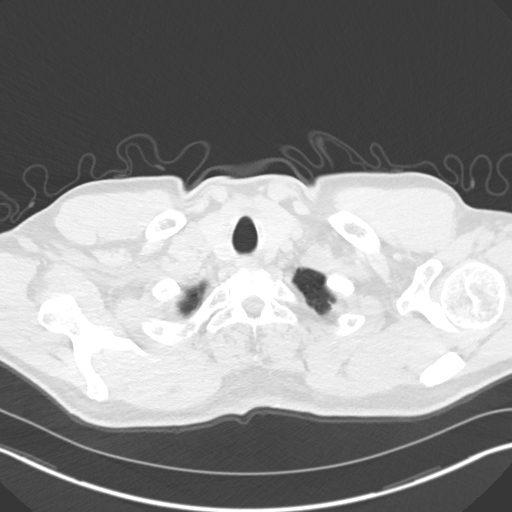

[Series 5: coronal · coronal · 0.67mm/px · 3 of 151 slices shown]
[im 31/151  lung]
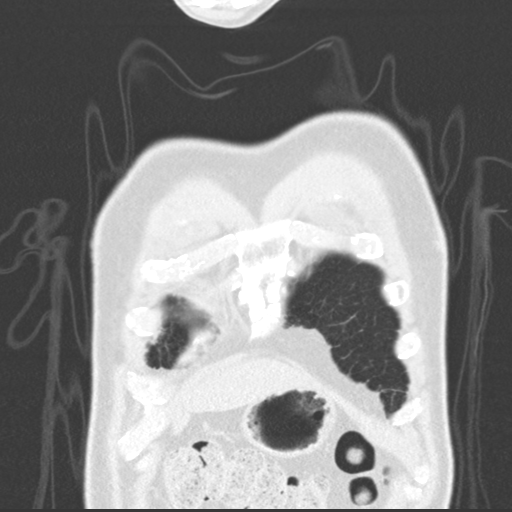
[im 61/151  lung]
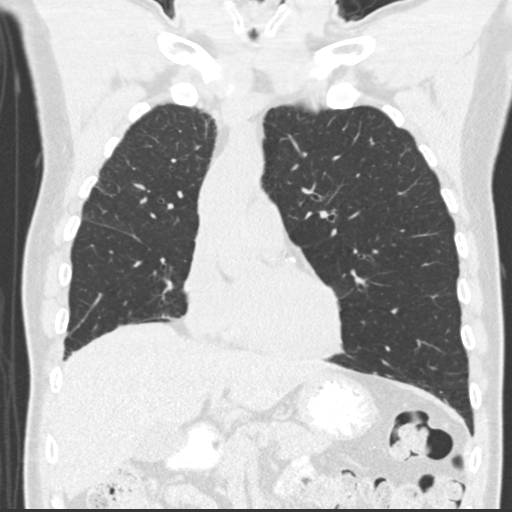
[im 91/151  lung]
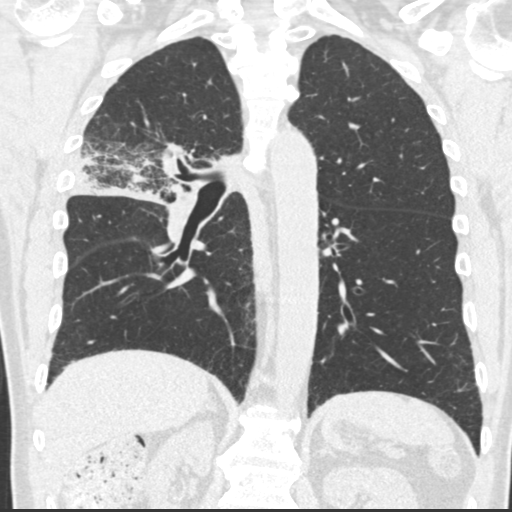

[14 of 36 positions shown; findings below may reference images not displayed]

FINDINGS: CT CHEST FINDINGS

Cardiovascular: Coronary, aortic arch, and branch vessel
atherosclerotic vascular disease.

Mediastinum/Nodes: Unremarkable

Lungs/Pleura: Substantially increased airspace opacity posteriorly
in the right upper lobe with surrounding interstitial accentuation
for example as shown on image 58 series 7, in the vicinity of the
previously treated tumor. There is also some indistinct interstitial
accentuation mild volume loss in the superior segment right lower
lobe. The distribution makes this suspicious for radiation
pneumonitis, although findings are substantially increased from
[DATE], which was already 3 months out from the last radiation
therapy. This would be an unusually delayed timeframe for radiation
pneumonitis, raising the possibility of pneumonia or other potential
cause. There is worsened truncation of the posterior segmental
bronchus of the right upper lobe for example on images 60-61 of
series [DATE] by 4 by 3 mm right upper lobe nodule on image 65 series 7,
formerly 6 by 2 by 2 mm.

Mild peripheral interstitial accentuation at the lung bases. 6 by 7
mm sub solid left upper lobe nodule on image 56 series 7, stable.

New sub solid 7 by 6 mm left upper lobe nodule on image 95 series 7.

Musculoskeletal: Postoperative findings in the mandible. Thoracic
spondylosis. There are 13 paired ribs, for numbering purposes I will
assume that the lowest rib-bearing vertebra is L1.

CT ABDOMEN PELVIS FINDINGS

Hepatobiliary: Unremarkable

Pancreas: Unremarkable

Spleen: Unremarkable

Adrenals/Urinary Tract: Stable 7 mm hypodense lesion of the right
kidney upper pole anteriorly on image 17 series 2 is technically too
small to characterize although statistically likely to be benign.
The kidneys and adrenal glands appear otherwise unremarkable.

Stomach/Bowel: Prominent stool throughout the colon favors
constipation.

Vascular/Lymphatic: Atherosclerosis is present, including aortoiliac
atherosclerotic disease.

Reproductive: Prostatomegaly.

Other: No supplemental non-categorized findings.

Musculoskeletal: Bridging spurring and partial fusion of both
sacroiliac joints. Disc bulge and degenerative facet arthropathy at
L5-S1 contributing to bilateral foraminal impingement.
IMPRESSION: 1. Substantially increased airspace opacity in the right upper lobe
surrounding the region of the lesion, with adjacent surrounding
interstitial accentuation in the right upper lobe and superior
segment right lower lobe. Although the appearance favors radiation
pneumonitis, the chronicity is somewhat unusual, as the airspace
opacity was not present on [DATE] (12 weeks after the final SBRT
appointment) but is currently substantial. This raises the
possibility of alternative diagnoses such as pneumonia, correlate
with the patient's clinical presentation. There is also substantial
narrowing of the posterior segmental bronchus of the right upper
lobe.
2. Mild increase in size of a currently 7 by 4 by 3 mm right upper
lobe pulmonary nodule.
3. Stable 7 mm sub solid left upper lobe nodule on image 56 series
7. New sub solid left upper lobe 7 by 6 mm nodule on image 95 series
7. Surveillance suggested.
4. Other imaging findings of potential clinical significance: Aortic
Atherosclerosis ([68]-[68]). Coronary atherosclerosis. Prominent
stool throughout the colon favors constipation. Bilateral foraminal
impingement at L5-S1 (numbering assumes that the lowest of 13 paired
ribs is at the L1 level).

* onc *

## 2021-09-16 IMAGING — CT CT ABD-PELV W/O CM
2 of 4 series · 14 of 46 positions shown, 16 images · non-contrast
Comparison: Multiple exams, including [DATE]

CLINICAL DATA: Restaging of right upper lobe non-small cell lung
cancer with metastatic disease to the brain. Prior chemotherapy,
immunotherapy, and radiation therapy. Prior SBRT of the right upper
lobe lesion most recently [DATE]



[Series 2: axial st · axial · 0.77mm/px · z∈[-381,+9]mm · 11 of 88 slices shown, 13 images]
[im 5/88  soft-tissue]
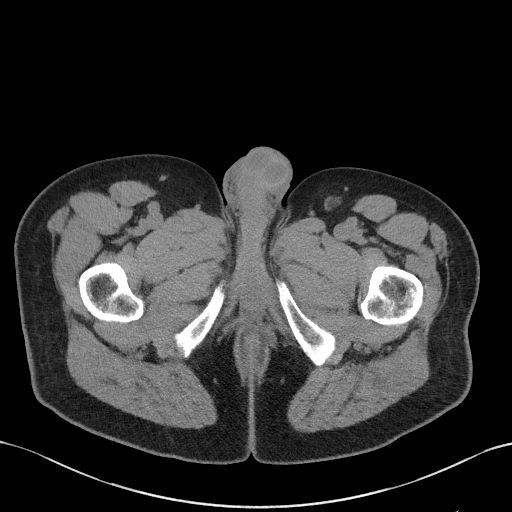
[im 5/88  bone]
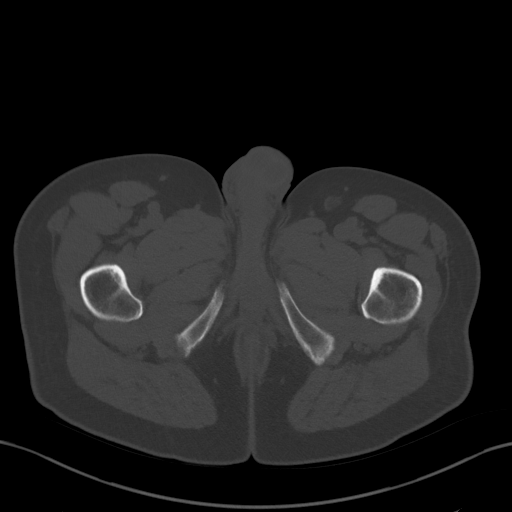
[im 15/88  soft-tissue]
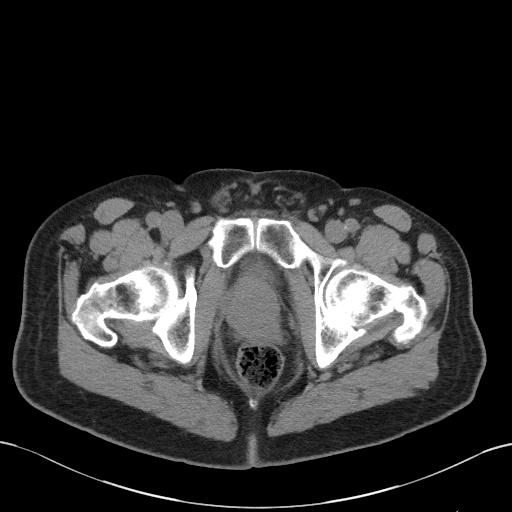
[im 20/88  soft-tissue]
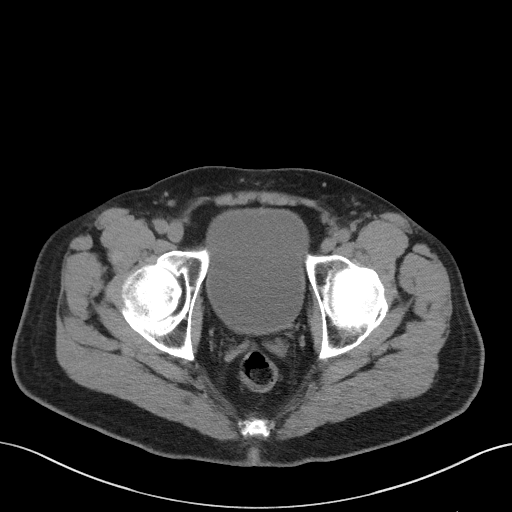
[im 30/88  soft-tissue]
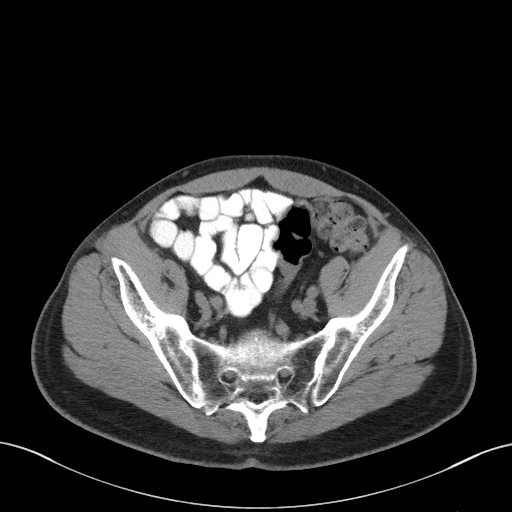
[im 34/88  soft-tissue]
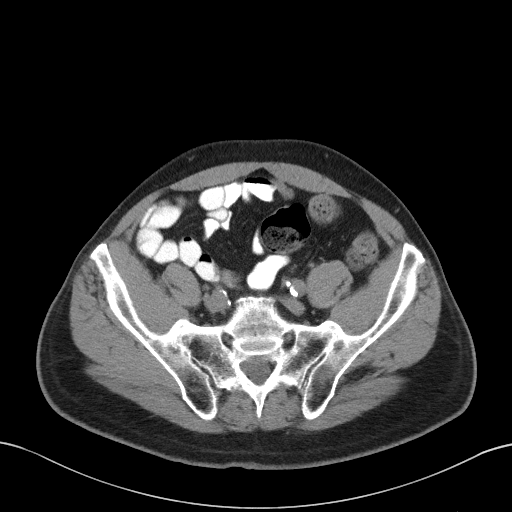
[im 44/88  soft-tissue]
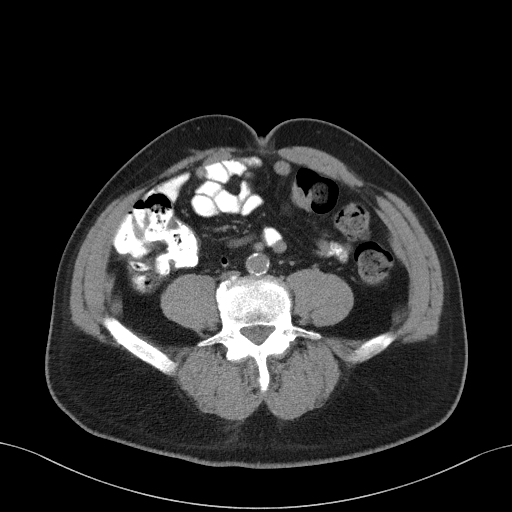
[im 54/88  soft-tissue]
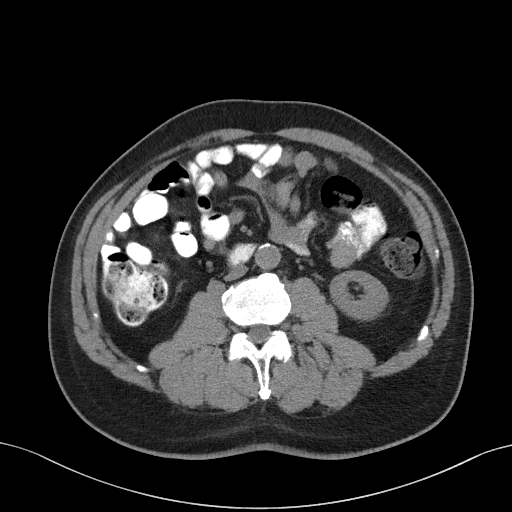
[im 59/88  soft-tissue]
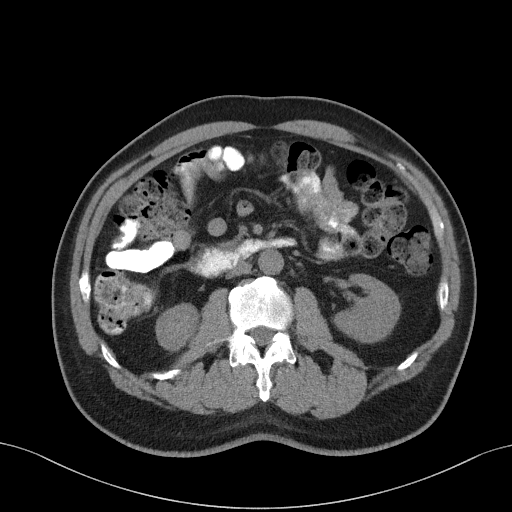
[im 68/88  soft-tissue]
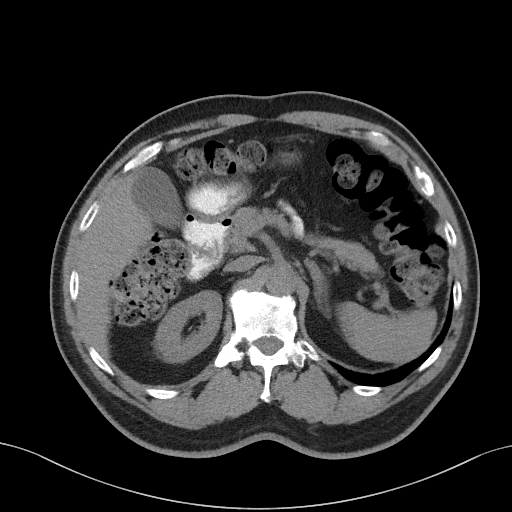
[im 68/88  bone]
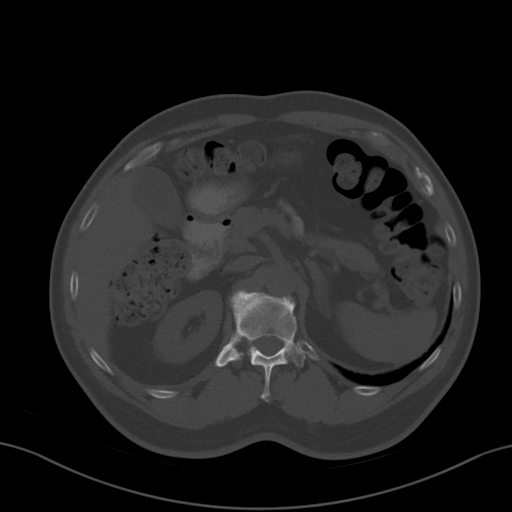
[im 73/88  soft-tissue]
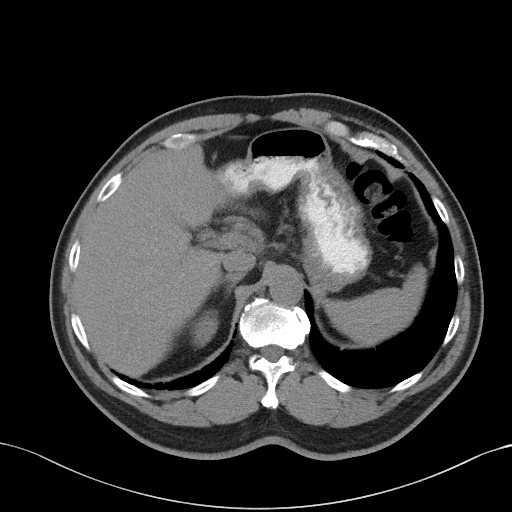
[im 83/88  soft-tissue]
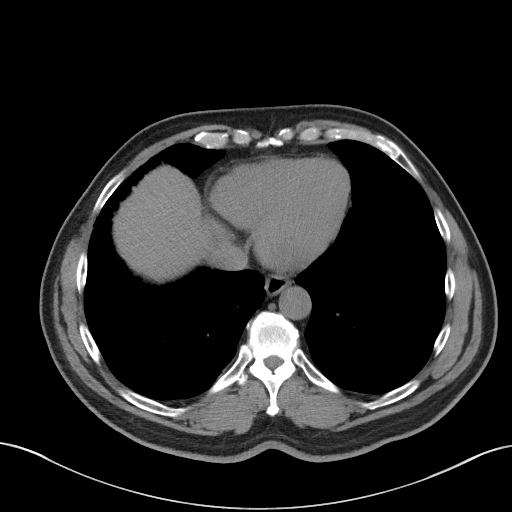

[Series 4: coronal st · coronal · 0.74mm/px · 3 of 101 slices shown]
[im 34/101  soft-tissue]
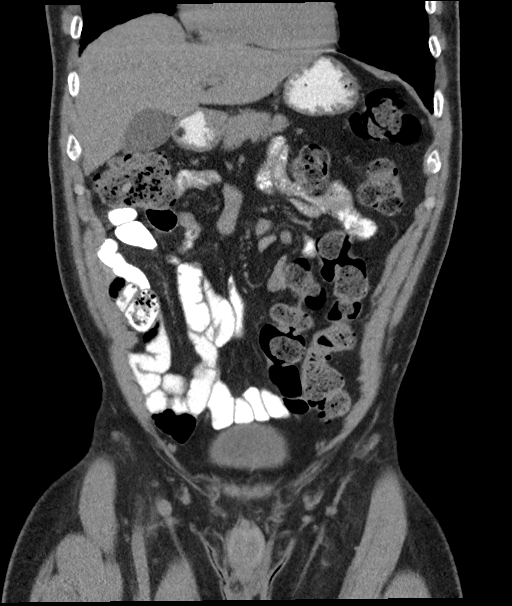
[im 45/101  soft-tissue]
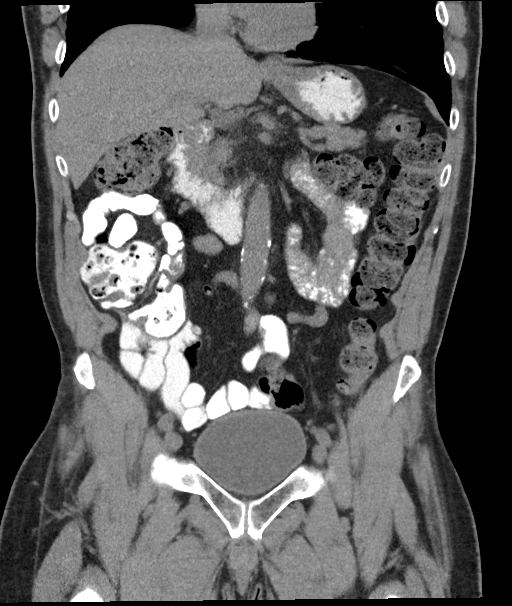
[im 56/101  soft-tissue]
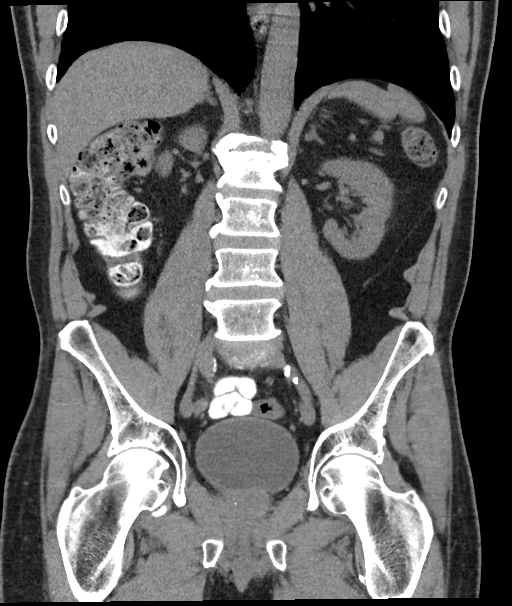

[14 of 46 positions shown; findings below may reference images not displayed]

FINDINGS: CT CHEST FINDINGS

Cardiovascular: Coronary, aortic arch, and branch vessel
atherosclerotic vascular disease.

Mediastinum/Nodes: Unremarkable

Lungs/Pleura: Substantially increased airspace opacity posteriorly
in the right upper lobe with surrounding interstitial accentuation
for example as shown on image 58 series 7, in the vicinity of the
previously treated tumor. There is also some indistinct interstitial
accentuation mild volume loss in the superior segment right lower
lobe. The distribution makes this suspicious for radiation
pneumonitis, although findings are substantially increased from
[DATE], which was already 3 months out from the last radiation
therapy. This would be an unusually delayed timeframe for radiation
pneumonitis, raising the possibility of pneumonia or other potential
cause. There is worsened truncation of the posterior segmental
bronchus of the right upper lobe for example on images 60-61 of
series [DATE] by 4 by 3 mm right upper lobe nodule on image 65 series 7,
formerly 6 by 2 by 2 mm.

Mild peripheral interstitial accentuation at the lung bases. 6 by 7
mm sub solid left upper lobe nodule on image 56 series 7, stable.

New sub solid 7 by 6 mm left upper lobe nodule on image 95 series 7.

Musculoskeletal: Postoperative findings in the mandible. Thoracic
spondylosis. There are 13 paired ribs, for numbering purposes I will
assume that the lowest rib-bearing vertebra is L1.

CT ABDOMEN PELVIS FINDINGS

Hepatobiliary: Unremarkable

Pancreas: Unremarkable

Spleen: Unremarkable

Adrenals/Urinary Tract: Stable 7 mm hypodense lesion of the right
kidney upper pole anteriorly on image 17 series 2 is technically too
small to characterize although statistically likely to be benign.
The kidneys and adrenal glands appear otherwise unremarkable.

Stomach/Bowel: Prominent stool throughout the colon favors
constipation.

Vascular/Lymphatic: Atherosclerosis is present, including aortoiliac
atherosclerotic disease.

Reproductive: Prostatomegaly.

Other: No supplemental non-categorized findings.

Musculoskeletal: Bridging spurring and partial fusion of both
sacroiliac joints. Disc bulge and degenerative facet arthropathy at
L5-S1 contributing to bilateral foraminal impingement.
IMPRESSION: 1. Substantially increased airspace opacity in the right upper lobe
surrounding the region of the lesion, with adjacent surrounding
interstitial accentuation in the right upper lobe and superior
segment right lower lobe. Although the appearance favors radiation
pneumonitis, the chronicity is somewhat unusual, as the airspace
opacity was not present on [DATE] (12 weeks after the final SBRT
appointment) but is currently substantial. This raises the
possibility of alternative diagnoses such as pneumonia, correlate
with the patient's clinical presentation. There is also substantial
narrowing of the posterior segmental bronchus of the right upper
lobe.
2. Mild increase in size of a currently 7 by 4 by 3 mm right upper
lobe pulmonary nodule.
3. Stable 7 mm sub solid left upper lobe nodule on image 56 series
7. New sub solid left upper lobe 7 by 6 mm nodule on image 95 series
7. Surveillance suggested.
4. Other imaging findings of potential clinical significance: Aortic
Atherosclerosis ([68]-[68]). Coronary atherosclerosis. Prominent
stool throughout the colon favors constipation. Bilateral foraminal
impingement at L5-S1 (numbering assumes that the lowest of 13 paired
ribs is at the L1 level).

* onc *

## 2021-09-16 MED ORDER — IOHEXOL 9 MG/ML PO SOLN
ORAL | Status: AC
  Filled 2021-09-16: qty 1000

## 2021-09-16 NOTE — Progress Notes (Signed)
[name] OFFICE PROGRESS NOTE  Randy Bears, MD [address] [place] [address]  DIAGNOSIS: Stage IV non-small cell lung cancer, favored to be adenocarcinoma.  He presented with posterior right upper lobe nodule as well as right hilar, infrahilar, subcarinal, and right paratracheal lymphadenopathy.  He also has a 1.4 cm x 1 cm soft tissue nodule in the skin/subcutaneous fat in the left axilla.  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  He was diagnosed in August 2021   Molecular Biomarkers: BIOMARKER(S)         % CFDNA OR AMPLIFICATION       ASSOCIATED FDA-APPROVED THERAPIES        CLINICAL TRIAL AVAILABILITY [id number] 2.9% None    Yes [id number] 0.4% None    Yes [id number] 0.2% None    Yes  PRIOR THERAPY: 1) SRS to the small metastatic brain lesion under the care of Dr. Lisbeth Andersen on 03/18/20 2) Radiation to the enlarging right upper lobe lung mass under the care of Dr. Lisbeth Andersen. Last dose on 04/06/21.   CURRENT THERAPY: Palliative systemic chemotherapy with carboplatin for an AUC of 5, Alimta 500 mg/m2, and Keytruda 200 mg IV every 3 weeks. First dose expected on 04/07/20. Status post 25 cycles. Starting from cycle #5 he will be on maintenance treatment with Alimta and Keytruda every 3 weeks. His dose of Alimta was reduced to 400 mg/m2. Alimta removed starting from cycle #11 due to renal insuffiencey.   INTERVAL HISTORY: Randy Andersen 66 y.o. male returns to the clinic today for a follow-up visit.  The patient is feeling well today without any concerning complaints.  He is currently undergoing single agent immunotherapy with Keytruda.  He is tolerating treatment well without any concerning adverse side effects.  Alimta was removed from his treatment plan due to renal insufficiency.   The patient denies any recent fever, chills, night sweats, or unexplained weight loss.  He denies any shortness of breath, chest pain, or hemoptysis.  He  reports he has a intermittent mild cough which produces beige phlegm. He reports the cough is similar to his baseline. He does not take anything for cough. He denies any nausea, vomiting, diarrhea, or constipation.  He denies any headache or visual changes.  He denies any rashes or skin changes.  He follows closely with neuro oncology regarding his history of metastatic disease to the brain.  He recently had a follow-up appointment in January 2023 with Dr. Mickeal Andersen and is expected to have a repeat scan in April 2023.  They are monitoring him for now.  The patient recently had a restaging CT scan performed.  The patient is here today for evaluation and to review his scan results before starting cycle #26.    MEDICAL HISTORY: Past Medical History:  Diagnosis Date   Asthma    as a child   Chronic pain disorder    LT leg and foot   Heart murmur    as a child   Hypertension    Malignant neoplasm of upper lobe of right lung ([facility])    Stroke ([facility])    mini stroke - 2015, some tingling in fingers on right     ALLERGIES:  has No Known Allergies.  MEDICATIONS:  Current Outpatient Medications  Medication Sig Dispense Refill   amLODipine (NORVASC) 10 MG tablet Take 10 mg by mouth daily.     Cholecalciferol 100 MCG (4000 UT) TABS Take 2 tablets by mouth  daily.     diclofenac Sodium (VOLTAREN) 1 % GEL Apply 4 g topically 3 (three) times daily.     doxylamine, Sleep, (UNISOM) 25 MG tablet Take 1 tablet (25 mg total) by mouth at bedtime as needed for sleep. 30 tablet 0   HYDROcodone-acetaminophen (NORCO) 10-325 MG tablet Take 1 tablet by mouth 2 (two) times daily.     oxybutynin (DITROPAN-XL) 10 MG 24 hr tablet Take by mouth.     polyethylene glycol powder (GLYCOLAX/MIRALAX) 17 GM/SCOOP powder      prochlorperazine (COMPAZINE) 10 MG tablet Take 1 tablet (10 mg total) by mouth every 6 (six) hours as needed. 30 tablet 2   rivaroxaban (XARELTO) 20 MG TABS tablet TAKE 1 TABLET BY MOUTH ONCE A DAY WITH SUPPER  30 tablet 2   senna-docusate (SENOKOT-S) 8.6-50 MG tablet Take 2 tablets by mouth daily.     tadalafil (CIALIS) 20 MG tablet Take by mouth.     tamsulosin (FLOMAX) 0.4 MG CAPS capsule Take 1 capsule (0.4 mg total) by mouth daily. 30 capsule 2   No current facility-administered medications for this visit.    SURGICAL HISTORY:  Past Surgical History:  Procedure Laterality Date   BUNIONECTOMY Left    had hammer toe surgery also and callus removed   BUNIONECTOMY Right    also had hammer toe surgery and callus removed   LEG SURGERY Left    PT reports he has pins placed in his Lt leg   ORIF TIBIA PLATEAU  10/09/2012   Dr Lorin Mercy   ORIF TIBIA PLATEAU Left 10/08/2012   Procedure: OPEN REDUCTION INTERNAL FIXATION (ORIF) TIBIAL PLATEAU;  Surgeon: Marybelle Killings, MD;  Location: [facility];  Service: Orthopedics;  Laterality: Left;   VIDEO BRONCHOSCOPY WITH ENDOBRONCHIAL NAVIGATION N/A 02/25/2020   Procedure: VIDEO BRONCHOSCOPY WITH ENDOBRONCHIAL NAVIGATION with biopsies;  Surgeon: Collene Gobble, MD;  Location: [facility];  Service: Thoracic;  Laterality: N/A;   VIDEO BRONCHOSCOPY WITH ENDOBRONCHIAL ULTRASOUND N/A 02/25/2020   Procedure: VIDEO BRONCHOSCOPY WITH ENDOBRONCHIAL ULTRASOUND;  Surgeon: Collene Gobble, MD;  Location: [facility];  Service: Thoracic;  Laterality: N/A;    REVIEW OF SYSTEMS:   Review of Systems  Constitutional: Negative for appetite change, chills, fatigue, fever and unexpected weight change.  HENT:   Negative for mouth sores, nosebleeds, sore throat and trouble swallowing.   Eyes: Negative for eye problems and icterus.  Respiratory: Positive for cough. Negative for hemoptysis, shortness of breath and wheezing.   Cardiovascular: Negative for chest pain and leg swelling.  Gastrointestinal: Negative for abdominal pain, constipation, diarrhea, nausea and vomiting.  Genitourinary: Negative for bladder incontinence, difficulty urinating, dysuria, frequency and hematuria.   Musculoskeletal:  Negative for back pain, gait problem, neck pain and neck stiffness.  Skin: Negative for itching and rash.  Neurological: Negative for dizziness, extremity weakness, gait problem, headaches, light-headedness and seizures.  Hematological: Negative for adenopathy. Does not bruise/bleed easily.  Psychiatric/Behavioral: Negative for confusion, depression and sleep disturbance. The patient is not nervous/anxious.     PHYSICAL EXAMINATION:  Blood pressure (!) 157/85, pulse 85, temperature 97.9 F (36.6 C), temperature source Tympanic, resp. rate 18, height 6\' 2"  (1.88 m), weight 188 lb 6.4 oz (85.5 kg), SpO2 100 %.  ECOG PERFORMANCE STATUS: 1  Physical Exam  Constitutional: Oriented to person, place, and time and well-developed, well-nourished, and in no distress. HENT:  Head: Normocephalic and atraumatic.  Mouth/Throat: Oropharynx is clear and moist. No oropharyngeal exudate.  Eyes: Conjunctivae are normal. Right  eye exhibits no discharge. Left eye exhibits no discharge. No scleral icterus.  Neck: Normal range of motion. Neck supple.  Cardiovascular: Normal rate, regular rhythm, normal heart sounds and intact distal pulses.   Pulmonary/Chest: Effort normal and breath sounds normal. No respiratory distress. No wheezes. No rales.  Abdominal: Soft. Bowel sounds are normal. Exhibits no distension and no mass. There is no tenderness.  Musculoskeletal: Normal range of motion. Exhibits no edema.  Lymphadenopathy:    No cervical adenopathy.  Neurological: Alert and oriented to person, place, and time. Exhibits normal muscle tone. Gait normal. Coordination normal.  Skin: Skin is warm and dry. No rash noted. Not diaphoretic. No erythema. No pallor.  Psychiatric: Mood, memory and judgment normal.  Vitals reviewed.  LABORATORY DATA: Lab Results  Component Value Date   WBC 5.8 09/20/2021   HGB 13.7 09/20/2021   HCT 41.4 09/20/2021   MCV 86.4 09/20/2021   PLT 219 09/20/2021      Chemistry       Component Value Date/Time   NA 136 09/20/2021 1332   K 4.1 09/20/2021 1332   CL 106 09/20/2021 1332   CO2 23 09/20/2021 1332   BUN 15 09/20/2021 1332   CREATININE 1.57 (H) 09/20/2021 1332      Component Value Date/Time   CALCIUM 9.5 09/20/2021 1332   ALKPHOS 124 09/20/2021 1332   AST 11 (L) 09/20/2021 1332   ALT 8 09/20/2021 1332   BILITOT 0.5 09/20/2021 1332       RADIOGRAPHIC STUDIES:  CT Abdomen Pelvis Wo Contrast  Result Date: 09/19/2021 CLINICAL DATA:  Restaging of right upper lobe non-small cell lung cancer with metastatic disease to the brain. Prior chemotherapy, immunotherapy, and radiation therapy. Prior SBRT of the right upper lobe lesion most recently 04/06/2021 EXAM: CT CHEST, ABDOMEN AND PELVIS WITHOUT CONTRAST TECHNIQUE: Multidetector CT imaging of the chest, abdomen and pelvis was performed following the standard protocol without IV contrast. RADIATION DOSE REDUCTION: This exam was performed according to the departmental dose-optimization program which includes automated exposure control, adjustment of the mA and/or kV according to patient size and/or use of iterative reconstruction technique. COMPARISON:  Multiple exams, including 07/05/2021 FINDINGS: CT CHEST FINDINGS Cardiovascular: Coronary, aortic arch, and branch vessel atherosclerotic vascular disease. Mediastinum/Nodes: Unremarkable Lungs/Pleura: Substantially increased airspace opacity posteriorly in the right upper lobe with surrounding interstitial accentuation for example as shown on image 58 series 7, in the vicinity of the previously treated tumor. There is also some indistinct interstitial accentuation mild volume loss in the superior segment right lower lobe. The distribution makes this suspicious for radiation pneumonitis, although findings are substantially increased from 07/05/2021, which was already 3 months out from the last radiation therapy. This would be an unusually delayed timeframe for radiation  pneumonitis, raising the possibility of pneumonia or other potential cause. There is worsened truncation of the posterior segmental bronchus of the right upper lobe for example on images 60-61 of series 7. 7 by 4 by 3 mm right upper lobe nodule on image 65 series 7, formerly 6 by 2 by 2 mm. Mild peripheral interstitial accentuation at the lung bases. 6 by 7 mm sub solid left upper lobe nodule on image 56 series 7, stable. New sub solid 7 by 6 mm left upper lobe nodule on image 95 series 7. Musculoskeletal: Postoperative findings in the mandible. Thoracic spondylosis. There are 13 paired ribs, for numbering purposes I will assume that the lowest rib-bearing vertebra is L1. CT ABDOMEN PELVIS FINDINGS Hepatobiliary: Unremarkable Pancreas:  Unremarkable Spleen: Unremarkable Adrenals/Urinary Tract: Stable 7 mm hypodense lesion of the right kidney upper pole anteriorly on image 17 series 2 is technically too small to characterize although statistically likely to be benign. The kidneys and adrenal glands appear otherwise unremarkable. Stomach/Bowel: Prominent stool throughout the colon favors constipation. Vascular/Lymphatic: Atherosclerosis is present, including aortoiliac atherosclerotic disease. Reproductive: Prostatomegaly. Other: No supplemental non-categorized findings. Musculoskeletal: Bridging spurring and partial fusion of both sacroiliac joints. Disc bulge and degenerative facet arthropathy at L5-S1 contributing to bilateral foraminal impingement. IMPRESSION: 1. Substantially increased airspace opacity in the right upper lobe surrounding the region of the lesion, with adjacent surrounding interstitial accentuation in the right upper lobe and superior segment right lower lobe. Although the appearance favors radiation pneumonitis, the chronicity is somewhat unusual, as the airspace opacity was not present on 07/05/2021 (12 weeks after the final SBRT appointment) but is currently substantial. This raises the  possibility of alternative diagnoses such as pneumonia, correlate with the patient's clinical presentation. There is also substantial narrowing of the posterior segmental bronchus of the right upper lobe. 2. Mild increase in size of a currently 7 by 4 by 3 mm right upper lobe pulmonary nodule. 3. Stable 7 mm sub solid left upper lobe nodule on image 56 series 7. New sub solid left upper lobe 7 by 6 mm nodule on image 95 series 7. Surveillance suggested. 4. Other imaging findings of potential clinical significance: Aortic Atherosclerosis (ICD10-I70.0). Coronary atherosclerosis. Prominent stool throughout the colon favors constipation. Bilateral foraminal impingement at L5-S1 (numbering assumes that the lowest of 13 paired ribs is at the L1 level). * onc * Electronically Signed   By: Van Clines M.D.   On: 09/19/2021 16:26   CT Chest Wo Contrast  Result Date: 09/19/2021 CLINICAL DATA:  Restaging of right upper lobe non-small cell lung cancer with metastatic disease to the brain. Prior chemotherapy, immunotherapy, and radiation therapy. Prior SBRT of the right upper lobe lesion most recently 04/06/2021 EXAM: CT CHEST, ABDOMEN AND PELVIS WITHOUT CONTRAST TECHNIQUE: Multidetector CT imaging of the chest, abdomen and pelvis was performed following the standard protocol without IV contrast. RADIATION DOSE REDUCTION: This exam was performed according to the departmental dose-optimization program which includes automated exposure control, adjustment of the mA and/or kV according to patient size and/or use of iterative reconstruction technique. COMPARISON:  Multiple exams, including 07/05/2021 FINDINGS: CT CHEST FINDINGS Cardiovascular: Coronary, aortic arch, and branch vessel atherosclerotic vascular disease. Mediastinum/Nodes: Unremarkable Lungs/Pleura: Substantially increased airspace opacity posteriorly in the right upper lobe with surrounding interstitial accentuation for example as shown on image 58 series 7,  in the vicinity of the previously treated tumor. There is also some indistinct interstitial accentuation mild volume loss in the superior segment right lower lobe. The distribution makes this suspicious for radiation pneumonitis, although findings are substantially increased from 07/05/2021, which was already 3 months out from the last radiation therapy. This would be an unusually delayed timeframe for radiation pneumonitis, raising the possibility of pneumonia or other potential cause. There is worsened truncation of the posterior segmental bronchus of the right upper lobe for example on images 60-61 of series 7. 7 by 4 by 3 mm right upper lobe nodule on image 65 series 7, formerly 6 by 2 by 2 mm. Mild peripheral interstitial accentuation at the lung bases. 6 by 7 mm sub solid left upper lobe nodule on image 56 series 7, stable. New sub solid 7 by 6 mm left upper lobe nodule on image 95 series 7. Musculoskeletal:  Postoperative findings in the mandible. Thoracic spondylosis. There are 13 paired ribs, for numbering purposes I will assume that the lowest rib-bearing vertebra is L1. CT ABDOMEN PELVIS FINDINGS Hepatobiliary: Unremarkable Pancreas: Unremarkable Spleen: Unremarkable Adrenals/Urinary Tract: Stable 7 mm hypodense lesion of the right kidney upper pole anteriorly on image 17 series 2 is technically too small to characterize although statistically likely to be benign. The kidneys and adrenal glands appear otherwise unremarkable. Stomach/Bowel: Prominent stool throughout the colon favors constipation. Vascular/Lymphatic: Atherosclerosis is present, including aortoiliac atherosclerotic disease. Reproductive: Prostatomegaly. Other: No supplemental non-categorized findings. Musculoskeletal: Bridging spurring and partial fusion of both sacroiliac joints. Disc bulge and degenerative facet arthropathy at L5-S1 contributing to bilateral foraminal impingement. IMPRESSION: 1. Substantially increased airspace opacity in  the right upper lobe surrounding the region of the lesion, with adjacent surrounding interstitial accentuation in the right upper lobe and superior segment right lower lobe. Although the appearance favors radiation pneumonitis, the chronicity is somewhat unusual, as the airspace opacity was not present on 07/05/2021 (12 weeks after the final SBRT appointment) but is currently substantial. This raises the possibility of alternative diagnoses such as pneumonia, correlate with the patient's clinical presentation. There is also substantial narrowing of the posterior segmental bronchus of the right upper lobe. 2. Mild increase in size of a currently 7 by 4 by 3 mm right upper lobe pulmonary nodule. 3. Stable 7 mm sub solid left upper lobe nodule on image 56 series 7. New sub solid left upper lobe 7 by 6 mm nodule on image 95 series 7. Surveillance suggested. 4. Other imaging findings of potential clinical significance: Aortic Atherosclerosis (ICD10-I70.0). Coronary atherosclerosis. Prominent stool throughout the colon favors constipation. Bilateral foraminal impingement at L5-S1 (numbering assumes that the lowest of 13 paired ribs is at the L1 level). * onc * Electronically Signed   By: Van Clines M.D.   On: 09/19/2021 16:26     ASSESSMENT/PLAN:  This is a very pleasant 66 year old African-American male with stage IV non-small cell lung cancer, favored to be adenocarcinoma.  He presented with posterior right upper lobe nodule as well as right hilar, infrahilar, subcarinal, and right paratracheal lymphadenopathy.  He also has a 1.4 cm x 1 cm soft tissue nodule in the skin/subcutaneous fat in the left axilla.  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  He was diagnosed in August 2021.  Molecular studies by [facility] are negative for any actionable mutations.   The patient completed SRS to the small metastatic brain lesion under the care of Dr. Lisbeth Andersen on 03/18/20   The patient is  currently undergoing palliative systemic chemotherapy with carboplatin for an AUC of 5, Alimta 500 mg per metered squared, and Keytruda 200 mg IV every 3 weeks.  He is status post 25 cycles. The dose of alimta was reduced to 400 mg/m2 due to renal insufficiency. Alimta was ultimately removed from the treatment plan with cycle #11 due to renal insufficiency.   The patient completed SBRT to the enlarging right upper lobe lung mass under the care of Dr. Lisbeth Andersen on 04/06/21.   The patient was seen with Dr. Julien Nordmann today.  Dr. Julien Nordmann personally and independently reviewed the scan patient scan and discussed results with the patient today.  The scan showed increased airspace opacity in the right upper lobe surrounding the region of the lesion. Appearance favors radiation pneumonitis, although the chronicity is somewhat unusual, as the airspace opacity was not present on 07/05/21. Raises the possibility  of alternative diagnosis such as pneumonia. There is also substantial narrowing of the posterior segmental bronchus of the right upper lobe. There was mild increase in size of the right upper lobe pulmonary nodule. There is a new subsolid left upper lobe nodule and surveillance suggested.   Dr. Julien Nordmann recommends the patient continue on same treatment at the same dose for now with close monitoring. Dr. Julien Nordmann feels that this could still be inflammatory changes from radiation given the location but we will continue to monitor. The patient is not very symptomatic except for a very mild stable cough. He does not need cough medications for his symptoms.    Labs were reviewed.  Recommend that he proceed with cycle #26 today scheduled.  We will see him back for follow-up visit in 3 weeks for evaluation before starting cycle #27  He will continue to follow closely with neuro oncology for his history of metastatic disease to the brain.  The patient was advised to call immediately if she has any concerning symptoms in  the interval. The patient voices understanding of current disease status and treatment options and is in agreement with the current care plan. All questions were answered. The patient knows to call the clinic with any problems, questions or concerns. We can certainly see the patient much sooner if necessary  No orders of the defined types were placed in this encounter.    Gerica Koble L Yashas Camilli, PA-C 09/20/21  ADDENDUM: Hematology/Oncology Attending: I had a face-to-face encounter with the patient today.  I reviewed his record, lab, scan and recommended his care plan.  This is a very pleasant 66 years old African-American male with a stage IV non-small cell lung cancer favored adenocarcinoma diagnosed in August 2021 with no actionable mutations. The patient is currently on treatment with systemic chemotherapy initially with induction carboplatin, Alimta and Keytruda for 4 cycles and he is currently on maintenance treatment with single agent Keytruda after Alimta was discontinued secondary to renal insufficiency. He is status post 25 cycles.  He has been tolerating this treatment well with no concerning adverse effects. He had repeat CT scan of the chest, abdomen pelvis performed recently. I personally and independently reviewed the scan images and discussed the results with the patient today. His scan showed increase in the airspace opacity in the right upper lobe surrounding the lesion of the lesion with adjacent interstitial exenteration in the right upper lobe and superior segment of the right lower lobe favored to radiation pneumonitis.  There was also mild increase in size of right upper lobe pulmonary nodule and new subsolid left upper lobe 0.7 x 0.6 cm nodule. I discussed the scan results with the patient and recommended for him to continue his current treatment with [facility] for now.  I will continue to monitor the areas of concern on the current scan closely and if there is any further  progression, I will discussed with the patient changing his treatment regimen. The patient is in agreement with the current plan. He will proceed with cycle #26 today. I will see him back for follow-up visit in 3 weeks for evaluation before the next cycle of his treatment. He was advised to call immediately if he has any concerning symptoms in the interval especially any worsening dyspnea, cough or chest pain.  The total time spent in the appointment was 30 minutes. Disclaimer: This note was dictated with voice recognition software. Similar sounding words can inadvertently be transcribed and may be missed upon review. Eilleen Kempf,  MD 09/20/21

## 2021-09-19 ENCOUNTER — Other Ambulatory Visit: Payer: Self-pay

## 2021-09-19 DIAGNOSIS — C3411 Malignant neoplasm of upper lobe, right bronchus or lung: Secondary | ICD-10-CM

## 2021-09-20 ENCOUNTER — Inpatient Hospital Stay: Payer: No Typology Code available for payment source

## 2021-09-20 ENCOUNTER — Inpatient Hospital Stay (HOSPITAL_BASED_OUTPATIENT_CLINIC_OR_DEPARTMENT_OTHER): Payer: No Typology Code available for payment source | Admitting: Physician Assistant

## 2021-09-20 ENCOUNTER — Other Ambulatory Visit: Payer: Self-pay

## 2021-09-20 VITALS — BP 157/85 | HR 85 | Temp 97.9°F | Resp 18 | Ht 74.0 in | Wt 188.4 lb

## 2021-09-20 DIAGNOSIS — Z5112 Encounter for antineoplastic immunotherapy: Secondary | ICD-10-CM

## 2021-09-20 DIAGNOSIS — C3411 Malignant neoplasm of upper lobe, right bronchus or lung: Secondary | ICD-10-CM | POA: Diagnosis not present

## 2021-09-20 LAB — CMP (CANCER CENTER ONLY)
ALT: 8 U/L (ref 0–44)
AST: 11 U/L — ABNORMAL LOW (ref 15–41)
Albumin: 3.9 g/dL (ref 3.5–5.0)
Alkaline Phosphatase: 124 U/L (ref 38–126)
Anion gap: 7 (ref 5–15)
BUN: 15 mg/dL (ref 8–23)
CO2: 23 mmol/L (ref 22–32)
Calcium: 9.5 mg/dL (ref 8.9–10.3)
Chloride: 106 mmol/L (ref 98–111)
Creatinine: 1.57 mg/dL — ABNORMAL HIGH (ref 0.61–1.24)
GFR, Estimated: 48 mL/min — ABNORMAL LOW (ref >60)
Glucose, Bld: 95 mg/dL (ref 70–99)
Potassium: 4.1 mmol/L (ref 3.5–5.1)
Sodium: 136 mmol/L (ref 135–145)
Total Bilirubin: 0.5 mg/dL (ref 0.3–1.2)
Total Protein: 7.9 g/dL (ref 6.5–8.1)

## 2021-09-20 LAB — CBC WITH DIFFERENTIAL (CANCER CENTER ONLY)
Abs Immature Granulocytes: 0.02 10*3/uL (ref 0.00–0.07)
Basophils Absolute: 0.1 10*3/uL (ref 0.0–0.1)
Basophils Relative: 1 %
Eosinophils Absolute: 0.1 10*3/uL (ref 0.0–0.5)
Eosinophils Relative: 2 %
HCT: 41.4 % (ref 39.0–52.0)
Hemoglobin: 13.7 g/dL (ref 13.0–17.0)
Immature Granulocytes: 0 %
Lymphocytes Relative: 20 %
Lymphs Abs: 1.2 10*3/uL (ref 0.7–4.0)
MCH: 28.6 pg (ref 26.0–34.0)
MCHC: 33.1 g/dL (ref 30.0–36.0)
MCV: 86.4 fL (ref 80.0–100.0)
Monocytes Absolute: 0.7 10*3/uL (ref 0.1–1.0)
Monocytes Relative: 13 %
Neutro Abs: 3.7 10*3/uL (ref 1.7–7.7)
Neutrophils Relative %: 64 %
Platelet Count: 219 10*3/uL (ref 150–400)
RBC: 4.79 MIL/uL (ref 4.22–5.81)
RDW: 13.1 % (ref 11.5–15.5)
WBC Count: 5.8 10*3/uL (ref 4.0–10.5)
nRBC: 0 % (ref 0.0–0.2)

## 2021-09-20 LAB — TSH: TSH: 2.38 u[IU]/mL (ref 0.320–4.118)

## 2021-09-20 MED ORDER — SODIUM CHLORIDE 0.9 % IV SOLN
200.0000 mg | Freq: Once | INTRAVENOUS | Status: AC
  Administered 2021-09-20: 200 mg via INTRAVENOUS
  Filled 2021-09-20: qty 200

## 2021-09-20 MED ORDER — SODIUM CHLORIDE 0.9 % IV SOLN: Freq: Once | INTRAVENOUS | Status: AC

## 2021-09-20 NOTE — Patient Instructions (Signed)
[facility] MEDICAL [facility]COLOG[facility]  Discharge Instructions: ?Thank you for choosing [facility] to provide your oncology and hematology care.  ? ?If you have a lab appointment with the Cancer Center, please go directly to the Cancer Center and check in at the registration area. ?  ?Wear comfortable clothing and clothing appropriate for easy access to any Portacath or PICC line.  ? ?We strive to give you quality time with your provider. You may need to reschedule your appointment if you arrive late (15 or more minutes).  Arriving late affects you and other patients whose appointments are after yours.  Also, if you miss three or more appointments without notifying the office, you may be dismissed from the clinic at the provider?s discretion.    ?  ?For prescription refill requests, have your pharmacy contact our office and allow 72 hours for refills to be completed.   ? ?Today you received the following chemotherapy and/or immunotherapy agents: Keytruda ?  ?To help prevent nausea and vomiting after your treatment, we encourage you to take your nausea medication as directed. ? ?BELOW ARE SYMPTOMS THAT SHOULD BE REPORTED IMMEDIATELY: ?*FEVER GREATER THAN 100.4 F (38 ?C) OR HIGHER ?*CHILLS OR SWEATING ?*NAUSEA AND VOMITING THAT IS NOT CONTROLLED WITH YOUR NAUSEA MEDICATION ?*UNUSUAL SHORTNESS OF BREATH ?*UNUSUAL BRUISING OR BLEEDING ?*URINARY PROBLEMS (pain or burning when urinating, or frequent urination) ?*BOWEL PROBLEMS (unusual diarrhea, constipation, pain near the anus) ?TENDERNESS IN MOUTH AND THROAT WITH OR WITHOUT PRESENCE OF ULCERS (sore throat, sores in mouth, or a toothache) ?UNUSUAL RASH, SWELLING OR PAIN  ?UNUSUAL VAGINAL DISCHARGE OR ITCHING  ? ?Items with * indicate a potential emergency and should be followed up as soon as possible or go to the Emergency Department if any problems should occur. ? ?Please show the CHEMOTHERAPY ALERT CARD or IMMUNOTHERAPY ALERT CARD at check-in to the  Emergency Department and triage nurse. ? ?Should you have questions after your visit or need to cancel or reschedule your appointment, please contact [facility]  Dept: [phone]  and follow the prompts.  Office hours are 8:00 a.m. to 4:30 p.m. Monday - Friday. Please note that voicemails left after 4:00 p.m. may not be returned until the following business day.  We are closed weekends and major holidays. You have access to a nurse at all times for urgent questions. Please call the main number to the clinic Dept: [phone] and follow the prompts. ? ? ?For any non-urgent questions, you may also contact your provider using MyChart. We now offer e-Visits for anyone 18 and older to request care online for non-urgent symptoms. For details visit [email or web address]. ?  ?Also download the MyChart app! Go to the app store, search "MyChart", open the app, select , and log in with your MyChart username and password. ? ?Due to Covid, a mask is required upon entering the hospital/clinic. If you do not have a mask, one will be given to you upon arrival. For doctor visits, patients may have 1 support person aged 18 or older with them. For treatment visits, patients cannot have anyone with them due to current Covid guidelines and our immunocompromised population.  ? ?

## 2021-09-20 NOTE — Progress Notes (Signed)
Per Dr. Julien Nordmann, ok to treat with creatnine of 1.57 mg/dl today

## 2021-09-22 ENCOUNTER — Other Ambulatory Visit: Payer: Self-pay | Admitting: Radiation Therapy

## 2021-10-10 NOTE — Progress Notes (Signed)
[facility] ?OFFICE PROGRESS NOTE ? ?[name], MD ?[address] ?[place] [address] ? ?DIAGNOSIS: Stage IV non-small cell lung cancer, favored to be adenocarcinoma.  He presented with posterior right upper lobe nodule as well as right hilar, infrahilar, subcarinal, and right paratracheal lymphadenopathy.  He also has a 1.4 cm x 1 cm soft tissue nodule in the skin/subcutaneous fat in the left axilla.  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  He was diagnosed in August 2021 ?  ?Molecular Biomarkers: ?BIOMARKER(S)         % CFDNA OR AMPLIFICATION       ASSOCIATED FDA-APPROVED THERAPIES        CLINICAL TRIAL AVAILABILITY ?[id number] ?2.9% ?None    ?Yes ?[id number] ?0.4% ?None    ?Yes ?[id number] ?0.2% ?None    ?Yes ? ?PRIOR THERAPY: 1) SRS to the small metastatic brain lesion under the care of Dr. [name] on 03/18/20 ?2) Radiation to the enlarging right upper lobe lung mass under the care of Dr. [name]. Last dose on 04/06/21.  ? ?CURRENT THERAPY: Palliative systemic chemotherapy with carboplatin for an AUC of 5, Alimta 500 mg/m2, and Keytruda 200 mg IV every 3 weeks. First dose expected on 04/07/20. Status post 26 cycles. Starting from cycle #5 he will be on maintenance treatment with Alimta and Keytruda every 3 weeks. His dose of Alimta was reduced to 400 mg/m2. Alimta removed starting from cycle #11 due to renal insuffiencey.  ? ?INTERVAL HISTORY: ?Randy Andersen 66 y.o. male returns to the clinic today for a follow-up visit.  The patient is feeling well today without any concerning complaints.  He is currently undergoing single agent immunotherapy with Keytruda.  He is tolerating treatment well without any concerning adverse side effects.  Alimta was removed from his treatment plan due to renal insufficiency. ? ?The patient denies any recent fever, chills, night sweats, or unexplained weight loss.  He denies any shortness of breath, chest pain, or hemoptysis.  He  reports he has a intermittent mild cough which produces beige phlegm. He reports the cough is similar to his baseline. He does not take anything for cough.  The patient's previous restaging CT scans have shown some airspace opacities in the right upper lobe surrounding the region of the lesion.  Dr. Julien Nordmann feels that this favors radiation pneumonitis, although, the chronicity is somewhat unusual, as the airspace opacity was not present on 07/05/21 .  We are continuing to monitor for now. We are also monitoring a mild increase in size of the right upper lobe pulmonary nodule and a new subsolid left upper lobe nodule. ? ? He denies any nausea, vomiting, diarrhea, or constipation.  He denies any headache or visual changes.  He denies any rashes or skin changes.  He follows closely with neuro oncology regarding his history of metastatic disease to the brain.  He recently had a follow-up appointment in January 2023 with Dr. Mickeal Skinner and is expected to have a repeat scan in April 2023.  They are monitoring him for now. The patient is here today for evaluation before starting cycle #27 ? ? ? ?MEDICAL HISTORY: ?Past Medical History:  ?Diagnosis Date  ? Asthma   ? as a child  ? Chronic pain disorder   ? LT leg and foot  ? Heart murmur   ? as a child  ? Hypertension   ? Malignant neoplasm of upper lobe of right lung ([place])   ?  Stroke [facility])   ? mini stroke - 2015, some tingling in fingers on right   ? ? ?ALLERGIES:  has No Known Allergies. ? ?MEDICATIONS:  ?Current Outpatient Medications  ?Medication Sig Dispense Refill  ? amLODipine (NORVASC) 10 MG tablet Take 10 mg by mouth daily.    ? Cholecalciferol 100 MCG (4000 UT) TABS Take 2 tablets by mouth daily.    ? diclofenac Sodium (VOLTAREN) 1 % GEL Apply 4 g topically 3 (three) times daily.    ? doxylamine, Sleep, (UNISOM) 25 MG tablet Take 1 tablet (25 mg total) by mouth at bedtime as needed for sleep. 30 tablet 0  ? HYDROcodone-acetaminophen (NORCO) 10-325 MG tablet Take 1  tablet by mouth 2 (two) times daily.    ? oxybutynin (DITROPAN-XL) 10 MG 24 hr tablet Take by mouth.    ? polyethylene glycol powder (GLYCOLAX/MIRALAX) 17 GM/SCOOP powder     ? prochlorperazine (COMPAZINE) 10 MG tablet Take 1 tablet (10 mg total) by mouth every 6 (six) hours as needed. 30 tablet 2  ? rivaroxaban (XARELTO) 20 MG TABS tablet TAKE 1 TABLET BY MOUTH ONCE A DAY WITH SUPPER 30 tablet 2  ? senna-docusate (SENOKOT-S) 8.6-50 MG tablet Take 2 tablets by mouth daily.    ? tadalafil (CIALIS) 20 MG tablet Take by mouth.    ? tamsulosin (FLOMAX) 0.4 MG CAPS capsule Take 1 capsule (0.4 mg total) by mouth daily. 30 capsule 2  ? ?No current facility-administered medications for this visit.  ? ? ?SURGICAL HISTORY:  ?Past Surgical History:  ?Procedure Laterality Date  ? BUNIONECTOMY Left   ? had hammer toe surgery also and callus removed  ? BUNIONECTOMY Right   ? also had hammer toe surgery and callus removed  ? LEG SURGERY Left   ? PT reports he has pins placed in his Lt leg  ? ORIF TIBIA PLATEAU  10/09/2012  ? Dr Lorin Mercy  ? ORIF TIBIA PLATEAU Left 10/08/2012  ? Procedure: OPEN REDUCTION INTERNAL FIXATION (ORIF) TIBIAL PLATEAU;  Surgeon: Marybelle Killings, MD;  Location: [facility];  Service: Orthopedics;  Laterality: Left;  ? VIDEO BRONCHOSCOPY WITH ENDOBRONCHIAL NAVIGATION N/A 02/25/2020  ? Procedure: VIDEO BRONCHOSCOPY WITH ENDOBRONCHIAL NAVIGATION with biopsies;  Surgeon: Collene Gobble, MD;  Location: [facility];  Service: Thoracic;  Laterality: N/A;  ? VIDEO BRONCHOSCOPY WITH ENDOBRONCHIAL ULTRASOUND N/A 02/25/2020  ? Procedure: VIDEO BRONCHOSCOPY WITH ENDOBRONCHIAL ULTRASOUND;  Surgeon: Collene Gobble, MD;  Location: [facility];  Service: Thoracic;  Laterality: N/A;  ? ? ?REVIEW OF SYSTEMS:   ?Review of Systems  ?Constitutional: Negative for appetite change, chills, fatigue, fever and unexpected weight change.  ?HENT:   Negative for mouth sores, nosebleeds, sore throat and trouble swallowing.   ?Eyes: Negative for eye problems and  icterus.  ?Respiratory: Positive for mild stable cough. Negative for hemoptysis, shortness of breath and wheezing.   ?Cardiovascular: Negative for chest pain and leg swelling.  ?Gastrointestinal: Negative for abdominal pain, constipation, diarrhea, nausea and vomiting.  ?Genitourinary: Negative for bladder incontinence, difficulty urinating, dysuria, frequency and hematuria.   ?Musculoskeletal: Negative for back pain, gait problem, neck pain and neck stiffness.  ?Skin: Negative for itching and rash.  ?Neurological: Negative for dizziness, extremity weakness, gait problem, headaches, light-headedness and seizures.  ?Hematological: Negative for adenopathy. Does not bruise/bleed easily.  ?Psychiatric/Behavioral: Negative for confusion, depression and sleep disturbance. The patient is not nervous/anxious.     ? ? ?PHYSICAL EXAMINATION:  ?There were no vitals taken for this visit. ? ?  ECOG PERFORMANCE STATUS: 1 ? ?Physical Exam  ?Constitutional: Oriented to person, place, and time and well-developed, well-nourished, and in no distress. ?HENT:  ?Head: Normocephalic and atraumatic.  ?Mouth/Throat: Oropharynx is clear and moist. No oropharyngeal exudate.  ?Eyes: Conjunctivae are normal. Right eye exhibits no discharge. Left eye exhibits no discharge. No scleral icterus.  ?Neck: Normal range of motion. Neck supple.  ?Cardiovascular: Normal rate, regular rhythm, normal heart sounds and intact distal pulses.   ?Pulmonary/Chest: Effort normal and breath sounds normal. No respiratory distress. No wheezes. No rales.  ?Abdominal: Soft. Bowel sounds are normal. Exhibits no distension and no mass. There is no tenderness.  ?Musculoskeletal: Normal range of motion. Exhibits no edema.  ?Lymphadenopathy:  ?  No cervical adenopathy.  ?Neurological: Alert and oriented to person, place, and time. Exhibits normal muscle tone. Gait normal. Coordination normal.  ?Skin: Skin is warm and dry. No rash noted. Not diaphoretic. No erythema. No  pallor.  ?Psychiatric: Mood, memory and judgment normal.  ?Vitals reviewed. ? ?LABORATORY DATA: ?Lab Results  ?Component Value Date  ? WBC 5.2 10/12/2021  ? HGB 13.3 10/12/2021  ? HCT 40.9 10/12/2021  ? MCV 86.3 03/

## 2021-10-12 ENCOUNTER — Inpatient Hospital Stay: Payer: No Typology Code available for payment source

## 2021-10-12 ENCOUNTER — Inpatient Hospital Stay: Payer: No Typology Code available for payment source | Attending: Internal Medicine | Admitting: Physician Assistant

## 2021-10-12 ENCOUNTER — Ambulatory Visit: Payer: No Typology Code available for payment source

## 2021-10-12 ENCOUNTER — Other Ambulatory Visit: Payer: Self-pay

## 2021-10-12 VITALS — BP 129/75 | HR 69 | Temp 98.4°F | Resp 18

## 2021-10-12 DIAGNOSIS — Z5112 Encounter for antineoplastic immunotherapy: Secondary | ICD-10-CM | POA: Diagnosis present

## 2021-10-12 DIAGNOSIS — Z79899 Other long term (current) drug therapy: Secondary | ICD-10-CM | POA: Insufficient documentation

## 2021-10-12 DIAGNOSIS — C3411 Malignant neoplasm of upper lobe, right bronchus or lung: Secondary | ICD-10-CM

## 2021-10-12 DIAGNOSIS — C7931 Secondary malignant neoplasm of brain: Secondary | ICD-10-CM | POA: Diagnosis not present

## 2021-10-12 LAB — CBC WITH DIFFERENTIAL (CANCER CENTER ONLY)
Abs Immature Granulocytes: 0.01 10*3/uL (ref 0.00–0.07)
Basophils Absolute: 0 10*3/uL (ref 0.0–0.1)
Basophils Relative: 1 %
Eosinophils Absolute: 0.1 10*3/uL (ref 0.0–0.5)
Eosinophils Relative: 3 %
HCT: 40.9 % (ref 39.0–52.0)
Hemoglobin: 13.3 g/dL (ref 13.0–17.0)
Immature Granulocytes: 0 %
Lymphocytes Relative: 26 %
Lymphs Abs: 1.4 10*3/uL (ref 0.7–4.0)
MCH: 28.1 pg (ref 26.0–34.0)
MCHC: 32.5 g/dL (ref 30.0–36.0)
MCV: 86.3 fL (ref 80.0–100.0)
Monocytes Absolute: 0.8 10*3/uL (ref 0.1–1.0)
Monocytes Relative: 15 %
Neutro Abs: 2.9 10*3/uL (ref 1.7–7.7)
Neutrophils Relative %: 55 %
Platelet Count: 235 10*3/uL (ref 150–400)
RBC: 4.74 MIL/uL (ref 4.22–5.81)
RDW: 13.2 % (ref 11.5–15.5)
WBC Count: 5.2 10*3/uL (ref 4.0–10.5)
nRBC: 0 % (ref 0.0–0.2)

## 2021-10-12 LAB — CMP (CANCER CENTER ONLY)
ALT: 8 U/L (ref 0–44)
AST: 11 U/L — ABNORMAL LOW (ref 15–41)
Albumin: 3.8 g/dL (ref 3.5–5.0)
Alkaline Phosphatase: 116 U/L (ref 38–126)
Anion gap: 6 (ref 5–15)
BUN: 15 mg/dL (ref 8–23)
CO2: 27 mmol/L (ref 22–32)
Calcium: 9.5 mg/dL (ref 8.9–10.3)
Chloride: 106 mmol/L (ref 98–111)
Creatinine: 1.53 mg/dL — ABNORMAL HIGH (ref 0.61–1.24)
GFR, Estimated: 50 mL/min — ABNORMAL LOW (ref >60)
Glucose, Bld: 98 mg/dL (ref 70–99)
Potassium: 4 mmol/L (ref 3.5–5.1)
Sodium: 139 mmol/L (ref 135–145)
Total Bilirubin: 0.3 mg/dL (ref 0.3–1.2)
Total Protein: 7.9 g/dL (ref 6.5–8.1)

## 2021-10-12 LAB — TSH: TSH: 1.973 u[IU]/mL (ref 0.320–4.118)

## 2021-10-12 MED ORDER — SODIUM CHLORIDE 0.9 % IV SOLN
200.0000 mg | Freq: Once | INTRAVENOUS | Status: AC
  Administered 2021-10-12: 200 mg via INTRAVENOUS
  Filled 2021-10-12: qty 200

## 2021-10-12 MED ORDER — SODIUM CHLORIDE 0.9 % IV SOLN: Freq: Once | INTRAVENOUS | Status: AC

## 2021-10-12 NOTE — Progress Notes (Signed)
Per Dr Julien Nordmann and Rubin Payor PA, Okay to treat today with creatinine of 1.53 mg/dL ?

## 2021-10-18 ENCOUNTER — Ambulatory Visit: Payer: No Typology Code available for payment source

## 2021-10-18 ENCOUNTER — Inpatient Hospital Stay: Payer: No Typology Code available for payment source | Admitting: Internal Medicine

## 2021-10-18 ENCOUNTER — Other Ambulatory Visit: Payer: No Typology Code available for payment source

## 2021-10-26 NOTE — Progress Notes (Signed)
[facility] ?OFFICE PROGRESS NOTE ? ?[name], MD ?[address] ?[place] [address] ? ?DIAGNOSIS: Stage IV non-small cell lung cancer, favored to be adenocarcinoma.  He presented with posterior right upper lobe nodule as well as right hilar, infrahilar, subcarinal, and right paratracheal lymphadenopathy.  He also has a 1.4 cm x 1 cm soft tissue nodule in the skin/subcutaneous fat in the left axilla.  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  He was diagnosed in August 2021 ?  ?Molecular Biomarkers: ?BIOMARKER(S)         % CFDNA OR AMPLIFICATION       ASSOCIATED FDA-APPROVED THERAPIES        CLINICAL TRIAL AVAILABILITY ?[id number] ?2.9% ?None    ?Yes ?[id number] ?0.4% ?None    ?Yes ?[id number] ?0.2% ?None    ?Yes ? ?PRIOR THERAPY:   ?1) SRS to the small metastatic brain lesion under the care of Dr. [name] on 03/18/20 ?2) Radiation to the enlarging right upper lobe lung mass under the care of Dr. [name]. Last dose on 04/06/21.  ? ?CURRENT THERAPY: Palliative systemic chemotherapy with carboplatin for an AUC of 5, Alimta 500 mg/m2, and Keytruda 200 mg IV every 3 weeks. First dose expected on 04/07/20. Status post 27 cycles. Starting from cycle #5 he will be on maintenance treatment with Alimta and Keytruda every 3 weeks. His dose of Alimta was reduced to 400 mg/m2. Alimta removed starting from cycle #11 due to renal insuffiencey.  ? ?INTERVAL HISTORY: ?Randy Andersen 66 y.o. male returns to the clinic today for a follow-up visit.  The patient is feeling well today without any concerning complaints.  He is currently undergoing single agent immunotherapy with Keytruda.  He is tolerating treatment well without any concerning adverse side effects.  Alimta was removed from his treatment plan due to renal insufficiency.  ? ?The patient denies any recent fever, chills, or night sweats. He lost a few pounds in the interval. He states he has a good appetite but he is waiting  for a paycheck to go grocery shopping, he states he is stretching his groceries. He denies any shortness of breath, chest pain, or hemoptysis.  He reports he has a intermittent mild cough which produces phlegm. He reports the cough is similar to his baseline. He does not take anything for cough.  The patient's previous restaging CT scans have shown some airspace opacities in the right upper lobe surrounding the region of the lesion.  Dr. Julien Nordmann feels that this favors radiation pneumonitis, although, the chronicity is somewhat unusual, as the airspace opacity was not present on 07/05/21.  We are continuing to monitor for now. We are also monitoring a mild increase in size of the right upper lobe pulmonary nodule and a new subsolid left upper lobe nodule. ? ? He denies any nausea, vomiting, diarrhea, or constipation.  He denies any headache or visual changes.  He denies any rashes or skin changes.  He follows closely with neuro oncology regarding his history of metastatic disease to the brain. He is expected to have a repeat brain MRI scan on 11/03/21 and a follow up with Dr. Mickeal Skinner on 11/07/21. The patient is here today for evaluation before starting cycle #28 ? ? ? ? ? ?MEDICAL HISTORY: ?Past Medical History:  ?Diagnosis Date  ? Asthma   ? as a child  ? Chronic pain disorder   ? LT leg and foot  ? Heart murmur   ?  as a child  ? Hypertension   ? Malignant neoplasm of upper lobe of right lung ([place])   ? Stroke [facility])   ? mini stroke - 2015, some tingling in fingers on right   ? ? ?ALLERGIES:  has No Known Allergies. ? ?MEDICATIONS:  ?Current Outpatient Medications  ?Medication Sig Dispense Refill  ? amLODipine (NORVASC) 10 MG tablet Take 10 mg by mouth daily.    ? Cholecalciferol 100 MCG (4000 UT) TABS Take 2 tablets by mouth daily.    ? diclofenac Sodium (VOLTAREN) 1 % GEL Apply 4 g topically 3 (three) times daily.    ? doxylamine, Sleep, (UNISOM) 25 MG tablet Take 1 tablet (25 mg total) by mouth at bedtime as needed  for sleep. 30 tablet 0  ? HYDROcodone-acetaminophen (NORCO) 10-325 MG tablet Take 1 tablet by mouth 2 (two) times daily.    ? oxybutynin (DITROPAN-XL) 10 MG 24 hr tablet Take by mouth.    ? polyethylene glycol powder (GLYCOLAX/MIRALAX) 17 GM/SCOOP powder     ? prochlorperazine (COMPAZINE) 10 MG tablet Take 1 tablet (10 mg total) by mouth every 6 (six) hours as needed. 30 tablet 2  ? rivaroxaban (XARELTO) 20 MG TABS tablet TAKE 1 TABLET BY MOUTH ONCE A DAY WITH SUPPER 30 tablet 2  ? senna-docusate (SENOKOT-S) 8.6-50 MG tablet Take 2 tablets by mouth daily.    ? tadalafil (CIALIS) 20 MG tablet Take by mouth.    ? tamsulosin (FLOMAX) 0.4 MG CAPS capsule Take 1 capsule (0.4 mg total) by mouth daily. 30 capsule 2  ? ?No current facility-administered medications for this visit.  ? ? ?SURGICAL HISTORY:  ?Past Surgical History:  ?Procedure Laterality Date  ? BUNIONECTOMY Left   ? had hammer toe surgery also and callus removed  ? BUNIONECTOMY Right   ? also had hammer toe surgery and callus removed  ? LEG SURGERY Left   ? PT reports he has pins placed in his Lt leg  ? ORIF TIBIA PLATEAU  10/09/2012  ? Dr Lorin Mercy  ? ORIF TIBIA PLATEAU Left 10/08/2012  ? Procedure: OPEN REDUCTION INTERNAL FIXATION (ORIF) TIBIAL PLATEAU;  Surgeon: Marybelle Killings, MD;  Location: [facility];  Service: Orthopedics;  Laterality: Left;  ? VIDEO BRONCHOSCOPY WITH ENDOBRONCHIAL NAVIGATION N/A 02/25/2020  ? Procedure: VIDEO BRONCHOSCOPY WITH ENDOBRONCHIAL NAVIGATION with biopsies;  Surgeon: Collene Gobble, MD;  [facility];  Service: Thoracic;  Laterality: N/A;  ? VIDEO BRONCHOSCOPY WITH ENDOBRONCHIAL ULTRASOUND N/A 02/25/2020  ? Procedure: VIDEO BRONCHOSCOPY WITH ENDOBRONCHIAL ULTRASOUND;  Surgeon: Collene Gobble, MD;  [facility];  Service: Thoracic;  Laterality: N/A;  ? ? ?REVIEW OF SYSTEMS:   ?Review of Systems  ?Constitutional: Negative for appetite change, chills, fatigue, fever and unexpected weight change.  ?HENT:   Negative for mouth sores,  nosebleeds, sore throat and trouble swallowing.   ?Eyes: Negative for eye problems and icterus.  ?Respiratory: Positive for mild stable cough. Negative for hemoptysis, shortness of breath and wheezing.   ?Cardiovascular: Negative for chest pain and leg swelling.  ?Gastrointestinal: Negative for abdominal pain, constipation, diarrhea, nausea and vomiting.  ?Genitourinary: Negative for bladder incontinence, difficulty urinating, dysuria, frequency and hematuria.   ?Musculoskeletal: Negative for back pain, gait problem, neck pain and neck stiffness.  ?Skin: Negative for itching and rash.  ?Neurological: Negative for dizziness, extremity weakness, gait problem, headaches, light-headedness and seizures.  ?Hematological: Negative for adenopathy. Does not bruise/bleed easily.  ?Psychiatric/Behavioral: Negative for confusion, depression and sleep disturbance. The patient  is not nervous/anxious. ? ? ?PHYSICAL EXAMINATION:  ?Blood pressure (!) 148/89, pulse (!) 112, temperature 97.8 ?F (36.6 ?C), temperature source Tympanic, resp. rate 18, height 6\' 2"  (1.88 m), weight 183 lb 6 oz (83.2 kg), SpO2 100 %. ? ?ECOG PERFORMANCE STATUS: 1 ? ?Physical Exam  ?Constitutional: Oriented to person, place, and time and well-developed, well-nourished, and in no distress. ?HENT:  ?Head: Normocephalic and atraumatic.  ?Mouth/Throat: Oropharynx is clear and moist. No oropharyngeal exudate.  ?Eyes: Conjunctivae are normal. Right eye exhibits no discharge. Left eye exhibits no discharge. No scleral icterus.  ?Neck: Normal range of motion. Neck supple.  ?Cardiovascular: Normal rate, regular rhythm, normal heart sounds and intact distal pulses.   ?Pulmonary/Chest: Effort normal and breath sounds normal. No respiratory distress. No wheezes. No rales.  ?Abdominal: Soft. Bowel sounds are normal. Exhibits no distension and no mass. There is no tenderness.  ?Musculoskeletal: Normal range of motion. Exhibits no edema.  ?Lymphadenopathy:  ?  No cervical  adenopathy.  ?Neurological: Alert and oriented to person, place, and time. Exhibits normal muscle tone. Gait normal. Coordination normal.  ?Skin: Skin is warm and dry. No rash noted. Not diaphoretic. No erythe

## 2021-10-31 ENCOUNTER — Inpatient Hospital Stay: Payer: No Typology Code available for payment source | Attending: Internal Medicine

## 2021-10-31 ENCOUNTER — Other Ambulatory Visit: Payer: Medicare PPO

## 2021-10-31 ENCOUNTER — Inpatient Hospital Stay: Payer: No Typology Code available for payment source

## 2021-10-31 ENCOUNTER — Other Ambulatory Visit: Payer: Self-pay

## 2021-10-31 ENCOUNTER — Inpatient Hospital Stay (HOSPITAL_BASED_OUTPATIENT_CLINIC_OR_DEPARTMENT_OTHER): Payer: No Typology Code available for payment source | Admitting: Physician Assistant

## 2021-10-31 VITALS — BP 148/89 | HR 112 | Temp 97.8°F | Resp 18 | Ht 74.0 in | Wt 183.4 lb

## 2021-10-31 DIAGNOSIS — C7931 Secondary malignant neoplasm of brain: Secondary | ICD-10-CM | POA: Diagnosis not present

## 2021-10-31 DIAGNOSIS — C3411 Malignant neoplasm of upper lobe, right bronchus or lung: Secondary | ICD-10-CM | POA: Diagnosis not present

## 2021-10-31 DIAGNOSIS — Z7901 Long term (current) use of anticoagulants: Secondary | ICD-10-CM | POA: Diagnosis not present

## 2021-10-31 DIAGNOSIS — C7989 Secondary malignant neoplasm of other specified sites: Secondary | ICD-10-CM | POA: Diagnosis not present

## 2021-10-31 DIAGNOSIS — I10 Essential (primary) hypertension: Secondary | ICD-10-CM | POA: Diagnosis not present

## 2021-10-31 DIAGNOSIS — Z79899 Other long term (current) drug therapy: Secondary | ICD-10-CM | POA: Insufficient documentation

## 2021-10-31 DIAGNOSIS — Z8673 Personal history of transient ischemic attack (TIA), and cerebral infarction without residual deficits: Secondary | ICD-10-CM | POA: Insufficient documentation

## 2021-10-31 DIAGNOSIS — Z5112 Encounter for antineoplastic immunotherapy: Secondary | ICD-10-CM | POA: Insufficient documentation

## 2021-10-31 DIAGNOSIS — E876 Hypokalemia: Secondary | ICD-10-CM | POA: Diagnosis not present

## 2021-10-31 DIAGNOSIS — C3490 Malignant neoplasm of unspecified part of unspecified bronchus or lung: Secondary | ICD-10-CM | POA: Diagnosis not present

## 2021-10-31 LAB — CBC WITH DIFFERENTIAL (CANCER CENTER ONLY)
Abs Immature Granulocytes: 0.03 10*3/uL (ref 0.00–0.07)
Basophils Absolute: 0 10*3/uL (ref 0.0–0.1)
Basophils Relative: 1 %
Eosinophils Absolute: 0.1 10*3/uL (ref 0.0–0.5)
Eosinophils Relative: 1 %
HCT: 44.3 % (ref 39.0–52.0)
Hemoglobin: 14.4 g/dL (ref 13.0–17.0)
Immature Granulocytes: 0 %
Lymphocytes Relative: 20 %
Lymphs Abs: 1.4 10*3/uL (ref 0.7–4.0)
MCH: 27.6 pg (ref 26.0–34.0)
MCHC: 32.5 g/dL (ref 30.0–36.0)
MCV: 84.9 fL (ref 80.0–100.0)
Monocytes Absolute: 0.8 10*3/uL (ref 0.1–1.0)
Monocytes Relative: 11 %
Neutro Abs: 4.8 10*3/uL (ref 1.7–7.7)
Neutrophils Relative %: 67 %
Platelet Count: 297 10*3/uL (ref 150–400)
RBC: 5.22 MIL/uL (ref 4.22–5.81)
RDW: 13.1 % (ref 11.5–15.5)
WBC Count: 7.1 10*3/uL (ref 4.0–10.5)
nRBC: 0 % (ref 0.0–0.2)

## 2021-10-31 LAB — CMP (CANCER CENTER ONLY)
ALT: 13 U/L (ref 0–44)
AST: 12 U/L — ABNORMAL LOW (ref 15–41)
Albumin: 4.1 g/dL (ref 3.5–5.0)
Alkaline Phosphatase: 128 U/L — ABNORMAL HIGH (ref 38–126)
Anion gap: 10 (ref 5–15)
BUN: 17 mg/dL (ref 8–23)
CO2: 23 mmol/L (ref 22–32)
Calcium: 9.7 mg/dL (ref 8.9–10.3)
Chloride: 105 mmol/L (ref 98–111)
Creatinine: 1.61 mg/dL — ABNORMAL HIGH (ref 0.61–1.24)
GFR, Estimated: 47 mL/min — ABNORMAL LOW (ref >60)
Glucose, Bld: 113 mg/dL — ABNORMAL HIGH (ref 70–99)
Potassium: 3.2 mmol/L — ABNORMAL LOW (ref 3.5–5.1)
Sodium: 138 mmol/L (ref 135–145)
Total Bilirubin: 0.5 mg/dL (ref 0.3–1.2)
Total Protein: 9.1 g/dL — ABNORMAL HIGH (ref 6.5–8.1)

## 2021-10-31 LAB — TSH: TSH: 1.303 u[IU]/mL (ref 0.320–4.118)

## 2021-10-31 MED ORDER — SODIUM CHLORIDE 0.9 % IV SOLN: Freq: Once | INTRAVENOUS | Status: AC

## 2021-10-31 MED ORDER — SODIUM CHLORIDE 0.9 % IV SOLN
200.0000 mg | Freq: Once | INTRAVENOUS | Status: AC
  Administered 2021-10-31: 200 mg via INTRAVENOUS
  Filled 2021-10-31: qty 200

## 2021-10-31 MED ORDER — POTASSIUM CHLORIDE CRYS ER 20 MEQ PO TBCR: 20.0000 meq | EXTENDED_RELEASE_TABLET | Freq: Every day | ORAL | 0 refills | Status: DC

## 2021-10-31 NOTE — Progress Notes (Signed)
OK to treat despite elevated Creat per [name] H PA.  Oral K+ script to be sent in.  ?

## 2021-11-02 ENCOUNTER — Other Ambulatory Visit: Payer: No Typology Code available for payment source

## 2021-11-02 ENCOUNTER — Other Ambulatory Visit: Payer: Medicare PPO

## 2021-11-02 ENCOUNTER — Ambulatory Visit: Payer: No Typology Code available for payment source

## 2021-11-02 ENCOUNTER — Ambulatory Visit: Payer: No Typology Code available for payment source | Admitting: Internal Medicine

## 2021-11-03 ENCOUNTER — Ambulatory Visit
Admission: RE | Admit: 2021-11-03 | Discharge: 2021-11-03 | Disposition: A | Discharge status: Home / Self Care | Payer: Medicare PPO | Source: Ambulatory Visit | Attending: Internal Medicine | Admitting: Internal Medicine

## 2021-11-03 DIAGNOSIS — C7931 Secondary malignant neoplasm of brain: Secondary | ICD-10-CM

## 2021-11-03 DIAGNOSIS — G936 Cerebral edema: Secondary | ICD-10-CM | POA: Diagnosis not present

## 2021-11-03 DIAGNOSIS — C3490 Malignant neoplasm of unspecified part of unspecified bronchus or lung: Secondary | ICD-10-CM | POA: Diagnosis not present

## 2021-11-03 IMAGING — MR MR HEAD WO/W CM
14 series · 48 of 48 positions shown · IV contrast (Multihance 18 ml)
Comparison: Most recent brain MRI [DATE]

CLINICAL DATA: Follow-up brain metastatic lesions, lung cancer

EXAM:
MRI HEAD WITHOUT AND WITH CONTRAST
TECHNIQUE: Multiplanar, multiecho pulse sequences of the brain and surrounding
structures were obtained without and with intravenous contrast.
CONTRAST:  18mL MULTIHANCE GADOBENATE DIMEGLUMINE 529 MG/ML IV SOLN

[Series 5: T1 · sagittal · 4.0mm · 0.98mm/px · 1 of 31 slices shown (1 of 3)]
[im 1/31]
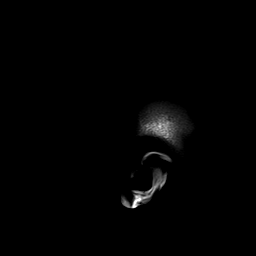

[Series 6: DWI · axial · 3.0mm · 0.94mm/px · z∈[-64,+84]mm · 8 of 168 slices shown (1 of 3)]
[im 1/168]
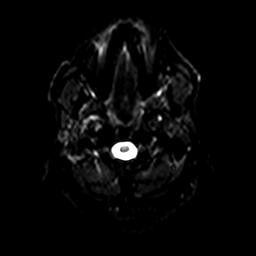
[im 24/168]
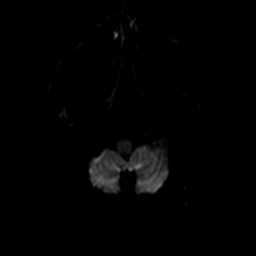
[im 48/168]
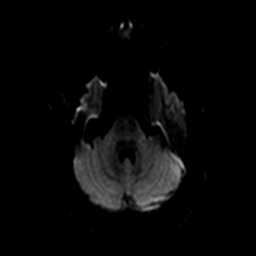
[im 72/168]
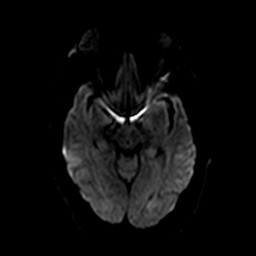
[im 96/168]
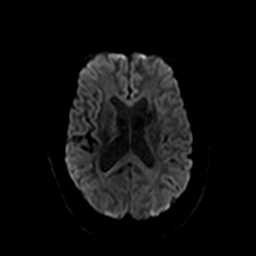
[im 120/168]
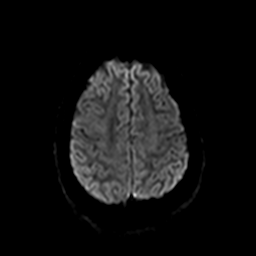
[im 144/168]
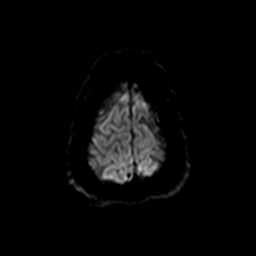
[im 168/168]
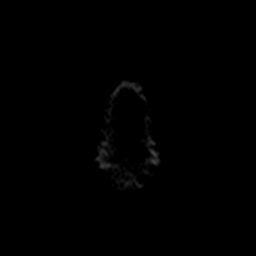

[Series 7: ax dwi_tracew · axial · 3.0mm · 0.94mm/px · z∈[-64,+84]mm · 4 of 84 slices shown]
[im 1/84]
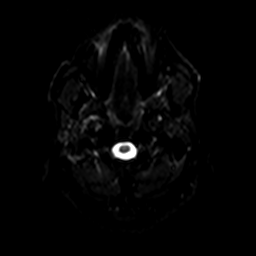
[im 28/84]
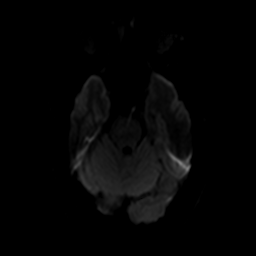
[im 56/84]
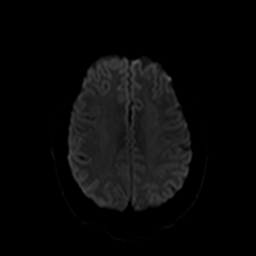
[im 84/84]
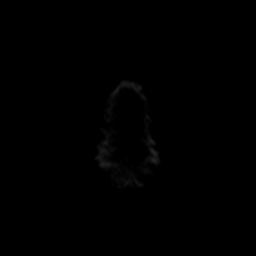

[Series 8: ax dwi_adc · axial · 3.0mm · 0.94mm/px · z∈[-64,+84]mm · 2 of 42 slices shown]
[im 1/42]
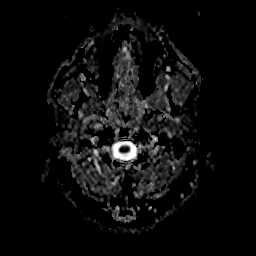
[im 42/42]
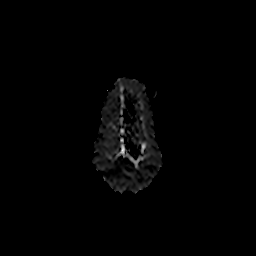

[Series 9: DWI · coronal · 5.0mm · 1.44mm/px · 3 of 70 slices shown (2 of 3)]
[im 1/70]
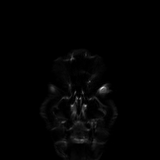
[im 35/70]
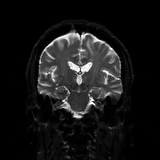
[im 70/70]
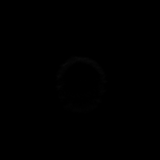

[Series 10: DWI · coronal · 5.0mm · 1.44mm/px · 2 of 35 slices shown (3 of 3)]
[im 1/35]
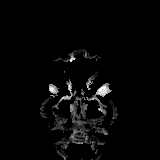
[im 35/35]
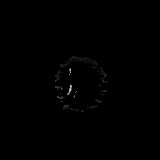

[Series 11: T2 · axial · 4.0mm · 0.38mm/px · 1 of 32 slices shown]
[im 1/32]
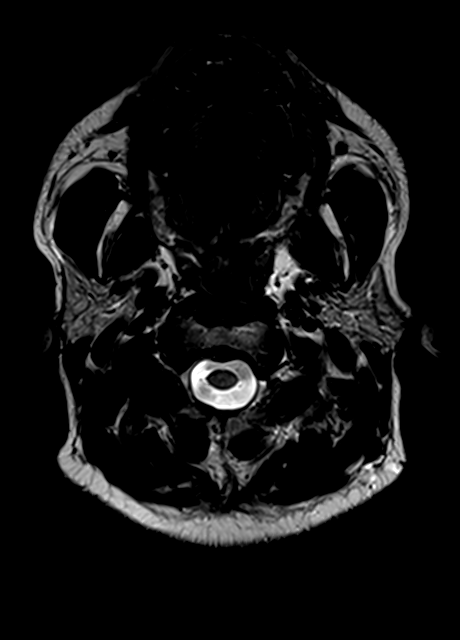

[Series 12: FLAIR · axial · 3.0mm · 0.75mm/px · 1 of 29 slices shown]
[im 1/29]
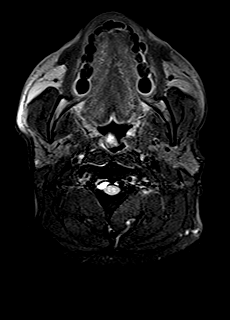

[Series 14: swi_images · axial · 1.5mm · 0.90mm/px · z∈[-69,+85]mm · 5 of 104 slices shown]
[im 1/104]
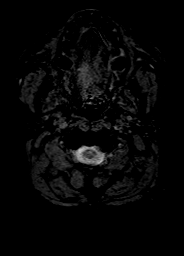
[im 26/104]
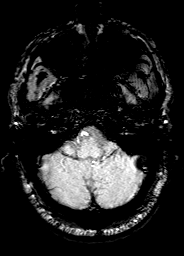
[im 52/104]
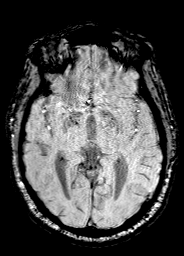
[im 78/104]
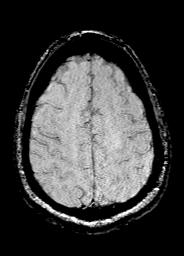
[im 104/104]
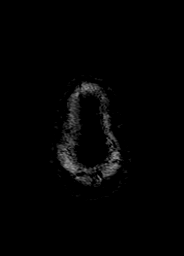

[Series 15: T1 · axial · 1.0mm · 0.98mm/px · z∈[-85,+87]mm · 8 of 173 slices shown (2 of 3)]
[im 1/173]
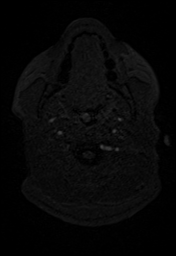
[im 25/173]
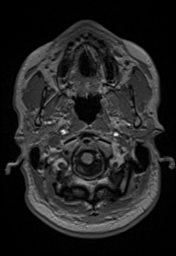
[im 50/173]
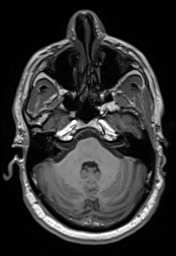
[im 74/173]
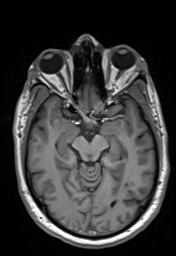
[im 99/173]
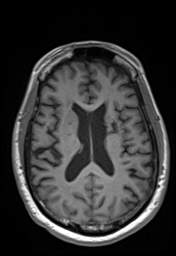
[im 123/173]
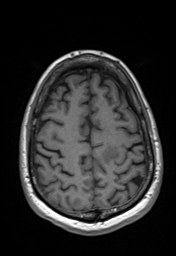
[im 148/173]
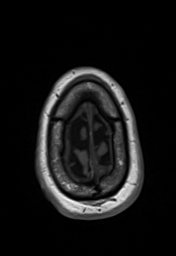
[im 173/173]
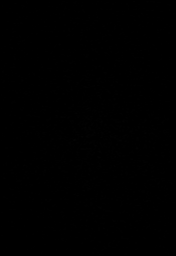

[Series 16: T2 post-contrast · coronal · 4.5mm · 0.36mm/px · 2 of 35 slices shown]
[im 1/35]
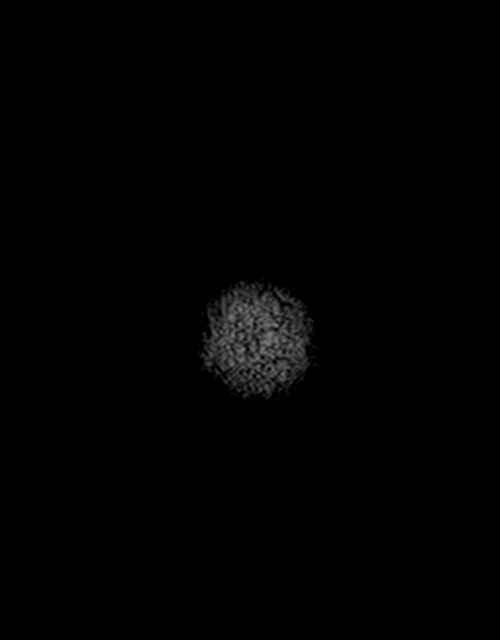
[im 35/35]
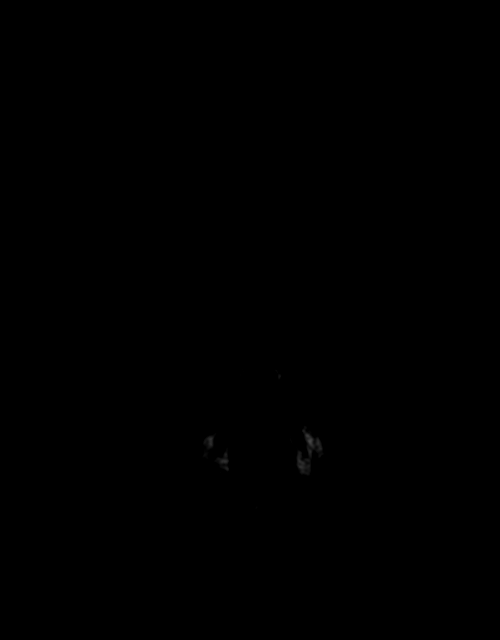

[Series 17: T1 · axial · 1.0mm · 0.98mm/px · z∈[-85,+87]mm · 8 of 173 slices shown (3 of 3)]
[im 1/173]
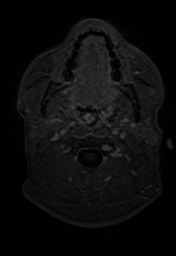
[im 25/173]
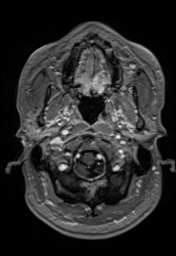
[im 50/173]
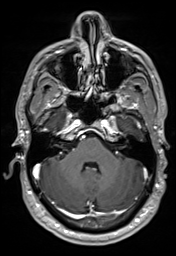
[im 74/173]
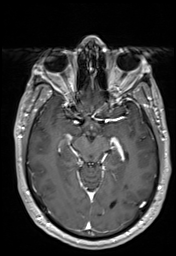
[im 99/173]
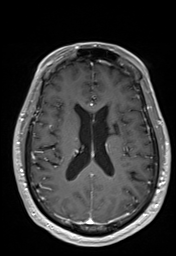
[im 123/173]
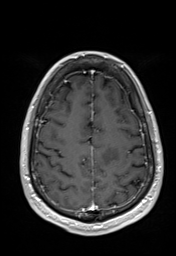
[im 148/173]
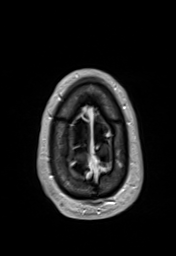
[im 173/173]
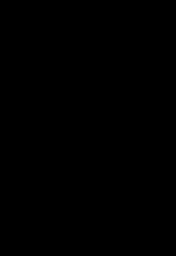

[Series 18: T1 post-contrast · coronal · 4.5mm · 0.90mm/px · 2 of 35 slices shown (1 of 2)]
[im 1/35]
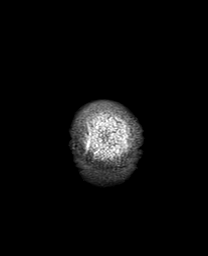
[im 35/35]
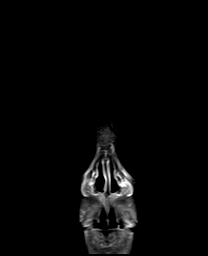

[Series 19: T1 post-contrast · sagittal · 4.0mm · 0.78mm/px · 1 of 31 slices shown (2 of 2)]
[im 1/31]
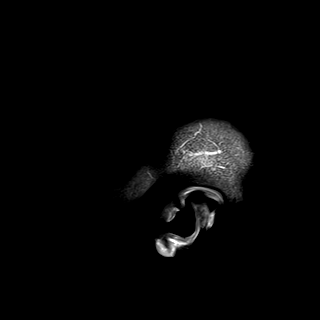

[48 of 48 positions shown; findings below may reference images not displayed]

FINDINGS: Brain: The 0.8 cm TV x 0.7 cm AP peripherally enhancing lesion in
the left precentral gyrus is increased in size since [DATE] when
it measured up to 0.5 cm. Surrounding edema has also increased, but
there is no regional mass effect. A small focus of SWI signal
dropout may reflect a small amount of intralesional blood products.

No other enhancing lesions are seen.

There is no acute intracranial hemorrhage, extra-axial fluid
collection, or acute infarct. Background parenchymal volume is
normal. The ventricles are stable in size. Remote infarcts in the
bilateral basal ganglia and left caudate infarcts are unchanged,
with stable ex vacuo dilatation of the left lateral ventricle.

There is no midline shift.

Vascular: Normal flow voids.

Skull and upper cervical spine: Normal marrow signal.

Sinuses/Orbits: The paranasal sinuses are clear. The globes and
orbits are unremarkable.

Other: None.
IMPRESSION: 1. Increased size of the metastatic lesion in the left precentral
gyrus, now measuring up to 0.8 cm (previously 0.4 cm) with increased
surrounding edema.
2. No other lesions identified.

## 2021-11-03 MED ORDER — GADOBENATE DIMEGLUMINE 529 MG/ML IV SOLN
18.0000 mL | Freq: Once | INTRAVENOUS | Status: AC | PRN
  Administered 2021-11-03: 18 mL via INTRAVENOUS

## 2021-11-07 ENCOUNTER — Inpatient Hospital Stay: Payer: No Typology Code available for payment source | Admitting: Internal Medicine

## 2021-11-07 ENCOUNTER — Ambulatory Visit: Payer: No Typology Code available for payment source | Admitting: Internal Medicine

## 2021-11-07 ENCOUNTER — Telehealth: Payer: Self-pay | Admitting: Internal Medicine

## 2021-11-07 ENCOUNTER — Inpatient Hospital Stay: Payer: No Typology Code available for payment source

## 2021-11-07 NOTE — Telephone Encounter (Signed)
Per 4/17 in basket called and spoke to pt about appointment.  Pt confirmed appointment  ?

## 2021-11-10 ENCOUNTER — Other Ambulatory Visit (HOSPITAL_COMMUNITY): Payer: Self-pay

## 2021-11-10 ENCOUNTER — Encounter: Payer: Self-pay | Admitting: Internal Medicine

## 2021-11-10 ENCOUNTER — Other Ambulatory Visit: Payer: Self-pay

## 2021-11-10 ENCOUNTER — Inpatient Hospital Stay (HOSPITAL_BASED_OUTPATIENT_CLINIC_OR_DEPARTMENT_OTHER): Payer: No Typology Code available for payment source | Admitting: Internal Medicine

## 2021-11-10 VITALS — BP 154/80 | HR 80 | Temp 98.8°F | Resp 18 | Ht 74.0 in | Wt 189.7 lb

## 2021-11-10 DIAGNOSIS — Z5112 Encounter for antineoplastic immunotherapy: Secondary | ICD-10-CM | POA: Diagnosis not present

## 2021-11-10 DIAGNOSIS — C7931 Secondary malignant neoplasm of brain: Secondary | ICD-10-CM | POA: Diagnosis not present

## 2021-11-10 MED ORDER — DEXAMETHASONE 4 MG PO TABS
4.0000 mg | ORAL_TABLET | Freq: Every day | ORAL | 0 refills | Status: DC
  Filled 2021-11-10 – 2021-11-28 (×2): qty 10, 10d supply, fill #0

## 2021-11-10 NOTE — Progress Notes (Signed)
? ?[facility] [place] [address]  ?[place], [place] [address] ?[phone] ? ? ?Interval Evaluation ? ?Date of Service: 11/10/21 ?Patient Name: Randy Andersen ?Patient MRN: [id number] ?Patient DOB: 1956/04/16 ?Provider: Ventura Sellers, MD ? ?Identifying Statement:  ?Randy Andersen is a 66 y.o. male with Malignant neoplasm metastatic to brain [facility])  ? ?Primary Cancer: ? ?Oncologic History: ?Oncology History  ?Malignant neoplasm of upper lobe of right lung ([place])  ?02/19/2020 Initial Diagnosis  ? Malignant neoplasm of upper lobe of right lung ([place]) ? ?  ?04/07/2020 -  Chemotherapy  ? Patient is on Treatment Plan : LUNG CARBOplatin / Pemetrexed / Pembrolizumab q21d Induction x 4 cycles / Maintenance Pemetrexed + Pembrolizumab  ? ?  ?  ? ?CNS Oncologic History: ?03/18/20: SRS to two supratentorial metastases [name]) ? ?Interval History: ?Randy Andersen presents today for follow up after completing recent MRI study.  No new or progressives symptoms, numbness along inside aspect of right hand is stable.  Some issues with fatigue, memory.  Denies headaches, seizures. ? ?H+P (12/21/20) Patient presents for follow up after recent MRI.  He complains of short term memory impairment, cognitive "slowing" since starting chemotherapy last year.  He also describes some difficulty getting words out or finding the correct words at times.  No issues with comprehension.  He is otherwise functional, independent.  He is able to cook, pay the bills, drives himself.  Currently on immuotherapy with Dr. Julien Nordmann.  Does acknowledge stroke ~10 years ago which affected his right sided sensation.  ? ?Medications: ?Current Outpatient Medications on File Prior to Visit  ?Medication Sig Dispense Refill  ? amLODipine (NORVASC) 10 MG tablet Take 10 mg by mouth daily.    ? Cholecalciferol 100 MCG (4000 UT) TABS Take 2 tablets by mouth daily.    ? diclofenac Sodium (VOLTAREN) 1 % GEL Apply 4 g topically 3 (three) times daily.    ?  doxylamine, Sleep, (UNISOM) 25 MG tablet Take 1 tablet (25 mg total) by mouth at bedtime as needed for sleep. 30 tablet 0  ? HYDROcodone-acetaminophen (NORCO) 10-325 MG tablet Take 1 tablet by mouth 2 (two) times daily.    ? oxybutynin (DITROPAN-XL) 10 MG 24 hr tablet Take by mouth.    ? polyethylene glycol powder (GLYCOLAX/MIRALAX) 17 GM/SCOOP powder     ? potassium chloride SA (KLOR-CON M) 20 MEQ tablet Take 1 tablet (20 mEq total) by mouth daily. 5 tablet 0  ? prochlorperazine (COMPAZINE) 10 MG tablet Take 1 tablet (10 mg total) by mouth every 6 (six) hours as needed. 30 tablet 2  ? rivaroxaban (XARELTO) 20 MG TABS tablet TAKE 1 TABLET BY MOUTH ONCE A DAY WITH SUPPER 30 tablet 2  ? senna-docusate (SENOKOT-S) 8.6-50 MG tablet Take 2 tablets by mouth daily.    ? tadalafil (CIALIS) 20 MG tablet Take by mouth.    ? tamsulosin (FLOMAX) 0.4 MG CAPS capsule Take 1 capsule (0.4 mg total) by mouth daily. 30 capsule 2  ? ?No current facility-administered medications on file prior to visit.  ? ? ?Allergies: No Known Allergies ?Past Medical History:  ?Past Medical History:  ?Diagnosis Date  ? Asthma   ? as a child  ? Chronic pain disorder   ? LT leg and foot  ? Heart murmur   ? as a child  ? Hypertension   ? 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 Stroke [facility])   ? mini stroke -  2015, some tingling in fingers on right   ? ?Past Surgical History:  ?Past Surgical History:  ?Procedure Laterality Date  ? BUNIONECTOMY Left   ? had hammer toe surgery also and callus removed  ? BUNIONECTOMY Right   ? also had hammer toe surgery and callus removed  ? LEG SURGERY Left   ? PT reports he has pins placed in his Lt leg  ? ORIF TIBIA PLATEAU  10/09/2012  ? Dr Lorin Mercy  ? ORIF TIBIA PLATEAU Left 10/08/2012  ? Procedure: OPEN REDUCTION INTERNAL FIXATION (ORIF) TIBIAL PLATEAU;  Surgeon: Marybelle Killings, MD;  Location: [place];  Service: Orthopedics;  Laterality: Left;  ? VIDEO BRONCHOSCOPY WITH ENDOBRONCHIAL NAVIGATION N/A 02/25/2020  ?  Procedure: VIDEO BRONCHOSCOPY WITH ENDOBRONCHIAL NAVIGATION with biopsies;  Surgeon: Collene Gobble, MD;  Location: [facility] OR;  Service: Thoracic;  Laterality: N/A;  ? VIDEO BRONCHOSCOPY WITH ENDOBRONCHIAL ULTRASOUND N/A 02/25/2020  ? Procedure: VIDEO BRONCHOSCOPY WITH ENDOBRONCHIAL ULTRASOUND;  Surgeon: Collene Gobble, MD;  Location: [place];  Service: Thoracic;  Laterality: N/A;  ? ?Social History:  ?Social History  ? ?Socioeconomic History  ? Marital status: Single  ?  Spouse name: Not on file  ? Number of children: Not on file  ? Years of education: Not on file  ? Highest education level: Not on file  ?Occupational History  ? Not on file  ?Tobacco Use  ? Smoking status: Every Day  ?  Packs/day: 2.00  ?  Years: 28.00  ?  Pack years: 56.00  ?  Types: Cigarettes  ? Smokeless tobacco: Never  ?Vaping Use  ? Vaping Use: Never used  ?Substance and Sexual Activity  ? Alcohol use: No  ? Drug use: No  ? Sexual activity: Not on file  ?Other Topics Concern  ? Not on file  ?Social History Narrative  ? Not on file  ? ?Social Determinants of Health  ? ?Financial Resource Strain: Not on file  ?Food Insecurity: Not on file  ?Transportation Needs: Not on file  ?Physical Activity: Not on file  ?Stress: Not on file  ?Social Connections: Not on file  ?Intimate Partner Violence: Not on file  ? ?Family History:  ?Family History  ?Problem Relation Age of Onset  ? Cancer Mother   ?     Intestinal metastatic to ovaries  ? ? ?Review of Systems: ?Constitutional: Doesn't report fevers, chills or abnormal weight loss ?Eyes: Doesn't report blurriness of vision ?Ears, nose, mouth, throat, and face: Doesn't report sore throat ?Respiratory: Doesn't report cough, dyspnea or wheezes ?Cardiovascular: Doesn't report palpitation, chest discomfort  ?Gastrointestinal:  Doesn't report nausea, constipation, diarrhea ?GU: Doesn't report incontinence ?Skin: Doesn't report skin rashes ?Neurological: Per HPI ?Musculoskeletal: Doesn't report joint  pain ?Behavioral/Psych: Doesn't report anxiety ? ?Physical Exam: ?Vitals:  ? 11/10/21 1003  ?BP: (!) 154/80  ?Pulse: 80  ?Resp: 18  ?Temp: 98.8 ?F (37.1 ?C)  ?SpO2: 100%  ? ? ?KPS: 80. ?General: Alert, cooperative, pleasant, in no acute distress ?Head: Normal ?EENT: No conjunctival injection or scleral icterus.  ?Lungs: Resp effort normal ?Cardiac: Regular rate ?Abdomen: Non-distended abdomen ?Skin: No rashes cyanosis or petechiae. ?Extremities: No clubbing or edema ? ?Neurologic Exam: ?Mental Status: Awake, alert, attentive to examiner. Oriented to self and environment. Language is fluent with intact comprehension.  Some psychomotor slowing. ?Cranial Nerves: Visual acuity is grossly normal. Visual fields are full. Extra-ocular movements intact. No ptosis. Face is symmetric ?Motor: Tone and bulk are normal. Power is full in both  arms and legs. Reflexes are symmetric, no pathologic reflexes present.  ?Sensory: Intact to light touch ?Gait: Normal. ? ? ?Labs: ?I have reviewed the data as listed ?   ?Component Value Date/Time  ? NA 138 10/31/2021 1412  ? K 3.2 (L) 10/31/2021 1412  ? CL 105 10/31/2021 1412  ? CO2 23 10/31/2021 1412  ? GLUCOSE 113 (H) 10/31/2021 1412  ? BUN 17 10/31/2021 1412  ? CREATININE 1.61 (H) 10/31/2021 1412  ? CALCIUM 9.7 10/31/2021 1412  ? PROT 9.1 (H) 10/31/2021 1412  ? ALBUMIN 4.1 10/31/2021 1412  ? AST 12 (L) 10/31/2021 1412  ? ALT 13 10/31/2021 1412  ? ALKPHOS 128 (H) 10/31/2021 1412  ? BILITOT 0.5 10/31/2021 1412  ? GFRNONAA 47 (L) 10/31/2021 1412  ? GFRAA 52 (L) 04/21/2020 1403  ? ?Lab Results  ?Component Value Date  ? WBC 7.1 10/31/2021  ? NEUTROABS 4.8 10/31/2021  ? HGB 14.4 10/31/2021  ? HCT 44.3 10/31/2021  ? MCV 84.9 10/31/2021  ? PLT 297 10/31/2021  ? ? ?Imaging: ? ?MR Brain W Wo Contrast ? ?Result Date: 11/03/2021 ?CLINICAL DATA:  Follow-up brain metastatic lesions, lung cancer EXAM: MRI HEAD WITHOUT AND WITH CONTRAST TECHNIQUE: Multiplanar, multiecho pulse sequences of the brain and  surrounding structures were obtained without and with intravenous contrast. CONTRAST:  2mL MULTIHANCE GADOBENATE DIMEGLUMINE 529 MG/ML IV SOLN COMPARISON:  Most recent brain MRI 08/03/2021 FINDINGS: Brain: The 0.8 cm T

## 2021-11-17 ENCOUNTER — Ambulatory Visit (HOSPITAL_COMMUNITY)
Admission: RE | Admit: 2021-11-17 | Discharge: 2021-11-17 | Disposition: A | Discharge status: Home / Self Care | Payer: Medicare PPO | Source: Ambulatory Visit | Attending: Physician Assistant | Admitting: Physician Assistant

## 2021-11-17 DIAGNOSIS — R918 Other nonspecific abnormal finding of lung field: Secondary | ICD-10-CM | POA: Diagnosis not present

## 2021-11-17 DIAGNOSIS — I700 Atherosclerosis of aorta: Secondary | ICD-10-CM | POA: Diagnosis not present

## 2021-11-17 DIAGNOSIS — C3490 Malignant neoplasm of unspecified part of unspecified bronchus or lung: Secondary | ICD-10-CM | POA: Diagnosis not present

## 2021-11-17 DIAGNOSIS — C3411 Malignant neoplasm of upper lobe, right bronchus or lung: Secondary | ICD-10-CM | POA: Diagnosis not present

## 2021-11-17 DIAGNOSIS — N400 Enlarged prostate without lower urinary tract symptoms: Secondary | ICD-10-CM | POA: Diagnosis not present

## 2021-11-17 IMAGING — CT CT CHEST W/O CM
2 of 4 series · 13 of 36 positions shown, 16 images · non-contrast
Comparison: Multiple exams, including [DATE]

CLINICAL DATA: Metastatic non-small cell lung cancer restaging.

* Tracking Code: BO *
EXAM:
CT CHEST, ABDOMEN AND PELVIS WITHOUT CONTRAST
TECHNIQUE: Multidetector CT imaging of the chest, abdomen and pelvis was
performed following the standard protocol without IV contrast.
RADIATION DOSE REDUCTION: This exam was performed according to the
departmental dose-optimization program which includes automated
exposure control, adjustment of the mA and/or kV according to
patient size and/or use of iterative reconstruction technique.

[Series 2: cap w/o · axial · non-contrast · 0.93mm/px · z∈[-264,+266]mm · 10 of 130 slices shown, 13 images]
[im 12/130  mediastinal]
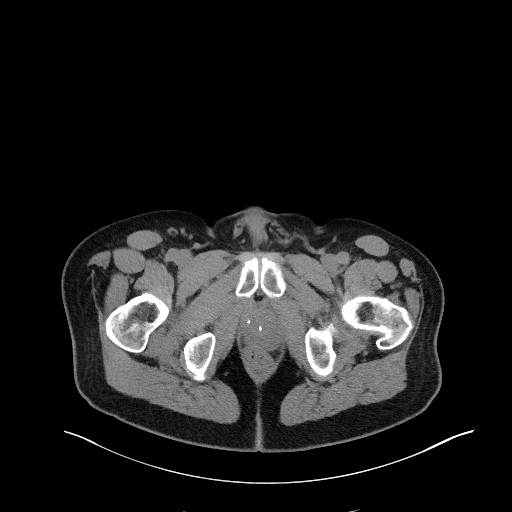
[im 12/130  lung]
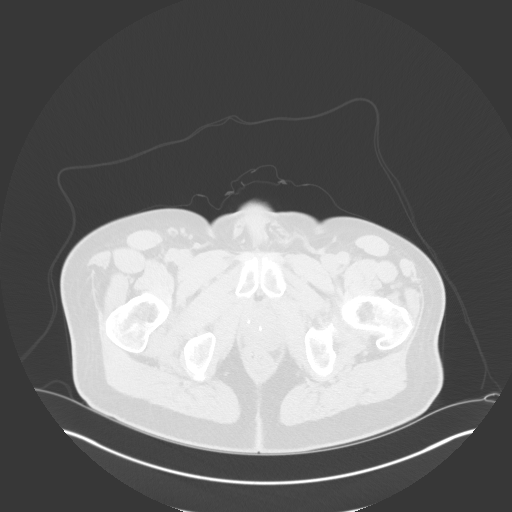
[im 24/130  lung]
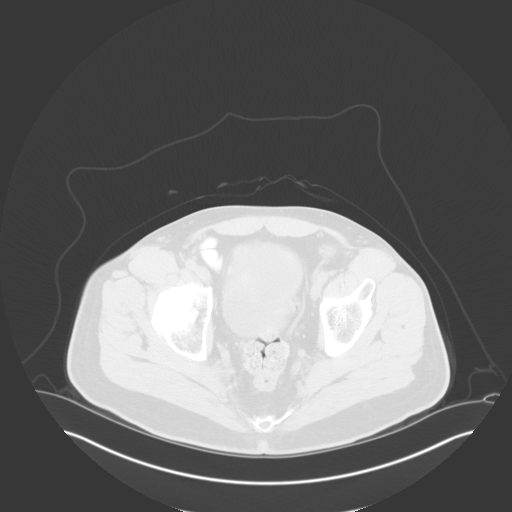
[im 36/130  lung]
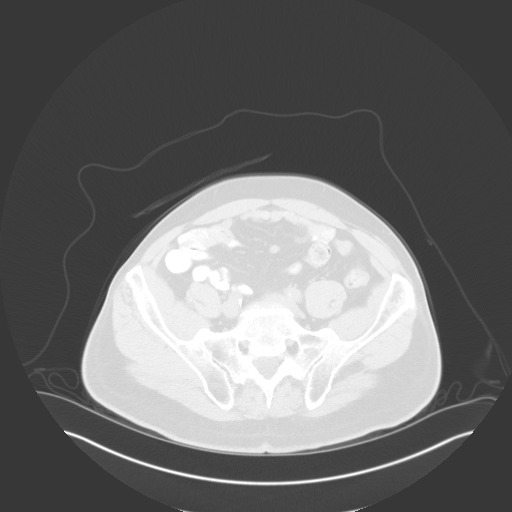
[im 47/130  lung]
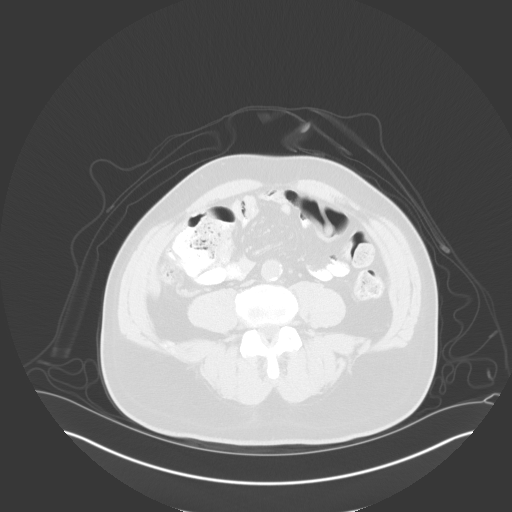
[im 59/130  mediastinal]
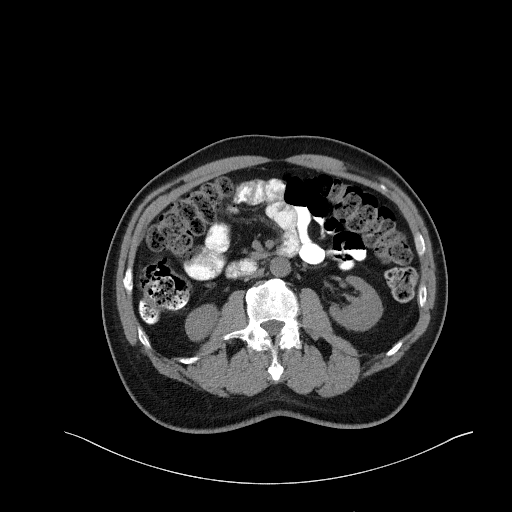
[im 59/130  lung]
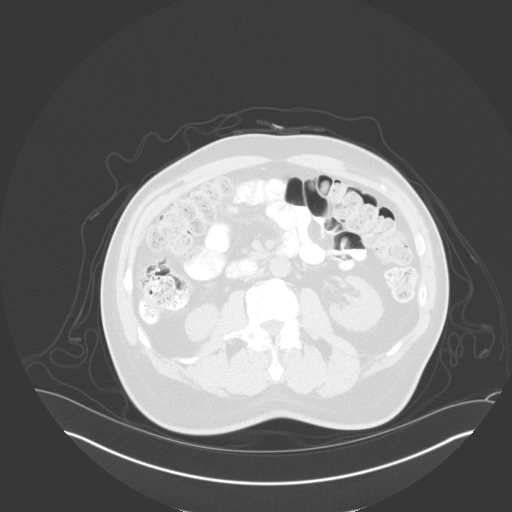
[im 71/130  lung]
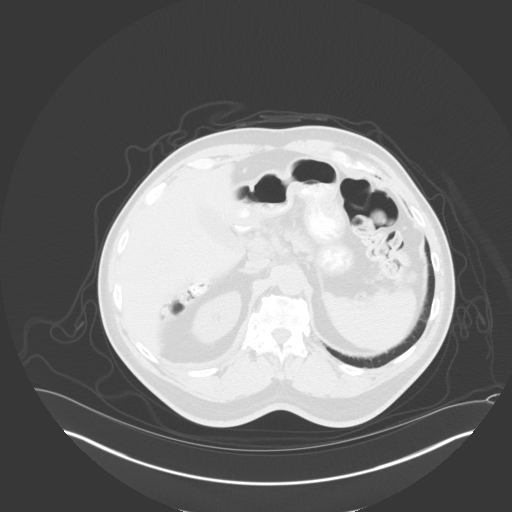
[im 83/130  lung]
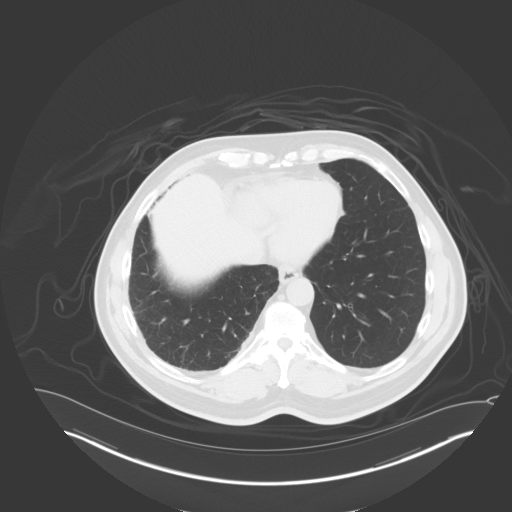
[im 94/130  lung]
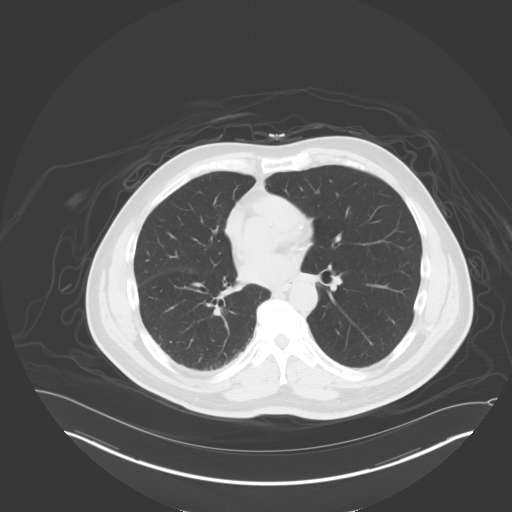
[im 106/130  mediastinal]
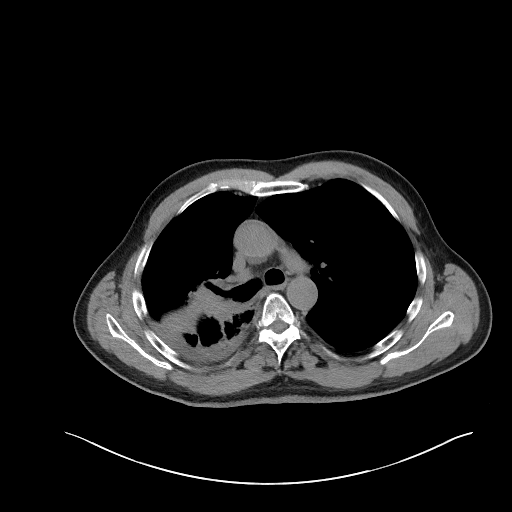
[im 106/130  lung]
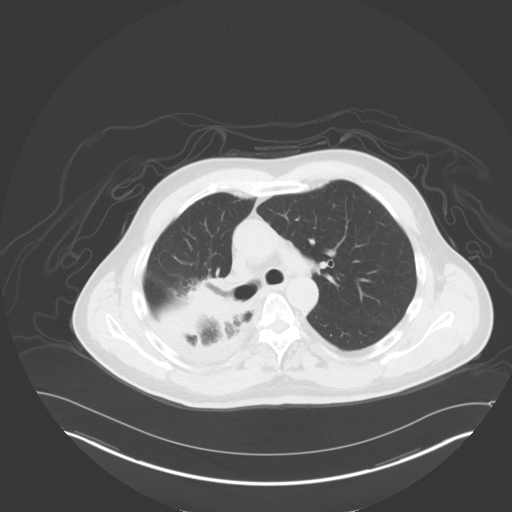
[im 118/130  lung]
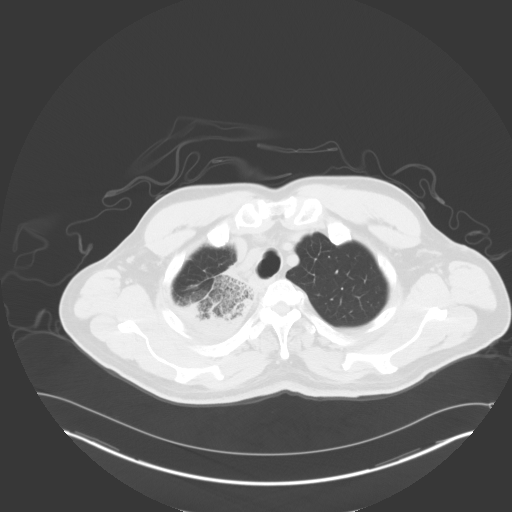

[Series 5: coronals · coronal · 0.91mm/px · 3 of 165 slices shown]
[im 33/165  lung]
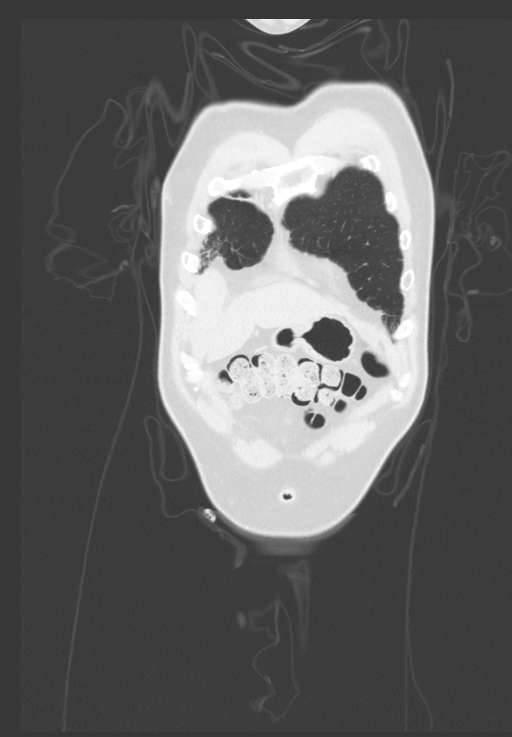
[im 66/165  lung]
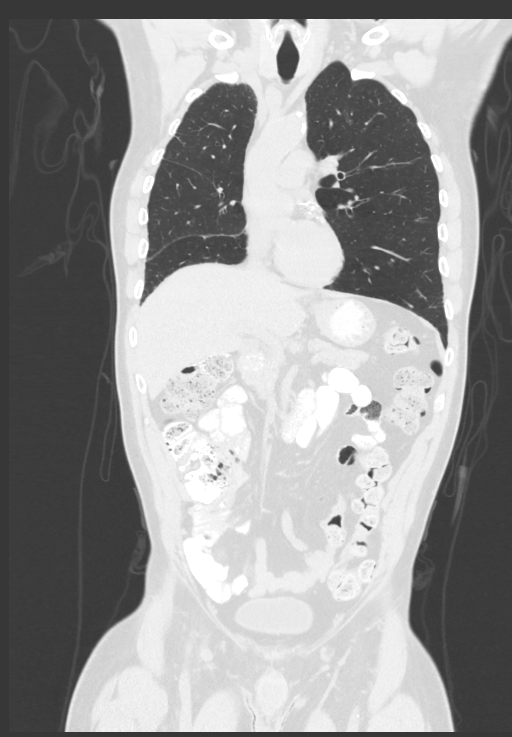
[im 99/165  lung]
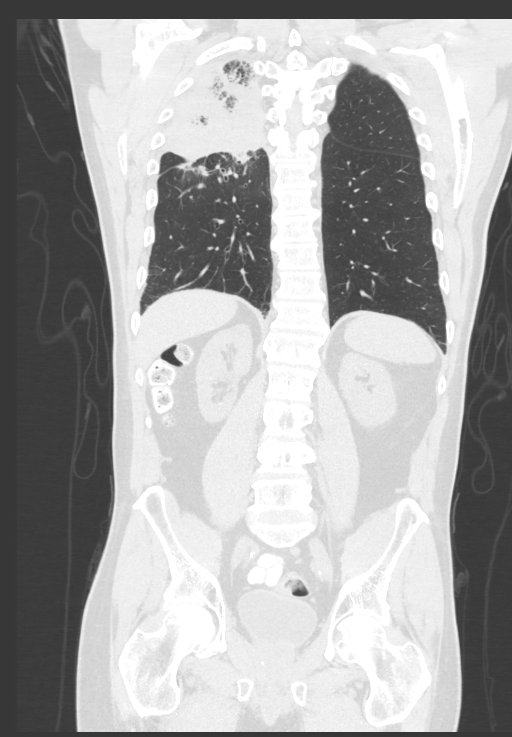

[13 of 36 positions shown; findings below may reference images not displayed]

FINDINGS: CT CHEST FINDINGS

Cardiovascular: Coronary, aortic arch, and branch vessel
atherosclerotic vascular disease.

Mediastinum/Nodes: No pathologic adenopathy in the chest.

Lungs/Pleura: Occluded posterior segmental bronchus of the right
upper lobe with progressive/worsening airspace opacity in the
posterior segment right upper lobe, apical segment right upper lobe,
and in the superior segment right lower lobe compared to [DATE].
Increase consolidation and increased region of reticular
interstitial and ground-glass opacities in the right upper lobe as
best I can discern the patient's last radiation therapy to the right
upper lobe was [DATE].

Stable localized subpleural reticulation inferiorly in the right
middle lobe and inferiorly in the right lower lobe.

6 by 4 mm left upper lobe sub solid nodule on image 55 series 4,
stable.

Faint 9 by 8 mm sub solid nodule in the left upper lobe on image 96
of series 4, previously in this vicinity there is a 7 by 6 mm nodule
with greater solidity.

Anterior right upper lobe nodule 0.7 by 0.3 cm on image 54 series 4,
stable.

Musculoskeletal: Mild thoracic spondylosis. Thirteen paired ribs as
noted on prior exam.

CT ABDOMEN PELVIS FINDINGS

Hepatobiliary: Unremarkable

Pancreas: Unremarkable

Spleen: Unremarkable

Adrenals/Urinary Tract: Stable probable cyst of the right kidney
upper pole. Adrenal glands unremarkable.

Stomach/Bowel: Prominent stool throughout the colon favors
constipation.

Vascular/Lymphatic: Atherosclerosis is present, including aortoiliac
atherosclerotic disease. No pathologic adenopathy identified.

Reproductive: Mild prostatomegaly with a slightly lobulated
appearing prostate gland.

Other: No supplemental non-categorized findings.

Musculoskeletal: Bridging spurring of both sacroiliac joints. Facet
arthropathy contributing to bilateral foraminal impingement at the
lumbosacral junction.
IMPRESSION: 1. Progressive airspace opacity in the right upper lobe and superior
segment right lower lobe. The patient has had prior radiation
therapy in the region back on [DATE], and the somewhat
inflammatory appearance tends to favor sequela of radiation therapy
over malignancy although a component of recurrent malignancy within
the consolidation is difficult to confidently exclude. As I noted
previously, it is somewhat unusual to have progressive airspace
opacities this far out from radiation therapy. There is occlusion of
the posterior segmental bronchus of the right upper lobe and the
possibility of postobstructive pneumonia or pneumonitis is raised.
Multidisciplinary cancer conference may be a reasonable venue for
discussion of options with regard to further diagnosis and therapy.
2. Stable 7 by 3 mm nodule anteriorly in the right upper lobe.
3. At the site of the previous 7 by 6 mm left upper lobe nodule,
there is currently only a faint 9 by 8 mm sub solid density in the
lung. Surveillance suggested.
4. Other imaging findings of potential clinical significance: Aortic
Atherosclerosis ([X0]-[X0]). Coronary and systemic
atherosclerosis. Prominent stool throughout the colon favors
constipation. Prostatomegaly. Bilateral foraminal impingement at
L5-S1.

## 2021-11-18 ENCOUNTER — Other Ambulatory Visit (HOSPITAL_COMMUNITY): Payer: Self-pay

## 2021-11-21 ENCOUNTER — Telehealth: Payer: Self-pay | Admitting: Internal Medicine

## 2021-11-21 NOTE — Progress Notes (Deleted)
[facility] OFFICE PROGRESS NOTE  [name], MD [address] [place] [address]  DIAGNOSIS: Stage IV non-small cell lung cancer, favored to be adenocarcinoma.  He presented with posterior right upper lobe nodule as well as right hilar, infrahilar, subcarinal, and right paratracheal lymphadenopathy.  He also has a 1.4 cm x 1 cm soft tissue nodule in the skin/subcutaneous fat in the left axilla.  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  He was diagnosed in August 2021   Molecular Biomarkers: BIOMARKER(S)         % CFDNA OR AMPLIFICATION       ASSOCIATED FDA-APPROVED THERAPIES        CLINICAL TRIAL AVAILABILITY [id number] 2.9% None    Yes [id number] 0.4% None    Yes [id number] 0.2% None    Yes  PRIOR THERAPY:  CURRENT THERAPY:  INTERVAL HISTORY: Randy Andersen 66 y.o. male returns for *** regular *** visit for followup of ***    of very high risk for functional impairment with further progression in this location, and unclear etiology of progression, we recommended repeating an MRI brain in 1 month.  He will 'pre-dose' with 10 day course of dexamethasone 4mg  daily.  If lesion stabilizes from steroids, likelihood of radionecrosis increases significantly.  If organic tumor is suspected, will then consider re-irradiation.  He is not a surgical or even biopsy candidate given the location.  Lesion is too small at this time for PET or perfusion imaging. Follow up MRI 1 month.  focus of progression within previously treated left pre-central gyrus metastasis.  Etiology is either radiation treatment effect (favored) or tumor progression.  Again, other treated lesions are stable.  Despite the eloquent location he has no motor deficits at this time.  MEDICAL HISTORY: Past Medical History:  Diagnosis Date   Asthma    as a child   Chronic pain disorder    LT leg and foot   Heart murmur    as a child   Hypertension    Malignant  neoplasm of upper lobe of right lung ([place])    Stroke ([place])    mini stroke - 2015, some tingling in fingers on right     ALLERGIES:  has No Known Allergies.  MEDICATIONS:  Current Outpatient Medications  Medication Sig Dispense Refill   amLODipine (NORVASC) 10 MG tablet Take 10 mg by mouth daily.     Cholecalciferol 100 MCG (4000 UT) TABS Take 2 tablets by mouth daily.     [START ON 11/30/2021] dexamethasone (DECADRON) 4 MG tablet Take 1 tablet by mouth daily. 10 tablet 0   diclofenac Sodium (VOLTAREN) 1 % GEL Apply 4 g topically 3 (three) times daily.     doxylamine, Sleep, (UNISOM) 25 MG tablet Take 1 tablet (25 mg total) by mouth at bedtime as needed for sleep. 30 tablet 0   HYDROcodone-acetaminophen (NORCO) 10-325 MG tablet Take 1 tablet by mouth 2 (two) times daily.     oxybutynin (DITROPAN-XL) 10 MG 24 hr tablet Take by mouth.     polyethylene glycol powder (GLYCOLAX/MIRALAX) 17 GM/SCOOP powder      potassium chloride SA (KLOR-CON M) 20 MEQ tablet Take 1 tablet (20 mEq total) by mouth daily. 5 tablet 0   prochlorperazine (COMPAZINE) 10 MG tablet Take 1 tablet (10 mg total) by mouth every 6 (six) hours as needed. 30 tablet 2   rivaroxaban (XARELTO) 20 MG TABS tablet TAKE 1 TABLET BY MOUTH  ONCE A DAY WITH SUPPER 30 tablet 2   senna-docusate (SENOKOT-S) 8.6-50 MG tablet Take 2 tablets by mouth daily.     tadalafil (CIALIS) 20 MG tablet Take by mouth.     tamsulosin (FLOMAX) 0.4 MG CAPS capsule Take 1 capsule (0.4 mg total) by mouth daily. 30 capsule 2   No current facility-administered medications for this visit.    SURGICAL HISTORY:  Past Surgical History:  Procedure Laterality Date   BUNIONECTOMY Left    had hammer toe surgery also and callus removed   BUNIONECTOMY Right    also had hammer toe surgery and callus removed   LEG SURGERY Left    PT reports he has pins placed in his Lt leg   ORIF TIBIA PLATEAU  10/09/2012   Dr Lorin Mercy   ORIF TIBIA PLATEAU Left 10/08/2012    Procedure: OPEN REDUCTION INTERNAL FIXATION (ORIF) TIBIAL PLATEAU;  Surgeon: Marybelle Killings, MD;  Location: [facility];  Service: Orthopedics;  Laterality: Left;   VIDEO BRONCHOSCOPY WITH ENDOBRONCHIAL NAVIGATION N/A 02/25/2020   Procedure: VIDEO BRONCHOSCOPY WITH ENDOBRONCHIAL NAVIGATION with biopsies;  Surgeon: Collene Gobble, MD;  Location: [facility];  Service: Thoracic;  Laterality: N/A;   VIDEO BRONCHOSCOPY WITH ENDOBRONCHIAL ULTRASOUND N/A 02/25/2020   Procedure: VIDEO BRONCHOSCOPY WITH ENDOBRONCHIAL ULTRASOUND;  Surgeon: Collene Gobble, MD;  Location: [facility];  Service: Thoracic;  Laterality: N/A;    REVIEW OF SYSTEMS:   Review of Systems  Constitutional: Negative for appetite change, chills, fatigue, fever and unexpected weight change.  HENT:   Negative for mouth sores, nosebleeds, sore throat and trouble swallowing.   Eyes: Negative for eye problems and icterus.  Respiratory: Negative for cough, hemoptysis, shortness of breath and wheezing.   Cardiovascular: Negative for chest pain and leg swelling.  Gastrointestinal: Negative for abdominal pain, constipation, diarrhea, nausea and vomiting.  Genitourinary: Negative for bladder incontinence, difficulty urinating, dysuria, frequency and hematuria.   Musculoskeletal: Negative for back pain, gait problem, neck pain and neck stiffness.  Skin: Negative for itching and rash.  Neurological: Negative for dizziness, extremity weakness, gait problem, headaches, light-headedness and seizures.  Hematological: Negative for adenopathy. Does not bruise/bleed easily.  Psychiatric/Behavioral: Negative for confusion, depression and sleep disturbance. The patient is not nervous/anxious.     PHYSICAL EXAMINATION:  There were no vitals taken for this visit.  ECOG PERFORMANCE STATUS: {CHL ONC ECOG [id number]  Physical Exam  Constitutional: Oriented to person, place, and time and well-developed, well-nourished, and in no distress. No distress.  HENT:  Head:  Normocephalic and atraumatic.  Mouth/Throat: Oropharynx is clear and moist. No oropharyngeal exudate.  Eyes: Conjunctivae are normal. Right eye exhibits no discharge. Left eye exhibits no discharge. No scleral icterus.  Neck: Normal range of motion. Neck supple.  Cardiovascular: Normal rate, regular rhythm, normal heart sounds and intact distal pulses.   Pulmonary/Chest: Effort normal and breath sounds normal. No respiratory distress. No wheezes. No rales.  Abdominal: Soft. Bowel sounds are normal. Exhibits no distension and no mass. There is no tenderness.  Musculoskeletal: Normal range of motion. Exhibits no edema.  Lymphadenopathy:    No cervical adenopathy.  Neurological: Alert and oriented to person, place, and time. Exhibits normal muscle tone. Gait normal. Coordination normal.  Skin: Skin is warm and dry. No rash noted. Not diaphoretic. No erythema. No pallor.  Psychiatric: Mood, memory and judgment normal.  Vitals reviewed.  LABORATORY DATA: Lab Results  Component Value Date   WBC 7.1 10/31/2021   HGB 14.4 10/31/2021  HCT 44.3 10/31/2021   MCV 84.9 10/31/2021   PLT 297 10/31/2021      Chemistry      Component Value Date/Time   NA 138 10/31/2021 1412   K 3.2 (L) 10/31/2021 1412   CL 105 10/31/2021 1412   CO2 23 10/31/2021 1412   BUN 17 10/31/2021 1412   CREATININE 1.61 (H) 10/31/2021 1412      Component Value Date/Time   CALCIUM 9.7 10/31/2021 1412   ALKPHOS 128 (H) 10/31/2021 1412   AST 12 (L) 10/31/2021 1412   ALT 13 10/31/2021 1412   BILITOT 0.5 10/31/2021 1412       RADIOGRAPHIC STUDIES:  CT Abdomen Pelvis Wo Contrast  Result Date: 11/18/2021 CLINICAL DATA:  Metastatic non-small cell lung cancer restaging. * Tracking Code: BO * EXAM: CT CHEST, ABDOMEN AND PELVIS WITHOUT CONTRAST TECHNIQUE: Multidetector CT imaging of the chest, abdomen and pelvis was performed following the standard protocol without IV contrast. RADIATION DOSE REDUCTION: This exam was  performed according to the departmental dose-optimization program which includes automated exposure control, adjustment of the mA and/or kV according to patient size and/or use of iterative reconstruction technique. COMPARISON:  Multiple exams, including 09/16/2021 FINDINGS: CT CHEST FINDINGS Cardiovascular: Coronary, aortic arch, and branch vessel atherosclerotic vascular disease. Mediastinum/Nodes: No pathologic adenopathy in the chest. Lungs/Pleura: Occluded posterior segmental bronchus of the right upper lobe with progressive/worsening airspace opacity in the posterior segment right upper lobe, apical segment right upper lobe, and in the superior segment right lower lobe compared to 09/16/2021. Increase consolidation and increased region of reticular interstitial and ground-glass opacities in the right upper lobe as best I can discern the patient's last radiation therapy to the right upper lobe was 04/06/2021. Stable localized subpleural reticulation inferiorly in the right middle lobe and inferiorly in the right lower lobe. 6 by 4 mm left upper lobe sub solid nodule on image 55 series 4, stable. Faint 9 by 8 mm sub solid nodule in the left upper lobe on image 96 of series 4, previously in this vicinity there is a 7 by 6 mm nodule with greater solidity. Anterior right upper lobe nodule 0.7 by 0.3 cm on image 54 series 4, stable. Musculoskeletal: Mild thoracic spondylosis. Thirteen paired ribs as noted on prior exam. CT ABDOMEN PELVIS FINDINGS Hepatobiliary: Unremarkable Pancreas: Unremarkable Spleen: Unremarkable Adrenals/Urinary Tract: Stable probable cyst of the right kidney upper pole. Adrenal glands unremarkable. Stomach/Bowel: Prominent stool throughout the colon favors constipation. Vascular/Lymphatic: Atherosclerosis is present, including aortoiliac atherosclerotic disease. No pathologic adenopathy identified. Reproductive: Mild prostatomegaly with a slightly lobulated appearing prostate gland. Other: No  supplemental non-categorized findings. Musculoskeletal: Bridging spurring of both sacroiliac joints. Facet arthropathy contributing to bilateral foraminal impingement at the lumbosacral junction. IMPRESSION: 1. Progressive airspace opacity in the right upper lobe and superior segment right lower lobe. The patient has had prior radiation therapy in the region back on 04/06/2021, and the somewhat inflammatory appearance tends to favor sequela of radiation therapy over malignancy although a component of recurrent malignancy within the consolidation is difficult to confidently exclude. As I noted previously, it is somewhat unusual to have progressive airspace opacities this far out from radiation therapy. There is occlusion of the posterior segmental bronchus of the right upper lobe and the possibility of postobstructive pneumonia or pneumonitis is raised. Multidisciplinary cancer conference may be a reasonable venue for discussion of options with regard to further diagnosis and therapy. 2. Stable 7 by 3 mm nodule anteriorly in the right upper lobe. 3.  At the site of the previous 7 by 6 mm left upper lobe nodule, there is currently only a faint 9 by 8 mm sub solid density in the lung. Surveillance suggested. 4. Other imaging findings of potential clinical significance: Aortic Atherosclerosis (ICD10-I70.0). Coronary and systemic atherosclerosis. Prominent stool throughout the colon favors constipation. Prostatomegaly. Bilateral foraminal impingement at L5-S1. Electronically Signed   By: Van Clines M.D.   On: 11/18/2021 09:44   CT Chest Wo Contrast  Result Date: 11/18/2021 CLINICAL DATA:  Metastatic non-small cell lung cancer restaging. * Tracking Code: BO * EXAM: CT CHEST, ABDOMEN AND PELVIS WITHOUT CONTRAST TECHNIQUE: Multidetector CT imaging of the chest, abdomen and pelvis was performed following the standard protocol without IV contrast. RADIATION DOSE REDUCTION: This exam was performed according to the  departmental dose-optimization program which includes automated exposure control, adjustment of the mA and/or kV according to patient size and/or use of iterative reconstruction technique. COMPARISON:  Multiple exams, including 09/16/2021 FINDINGS: CT CHEST FINDINGS Cardiovascular: Coronary, aortic arch, and branch vessel atherosclerotic vascular disease. Mediastinum/Nodes: No pathologic adenopathy in the chest. Lungs/Pleura: Occluded posterior segmental bronchus of the right upper lobe with progressive/worsening airspace opacity in the posterior segment right upper lobe, apical segment right upper lobe, and in the superior segment right lower lobe compared to 09/16/2021. Increase consolidation and increased region of reticular interstitial and ground-glass opacities in the right upper lobe as best I can discern the patient's last radiation therapy to the right upper lobe was 04/06/2021. Stable localized subpleural reticulation inferiorly in the right middle lobe and inferiorly in the right lower lobe. 6 by 4 mm left upper lobe sub solid nodule on image 55 series 4, stable. Faint 9 by 8 mm sub solid nodule in the left upper lobe on image 96 of series 4, previously in this vicinity there is a 7 by 6 mm nodule with greater solidity. Anterior right upper lobe nodule 0.7 by 0.3 cm on image 54 series 4, stable. Musculoskeletal: Mild thoracic spondylosis. Thirteen paired ribs as noted on prior exam. CT ABDOMEN PELVIS FINDINGS Hepatobiliary: Unremarkable Pancreas: Unremarkable Spleen: Unremarkable Adrenals/Urinary Tract: Stable probable cyst of the right kidney upper pole. Adrenal glands unremarkable. Stomach/Bowel: Prominent stool throughout the colon favors constipation. Vascular/Lymphatic: Atherosclerosis is present, including aortoiliac atherosclerotic disease. No pathologic adenopathy identified. Reproductive: Mild prostatomegaly with a slightly lobulated appearing prostate gland. Other: No supplemental  non-categorized findings. Musculoskeletal: Bridging spurring of both sacroiliac joints. Facet arthropathy contributing to bilateral foraminal impingement at the lumbosacral junction. IMPRESSION: 1. Progressive airspace opacity in the right upper lobe and superior segment right lower lobe. The patient has had prior radiation therapy in the region back on 04/06/2021, and the somewhat inflammatory appearance tends to favor sequela of radiation therapy over malignancy although a component of recurrent malignancy within the consolidation is difficult to confidently exclude. As I noted previously, it is somewhat unusual to have progressive airspace opacities this far out from radiation therapy. There is occlusion of the posterior segmental bronchus of the right upper lobe and the possibility of postobstructive pneumonia or pneumonitis is raised. Multidisciplinary cancer conference may be a reasonable venue for discussion of options with regard to further diagnosis and therapy. 2. Stable 7 by 3 mm nodule anteriorly in the right upper lobe. 3. At the site of the previous 7 by 6 mm left upper lobe nodule, there is currently only a faint 9 by 8 mm sub solid density in the lung. Surveillance suggested. 4. Other imaging findings of potential clinical  significance: Aortic Atherosclerosis (ICD10-I70.0). Coronary and systemic atherosclerosis. Prominent stool throughout the colon favors constipation. Prostatomegaly. Bilateral foraminal impingement at L5-S1. Electronically Signed   By: Van Clines M.D.   On: 11/18/2021 09:44   MR Brain W Wo Contrast  Result Date: 11/03/2021 CLINICAL DATA:  Follow-up brain metastatic lesions, lung cancer EXAM: MRI HEAD WITHOUT AND WITH CONTRAST TECHNIQUE: Multiplanar, multiecho pulse sequences of the brain and surrounding structures were obtained without and with intravenous contrast. CONTRAST:  74mL MULTIHANCE GADOBENATE DIMEGLUMINE 529 MG/ML IV SOLN COMPARISON:  Most recent brain MRI  08/03/2021 FINDINGS: Brain: The 0.8 cm TV x 0.7 cm AP peripherally enhancing lesion in the left precentral gyrus is increased in size since 08/03/2021 when it measured up to 0.5 cm. Surrounding edema has also increased, but there is no regional mass effect. A small focus of SWI signal dropout may reflect a small amount of intralesional blood products. No other enhancing lesions are seen. There is no acute intracranial hemorrhage, extra-axial fluid collection, or acute infarct. Background parenchymal volume is normal. The ventricles are stable in size. Remote infarcts in the bilateral basal ganglia and left caudate infarcts are unchanged, with stable ex vacuo dilatation of the left lateral ventricle. There is no midline shift. Vascular: Normal flow voids. Skull and upper cervical spine: Normal marrow signal. Sinuses/Orbits: The paranasal sinuses are clear. The globes and orbits are unremarkable. Other: None. IMPRESSION: 1. Increased size of the metastatic lesion in the left precentral gyrus, now measuring up to 0.8 cm (previously 0.4 cm) with increased surrounding edema. 2. No other lesions identified. Electronically Signed   By: Valetta Mole M.D.   On: 11/03/2021 14:34     ASSESSMENT/PLAN:  No problem-specific Assessment & Plan notes found for this encounter.    of very high risk for functional impairment with further progression in this location, and unclear etiology of progression, we recommended repeating an MRI brain in 1 month.  He will 'pre-dose' with 10 day course of dexamethasone 4mg  daily.  If lesion stabilizes from steroids, likelihood of radionecrosis increases significantly. No orders of the defined types were placed in this encounter.    I spent {CHL ONC TIME VISIT - [id number]} counseling the patient face to face. The total time spent in the appointment was {CHL ONC TIME VISIT - [id number]}.  Nel Stoneking L Lavonda Thal, PA-C 11/21/21

## 2021-11-21 NOTE — Telephone Encounter (Signed)
Called patient regarding upcoming appointment, patient is notified. °

## 2021-11-22 ENCOUNTER — Inpatient Hospital Stay: Payer: No Typology Code available for payment source

## 2021-11-22 ENCOUNTER — Inpatient Hospital Stay (HOSPITAL_BASED_OUTPATIENT_CLINIC_OR_DEPARTMENT_OTHER): Payer: No Typology Code available for payment source | Admitting: Internal Medicine

## 2021-11-22 ENCOUNTER — Encounter: Payer: Self-pay | Admitting: Nutrition

## 2021-11-22 ENCOUNTER — Inpatient Hospital Stay: Payer: No Typology Code available for payment source | Attending: Internal Medicine

## 2021-11-22 ENCOUNTER — Other Ambulatory Visit: Payer: Self-pay

## 2021-11-22 VITALS — BP 141/88 | HR 79

## 2021-11-22 VITALS — BP 163/91 | HR 91 | Temp 97.1°F | Resp 16 | Wt 183.2 lb

## 2021-11-22 DIAGNOSIS — C3411 Malignant neoplasm of upper lobe, right bronchus or lung: Secondary | ICD-10-CM | POA: Insufficient documentation

## 2021-11-22 DIAGNOSIS — C7931 Secondary malignant neoplasm of brain: Secondary | ICD-10-CM | POA: Diagnosis not present

## 2021-11-22 DIAGNOSIS — C3412 Malignant neoplasm of upper lobe, left bronchus or lung: Secondary | ICD-10-CM | POA: Diagnosis present

## 2021-11-22 DIAGNOSIS — Z5112 Encounter for antineoplastic immunotherapy: Secondary | ICD-10-CM | POA: Diagnosis present

## 2021-11-22 DIAGNOSIS — Z79899 Other long term (current) drug therapy: Secondary | ICD-10-CM | POA: Insufficient documentation

## 2021-11-22 LAB — CMP (CANCER CENTER ONLY)
ALT: 10 U/L (ref 0–44)
AST: 12 U/L — ABNORMAL LOW (ref 15–41)
Albumin: 3.8 g/dL (ref 3.5–5.0)
Alkaline Phosphatase: 112 U/L (ref 38–126)
Anion gap: 8 (ref 5–15)
BUN: 23 mg/dL (ref 8–23)
CO2: 26 mmol/L (ref 22–32)
Calcium: 9.7 mg/dL (ref 8.9–10.3)
Chloride: 103 mmol/L (ref 98–111)
Creatinine: 1.87 mg/dL — ABNORMAL HIGH (ref 0.61–1.24)
GFR, Estimated: 39 mL/min — ABNORMAL LOW (ref >60)
Glucose, Bld: 121 mg/dL — ABNORMAL HIGH (ref 70–99)
Potassium: 4.1 mmol/L (ref 3.5–5.1)
Sodium: 137 mmol/L (ref 135–145)
Total Bilirubin: 0.4 mg/dL (ref 0.3–1.2)
Total Protein: 9 g/dL — ABNORMAL HIGH (ref 6.5–8.1)

## 2021-11-22 LAB — CBC WITH DIFFERENTIAL (CANCER CENTER ONLY)
Abs Immature Granulocytes: 0.02 10*3/uL (ref 0.00–0.07)
Basophils Absolute: 0.1 10*3/uL (ref 0.0–0.1)
Basophils Relative: 1 %
Eosinophils Absolute: 0.1 10*3/uL (ref 0.0–0.5)
Eosinophils Relative: 2 %
HCT: 40 % (ref 39.0–52.0)
Hemoglobin: 13.3 g/dL (ref 13.0–17.0)
Immature Granulocytes: 0 %
Lymphocytes Relative: 17 %
Lymphs Abs: 1.1 10*3/uL (ref 0.7–4.0)
MCH: 27.9 pg (ref 26.0–34.0)
MCHC: 33.3 g/dL (ref 30.0–36.0)
MCV: 83.9 fL (ref 80.0–100.0)
Monocytes Absolute: 0.6 10*3/uL (ref 0.1–1.0)
Monocytes Relative: 10 %
Neutro Abs: 4.3 10*3/uL (ref 1.7–7.7)
Neutrophils Relative %: 70 %
Platelet Count: 293 10*3/uL (ref 150–400)
RBC: 4.77 MIL/uL (ref 4.22–5.81)
RDW: 13.2 % (ref 11.5–15.5)
WBC Count: 6.2 10*3/uL (ref 4.0–10.5)
nRBC: 0 % (ref 0.0–0.2)

## 2021-11-22 LAB — TSH: TSH: 2.746 u[IU]/mL (ref 0.350–4.500)

## 2021-11-22 MED ORDER — SODIUM CHLORIDE 0.9 % IV SOLN: Freq: Once | INTRAVENOUS | Status: AC

## 2021-11-22 MED ORDER — SODIUM CHLORIDE 0.9 % IV SOLN
200.0000 mg | Freq: Once | INTRAVENOUS | Status: AC
  Administered 2021-11-22: 200 mg via INTRAVENOUS
  Filled 2021-11-22: qty 200

## 2021-11-22 NOTE — Progress Notes (Signed)
Per Dr. Julien Nordmann , it is ok to treat pt today with  ?Keytruda and creatinine of 1.87. ?

## 2021-11-22 NOTE — Progress Notes (Signed)
?    [facility] ?Telephone:([phone]) [phone]   [phone] ? ?OFFICE PROGRESS NOTE ? ?[name], MD ?[address] ?[place] [address] ? ?DIAGNOSIS: Stage IV non-small cell lung cancer, favored to be adenocarcinoma.  He presented with posterior right upper lobe nodule as well as right hilar, infrahilar, subcarinal, and right paratracheal lymphadenopathy.  He also has a 1.4 cm x 1 cm soft tissue nodule in the skin/subcutaneous fat in the left axilla.  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  He was diagnosed in August 2021 ?  ?Molecular Biomarkers: ?BIOMARKER(S)         % CFDNA OR AMPLIFICATION       ASSOCIATED FDA-APPROVED THERAPIES        CLINICAL TRIAL AVAILABILITY ?[id number] ?2.9% ?None    ?Yes ?[id number] ?0.4% ?None    ?Yes ?[id number] ?0.2% ?None    ?Yes ?  ?PRIOR THERAPY:  ?1) SRS to the small metastatic brain lesion under the care of Dr. [name] on 03/18/20 ?2) SBRT to the enlarging right upper lobe lung mass under the care of Dr. [name] completed on 04/06/2021. ?  ?CURRENT THERAPY:  ?1) Palliative systemic chemotherapy with carboplatin for an AUC of 5, Alimta 500 mg/m2, and Keytruda 200 mg IV every 3 weeks. First dose expected on 04/07/20.  Status post 28 cycles.  Starting from cycle #5 he will be on maintenance treatment with Alimta and Keytruda every 3 weeks.  For the last few cycles he is currently on maintenance treatment with single agent Keytruda. ?2) SBRT to the enlarging right upper lobe lung mass under the care of Dr. [name]. ? ?INTERVAL HISTORY: ?Randy Andersen 66 y.o. male returns to the clinic today for follow-up visit accompanied by his friend Gean Quint who is his emergency contact.  The patient continues to complain of cough productive of creamy sputum.  He denied having any chest pain, shortness of breath or hemoptysis.  He has no nausea, vomiting, diarrhea or constipation.  He denied having any headache or visual changes.  He has  lost few pounds since his last visit.  He denied having any fever or chills.  He has been tolerating his treatment with Keytruda fairly well.  The patient had repeat CT scan of the chest, abdomen pelvis performed recently and he is here for evaluation and discussion of his scan results. ?  ?MEDICAL HISTORY: ?Past Medical History:  ?Diagnosis Date  ? Asthma   ? as a child  ? Chronic pain disorder   ? LT leg and foot  ? Heart murmur   ? as a child  ? Hypertension   ? Malignant neoplasm of upper lobe of right lung ([facility])   ? Stroke [facility])   ? mini stroke - 2015, some tingling in fingers on right   ? ? ?ALLERGIES:  has No Known Allergies. ? ?MEDICATIONS:  ?Current Outpatient Medications  ?Medication Sig Dispense Refill  ? amLODipine (NORVASC) 10 MG tablet Take 10 mg by mouth daily.    ? Cholecalciferol 100 MCG (4000 UT) TABS Take 2 tablets by mouth daily.    ? [START ON 11/30/2021] dexamethasone (DECADRON) 4 MG tablet Take 1 tablet by mouth daily. 10 tablet 0  ? diclofenac Sodium (VOLTAREN) 1 % GEL Apply 4 g topically 3 (three) times daily.    ? doxylamine, Sleep, (UNISOM) 25 MG tablet Take 1 tablet (25 mg total) by mouth at bedtime as needed for sleep. 30 tablet 0  ?  HYDROcodone-acetaminophen (NORCO) 10-325 MG tablet Take 1 tablet by mouth 2 (two) times daily.    ? oxybutynin (DITROPAN-XL) 10 MG 24 hr tablet Take by mouth.    ? polyethylene glycol powder (GLYCOLAX/MIRALAX) 17 GM/SCOOP powder     ? potassium chloride SA (KLOR-CON M) 20 MEQ tablet Take 1 tablet (20 mEq total) by mouth daily. 5 tablet 0  ? prochlorperazine (COMPAZINE) 10 MG tablet Take 1 tablet (10 mg total) by mouth every 6 (six) hours as needed. 30 tablet 2  ? rivaroxaban (XARELTO) 20 MG TABS tablet TAKE 1 TABLET BY MOUTH ONCE A DAY WITH SUPPER 30 tablet 2  ? senna-docusate (SENOKOT-S) 8.6-50 MG tablet Take 2 tablets by mouth daily.    ? tadalafil (CIALIS) 20 MG tablet Take by mouth.    ? tamsulosin (FLOMAX) 0.4 MG CAPS capsule Take 1 capsule (0.4 mg  total) by mouth daily. 30 capsule 2  ? ?No current facility-administered medications for this visit.  ? ? ?SURGICAL HISTORY:  ?Past Surgical History:  ?Procedure Laterality Date  ? BUNIONECTOMY Left   ? had hammer toe surgery also and callus removed  ? BUNIONECTOMY Right   ? also had hammer toe surgery and callus removed  ? LEG SURGERY Left   ? PT reports he has pins placed in his Lt leg  ? ORIF TIBIA PLATEAU  10/09/2012  ? Dr Lorin Mercy  ? ORIF TIBIA PLATEAU Left 10/08/2012  ? Procedure: OPEN REDUCTION INTERNAL FIXATION (ORIF) TIBIAL PLATEAU;  Surgeon: Marybelle Killings, MD;  Location: [facility];  Service: Orthopedics;  Laterality: Left;  ? VIDEO BRONCHOSCOPY WITH ENDOBRONCHIAL NAVIGATION N/A 02/25/2020  ? Procedure: VIDEO BRONCHOSCOPY WITH ENDOBRONCHIAL NAVIGATION with biopsies;  Surgeon: Collene Gobble, MD;  Location: [facility] OR;  Service: Thoracic;  Laterality: N/A;  ? VIDEO BRONCHOSCOPY WITH ENDOBRONCHIAL ULTRASOUND N/A 02/25/2020  ? Procedure: VIDEO BRONCHOSCOPY WITH ENDOBRONCHIAL ULTRASOUND;  Surgeon: Collene Gobble, MD;  Location: [facility] OR;  Service: Thoracic;  Laterality: N/A;  ? ? ?REVIEW OF SYSTEMS:  Constitutional: positive for fatigue and weight loss ?Eyes: negative ?Ears, nose, mouth, throat, and face: negative ?Respiratory: positive for cough ?Cardiovascular: negative ?Gastrointestinal: negative ?Genitourinary:negative ?Integument/breast: negative ?Hematologic/lymphatic: negative ?Musculoskeletal:negative ?Neurological: negative ?Behavioral/Psych: negative ?Endocrine: negative ?Allergic/Immunologic: negative  ? ?PHYSICAL EXAMINATION: General appearance: alert, cooperative, and no distress ?Head: Normocephalic, without obvious abnormality, atraumatic ?Neck: no adenopathy, no JVD, supple, symmetrical, trachea midline, and thyroid not enlarged, symmetric, no tenderness/mass/nodules ?Lymph nodes: Cervical, supraclavicular, and axillary nodes normal. ?Resp: clear to auscultation bilaterally ?Back: symmetric, no curvature. ROM  normal. No CVA tenderness. ?Cardio: regular rate and rhythm, S1, S2 normal, no murmur, click, rub or gallop ?GI: soft, non-tender; bowel sounds normal; no masses,  no organomegaly ?Extremities: extremities normal, atraumatic, no cyanosis or edema ?Neurologic: Alert and oriented X 3, normal strength and tone. Normal symmetric reflexes. Normal coordination and gait ? ?ECOG PERFORMANCE STATUS: 1 - Symptomatic but completely ambulatory ? ?Blood pressure (!) 163/91, pulse 91, temperature (!) 97.1 ?F (36.2 ?C), temperature source Tympanic, resp. rate 16, weight 183 lb 4 oz (83.1 kg), SpO2 97 %. ? ?LABORATORY DATA: ?Lab Results  ?Component Value Date  ? WBC 6.2 11/22/2021  ? HGB 13.3 11/22/2021  ? HCT 40.0 11/22/2021  ? MCV 83.9 11/22/2021  ? PLT 293 11/22/2021  ? ? ?  Chemistry   ?   ?Component Value Date/Time  ? NA 138 10/31/2021 1412  ? K 3.2 (L) 10/31/2021 1412  ? CL 105 10/31/2021 1412  ? CO2 23  10/31/2021 1412  ? BUN 17 10/31/2021 1412  ? CREATININE 1.61 (H) 10/31/2021 1412  ?    ?Component Value Date/Time  ? CALCIUM 9.7 10/31/2021 1412  ? ALKPHOS 128 (H) 10/31/2021 1412  ? AST 12 (L) 10/31/2021 1412  ? ALT 13 10/31/2021 1412  ? BILITOT 0.5 10/31/2021 1412  ?  ? ? ? ?RADIOGRAPHIC STUDIES: ?CT Abdomen Pelvis Wo Contrast ? ?Result Date: 11/18/2021 ?CLINICAL DATA:  Metastatic non-small cell lung cancer restaging. * Tracking Code: BO * EXAM: CT CHEST, ABDOMEN AND PELVIS WITHOUT CONTRAST TECHNIQUE: Multidetector CT imaging of the chest, abdomen and pelvis was performed following the standard protocol without IV contrast. RADIATION DOSE REDUCTION: This exam was performed according to the departmental dose-optimization program which includes automated exposure control, adjustment of the mA and/or kV according to patient size and/or use of iterative reconstruction technique. COMPARISON:  Multiple exams, including 09/16/2021 FINDINGS: CT CHEST FINDINGS Cardiovascular: Coronary, aortic arch, and branch vessel atherosclerotic  vascular disease. Mediastinum/Nodes: No pathologic adenopathy in the chest. Lungs/Pleura: Occluded posterior segmental bronchus of the right upper lobe with progressive/worsening airspace opacity in the posterior s

## 2021-11-22 NOTE — Patient Instructions (Signed)
[facility]   ?Discharge Instructions: ?Thank you for choosing [facility] to provide your oncology and hematology care.  ? ?If you have a lab appointment with the [facility], please go directly to the [facility] and check in at the registration area. ?  ?Wear comfortable clothing and clothing appropriate for easy access to any Portacath or PICC line.  ? ?We strive to give you quality time with your provider. You may need to reschedule your appointment if you arrive late (15 or more minutes).  Arriving late affects you and other patients whose appointments are after yours.  Also, if you miss three or more appointments without notifying the office, you may be dismissed from the clinic at the provider?s discretion.    ?  ?For prescription refill requests, have your pharmacy contact our office and allow 72 hours for refills to be completed.   ? ?Today you received the following chemotherapy and/or immunotherapy agents: Pembrolizumab Beryle Flock)     ?  ?To help prevent nausea and vomiting after your treatment, we encourage you to take your nausea medication as directed. ? ?BELOW ARE SYMPTOMS THAT SHOULD BE REPORTED IMMEDIATELY: ?*FEVER GREATER THAN 100.4 F (38 ?C) OR HIGHER ?*CHILLS OR SWEATING ?*NAUSEA AND VOMITING THAT IS NOT CONTROLLED WITH YOUR NAUSEA MEDICATION ?*UNUSUAL SHORTNESS OF BREATH ?*UNUSUAL BRUISING OR BLEEDING ?*URINARY PROBLEMS (pain or burning when urinating, or frequent urination) ?*BOWEL PROBLEMS (unusual diarrhea, constipation, pain near the anus) ?TENDERNESS IN MOUTH AND THROAT WITH OR WITHOUT PRESENCE OF ULCERS (sore throat, sores in mouth, or a toothache) ?UNUSUAL RASH, SWELLING OR PAIN  ?UNUSUAL VAGINAL DISCHARGE OR ITCHING  ? ?Items with * indicate a potential emergency and should be followed up as soon as possible or go to the Emergency Department if any problems should occur. ? ?Please show the CHEMOTHERAPY ALERT CARD or IMMUNOTHERAPY ALERT  CARD at check-in to the Emergency Department and triage nurse. ? ?Should you have questions after your visit or need to cancel or reschedule your appointment, please contact [facility]  Dept: [phone]  and follow the prompts.  Office hours are 8:00 a.m. to 4:30 p.m. Monday - Friday. Please note that voicemails left after 4:00 p.m. may not be returned until the following business day.  We are closed weekends and major holidays. You have access to a nurse at all times for urgent questions. Please call the main number to the clinic Dept: [phone] and follow the prompts. ? ? ?For any non-urgent questions, you may also contact your provider using MyChart. We now offer e-Visits for anyone 82 and older to request care online for non-urgent symptoms. For details visit [email or web address]. ?  ?Also download the MyChart app! Go to the app store, search "MyChart", open the app, select [facility], and log in with your MyChart username and password. ? ?Due to Covid, a mask is required upon entering the hospital/clinic. If you do not have a mask, one will be given to you upon arrival. For doctor visits, patients may have 1 support person aged 66 or older with them. For treatment visits, patients cannot have anyone with them due to current Covid guidelines and our immunocompromised population.  ? ?

## 2021-11-22 NOTE — Progress Notes (Signed)
Provided 1 complementary case of Ensure Plus high-protein. ?

## 2021-11-23 ENCOUNTER — Other Ambulatory Visit: Payer: No Typology Code available for payment source

## 2021-11-23 ENCOUNTER — Ambulatory Visit: Payer: No Typology Code available for payment source | Admitting: Physician Assistant

## 2021-11-23 ENCOUNTER — Other Ambulatory Visit: Payer: Medicare PPO

## 2021-11-23 ENCOUNTER — Ambulatory Visit: Payer: No Typology Code available for payment source

## 2021-11-28 ENCOUNTER — Encounter: Payer: Self-pay | Admitting: Internal Medicine

## 2021-11-28 ENCOUNTER — Other Ambulatory Visit (HOSPITAL_COMMUNITY): Payer: Self-pay

## 2021-11-29 ENCOUNTER — Other Ambulatory Visit: Payer: Self-pay | Admitting: Radiation Therapy

## 2021-11-30 ENCOUNTER — Other Ambulatory Visit (HOSPITAL_COMMUNITY): Payer: Self-pay

## 2021-12-01 ENCOUNTER — Other Ambulatory Visit: Payer: Self-pay | Admitting: *Deleted

## 2021-12-01 MED ORDER — DEXAMETHASONE 4 MG PO TABS: 4.0000 mg | ORAL_TABLET | Freq: Every day | ORAL | 0 refills | Status: DC

## 2021-12-01 NOTE — Progress Notes (Signed)
Patient requested rx of Dexamethasone to change to [facility] ?

## 2021-12-07 NOTE — Progress Notes (Deleted)
Randy [facility] OFFICE PROGRESS NOTE  Randy Bears, MD [address] [place] [address]  DIAGNOSIS:  Stage IV non-small cell lung cancer, favored to be adenocarcinoma.  He presented with posterior right upper lobe nodule as well as right hilar, infrahilar, subcarinal, and right paratracheal lymphadenopathy.  He also has a 1.4 cm x 1 cm soft tissue nodule in the skin/subcutaneous fat in the left axilla.  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  He was diagnosed in August 2021   Molecular Biomarkers: BIOMARKER(S)         % CFDNA OR AMPLIFICATION       ASSOCIATED FDA-APPROVED THERAPIES        CLINICAL TRIAL AVAILABILITY [id number] 2.9% None    Yes [id number] 0.4% None    Yes [id number] 0.2% None    Yes  PRIOR THERAPY: 1) SRS to the small metastatic brain lesion under the care of Dr. [name] on 03/18/20 2) Radiation to the enlarging right upper lobe lung mass under the care of Dr. [name]. Last dose on 04/06/21.     CURRENT THERAPY:  Palliative systemic chemotherapy with carboplatin for an AUC of 5, Alimta 500 mg/m2, and Keytruda 200 mg IV every 3 weeks. First dose expected on 04/07/20. Status post 29 cycles. Starting from cycle #5 he will be on maintenance treatment with Alimta and Keytruda every 3 weeks. His dose of Alimta was reduced to 400 mg/m2. Alimta removed starting from cycle #11 due to renal insuffiencey.   INTERVAL HISTORY: Randy Andersen 66 y.o. male returns to the clinic today for a follow-up visit.  The patient is feeling well today without any concerning complaints.  He is currently undergoing single agent immunotherapy with Keytruda.  He is tolerating treatment well without any concerning adverse side effects.  Alimta was removed from his treatment plan due to renal insufficiency. The patient denies any recent fever, chills, or night sweats. He lost a few pounds in the interval. He states he has a good appetite but he is waiting  for a paycheck to go grocery shopping, he states he is stretching his groceries. He met with a  member of the nutritionist team at the last appointment. He was given complementary ensures. He denies any shortness of breath, chest pain, or hemoptysis.  He reports he has a intermittent mild cough which produces phlegm. He reports the cough is similar to his baseline. He does not take anything for cough.  We are monitoring his scans closely due to possible pneumonitis. He denies any nausea, vomiting, diarrhea, or constipation.  He denies any headache or visual changes.  He denies any rashes or skin changes.  He follows closely with neuro oncology regarding his history of metastatic disease to the brain. He recently had a repeat brain MRI and follow up with Dr. Mickeal Skinner. There is a concerning focus of progression within the previously treated left pre-central gyrus metastasis. Therefore, Dr. Mickeal Skinner recommended close interval follow up with a MRI of the brain in 1 month. This is scheduled for 12/13/21. He is here for evaluation and repeat blood work before starting cycle #30.   MEDICAL HISTORY: Past Medical History:  Diagnosis Date   Asthma    as a child   Chronic pain disorder    LT leg and foot   Heart murmur    as a child   Hypertension    Malignant neoplasm of upper lobe of right lung ([place])    Stroke (  [place])    mini stroke - 2015, some tingling in fingers on right     ALLERGIES:  has No Known Allergies.  MEDICATIONS:  Current Outpatient Medications  Medication Sig Dispense Refill   amLODipine (NORVASC) 10 MG tablet Take 10 mg by mouth daily.     Cholecalciferol 100 MCG (4000 UT) TABS Take 2 tablets by mouth daily.     dexamethasone (DECADRON) 4 MG tablet Take 1 tablet by mouth daily. 10 tablet 0   diclofenac Sodium (VOLTAREN) 1 % GEL Apply 4 g topically 3 (three) times daily.     doxylamine, Sleep, (UNISOM) 25 MG tablet Take 1 tablet (25 mg total) by mouth at bedtime as needed for sleep. 30  tablet 0   HYDROcodone-acetaminophen (NORCO) 10-325 MG tablet Take 1 tablet by mouth 2 (two) times daily.     oxybutynin (DITROPAN-XL) 10 MG 24 hr tablet Take by mouth.     polyethylene glycol powder (GLYCOLAX/MIRALAX) 17 GM/SCOOP powder      potassium chloride SA (KLOR-CON M) 20 MEQ tablet Take 1 tablet (20 mEq total) by mouth daily. 5 tablet 0   prochlorperazine (COMPAZINE) 10 MG tablet Take 1 tablet (10 mg total) by mouth every 6 (six) hours as needed. 30 tablet 2   rivaroxaban (XARELTO) 20 MG TABS tablet TAKE 1 TABLET BY MOUTH ONCE A DAY WITH SUPPER 30 tablet 2   senna-docusate (SENOKOT-S) 8.6-50 MG tablet Take 2 tablets by mouth daily.     tadalafil (CIALIS) 20 MG tablet Take by mouth.     tamsulosin (FLOMAX) 0.4 MG CAPS capsule Take 1 capsule (0.4 mg total) by mouth daily. 30 capsule 2   No current facility-administered medications for this visit.    SURGICAL HISTORY:  Past Surgical History:  Procedure Laterality Date   BUNIONECTOMY Left    had hammer toe surgery also and callus removed   BUNIONECTOMY Right    also had hammer toe surgery and callus removed   LEG SURGERY Left    PT reports he has pins placed in his Lt leg   ORIF TIBIA PLATEAU  10/09/2012   Dr Lorin Mercy   ORIF TIBIA PLATEAU Left 10/08/2012   Procedure: OPEN REDUCTION INTERNAL FIXATION (ORIF) TIBIAL PLATEAU;  Surgeon: Marybelle Killings, MD;  Location: [facility];  Service: Orthopedics;  Laterality: Left;   VIDEO BRONCHOSCOPY WITH ENDOBRONCHIAL NAVIGATION N/A 02/25/2020   Procedure: VIDEO BRONCHOSCOPY WITH ENDOBRONCHIAL NAVIGATION with biopsies;  Surgeon: Collene Gobble, MD;  Location: [facility];  Service: Thoracic;  Laterality: N/A;   VIDEO BRONCHOSCOPY WITH ENDOBRONCHIAL ULTRASOUND N/A 02/25/2020   Procedure: VIDEO BRONCHOSCOPY WITH ENDOBRONCHIAL ULTRASOUND;  Surgeon: Collene Gobble, MD;  Location: [facility];  Service: Thoracic;  Laterality: N/A;    REVIEW OF SYSTEMS:   Review of Systems  Constitutional: Negative for appetite change,  chills, fatigue, fever and unexpected weight change.  HENT:   Negative for mouth sores, nosebleeds, sore throat and trouble swallowing.   Eyes: Negative for eye problems and icterus.  Respiratory: Negative for cough, hemoptysis, shortness of breath and wheezing.   Cardiovascular: Negative for chest pain and leg swelling.  Gastrointestinal: Negative for abdominal pain, constipation, diarrhea, nausea and vomiting.  Genitourinary: Negative for bladder incontinence, difficulty urinating, dysuria, frequency and hematuria.   Musculoskeletal: Negative for back pain, gait problem, neck pain and neck stiffness.  Skin: Negative for itching and rash.  Neurological: Negative for dizziness, extremity weakness, gait problem, headaches, light-headedness and seizures.  Hematological: Negative for adenopathy. Does not bruise/bleed  easily.  Psychiatric/Behavioral: Negative for confusion, depression and sleep disturbance. The patient is not nervous/anxious.     PHYSICAL EXAMINATION:  There were no vitals taken for this visit.  ECOG PERFORMANCE STATUS: {CHL ONC ECOG [id number]  Physical Exam  Constitutional: Oriented to person, place, and time and well-developed, well-nourished, and in no distress. No distress.  HENT:  Head: Normocephalic and atraumatic.  Mouth/Throat: Oropharynx is clear and moist. No oropharyngeal exudate.  Eyes: Conjunctivae are normal. Right eye exhibits no discharge. Left eye exhibits no discharge. No scleral icterus.  Neck: Normal range of motion. Neck supple.  Cardiovascular: Normal rate, regular rhythm, normal heart sounds and intact distal pulses.   Pulmonary/Chest: Effort normal and breath sounds normal. No respiratory distress. No wheezes. No rales.  Abdominal: Soft. Bowel sounds are normal. Exhibits no distension and no mass. There is no tenderness.  Musculoskeletal: Normal range of motion. Exhibits no edema.  Lymphadenopathy:    No cervical adenopathy.  Neurological:  Alert and oriented to person, place, and time. Exhibits normal muscle tone. Gait normal. Coordination normal.  Skin: Skin is warm and dry. No rash noted. Not diaphoretic. No erythema. No pallor.  Psychiatric: Mood, memory and judgment normal.  Vitals reviewed.  LABORATORY DATA: Lab Results  Component Value Date   WBC 6.2 11/22/2021   HGB 13.3 11/22/2021   HCT 40.0 11/22/2021   MCV 83.9 11/22/2021   PLT 293 11/22/2021      Chemistry      Component Value Date/Time   NA 137 11/22/2021 0931   K 4.1 11/22/2021 0931   CL 103 11/22/2021 0931   CO2 26 11/22/2021 0931   BUN 23 11/22/2021 0931   CREATININE 1.87 (H) 11/22/2021 0931      Component Value Date/Time   CALCIUM 9.7 11/22/2021 0931   ALKPHOS 112 11/22/2021 0931   AST 12 (L) 11/22/2021 0931   ALT 10 11/22/2021 0931   BILITOT 0.4 11/22/2021 0931       RADIOGRAPHIC STUDIES:  CT Abdomen Pelvis Wo Contrast  Result Date: 11/18/2021 CLINICAL DATA:  Metastatic non-small cell lung cancer restaging. * Tracking Code: BO * EXAM: CT CHEST, ABDOMEN AND PELVIS WITHOUT CONTRAST TECHNIQUE: Multidetector CT imaging of the chest, abdomen and pelvis was performed following the standard protocol without IV contrast. RADIATION DOSE REDUCTION: This exam was performed according to the departmental dose-optimization program which includes automated exposure control, adjustment of the mA and/or kV according to patient size and/or use of iterative reconstruction technique. COMPARISON:  Multiple exams, including 09/16/2021 FINDINGS: CT CHEST FINDINGS Cardiovascular: Coronary, aortic arch, and branch vessel atherosclerotic vascular disease. Mediastinum/Nodes: No pathologic adenopathy in the chest. Lungs/Pleura: Occluded posterior segmental bronchus of the right upper lobe with progressive/worsening airspace opacity in the posterior segment right upper lobe, apical segment right upper lobe, and in the superior segment right lower lobe compared to 09/16/2021.  Increase consolidation and increased region of reticular interstitial and ground-glass opacities in the right upper lobe as best I can discern the patient's last radiation therapy to the right upper lobe was 04/06/2021. Stable localized subpleural reticulation inferiorly in the right middle lobe and inferiorly in the right lower lobe. 6 by 4 mm left upper lobe sub solid nodule on image 55 series 4, stable. Faint 9 by 8 mm sub solid nodule in the left upper lobe on image 96 of series 4, previously in this vicinity there is a 7 by 6 mm nodule with greater solidity. Anterior right upper lobe nodule 0.7 by 0.3 cm  on image 54 series 4, stable. Musculoskeletal: Mild thoracic spondylosis. Thirteen paired ribs as noted on prior exam. CT ABDOMEN PELVIS FINDINGS Hepatobiliary: Unremarkable Pancreas: Unremarkable Spleen: Unremarkable Adrenals/Urinary Tract: Stable probable cyst of the right kidney upper pole. Adrenal glands unremarkable. Stomach/Bowel: Prominent stool throughout the colon favors constipation. Vascular/Lymphatic: Atherosclerosis is present, including aortoiliac atherosclerotic disease. No pathologic adenopathy identified. Reproductive: Mild prostatomegaly with a slightly lobulated appearing prostate gland. Other: No supplemental non-categorized findings. Musculoskeletal: Bridging spurring of both sacroiliac joints. Facet arthropathy contributing to bilateral foraminal impingement at the lumbosacral junction. IMPRESSION: 1. Progressive airspace opacity in the right upper lobe and superior segment right lower lobe. The patient has had prior radiation therapy in the region back on 04/06/2021, and the somewhat inflammatory appearance tends to favor sequela of radiation therapy over malignancy although a component of recurrent malignancy within the consolidation is difficult to confidently exclude. As I noted previously, it is somewhat unusual to have progressive airspace opacities this far out from radiation  therapy. There is occlusion of the posterior segmental bronchus of the right upper lobe and the possibility of postobstructive pneumonia or pneumonitis is raised. Multidisciplinary cancer conference may be a reasonable venue for discussion of options with regard to further diagnosis and therapy. 2. Stable 7 by 3 mm nodule anteriorly in the right upper lobe. 3. At the site of the previous 7 by 6 mm left upper lobe nodule, there is currently only a faint 9 by 8 mm sub solid density in the lung. Surveillance suggested. 4. Other imaging findings of potential clinical significance: Aortic Atherosclerosis (ICD10-I70.0). Coronary and systemic atherosclerosis. Prominent stool throughout the colon favors constipation. Prostatomegaly. Bilateral foraminal impingement at L5-S1. Electronically Signed   By: Van Clines M.D.   On: 11/18/2021 09:44   CT Chest Wo Contrast  Result Date: 11/18/2021 CLINICAL DATA:  Metastatic non-small cell lung cancer restaging. * Tracking Code: BO * EXAM: CT CHEST, ABDOMEN AND PELVIS WITHOUT CONTRAST TECHNIQUE: Multidetector CT imaging of the chest, abdomen and pelvis was performed following the standard protocol without IV contrast. RADIATION DOSE REDUCTION: This exam was performed according to the departmental dose-optimization program which includes automated exposure control, adjustment of the mA and/or kV according to patient size and/or use of iterative reconstruction technique. COMPARISON:  Multiple exams, including 09/16/2021 FINDINGS: CT CHEST FINDINGS Cardiovascular: Coronary, aortic arch, and branch vessel atherosclerotic vascular disease. Mediastinum/Nodes: No pathologic adenopathy in the chest. Lungs/Pleura: Occluded posterior segmental bronchus of the right upper lobe with progressive/worsening airspace opacity in the posterior segment right upper lobe, apical segment right upper lobe, and in the superior segment right lower lobe compared to 09/16/2021. Increase consolidation  and increased region of reticular interstitial and ground-glass opacities in the right upper lobe as best I can discern the patient's last radiation therapy to the right upper lobe was 04/06/2021. Stable localized subpleural reticulation inferiorly in the right middle lobe and inferiorly in the right lower lobe. 6 by 4 mm left upper lobe sub solid nodule on image 55 series 4, stable. Faint 9 by 8 mm sub solid nodule in the left upper lobe on image 96 of series 4, previously in this vicinity there is a 7 by 6 mm nodule with greater solidity. Anterior right upper lobe nodule 0.7 by 0.3 cm on image 54 series 4, stable. Musculoskeletal: Mild thoracic spondylosis. Thirteen paired ribs as noted on prior exam. CT ABDOMEN PELVIS FINDINGS Hepatobiliary: Unremarkable Pancreas: Unremarkable Spleen: Unremarkable Adrenals/Urinary Tract: Stable probable cyst of the right kidney upper pole.  Adrenal glands unremarkable. Stomach/Bowel: Prominent stool throughout the colon favors constipation. Vascular/Lymphatic: Atherosclerosis is present, including aortoiliac atherosclerotic disease. No pathologic adenopathy identified. Reproductive: Mild prostatomegaly with a slightly lobulated appearing prostate gland. Other: No supplemental non-categorized findings. Musculoskeletal: Bridging spurring of both sacroiliac joints. Facet arthropathy contributing to bilateral foraminal impingement at the lumbosacral junction. IMPRESSION: 1. Progressive airspace opacity in the right upper lobe and superior segment right lower lobe. The patient has had prior radiation therapy in the region back on 04/06/2021, and the somewhat inflammatory appearance tends to favor sequela of radiation therapy over malignancy although a component of recurrent malignancy within the consolidation is difficult to confidently exclude. As I noted previously, it is somewhat unusual to have progressive airspace opacities this far out from radiation therapy. There is occlusion  of the posterior segmental bronchus of the right upper lobe and the possibility of postobstructive pneumonia or pneumonitis is raised. Multidisciplinary cancer conference may be a reasonable venue for discussion of options with regard to further diagnosis and therapy. 2. Stable 7 by 3 mm nodule anteriorly in the right upper lobe. 3. At the site of the previous 7 by 6 mm left upper lobe nodule, there is currently only a faint 9 by 8 mm sub solid density in the lung. Surveillance suggested. 4. Other imaging findings of potential clinical significance: Aortic Atherosclerosis (ICD10-I70.0). Coronary and systemic atherosclerosis. Prominent stool throughout the colon favors constipation. Prostatomegaly. Bilateral foraminal impingement at L5-S1. Electronically Signed   By: Van Clines M.D.   On: 11/18/2021 09:44     ASSESSMENT/PLAN:  This is a very pleasant 66 year old African-American male with stage IV non-small cell lung cancer, favored to be adenocarcinoma.  He presented with posterior right upper lobe nodule as well as right hilar, infrahilar, subcarinal, and right paratracheal lymphadenopathy.  He also has a 1.4 cm x 1 cm soft tissue nodule in the skin/subcutaneous fat in the left axilla.  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  He was diagnosed in August 2021.  Molecular studies by [facility] are negative for any actionable mutations.   The patient completed SRS to the small metastatic brain lesion under the care of Dr. [name] on 03/18/20   The patient is currently undergoing palliative systemic chemotherapy with carboplatin for an AUC of 5, Alimta 500 mg per metered squared, and Keytruda 200 mg IV every 3 weeks.  He is status post 29 cycles. The dose of alimta was reduced to 400 mg/m2 due to renal insufficiency. Alimta was ultimately removed from the treatment plan with cycle #11 due to renal insufficiency   The patient completed SBRT to the enlarging right upper lobe  lung mass under the care of Dr. [name] on 04/06/21.    Labs were reviewed.  Recommend that he proceed with cycle #30 today scheduled. He is ok to treat with his creatinine of *** today.     Be need to continue to monitor the increased airspace opacity in the right upper lobe surrounding the area of prior lesion. We will likely arrange for a restaging CT scan starting next cycle.   We will see the patient back for follow-up visit in 3 weeks for evaluation to review his scan results before starting cycle #31.   We will continue to follow closely with neuro oncology for his history of metastatic disease to the brain.  He is expected to have a repeat brain MRI next week on 12/13/21 .   The patient was  advised to call immediately if he has any concerning symptoms in the interval. The patient voices understanding of current disease status and treatment options and is in agreement with the current care plan. All questions were answered. The patient knows to call the clinic with any problems, questions or concerns. We can certainly see the patient much sooner if necessary     No orders of the defined types were placed in this encounter.    I spent {CHL ONC TIME VISIT - FBSJX:[id number]} counseling the patient face to face. The total time spent in the appointment was {CHL ONC TIME VISIT - JCSHR:[id number]}.  Ji Fairburn L Ma Munoz, PA-C 12/07/21

## 2021-12-09 ENCOUNTER — Telehealth: Payer: Self-pay | Admitting: Internal Medicine

## 2021-12-09 NOTE — Telephone Encounter (Signed)
Called patient regarding upcoming appointment, voicemail was left.

## 2021-12-12 ENCOUNTER — Inpatient Hospital Stay: Payer: No Typology Code available for payment source | Admitting: Physician Assistant

## 2021-12-12 ENCOUNTER — Ambulatory Visit: Payer: Medicare PPO | Admitting: Internal Medicine

## 2021-12-12 ENCOUNTER — Inpatient Hospital Stay: Payer: No Typology Code available for payment source

## 2021-12-12 ENCOUNTER — Ambulatory Visit: Payer: Medicare PPO

## 2021-12-12 ENCOUNTER — Telehealth: Payer: Self-pay | Admitting: Physician Assistant

## 2021-12-12 ENCOUNTER — Other Ambulatory Visit: Payer: Medicare PPO

## 2021-12-12 NOTE — Telephone Encounter (Signed)
.  Called pt per 5/22 inbasket , Patient was unavailable, a message with appt time and date was left with number on file.

## 2021-12-12 NOTE — Progress Notes (Unsigned)
[place] OFFICE PROGRESS NOTE  [name], MD [address] [place] [address]  DIAGNOSIS: DIAGNOSIS:  Stage IV non-small cell lung cancer, favored to be adenocarcinoma.  He presented with posterior right upper lobe nodule as well as right hilar, infrahilar, subcarinal, and right paratracheal lymphadenopathy.  He also has a 1.4 cm x 1 cm soft tissue nodule in the skin/subcutaneous fat in the left axilla.  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  He was diagnosed in August 2021   Molecular Biomarkers: BIOMARKER(S)         % CFDNA OR AMPLIFICATION       ASSOCIATED FDA-APPROVED THERAPIES        CLINICAL TRIAL AVAILABILITY [id number] 2.9% None    Yes [id number] 0.4% None    Yes [id number] 0.2% None    Yes  PRIOR THERAPY: 1) SRS to the small metastatic brain lesion under the care of Dr. [name] on 03/18/20 2) Radiation to the enlarging right upper lobe lung mass under the care of Dr. [name]. Last dose on 04/06/21.     CURRENT THERAPY:  Palliative systemic chemotherapy with carboplatin for an AUC of 5, Alimta 500 mg/m2, and Keytruda 200 mg IV every 3 weeks. First dose expected on 04/07/20. Status post 29 cycles. Starting from cycle #5 he will be on maintenance treatment with Alimta and Keytruda every 3 weeks. His dose of Alimta was reduced to 400 mg/m2. Alimta removed starting from cycle #11 due to renal insuffiencey.   INTERVAL HISTORY: Randy Andersen 66 y.o. male returns to the clinic today for a follow-up visit.  The patient is feeling well today without any concerning complaints.  He is currently undergoing single agent immunotherapy with Keytruda.  He is tolerating treatment well without any concerning adverse side effects.  Alimta was removed from his treatment plan due to renal insufficiency. The patient denies any recent fever, chills, or night sweats. His weight is stable. He was given complementary ensures today due to financial  constraints. He met with a  member of the nutritionist team at recent appointments. He denies any shortness of breath, chest pain, or hemoptysis.  He reports he has a intermittent mild cough which produces phlegm; however, he notes his cough has improved compared to prior. We are monitoring his scans closely due to possible pneumonitis. He denies any nausea, vomiting, diarrhea, or constipation.  He denies any headache or visual changes.  He denies any rashes or skin changes.  He follows closely with neuro oncology regarding his history of metastatic disease to the brain. He recently had a repeat brain MRI and follow up with Dr. Mickeal Skinner. There is a concerning focus of progression within the previously treated left pre-central gyrus metastasis. Therefore, Dr. Mickeal Skinner recommended close interval follow up with a MRI of the brain in 1 month. Was performed yesterday and he has an upcoming appointment with Dr. Mickeal Skinner. He is here for evaluation and repeat blood work before starting cycle #30.  MEDICAL HISTORY: Past Medical History:  Diagnosis Date   Asthma    as a child   Chronic pain disorder    LT leg and foot   Heart murmur    as a child   Hypertension    Malignant neoplasm of upper lobe of right lung ([place])    Stroke ([place])    mini stroke - 2015, some tingling in fingers on right     ALLERGIES:  has No Known Allergies.  MEDICATIONS:  Current Outpatient Medications  Medication Sig Dispense Refill   amLODipine (NORVASC) 10 MG tablet Take 10 mg by mouth daily.     Cholecalciferol 100 MCG (4000 UT) TABS Take 2 tablets by mouth daily.     dexamethasone (DECADRON) 4 MG tablet Take 1 tablet by mouth daily. 10 tablet 0   diclofenac Sodium (VOLTAREN) 1 % GEL Apply 4 g topically 3 (three) times daily.     doxylamine, Sleep, (UNISOM) 25 MG tablet Take 1 tablet (25 mg total) by mouth at bedtime as needed for sleep. 30 tablet 0   HYDROcodone-acetaminophen (NORCO) 10-325 MG tablet Take 1 tablet by mouth 2 (two)  times daily.     oxybutynin (DITROPAN-XL) 10 MG 24 hr tablet Take by mouth.     polyethylene glycol powder (GLYCOLAX/MIRALAX) 17 GM/SCOOP powder      potassium chloride SA (KLOR-CON M) 20 MEQ tablet Take 1 tablet (20 mEq total) by mouth daily. 5 tablet 0   prochlorperazine (COMPAZINE) 10 MG tablet Take 1 tablet (10 mg total) by mouth every 6 (six) hours as needed. 30 tablet 2   rivaroxaban (XARELTO) 20 MG TABS tablet TAKE 1 TABLET BY MOUTH ONCE A DAY WITH SUPPER 30 tablet 2   senna-docusate (SENOKOT-S) 8.6-50 MG tablet Take 2 tablets by mouth daily.     tadalafil (CIALIS) 20 MG tablet Take by mouth.     tamsulosin (FLOMAX) 0.4 MG CAPS capsule Take 1 capsule (0.4 mg total) by mouth daily. 30 capsule 2   No current facility-administered medications for this visit.    SURGICAL HISTORY:  Past Surgical History:  Procedure Laterality Date   BUNIONECTOMY Left    had hammer toe surgery also and callus removed   BUNIONECTOMY Right    also had hammer toe surgery and callus removed   LEG SURGERY Left    PT reports he has pins placed in his Lt leg   ORIF TIBIA PLATEAU  10/09/2012   Dr Lorin Mercy   ORIF TIBIA PLATEAU Left 10/08/2012   Procedure: OPEN REDUCTION INTERNAL FIXATION (ORIF) TIBIAL PLATEAU;  Surgeon: Marybelle Killings, MD;  Location: [facility];  Service: Orthopedics;  Laterality: Left;   VIDEO BRONCHOSCOPY WITH ENDOBRONCHIAL NAVIGATION N/A 02/25/2020   Procedure: VIDEO BRONCHOSCOPY WITH ENDOBRONCHIAL NAVIGATION with biopsies;  Surgeon: Collene Gobble, MD;  Location: [facility];  Service: Thoracic;  Laterality: N/A;   VIDEO BRONCHOSCOPY WITH ENDOBRONCHIAL ULTRASOUND N/A 02/25/2020   Procedure: VIDEO BRONCHOSCOPY WITH ENDOBRONCHIAL ULTRASOUND;  Surgeon: Collene Gobble, MD;  Location: [facility];  Service: Thoracic;  Laterality: N/A;    REVIEW OF SYSTEMS:   Review of Systems  Constitutional: Negative for appetite change, chills, fatigue, fever and unexpected weight change.  HENT:   Negative for mouth sores,  nosebleeds, sore throat and trouble swallowing.   Eyes: Negative for eye problems and icterus.  Respiratory: Positive for mild cough (improved compared to prior). Negative for hemoptysis, shortness of breath and wheezing.   Cardiovascular: Negative for chest pain and leg swelling.  Gastrointestinal: Negative for abdominal pain, constipation, diarrhea, nausea and vomiting.  Genitourinary: Negative for bladder incontinence, difficulty urinating, dysuria, frequency and hematuria.   Musculoskeletal: Negative for back pain, gait problem, neck pain and neck stiffness.  Skin: Negative for itching and rash.  Neurological: Negative for dizziness, extremity weakness, gait problem, headaches, light-headedness and seizures.  Hematological: Negative for adenopathy. Does not bruise/bleed easily.  Psychiatric/Behavioral: Negative for confusion, depression and sleep disturbance. The patient is not nervous/anxious.   PHYSICAL  EXAMINATION:  Blood pressure (!) 154/89, pulse 90, temperature (!) 97.1 F (36.2 C), temperature source Tympanic, resp. rate 17, weight 186 lb 8 oz (84.6 kg), SpO2 98 %.  ECOG PERFORMANCE STATUS: 1  Physical Exam  Constitutional: Oriented to person, place, and time and well-developed, well-nourished, and in no distress.  HENT:  Head: Normocephalic and atraumatic.  Mouth/Throat: Oropharynx is clear and moist. No oropharyngeal exudate.  Eyes: Conjunctivae are normal. Right eye exhibits no discharge. Left eye exhibits no discharge. No scleral icterus.  Neck: Normal range of motion. Neck supple.  Cardiovascular: Normal rate, regular rhythm, normal heart sounds and intact distal pulses.   Pulmonary/Chest: Effort normal and breath sounds normal. No respiratory distress. No wheezes. No rales.  Abdominal: Soft. Bowel sounds are normal. Exhibits no distension and no mass. There is no tenderness.  Musculoskeletal: Normal range of motion. Exhibits no edema.  Lymphadenopathy:    No cervical  adenopathy.  Neurological: Alert and oriented to person, place, and time. Exhibits normal muscle tone. Gait normal. Coordination normal.  Skin: Skin is warm and dry. No rash noted. Not diaphoretic. No erythema. No pallor.  Psychiatric: Mood, memory and judgment normal.  Vitals reviewed.  LABORATORY DATA: Lab Results  Component Value Date   WBC 10.9 (H) 12/14/2021   HGB 13.0 12/14/2021   HCT 38.3 (L) 12/14/2021   MCV 84.4 12/14/2021   PLT 208 12/14/2021      Chemistry      Component Value Date/Time   NA 136 12/14/2021 1312   K 4.1 12/14/2021 1312   CL 106 12/14/2021 1312   CO2 23 12/14/2021 1312   BUN 34 (H) 12/14/2021 1312   CREATININE 1.66 (H) 12/14/2021 1312      Component Value Date/Time   CALCIUM 9.2 12/14/2021 1312   ALKPHOS 87 12/14/2021 1312   AST 10 (L) 12/14/2021 1312   ALT 17 12/14/2021 1312   BILITOT 0.5 12/14/2021 1312       RADIOGRAPHIC STUDIES:  CT Abdomen Pelvis Wo Contrast  Result Date: 11/18/2021 CLINICAL DATA:  Metastatic non-small cell lung cancer restaging. * Tracking Code: BO * EXAM: CT CHEST, ABDOMEN AND PELVIS WITHOUT CONTRAST TECHNIQUE: Multidetector CT imaging of the chest, abdomen and pelvis was performed following the standard protocol without IV contrast. RADIATION DOSE REDUCTION: This exam was performed according to the departmental dose-optimization program which includes automated exposure control, adjustment of the mA and/or kV according to patient size and/or use of iterative reconstruction technique. COMPARISON:  Multiple exams, including 09/16/2021 FINDINGS: CT CHEST FINDINGS Cardiovascular: Coronary, aortic arch, and branch vessel atherosclerotic vascular disease. Mediastinum/Nodes: No pathologic adenopathy in the chest. Lungs/Pleura: Occluded posterior segmental bronchus of the right upper lobe with progressive/worsening airspace opacity in the posterior segment right upper lobe, apical segment right upper lobe, and in the superior segment  right lower lobe compared to 09/16/2021. Increase consolidation and increased region of reticular interstitial and ground-glass opacities in the right upper lobe as best I can discern the patient's last radiation therapy to the right upper lobe was 04/06/2021. Stable localized subpleural reticulation inferiorly in the right middle lobe and inferiorly in the right lower lobe. 6 by 4 mm left upper lobe sub solid nodule on image 55 series 4, stable. Faint 9 by 8 mm sub solid nodule in the left upper lobe on image 96 of series 4, previously in this vicinity there is a 7 by 6 mm nodule with greater solidity. Anterior right upper lobe nodule 0.7 by 0.3 cm on image  54 series 4, stable. Musculoskeletal: Mild thoracic spondylosis. Thirteen paired ribs as noted on prior exam. CT ABDOMEN PELVIS FINDINGS Hepatobiliary: Unremarkable Pancreas: Unremarkable Spleen: Unremarkable Adrenals/Urinary Tract: Stable probable cyst of the right kidney upper pole. Adrenal glands unremarkable. Stomach/Bowel: Prominent stool throughout the colon favors constipation. Vascular/Lymphatic: Atherosclerosis is present, including aortoiliac atherosclerotic disease. No pathologic adenopathy identified. Reproductive: Mild prostatomegaly with a slightly lobulated appearing prostate gland. Other: No supplemental non-categorized findings. Musculoskeletal: Bridging spurring of both sacroiliac joints. Facet arthropathy contributing to bilateral foraminal impingement at the lumbosacral junction. IMPRESSION: 1. Progressive airspace opacity in the right upper lobe and superior segment right lower lobe. The patient has had prior radiation therapy in the region back on 04/06/2021, and the somewhat inflammatory appearance tends to favor sequela of radiation therapy over malignancy although a component of recurrent malignancy within the consolidation is difficult to confidently exclude. As I noted previously, it is somewhat unusual to have progressive airspace  opacities this far out from radiation therapy. There is occlusion of the posterior segmental bronchus of the right upper lobe and the possibility of postobstructive pneumonia or pneumonitis is raised. Multidisciplinary cancer conference may be a reasonable venue for discussion of options with regard to further diagnosis and therapy. 2. Stable 7 by 3 mm nodule anteriorly in the right upper lobe. 3. At the site of the previous 7 by 6 mm left upper lobe nodule, there is currently only a faint 9 by 8 mm sub solid density in the lung. Surveillance suggested. 4. Other imaging findings of potential clinical significance: Aortic Atherosclerosis (ICD10-I70.0). Coronary and systemic atherosclerosis. Prominent stool throughout the colon favors constipation. Prostatomegaly. Bilateral foraminal impingement at L5-S1. Electronically Signed   By: Van Clines M.D.   On: 11/18/2021 09:44   CT Chest Wo Contrast  Result Date: 11/18/2021 CLINICAL DATA:  Metastatic non-small cell lung cancer restaging. * Tracking Code: BO * EXAM: CT CHEST, ABDOMEN AND PELVIS WITHOUT CONTRAST TECHNIQUE: Multidetector CT imaging of the chest, abdomen and pelvis was performed following the standard protocol without IV contrast. RADIATION DOSE REDUCTION: This exam was performed according to the departmental dose-optimization program which includes automated exposure control, adjustment of the mA and/or kV according to patient size and/or use of iterative reconstruction technique. COMPARISON:  Multiple exams, including 09/16/2021 FINDINGS: CT CHEST FINDINGS Cardiovascular: Coronary, aortic arch, and branch vessel atherosclerotic vascular disease. Mediastinum/Nodes: No pathologic adenopathy in the chest. Lungs/Pleura: Occluded posterior segmental bronchus of the right upper lobe with progressive/worsening airspace opacity in the posterior segment right upper lobe, apical segment right upper lobe, and in the superior segment right lower lobe compared  to 09/16/2021. Increase consolidation and increased region of reticular interstitial and ground-glass opacities in the right upper lobe as best I can discern the patient's last radiation therapy to the right upper lobe was 04/06/2021. Stable localized subpleural reticulation inferiorly in the right middle lobe and inferiorly in the right lower lobe. 6 by 4 mm left upper lobe sub solid nodule on image 55 series 4, stable. Faint 9 by 8 mm sub solid nodule in the left upper lobe on image 96 of series 4, previously in this vicinity there is a 7 by 6 mm nodule with greater solidity. Anterior right upper lobe nodule 0.7 by 0.3 cm on image 54 series 4, stable. Musculoskeletal: Mild thoracic spondylosis. Thirteen paired ribs as noted on prior exam. CT ABDOMEN PELVIS FINDINGS Hepatobiliary: Unremarkable Pancreas: Unremarkable Spleen: Unremarkable Adrenals/Urinary Tract: Stable probable cyst of the right kidney upper pole. Adrenal glands  unremarkable. Stomach/Bowel: Prominent stool throughout the colon favors constipation. Vascular/Lymphatic: Atherosclerosis is present, including aortoiliac atherosclerotic disease. No pathologic adenopathy identified. Reproductive: Mild prostatomegaly with a slightly lobulated appearing prostate gland. Other: No supplemental non-categorized findings. Musculoskeletal: Bridging spurring of both sacroiliac joints. Facet arthropathy contributing to bilateral foraminal impingement at the lumbosacral junction. IMPRESSION: 1. Progressive airspace opacity in the right upper lobe and superior segment right lower lobe. The patient has had prior radiation therapy in the region back on 04/06/2021, and the somewhat inflammatory appearance tends to favor sequela of radiation therapy over malignancy although a component of recurrent malignancy within the consolidation is difficult to confidently exclude. As I noted previously, it is somewhat unusual to have progressive airspace opacities this far out from  radiation therapy. There is occlusion of the posterior segmental bronchus of the right upper lobe and the possibility of postobstructive pneumonia or pneumonitis is raised. Multidisciplinary cancer conference may be a reasonable venue for discussion of options with regard to further diagnosis and therapy. 2. Stable 7 by 3 mm nodule anteriorly in the right upper lobe. 3. At the site of the previous 7 by 6 mm left upper lobe nodule, there is currently only a faint 9 by 8 mm sub solid density in the lung. Surveillance suggested. 4. Other imaging findings of potential clinical significance: Aortic Atherosclerosis (ICD10-I70.0). Coronary and systemic atherosclerosis. Prominent stool throughout the colon favors constipation. Prostatomegaly. Bilateral foraminal impingement at L5-S1. Electronically Signed   By: Van Clines M.D.   On: 11/18/2021 09:44   MR BRAIN W WO CONTRAST  Result Date: 12/14/2021 CLINICAL DATA:  Brain/CNS neoplasm, assess treatment response; metastatic lung cancer EXAM: MRI HEAD WITHOUT AND WITH CONTRAST TECHNIQUE: Multiplanar, multiecho pulse sequences of the brain and surrounding structures were obtained without and with intravenous contrast. CONTRAST:  56mL MULTIHANCE GADOBENATE DIMEGLUMINE 529 MG/ML IV SOLN COMPARISON:  11/03/2021 FINDINGS: Brain: Medial left precentral gyrus lesion has again increased in size now measuring 1.1 x 0.6 cm (previously 0.8 x 0.7 cm). Surrounding edema has decreased. New punctate focus of cortical enhancement in the left parietal lobe (series 11, image 105). No acute infarction. Ventricles and sulci are stable in size and configuration. Chronic infarcts of bilateral basal ganglia and adjacent white matter with associated chronic blood products. Vascular: Major vessel flow voids at the skull base are preserved. Skull and upper cervical spine: Normal marrow signal is preserved. Sinuses/Orbits: Paranasal sinuses are aerated. Orbits are unremarkable. Other: Sella  is unremarkable.  Mastoid air cells are clear. IMPRESSION: Further increase in size of left precentral gyrus lesion. However, surrounding edema has decreased. New punctate focus of left parietal cortical enhancement. Recommend attention on follow-up. Electronically Signed   By: Macy Mis M.D.   On: 12/14/2021 11:16     ASSESSMENT/PLAN:  This is a very pleasant 66 year old African-American male with stage IV non-small cell lung cancer, favored to be adenocarcinoma.  He presented with posterior right upper lobe nodule as well as right hilar, infrahilar, subcarinal, and right paratracheal lymphadenopathy.  He also has a 1.4 cm x 1 cm soft tissue nodule in the skin/subcutaneous fat in the left axilla.  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  He was diagnosed in August 2021.  Molecular studies by [facility] 360 are negative for any actionable mutations.   The patient completed SRS to the small metastatic brain lesion under the care of Dr. [name] on 03/18/20   The patient is currently undergoing palliative systemic chemotherapy  with carboplatin for an AUC of 5, Alimta 500 mg per metered squared, and Keytruda 200 mg IV every 3 weeks.  He is status post 29 cycles. The dose of alimta was reduced to 400 mg/m2 due to renal insufficiency. Alimta was ultimately removed from the treatment plan with cycle #11 due to renal insufficiency   The patient completed SBRT to the enlarging right upper lobe lung mass under the care of Dr. [name] on 04/06/21.    Labs were reviewed.  Recommend that he proceed with cycle #30 today scheduled. He is ok to treat with his creatinine of 1.66 today.     Be need to continue to monitor the increased airspace opacity in the right upper lobe surrounding the area of prior lesion. We will likely arrange for a restaging CT scan starting next cycle.   We will see the patient back for follow-up visit in 3 weeks for evaluation to review his scan results before  starting cycle #31.  We will continue to follow closely with neuro oncology for his history of metastatic disease to the brain.  He is expected to follow up with Dr. Mickeal Skinner in the near future.   He was given complementary ensures today.   The patient was advised to call immediately if he has any concerning symptoms in the interval. The patient voices understanding of current disease status and treatment options and is in agreement with the current care plan. All questions were answered. The patient knows to call the clinic with any problems, questions or concerns. We can certainly see the patient much sooner if necessary   No orders of the defined types were placed in this encounter.     The total time spent in the appointment was 20-29 minutes.   Derwood Becraft L Rex Magee, PA-C 12/14/21

## 2021-12-12 NOTE — Telephone Encounter (Signed)
The patient did not show up to his appointment today which is not typical for him.  I do know he does have some transportation constraints.  I called the patient to ensure that he is okay.  I was unable to reach him but I left a voicemail encouraging him to call us back to reschedule his missed appointment.  Scheduling message sent.

## 2021-12-13 ENCOUNTER — Ambulatory Visit
Admission: RE | Admit: 2021-12-13 | Discharge: 2021-12-13 | Disposition: A | Discharge status: Home / Self Care | Payer: Medicare PPO | Source: Ambulatory Visit | Attending: Internal Medicine | Admitting: Internal Medicine

## 2021-12-13 DIAGNOSIS — C719 Malignant neoplasm of brain, unspecified: Secondary | ICD-10-CM | POA: Diagnosis not present

## 2021-12-13 DIAGNOSIS — I639 Cerebral infarction, unspecified: Secondary | ICD-10-CM | POA: Diagnosis not present

## 2021-12-13 DIAGNOSIS — G936 Cerebral edema: Secondary | ICD-10-CM | POA: Diagnosis not present

## 2021-12-13 DIAGNOSIS — C7931 Secondary malignant neoplasm of brain: Secondary | ICD-10-CM

## 2021-12-13 DIAGNOSIS — C3490 Malignant neoplasm of unspecified part of unspecified bronchus or lung: Secondary | ICD-10-CM | POA: Diagnosis not present

## 2021-12-13 IMAGING — MR MR HEAD WO/W CM
12 series · 48 of 48 positions shown · IV contrast (multihance)
Comparison: [DATE]

CLINICAL DATA: Brain/CNS neoplasm, assess treatment response;
metastatic lung cancer

EXAM:
MRI HEAD WITHOUT AND WITH CONTRAST
TECHNIQUE: Multiplanar, multiecho pulse sequences of the brain and surrounding
structures were obtained without and with intravenous contrast.
CONTRAST:  17mL MULTIHANCE GADOBENATE DIMEGLUMINE 529 MG/ML IV SOLN

[Series 2: FLAIR · sagittal · 3.0mm · 0.78mm/px · 3 of 39 slices shown (1 of 2)]
[im 1/39]
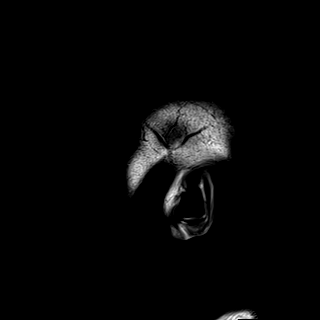
[im 20/39]
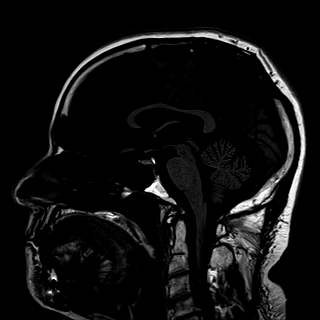
[im 39/39]
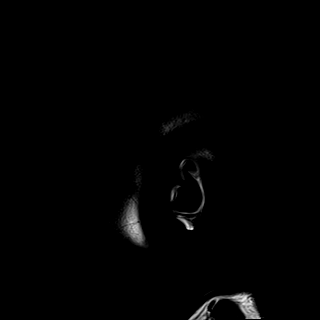

[Series 3: DWI · axial · 3.0mm · 1.56mm/px · z∈[-71,+77]mm · 4 of 78 slices shown (1 of 2)]
[im 1/78]
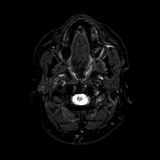
[im 26/78]
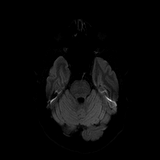
[im 52/78]
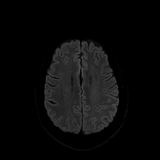
[im 78/78]
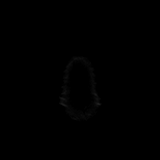

[Series 4: DWI · axial · 3.0mm · 1.56mm/px · z∈[-71,+77]mm · 2 of 39 slices shown (2 of 2)]
[im 1/39]
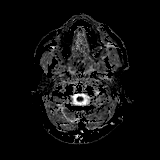
[im 39/39]
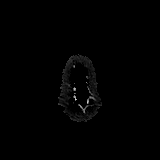

[Series 5: T2 · axial · 5.0mm · 0.65mm/px · 1 of 27 slices shown (1 of 2)]
[im 1/27]
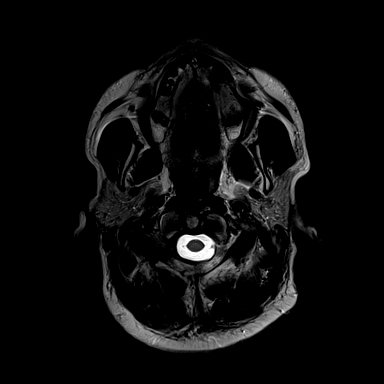

[Series 7: swi_images · axial · 1.5mm · 0.98mm/px · z∈[-65,+89]mm · 5 of 104 slices shown]
[im 1/104]
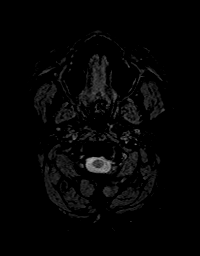
[im 26/104]
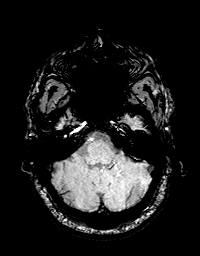
[im 52/104]
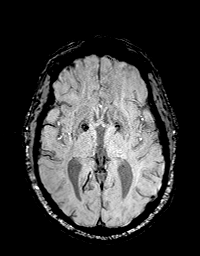
[im 78/104]
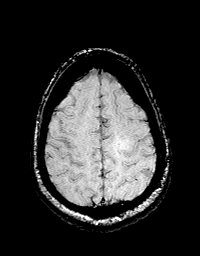
[im 104/104]
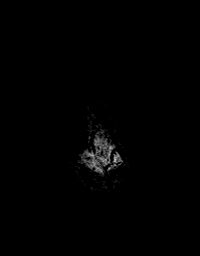

[Series 8: FLAIR · axial · 3.0mm · 0.98mm/px · z∈[-74,+88]mm · 3 of 55 slices shown (2 of 2)]
[im 1/55]
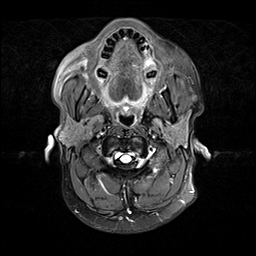
[im 28/55]
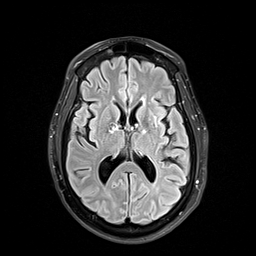
[im 55/55]
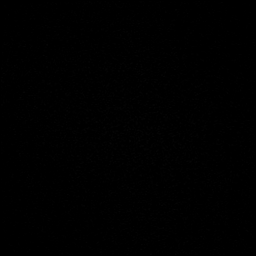

[Series 9: T2 · axial · non-contrast · 1.0mm · 0.98mm/px · z∈[-71,+85]mm · 8 of 160 slices shown (2 of 2)]
[im 1/160]
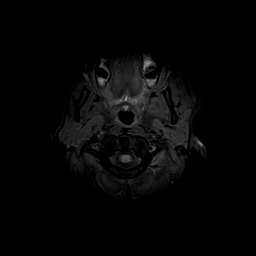
[im 23/160]
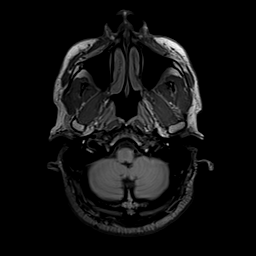
[im 46/160]
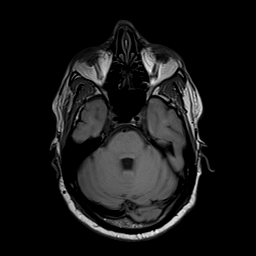
[im 69/160]
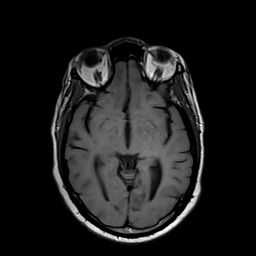
[im 91/160]
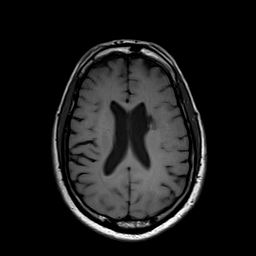
[im 114/160]
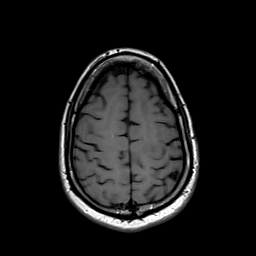
[im 137/160]
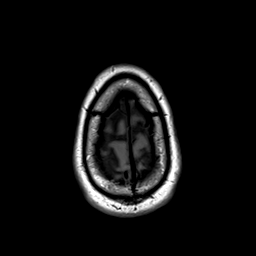
[im 160/160]
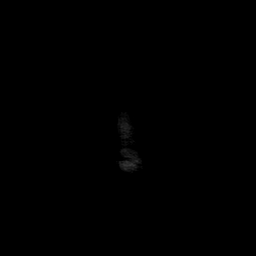

[Series 10: T2 post-contrast · coronal · 3.0mm · 0.57mm/px · 2 of 46 slices shown (1 of 2)]
[im 1/46]
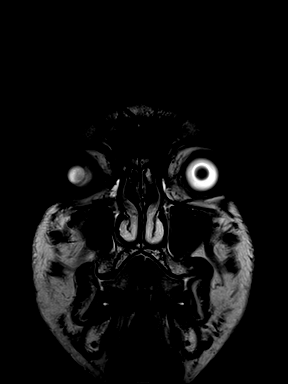
[im 46/46]
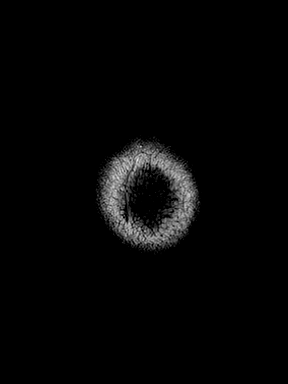

[Series 11: T2 post-contrast · axial · 1.0mm · 0.98mm/px · z∈[-71,+85]mm · 8 of 160 slices shown (2 of 2)]
[im 1/160]
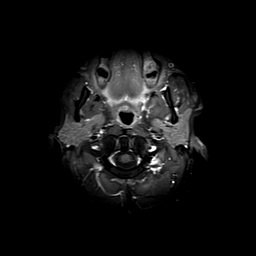
[im 23/160]
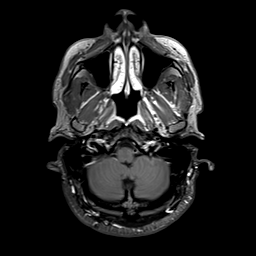
[im 46/160]
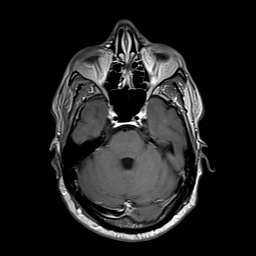
[im 69/160]
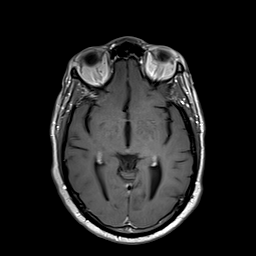
[im 91/160]
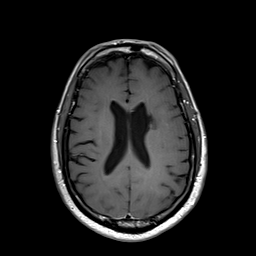
[im 114/160]
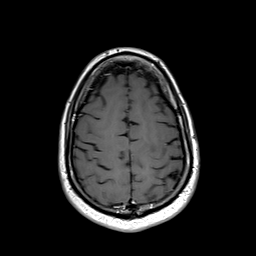
[im 137/160]
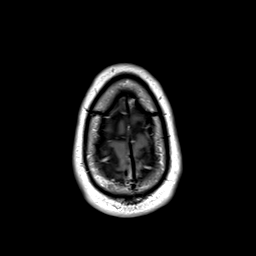
[im 160/160]
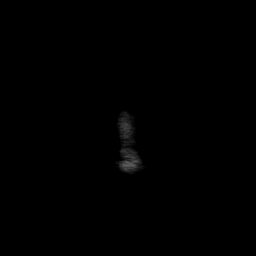

[Series 12: T1 post-contrast · axial · 1.0mm · 0.78mm/px · z∈[-71,+88]mm · 8 of 160 slices shown (1 of 2)]
[im 1/160]
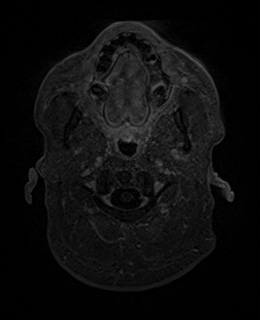
[im 23/160]
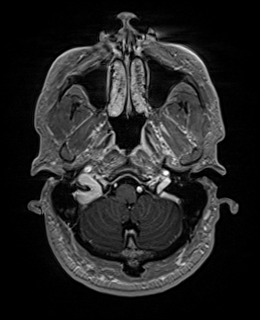
[im 46/160]
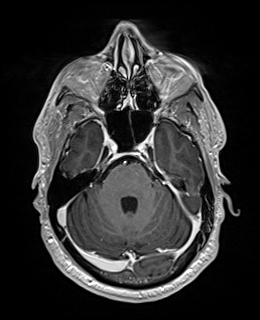
[im 69/160]
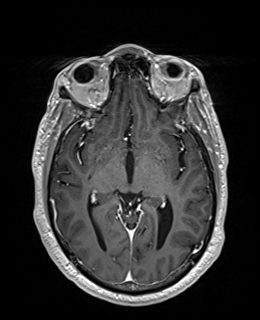
[im 91/160]
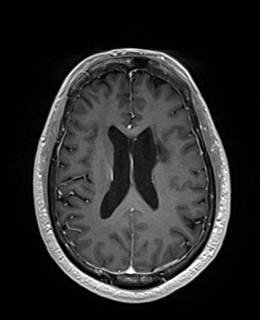
[im 114/160]
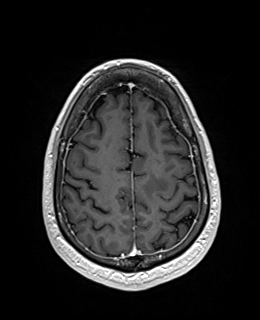
[im 137/160]
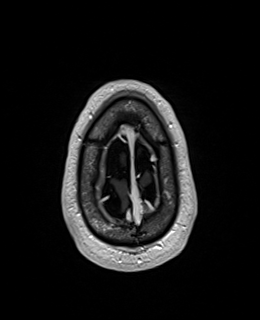
[im 160/160]
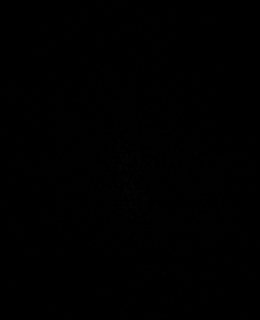

[Series 13: T1 post-contrast · coronal · 3.0mm · 0.57mm/px · 2 of 47 slices shown (2 of 2)]
[im 1/47]
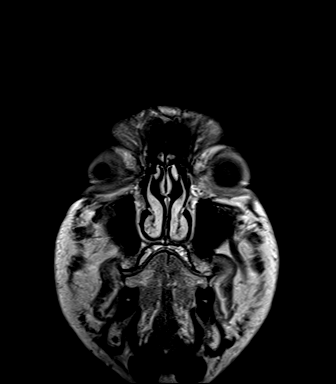
[im 47/47]
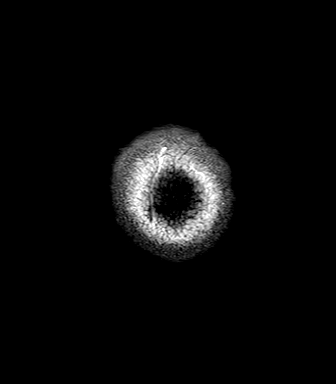

[Series 14: FLAIR post-contrast · sagittal · 3.0mm · 0.78mm/px · 2 of 39 slices shown]
[im 1/39]
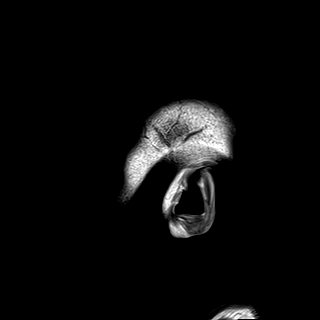
[im 39/39]
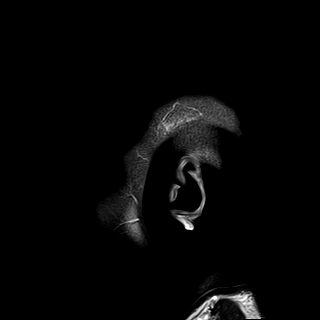

[48 of 48 positions shown; findings below may reference images not displayed]

FINDINGS: Brain: Medial left precentral gyrus lesion has again increased in
size now measuring 1.1 x 0.6 cm (previously 0.8 x 0.7 cm).
Surrounding edema has decreased.

New punctate focus of cortical enhancement in the left parietal lobe
(series 11, image 105).

No acute infarction. Ventricles and sulci are stable in size and
configuration. Chronic infarcts of bilateral basal ganglia and
adjacent white matter with associated chronic blood products.

Vascular: Major vessel flow voids at the skull base are preserved.

Skull and upper cervical spine: Normal marrow signal is preserved.

Sinuses/Orbits: Paranasal sinuses are aerated. Orbits are
unremarkable.

Other: Sella is unremarkable.  Mastoid air cells are clear.
IMPRESSION: Further increase in size of left precentral gyrus lesion. However,
surrounding edema has decreased.

New punctate focus of left parietal cortical enhancement. Recommend
attention on follow-up.

## 2021-12-13 MED ORDER — GADOBENATE DIMEGLUMINE 529 MG/ML IV SOLN
17.0000 mL | Freq: Once | INTRAVENOUS | Status: AC | PRN
  Administered 2021-12-13: 17 mL via INTRAVENOUS

## 2021-12-14 ENCOUNTER — Inpatient Hospital Stay (HOSPITAL_BASED_OUTPATIENT_CLINIC_OR_DEPARTMENT_OTHER): Payer: No Typology Code available for payment source | Admitting: Physician Assistant

## 2021-12-14 ENCOUNTER — Other Ambulatory Visit: Payer: Self-pay

## 2021-12-14 ENCOUNTER — Inpatient Hospital Stay: Payer: No Typology Code available for payment source

## 2021-12-14 VITALS — BP 154/89 | HR 90 | Temp 97.1°F | Resp 17 | Wt 186.5 lb

## 2021-12-14 DIAGNOSIS — C3411 Malignant neoplasm of upper lobe, right bronchus or lung: Secondary | ICD-10-CM

## 2021-12-14 DIAGNOSIS — Z5112 Encounter for antineoplastic immunotherapy: Secondary | ICD-10-CM | POA: Diagnosis not present

## 2021-12-14 LAB — CBC WITH DIFFERENTIAL (CANCER CENTER ONLY)
Abs Immature Granulocytes: 0.08 10*3/uL — ABNORMAL HIGH (ref 0.00–0.07)
Basophils Absolute: 0 10*3/uL (ref 0.0–0.1)
Basophils Relative: 0 %
Eosinophils Absolute: 0 10*3/uL (ref 0.0–0.5)
Eosinophils Relative: 0 %
HCT: 38.3 % — ABNORMAL LOW (ref 39.0–52.0)
Hemoglobin: 13 g/dL (ref 13.0–17.0)
Immature Granulocytes: 1 %
Lymphocytes Relative: 12 %
Lymphs Abs: 1.3 10*3/uL (ref 0.7–4.0)
MCH: 28.6 pg (ref 26.0–34.0)
MCHC: 33.9 g/dL (ref 30.0–36.0)
MCV: 84.4 fL (ref 80.0–100.0)
Monocytes Absolute: 0.7 10*3/uL (ref 0.1–1.0)
Monocytes Relative: 6 %
Neutro Abs: 8.8 10*3/uL — ABNORMAL HIGH (ref 1.7–7.7)
Neutrophils Relative %: 81 %
Platelet Count: 208 10*3/uL (ref 150–400)
RBC: 4.54 MIL/uL (ref 4.22–5.81)
RDW: 16.5 % — ABNORMAL HIGH (ref 11.5–15.5)
WBC Count: 10.9 10*3/uL — ABNORMAL HIGH (ref 4.0–10.5)
nRBC: 0 % (ref 0.0–0.2)

## 2021-12-14 LAB — CMP (CANCER CENTER ONLY)
ALT: 17 U/L (ref 0–44)
AST: 10 U/L — ABNORMAL LOW (ref 15–41)
Albumin: 3.6 g/dL (ref 3.5–5.0)
Alkaline Phosphatase: 87 U/L (ref 38–126)
Anion gap: 7 (ref 5–15)
BUN: 34 mg/dL — ABNORMAL HIGH (ref 8–23)
CO2: 23 mmol/L (ref 22–32)
Calcium: 9.2 mg/dL (ref 8.9–10.3)
Chloride: 106 mmol/L (ref 98–111)
Creatinine: 1.66 mg/dL — ABNORMAL HIGH (ref 0.61–1.24)
GFR, Estimated: 45 mL/min — ABNORMAL LOW (ref >60)
Glucose, Bld: 141 mg/dL — ABNORMAL HIGH (ref 70–99)
Potassium: 4.1 mmol/L (ref 3.5–5.1)
Sodium: 136 mmol/L (ref 135–145)
Total Bilirubin: 0.5 mg/dL (ref 0.3–1.2)
Total Protein: 7.3 g/dL (ref 6.5–8.1)

## 2021-12-14 LAB — TSH: TSH: 1.208 u[IU]/mL (ref 0.350–4.500)

## 2021-12-14 MED ORDER — SODIUM CHLORIDE 0.9 % IV SOLN: Freq: Once | INTRAVENOUS | Status: AC

## 2021-12-14 MED ORDER — SODIUM CHLORIDE 0.9 % IV SOLN
200.0000 mg | Freq: Once | INTRAVENOUS | Status: AC
  Administered 2021-12-14: 200 mg via INTRAVENOUS
  Filled 2021-12-14: qty 200

## 2021-12-14 NOTE — Patient Instructions (Signed)
[facility]  Discharge Instructions: Thank you for choosing [facility] to provide your oncology and hematology care.   If you have a lab appointment with the [facility], please go directly to the [facility] and check in at the registration area.   Wear comfortable clothing and clothing appropriate for easy access to any Portacath or PICC line.   We strive to give you quality time with your provider. You may need to reschedule your appointment if you arrive late (15 or more minutes).  Arriving late affects you and other patients whose appointments are after yours.  Also, if you miss three or more appointments without notifying the office, you may be dismissed from the clinic at the provider's discretion.      For prescription refill requests, have your pharmacy contact our office and allow 72 hours for refills to be completed.    Today you received the following chemotherapy and/or immunotherapy agents Randy Andersen      To help prevent nausea and vomiting after your treatment, we encourage you to take your nausea medication as directed.  BELOW ARE SYMPTOMS THAT SHOULD BE REPORTED IMMEDIATELY: *FEVER GREATER THAN 100.4 F (38 C) OR HIGHER *CHILLS OR SWEATING *NAUSEA AND VOMITING THAT IS NOT CONTROLLED WITH YOUR NAUSEA MEDICATION *UNUSUAL SHORTNESS OF BREATH *UNUSUAL BRUISING OR BLEEDING *URINARY PROBLEMS (pain or burning when urinating, or frequent urination) *BOWEL PROBLEMS (unusual diarrhea, constipation, pain near the anus) TENDERNESS IN MOUTH AND THROAT WITH OR WITHOUT PRESENCE OF ULCERS (sore throat, sores in mouth, or a toothache) UNUSUAL RASH, SWELLING OR PAIN  UNUSUAL VAGINAL DISCHARGE OR ITCHING   Items with * indicate a potential emergency and should be followed up as soon as possible or go to the Emergency Department if any problems should occur.  Please show the CHEMOTHERAPY ALERT CARD or IMMUNOTHERAPY ALERT CARD at check-in to  the Emergency Department and triage nurse.  Should you have questions after your visit or need to cancel or reschedule your appointment, please contact [facility]  Dept: [phone]  and follow the prompts.  Office hours are 8:00 a.m. to 4:30 p.m. Monday - Friday. Please note that voicemails left after 4:00 p.m. may not be returned until the following business day.  We are closed weekends and major holidays. You have access to a nurse at all times for urgent questions. Please call the main number to the clinic Dept: [phone] and follow the prompts.   For any non-urgent questions, you may also contact your provider using MyChart. We now offer e-Visits for anyone 13 and older to request care online for non-urgent symptoms. For details visit [email or web address].   Also download the MyChart app! Go to the app store, search "MyChart", open the app, select [facility], and log in with your MyChart username and password.  Due to Covid, a mask is required upon entering the hospital/clinic. If you do not have a mask, one will be given to you upon arrival. For doctor visits, patients may have 1 support person aged 25 or older with them. For treatment visits, patients cannot have anyone with them due to current Covid guidelines and our immunocompromised population.

## 2021-12-15 ENCOUNTER — Encounter: Payer: Self-pay | Admitting: Dietician

## 2021-12-15 NOTE — Progress Notes (Signed)
Patient provided one complimentary case of Ensure Plus High Protein and coupons on 5/24

## 2021-12-24 NOTE — Progress Notes (Deleted)
[facility] OFFICE PROGRESS NOTE  [name], MD [address]  [place] [address]  DIAGNOSIS: Stage IV non-small cell lung cancer, favored to be adenocarcinoma.  He presented with posterior right upper lobe nodule as well as right hilar, infrahilar, subcarinal, and right paratracheal lymphadenopathy.  He also has a 1.4 cm x 1 cm soft tissue nodule in the skin/subcutaneous fat in the left axilla.  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  He was diagnosed in August 2021   Molecular Biomarkers: BIOMARKER(S)         % CFDNA OR AMPLIFICATION       ASSOCIATED FDA-APPROVED THERAPIES        CLINICAL TRIAL AVAILABILITY [id number] 2.9% None    Yes [id number] 0.4% None    Yes [id number] 0.2% None    Yes    PRIOR THERAPY: 1) SRS to the small metastatic brain lesion under the care of Dr. [name] on 03/18/20 2) Radiation to the enlarging right upper lobe lung mass under the care of Dr. [name]. Last dose on 04/06/21.   CURRENT THERAPY: Palliative systemic chemotherapy with carboplatin for an AUC of 5, Alimta 500 mg/m2, and Keytruda 200 mg IV every 3 weeks. First dose expected on 04/07/20. Status post 30 cycles. Starting from cycle #5 he will be on maintenance treatment with Alimta and Keytruda every 3 weeks. His dose of Alimta was reduced to 400 mg/m2. Alimta removed starting from cycle #11 due to renal insuffiencey.   INTERVAL HISTORY: Randy Andersen 66 y.o. male returns to the clinic today for a follow-up visit. The patient is feeling well today without any concerning complaints.  He is currently undergoing single agent immunotherapy with Keytruda.  He is tolerating treatment well without any concerning adverse side effects.  Alimta was removed from his treatment plan due to renal insufficiency. The patient denies any recent fever, chills, or night sweats. His weight is stable. He met with a  member of the nutritionist team at recent appointments.  He denies any shortness of breath, chest pain, or hemoptysis.  He reports he has a intermittent mild cough which produces phlegm; however, he notes his cough has improved compared to prior. We are monitoring his scans closely due to possible pneumonitis. He denies any nausea, vomiting, diarrhea, or constipation.  He denies any headache or visual changes.  He denies any rashes or skin changes.  He follows closely with neuro oncology regarding his history of metastatic disease to the brain. He recently had a repeat brain MRI and follow up with Dr. Mickeal Skinner. They discussed *** He is here for evaluation and repeat blood work before starting cycle #31.    MEDICAL HISTORY: Past Medical History:  Diagnosis Date   Asthma    as a child   Chronic pain disorder    LT leg and foot   Heart murmur    as a child   Hypertension    Malignant neoplasm of upper lobe of right lung ([facility])    Stroke ([facility])    mini stroke - 2015, some tingling in fingers on right     ALLERGIES:  has No Known Allergies.  MEDICATIONS:  Current Outpatient Medications  Medication Sig Dispense Refill   amLODipine (NORVASC) 10 MG tablet Take 10 mg by mouth daily.     Cholecalciferol 100 MCG (4000 UT) TABS Take 2 tablets by mouth daily.     dexamethasone (DECADRON) 4 MG tablet Take 1 tablet by mouth  daily. 10 tablet 0   diclofenac Sodium (VOLTAREN) 1 % GEL Apply 4 g topically 3 (three) times daily.     doxylamine, Sleep, (UNISOM) 25 MG tablet Take 1 tablet (25 mg total) by mouth at bedtime as needed for sleep. 30 tablet 0   HYDROcodone-acetaminophen (NORCO) 10-325 MG tablet Take 1 tablet by mouth 2 (two) times daily.     oxybutynin (DITROPAN-XL) 10 MG 24 hr tablet Take by mouth.     polyethylene glycol powder (GLYCOLAX/MIRALAX) 17 GM/SCOOP powder      potassium chloride SA (KLOR-CON M) 20 MEQ tablet Take 1 tablet (20 mEq total) by mouth daily. 5 tablet 0   prochlorperazine (COMPAZINE) 10 MG tablet Take 1 tablet (10 mg total) by mouth  every 6 (six) hours as needed. 30 tablet 2   rivaroxaban (XARELTO) 20 MG TABS tablet TAKE 1 TABLET BY MOUTH ONCE A DAY WITH SUPPER 30 tablet 2   senna-docusate (SENOKOT-S) 8.6-50 MG tablet Take 2 tablets by mouth daily.     tadalafil (CIALIS) 20 MG tablet Take by mouth.     tamsulosin (FLOMAX) 0.4 MG CAPS capsule Take 1 capsule (0.4 mg total) by mouth daily. 30 capsule 2   No current facility-administered medications for this visit.    SURGICAL HISTORY:  Past Surgical History:  Procedure Laterality Date   BUNIONECTOMY Left    had hammer toe surgery also and callus removed   BUNIONECTOMY Right    also had hammer toe surgery and callus removed   LEG SURGERY Left    PT reports he has pins placed in his Lt leg   ORIF TIBIA PLATEAU  10/09/2012   Dr Lorin Mercy   ORIF TIBIA PLATEAU Left 10/08/2012   Procedure: OPEN REDUCTION INTERNAL FIXATION (ORIF) TIBIAL PLATEAU;  Surgeon: Marybelle Killings, MD;  Location: [facility];  Service: Orthopedics;  Laterality: Left;   VIDEO BRONCHOSCOPY WITH ENDOBRONCHIAL NAVIGATION N/A 02/25/2020   Procedure: VIDEO BRONCHOSCOPY WITH ENDOBRONCHIAL NAVIGATION with biopsies;  Surgeon: Collene Gobble, MD;  Location: [facility];  Service: Thoracic;  Laterality: N/A;   VIDEO BRONCHOSCOPY WITH ENDOBRONCHIAL ULTRASOUND N/A 02/25/2020   Procedure: VIDEO BRONCHOSCOPY WITH ENDOBRONCHIAL ULTRASOUND;  Surgeon: Collene Gobble, MD;  Location: [facility];  Service: Thoracic;  Laterality: N/A;    REVIEW OF SYSTEMS:   Review of Systems  Constitutional: Negative for appetite change, chills, fatigue, fever and unexpected weight change.  HENT:   Negative for mouth sores, nosebleeds, sore throat and trouble swallowing.   Eyes: Negative for eye problems and icterus.  Respiratory: Negative for cough, hemoptysis, shortness of breath and wheezing.   Cardiovascular: Negative for chest pain and leg swelling.  Gastrointestinal: Negative for abdominal pain, constipation, diarrhea, nausea and vomiting.   Genitourinary: Negative for bladder incontinence, difficulty urinating, dysuria, frequency and hematuria.   Musculoskeletal: Negative for back pain, gait problem, neck pain and neck stiffness.  Skin: Negative for itching and rash.  Neurological: Negative for dizziness, extremity weakness, gait problem, headaches, light-headedness and seizures.  Hematological: Negative for adenopathy. Does not bruise/bleed easily.  Psychiatric/Behavioral: Negative for confusion, depression and sleep disturbance. The patient is not nervous/anxious.     PHYSICAL EXAMINATION:  There were no vitals taken for this visit.  ECOG PERFORMANCE STATUS: {CHL ONC ECOG [id number]  Physical Exam  Constitutional: Oriented to person, place, and time and well-developed, well-nourished, and in no distress. No distress.  HENT:  Head: Normocephalic and atraumatic.  Mouth/Throat: Oropharynx is clear and moist. No oropharyngeal exudate.  Eyes: Conjunctivae  are normal. Right eye exhibits no discharge. Left eye exhibits no discharge. No scleral icterus.  Neck: Normal range of motion. Neck supple.  Cardiovascular: Normal rate, regular rhythm, normal heart sounds and intact distal pulses.   Pulmonary/Chest: Effort normal and breath sounds normal. No respiratory distress. No wheezes. No rales.  Abdominal: Soft. Bowel sounds are normal. Exhibits no distension and no mass. There is no tenderness.  Musculoskeletal: Normal range of motion. Exhibits no edema.  Lymphadenopathy:    No cervical adenopathy.  Neurological: Alert and oriented to person, place, and time. Exhibits normal muscle tone. Gait normal. Coordination normal.  Skin: Skin is warm and dry. No rash noted. Not diaphoretic. No erythema. No pallor.  Psychiatric: Mood, memory and judgment normal.  Vitals reviewed.  LABORATORY DATA: Lab Results  Component Value Date   WBC 10.9 (H) 12/14/2021   HGB 13.0 12/14/2021   HCT 38.3 (L) 12/14/2021   MCV 84.4 12/14/2021    PLT 208 12/14/2021      Chemistry      Component Value Date/Time   NA 136 12/14/2021 1312   K 4.1 12/14/2021 1312   CL 106 12/14/2021 1312   CO2 23 12/14/2021 1312   BUN 34 (H) 12/14/2021 1312   CREATININE 1.66 (H) 12/14/2021 1312      Component Value Date/Time   CALCIUM 9.2 12/14/2021 1312   ALKPHOS 87 12/14/2021 1312   AST 10 (L) 12/14/2021 1312   ALT 17 12/14/2021 1312   BILITOT 0.5 12/14/2021 1312       RADIOGRAPHIC STUDIES:  MR BRAIN W WO CONTRAST  Result Date: 12/14/2021 CLINICAL DATA:  Brain/CNS neoplasm, assess treatment response; metastatic lung cancer EXAM: MRI HEAD WITHOUT AND WITH CONTRAST TECHNIQUE: Multiplanar, multiecho pulse sequences of the brain and surrounding structures were obtained without and with intravenous contrast. CONTRAST:  57mL MULTIHANCE GADOBENATE DIMEGLUMINE 529 MG/ML IV SOLN COMPARISON:  11/03/2021 FINDINGS: Brain: Medial left precentral gyrus lesion has again increased in size now measuring 1.1 x 0.6 cm (previously 0.8 x 0.7 cm). Surrounding edema has decreased. New punctate focus of cortical enhancement in the left parietal lobe (series 11, image 105). No acute infarction. Ventricles and sulci are stable in size and configuration. Chronic infarcts of bilateral basal ganglia and adjacent white matter with associated chronic blood products. Vascular: Major vessel flow voids at the skull base are preserved. Skull and upper cervical spine: Normal marrow signal is preserved. Sinuses/Orbits: Paranasal sinuses are aerated. Orbits are unremarkable. Other: Sella is unremarkable.  Mastoid air cells are clear. IMPRESSION: Further increase in size of left precentral gyrus lesion. However, surrounding edema has decreased. New punctate focus of left parietal cortical enhancement. Recommend attention on follow-up. Electronically Signed   By: Macy Mis M.D.   On: 12/14/2021 11:16     ASSESSMENT/PLAN:  This is a very pleasant 66 year old African-American male  with stage IV non-small cell lung cancer, favored to be adenocarcinoma.  He presented with posterior right upper lobe nodule as well as right hilar, infrahilar, subcarinal, and right paratracheal lymphadenopathy.  He also has a 1.4 cm x 1 cm soft tissue nodule in the skin/subcutaneous fat in the left axilla.  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  He was diagnosed in August 2021.  Molecular studies by [facility] are negative for any actionable mutations.   The patient completed SRS to the small metastatic brain lesion under the care of Dr. [name] on 03/18/20  The patient is currently undergoing  palliative systemic chemotherapy with carboplatin for an AUC of 5, Alimta 500 mg per metered squared, and Keytruda 200 mg IV every 3 weeks.  He is status post 30 cycles. The dose of alimta was reduced to 400 mg/m2 due to renal insufficiency. Alimta was ultimately removed from the treatment plan with cycle #11 due to renal insufficiency   The patient completed SBRT to the enlarging right upper lobe lung mass under the care of Dr. [name] on 04/06/21.   Labs were reviewed.  Recommend that he proceed with cycle #31 today scheduled. He is ok to treat with his creatinine of *** today.    We need to continue to monitor the increased airspace opacity in the right upper lobe surrounding the area of prior lesion. We will likely arrange for a restaging CT scan to be performed prior to the next cycle of treatment.   We will see the patient back for follow-up visit in 3 weeks for evaluation to review his scan results before starting cycle #32.  The patient was advised to call immediately if he has any concerning symptoms in the interval. The patient voices understanding of current disease status and treatment options and is in agreement with the current care plan. All questions were answered. The patient knows to call the clinic with any problems, questions or concerns. We can certainly see the  patient much sooner if necessary  No orders of the defined types were placed in this encounter.    I spent {CHL ONC TIME VISIT - IJFTZ:[id number]} counseling the patient face to face. The total time spent in the appointment was {CHL ONC TIME VISIT - WCNPS:[id number]}.  Sharisa Toves L Lee-Anne Flicker, PA-C 12/24/21

## 2021-12-26 ENCOUNTER — Inpatient Hospital Stay: Payer: No Typology Code available for payment source | Attending: Internal Medicine

## 2021-12-26 DIAGNOSIS — Z5112 Encounter for antineoplastic immunotherapy: Secondary | ICD-10-CM | POA: Insufficient documentation

## 2021-12-26 DIAGNOSIS — C3412 Malignant neoplasm of upper lobe, left bronchus or lung: Secondary | ICD-10-CM | POA: Insufficient documentation

## 2021-12-26 DIAGNOSIS — C7931 Secondary malignant neoplasm of brain: Secondary | ICD-10-CM | POA: Insufficient documentation

## 2021-12-26 DIAGNOSIS — Z79899 Other long term (current) drug therapy: Secondary | ICD-10-CM | POA: Insufficient documentation

## 2021-12-29 ENCOUNTER — Inpatient Hospital Stay (HOSPITAL_BASED_OUTPATIENT_CLINIC_OR_DEPARTMENT_OTHER): Payer: No Typology Code available for payment source | Admitting: Internal Medicine

## 2021-12-29 ENCOUNTER — Other Ambulatory Visit: Payer: Self-pay

## 2021-12-29 VITALS — BP 150/83 | HR 82 | Temp 97.4°F | Resp 18 | Ht 74.0 in | Wt 182.5 lb

## 2021-12-29 DIAGNOSIS — C7931 Secondary malignant neoplasm of brain: Secondary | ICD-10-CM

## 2021-12-29 DIAGNOSIS — Z79899 Other long term (current) drug therapy: Secondary | ICD-10-CM | POA: Diagnosis not present

## 2021-12-29 DIAGNOSIS — C3412 Malignant neoplasm of upper lobe, left bronchus or lung: Secondary | ICD-10-CM | POA: Diagnosis present

## 2021-12-29 DIAGNOSIS — Z5112 Encounter for antineoplastic immunotherapy: Secondary | ICD-10-CM | POA: Diagnosis present

## 2021-12-29 MED ORDER — DEXAMETHASONE 1 MG PO TABS: 1.0000 mg | ORAL_TABLET | Freq: Every day | ORAL | 1 refills | Status: DC

## 2021-12-29 NOTE — Progress Notes (Signed)
[facility] [place], [place] [address] [phone]   Interval Evaluation  Date of Service: 12/29/21 Patient Name: Randy Andersen Patient MRN: [id number] Patient DOB: 22-Jan-1956 Provider: Ventura Sellers, MD  Identifying Statement:  Randy Andersen is a 66 y.o. 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  No changes noted today, denies right sided weakness.  Some issues with fatigue, memory, but they felt improved on the daily decadron (now stopped).  Denies headaches, seizures.  H+P (12/21/20) Patient presents for follow up after recent MRI.  Randy Andersen complains of short term memory impairment, cognitive "slowing" since starting chemotherapy last year.  Randy Andersen also describes some difficulty getting words out or finding the correct words at times.  No issues with comprehension.  Randy Andersen is otherwise functional, independent.  Randy Andersen is able to cook, pay the bills, drives himself.  Currently on immuotherapy with Dr. Julien Nordmann.  Does acknowledge stroke ~10 years ago which affected his right sided sensation.   Medications: Current Outpatient Medications on File Prior to Visit  Medication Sig Dispense Refill   amLODipine (NORVASC) 10 MG tablet Take 10 mg by mouth daily.     Cholecalciferol 100 MCG (4000 UT) TABS Take 2 tablets by mouth daily.     dexamethasone (DECADRON) 4 MG tablet Take 1 tablet by mouth daily.  10 tablet 0   diclofenac Sodium (VOLTAREN) 1 % GEL Apply 4 g topically 3 (three) times daily.     doxylamine, Sleep, (UNISOM) 25 MG tablet Take 1 tablet (25 mg total) by mouth at bedtime as needed for sleep. 30 tablet 0   HYDROcodone-acetaminophen (NORCO) 10-325 MG tablet Take 1 tablet by mouth 2 (two) times daily.     oxybutynin (DITROPAN-XL) 10 MG 24 hr tablet Take by mouth.     polyethylene glycol powder (GLYCOLAX/MIRALAX) 17 GM/SCOOP powder      potassium chloride SA (KLOR-CON M) 20 MEQ tablet Take 1 tablet (20 mEq total) by mouth daily. 5 tablet 0   prochlorperazine (COMPAZINE) 10 MG tablet Take 1 tablet (10 mg total) by mouth every 6 (six) hours as needed. 30 tablet 2   rivaroxaban (XARELTO) 20 MG TABS tablet TAKE 1 TABLET BY MOUTH ONCE A DAY WITH SUPPER 30 tablet 2   senna-docusate (SENOKOT-S) 8.6-50 MG tablet Take 2 tablets by mouth daily.     tadalafil (CIALIS) 20 MG tablet Take by mouth.     tamsulosin (FLOMAX) 0.4 MG CAPS capsule Take 1 capsule (0.4 mg total) by mouth daily. 30 capsule 2   No current facility-administered medications on file prior to visit.    Allergies: No Known Allergies Past Medical History:  Past Medical History:  Diagnosis Date   Asthma    as a child   Chronic pain disorder    LT leg and foot   Heart murmur    as a child   Hypertension    Malignant neoplasm of  upper lobe of right lung (HCC)    Stroke ([place])    mini stroke - 2015, some tingling in fingers on right    Past Surgical History:  Past Surgical History:  Procedure Laterality Date   BUNIONECTOMY Left    had hammer toe surgery also and callus removed   BUNIONECTOMY Right    also had hammer toe surgery and callus removed   LEG SURGERY Left    PT reports Randy Andersen has pins placed in his Lt leg   ORIF TIBIA PLATEAU  10/09/2012   Dr Lorin Mercy   ORIF TIBIA PLATEAU Left 10/08/2012   Procedure: OPEN REDUCTION INTERNAL FIXATION (ORIF) TIBIAL PLATEAU;  Surgeon: Marybelle Killings, MD;  Location: [facility];  Service:  Orthopedics;  Laterality: Left;   VIDEO BRONCHOSCOPY WITH ENDOBRONCHIAL NAVIGATION N/A 02/25/2020   Procedure: VIDEO BRONCHOSCOPY WITH ENDOBRONCHIAL NAVIGATION with biopsies;  Surgeon: Collene Gobble, MD;  Location: [facility] OR;  Service: Thoracic;  Laterality: N/A;   VIDEO BRONCHOSCOPY WITH ENDOBRONCHIAL ULTRASOUND N/A 02/25/2020   Procedure: VIDEO BRONCHOSCOPY WITH ENDOBRONCHIAL ULTRASOUND;  Surgeon: Collene Gobble, MD;  Location: [facility];  Service: Thoracic;  Laterality: N/A;   Social History:  Social History   Socioeconomic History   Marital status: Single    Spouse name: Not on file   Number of children: Not on file   Years of education: Not on file   Highest education level: Not on file  Occupational History   Not on file  Tobacco Use   Smoking status: Every Day    Packs/day: 2.00    Years: 28.00    Total pack years: 56.00    Types: Cigarettes   Smokeless tobacco: Never  Vaping Use   Vaping Use: Never used  Substance and Sexual Activity   Alcohol use: No   Drug use: No   Sexual activity: Not on file  Other Topics Concern   Not on file  Social History Narrative   Not on file   Social Determinants of Health   Financial Resource Strain: Medium Risk (03/11/2020)   Overall Financial Resource Strain (CARDIA)    Difficulty of Paying Living Expenses: Somewhat hard  Food Insecurity: Not on file  Transportation Needs: Not on file  Physical Activity: Insufficiently Active (03/11/2020)   Exercise Vital Sign    Days of Exercise per Week: 3 days    Minutes of Exercise per Session: 20 min  Stress: Not on file  Social Connections: Socially Isolated (03/11/2020)   Social Connection and Isolation Panel [NHANES]    Frequency of Communication with Friends and Family: Once a week    Frequency of Social Gatherings with Friends and Family: Once a week    Attends Religious Services: Never    Marine scientist or Organizations: No    Attends Music therapist: Never    Marital  Status: Never married  Human resources officer Violence: Not on file   Family History:  Family History  Problem Relation Age of Onset   Cancer Mother        Intestinal metastatic to ovaries    Review of Systems: Constitutional: Doesn't report fevers, chills or abnormal weight loss Eyes: Doesn't report blurriness of vision Ears, nose, mouth, throat, and face: Doesn't report sore throat Respiratory: Doesn't report cough, dyspnea or wheezes Cardiovascular: Doesn't report palpitation, chest discomfort  Gastrointestinal:  Doesn't report nausea, constipation, diarrhea GU: Doesn't report incontinence Skin: Doesn't report skin rashes Neurological: Per HPI Musculoskeletal: Doesn't report joint pain Behavioral/Psych: Doesn't  report anxiety  Physical Exam: Vitals:   12/29/21 1157  BP: (!) 150/83  Pulse: 82  Resp: 18  Temp: (!) 97.4 F (36.3 C)  SpO2: 100%   KPS: 80. General: Alert, cooperative, pleasant, in no acute distress Head: Normal EENT: No conjunctival injection or scleral icterus.  Lungs: Resp effort normal Cardiac: Regular rate Abdomen: Non-distended abdomen Skin: No rashes cyanosis or petechiae. Extremities: No clubbing or edema  Neurologic Exam: Mental Status: Awake, alert, attentive to examiner. Oriented to self and environment. Language is fluent with intact comprehension.  Some psychomotor slowing. Cranial Nerves: Visual acuity is grossly normal. Visual fields are full. Extra-ocular movements intact. No ptosis. Face is symmetric Motor: Tone and bulk are normal. Power is full in both arms and legs. Reflexes are symmetric, no pathologic reflexes present.  Sensory: Intact to light touch Gait: Normal.   Labs: I have reviewed the data as listed    Component Value Date/Time   NA 136 12/14/2021 1312   K 4.1 12/14/2021 1312   CL 106 12/14/2021 1312   CO2 23 12/14/2021 1312   GLUCOSE 141 (H) 12/14/2021 1312   BUN 34 (H) 12/14/2021 1312   CREATININE 1.66 (H) 12/14/2021  1312   CALCIUM 9.2 12/14/2021 1312   PROT 7.3 12/14/2021 1312   ALBUMIN 3.6 12/14/2021 1312   AST 10 (L) 12/14/2021 1312   ALT 17 12/14/2021 1312   ALKPHOS 87 12/14/2021 1312   BILITOT 0.5 12/14/2021 1312   GFRNONAA 45 (L) 12/14/2021 1312   GFRAA 52 (L) 04/21/2020 1403   Lab Results  Component Value Date   WBC 10.9 (H) 12/14/2021   NEUTROABS 8.8 (H) 12/14/2021   HGB 13.0 12/14/2021   HCT 38.3 (L) 12/14/2021   MCV 84.4 12/14/2021   PLT 208 12/14/2021    Imaging:  MR BRAIN W WO CONTRAST  Result Date: 12/14/2021 CLINICAL DATA:  Brain/CNS neoplasm, assess treatment response; metastatic lung cancer EXAM: MRI HEAD WITHOUT AND WITH CONTRAST TECHNIQUE: Multiplanar, multiecho pulse sequences of the brain and surrounding structures were obtained without and with intravenous contrast. CONTRAST:  38mL MULTIHANCE GADOBENATE DIMEGLUMINE 529 MG/ML IV SOLN COMPARISON:  11/03/2021 FINDINGS: Brain: Medial left precentral gyrus lesion has again increased in size now measuring 1.1 x 0.6 cm (previously 0.8 x 0.7 cm). Surrounding edema has decreased. New punctate focus of cortical enhancement in the left parietal lobe (series 11, image 105). No acute infarction. Ventricles and sulci are stable in size and configuration. Chronic infarcts of bilateral basal ganglia and adjacent white matter with associated chronic blood products. Vascular: Major vessel flow voids at the skull base are preserved. Skull and upper cervical spine: Normal marrow signal is preserved. Sinuses/Orbits: Paranasal sinuses are aerated. Orbits are unremarkable. Other: Sella is unremarkable.  Mastoid air cells are clear. IMPRESSION: Further increase in size of left precentral gyrus lesion. However, surrounding edema has decreased. New punctate focus of left parietal cortical enhancement. Recommend attention on follow-up. Electronically Signed   By: Macy Mis M.D.   On: 12/14/2021 11:16     [facility] Clinician Interpretation: I have personally  reviewed the radiological images as listed.  My interpretation, in the context of the patient's clinical presentation, is treatment effect vs true progression   Assessment/Plan Malignant neoplasm metastatic to brain [facility]) [C79.31]  Randy Andersen is clinically stable today.  MRI brain again demonstrates focus of progression within previously treated left pre-central gyrus metastasis which is subtly progressive.  Etiology is either radiation treatment effect (favored) or tumor progression.  Again, other treated lesions are stable.  Despite the eloquent location Randy Andersen has no motor deficits at this time.  Case was reviewed in detail in brain/spine tumor board this week; recommendation was made for continued close survillance if asymptomatic.  Not good candidacy for biopsy or LITT given location.    With further progression, we could consider re-irradiation with concurrent avastin to protect against acute exacerbation of radionecrosis.   Decadron may be decreased to 1mg  daily given modest clinical benefit, concurrent immunotherapy.  Randy Andersen will continue to follow with Dr. Julien Nordmann, still doing well with [place].  We appreciate the opportunity to participate in the care of Randy Andersen.    We ask that Randy Andersen return to clinic in 2 months following next brain MRI, or sooner as needed.  All questions were answered. The patient knows to call the clinic with any problems, questions or concerns. No barriers to learning were detected.  The total time spent in the encounter was 30 minutes and more than 50% was on counseling and review of test results   Ventura Sellers, MD Medical Director of Neuro-Oncology [facility] [place] 12/29/21 11:37 AM

## 2022-01-02 ENCOUNTER — Other Ambulatory Visit: Payer: Self-pay | Admitting: Radiation Therapy

## 2022-01-02 ENCOUNTER — Ambulatory Visit: Payer: Medicare PPO

## 2022-01-02 ENCOUNTER — Other Ambulatory Visit: Payer: Medicare PPO

## 2022-01-02 ENCOUNTER — Ambulatory Visit: Payer: Medicare PPO | Admitting: Physician Assistant

## 2022-01-02 NOTE — Progress Notes (Signed)
[facility] OFFICE PROGRESS NOTE  [name], MD [address] [place] [address]  DIAGNOSIS: Stage IV non-small cell lung cancer, favored to be adenocarcinoma.  He presented with posterior right upper lobe nodule as well as right hilar, infrahilar, subcarinal, and right paratracheal lymphadenopathy.  He also has a 1.4 cm x 1 cm soft tissue nodule in the skin/subcutaneous fat in the left axilla.  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  He was diagnosed in August 2021   Molecular Biomarkers: BIOMARKER(S)         % CFDNA OR AMPLIFICATION       ASSOCIATED FDA-APPROVED THERAPIES        CLINICAL TRIAL AVAILABILITY [id number] 2.9% None    Yes [id number] 0.4% None    Yes [id number] 0.2% None    Yes  PRIOR THERAPY: 1) SRS to the small metastatic brain lesion under the care of Dr. [name] on 03/18/20 2) Radiation to the enlarging right upper lobe lung mass under the care of Dr. [name]. Last dose on 04/06/21.   CURRENT THERAPY:  Palliative systemic chemotherapy with carboplatin for an AUC of 5, Alimta 500 mg/m2, and Keytruda 200 mg IV every 3 weeks. First dose expected on 04/07/20. Status post 30 cycles. Starting from cycle #5 he will be on maintenance treatment with Alimta and Keytruda every 3 weeks. His dose of Alimta was reduced to 400 mg/m2. Alimta removed starting from cycle #11 due to renal insuffiencey.     INTERVAL HISTORY: Randy Andersen 66 y.o. male returns today for a follow-up visit.  The patient is feeling well today without any concerning complaints.  He is currently undergoing single agent immunotherapy with Keytruda.  He is tolerating treatment well without any concerning adverse side effects.  Alimta was removed from his treatment plan due to renal insufficiency. The patient denies any recent fever, chills, or night sweats. His weight is stable. He was given complementary ensures at his last appointment due to financial  constraints and a brown bag with food. He states this helped a lot and gained weight. He is wondering how often he qualifies for this and if he would qualify for today. He met with a  member of the nutritionist team at recent appointments. He denies any shortness of breath, chest pain, or hemoptysis.  He reports he has a intermittent mild cough which produces phlegm; however, he notes his cough has greatly improved compared to prior. We are monitoring his scans closely due to possible pneumonitis. He denies any nausea, vomiting, diarrhea, or constipation.  He denies any headache or visual changes.  He denies any rashes or skin changes.  He follows closely with neuro oncology regarding his history of metastatic disease to the brain. He follows closely with Dr. Mickeal Skinner. Dr. Mickeal Skinner recommended he take decadron. The patient states he has not received this yet from the [place]. He is here today for evaluation and repeat blood work before starting cycle #31   MEDICAL HISTORY: Past Medical History:  Diagnosis Date   Asthma    as a child   Chronic pain disorder    LT leg and foot   Heart murmur    as a child   Hypertension    Malignant neoplasm of upper lobe of right lung ([place])    Stroke ([place])    mini stroke - 2015, some tingling in fingers on right     ALLERGIES:  has No Known Allergies.  MEDICATIONS:  Current Outpatient Medications  Medication Sig Dispense Refill   amLODipine (NORVASC) 10 MG tablet Take 10 mg by mouth daily.     Cholecalciferol 100 MCG (4000 UT) TABS Take 2 tablets by mouth daily.     dexamethasone (DECADRON) 1 MG tablet Take 1 tablet (1 mg total) by mouth daily. 60 tablet 1   diclofenac Sodium (VOLTAREN) 1 % GEL Apply 4 g topically 3 (three) times daily.     doxylamine, Sleep, (UNISOM) 25 MG tablet Take 1 tablet (25 mg total) by mouth at bedtime as needed for sleep. 30 tablet 0   HYDROcodone-acetaminophen (NORCO) 10-325 MG tablet Take 1 tablet by mouth 2 (two) times daily.      HYDROcodone-acetaminophen (NORCO/VICODIN) 5-325 MG tablet Take 1 tablet by mouth every 8 (eight) hours as needed. 5 tablet 0   lidocaine (LIDODERM) 5 % Place 1 patch onto the skin daily. Remove & Discard patch within 12 hours or as directed by MD 15 patch 0   oxybutynin (DITROPAN-XL) 10 MG 24 hr tablet Take by mouth.     polyethylene glycol powder (GLYCOLAX/MIRALAX) 17 GM/SCOOP powder      potassium chloride SA (KLOR-CON M) 20 MEQ tablet Take 1 tablet (20 mEq total) by mouth daily. 5 tablet 0   prochlorperazine (COMPAZINE) 10 MG tablet Take 1 tablet (10 mg total) by mouth every 6 (six) hours as needed. 30 tablet 2   rivaroxaban (XARELTO) 20 MG TABS tablet TAKE 1 TABLET BY MOUTH ONCE A DAY WITH SUPPER 30 tablet 2   senna-docusate (SENOKOT-S) 8.6-50 MG tablet Take 2 tablets by mouth daily.     tadalafil (CIALIS) 20 MG tablet Take by mouth.     tamsulosin (FLOMAX) 0.4 MG CAPS capsule Take 1 capsule (0.4 mg total) by mouth daily. 30 capsule 2   No current facility-administered medications for this visit.   Facility-Administered Medications Ordered in Other Visits  Medication Dose Route Frequency Provider Last Rate Last Admin   0.9 %  sodium chloride infusion   Intravenous Once [name], MD       pembrolizumab [facility]) 200 mg in sodium chloride 0.9 % 50 mL chemo infusion  200 mg Intravenous Once [name], MD        SURGICAL HISTORY:  Past Surgical History:  Procedure Laterality Date   BUNIONECTOMY Left    had hammer toe surgery also and callus removed   BUNIONECTOMY Right    also had hammer toe surgery and callus removed   LEG SURGERY Left    PT reports he has pins placed in his Lt leg   ORIF TIBIA PLATEAU  10/09/2012   Dr Lorin Mercy   ORIF TIBIA PLATEAU Left 10/08/2012   Procedure: OPEN REDUCTION INTERNAL FIXATION (ORIF) TIBIAL PLATEAU;  Surgeon: Marybelle Killings, MD;  Location: [facility];  Service: Orthopedics;  Laterality: Left;   VIDEO BRONCHOSCOPY WITH ENDOBRONCHIAL NAVIGATION N/A  02/25/2020   Procedure: VIDEO BRONCHOSCOPY WITH ENDOBRONCHIAL NAVIGATION with biopsies;  Surgeon: Collene Gobble, MD;  Location: [facility];  Service: Thoracic;  Laterality: N/A;   VIDEO BRONCHOSCOPY WITH ENDOBRONCHIAL ULTRASOUND N/A 02/25/2020   Procedure: VIDEO BRONCHOSCOPY WITH ENDOBRONCHIAL ULTRASOUND;  Surgeon: Collene Gobble, MD;  Location: [facility];  Service: Thoracic;  Laterality: N/A;    REVIEW OF SYSTEMS:   Review of Systems  Constitutional: Negative for appetite change, chills, fatigue, fever and unexpected weight change.  HENT: Negative for mouth sores, nosebleeds, sore throat and trouble swallowing.   Eyes: Negative for eye problems and  icterus.  Respiratory: Positive for mild cough (improved compared to prior). Negative for hemoptysis, shortness of breath and wheezing.   Cardiovascular: Negative for chest pain and leg swelling.  Gastrointestinal: Negative for abdominal pain, constipation, diarrhea, nausea and vomiting.  Genitourinary: Negative for bladder incontinence, difficulty urinating, dysuria, frequency and hematuria.   Musculoskeletal: Negative for back pain, gait problem, neck pain and neck stiffness.  Skin: Negative for itching and rash.  Neurological: Negative for dizziness, extremity weakness, gait problem, headaches, light-headedness and seizures.  Hematological: Negative for adenopathy. Does not bruise/bleed easily.  Psychiatric/Behavioral: Negative for confusion, depression and sleep disturbance. The patient is not nervous/anxious.   PHYSICAL EXAMINATION:  Blood pressure (!) 150/82, pulse 81, temperature 98.1 F (36.7 C), resp. rate 20, weight 184 lb 11.2 oz (83.8 kg), SpO2 100 %.  ECOG PERFORMANCE STATUS: 1  Physical Exam  Constitutional: Oriented to person, place, and time and well-developed, well-nourished, and in no distress.  HENT:  Head: Normocephalic and atraumatic.  Mouth/Throat: Oropharynx is clear and moist. No oropharyngeal exudate.  Eyes: Conjunctivae  are normal. Right eye exhibits no discharge. Left eye exhibits no discharge. No scleral icterus.  Neck: Normal range of motion. Neck supple.  Cardiovascular: Normal rate, regular rhythm, normal heart sounds and intact distal pulses.   Pulmonary/Chest: Effort normal and breath sounds normal. No respiratory distress. No wheezes. No rales.  Abdominal: Soft. Bowel sounds are normal. Exhibits no distension and no mass. There is no tenderness.  Musculoskeletal: Normal range of motion. Exhibits no edema.  Lymphadenopathy:    No cervical adenopathy.  Neurological: Alert and oriented to person, place, and time. Exhibits normal muscle tone. Gait normal. Coordination normal.  Skin: Skin is warm and dry. No rash noted. Not diaphoretic. No erythema. No pallor.  Psychiatric: Mood, memory and judgment normal.  Vitals reviewed.  LABORATORY DATA: Lab Results  Component Value Date   WBC 4.3 01/05/2022   HGB 13.2 01/05/2022   HCT 38.9 (L) 01/05/2022   MCV 86.3 01/05/2022   PLT 222 01/05/2022      Chemistry      Component Value Date/Time   NA 139 01/05/2022 1420   K 4.0 01/05/2022 1420   CL 109 01/05/2022 1420   CO2 25 01/05/2022 1420   BUN 16 01/05/2022 1420   CREATININE 1.51 (H) 01/05/2022 1420      Component Value Date/Time   CALCIUM 9.6 01/05/2022 1420   ALKPHOS 114 01/05/2022 1420   AST 11 (L) 01/05/2022 1420   ALT 10 01/05/2022 1420   BILITOT 0.3 01/05/2022 1420       RADIOGRAPHIC STUDIES:  DG Lumbar Spine Complete  Result Date: 01/03/2022 CLINICAL DATA:  Worsening lumbar region pain in the setting of malignancy. Pain extends to both legs. EXAM: LUMBAR SPINE - COMPLETE 4+ VIEW COMPARISON:  04/03/2017 FINDINGS: Using the same numbering terminology, there are 13 sets of ribs with the lowest ribs described at L1. Alignment shows minimal scoliotic curvature convex to the right. There is no significant disc space narrowing. There is ordinary lower lumbar facet osteoarthritis. No evidence  of fracture or focal bone lesion. IMPRESSION: First lumbar segment ribs, as numbered previously. No evidence of regional fracture. Mild lower lumbar facet osteoarthritis. Electronically Signed   By: Nelson Chimes M.D.   On: 01/03/2022 13:50   MR BRAIN W WO CONTRAST  Result Date: 12/14/2021 CLINICAL DATA:  Brain/CNS neoplasm, assess treatment response; metastatic lung cancer EXAM: MRI HEAD WITHOUT AND WITH CONTRAST TECHNIQUE: Multiplanar, multiecho pulse sequences of the  brain and surrounding structures were obtained without and with intravenous contrast. CONTRAST:  57mL MULTIHANCE GADOBENATE DIMEGLUMINE 529 MG/ML IV SOLN COMPARISON:  11/03/2021 FINDINGS: Brain: Medial left precentral gyrus lesion has again increased in size now measuring 1.1 x 0.6 cm (previously 0.8 x 0.7 cm). Surrounding edema has decreased. New punctate focus of cortical enhancement in the left parietal lobe (series 11, image 105). No acute infarction. Ventricles and sulci are stable in size and configuration. Chronic infarcts of bilateral basal ganglia and adjacent white matter with associated chronic blood products. Vascular: Major vessel flow voids at the skull base are preserved. Skull and upper cervical spine: Normal marrow signal is preserved. Sinuses/Orbits: Paranasal sinuses are aerated. Orbits are unremarkable. Other: Sella is unremarkable.  Mastoid air cells are clear. IMPRESSION: Further increase in size of left precentral gyrus lesion. However, surrounding edema has decreased. New punctate focus of left parietal cortical enhancement. Recommend attention on follow-up. Electronically Signed   By: Macy Mis M.D.   On: 12/14/2021 11:16     ASSESSMENT/PLAN:  This is a very pleasant 66 year old African-American male with stage IV non-small cell lung cancer, favored to be adenocarcinoma.  He presented with posterior right upper lobe nodule as well as right hilar, infrahilar, subcarinal, and right paratracheal lymphadenopathy.  He  also has a 1.4 cm x 1 cm soft tissue nodule in the skin/subcutaneous fat in the left axilla.  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  He was diagnosed in August 2021.  Molecular studies by [facility] are negative for any actionable mutations.   The patient completed SRS to the small metastatic brain lesion under the care of Dr. [name] on 03/18/20   The patient is currently undergoing palliative systemic chemotherapy with carboplatin for an AUC of 5, Alimta 500 mg per metered squared, and Keytruda 200 mg IV every 3 weeks.  He is status post 30 cycles. The dose of alimta was reduced to 400 mg/m2 due to renal insufficiency. Alimta was ultimately removed from the treatment plan with cycle #11 due to renal insufficiency   The patient completed SBRT to the enlarging right upper lobe lung mass under the care of Dr. [name] on 04/06/21.    Labs were reviewed.  Recommend that he proceed with cycle #31 today scheduled. He is ok to treat with his creatinine of 1.51 today.      I will arrange for a restaging CT scan before his next cycle of treatment to continue to monitor the increased airspace opacity in the right upper lobe surrounding the area of prior lesion.  I will order this without IV contrast due to his CKD.  The patient reports he has financial constraints which is the main underlying factor of his prior weight loss.  He was given complementary ensures and a [employer] of food at his last appointment.  He is wondering how often he can qualify for this.  I have reached out to the dietitian team who are going to assist him with this request in the infusion room today with complementary Ensure and food supplies.   We will see the patient back for follow-up visit in 3 weeks for evaluation to review his scan results before starting cycle #32.  We will continue to follow closely with neuro oncology for his history of metastatic disease to the brain.  I encouraged the patient to  ensure he gets the Decadron from the [facility].   The patient was advised to call  immediately if she has any concerning symptoms in the interval. The patient voices understanding of current disease status and treatment options and is in agreement with the current care plan. All questions were answered. The patient knows to call the clinic with any problems, questions or concerns. We can certainly see the patient much sooner if necessary      Orders Placed This Encounter  Procedures   CT Chest Wo Contrast    Standing Status:   Future    Standing Expiration Date:   01/05/2023    Order Specific Question:   Preferred imaging location?    Answer:   [facility]   CT Abdomen Pelvis Wo Contrast    Standing Status:   Future    Standing Expiration Date:   01/05/2023    Order Specific Question:   Preferred imaging location?    Answer:   [facility]    Order Specific Question:   Is Oral Contrast requested for this exam?    Answer:   Yes, Per Radiology protocol     The total time spent in the appointment was 20-29 minutes.  Vester Balthazor L Ekam Besson, PA-C 01/05/22

## 2022-01-03 ENCOUNTER — Other Ambulatory Visit: Payer: Medicare PPO

## 2022-01-03 ENCOUNTER — Ambulatory Visit: Payer: Medicare PPO | Admitting: Internal Medicine

## 2022-01-03 ENCOUNTER — Ambulatory Visit: Payer: Medicare PPO

## 2022-01-03 ENCOUNTER — Encounter (HOSPITAL_COMMUNITY): Payer: Self-pay

## 2022-01-03 ENCOUNTER — Ambulatory Visit (HOSPITAL_COMMUNITY)
Admission: EM | Admit: 2022-01-03 | Discharge: 2022-01-03 | Disposition: A | Discharge status: Home / Self Care | Payer: Medicare PPO | Attending: Physician Assistant | Admitting: Physician Assistant

## 2022-01-03 ENCOUNTER — Ambulatory Visit (INDEPENDENT_AMBULATORY_CARE_PROVIDER_SITE_OTHER): Payer: Medicare PPO

## 2022-01-03 DIAGNOSIS — M5441 Lumbago with sciatica, right side: Secondary | ICD-10-CM

## 2022-01-03 DIAGNOSIS — M47816 Spondylosis without myelopathy or radiculopathy, lumbar region: Secondary | ICD-10-CM | POA: Diagnosis not present

## 2022-01-03 DIAGNOSIS — M545 Low back pain, unspecified: Secondary | ICD-10-CM

## 2022-01-03 DIAGNOSIS — M5442 Lumbago with sciatica, left side: Secondary | ICD-10-CM | POA: Diagnosis not present

## 2022-01-03 DIAGNOSIS — M419 Scoliosis, unspecified: Secondary | ICD-10-CM | POA: Diagnosis not present

## 2022-01-03 IMAGING — DX DG LUMBAR SPINE COMPLETE 4+V
5 series · 5 of 5 positions shown · non-contrast
Comparison: [DATE]

CLINICAL DATA: Worsening lumbar region pain in the setting of
malignancy. Pain extends to both legs.

EXAM:
LUMBAR SPINE - COMPLETE 4+ VIEW

[l-spine ap]
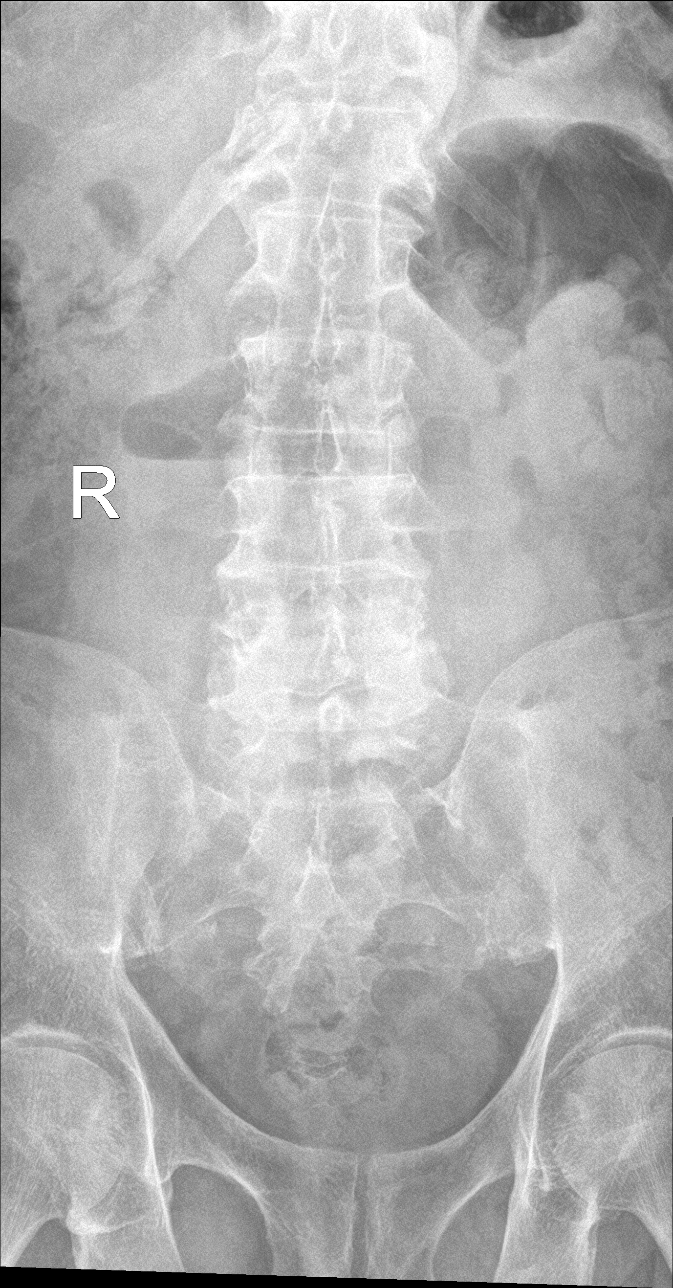

[l-spine obl (1 of 2)]
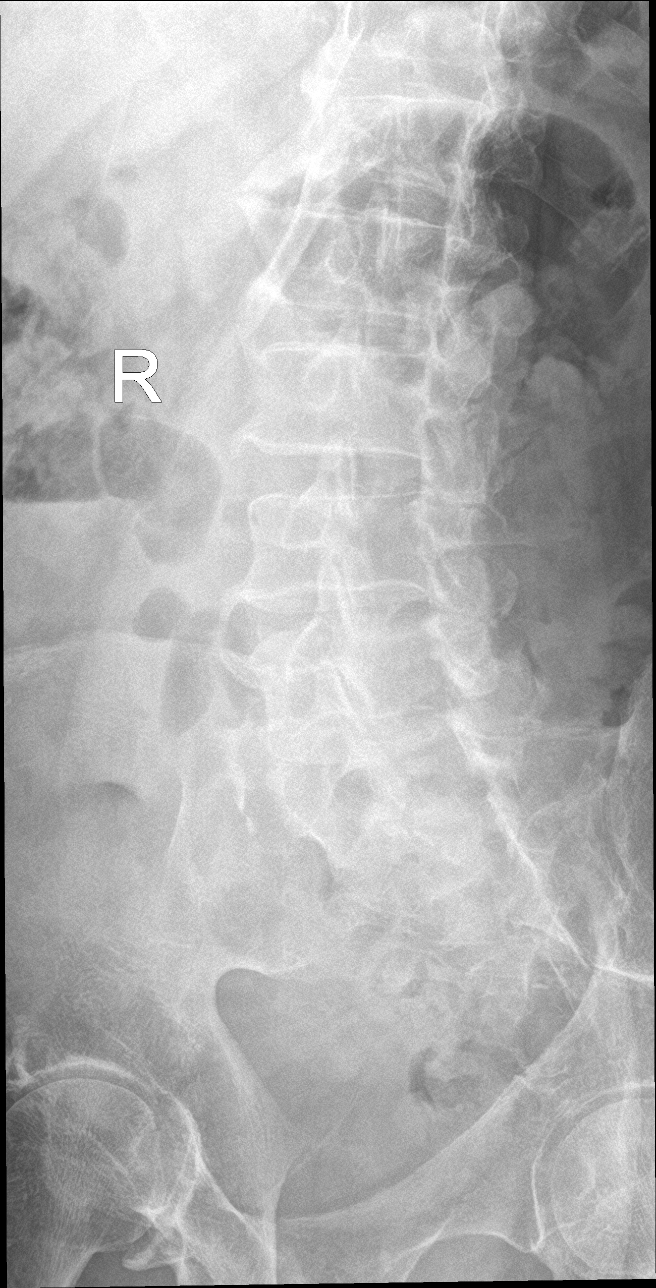

[l-spine obl (2 of 2)]
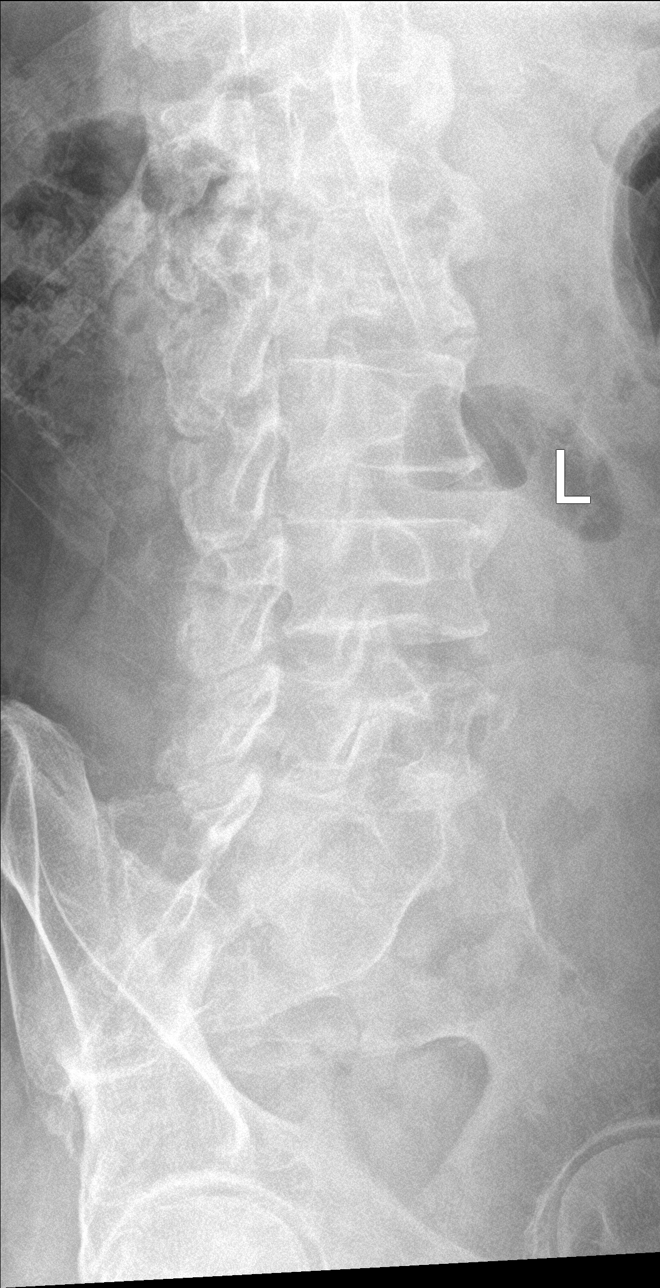

[l-spine lat (1 of 2)]
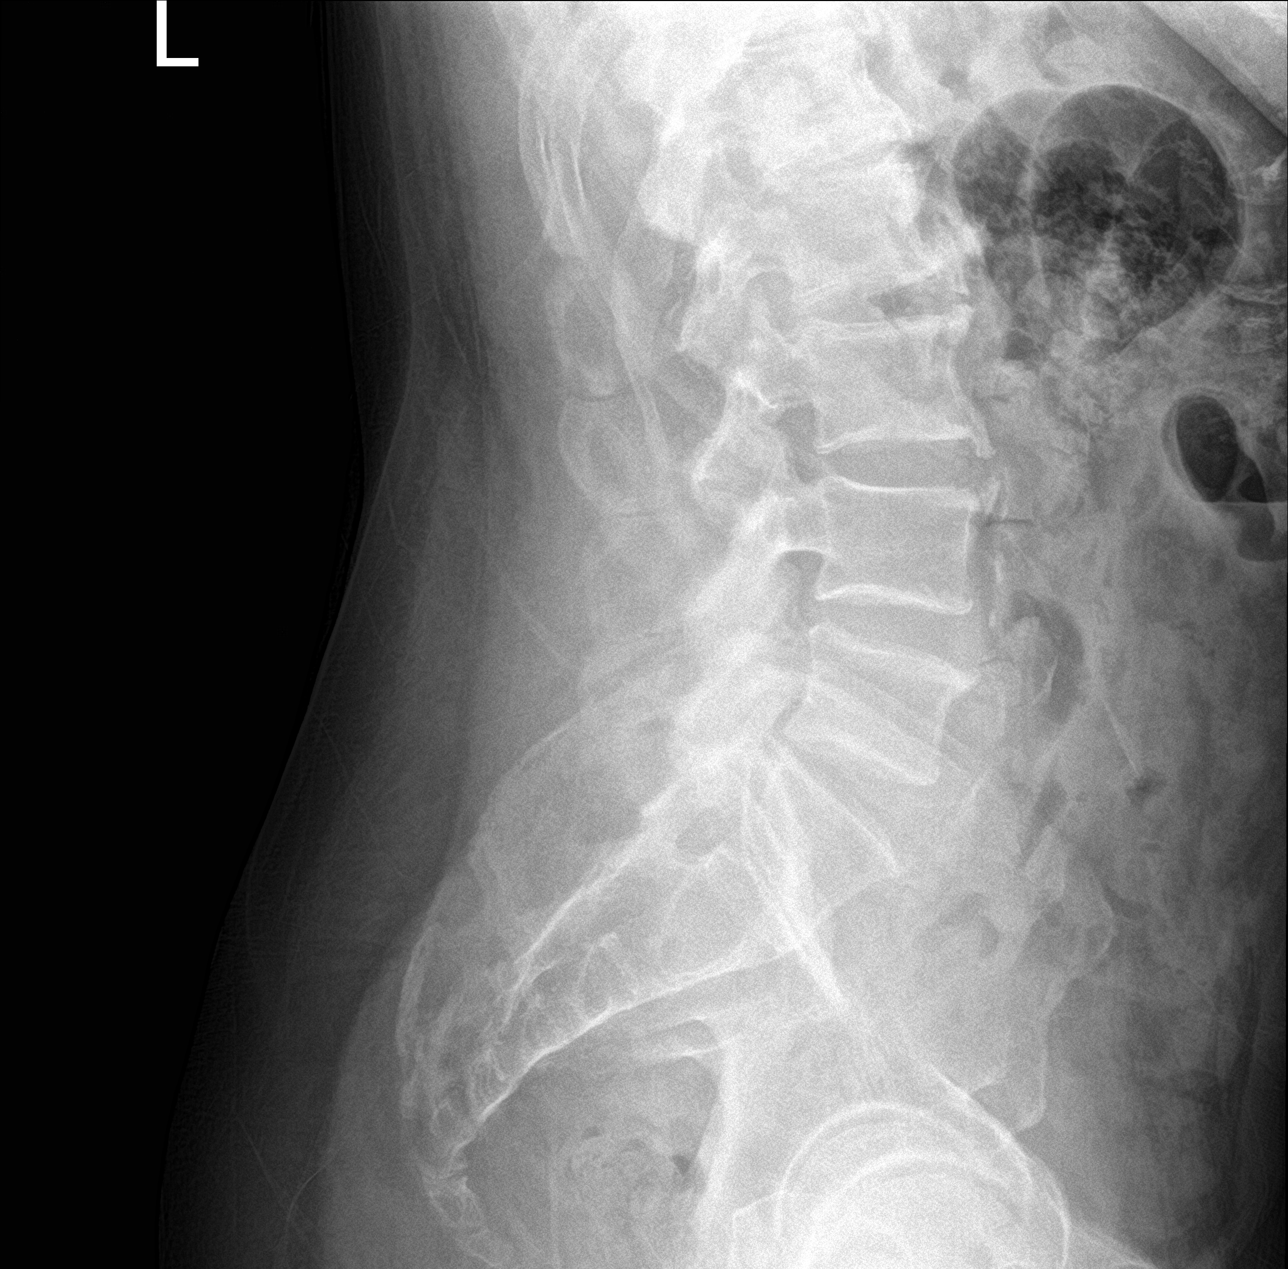

[l-spine lat (2 of 2)]
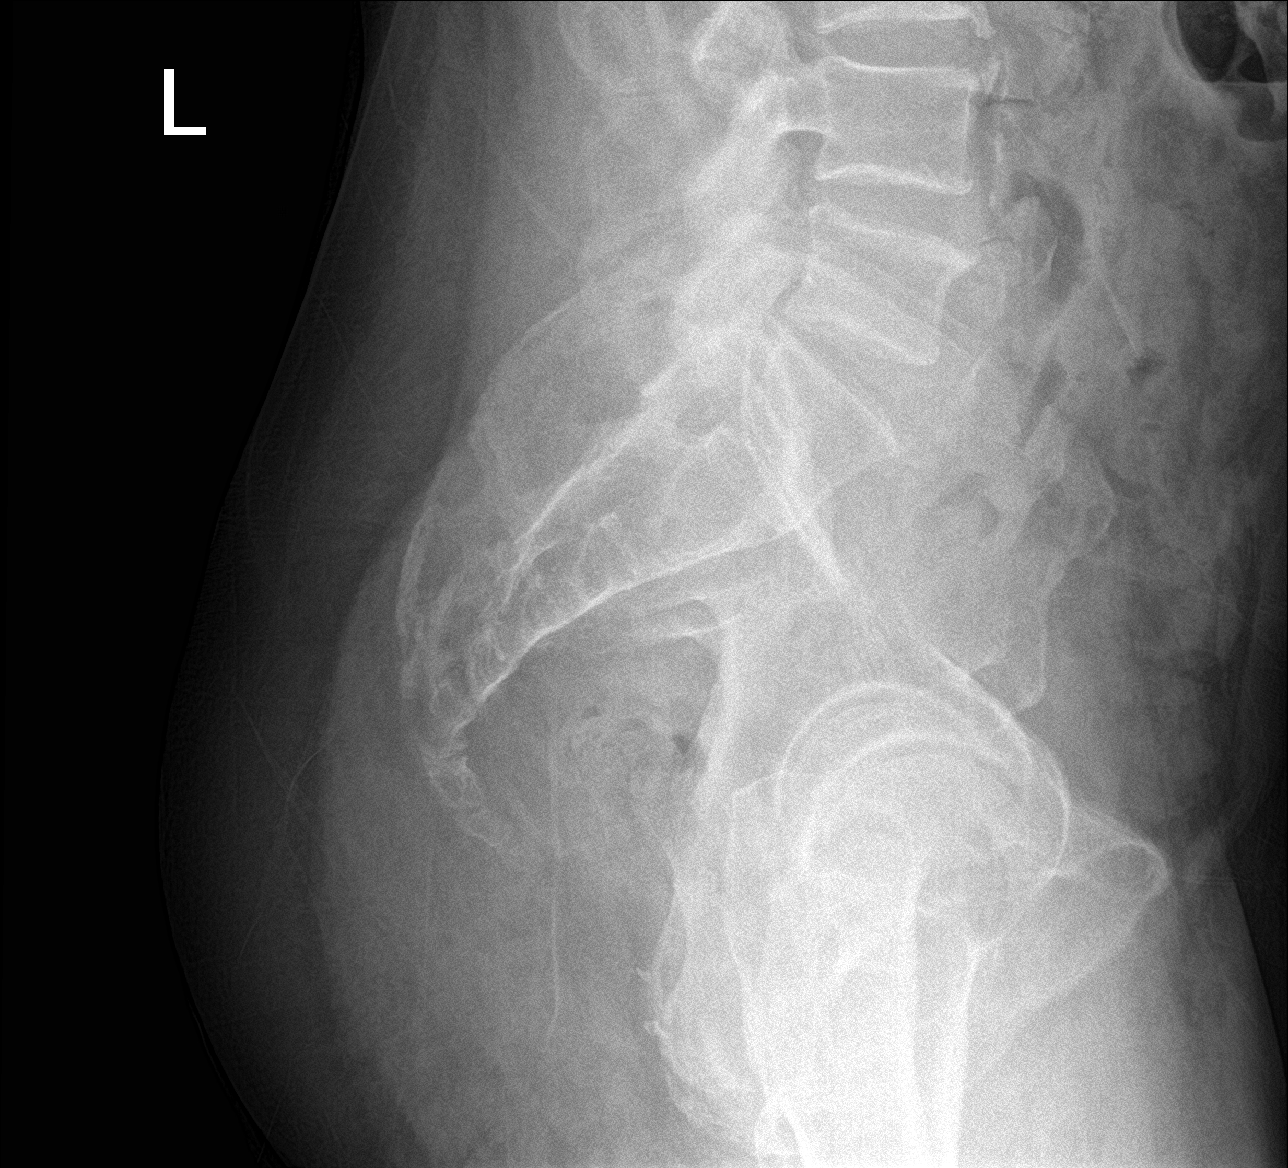

[5 of 5 positions shown; findings below may reference images not displayed]

FINDINGS: Using the same numbering terminology, there are 13 sets of ribs with
the lowest ribs described at L1. Alignment shows minimal scoliotic
curvature convex to the right. There is no significant disc space
narrowing. There is ordinary lower lumbar facet osteoarthritis. No
evidence of fracture or focal bone lesion.
IMPRESSION: First lumbar segment ribs, as numbered previously. No evidence of
regional fracture. Mild lower lumbar facet osteoarthritis.

## 2022-01-03 MED ORDER — LIDOCAINE 5 % EX PTCH: 1.0000 | MEDICATED_PATCH | CUTANEOUS | 0 refills | Status: DC

## 2022-01-03 MED ORDER — HYDROCODONE-ACETAMINOPHEN 5-325 MG PO TABS: 1.0000 | ORAL_TABLET | Freq: Three times a day (TID) | ORAL | 0 refills | Status: DC | PRN

## 2022-01-03 NOTE — Discharge Instructions (Signed)
Your x-ray showed some arthritis but no other abnormalities.  As we discussed[facility]  Continue your hydrocodone as scheduled.  I have called in a few extra doses for breakthrough pain.  I also recommend that you use lidocaine patches for pain relief.  If you develop any bowel/bladder incontinence, lower extremity weakness, going to the bathroom on yourself without noticing it or trouble going to the bathroom, numbness on the inside of your thighs you need to go to the emergency room immediately.

## 2022-01-03 NOTE — ED Triage Notes (Signed)
Pt c/o lower back pain x1 month, now radiating down both legs to his knees x2wk. Denies injury, states may have twisted wrong. Taking hydrocodone with relief.

## 2022-01-03 NOTE — ED Provider Notes (Signed)
[facility]    CSN: [id number] Arrival date & time: 01/03/22  1237      History   Chief Complaint Chief Complaint  Patient presents with   Back Pain   Knee Pain    HPI Randy Andersen is a 66 y.o. male.   Patient presents today with a several month history of gradually worsening lower back pain.  He denies any known injury or increase in activity prior to symptom onset.  He does report over the past several weeks the pain has increased and is now going down the back of his legs involving his knees.  Pain is rated 7 on a 0-10 pain scale, described as intense aching with periodic sharp pains, no aggravating or alleviating factors identified.  He has tried hydrocodone as prescribed with only temporary relief of symptoms.  He denies saddle anesthesia or lower extremity weakness.  He does report 1 episode of urinary incontinence several weeks ago but has not had any recurrent bowel/bladder incontinence or retention.  He does have a history of active malignancy with metastases.    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   Single subsegmental pulmonary embolism without acute cor pulmonale ([place]) 08/25/2020   Encounter for antineoplastic chemotherapy 04/14/2020   Encounter for antineoplastic immunotherapy 04/14/2020   Goals of care, counseling/discussion 03/31/2020   Tobacco use 03/17/2020   Pulmonary nodule 02/19/2020   Malignant neoplasm of upper lobe of right lung ([place]) 02/19/2020   Fracture of tibial plateau, closed 10/07/2012   Fracture of clavicle, left, closed 10/07/2012   Chronic pain disorder     Past Surgical  History:  Procedure Laterality Date   BUNIONECTOMY Left    had hammer toe surgery also and callus removed   BUNIONECTOMY Right    also had hammer toe surgery and callus removed   LEG SURGERY Left    PT reports he has pins placed in his Lt leg   ORIF TIBIA PLATEAU  10/09/2012   Dr Lorin Mercy   ORIF TIBIA PLATEAU Left 10/08/2012   Procedure: OPEN REDUCTION INTERNAL FIXATION (ORIF) TIBIAL PLATEAU;  Surgeon: Marybelle Killings, MD;  Location: [facility];  Service: Orthopedics;  Laterality: Left;   VIDEO BRONCHOSCOPY WITH ENDOBRONCHIAL NAVIGATION N/A 02/25/2020   Procedure: VIDEO BRONCHOSCOPY WITH ENDOBRONCHIAL NAVIGATION with biopsies;  Surgeon: Collene Gobble, MD;  Location: [facility] OR;  Service: Thoracic;  Laterality: N/A;   VIDEO BRONCHOSCOPY WITH ENDOBRONCHIAL ULTRASOUND N/A 02/25/2020   Procedure: VIDEO BRONCHOSCOPY WITH ENDOBRONCHIAL ULTRASOUND;  Surgeon: Collene Gobble, MD;  Location: [facility] OR;  Service: Thoracic;  Laterality: N/A;       Home Medications    Prior to Admission medications   Medication Sig Start Date End Date Taking? Authorizing Provider  HYDROcodone-acetaminophen (NORCO/VICODIN) 5-325 MG tablet Take 1 tablet by mouth every 8 (eight) hours as needed. 01/03/22  Yes Shayra Anton K, PA-C  lidocaine (LIDODERM) 5 % Place 1 patch onto the skin daily. Remove & Discard patch within 12 hours or as directed  by MD 01/03/22  Yes Sharifah Champine, Derry Skill, PA-C  amLODipine (NORVASC) 10 MG tablet Take 10 mg by mouth daily.    [provider]  Cholecalciferol 100 MCG (4000 UT) TABS Take 2 tablets by mouth daily. 09/20/20   [provider]  dexamethasone (DECADRON) 1 MG tablet Take 1 tablet (1 mg total) by mouth daily. 12/29/21   Ventura Sellers, MD  diclofenac Sodium (VOLTAREN) 1 % GEL Apply 4 g topically 3 (three) times daily. 08/03/21   [provider]  doxylamine, Sleep, (UNISOM) 25 MG tablet Take 1 tablet (25 mg total) by mouth at bedtime as needed for sleep. 08/31/21   Heilingoetter, Cassandra  L, PA-C  HYDROcodone-acetaminophen (NORCO) 10-325 MG tablet Take 1 tablet by mouth 2 (two) times daily.    [provider]  oxybutynin (DITROPAN-XL) 10 MG 24 hr tablet Take by mouth. 06/14/20   [provider]  polyethylene glycol powder (GLYCOLAX/MIRALAX) 17 GM/SCOOP powder  07/19/20   [provider]  potassium chloride SA (KLOR-CON M) 20 MEQ tablet Take 1 tablet (20 mEq total) by mouth daily. 10/31/21   Heilingoetter, Cassandra L, PA-C  prochlorperazine (COMPAZINE) 10 MG tablet Take 1 tablet (10 mg total) by mouth every 6 (six) hours as needed. 08/26/20   Heilingoetter, Cassandra L, PA-C  rivaroxaban (XARELTO) 20 MG TABS tablet TAKE 1 TABLET BY MOUTH ONCE A DAY WITH SUPPER 09/30/20 09/30/21  Heilingoetter, Cassandra L, PA-C  senna-docusate (SENOKOT-S) 8.6-50 MG tablet Take 2 tablets by mouth daily. 11/01/20   [provider]  tadalafil (CIALIS) 20 MG tablet Take by mouth. 06/14/20   [provider]  tamsulosin (FLOMAX) 0.4 MG CAPS capsule Take 1 capsule (0.4 mg total) by mouth daily. 06/01/20   LampteyMyrene Galas, MD    Family History Family History  Problem Relation Age of Onset   Cancer Mother        Intestinal metastatic to ovaries    Social History Social History   Tobacco Use   Smoking status: Former    Packs/day: 2.00    Years: 28.00    Total pack years: 56.00    Types: Cigarettes   Smokeless tobacco: Never  Vaping Use   Vaping Use: Never used  Substance Use Topics   Alcohol use: No   Drug use: No     Allergies   Patient has no known allergies.   Review of Systems Review of Systems  Constitutional:  Positive for activity change. Negative for appetite change, fatigue and fever.  Respiratory:  Negative for cough and shortness of breath.   Cardiovascular:  Negative for chest pain.  Gastrointestinal:  Negative for abdominal pain, diarrhea, nausea and vomiting.  Musculoskeletal:  Positive for arthralgias and back pain. Negative  for myalgias.  Neurological:  Negative for dizziness, weakness, light-headedness, numbness and headaches.     Physical Exam Triage Vital Signs ED Triage Vitals  Enc Vitals Group     BP 01/03/22 1311 (!) 142/94     Pulse Rate 01/03/22 1311 77     Resp 01/03/22 1311 18     Temp 01/03/22 1311 98.2 F (36.8 C)     Temp Source 01/03/22 1311 Oral     SpO2 01/03/22 1311 99 %     Weight --      Height --      Head Circumference --      Peak Flow --      Pain Score 01/03/22 1312 7     Pain Loc --  Pain Edu? --      Excl. in [place]? --    No data found.  Updated Vital Signs BP (!) 142/94 (BP Location: Left Arm)   Pulse 77   Temp 98.2 F (36.8 C) (Oral)   Resp 18   SpO2 99%   Visual Acuity Right Eye Distance:   Left Eye Distance:   Bilateral Distance:    Right Eye Near:   Left Eye Near:    Bilateral Near:     Physical Exam Vitals reviewed.  Constitutional:      General: He is awake.     Appearance: Normal appearance. He is well-developed. He is not ill-appearing.     Comments: Very pleasant male appears stated age in no acute distress sitting comfortably in exam room  HENT:     Head: Normocephalic and atraumatic.     Mouth/Throat:     Pharynx: Uvula midline. No oropharyngeal exudate or posterior oropharyngeal erythema.  Cardiovascular:     Rate and Rhythm: Normal rate and regular rhythm.     Heart sounds: Normal heart sounds, S1 normal and S2 normal. No murmur heard. Pulmonary:     Effort: Pulmonary effort is normal.     Breath sounds: Normal breath sounds. No stridor. No wheezing, rhonchi or rales.     Comments: Clear to auscultation bilaterally Abdominal:     General: Bowel sounds are normal.     Palpations: Abdomen is soft.     Tenderness: There is no abdominal tenderness.  Musculoskeletal:     Cervical back: No tenderness or bony tenderness.     Thoracic back: No tenderness or bony tenderness.     Lumbar back: Tenderness and bony tenderness present.  Positive right straight leg raise test and positive left straight leg raise test.  Neurological:     Mental Status: He is alert.  Psychiatric:        Behavior: Behavior is cooperative.      UC Treatments / Results  Labs (all labs ordered are listed, but only abnormal results are displayed) Labs Reviewed - No data to display  EKG   Radiology DG Lumbar Spine Complete  Result Date: 01/03/2022 CLINICAL DATA:  Worsening lumbar region pain in the setting of malignancy. Pain extends to both legs. EXAM: LUMBAR SPINE - COMPLETE 4+ VIEW COMPARISON:  04/03/2017 FINDINGS: Using the same numbering terminology, there are 13 sets of ribs with the lowest ribs described at L1. Alignment shows minimal scoliotic curvature convex to the right. There is no significant disc space narrowing. There is ordinary lower lumbar facet osteoarthritis. No evidence of fracture or focal bone lesion. IMPRESSION: First lumbar segment ribs, as numbered previously. No evidence of regional fracture. Mild lower lumbar facet osteoarthritis. Electronically Signed   By: Nelson Chimes M.D.   On: 01/03/2022 13:50    Procedures Procedures (including critical care time)  Medications Ordered in UC Medications - No data to display  Initial Impression / Assessment and Plan / UC Course  I have reviewed the triage vital signs and the nursing notes.  Pertinent labs & imaging results that were available during my care of the patient were reviewed by me and considered in my medical decision making (see chart for details).     X-ray obtained given history of malignancy with worsening lower back pain showed no acute osseous abnormality but did show some degenerative changes.  Discussed with patient that episode of bladder incontinence is concerning, however, he has not had any additional episodes and believes  it was coincidental.  Patient declined emergency room evaluation for MRI I discussed that this is reasonable as long as he does not  have any recurrent episodes.  Did request rectal exam during visit to monitor sphincter tone but patient declined this.  Recommended that he follow-up with his primary care provider soon as possible as he may benefit from an MRI for further investigation of symptoms.  He is limited in medications he can take for pain and is already on hydrocodone.  He was given 5 doses of hydrocodone 5/325 for breakthrough pain and encouraged him to follow-up with his PCP as soon as possible for ongoing pain management.  He was also given lidocaine patches with instruction on how to properly use these.  He can use heat, rest, stretch for additional symptom relief.  Discussed that if he has any worsening symptoms including increased pain, bowel/bladder incontinence or urinary/bowel retention, saddle anesthesia, lower extremity weakness, fever he needs to go to the emergency room immediately for further evaluation to which he expressed understanding.  Final Clinical Impressions(s) / UC Diagnoses   Final diagnoses:  Acute midline low back pain with bilateral sciatica     Discharge Instructions      Your x-ray showed some arthritis but no other abnormalities.  As we discussed, [facility]  Continue your hydrocodone as scheduled.  I have called in a few extra doses for breakthrough pain.  I also recommend that you use lidocaine patches for pain relief.  If you develop any bowel/bladder incontinence, lower extremity weakness, going to the bathroom on yourself without noticing it or trouble going to the bathroom, numbness on the inside of your thighs you need to go to the emergency room immediately.     ED Prescriptions     Medication Sig Dispense Auth. Provider   lidocaine (LIDODERM) 5 % Place 1 patch onto the skin daily. Remove & Discard patch within 12 hours or as directed by MD 15 patch Bre Pecina K, PA-C   HYDROcodone-acetaminophen (NORCO/VICODIN) 5-325 MG tablet Take 1  tablet by mouth every 8 (eight) hours as needed. 5 tablet Magaline Steinberg K, PA-C      I have reviewed the PDMP during this encounter.   Terrilee Croak, PA-C 01/03/22 1409

## 2022-01-05 ENCOUNTER — Inpatient Hospital Stay: Payer: No Typology Code available for payment source

## 2022-01-05 ENCOUNTER — Other Ambulatory Visit: Payer: Self-pay

## 2022-01-05 ENCOUNTER — Inpatient Hospital Stay (HOSPITAL_BASED_OUTPATIENT_CLINIC_OR_DEPARTMENT_OTHER): Payer: No Typology Code available for payment source | Admitting: Physician Assistant

## 2022-01-05 ENCOUNTER — Encounter: Payer: Self-pay | Admitting: Nutrition

## 2022-01-05 VITALS — BP 150/82 | HR 81 | Temp 98.1°F | Resp 20 | Wt 184.7 lb

## 2022-01-05 DIAGNOSIS — C3411 Malignant neoplasm of upper lobe, right bronchus or lung: Secondary | ICD-10-CM

## 2022-01-05 DIAGNOSIS — Z5112 Encounter for antineoplastic immunotherapy: Secondary | ICD-10-CM | POA: Diagnosis not present

## 2022-01-05 LAB — CBC WITH DIFFERENTIAL (CANCER CENTER ONLY)
Abs Immature Granulocytes: 0.02 10*3/uL (ref 0.00–0.07)
Basophils Absolute: 0 10*3/uL (ref 0.0–0.1)
Basophils Relative: 1 %
Eosinophils Absolute: 0.1 10*3/uL (ref 0.0–0.5)
Eosinophils Relative: 1 %
HCT: 38.9 % — ABNORMAL LOW (ref 39.0–52.0)
Hemoglobin: 13.2 g/dL (ref 13.0–17.0)
Immature Granulocytes: 1 %
Lymphocytes Relative: 26 %
Lymphs Abs: 1.1 10*3/uL (ref 0.7–4.0)
MCH: 29.3 pg (ref 26.0–34.0)
MCHC: 33.9 g/dL (ref 30.0–36.0)
MCV: 86.3 fL (ref 80.0–100.0)
Monocytes Absolute: 0.6 10*3/uL (ref 0.1–1.0)
Monocytes Relative: 14 %
Neutro Abs: 2.5 10*3/uL (ref 1.7–7.7)
Neutrophils Relative %: 57 %
Platelet Count: 222 10*3/uL (ref 150–400)
RBC: 4.51 MIL/uL (ref 4.22–5.81)
RDW: 16.5 % — ABNORMAL HIGH (ref 11.5–15.5)
WBC Count: 4.3 10*3/uL (ref 4.0–10.5)
nRBC: 0 % (ref 0.0–0.2)

## 2022-01-05 LAB — CMP (CANCER CENTER ONLY)
ALT: 10 U/L (ref 0–44)
AST: 11 U/L — ABNORMAL LOW (ref 15–41)
Albumin: 3.8 g/dL (ref 3.5–5.0)
Alkaline Phosphatase: 114 U/L (ref 38–126)
Anion gap: 5 (ref 5–15)
BUN: 16 mg/dL (ref 8–23)
CO2: 25 mmol/L (ref 22–32)
Calcium: 9.6 mg/dL (ref 8.9–10.3)
Chloride: 109 mmol/L (ref 98–111)
Creatinine: 1.51 mg/dL — ABNORMAL HIGH (ref 0.61–1.24)
GFR, Estimated: 50 mL/min — ABNORMAL LOW (ref >60)
Glucose, Bld: 104 mg/dL — ABNORMAL HIGH (ref 70–99)
Potassium: 4 mmol/L (ref 3.5–5.1)
Sodium: 139 mmol/L (ref 135–145)
Total Bilirubin: 0.3 mg/dL (ref 0.3–1.2)
Total Protein: 7.4 g/dL (ref 6.5–8.1)

## 2022-01-05 MED ORDER — SODIUM CHLORIDE 0.9 % IV SOLN
200.0000 mg | Freq: Once | INTRAVENOUS | Status: AC
  Administered 2022-01-05: 200 mg via INTRAVENOUS
  Filled 2022-01-05: qty 200

## 2022-01-05 MED ORDER — SODIUM CHLORIDE 0.9 % IV SOLN: Freq: Once | INTRAVENOUS | Status: AC

## 2022-01-05 NOTE — Progress Notes (Signed)
Okay to treat with SCr 1.51 per C. Heilengoetter, PA

## 2022-01-05 NOTE — Patient Instructions (Signed)
[facility]COL[facility]  Discharge Instructions: Thank you for choosing [facility] to provide your oncology and hematology care.   If you have a lab appointment with the [facility], please go directly to the [facility] and check in at the registration area.   Wear comfortable clothing and clothing appropriate for easy access to any Portacath or PICC line.   We strive to give you quality time with your provider. You may need to reschedule your appointment if you arrive late (15 or more minutes).  Arriving late affects you and other patients whose appointments are after yours.  Also, if you miss three or more appointments without notifying the office, you may be dismissed from the clinic at the provider's discretion.      For prescription refill requests, have your pharmacy contact our office and allow 72 hours for refills to be completed.    Today you received the following chemotherapy and/or immunotherapy agents Randy Andersen      To help prevent nausea and vomiting after your treatment, we encourage you to take your nausea medication as directed.  BELOW ARE SYMPTOMS THAT SHOULD BE REPORTED IMMEDIATELY: *FEVER GREATER THAN 100.4 F (38 C) OR HIGHER *CHILLS OR SWEATING *NAUSEA AND VOMITING THAT IS NOT CONTROLLED WITH YOUR NAUSEA MEDICATION *UNUSUAL SHORTNESS OF BREATH *UNUSUAL BRUISING OR BLEEDING *URINARY PROBLEMS (pain or burning when urinating, or frequent urination) *BOWEL PROBLEMS (unusual diarrhea, constipation, pain near the anus) TENDERNESS IN MOUTH AND THROAT WITH OR WITHOUT PRESENCE OF ULCERS (sore throat, sores in mouth, or a toothache) UNUSUAL RASH, SWELLING OR PAIN  UNUSUAL VAGINAL DISCHARGE OR ITCHING   Items with * indicate a potential emergency and should be followed up as soon as possible or go to the Emergency Department if any problems should occur.  Please show the CHEMOTHERAPY ALERT CARD or IMMUNOTHERAPY ALERT CARD at check-in to  the Emergency Department and triage nurse.  Should you have questions after your visit or need to cancel or reschedule your appointment, please contact [facility]  Dept: [phone]  and follow the prompts.  Office hours are 8:00 a.m. to 4:30 p.m. Monday - Friday. Please note that voicemails left after 4:00 p.m. may not be returned until the following business day.  We are closed weekends and major holidays. You have access to a nurse at all times for urgent questions. Please call the main number to the clinic Dept: [phone] and follow the prompts.   For any non-urgent questions, you may also contact your provider using MyChart. We now offer e-Visits for anyone 55 and older to request care online for non-urgent symptoms. For details visit [email or web address].   Also download the MyChart app! Go to the app store, search "MyChart", open the app, select [facility], and log in with your MyChart username and password.  Due to Covid, a mask is required upon entering the hospital/clinic. If you do not have a mask, one will be given to you upon arrival. For doctor visits, patients may have 1 support person aged 76 or older with them. For treatment visits, patients cannot have anyone with them due to current Covid guidelines and our immunocompromised population.

## 2022-01-05 NOTE — Progress Notes (Signed)
Provided one complimentary case of ensure plus high protein, coupons and a food bag from the food pantry.

## 2022-01-06 LAB — TSH: TSH: 1.446 u[IU]/mL (ref 0.350–4.500)

## 2022-01-20 ENCOUNTER — Ambulatory Visit (HOSPITAL_COMMUNITY): Payer: Medicare PPO

## 2022-01-23 ENCOUNTER — Inpatient Hospital Stay: Payer: No Typology Code available for payment source | Admitting: Internal Medicine

## 2022-01-23 ENCOUNTER — Telehealth: Payer: Self-pay | Admitting: Internal Medicine

## 2022-01-23 ENCOUNTER — Inpatient Hospital Stay: Payer: No Typology Code available for payment source

## 2022-01-23 ENCOUNTER — Encounter: Payer: Self-pay | Admitting: Licensed Clinical Social Worker

## 2022-01-23 DIAGNOSIS — C3411 Malignant neoplasm of upper lobe, right bronchus or lung: Secondary | ICD-10-CM

## 2022-01-23 NOTE — Telephone Encounter (Signed)
.  Called patient to schedule appointment per 7/3 inbasket, patient is aware of date and time.

## 2022-01-25 ENCOUNTER — Encounter: Payer: Self-pay | Admitting: Internal Medicine

## 2022-01-25 NOTE — Progress Notes (Signed)
[facility] CSW Progress Note  Clinical Education officer, museum contacted patient by phone to discuss concerns regarding transportation.  Pt states he is unable to get to medical appointments as his car has broken down.  CSW contacted transportation who are scheduling transport to and from cancer center appointment.  Pt advised to contact [facility] transportation as he should be eligible for transport w/ his [facility] benefits for transportation to and from those appointments.  CSW emailed contact information for local organizations who may be able to offer financial assistance to help with the repair of pt's vehicle.  CSW to remain available to address any additional needs which may arise.      Henriette Combs, LCSW

## 2022-01-27 ENCOUNTER — Telehealth: Payer: Self-pay | Admitting: *Deleted

## 2022-01-27 ENCOUNTER — Ambulatory Visit (HOSPITAL_COMMUNITY)
Admission: RE | Admit: 2022-01-27 | Discharge: 2022-01-27 | Disposition: A | Discharge status: Home / Self Care | Payer: Medicare PPO | Source: Ambulatory Visit | Attending: Physician Assistant | Admitting: Physician Assistant

## 2022-01-27 ENCOUNTER — Inpatient Hospital Stay: Payer: No Typology Code available for payment source | Attending: Internal Medicine

## 2022-01-27 DIAGNOSIS — Z79899 Other long term (current) drug therapy: Secondary | ICD-10-CM | POA: Insufficient documentation

## 2022-01-27 DIAGNOSIS — C7931 Secondary malignant neoplasm of brain: Secondary | ICD-10-CM | POA: Insufficient documentation

## 2022-01-27 DIAGNOSIS — C3412 Malignant neoplasm of upper lobe, left bronchus or lung: Secondary | ICD-10-CM | POA: Insufficient documentation

## 2022-01-27 DIAGNOSIS — Z5112 Encounter for antineoplastic immunotherapy: Secondary | ICD-10-CM | POA: Insufficient documentation

## 2022-01-27 NOTE — Telephone Encounter (Signed)
Patient called to ask if he needed to drink contrast for CT at [facility] this afternoon. Advised patient that per instructions given to him from CT and noted in appt notes: "arrive @ 330 @ [facility] NPO 4hrs prior drink contrast @ 2:00 and 3:00" Patient verbalized understanding of information

## 2022-01-30 ENCOUNTER — Telehealth: Payer: Self-pay

## 2022-01-30 ENCOUNTER — Inpatient Hospital Stay: Payer: No Typology Code available for payment source

## 2022-01-30 ENCOUNTER — Inpatient Hospital Stay: Payer: No Typology Code available for payment source | Admitting: Internal Medicine

## 2022-01-30 NOTE — Telephone Encounter (Signed)
Pt ws a no show for apt today. Called pt and he stated he did not have transportation. I reminded him that our LCSW Manuela Schwartz had provided information on available transportation resources. Patient states that no one had contacted him to schedule ride for apt today. Will reach out to transportation coordinator.

## 2022-02-08 ENCOUNTER — Encounter: Payer: Self-pay | Admitting: Licensed Clinical Social Worker

## 2022-02-08 ENCOUNTER — Encounter: Payer: Self-pay | Admitting: Dietician

## 2022-02-08 ENCOUNTER — Ambulatory Visit (HOSPITAL_COMMUNITY)
Admission: RE | Admit: 2022-02-08 | Discharge: 2022-02-08 | Disposition: A | Discharge status: Home / Self Care | Payer: Medicare PPO | Source: Ambulatory Visit | Attending: Physician Assistant | Admitting: Physician Assistant

## 2022-02-08 DIAGNOSIS — C3411 Malignant neoplasm of upper lobe, right bronchus or lung: Secondary | ICD-10-CM | POA: Insufficient documentation

## 2022-02-08 DIAGNOSIS — C7931 Secondary malignant neoplasm of brain: Secondary | ICD-10-CM | POA: Diagnosis not present

## 2022-02-08 DIAGNOSIS — J984 Other disorders of lung: Secondary | ICD-10-CM | POA: Diagnosis not present

## 2022-02-08 DIAGNOSIS — J9809 Other diseases of bronchus, not elsewhere classified: Secondary | ICD-10-CM | POA: Diagnosis not present

## 2022-02-08 DIAGNOSIS — C3490 Malignant neoplasm of unspecified part of unspecified bronchus or lung: Secondary | ICD-10-CM | POA: Diagnosis not present

## 2022-02-08 NOTE — Progress Notes (Signed)
[facility] CSW Progress Note  Holiday representative met with patient to obtain signature for LCI application.  LCI application submitted on behalf of pt.  Pt also inquired about having his medical records sent to the [place] for a claim he has filed.  According to pt he has made multiple requests to have his records sent to the [place], as he has only 30 days to provide these records or his claim will be closed.  CSW forwarded pt's request and information to the practice administrator to follow up w/ pt.  CSW to remain available to assist pt w/ any needs which may arise throughout duration of treatment.    Henriette Combs, LCSW

## 2022-02-08 NOTE — Progress Notes (Signed)
Provided one complimentary case of Ensure Plus High Protein 

## 2022-02-13 ENCOUNTER — Other Ambulatory Visit: Payer: Self-pay

## 2022-02-13 ENCOUNTER — Inpatient Hospital Stay: Payer: No Typology Code available for payment source

## 2022-02-13 ENCOUNTER — Encounter: Payer: Self-pay | Admitting: Internal Medicine

## 2022-02-13 ENCOUNTER — Inpatient Hospital Stay (HOSPITAL_BASED_OUTPATIENT_CLINIC_OR_DEPARTMENT_OTHER): Payer: No Typology Code available for payment source | Admitting: Internal Medicine

## 2022-02-13 VITALS — BP 158/92 | HR 70 | Temp 98.0°F | Resp 18 | Ht 74.0 in | Wt 186.9 lb

## 2022-02-13 DIAGNOSIS — C3412 Malignant neoplasm of upper lobe, left bronchus or lung: Secondary | ICD-10-CM | POA: Diagnosis present

## 2022-02-13 DIAGNOSIS — C3411 Malignant neoplasm of upper lobe, right bronchus or lung: Secondary | ICD-10-CM

## 2022-02-13 DIAGNOSIS — Z5112 Encounter for antineoplastic immunotherapy: Secondary | ICD-10-CM | POA: Diagnosis present

## 2022-02-13 DIAGNOSIS — Z5111 Encounter for antineoplastic chemotherapy: Secondary | ICD-10-CM | POA: Diagnosis not present

## 2022-02-13 DIAGNOSIS — Z79899 Other long term (current) drug therapy: Secondary | ICD-10-CM | POA: Diagnosis not present

## 2022-02-13 DIAGNOSIS — C7931 Secondary malignant neoplasm of brain: Secondary | ICD-10-CM

## 2022-02-13 LAB — CBC WITH DIFFERENTIAL (CANCER CENTER ONLY)
Abs Immature Granulocytes: 0.02 10*3/uL (ref 0.00–0.07)
Basophils Absolute: 0.1 10*3/uL (ref 0.0–0.1)
Basophils Relative: 1 %
Eosinophils Absolute: 0.1 10*3/uL (ref 0.0–0.5)
Eosinophils Relative: 2 %
HCT: 43.3 % (ref 39.0–52.0)
Hemoglobin: 14.5 g/dL (ref 13.0–17.0)
Immature Granulocytes: 0 %
Lymphocytes Relative: 31 %
Lymphs Abs: 1.8 10*3/uL (ref 0.7–4.0)
MCH: 29 pg (ref 26.0–34.0)
MCHC: 33.5 g/dL (ref 30.0–36.0)
MCV: 86.6 fL (ref 80.0–100.0)
Monocytes Absolute: 0.6 10*3/uL (ref 0.1–1.0)
Monocytes Relative: 11 %
Neutro Abs: 3.2 10*3/uL (ref 1.7–7.7)
Neutrophils Relative %: 55 %
Platelet Count: 171 10*3/uL (ref 150–400)
RBC: 5 MIL/uL (ref 4.22–5.81)
RDW: 16.3 % — ABNORMAL HIGH (ref 11.5–15.5)
WBC Count: 5.8 10*3/uL (ref 4.0–10.5)
nRBC: 0 % (ref 0.0–0.2)

## 2022-02-13 LAB — CMP (CANCER CENTER ONLY)
ALT: 13 U/L (ref 0–44)
AST: 13 U/L — ABNORMAL LOW (ref 15–41)
Albumin: 4.1 g/dL (ref 3.5–5.0)
Alkaline Phosphatase: 82 U/L (ref 38–126)
Anion gap: 6 (ref 5–15)
BUN: 23 mg/dL (ref 8–23)
CO2: 27 mmol/L (ref 22–32)
Calcium: 10 mg/dL (ref 8.9–10.3)
Chloride: 106 mmol/L (ref 98–111)
Creatinine: 1.59 mg/dL — ABNORMAL HIGH (ref 0.61–1.24)
GFR, Estimated: 47 mL/min — ABNORMAL LOW (ref >60)
Glucose, Bld: 98 mg/dL (ref 70–99)
Potassium: 4 mmol/L (ref 3.5–5.1)
Sodium: 139 mmol/L (ref 135–145)
Total Bilirubin: 0.4 mg/dL (ref 0.3–1.2)
Total Protein: 7.2 g/dL (ref 6.5–8.1)

## 2022-02-13 LAB — TSH: TSH: 5.157 u[IU]/mL — ABNORMAL HIGH (ref 0.350–4.500)

## 2022-02-13 MED ORDER — SODIUM CHLORIDE 0.9 % IV SOLN: Freq: Once | INTRAVENOUS | Status: AC

## 2022-02-13 MED ORDER — SODIUM CHLORIDE 0.9 % IV SOLN
200.0000 mg | Freq: Once | INTRAVENOUS | Status: AC
  Administered 2022-02-13: 200 mg via INTRAVENOUS
  Filled 2022-02-13: qty 8

## 2022-02-13 NOTE — Progress Notes (Signed)
[facility] Telephone:(336) [phone]   Fax:(336) [place], [place] [address]  DIAGNOSIS: Stage IV non-small cell lung cancer, favored to be adenocarcinoma.  He presented with posterior right upper lobe nodule as well as right hilar, infrahilar, subcarinal, and right paratracheal lymphadenopathy.  He also has a 1.4 cm x 1 cm soft tissue nodule in the skin/subcutaneous fat in the left axilla.  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  He was diagnosed in August 2021   Molecular Biomarkers: BIOMARKER(S)         % CFDNA OR AMPLIFICATION       ASSOCIATED FDA-APPROVED THERAPIES        CLINICAL TRIAL AVAILABILITY [id number] 2.9% None    Yes [id number] 0.4% None    Yes [id number] 0.2% None    Yes   PRIOR THERAPY:  1) SRS to the small metastatic brain lesion under the care of Dr. [name] on 03/18/20 2) SBRT to the enlarging right upper lobe lung mass under the care of Dr. [name] completed on 04/06/2021.   CURRENT THERAPY:  1) Palliative systemic chemotherapy with carboplatin for an AUC of 5, Alimta 500 mg/m2, and Keytruda 200 mg IV every 3 weeks. First dose expected on 04/07/20.  Status post 28 cycles.  Starting from cycle #5 he will be on maintenance treatment with Alimta and Keytruda every 3 weeks.  For the last few cycles he is currently on maintenance treatment with single agent Keytruda. 2) SBRT to the enlarging right upper lobe lung mass under the care of Dr. [name].  INTERVAL HISTORY: Randy Andersen 66 y.o. male    MEDICAL HISTORY: Past Medical History:  Diagnosis Date   Asthma    as a child   Chronic pain disorder    LT leg and foot   Heart murmur    as a child   Hypertension    Malignant neoplasm of upper lobe of right lung ([facility])    Stroke ([facility])    mini stroke - 2015, some tingling in fingers on right     ALLERGIES:  has No Known Allergies.  MEDICATIONS:   Current Outpatient Medications  Medication Sig Dispense Refill   amLODipine (NORVASC) 10 MG tablet Take 10 mg by mouth daily.     Cholecalciferol 100 MCG (4000 UT) TABS Take 2 tablets by mouth daily.     dexamethasone (DECADRON) 1 MG tablet Take 1 tablet (1 mg total) by mouth daily. 60 tablet 1   diclofenac Sodium (VOLTAREN) 1 % GEL Apply 4 g topically 3 (three) times daily.     doxylamine, Sleep, (UNISOM) 25 MG tablet Take 1 tablet (25 mg total) by mouth at bedtime as needed for sleep. 30 tablet 0   HYDROcodone-acetaminophen (NORCO) 10-325 MG tablet Take 1 tablet by mouth 2 (two) times daily.     HYDROcodone-acetaminophen (NORCO/VICODIN) 5-325 MG tablet Take 1 tablet by mouth every 8 (eight) hours as needed. 5 tablet 0   lidocaine (LIDODERM) 5 % Place 1 patch onto the skin daily. Remove & Discard patch within 12 hours or as directed by MD 15 patch 0   oxybutynin (DITROPAN-XL) 10 MG 24 hr tablet Take by mouth.     polyethylene glycol powder (GLYCOLAX/MIRALAX) 17 GM/SCOOP powder      potassium chloride SA (KLOR-CON M) 20 MEQ tablet Take 1 tablet (20 mEq total) by mouth daily. 5 tablet 0  prochlorperazine (COMPAZINE) 10 MG tablet Take 1 tablet (10 mg total) by mouth every 6 (six) hours as needed. 30 tablet 2   rivaroxaban (XARELTO) 20 MG TABS tablet TAKE 1 TABLET BY MOUTH ONCE A DAY WITH SUPPER 30 tablet 2   senna-docusate (SENOKOT-S) 8.6-50 MG tablet Take 2 tablets by mouth daily.     tadalafil (CIALIS) 20 MG tablet Take by mouth.     tamsulosin (FLOMAX) 0.4 MG CAPS capsule Take 1 capsule (0.4 mg total) by mouth daily. 30 capsule 2   No current facility-administered medications for this visit.    SURGICAL HISTORY:  Past Surgical History:  Procedure Laterality Date   BUNIONECTOMY Left    had hammer toe surgery also and callus removed   BUNIONECTOMY Right    also had hammer toe surgery and callus removed   LEG SURGERY Left    PT reports he has pins placed in his Lt leg   ORIF TIBIA  PLATEAU  10/09/2012   Dr Lorin Mercy   ORIF TIBIA PLATEAU Left 10/08/2012   Procedure: OPEN REDUCTION INTERNAL FIXATION (ORIF) TIBIAL PLATEAU;  Surgeon: Marybelle Killings, MD;  Location: [facility];  Service: Orthopedics;  Laterality: Left;   VIDEO BRONCHOSCOPY WITH ENDOBRONCHIAL NAVIGATION N/A 02/25/2020   Procedure: VIDEO BRONCHOSCOPY WITH ENDOBRONCHIAL NAVIGATION with biopsies;  Surgeon: Collene Gobble, MD;  Location: [facility] OR;  Service: Thoracic;  Laterality: N/A;   VIDEO BRONCHOSCOPY WITH ENDOBRONCHIAL ULTRASOUND N/A 02/25/2020   Procedure: VIDEO BRONCHOSCOPY WITH ENDOBRONCHIAL ULTRASOUND;  Surgeon: Collene Gobble, MD;  Location: [facility] OR;  Service: Thoracic;  Laterality: N/A;    REVIEW OF SYSTEMS:  Constitutional: positive for fatigue and weight loss Eyes: negative Ears, nose, mouth, throat, and face: negative Respiratory: positive for cough Cardiovascular: negative Gastrointestinal: negative Genitourinary:negative Integument/breast: negative Hematologic/lymphatic: negative Musculoskeletal:negative Neurological: negative Behavioral/Psych: negative Endocrine: negative Allergic/Immunologic: negative   PHYSICAL EXAMINATION: General appearance: alert, cooperative, and no distress Head: Normocephalic, without obvious abnormality, atraumatic Neck: no adenopathy, no JVD, supple, symmetrical, trachea midline, and thyroid not enlarged, symmetric, no tenderness/mass/nodules Lymph nodes: Cervical, supraclavicular, and axillary nodes normal. Resp: clear to auscultation bilaterally Back: symmetric, no curvature. ROM normal. No CVA tenderness. Cardio: regular rate and rhythm, S1, S2 normal, no murmur, click, rub or gallop GI: soft, non-tender; bowel sounds normal; no masses,  no organomegaly Extremities: extremities normal, atraumatic, no cyanosis or edema Neurologic: Alert and oriented X 3, normal strength and tone. Normal symmetric reflexes. Normal coordination and gait  ECOG PERFORMANCE STATUS: 1 - Symptomatic  but completely ambulatory  There were no vitals taken for this visit.  LABORATORY DATA: Lab Results  Component Value Date   WBC 4.3 01/05/2022   HGB 13.2 01/05/2022   HCT 38.9 (L) 01/05/2022   MCV 86.3 01/05/2022   PLT 222 01/05/2022      Chemistry      Component Value Date/Time   NA 139 01/05/2022 1420   K 4.0 01/05/2022 1420   CL 109 01/05/2022 1420   CO2 25 01/05/2022 1420   BUN 16 01/05/2022 1420   CREATININE 1.51 (H) 01/05/2022 1420      Component Value Date/Time   CALCIUM 9.6 01/05/2022 1420   ALKPHOS 114 01/05/2022 1420   AST 11 (L) 01/05/2022 1420   ALT 10 01/05/2022 1420   BILITOT 0.3 01/05/2022 1420       RADIOGRAPHIC STUDIES: CT Chest Wo Contrast  Result Date: 02/09/2022 CLINICAL DATA:  Primary Cancer Type: Lung Imaging Indication: Assess response to therapy Interval therapy since  last imaging? Yes Initial Cancer Diagnosis Date: 02/25/2020; Established by: Biopsy-proven Detailed Pathology: Stage IV non-small cell lung cancer, favored to be adenocarcinoma. 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 Recurrence? Yes; Date(s) of recurrence: 02/08/2021; Established by: Imaging only Surgeries: No. Chemotherapy: Yes; Ongoing? No; Most recent administration: 11/10/2020 Immunotherapy?  Yes; Type: Keytruda; Ongoing? Yes Radiation therapy? Yes Date Range: 03/23/2021 - 04/06/2021; Target: Right lung Date Range: 03/18/2020; Target: Brain Other Cancer Therapies: Thyroid cancer. * Tracking Code: BO * EXAM: CT CHEST, ABDOMEN AND PELVIS WITHOUT CONTRAST TECHNIQUE: Multidetector CT imaging of the chest, abdomen and pelvis was performed following the standard protocol without IV contrast. RADIATION DOSE REDUCTION: This exam was performed according to the departmental dose-optimization program which includes automated exposure control, adjustment of the mA and/or kV according to patient size and/or use of iterative reconstruction technique. COMPARISON:  CT scans  including 11/17/2021 and 09/16/2021 FINDINGS: CT CHEST FINDINGS Cardiovascular: Coronary, aortic arch, and branch vessel atherosclerotic vascular disease. Mediastinum/Nodes: Mild rightward deviation of cardiac and mediastinal structures attributable to volume loss in the right lung. Lungs/Pleura: Substantial volume loss in previously consolidated regions of the right upper lobe and right lower lobe the of the surrounding ground-glass and interstitial opacities have resolved. Reduced size and density of the crescentic band of density in the superior segment right lower lobe on image 59 series 8. Continued truncation/occlusion in the posterior segmental bronchus of the right upper lobe (image 53, series 6). Continued hazy subpleural reticulation and faint ground-glass opacity inferiorly in the right middle lobe, no change from prior. Essentially stable 5 by 3 mm left upper lobe nodule on image 57 series 6. The subpleural ground-glass opacity previously shown anteriorly in the lingula has resolved. 6 by 2 mm right upper lobe nodule on image 47 series 6, previously 7 by 3 mm, likely benign. Musculoskeletal: Mild lower thoracic spondylosis. As noted previously there are 13 paired ribs, and only 4 lumbar type non-rib-bearing vertebra. CT ABDOMEN PELVIS FINDINGS Hepatobiliary: Unremarkable Pancreas: Unremarkable Spleen: Unremarkable Adrenals/Urinary Tract: Fluid density 1.1 cm lesion in the right kidney upper pole anteriorly, compatible with cyst. This does not require further workup. The adrenal glands appear normal. Urinary bladder unremarkable. No urinary tract calculi observed. Stomach/Bowel: Prominent stool throughout the colon favors constipation. Normal appendix. Vascular/Lymphatic: Atherosclerosis is present, including aortoiliac atherosclerotic disease. No pathologic adenopathy identified. Reproductive: Mild prostatomegaly with some left eccentricity of the prostate contour. Other: No supplemental non-categorized  findings. Musculoskeletal: Bridging spurring of both sacroiliac joints. As before, there is facet arthropathy causing right greater than left foraminal impingement at the L4-S1 level. IMPRESSION: 1. Increased volume loss and reduced airspace opacity and ground-glass opacity in the treated segments of the right upper lobe and superior segment right lower lobe. The improvement favors benign post therapy related findings. Continued truncation of the posterior segmental bronchus in the right upper lobe. 2. Stable single small upper lobe nodules, likely benign. 3. Other imaging findings of potential clinical significance: Aortic Atherosclerosis (ICD10-I70.0). Coronary atherosclerosis. Prominent stool throughout the colon favors constipation. Prostatomegaly with some left eccentric bulging of the prostate contour. Right greater than left foraminal impingement due to spurring at the L4-S1 level. Electronically Signed   By: Van Clines M.D.   On: 02/09/2022 11:03   CT Abdomen Pelvis Wo Contrast  Result Date: 02/09/2022 CLINICAL DATA:  Primary Cancer Type: Lung Imaging Indication: Assess response to therapy Interval therapy since last imaging? Yes Initial Cancer Diagnosis Date: 02/25/2020; Established by: Biopsy-proven Detailed Pathology: Stage  IV non-small cell lung cancer, favored to be adenocarcinoma. 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 Recurrence? Yes; Date(s) of recurrence: 02/08/2021; Established by: Imaging only Surgeries: No. Chemotherapy: Yes; Ongoing? No; Most recent administration: 11/10/2020 Immunotherapy?  Yes; Type: Keytruda; Ongoing? Yes Radiation therapy? Yes Date Range: 03/23/2021 - 04/06/2021; Target: Right lung Date Range: 03/18/2020; Target: Brain Other Cancer Therapies: Thyroid cancer. * Tracking Code: BO * EXAM: CT CHEST, ABDOMEN AND PELVIS WITHOUT CONTRAST TECHNIQUE: Multidetector CT imaging of the chest, abdomen and pelvis was performed following the standard  protocol without IV contrast. RADIATION DOSE REDUCTION: This exam was performed according to the departmental dose-optimization program which includes automated exposure control, adjustment of the mA and/or kV according to patient size and/or use of iterative reconstruction technique. COMPARISON:  CT scans including 11/17/2021 and 09/16/2021 FINDINGS: CT CHEST FINDINGS Cardiovascular: Coronary, aortic arch, and branch vessel atherosclerotic vascular disease. Mediastinum/Nodes: Mild rightward deviation of cardiac and mediastinal structures attributable to volume loss in the right lung. Lungs/Pleura: Substantial volume loss in previously consolidated regions of the right upper lobe and right lower lobe the of the surrounding ground-glass and interstitial opacities have resolved. Reduced size and density of the crescentic band of density in the superior segment right lower lobe on image 59 series 8. Continued truncation/occlusion in the posterior segmental bronchus of the right upper lobe (image 53, series 6). Continued hazy subpleural reticulation and faint ground-glass opacity inferiorly in the right middle lobe, no change from prior. Essentially stable 5 by 3 mm left upper lobe nodule on image 57 series 6. The subpleural ground-glass opacity previously shown anteriorly in the lingula has resolved. 6 by 2 mm right upper lobe nodule on image 47 series 6, previously 7 by 3 mm, likely benign. Musculoskeletal: Mild lower thoracic spondylosis. As noted previously there are 13 paired ribs, and only 4 lumbar type non-rib-bearing vertebra. CT ABDOMEN PELVIS FINDINGS Hepatobiliary: Unremarkable Pancreas: Unremarkable Spleen: Unremarkable Adrenals/Urinary Tract: Fluid density 1.1 cm lesion in the right kidney upper pole anteriorly, compatible with cyst. This does not require further workup. The adrenal glands appear normal. Urinary bladder unremarkable. No urinary tract calculi observed. Stomach/Bowel: Prominent stool  throughout the colon favors constipation. Normal appendix. Vascular/Lymphatic: Atherosclerosis is present, including aortoiliac atherosclerotic disease. No pathologic adenopathy identified. Reproductive: Mild prostatomegaly with some left eccentricity of the prostate contour. Other: No supplemental non-categorized findings. Musculoskeletal: Bridging spurring of both sacroiliac joints. As before, there is facet arthropathy causing right greater than left foraminal impingement at the L4-S1 level. IMPRESSION: 1. Increased volume loss and reduced airspace opacity and ground-glass opacity in the treated segments of the right upper lobe and superior segment right lower lobe. The improvement favors benign post therapy related findings. Continued truncation of the posterior segmental bronchus in the right upper lobe. 2. Stable single small upper lobe nodules, likely benign. 3. Other imaging findings of potential clinical significance: Aortic Atherosclerosis (ICD10-I70.0). Coronary atherosclerosis. Prominent stool throughout the colon favors constipation. Prostatomegaly with some left eccentric bulging of the prostate contour. Right greater than left foraminal impingement due to spurring at the L4-S1 level. Electronically Signed   By: Van Clines M.D.   On: 02/09/2022 11:03     ASSESSMENT AND PLAN: This is a very pleasant 66 years old African-American male diagnosed with stage IV non-small cell lung cancer, adenocarcinoma presented with right upper lobe nodule in addition to right hilar, infrahilar, subcarinal and right paratracheal adenopathy in addition to subcutaneous nodules in the left axilla and 2 small  metastatic brain lesion diagnosed in August 2021.  The patient has no actionable mutations. He is currently undergoing systemic chemotherapy with carboplatin for AUC of 5, Alimta 500 mg/M2 and Keytruda 200 mg IV every 3 weeks.  Status post 31 cycles.  Starting from cycle #5 he will be on maintenance treatment  with Alimta and Keytruda every 3 weeks.  I reduce the dose of his Alimta to 400 mg/M2 secondary to the renal insufficiency. The patient is currently on treatment with single agent Keytruda and Alimta was discontinued secondary to worsening renal insufficiency. He also underwent SBRT to the enlarging right upper lobe lung mass under the care of Dr. [name] completed in September 2022.  For the cough the patient will continue on Delsym over-the-counter on as-needed basis.  The patient voices understanding of current disease status and treatment options and is in agreement with the current care plan.  All questions were answered. The patient knows to call the clinic with any problems, questions or concerns. We can certainly see the patient much sooner if necessary.   Disclaimer: This note was dictated with voice recognition software. Similar sounding words can inadvertently be transcribed and may not be corrected upon review.

## 2022-02-13 NOTE — Patient Instructions (Signed)
[facility]  Discharge Instructions: Thank you for choosing [facility] to provide your oncology and hematology care.   If you have a lab appointment with the [facility], please go directly to the [facility] and check in at the registration area.   Wear comfortable clothing and clothing appropriate for easy access to any Portacath or PICC line.   We strive to give you quality time with your provider. You may need to reschedule your appointment if you arrive late (15 or more minutes).  Arriving late affects you and other patients whose appointments are after yours.  Also, if you miss three or more appointments without notifying the office, you may be dismissed from the clinic at the provider's discretion.      For prescription refill requests, have your pharmacy contact our office and allow 72 hours for refills to be completed.    Today you received the following chemotherapy and/or immunotherapy agents: pembrolizumab      To help prevent nausea and vomiting after your treatment, we encourage you to take your nausea medication as directed.  BELOW ARE SYMPTOMS THAT SHOULD BE REPORTED IMMEDIATELY: *FEVER GREATER THAN 100.4 F (38 C) OR HIGHER *CHILLS OR SWEATING *NAUSEA AND VOMITING THAT IS NOT CONTROLLED WITH YOUR NAUSEA MEDICATION *UNUSUAL SHORTNESS OF BREATH *UNUSUAL BRUISING OR BLEEDING *URINARY PROBLEMS (pain or burning when urinating, or frequent urination) *BOWEL PROBLEMS (unusual diarrhea, constipation, pain near the anus) TENDERNESS IN MOUTH AND THROAT WITH OR WITHOUT PRESENCE OF ULCERS (sore throat, sores in mouth, or a toothache) UNUSUAL RASH, SWELLING OR PAIN  UNUSUAL VAGINAL DISCHARGE OR ITCHING   Items with * indicate a potential emergency and should be followed up as soon as possible or go to the Emergency Department if any problems should occur.  Please show the CHEMOTHERAPY ALERT CARD or IMMUNOTHERAPY ALERT CARD at check-in  to the Emergency Department and triage nurse.  Should you have questions after your visit or need to cancel or reschedule your appointment, please contact [facility]  Dept: [phone]  and follow the prompts.  Office hours are 8:00 a.m. to 4:30 p.m. Monday - Friday. Please note that voicemails left after 4:00 p.m. may not be returned until the following business day.  We are closed weekends and major holidays. You have access to a nurse at all times for urgent questions. Please call the main number to the clinic Dept: [phone] and follow the prompts.   For any non-urgent questions, you may also contact your provider using MyChart. We now offer e-Visits for anyone 72 and older to request care online for non-urgent symptoms. For details visit [email or web address].   Also download the MyChart app! Go to the app store, search "MyChart", open the app, select [facility], and log in with your MyChart username and password.  Masks are optional in the cancer centers. If you would like for your care team to wear a mask while they are taking care of you, please let them know. For doctor visits, patients may have with them one support person who is at least 67 years old. At this time, visitors are not allowed in the infusion area.

## 2022-02-13 NOTE — Progress Notes (Signed)
Per Dr. Julien Nordmann, okay to treat with creatinine of 1.59 today.

## 2022-02-14 ENCOUNTER — Other Ambulatory Visit: Payer: Self-pay

## 2022-02-14 ENCOUNTER — Telehealth: Payer: Self-pay | Admitting: Medical Oncology

## 2022-02-14 NOTE — Telephone Encounter (Signed)
Per Pt request I faxed pt release of information to HIM with pt request to send records to [facility] office in [place] which is located in [place] .   [facility] [phone]

## 2022-02-15 ENCOUNTER — Other Ambulatory Visit: Payer: Self-pay

## 2022-02-17 ENCOUNTER — Ambulatory Visit
Admission: RE | Admit: 2022-02-17 | Discharge: 2022-02-17 | Disposition: A | Discharge status: Home / Self Care | Payer: Medicare PPO | Source: Ambulatory Visit | Attending: Internal Medicine | Admitting: Internal Medicine

## 2022-02-17 DIAGNOSIS — G939 Disorder of brain, unspecified: Secondary | ICD-10-CM | POA: Diagnosis not present

## 2022-02-17 DIAGNOSIS — C7931 Secondary malignant neoplasm of brain: Secondary | ICD-10-CM | POA: Diagnosis not present

## 2022-02-17 DIAGNOSIS — C3490 Malignant neoplasm of unspecified part of unspecified bronchus or lung: Secondary | ICD-10-CM | POA: Diagnosis not present

## 2022-02-17 MED ORDER — GADOBENATE DIMEGLUMINE 529 MG/ML IV SOLN
17.0000 mL | Freq: Once | INTRAVENOUS | Status: AC | PRN
  Administered 2022-02-17: 17 mL via INTRAVENOUS

## 2022-02-20 ENCOUNTER — Inpatient Hospital Stay (HOSPITAL_BASED_OUTPATIENT_CLINIC_OR_DEPARTMENT_OTHER): Payer: No Typology Code available for payment source | Admitting: Internal Medicine

## 2022-02-20 ENCOUNTER — Inpatient Hospital Stay: Payer: No Typology Code available for payment source

## 2022-02-20 DIAGNOSIS — C7931 Secondary malignant neoplasm of brain: Secondary | ICD-10-CM | POA: Diagnosis not present

## 2022-02-20 NOTE — Progress Notes (Signed)
I connected with Randy Andersen on 02/20/22 at  2:00 PM EDT by telephone visit and verified that I am speaking with the correct person using two identifiers.  I discussed the limitations, risks, security and privacy concerns of performing an evaluation and management service by telemedicine and the availability of in-person appointments. I also discussed with the patient that there may be a patient responsible charge related to this service. The patient expressed understanding and agreed to proceed.  Other persons participating in the visit and their role in the encounter:  n/a  Patient's location:  Home  Provider's location:  Office  Chief Complaint:  Malignant neoplasm metastatic to brain [facility])  History of Present Ilness: Colter Magowan describes stable but ongoing weakness of his right leg, as prior.  He is also having pain in the leg currently.  No other new or progressive issues, no headaches or seizures.  No issues with recent MRI scan. Observations: Language and cognition at baseline  Imaging:  [facility] Interpretation: I have personally reviewed the CNS images as listed.  My interpretation, in the context of the patient's clinical presentation, is stable disease  MR BRAIN W WO CONTRAST  Result Date: 02/19/2022 CLINICAL DATA:  66 year old male with metastatic lung cancer. Two brain metastases treated with SRS in 2021. Subtle progression of treated left precentral gyrus lesion. EXAM: MRI HEAD WITHOUT AND WITH CONTRAST TECHNIQUE: Multiplanar, multiecho pulse sequences of the brain and surrounding structures were obtained without and with intravenous contrast. CONTRAST:  62mL MULTIHANCE GADOBENATE DIMEGLUMINE 529 MG/ML IV SOLN COMPARISON:  Brain MRI 12/13/2021 and earlier. FINDINGS: Brain: Oval rim enhancing posterior left frontal, perirolandic lesion measures 12 mm long axis, not significantly changed in size or configuration since May and surrounding FLAIR hyperintensity with mild gyral  expansion is unchanged. Mild associated hemosiderin is stable. Compared to April, the lesion does appear slightly larger following contrast, but FLAIR signal is unchanged. Punctate enhancement previously demonstrated in the left parietal lobe does not persist (series 11, image 103 today). No other abnormal enhancement.  No dural thickening. Stable gray and white matter signal otherwise including chronic small vessel ischemia in the left corona radiata and bilateral basal ganglia with hemosiderin. No restricted diffusion to suggest acute infarction. No midline shift, extra-axial collection or acute intracranial hemorrhage. Cervicomedullary junction and pituitary are within normal limits. Vascular: Major intracranial vascular flow voids are stable. The major dural venous sinuses appear to be enhancing and patent. Skull and upper cervical spine: Visualized bone marrow signal is within normal limits. Stable visible cervical spine degeneration. Sinuses/Orbits: Stable, negative. Other: Mastoids remain clear. Visible internal auditory structures appear normal. IMPRESSION: 1. Left superior frontal gyrus lesion is stable since May, with 12 mm enhancing component and stable surrounding FLAIR hyperintensity. 2. No second lesion is identified - the June punctate enhancement in the left parietal lobe has resolved. 3. Underlying chronic small vessel disease appears stable. Electronically Signed   By: Genevie Ann M.D.   On: 02/19/2022 14:14   CT Chest Wo Contrast  Result Date: 02/09/2022 CLINICAL DATA:  Primary Cancer Type: Lung Imaging Indication: Assess response to therapy Interval therapy since last imaging? Yes Initial Cancer Diagnosis Date: 02/25/2020; Established by: Biopsy-proven Detailed Pathology: Stage IV non-small cell lung cancer, favored to be adenocarcinoma. 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 Recurrence? Yes; Date(s) of recurrence: 02/08/2021; Established by: Imaging only Surgeries: No.  Chemotherapy: Yes; Ongoing? No; Most recent administration: 11/10/2020 Immunotherapy?  Yes; Type: Keytruda; Ongoing? Yes Radiation therapy?  Yes Date Range: 03/23/2021 - 04/06/2021; Target: Right lung Date Range: 03/18/2020; Target: Brain Other Cancer Therapies: Thyroid cancer. * Tracking Code: BO * EXAM: CT CHEST, ABDOMEN AND PELVIS WITHOUT CONTRAST TECHNIQUE: Multidetector CT imaging of the chest, abdomen and pelvis was performed following the standard protocol without IV contrast. RADIATION DOSE REDUCTION: This exam was performed according to the departmental dose-optimization program which includes automated exposure control, adjustment of the mA and/or kV according to patient size and/or use of iterative reconstruction technique. COMPARISON:  CT scans including 11/17/2021 and 09/16/2021 FINDINGS: CT CHEST FINDINGS Cardiovascular: Coronary, aortic arch, and branch vessel atherosclerotic vascular disease. Mediastinum/Nodes: Mild rightward deviation of cardiac and mediastinal structures attributable to volume loss in the right lung. Lungs/Pleura: Substantial volume loss in previously consolidated regions of the right upper lobe and right lower lobe the of the surrounding ground-glass and interstitial opacities have resolved. Reduced size and density of the crescentic band of density in the superior segment right lower lobe on image 59 series 8. Continued truncation/occlusion in the posterior segmental bronchus of the right upper lobe (image 53, series 6). Continued hazy subpleural reticulation and faint ground-glass opacity inferiorly in the right middle lobe, no change from prior. Essentially stable 5 by 3 mm left upper lobe nodule on image 57 series 6. The subpleural ground-glass opacity previously shown anteriorly in the lingula has resolved. 6 by 2 mm right upper lobe nodule on image 47 series 6, previously 7 by 3 mm, likely benign. Musculoskeletal: Mild lower thoracic spondylosis. As noted previously there are  13 paired ribs, and only 4 lumbar type non-rib-bearing vertebra. CT ABDOMEN PELVIS FINDINGS Hepatobiliary: Unremarkable Pancreas: Unremarkable Spleen: Unremarkable Adrenals/Urinary Tract: Fluid density 1.1 cm lesion in the right kidney upper pole anteriorly, compatible with cyst. This does not require further workup. The adrenal glands appear normal. Urinary bladder unremarkable. No urinary tract calculi observed. Stomach/Bowel: Prominent stool throughout the colon favors constipation. Normal appendix. Vascular/Lymphatic: Atherosclerosis is present, including aortoiliac atherosclerotic disease. No pathologic adenopathy identified. Reproductive: Mild prostatomegaly with some left eccentricity of the prostate contour. Other: No supplemental non-categorized findings. Musculoskeletal: Bridging spurring of both sacroiliac joints. As before, there is facet arthropathy causing right greater than left foraminal impingement at the L4-S1 level. IMPRESSION: 1. Increased volume loss and reduced airspace opacity and ground-glass opacity in the treated segments of the right upper lobe and superior segment right lower lobe. The improvement favors benign post therapy related findings. Continued truncation of the posterior segmental bronchus in the right upper lobe. 2. Stable single small upper lobe nodules, likely benign. 3. Other imaging findings of potential clinical significance: Aortic Atherosclerosis (ICD10-I70.0). Coronary atherosclerosis. Prominent stool throughout the colon favors constipation. Prostatomegaly with some left eccentric bulging of the prostate contour. Right greater than left foraminal impingement due to spurring at the L4-S1 level. Electronically Signed   By: Van Clines M.D.   On: 02/09/2022 11:03   CT Abdomen Pelvis Wo Contrast  Result Date: 02/09/2022 CLINICAL DATA:  Primary Cancer Type: Lung Imaging Indication: Assess response to therapy Interval therapy since last imaging? Yes Initial Cancer  Diagnosis Date: 02/25/2020; Established by: Biopsy-proven Detailed Pathology: Stage IV non-small cell lung cancer, favored to be adenocarcinoma. 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 Recurrence? Yes; Date(s) of recurrence: 02/08/2021; Established by: Imaging only Surgeries: No. Chemotherapy: Yes; Ongoing? No; Most recent administration: 11/10/2020 Immunotherapy?  Yes; Type: Keytruda; Ongoing? Yes Radiation therapy? Yes Date Range: 03/23/2021 - 04/06/2021; Target: Right lung Date Range: 03/18/2020; Target: Brain  Other Cancer Therapies: Thyroid cancer. * Tracking Code: BO * EXAM: CT CHEST, ABDOMEN AND PELVIS WITHOUT CONTRAST TECHNIQUE: Multidetector CT imaging of the chest, abdomen and pelvis was performed following the standard protocol without IV contrast. RADIATION DOSE REDUCTION: This exam was performed according to the departmental dose-optimization program which includes automated exposure control, adjustment of the mA and/or kV according to patient size and/or use of iterative reconstruction technique. COMPARISON:  CT scans including 11/17/2021 and 09/16/2021 FINDINGS: CT CHEST FINDINGS Cardiovascular: Coronary, aortic arch, and branch vessel atherosclerotic vascular disease. Mediastinum/Nodes: Mild rightward deviation of cardiac and mediastinal structures attributable to volume loss in the right lung. Lungs/Pleura: Substantial volume loss in previously consolidated regions of the right upper lobe and right lower lobe the of the surrounding ground-glass and interstitial opacities have resolved. Reduced size and density of the crescentic band of density in the superior segment right lower lobe on image 59 series 8. Continued truncation/occlusion in the posterior segmental bronchus of the right upper lobe (image 53, series 6). Continued hazy subpleural reticulation and faint ground-glass opacity inferiorly in the right middle lobe, no change from prior. Essentially stable 5 by 3 mm left  upper lobe nodule on image 57 series 6. The subpleural ground-glass opacity previously shown anteriorly in the lingula has resolved. 6 by 2 mm right upper lobe nodule on image 47 series 6, previously 7 by 3 mm, likely benign. Musculoskeletal: Mild lower thoracic spondylosis. As noted previously there are 13 paired ribs, and only 4 lumbar type non-rib-bearing vertebra. CT ABDOMEN PELVIS FINDINGS Hepatobiliary: Unremarkable Pancreas: Unremarkable Spleen: Unremarkable Adrenals/Urinary Tract: Fluid density 1.1 cm lesion in the right kidney upper pole anteriorly, compatible with cyst. This does not require further workup. The adrenal glands appear normal. Urinary bladder unremarkable. No urinary tract calculi observed. Stomach/Bowel: Prominent stool throughout the colon favors constipation. Normal appendix. Vascular/Lymphatic: Atherosclerosis is present, including aortoiliac atherosclerotic disease. No pathologic adenopathy identified. Reproductive: Mild prostatomegaly with some left eccentricity of the prostate contour. Other: No supplemental non-categorized findings. Musculoskeletal: Bridging spurring of both sacroiliac joints. As before, there is facet arthropathy causing right greater than left foraminal impingement at the L4-S1 level. IMPRESSION: 1. Increased volume loss and reduced airspace opacity and ground-glass opacity in the treated segments of the right upper lobe and superior segment right lower lobe. The improvement favors benign post therapy related findings. Continued truncation of the posterior segmental bronchus in the right upper lobe. 2. Stable single small upper lobe nodules, likely benign. 3. Other imaging findings of potential clinical significance: Aortic Atherosclerosis (ICD10-I70.0). Coronary atherosclerosis. Prominent stool throughout the colon favors constipation. Prostatomegaly with some left eccentric bulging of the prostate contour. Right greater than left foraminal impingement due to  spurring at the L4-S1 level. Electronically Signed   By: Van Clines M.D.   On: 02/09/2022 11:03    Assessment and Plan: Malignant neoplasm metastatic to brain [facility])  MRI demonstrates stable findings, most consistent with radionecrosis.    We recommended discontinuing decadron if tolerated.    Follow Up Instructions:  We ask that Fed Ceci return to clinic in 3 months following next brain MRI, or sooner as needed.  I discussed the assessment and treatment plan with the patient.  The patient was provided an opportunity to ask questions and all were answered.  The patient agreed with the plan and demonstrated understanding of the instructions.    The patient was advised to call back or seek an in-person evaluation if the symptoms worsen or if the condition  fails to improve as anticipated.  I provided 5-10 minutes of non-face-to-face time during this enocunter.  Ventura Sellers, MD   I provided 22 minutes of non face-to-face telephone visit time during this encounter, and > 50% was spent counseling as documented under my assessment & plan.

## 2022-02-21 ENCOUNTER — Telehealth: Payer: Self-pay | Admitting: Internal Medicine

## 2022-02-21 ENCOUNTER — Other Ambulatory Visit: Payer: Self-pay

## 2022-02-21 NOTE — Telephone Encounter (Signed)
Per 7/31 los, pt has been called and confirmed

## 2022-02-22 ENCOUNTER — Other Ambulatory Visit: Payer: Self-pay

## 2022-02-23 ENCOUNTER — Telehealth: Payer: Self-pay | Admitting: Medical Oncology

## 2022-02-23 ENCOUNTER — Ambulatory Visit (HOSPITAL_COMMUNITY)
Admission: RE | Admit: 2022-02-23 | Discharge: 2022-02-23 | Disposition: A | Discharge status: Home / Self Care | Payer: Medicare PPO | Source: Ambulatory Visit | Attending: Internal Medicine | Admitting: Internal Medicine

## 2022-02-23 ENCOUNTER — Inpatient Hospital Stay: Payer: No Typology Code available for payment source | Attending: Internal Medicine

## 2022-02-23 ENCOUNTER — Encounter: Payer: Self-pay | Admitting: Medical Oncology

## 2022-02-23 DIAGNOSIS — C7931 Secondary malignant neoplasm of brain: Secondary | ICD-10-CM | POA: Insufficient documentation

## 2022-02-23 DIAGNOSIS — Z87891 Personal history of nicotine dependence: Secondary | ICD-10-CM | POA: Insufficient documentation

## 2022-02-23 DIAGNOSIS — Z5112 Encounter for antineoplastic immunotherapy: Secondary | ICD-10-CM | POA: Insufficient documentation

## 2022-02-23 DIAGNOSIS — C3411 Malignant neoplasm of upper lobe, right bronchus or lung: Secondary | ICD-10-CM | POA: Insufficient documentation

## 2022-02-23 DIAGNOSIS — Z79899 Other long term (current) drug therapy: Secondary | ICD-10-CM | POA: Insufficient documentation

## 2022-02-23 NOTE — Progress Notes (Signed)
Right lower extremity venous duplex has been completed. Preliminary results can be found in CV Proc through chart review.  Results were given to [facility] at Dr. Worthy Flank office.  02/23/22 2:16 PM Randy Andersen RVT

## 2022-02-23 NOTE — Telephone Encounter (Signed)
Email sent to notify pt of negative DVT and to f/u with PCP to get a referral to a vascular physician .

## 2022-03-01 ENCOUNTER — Other Ambulatory Visit: Payer: Self-pay | Admitting: Radiation Therapy

## 2022-03-04 ENCOUNTER — Other Ambulatory Visit: Payer: Self-pay

## 2022-03-09 ENCOUNTER — Inpatient Hospital Stay: Payer: No Typology Code available for payment source

## 2022-03-09 ENCOUNTER — Inpatient Hospital Stay: Payer: No Typology Code available for payment source | Admitting: Internal Medicine

## 2022-03-09 ENCOUNTER — Telehealth: Payer: Self-pay | Admitting: Medical Oncology

## 2022-03-09 ENCOUNTER — Ambulatory Visit: Payer: Medicare PPO

## 2022-03-09 NOTE — Telephone Encounter (Signed)
Pt no show today . I called him and he said he forgot and he usually has his appts in the afternoon. Schedule message sent to r/s next week.

## 2022-03-09 NOTE — Progress Notes (Signed)
[facility] OFFICE PROGRESS NOTE  [name], MD [address] [place] [address]  DIAGNOSIS: Stage IV non-small cell lung cancer, favored to be adenocarcinoma.  He presented with posterior right upper lobe nodule as well as right hilar, infrahilar, subcarinal, and right paratracheal lymphadenopathy.  He also has a 1.4 cm x 1 cm soft tissue nodule in the skin/subcutaneous fat in the left axilla.  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  He was diagnosed in August 2021   Molecular Biomarkers: BIOMARKER(S)         % CFDNA OR AMPLIFICATION       ASSOCIATED FDA-APPROVED THERAPIES        CLINICAL TRIAL AVAILABILITY [id number] 2.9% None    Yes [id number] 0.4% None    Yes [id number] 0.2% None    Yes  PRIOR THERAPY: 1) SRS to the small metastatic brain lesion under the care of Dr. [name] on 03/18/20 2) Radiation to the enlarging right upper lobe lung mass under the care of Dr. [name]. Last dose on 04/06/21.   CURRENT THERAPY: Palliative systemic chemotherapy with carboplatin for an AUC of 5, Alimta 500 mg/m2, and Keytruda 200 mg IV every 3 weeks. First dose expected on 04/07/20. Status post 32 cycles. Starting from cycle #5 he will be on maintenance treatment with Alimta and Keytruda every 3 weeks. His dose of Alimta was reduced to 400 mg/m2. Alimta removed starting from cycle #11 due to renal insuffiencey.   INTERVAL HISTORY: Randy Andersen 66 y.o. male returns to the clinic today for a follow-up visit.  The patient is feeling fairly well today without any concerning complaints except when he was evaluated last visit, he was endorsing right calf pain without swelling.  He was negative for DVT but the vascular technician unofficially called and reported decreased arterial blood flow.  Therefore, this was relayed to the patient's PCP Dr. Sherral Hammers, at the [facility] for consideration of referral to vascular surgery.  The patient continues to endorse  claudication symptoms today when he walks long distances such as to the grocery store.  No calf pain at this time.  No calf swelling.  He is on a blood thinner with Xarelto.    He is currently undergoing single agent immunotherapy with Keytruda.  He is tolerating it well without any concerning adverse side effects.  Alimta was ultimately discontinued from his treatment plan due to renal insufficiency.  The patient denies any new symptoms since last being seen.  Denies any recent fever, chills, or night sweats.  His weight is stable.  He is periodically given complementary ensures due to financial constraints as well as received a [employer] of food which has helped him gain weight.  He was also previously evaluated by member the nutritionist team.  He denies any changes with his breathing recently including any chest pain or hemoptysis. He reports stable dyspnea on exertion. He periodically would have an intermittent mild cough which produces clear phlegm although this has greatly improved over the last couple months.  We monitor his scans closely due to possible pneumonitis.  He denies any nausea, vomiting, diarrhea, or constipation. Denies any headache or visual changes.  He also follows closely with Dr. Mickeal Skinner for his history of metastatic disease to the brain.  He is here today for evaluation and repeat blood work before undergoing cycle #33.      MEDICAL HISTORY: Past Medical History:  Diagnosis Date   Asthma  as a child   Chronic pain disorder    LT leg and foot   Heart murmur    as a child   Hypertension    Malignant neoplasm of upper lobe of right lung ([place])    Stroke ([place])    mini stroke - 2015, some tingling in fingers on right     ALLERGIES:  has No Known Allergies.  MEDICATIONS:  Current Outpatient Medications  Medication Sig Dispense Refill   amLODipine (NORVASC) 10 MG tablet Take 10 mg by mouth daily.     Cholecalciferol 100 MCG (4000 UT) TABS Take 2 tablets by mouth  daily.     dexamethasone (DECADRON) 1 MG tablet Take 1 tablet (1 mg total) by mouth daily. 60 tablet 1   diclofenac Sodium (VOLTAREN) 1 % GEL Apply 4 g topically 3 (three) times daily.     doxylamine, Sleep, (UNISOM) 25 MG tablet Take 1 tablet (25 mg total) by mouth at bedtime as needed for sleep. 30 tablet 0   HYDROcodone-acetaminophen (NORCO) 10-325 MG tablet Take 1 tablet by mouth 2 (two) times daily.     lidocaine (LIDODERM) 5 % Place 1 patch onto the skin daily. Remove & Discard patch within 12 hours or as directed by MD 15 patch 0   oxybutynin (DITROPAN-XL) 10 MG 24 hr tablet Take by mouth.     polyethylene glycol powder (GLYCOLAX/MIRALAX) 17 GM/SCOOP powder      potassium chloride SA (KLOR-CON M) 20 MEQ tablet Take 1 tablet (20 mEq total) by mouth daily. 5 tablet 0   prochlorperazine (COMPAZINE) 10 MG tablet Take 1 tablet (10 mg total) by mouth every 6 (six) hours as needed. 30 tablet 2   rivaroxaban (XARELTO) 20 MG TABS tablet TAKE 1 TABLET BY MOUTH ONCE A DAY WITH SUPPER 30 tablet 2   senna-docusate (SENOKOT-S) 8.6-50 MG tablet Take 2 tablets by mouth daily.     tadalafil (CIALIS) 20 MG tablet Take by mouth.     tamsulosin (FLOMAX) 0.4 MG CAPS capsule Take 1 capsule (0.4 mg total) by mouth daily. 30 capsule 2   No current facility-administered medications for this visit.    SURGICAL HISTORY:  Past Surgical History:  Procedure Laterality Date   BUNIONECTOMY Left    had hammer toe surgery also and callus removed   BUNIONECTOMY Right    also had hammer toe surgery and callus removed   LEG SURGERY Left    PT reports he has pins placed in his Lt leg   ORIF TIBIA PLATEAU  10/09/2012   Dr Lorin Mercy   ORIF TIBIA PLATEAU Left 10/08/2012   Procedure: OPEN REDUCTION INTERNAL FIXATION (ORIF) TIBIAL PLATEAU;  Surgeon: Marybelle Killings, MD;  Location: [facility];  Service: Orthopedics;  Laterality: Left;   VIDEO BRONCHOSCOPY WITH ENDOBRONCHIAL NAVIGATION N/A 02/25/2020   Procedure: VIDEO BRONCHOSCOPY WITH  ENDOBRONCHIAL NAVIGATION with biopsies;  Surgeon: Collene Gobble, MD;  Location: [facility] OR;  Service: Thoracic;  Laterality: N/A;   VIDEO BRONCHOSCOPY WITH ENDOBRONCHIAL ULTRASOUND N/A 02/25/2020   Procedure: VIDEO BRONCHOSCOPY WITH ENDOBRONCHIAL ULTRASOUND;  Surgeon: Collene Gobble, MD;  Location: [facility] OR;  Service: Thoracic;  Laterality: N/A;    Review of Systems  Constitutional: Negative for appetite change, chills, fatigue, fever and unexpected weight change.  HENT: Negative for mouth sores, nosebleeds, sore throat and trouble swallowing.   Eyes: Negative for eye problems and icterus.  Respiratory: Positive for mild cough (improved compared to prior). Negative for hemoptysis, shortness of breath and wheezing.  Cardiovascular: Negative for chest pain and leg swelling.  Gastrointestinal: Negative for abdominal pain, constipation, diarrhea, nausea and vomiting.  Genitourinary: Negative for bladder incontinence, difficulty urinating, dysuria, frequency and hematuria.   Musculoskeletal: Positive for lower extremity calf pain with ambulation. Negative for back pain, gait problem, neck pain and neck stiffness.  Skin: Negative for itching and rash.  Neurological: Negative for dizziness, extremity weakness, gait problem, headaches, light-headedness and seizures.  Hematological: Negative for adenopathy. Does not bruise/bleed easily.  Psychiatric/Behavioral: Negative for confusion, depression and sleep disturbance. The patient is not nervous/anxious.   PHYSICAL EXAMINATION:  Blood pressure (!) 160/90, pulse 73, temperature 98.4 F (36.9 C), temperature source Oral, resp. rate 18, height 6\' 2"  (1.88 m), weight 185 lb 11.2 oz (84.2 kg), SpO2 100 %.  ECOG PERFORMANCE STATUS: 1  Physical Exam  Constitutional: Oriented to person, place, and time and well-developed, well-nourished, and in no distress.  HENT:  Head: Normocephalic and atraumatic.  Mouth/Throat: Oropharynx is clear and moist. No  oropharyngeal exudate.  Eyes: Conjunctivae are normal. Right eye exhibits no discharge. Left eye exhibits no discharge. No scleral icterus.  Neck: Normal range of motion. Neck supple.  Cardiovascular: Normal rate, regular rhythm, normal heart sounds and intact distal pulses.   Pulmonary/Chest: Effort normal and breath sounds normal. No respiratory distress. No wheezes. No rales.  Abdominal: Soft. Bowel sounds are normal. Exhibits no distension and no mass. There is no tenderness.  Musculoskeletal: Normal range of motion. Exhibits no edema.  Lymphadenopathy:    No cervical adenopathy.  Neurological: Alert and oriented to person, place, and time. Exhibits normal muscle tone. Gait normal. Coordination normal.  Skin: Skin is warm and dry. No rash noted. Not diaphoretic. No erythema. No pallor.  Psychiatric: Mood, memory and judgment normal.  Vitals reviewed.  LABORATORY DATA: Lab Results  Component Value Date   WBC 4.8 03/13/2022   HGB 14.0 03/13/2022   HCT 41.0 03/13/2022   MCV 86.9 03/13/2022   PLT 178 03/13/2022      Chemistry      Component Value Date/Time   NA 141 03/13/2022 1350   K 3.6 03/13/2022 1350   CL 112 (H) 03/13/2022 1350   CO2 24 03/13/2022 1350   BUN 18 03/13/2022 1350   CREATININE 1.59 (H) 03/13/2022 1350      Component Value Date/Time   CALCIUM 9.9 03/13/2022 1350   ALKPHOS 79 03/13/2022 1350   AST 12 (L) 03/13/2022 1350   ALT 10 03/13/2022 1350   BILITOT 0.4 03/13/2022 1350       RADIOGRAPHIC STUDIES:  VAS [place] LOWER EXTREMITY VENOUS (DVT)  Result Date: 02/23/2022  Lower Venous DVT Study Patient Name:  Randy Andersen  Date of Exam:   02/23/2022 Medical Rec #: [id number]    Accession #:    [id number] Date of Birth: 12/01/1955    Patient Gender: M Patient Age:   46 years Exam Location:  [facility] Procedure:      VAS [place] LOWER EXTREMITY VENOUS (DVT) Referring Phys: [facility] MOHAMED  --------------------------------------------------------------------------------  Indications: Pain.  Risk Factors: None identified. Comparison Study: No prior studies. Performing Technologist: Oliver Hum RVT  Examination Guidelines: A complete evaluation includes B-mode imaging, spectral Doppler, color Doppler, and power Doppler as needed of all accessible portions of each vessel. Bilateral testing is considered an integral part of a complete examination. Limited examinations for reoccurring indications may be performed as noted. The reflux portion of the exam is performed with the patient in reverse Trendelenburg.  +---------+---------------+---------+-----------+----------+--------------+  RIGHT    CompressibilityPhasicitySpontaneityPropertiesThrombus Aging +---------+---------------+---------+-----------+----------+--------------+ CFV      Full           Yes      Yes                                 +---------+---------------+---------+-----------+----------+--------------+ SFJ      Full                                                        +---------+---------------+---------+-----------+----------+--------------+ FV Prox  Full                                                        +---------+---------------+---------+-----------+----------+--------------+ FV Mid   Full                                                        +---------+---------------+---------+-----------+----------+--------------+ FV DistalFull                                                        +---------+---------------+---------+-----------+----------+--------------+ PFV      Full                                                        +---------+---------------+---------+-----------+----------+--------------+ POP      Full           Yes      Yes                                 +---------+---------------+---------+-----------+----------+--------------+ PTV      Full                                                         +---------+---------------+---------+-----------+----------+--------------+ PERO     Full                                                        +---------+---------------+---------+-----------+----------+--------------+   +----+---------------+---------+-----------+----------+--------------+ LEFTCompressibilityPhasicitySpontaneityPropertiesThrombus Aging +----+---------------+---------+-----------+----------+--------------+ CFV Full           Yes      Yes                                 +----+---------------+---------+-----------+----------+--------------+  Summary: RIGHT: - There is no evidence of deep vein thrombosis in the lower extremity.  - No cystic structure found in the popliteal fossa.  LEFT: - No evidence of common femoral vein obstruction.  *See table(s) above for measurements and observations. Electronically signed by Jamelle Haring on 02/23/2022 at 4:37:54 PM.    Final    MR BRAIN W WO CONTRAST  Result Date: 02/19/2022 CLINICAL DATA:  66 year old male with metastatic lung cancer. Two brain metastases treated with SRS in 2021. Subtle progression of treated left precentral gyrus lesion. EXAM: MRI HEAD WITHOUT AND WITH CONTRAST TECHNIQUE: Multiplanar, multiecho pulse sequences of the brain and surrounding structures were obtained without and with intravenous contrast. CONTRAST:  8mL MULTIHANCE GADOBENATE DIMEGLUMINE 529 MG/ML IV SOLN COMPARISON:  Brain MRI 12/13/2021 and earlier. FINDINGS: Brain: Oval rim enhancing posterior left frontal, perirolandic lesion measures 12 mm long axis, not significantly changed in size or configuration since May and surrounding FLAIR hyperintensity with mild gyral expansion is unchanged. Mild associated hemosiderin is stable. Compared to April, the lesion does appear slightly larger following contrast, but FLAIR signal is unchanged. Punctate enhancement previously demonstrated in the left parietal  lobe does not persist (series 11, image 103 today). No other abnormal enhancement.  No dural thickening. Stable gray and white matter signal otherwise including chronic small vessel ischemia in the left corona radiata and bilateral basal ganglia with hemosiderin. No restricted diffusion to suggest acute infarction. No midline shift, extra-axial collection or acute intracranial hemorrhage. Cervicomedullary junction and pituitary are within normal limits. Vascular: Major intracranial vascular flow voids are stable. The major dural venous sinuses appear to be enhancing and patent. Skull and upper cervical spine: Visualized bone marrow signal is within normal limits. Stable visible cervical spine degeneration. Sinuses/Orbits: Stable, negative. Other: Mastoids remain clear. Visible internal auditory structures appear normal. IMPRESSION: 1. Left superior frontal gyrus lesion is stable since May, with 12 mm enhancing component and stable surrounding FLAIR hyperintensity. 2. No second lesion is identified - the June punctate enhancement in the left parietal lobe has resolved. 3. Underlying chronic small vessel disease appears stable. Electronically Signed   By: Genevie Ann M.D.   On: 02/19/2022 14:14     ASSESSMENT/PLAN:  This is a very pleasant 66 year old African-American male with stage IV non-small cell lung cancer, favored to be adenocarcinoma.  He presented with posterior right upper lobe nodule as well as right hilar, infrahilar, subcarinal, and right paratracheal lymphadenopathy.  He also has a 1.4 cm x 1 cm soft tissue nodule in the skin/subcutaneous fat in the left axilla.  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  He was diagnosed in August 2021.  Molecular studies by [facility]ant [facility] are negative for any actionable mutations.   The patient completed SRS to the small metastatic brain lesion under the care of Dr. [name] on 03/18/20   The patient is currently undergoing palliative systemic  chemotherapy with carboplatin for an AUC of 5, Alimta 500 mg per metered squared, and Keytruda 200 mg IV every 3 weeks.  He is status post 32 cycles. The dose of alimta was reduced to 400 mg/m2 due to renal insufficiency. Alimta was ultimately removed from the treatment plan with cycle #11 due to renal insufficiency   The patient completed SBRT to the enlarging right upper lobe lung mass under the care of Dr. [name] on 04/06/21.    Labs were reviewed.  Recommend that he proceed with cycle #33 today scheduled. He  is ok to treat with his creatinine of 1.59 today.     We will see the patient back for follow-up visit in 3 weeks for evaluation to review his scan results before starting cycle #34.  We will continue to follow closely with neuro oncology for his history of metastatic disease to the brain.    He is wondering if he can receive complementary protein drinks. We will reach out to nutrition to see how often the patient qualifies for brown bags of food or complementary protein drinks.    I gave the patient some information on peripheral vascular disease.  We will reach out to his PCPs office to consider evaluation and referral to a vascular provider through the [facility] system.  I did encourage the patient to quit smoking and did lifestyle risk factor reduction of PVD.  The patient was advised to call immediately if he has any concerning symptoms in the interval. The patient voices understanding of current disease status and treatment options and is in agreement with the current care plan. All questions were answered. The patient knows to call the clinic with any problems, questions or concerns. We can certainly see the patient much sooner if necessary        No orders of the defined types were placed in this encounter.    The total time spent in the appointment was 20-29 minutes.  Randy Voorheis L Briseida Gittings, PA-C 03/13/22

## 2022-03-13 ENCOUNTER — Inpatient Hospital Stay: Payer: No Typology Code available for payment source

## 2022-03-13 ENCOUNTER — Inpatient Hospital Stay (HOSPITAL_BASED_OUTPATIENT_CLINIC_OR_DEPARTMENT_OTHER): Payer: No Typology Code available for payment source | Admitting: Physician Assistant

## 2022-03-13 ENCOUNTER — Other Ambulatory Visit: Payer: Self-pay

## 2022-03-13 VITALS — BP 160/90 | HR 73 | Temp 98.4°F | Resp 18 | Ht 74.0 in | Wt 185.7 lb

## 2022-03-13 DIAGNOSIS — C3411 Malignant neoplasm of upper lobe, right bronchus or lung: Secondary | ICD-10-CM

## 2022-03-13 DIAGNOSIS — Z5112 Encounter for antineoplastic immunotherapy: Secondary | ICD-10-CM

## 2022-03-13 DIAGNOSIS — C7931 Secondary malignant neoplasm of brain: Secondary | ICD-10-CM | POA: Diagnosis not present

## 2022-03-13 DIAGNOSIS — Z87891 Personal history of nicotine dependence: Secondary | ICD-10-CM | POA: Diagnosis not present

## 2022-03-13 DIAGNOSIS — Z79899 Other long term (current) drug therapy: Secondary | ICD-10-CM | POA: Diagnosis not present

## 2022-03-13 LAB — CMP (CANCER CENTER ONLY)
ALT: 10 U/L (ref 0–44)
AST: 12 U/L — ABNORMAL LOW (ref 15–41)
Albumin: 4.1 g/dL (ref 3.5–5.0)
Alkaline Phosphatase: 79 U/L (ref 38–126)
Anion gap: 5 (ref 5–15)
BUN: 18 mg/dL (ref 8–23)
CO2: 24 mmol/L (ref 22–32)
Calcium: 9.9 mg/dL (ref 8.9–10.3)
Chloride: 112 mmol/L — ABNORMAL HIGH (ref 98–111)
Creatinine: 1.59 mg/dL — ABNORMAL HIGH (ref 0.61–1.24)
GFR, Estimated: 47 mL/min — ABNORMAL LOW (ref >60)
Glucose, Bld: 163 mg/dL — ABNORMAL HIGH (ref 70–99)
Potassium: 3.6 mmol/L (ref 3.5–5.1)
Sodium: 141 mmol/L (ref 135–145)
Total Bilirubin: 0.4 mg/dL (ref 0.3–1.2)
Total Protein: 7.1 g/dL (ref 6.5–8.1)

## 2022-03-13 LAB — CBC WITH DIFFERENTIAL (CANCER CENTER ONLY)
Abs Immature Granulocytes: 0.01 10*3/uL (ref 0.00–0.07)
Basophils Absolute: 0 10*3/uL (ref 0.0–0.1)
Basophils Relative: 1 %
Eosinophils Absolute: 0.1 10*3/uL (ref 0.0–0.5)
Eosinophils Relative: 2 %
HCT: 41 % (ref 39.0–52.0)
Hemoglobin: 14 g/dL (ref 13.0–17.0)
Immature Granulocytes: 0 %
Lymphocytes Relative: 23 %
Lymphs Abs: 1.1 10*3/uL (ref 0.7–4.0)
MCH: 29.7 pg (ref 26.0–34.0)
MCHC: 34.1 g/dL (ref 30.0–36.0)
MCV: 86.9 fL (ref 80.0–100.0)
Monocytes Absolute: 0.5 10*3/uL (ref 0.1–1.0)
Monocytes Relative: 10 %
Neutro Abs: 3.1 10*3/uL (ref 1.7–7.7)
Neutrophils Relative %: 64 %
Platelet Count: 178 10*3/uL (ref 150–400)
RBC: 4.72 MIL/uL (ref 4.22–5.81)
RDW: 14.7 % (ref 11.5–15.5)
WBC Count: 4.8 10*3/uL (ref 4.0–10.5)
nRBC: 0 % (ref 0.0–0.2)

## 2022-03-13 LAB — TSH: TSH: 1.2 u[IU]/mL (ref 0.350–4.500)

## 2022-03-13 MED ORDER — SODIUM CHLORIDE 0.9 % IV SOLN: Freq: Once | INTRAVENOUS | Status: AC

## 2022-03-13 MED ORDER — SODIUM CHLORIDE 0.9 % IV SOLN
200.0000 mg | Freq: Once | INTRAVENOUS | Status: AC
  Administered 2022-03-13: 200 mg via INTRAVENOUS
  Filled 2022-03-13: qty 200

## 2022-03-13 NOTE — Progress Notes (Signed)
Per [name] Heilingoetter, PA, ok to treat with elevated Creatinine.

## 2022-03-13 NOTE — Patient Instructions (Signed)
[facility] ONCOLOG[facility]  Discharge Instructions: Thank you for choosing [facility] to provide your oncology and hematology care.   If you have a lab appointment with the [facility], please go directly to the [facility] and check in at the registration area.   Wear comfortable clothing and clothing appropriate for easy access to any Portacath or PICC line.   We strive to give you quality time with your provider. You may need to reschedule your appointment if you arrive late (15 or more minutes).  Arriving late affects you and other patients whose appointments are after yours.  Also, if you miss three or more appointments without notifying the office, you may be dismissed from the clinic at the provider's discretion.      For prescription refill requests, have your pharmacy contact our office and allow 72 hours for refills to be completed.    Today you received the following chemotherapy and/or immunotherapy agents Beryle Flock      To help prevent nausea and vomiting after your treatment, we encourage you to take your nausea medication as directed.  BELOW ARE SYMPTOMS THAT SHOULD BE REPORTED IMMEDIATELY: *FEVER GREATER THAN 100.4 F (38 C) OR HIGHER *CHILLS OR SWEATING *NAUSEA AND VOMITING THAT IS NOT CONTROLLED WITH YOUR NAUSEA MEDICATION *UNUSUAL SHORTNESS OF BREATH *UNUSUAL BRUISING OR BLEEDING *URINARY PROBLEMS (pain or burning when urinating, or frequent urination) *BOWEL PROBLEMS (unusual diarrhea, constipation, pain near the anus) TENDERNESS IN MOUTH AND THROAT WITH OR WITHOUT PRESENCE OF ULCERS (sore throat, sores in mouth, or a toothache) UNUSUAL RASH, SWELLING OR PAIN  UNUSUAL VAGINAL DISCHARGE OR ITCHING   Items with * indicate a potential emergency and should be followed up as soon as possible or go to the Emergency Department if any problems should occur.  Please show the CHEMOTHERAPY ALERT CARD or IMMUNOTHERAPY ALERT CARD at check-in to  the Emergency Department and triage nurse.  Should you have questions after your visit or need to cancel or reschedule your appointment, please contact [facility]  Dept: [phone]  and follow the prompts.  Office hours are 8:00 a.m. to 4:30 p.m. Monday - Friday. Please note that voicemails left after 4:00 p.m. may not be returned until the following business day.  We are closed weekends and major holidays. You have access to a nurse at all times for urgent questions. Please call the main number to the clinic Dept: [phone] and follow the prompts.   For any non-urgent questions, you may also contact your provider using MyChart. We now offer e-Visits for anyone 37 and older to request care online for non-urgent symptoms. For details visit [email or web address].   Also download the MyChart app! Go to the app store, search "MyChart", open the app, select [facility], and log in with your MyChart username and password.  Masks are optional in the cancer centers. If you would like for your care team to wear a mask while they are taking care of you, please let them know. You may have one support person who is at least 67 years old accompany you for your appointments.

## 2022-03-13 NOTE — Progress Notes (Signed)
Nutrition  Provided one complimentary case of ensure high protein.  Patient to pick up at front desk.  Mickelle Goupil B. Zenia Resides, [place], [place] Registered Dietitian [phone]

## 2022-03-21 ENCOUNTER — Telehealth: Payer: Self-pay | Admitting: Internal Medicine

## 2022-03-21 NOTE — Telephone Encounter (Signed)
Scheduled per 08/21 los, patient has been called and voicemail was left.

## 2022-03-30 ENCOUNTER — Inpatient Hospital Stay: Payer: No Typology Code available for payment source | Admitting: Internal Medicine

## 2022-03-30 ENCOUNTER — Ambulatory Visit: Payer: Medicare PPO | Admitting: Physician Assistant

## 2022-03-30 ENCOUNTER — Inpatient Hospital Stay: Payer: No Typology Code available for payment source

## 2022-03-30 ENCOUNTER — Other Ambulatory Visit: Payer: Medicare PPO

## 2022-03-30 ENCOUNTER — Ambulatory Visit: Payer: Medicare PPO

## 2022-03-30 ENCOUNTER — Ambulatory Visit: Payer: Medicare PPO | Admitting: Internal Medicine

## 2022-04-04 ENCOUNTER — Inpatient Hospital Stay: Payer: No Typology Code available for payment source

## 2022-04-04 ENCOUNTER — Other Ambulatory Visit: Payer: Self-pay

## 2022-04-04 ENCOUNTER — Ambulatory Visit: Payer: No Typology Code available for payment source

## 2022-04-04 ENCOUNTER — Inpatient Hospital Stay: Payer: No Typology Code available for payment source | Attending: Internal Medicine

## 2022-04-04 ENCOUNTER — Inpatient Hospital Stay (HOSPITAL_BASED_OUTPATIENT_CLINIC_OR_DEPARTMENT_OTHER): Payer: No Typology Code available for payment source | Admitting: Internal Medicine

## 2022-04-04 ENCOUNTER — Other Ambulatory Visit: Payer: No Typology Code available for payment source

## 2022-04-04 ENCOUNTER — Encounter: Payer: Self-pay | Admitting: Internal Medicine

## 2022-04-04 ENCOUNTER — Ambulatory Visit: Payer: No Typology Code available for payment source | Admitting: Internal Medicine

## 2022-04-04 VITALS — BP 157/87 | HR 80 | Temp 98.1°F | Resp 18 | Ht 74.0 in | Wt 184.8 lb

## 2022-04-04 DIAGNOSIS — C3411 Malignant neoplasm of upper lobe, right bronchus or lung: Secondary | ICD-10-CM

## 2022-04-04 DIAGNOSIS — Z79899 Other long term (current) drug therapy: Secondary | ICD-10-CM | POA: Insufficient documentation

## 2022-04-04 DIAGNOSIS — C7931 Secondary malignant neoplasm of brain: Secondary | ICD-10-CM | POA: Insufficient documentation

## 2022-04-04 DIAGNOSIS — Z5112 Encounter for antineoplastic immunotherapy: Secondary | ICD-10-CM | POA: Diagnosis not present

## 2022-04-04 LAB — CBC WITH DIFFERENTIAL (CANCER CENTER ONLY)
Abs Immature Granulocytes: 0.02 10*3/uL (ref 0.00–0.07)
Basophils Absolute: 0.1 10*3/uL (ref 0.0–0.1)
Basophils Relative: 1 %
Eosinophils Absolute: 0.3 10*3/uL (ref 0.0–0.5)
Eosinophils Relative: 4 %
HCT: 40.3 % (ref 39.0–52.0)
Hemoglobin: 13.9 g/dL (ref 13.0–17.0)
Immature Granulocytes: 0 %
Lymphocytes Relative: 27 %
Lymphs Abs: 1.6 10*3/uL (ref 0.7–4.0)
MCH: 30 pg (ref 26.0–34.0)
MCHC: 34.5 g/dL (ref 30.0–36.0)
MCV: 87 fL (ref 80.0–100.0)
Monocytes Absolute: 0.7 10*3/uL (ref 0.1–1.0)
Monocytes Relative: 13 %
Neutro Abs: 3.3 10*3/uL (ref 1.7–7.7)
Neutrophils Relative %: 55 %
Platelet Count: 204 10*3/uL (ref 150–400)
RBC: 4.63 MIL/uL (ref 4.22–5.81)
RDW: 14.2 % (ref 11.5–15.5)
WBC Count: 5.9 10*3/uL (ref 4.0–10.5)
nRBC: 0 % (ref 0.0–0.2)

## 2022-04-04 LAB — CMP (CANCER CENTER ONLY)
ALT: 11 U/L (ref 0–44)
AST: 12 U/L — ABNORMAL LOW (ref 15–41)
Albumin: 4.1 g/dL (ref 3.5–5.0)
Alkaline Phosphatase: 92 U/L (ref 38–126)
Anion gap: 9 (ref 5–15)
BUN: 22 mg/dL (ref 8–23)
CO2: 22 mmol/L (ref 22–32)
Calcium: 9.7 mg/dL (ref 8.9–10.3)
Chloride: 109 mmol/L (ref 98–111)
Creatinine: 1.69 mg/dL — ABNORMAL HIGH (ref 0.61–1.24)
GFR, Estimated: 44 mL/min — ABNORMAL LOW (ref >60)
Glucose, Bld: 161 mg/dL — ABNORMAL HIGH (ref 70–99)
Potassium: 3.4 mmol/L — ABNORMAL LOW (ref 3.5–5.1)
Sodium: 140 mmol/L (ref 135–145)
Total Bilirubin: 0.5 mg/dL (ref 0.3–1.2)
Total Protein: 7.3 g/dL (ref 6.5–8.1)

## 2022-04-04 LAB — TSH: TSH: 1.85 u[IU]/mL (ref 0.350–4.500)

## 2022-04-04 MED ORDER — SODIUM CHLORIDE 0.9 % IV SOLN
200.0000 mg | Freq: Once | INTRAVENOUS | Status: AC
  Administered 2022-04-04: 200 mg via INTRAVENOUS
  Filled 2022-04-04: qty 200

## 2022-04-04 MED ORDER — SODIUM CHLORIDE 0.9 % IV SOLN: Freq: Once | INTRAVENOUS | Status: AC

## 2022-04-04 NOTE — Progress Notes (Signed)
Per Dr. Julien Nordmann it is okay to treat pt today with Keytruda and creatinine of 1.69.

## 2022-04-04 NOTE — Progress Notes (Signed)
[facility] [phone]   Fax:([phone]) [place], [place] [address]  DIAGNOSIS: Stage IV non-small cell lung cancer, favored to be adenocarcinoma.  He presented with posterior right upper lobe nodule as well as right hilar, infrahilar, subcarinal, and right paratracheal lymphadenopathy.  He also has a 1.4 cm x 1 cm soft tissue nodule in the skin/subcutaneous fat in the left axilla.  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  He was diagnosed in August 2021   Molecular Biomarkers: BIOMARKER(S)         % CFDNA OR AMPLIFICATION       ASSOCIATED FDA-APPROVED THERAPIES        CLINICAL TRIAL AVAILABILITY [id number] 2.9% None    Yes [id number] 0.4% None    Yes [id number] 0.2% None    Yes   PRIOR THERAPY:  1) SRS to the small metastatic brain lesion under the care of Dr. [name] on 03/18/20 2) SBRT to the enlarging right upper lobe lung mass under the care of Dr. [name] completed on 04/06/2021.   CURRENT THERAPY:  1) Palliative systemic chemotherapy with carboplatin for an AUC of 5, Alimta 500 mg/m2, and Keytruda 200 mg IV every 3 weeks. First dose expected on 04/07/20.  Status post 33 cycles.  Starting from cycle #5 he will be on maintenance treatment with Alimta and Keytruda every 3 weeks.  For the last few cycles he is currently on maintenance treatment with single agent Keytruda. 2) SBRT to the enlarging right upper lobe lung mass under the care of Dr. [name].  INTERVAL HISTORY: Randy Andersen 66 y.o. male returns to the clinic today for follow-up visit.  The patient is feeling fine today with no concerning complaints.  He had swelling in his right lower extremity and he had Doppler at the [place] that was unremarkable for any blood clots.  He was diagnosed with varicose veins.  He denied having any chest pain, shortness of breath, cough or hemoptysis.  He has no nausea,  vomiting, diarrhea or constipation.  He has no headache or visual changes.  He has no recent weight loss or night sweats.  He is here today for evaluation before starting cycle #34.   MEDICAL HISTORY: Past Medical History:  Diagnosis Date   Asthma    as a child   Chronic pain disorder    LT leg and foot   Heart murmur    as a child   Hypertension    Malignant neoplasm of upper lobe of right lung ([place] [place])    Stroke ([facility])    mini stroke - 2015, some tingling in fingers on right     ALLERGIES:  has No Known Allergies.  MEDICATIONS:  Current Outpatient Medications  Medication Sig Dispense Refill   amLODipine (NORVASC) 10 MG tablet Take 10 mg by mouth daily.     Cholecalciferol 100 MCG (4000 UT) TABS Take 2 tablets by mouth daily.     dexamethasone (DECADRON) 1 MG tablet Take 1 tablet (1 mg total) by mouth daily. 60 tablet 1   diclofenac Sodium (VOLTAREN) 1 % GEL Apply 4 g topically 3 (three) times daily.     doxylamine, Sleep, (UNISOM) 25 MG tablet Take 1 tablet (25 mg total) by mouth at bedtime as needed for sleep. 30 tablet 0   HYDROcodone-acetaminophen (NORCO) 10-325 MG tablet Take 1 tablet by mouth 2 (two) times daily.  lidocaine (LIDODERM) 5 % Place 1 patch onto the skin daily. Remove & Discard patch within 12 hours or as directed by MD 15 patch 0   oxybutynin (DITROPAN-XL) 10 MG 24 hr tablet Take by mouth.     polyethylene glycol powder (GLYCOLAX/MIRALAX) 17 GM/SCOOP powder      potassium chloride SA (KLOR-CON M) 20 MEQ tablet Take 1 tablet (20 mEq total) by mouth daily. 5 tablet 0   prochlorperazine (COMPAZINE) 10 MG tablet Take 1 tablet (10 mg total) by mouth every 6 (six) hours as needed. 30 tablet 2   rivaroxaban (XARELTO) 20 MG TABS tablet TAKE 1 TABLET BY MOUTH ONCE A DAY WITH SUPPER 30 tablet 2   senna-docusate (SENOKOT-S) 8.6-50 MG tablet Take 2 tablets by mouth daily.     tadalafil (CIALIS) 20 MG tablet Take by mouth.     tamsulosin (FLOMAX) 0.4 MG CAPS capsule Take 1  capsule (0.4 mg total) by mouth daily. 30 capsule 2   No current facility-administered medications for this visit.    SURGICAL HISTORY:  Past Surgical History:  Procedure Laterality Date   BUNIONECTOMY Left    had hammer toe surgery also and callus removed   BUNIONECTOMY Right    also had hammer toe surgery and callus removed   LEG SURGERY Left    PT reports he has pins placed in his Lt leg   ORIF TIBIA PLATEAU  10/09/2012   Dr Lorin Mercy   ORIF TIBIA PLATEAU Left 10/08/2012   Procedure: OPEN REDUCTION INTERNAL FIXATION (ORIF) TIBIAL PLATEAU;  Surgeon: Marybelle Killings, MD;  Location: [facility];  Service: Orthopedics;  Laterality: Left;   VIDEO BRONCHOSCOPY WITH ENDOBRONCHIAL NAVIGATION N/A 02/25/2020   Procedure: VIDEO BRONCHOSCOPY WITH ENDOBRONCHIAL NAVIGATION with biopsies;  Surgeon: Collene Gobble, MD;  Location: [facility];  Service: Thoracic;  Laterality: N/A;   VIDEO BRONCHOSCOPY WITH ENDOBRONCHIAL ULTRASOUND N/A 02/25/2020   Procedure: VIDEO BRONCHOSCOPY WITH ENDOBRONCHIAL ULTRASOUND;  Surgeon: Collene Gobble, MD;  Location: [facility];  Service: Thoracic;  Laterality: N/A;    REVIEW OF SYSTEMS:  A comprehensive review of systems was negative except for: Constitutional: positive for fatigue   PHYSICAL EXAMINATION: General appearance: alert, cooperative, and no distress Head: Normocephalic, without obvious abnormality, atraumatic Neck: no adenopathy, no JVD, supple, symmetrical, trachea midline, and thyroid not enlarged, symmetric, no tenderness/mass/nodules Lymph nodes: Cervical, supraclavicular, and axillary nodes normal. Resp: clear to auscultation bilaterally Back: symmetric, no curvature. ROM normal. No CVA tenderness. Cardio: regular rate and rhythm, S1, S2 normal, no murmur, click, rub or gallop GI: soft, non-tender; bowel sounds normal; no masses,  no organomegaly Extremities: extremities normal, atraumatic, no cyanosis or edema  ECOG PERFORMANCE STATUS: 1 - Symptomatic but completely  ambulatory  Blood pressure (!) 157/87, pulse 80, temperature 98.1 F (36.7 C), temperature source Tympanic, resp. rate 18, height 6\' 2"  (1.88 m), weight 184 lb 12.8 oz (83.8 kg), SpO2 100 %.  LABORATORY DATA: Lab Results  Component Value Date   WBC 5.9 04/04/2022   HGB 13.9 04/04/2022   HCT 40.3 04/04/2022   MCV 87.0 04/04/2022   PLT 204 04/04/2022      Chemistry      Component Value Date/Time   NA 141 03/13/2022 1350   K 3.6 03/13/2022 1350   CL 112 (H) 03/13/2022 1350   CO2 24 03/13/2022 1350   BUN 18 03/13/2022 1350   CREATININE 1.59 (H) 03/13/2022 1350      Component Value Date/Time   CALCIUM 9.9 03/13/2022  1350   ALKPHOS 79 03/13/2022 1350   AST 12 (L) 03/13/2022 1350   ALT 10 03/13/2022 1350   BILITOT 0.4 03/13/2022 1350       RADIOGRAPHIC STUDIES: No results found.   ASSESSMENT AND PLAN: This is a very pleasant 66 years old African-American male diagnosed with stage IV non-small cell lung cancer, adenocarcinoma presented with right upper lobe nodule in addition to right hilar, infrahilar, subcarinal and right paratracheal adenopathy in addition to subcutaneous nodules in the left axilla and 2 small metastatic brain lesion diagnosed in August 2021.  The patient has no actionable mutations. He is currently undergoing systemic chemotherapy with carboplatin for AUC of 5, Alimta 500 mg/M2 and Keytruda 200 mg IV every 3 weeks.  Status post 33 cycles.  Starting from cycle #5 he will be on maintenance treatment with Alimta and Keytruda every 3 weeks.  I reduce the dose of his Alimta to 400 mg/M2 secondary to the renal insufficiency. The patient is currently on treatment with single agent Keytruda and Alimta was discontinued secondary to worsening renal insufficiency. He also underwent SBRT to the enlarging right upper lobe lung mass under the care of Dr. [name] completed in September 2022. The patient continues to tolerate his treatment with Keytruda fairly well with no  concerning adverse effects. I recommended for him to proceed with cycle #34 today. He will come back for follow-up visit in 3 weeks for evaluation before the last cycle of his treatment.  I will consider repeating the imaging studies after the last cycle of his treatment. For the cough the patient will continue on Delsym over-the-counter on as-needed basis. He was advised to call immediately if he has any other concerning symptoms in the interval. The patient voices understanding of current disease status and treatment options and is in agreement with the current care plan.  All questions were answered. The patient knows to call the clinic with any problems, questions or concerns. We can certainly see the patient much sooner if necessary.   Disclaimer: This note was dictated with voice recognition software. Similar sounding words can inadvertently be transcribed and may not be corrected upon review.

## 2022-04-04 NOTE — Patient Instructions (Signed)
[facility]  Discharge Instructions: Thank you for choosing [facility] to provide your oncology and hematology care.   If you have a lab appointment with the [facility], please go directly to the [facility] and check in at the registration area.   Wear comfortable clothing and clothing appropriate for easy access to any Portacath or PICC line.   We strive to give you quality time with your provider. You may need to reschedule your appointment if you arrive late (15 or more minutes).  Arriving late affects you and other patients whose appointments are after yours.  Also, if you miss three or more appointments without notifying the office, you may be dismissed from the clinic at the provider's discretion.      For prescription refill requests, have your pharmacy contact our office and allow 72 hours for refills to be completed.    Today you received the following chemotherapy and/or immunotherapy agents Randy Andersen      To help prevent nausea and vomiting after your treatment, we encourage you to take your nausea medication as directed.  BELOW ARE SYMPTOMS THAT SHOULD BE REPORTED IMMEDIATELY: *FEVER GREATER THAN 100.4 F (38 C) OR HIGHER *CHILLS OR SWEATING *NAUSEA AND VOMITING THAT IS NOT CONTROLLED WITH YOUR NAUSEA MEDICATION *UNUSUAL SHORTNESS OF BREATH *UNUSUAL BRUISING OR BLEEDING *URINARY PROBLEMS (pain or burning when urinating, or frequent urination) *BOWEL PROBLEMS (unusual diarrhea, constipation, pain near the anus) TENDERNESS IN MOUTH AND THROAT WITH OR WITHOUT PRESENCE OF ULCERS (sore throat, sores in mouth, or a toothache) UNUSUAL RASH, SWELLING OR PAIN  UNUSUAL VAGINAL DISCHARGE OR ITCHING   Items with * indicate a potential emergency and should be followed up as soon as possible or go to the Emergency Department if any problems should occur.  Please show the CHEMOTHERAPY ALERT CARD or IMMUNOTHERAPY ALERT CARD at check-in to  the Emergency Department and triage nurse.  Should you have questions after your visit or need to cancel or reschedule your appointment, please contact [facility]  Dept: [phone]  and follow the prompts.  Office hours are 8:00 a.m. to 4:30 p.m. Monday - Friday. Please note that voicemails left after 4:00 p.m. may not be returned until the following business day.  We are closed weekends and major holidays. You have access to a nurse at all times for urgent questions. Please call the main number to the clinic Dept: [phone] and follow the prompts.   For any non-urgent questions, you may also contact your provider using MyChart. We now offer e-Visits for anyone 73 and older to request care online for non-urgent symptoms. For details visit [email or web address].   Also download the MyChart app! Go to the app store, search "MyChart", open the app, select [facility], and log in with your MyChart username and password.  Masks are optional in the cancer centers. If you would like for your care team to wear a mask while they are taking care of you, please let them know. You may have one support person who is at least 67 years old accompany you for your appointments.

## 2022-04-20 ENCOUNTER — Ambulatory Visit: Payer: Medicare PPO | Admitting: Internal Medicine

## 2022-04-20 ENCOUNTER — Ambulatory Visit: Payer: Medicare PPO | Admitting: Physician Assistant

## 2022-04-20 ENCOUNTER — Other Ambulatory Visit: Payer: Self-pay

## 2022-04-20 ENCOUNTER — Other Ambulatory Visit: Payer: Medicare PPO

## 2022-04-20 ENCOUNTER — Ambulatory Visit: Payer: Medicare PPO

## 2022-04-20 ENCOUNTER — Ambulatory Visit (HOSPITAL_COMMUNITY)
Admission: EM | Admit: 2022-04-20 | Discharge: 2022-04-20 | Disposition: A | Discharge status: Home / Self Care | Payer: Medicare PPO | Attending: Family Medicine | Admitting: Family Medicine

## 2022-04-20 ENCOUNTER — Encounter (HOSPITAL_COMMUNITY): Payer: Self-pay | Admitting: Emergency Medicine

## 2022-04-20 DIAGNOSIS — M79661 Pain in right lower leg: Secondary | ICD-10-CM | POA: Diagnosis not present

## 2022-04-20 DIAGNOSIS — I10 Essential (primary) hypertension: Secondary | ICD-10-CM

## 2022-04-20 DIAGNOSIS — I739 Peripheral vascular disease, unspecified: Secondary | ICD-10-CM | POA: Diagnosis not present

## 2022-04-20 MED ORDER — OXYCODONE-ACETAMINOPHEN 5-325 MG PO TABS: 1.0000 | ORAL_TABLET | Freq: Four times a day (QID) | ORAL | 0 refills | Status: DC | PRN

## 2022-04-20 NOTE — ED Triage Notes (Signed)
Right leg pain, no injury.  Patient has been diagnosed with lung, cancer, brain tumor and thyroid .    When patient walks long distances, pain in knee and calf.  Reports having an xray at [place] and told this was not a blood clot.    Patient still uncertain what is causing this issue.

## 2022-04-20 NOTE — Discharge Instructions (Addendum)
Your blood pressure was noted to be elevated during your visit today. If you are currently taking medication for high blood pressure, please ensure you are taking this as directed. If you do not have a history of high blood pressure and your blood pressure remains persistently elevated, you may need to begin taking a medication at some point. You may return here within the next few days to recheck if unable to see your primary care provider or if you do not have a one.  BP (!) 164/101 (BP Location: Right Arm)   Pulse 78   Temp 97.9 F (36.6 C) (Oral)   Resp 20   SpO2 98%   BP Readings from Last 3 Encounters:  04/20/22 (!) 164/101  04/04/22 (!) 157/87  03/13/22 (!) 160/90

## 2022-04-20 NOTE — ED Provider Notes (Signed)
[facility]   [id number] 04/20/22 Arrival Time: 0539  ASSESSMENT & PLAN:  1. Pain in right lower leg   2. Claudication of right lower extremity (HCC)   3. Elevated blood pressure reading in office with diagnosis of hypertension    Suspect claudication; mild; quickly resolves with rest.  Orders Placed This Encounter  Procedures   Ambulatory referral to Vascular Surgery    Discharge Medication List as of 04/20/2022  5:09 PM     START taking these medications   Details  oxyCODONE-acetaminophen (PERCOCET/ROXICET) 5-325 MG tablet Take 1 tablet by mouth every 6 (six) hours as needed for severe pain., Starting Thu 04/20/2022, Normal       [place] Controlled Substances Registry consulted for this patient. I feel the risk/benefit ratio today is favorable for proceeding with this prescription for a controlled substance. Medication sedation precautions given.  Reviewed expectations re: course of current medical issues. Questions answered. Outlined signs and symptoms indicating need for more acute intervention. Patient verbalized understanding. After Visit Summary given.  SUBJECTIVE: History from: patient. Masayuki Sakai is a 66 y.o. male who reports gradual onset of R calf pain with exertion; x approx 4 weeks; quickly resolves with rest. Reports recent venous U/S of RLE through [place]; no DVT. No calf swelling. No rest pain or night symptoms. No extremity sensation changes. Does report RLE weakness associated with exertion also.   Past Medical History:  Diagnosis Date   Asthma    as a child   Chronic pain disorder    LT leg and foot   Heart murmur    as a child   Hypertension    Malignant neoplasm of upper lobe of right lung ([facility])    Stroke ([facility])    mini stroke - 2015, some tingling in fingers on right     Increased blood pressure noted today. Reports that he is treated for HTN. He reports no chest pain on exertion, no dyspnea on exertion, no orthostatic dizziness or  lightheadedness, no orthopnea or paroxysmal nocturnal dyspnea, and no palpitations.  OBJECTIVE:  Vitals:   04/20/22 1631  BP: (!) 164/101  Pulse: 78  Resp: 20  Temp: 97.9 F (36.6 C)  TempSrc: Oral  SpO2: 98%    General appearance: alert; no distress HEENT: [place]; AT Neck: supple with FROM Resp: unlabored respirations Extremities: RLE: warm with decreased hair growth; no localized tenderness over right calf; without gross deformities; swelling: none; bruising: none; knee and ankle ROM: normal CV: brisk extremity capillary refill of RLE; 1+ DP pulse of RLE. Skin: warm and dry; no visible rashes Neurologic: gait normal; normal sensation and strength of RLE Psychological: alert and cooperative; normal mood and affect   No Known Allergies  Past Medical History:  Diagnosis Date   Asthma    as a child   Chronic pain disorder    LT leg and foot   Heart murmur    as a child   Hypertension    Malignant neoplasm of upper lobe of right lung ([facility])    Stroke ([facility])    mini stroke - 2015, some tingling in fingers on right    Social History   Socioeconomic History   Marital status: Single    Spouse name: Not on file   Number of children: Not on file   Years of education: Not on file   Highest education level: Not on file  Occupational History   Not on file  Tobacco Use   Smoking status: Former  Packs/day: 2.00    Years: 28.00    Total pack years: 56.00    Types: Cigarettes   Smokeless tobacco: Never  Vaping Use   Vaping Use: Never used  Substance and Sexual Activity   Alcohol use: No   Drug use: No   Sexual activity: Not on file  Other Topics Concern   Not on file  Social History Narrative   Not on file   Social Determinants of Health   Financial Resource Strain: Medium Risk (03/11/2020)   Overall Financial Resource Strain (CARDIA)    Difficulty of Paying Living Expenses: Somewhat hard  Food Insecurity: No Food Insecurity (03/11/2020)   Hunger Vital Sign     Worried About Running Out of Food in the Last Year: Never true    Ran Out of Food in the Last Year: Never true  Transportation Needs: Unmet Transportation Needs (02/10/2022)   PRAPARE - Transportation    Lack of Transportation (Medical): Yes    Lack of Transportation (Non-Medical): Yes  Physical Activity: Insufficiently Active (03/11/2020)   Exercise Vital Sign    Days of Exercise per Week: 3 days    Minutes of Exercise per Session: 20 min  Stress: Not on file  Social Connections: Socially Isolated (03/11/2020)   Social Connection and Isolation Panel [NHANES]    Frequency of Communication with Friends and Family: Once a week    Frequency of Social Gatherings with Friends and Family: Once a week    Attends Religious Services: Never    Marine scientist or Organizations: No    Attends Music therapist: Never    Marital Status: Never married   Family History  Problem Relation Age of Onset   Cancer Mother        Intestinal metastatic to ovaries   Past Surgical History:  Procedure Laterality Date   BUNIONECTOMY Left    had hammer toe surgery also and callus removed   BUNIONECTOMY Right    also had hammer toe surgery and callus removed   LEG SURGERY Left    PT reports he has pins placed in his Lt leg   ORIF TIBIA PLATEAU  10/09/2012   Dr Lorin Mercy   ORIF TIBIA PLATEAU Left 10/08/2012   Procedure: OPEN REDUCTION INTERNAL FIXATION (ORIF) TIBIAL PLATEAU;  Surgeon: Marybelle Killings, MD;  Location: [facility];  Service: Orthopedics;  Laterality: Left;   VIDEO BRONCHOSCOPY WITH ENDOBRONCHIAL NAVIGATION N/A 02/25/2020   Procedure: VIDEO BRONCHOSCOPY WITH ENDOBRONCHIAL NAVIGATION with biopsies;  Surgeon: Collene Gobble, MD;  Location: [facility];  Service: Thoracic;  Laterality: N/A;   VIDEO BRONCHOSCOPY WITH ENDOBRONCHIAL ULTRASOUND N/A 02/25/2020   Procedure: VIDEO BRONCHOSCOPY WITH ENDOBRONCHIAL ULTRASOUND;  Surgeon: Collene Gobble, MD;  Location: [facility];  Service: Thoracic;  Laterality: Ginette Pitman, MD 04/20/22 1718

## 2022-04-25 ENCOUNTER — Encounter (HOSPITAL_COMMUNITY): Payer: Medicare PPO

## 2022-04-25 ENCOUNTER — Other Ambulatory Visit: Payer: Self-pay | Admitting: *Deleted

## 2022-04-25 DIAGNOSIS — I739 Peripheral vascular disease, unspecified: Secondary | ICD-10-CM

## 2022-04-25 NOTE — Progress Notes (Unsigned)
Randy [facility] OFFICE PROGRESS NOTE  Randy Bears, MD [address] Randy Andersen [id number]  DIAGNOSIS: Stage IV non-small cell lung cancer, favored to be adenocarcinoma.  He presented with posterior right upper lobe nodule as well as right hilar, infrahilar, subcarinal, and right paratracheal lymphadenopathy.  He also has a 1.4 cm x 1 cm soft tissue nodule in the skin/subcutaneous fat in the left axilla.  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  He was diagnosed in August 2021   Molecular Biomarkers: BIOMARKER(S)         % CFDNA OR AMPLIFICATION       ASSOCIATED FDA-APPROVED THERAPIES        CLINICAL TRIAL AVAILABILITY [id number] 2.9% None    Yes [id number] 0.4% None    Yes [id number] 0.2% None    Yes  PRIOR THERAPY: 1) SRS to the small metastatic brain lesion under the care of Dr. [name] on 03/18/20 2) Radiation to the enlarging right upper lobe lung mass under the care of Dr. [name]. Last dose on 04/06/21.   CURRENT THERAPY: Palliative systemic chemotherapy with carboplatin for an AUC of 5, Alimta 500 mg/m2, and Keytruda 200 mg IV every 3 weeks. First dose expected on 04/07/20. Status post 34 cycles. Starting from cycle #5 he will be on maintenance treatment with Alimta and Keytruda every 3 weeks. His dose of Alimta was reduced to 400 mg/m2. Alimta removed starting from cycle #11 due to renal insuffiencey.   INTERVAL HISTORY: Randy Andersen 66 y.o. male returns to the clinic today for a follow-up visit.  The patient is feeling fairly well today without any concerning complaints except when he was evaluated in early august 2023, he was endorsing right calf pain without swelling.  He was negative for DVT but the vascular technician unofficially called and reported decreased arterial blood flow.  Therefore, this was relayed to the patient's PCP Dr. Sherral Hammers, at the [facility] for consideration of referral to vascular surgery.   The patient presented to  the emergency room on 04/20/22 with claudication-like symptoms.  The patient was referred to vascular surgery and has an appointment on 05/10/22. He is on a blood thinner with Xarelto.    He is currently undergoing single agent immunotherapy with Keytruda.  He is tolerating it well without any concerning adverse side effects.  Alimta was ultimately discontinued from his treatment plan due to renal insufficiency.  The patient denies any new symptoms since last being seen.  Denies any recent fever, chills, or night sweats.  His weight is stable.  He is periodically given complementary ensures due to financial constraints as well as received a [employer] of food which has helped him gain weight.  He was also previously evaluated by member the nutritionist team.  He denies any changes with his breathing except slightly worsening cough. He reports stable dyspnea on exertion. His cough produces clear phlegm. We monitor his scans closely due to possible pneumonitis.  He denies any nausea, vomiting, diarrhea, or constipation. Denies any headache or visual changes.  He also follows closely with Dr. Mickeal Skinner for his history of metastatic disease to the brain. His blood pressure is a little elevated today because he has not taken his morning BP meds yet. He is asymptomatic. He is here today for evaluation and repeat blood work before undergoing cycle #35.      MEDICAL HISTORY: Past Medical History:  Diagnosis Date   Asthma    as a  child   Chronic pain disorder    LT leg and foot   Heart murmur    as a child   Hypertension    Malignant neoplasm of upper lobe of right lung (HCC)    Stroke ([place])    mini stroke - 2015, some tingling in fingers on right     ALLERGIES:  has No Known Allergies.  MEDICATIONS:  Current Outpatient Medications  Medication Sig Dispense Refill   amLODipine (NORVASC) 10 MG tablet Take 10 mg by mouth daily.     Cholecalciferol 100 MCG (4000 UT) TABS Take 2 tablets by mouth daily.      dexamethasone (DECADRON) 1 MG tablet Take 1 tablet (1 mg total) by mouth daily. 60 tablet 1   diclofenac Sodium (VOLTAREN) 1 % GEL Apply 4 g topically 3 (three) times daily.     doxylamine, Sleep, (UNISOM) 25 MG tablet Take 1 tablet (25 mg total) by mouth at bedtime as needed for sleep. 30 tablet 0   HYDROcodone-acetaminophen (NORCO) 10-325 MG tablet Take 1 tablet by mouth 2 (two) times daily.     lidocaine (LIDODERM) 5 % Place 1 patch onto the skin daily. Remove & Discard patch within 12 hours or as directed by MD 15 patch 0   oxybutynin (DITROPAN-XL) 10 MG 24 hr tablet Take by mouth.     oxyCODONE-acetaminophen (PERCOCET/ROXICET) 5-325 MG tablet Take 1 tablet by mouth every 6 (six) hours as needed for severe pain. 12 tablet 0   polyethylene glycol powder (GLYCOLAX/MIRALAX) 17 GM/SCOOP powder      potassium chloride SA (KLOR-CON M) 20 MEQ tablet Take 1 tablet (20 mEq total) by mouth daily. 5 tablet 0   prochlorperazine (COMPAZINE) 10 MG tablet Take 1 tablet (10 mg total) by mouth every 6 (six) hours as needed. 30 tablet 2   rivaroxaban (XARELTO) 20 MG TABS tablet TAKE 1 TABLET BY MOUTH ONCE A DAY WITH SUPPER 30 tablet 2   senna-docusate (SENOKOT-S) 8.6-50 MG tablet Take 2 tablets by mouth daily.     tadalafil (CIALIS) 20 MG tablet Take by mouth.     tamsulosin (FLOMAX) 0.4 MG CAPS capsule Take 1 capsule (0.4 mg total) by mouth daily. 30 capsule 2   No current facility-administered medications for this visit.    SURGICAL HISTORY:  Past Surgical History:  Procedure Laterality Date   BUNIONECTOMY Left    had hammer toe surgery also and callus removed   BUNIONECTOMY Right    also had hammer toe surgery and callus removed   LEG SURGERY Left    PT reports he has pins placed in his Lt leg   ORIF TIBIA PLATEAU  10/09/2012   Dr Lorin Mercy   ORIF TIBIA PLATEAU Left 10/08/2012   Procedure: OPEN REDUCTION INTERNAL FIXATION (ORIF) TIBIAL PLATEAU;  Surgeon: Marybelle Killings, MD;  Location: [place];  Service:  Orthopedics;  Laterality: Left;   VIDEO BRONCHOSCOPY WITH ENDOBRONCHIAL NAVIGATION N/A 02/25/2020   Procedure: VIDEO BRONCHOSCOPY WITH ENDOBRONCHIAL NAVIGATION with biopsies;  Surgeon: Collene Gobble, MD;  Location: [place];  Service: Thoracic;  Laterality: N/A;   VIDEO BRONCHOSCOPY WITH ENDOBRONCHIAL ULTRASOUND N/A 02/25/2020   Procedure: VIDEO BRONCHOSCOPY WITH ENDOBRONCHIAL ULTRASOUND;  Surgeon: Collene Gobble, MD;  Location: [facility];  Service: Thoracic;  Laterality: N/A;    REVIEW OF SYSTEMS:   Constitutional: Negative for appetite change, chills, fatigue, fever and unexpected weight change.  HENT: Negative for mouth sores, nosebleeds, sore throat and trouble swallowing.   Eyes: Negative  for eye problems and icterus.  Respiratory: Positive for cough. Negative for hemoptysis, shortness of breath and wheezing.   Cardiovascular: Negative for chest pain and leg swelling.  Gastrointestinal: Negative for abdominal pain, constipation, diarrhea, nausea and vomiting.  Genitourinary: Negative for bladder incontinence, difficulty urinating, dysuria, frequency and hematuria.   Musculoskeletal: Positive for lower extremity calf pain with ambulation. Negative for back pain, gait problem, neck pain and neck stiffness.  Skin: Negative for itching and rash.  Neurological: Negative for dizziness, extremity weakness, gait problem, headaches, light-headedness and seizures.  Hematological: Negative for adenopathy. Does not bruise/bleed easily.  Psychiatric/Behavioral: Negative for confusion, depression and sleep disturbance. The patient is not nervous/anxious.    PHYSICAL EXAMINATION:  Blood pressure (!) 162/102, pulse 82, temperature 97.6 F (36.4 C), temperature source Oral, resp. rate 16, weight 184 lb 8 oz (83.7 kg), SpO2 100 %.  ECOG PERFORMANCE STATUS: 1  Physical Exam  Constitutional: Oriented to person, place, and time and well-developed, well-nourished, and in no distress.  HENT:  Head:  Normocephalic and atraumatic.  Mouth/Throat: Oropharynx is clear and moist. No oropharyngeal exudate.  Eyes: Conjunctivae are normal. Right eye exhibits no discharge. Left eye exhibits no discharge. No scleral icterus.  Neck: Normal range of motion. Neck supple.  Cardiovascular: Normal rate, regular rhythm, normal heart sounds and intact distal pulses.   Pulmonary/Chest: Effort normal and breath sounds normal. No respiratory distress. No wheezes. No rales.  Abdominal: Soft. Bowel sounds are normal. Exhibits no distension and no mass. There is no tenderness.  Musculoskeletal: Normal range of motion. Exhibits no edema.  Lymphadenopathy:    No cervical adenopathy.  Neurological: Alert and oriented to person, place, and time. Exhibits normal muscle tone. Gait normal. Coordination normal.  Skin: Skin is warm and dry. No rash noted. Not diaphoretic. No erythema. No pallor.  Psychiatric: Mood, memory and judgment normal.  Vitals reviewed.    LABORATORY DATA: Lab Results  Component Value Date   WBC 6.3 04/27/2022   HGB 14.9 04/27/2022   HCT 43.7 04/27/2022   MCV 87.4 04/27/2022   PLT 229 04/27/2022      Chemistry      Component Value Date/Time   NA 139 04/27/2022 1013   K 3.6 04/27/2022 1013   CL 105 04/27/2022 1013   CO2 24 04/27/2022 1013   BUN 18 04/27/2022 1013   CREATININE 1.62 (H) 04/27/2022 1013      Component Value Date/Time   CALCIUM 9.8 04/27/2022 1013   ALKPHOS 99 04/27/2022 1013   AST 14 (L) 04/27/2022 1013   ALT 11 04/27/2022 1013   BILITOT 0.5 04/27/2022 1013       RADIOGRAPHIC STUDIES:  No results found.   ASSESSMENT/PLAN:  This is a very pleasant 66 year old African-American male with stage IV non-small cell lung cancer, favored to be adenocarcinoma.  He presented with posterior right upper lobe nodule as well as right hilar, infrahilar, subcarinal, and right paratracheal lymphadenopathy.  He also has a 1.4 cm x 1 cm soft tissue nodule in the  skin/subcutaneous fat in the left axilla.  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  He was diagnosed in August 2021.  Molecular studies by [facility] are negative for any actionable mutations.   The patient completed SRS to the small metastatic brain lesion under the care of Dr. [name] on 03/18/20   The patient is currently undergoing palliative systemic chemotherapy with carboplatin for an AUC of 5, Alimta 500 mg per  metered squared, and Keytruda 200 mg IV every 3 weeks.  He is status post 32 cycles. The dose of alimta was reduced to 400 mg/m2 due to renal insufficiency. Alimta was ultimately removed from the treatment plan with cycle #11 due to renal insufficiency   The patient completed SBRT to the enlarging right upper lobe lung mass under the care of Dr. [name] on 04/06/21.    Labs were reviewed.  Recommend that he proceed with cycle #35 today scheduled. He is ok to treat with his creatinine of 1.62 today.   We will arrange for restaging CT scan of the chest, abdomen, and pelvis before his next appointment. I will order this without contrast due to his renal insuffiencey.   We will see the patient back in approximately 3 weeks to review his scan.  If the patient does not have any evidence of disease progression.  We have a recommend the patient continue on observation with close monitoring.  Encouraged him to keep his appointment vascular surgery on 10/18 as scheduled.  Patient's blood pressure is elevated today.  He is asymptomatic.  He forgot to take his blood pressure medication this morning.  He will take it upon returning home today.  We will recheck his blood pressure in the infusion room.   We will continue to follow closely with neuro oncology for his history of metastatic disease to the brain  The patient was advised to call immediately if he has any concerning symptoms in the interval. The patient voices understanding of current disease status and  treatment options and is in agreement with the current care plan. All questions were answered. The patient knows to call the clinic with any problems, questions or concerns. We can certainly see the patient much sooner if necessary       Orders Placed This Encounter  Procedures   CT Chest Wo Contrast    Standing Status:   Future    Standing Expiration Date:   04/27/2023    Order Specific Question:   Preferred imaging location?    Answer:   [facility]   CT Abdomen Pelvis Wo Contrast    Standing Status:   Future    Standing Expiration Date:   04/27/2023    Order Specific Question:   Preferred imaging location?    Answer:   [facility]    Order Specific Question:   Is Oral Contrast requested for this exam?    Answer:   Yes, Per Radiology protocol     The total time spent in the appointment was 20-29 minutes.   Berlyn Saylor L Kaelea Gathright, PA-C 04/27/22

## 2022-04-26 ENCOUNTER — Encounter: Payer: Medicare PPO | Admitting: Vascular Surgery

## 2022-04-27 ENCOUNTER — Encounter: Payer: Self-pay | Admitting: Physician Assistant

## 2022-04-27 ENCOUNTER — Other Ambulatory Visit: Payer: Self-pay

## 2022-04-27 ENCOUNTER — Encounter: Payer: Self-pay | Admitting: Nutrition

## 2022-04-27 ENCOUNTER — Inpatient Hospital Stay: Payer: No Typology Code available for payment source | Attending: Internal Medicine

## 2022-04-27 ENCOUNTER — Inpatient Hospital Stay (HOSPITAL_BASED_OUTPATIENT_CLINIC_OR_DEPARTMENT_OTHER): Payer: No Typology Code available for payment source | Admitting: Physician Assistant

## 2022-04-27 ENCOUNTER — Inpatient Hospital Stay: Payer: No Typology Code available for payment source

## 2022-04-27 VITALS — BP 155/92 | HR 79 | Resp 16

## 2022-04-27 VITALS — BP 162/102 | HR 82 | Temp 97.6°F | Resp 16 | Wt 184.5 lb

## 2022-04-27 DIAGNOSIS — Z79899 Other long term (current) drug therapy: Secondary | ICD-10-CM | POA: Insufficient documentation

## 2022-04-27 DIAGNOSIS — C3411 Malignant neoplasm of upper lobe, right bronchus or lung: Secondary | ICD-10-CM | POA: Diagnosis not present

## 2022-04-27 DIAGNOSIS — Z5112 Encounter for antineoplastic immunotherapy: Secondary | ICD-10-CM | POA: Insufficient documentation

## 2022-04-27 DIAGNOSIS — C7931 Secondary malignant neoplasm of brain: Secondary | ICD-10-CM | POA: Diagnosis not present

## 2022-04-27 LAB — CBC WITH DIFFERENTIAL (CANCER CENTER ONLY)
Abs Immature Granulocytes: 0.02 10*3/uL (ref 0.00–0.07)
Basophils Absolute: 0 10*3/uL (ref 0.0–0.1)
Basophils Relative: 1 %
Eosinophils Absolute: 0.1 10*3/uL (ref 0.0–0.5)
Eosinophils Relative: 1 %
HCT: 43.7 % (ref 39.0–52.0)
Hemoglobin: 14.9 g/dL (ref 13.0–17.0)
Immature Granulocytes: 0 %
Lymphocytes Relative: 18 %
Lymphs Abs: 1.1 10*3/uL (ref 0.7–4.0)
MCH: 29.8 pg (ref 26.0–34.0)
MCHC: 34.1 g/dL (ref 30.0–36.0)
MCV: 87.4 fL (ref 80.0–100.0)
Monocytes Absolute: 0.3 10*3/uL (ref 0.1–1.0)
Monocytes Relative: 4 %
Neutro Abs: 4.8 10*3/uL (ref 1.7–7.7)
Neutrophils Relative %: 76 %
Platelet Count: 229 10*3/uL (ref 150–400)
RBC: 5 MIL/uL (ref 4.22–5.81)
RDW: 13.6 % (ref 11.5–15.5)
WBC Count: 6.3 10*3/uL (ref 4.0–10.5)
nRBC: 0 % (ref 0.0–0.2)

## 2022-04-27 LAB — TSH: TSH: 0.946 u[IU]/mL (ref 0.350–4.500)

## 2022-04-27 LAB — CMP (CANCER CENTER ONLY)
ALT: 11 U/L (ref 0–44)
AST: 14 U/L — ABNORMAL LOW (ref 15–41)
Albumin: 4.4 g/dL (ref 3.5–5.0)
Alkaline Phosphatase: 99 U/L (ref 38–126)
Anion gap: 10 (ref 5–15)
BUN: 18 mg/dL (ref 8–23)
CO2: 24 mmol/L (ref 22–32)
Calcium: 9.8 mg/dL (ref 8.9–10.3)
Chloride: 105 mmol/L (ref 98–111)
Creatinine: 1.62 mg/dL — ABNORMAL HIGH (ref 0.61–1.24)
GFR, Estimated: 46 mL/min — ABNORMAL LOW (ref >60)
Glucose, Bld: 203 mg/dL — ABNORMAL HIGH (ref 70–99)
Potassium: 3.6 mmol/L (ref 3.5–5.1)
Sodium: 139 mmol/L (ref 135–145)
Total Bilirubin: 0.5 mg/dL (ref 0.3–1.2)
Total Protein: 8.3 g/dL — ABNORMAL HIGH (ref 6.5–8.1)

## 2022-04-27 MED ORDER — SODIUM CHLORIDE 0.9 % IV SOLN
200.0000 mg | Freq: Once | INTRAVENOUS | Status: AC
  Administered 2022-04-27: 200 mg via INTRAVENOUS
  Filled 2022-04-27: qty 200

## 2022-04-27 MED ORDER — SODIUM CHLORIDE 0.9 % IV SOLN: Freq: Once | INTRAVENOUS | Status: AC

## 2022-04-27 NOTE — Progress Notes (Signed)
Per [name] H, PA-C, ok to treat with Scr 1.62 today and with elevated BP.

## 2022-04-27 NOTE — Progress Notes (Signed)
Provided one complimentary case of ensure plus HP. 

## 2022-05-01 ENCOUNTER — Telehealth: Payer: Self-pay | Admitting: Physician Assistant

## 2022-05-01 NOTE — Telephone Encounter (Signed)
Scheduled per 10/05 los, patient has been called and notified of upcoming appointments.

## 2022-05-10 ENCOUNTER — Encounter: Payer: Self-pay | Admitting: Vascular Surgery

## 2022-05-10 ENCOUNTER — Other Ambulatory Visit: Payer: Self-pay

## 2022-05-10 ENCOUNTER — Ambulatory Visit (HOSPITAL_COMMUNITY)
Admission: RE | Admit: 2022-05-10 | Discharge: 2022-05-10 | Disposition: A | Discharge status: Home / Self Care | Payer: Medicare PPO | Source: Ambulatory Visit | Attending: Vascular Surgery | Admitting: Vascular Surgery

## 2022-05-10 ENCOUNTER — Ambulatory Visit (INDEPENDENT_AMBULATORY_CARE_PROVIDER_SITE_OTHER): Payer: Medicare PPO | Admitting: Vascular Surgery

## 2022-05-10 VITALS — BP 153/91 | HR 74 | Temp 98.2°F | Resp 20 | Ht 74.0 in | Wt 186.0 lb

## 2022-05-10 DIAGNOSIS — I739 Peripheral vascular disease, unspecified: Secondary | ICD-10-CM

## 2022-05-10 NOTE — Progress Notes (Signed)
Patient ID: Randy Andersen, male   DOB: June 16, 1956, 66 y.o.   MRN: [id number]  Reason for Consult: New Patient (Initial Visit)   Referred by [name], MD  Subjective:     HPI:  Randy Andersen is a 66 y.o. male with long history of smoking currently on Xarelto for lung cancer but had his last treatment this week.  No previous vascular disease.  Over the past 4 weeks he has developed claudication in his right lower extremity with cramping after about 10 blocks.  He denies any tissue loss or ulceration.  States that he has never had a stroke or mini stroke and denies any personal or family history of aneurysm disease.  He does not have any tissue loss or ulceration of his feet.  Past Medical History:  Diagnosis Date   Asthma    as a child   Chronic pain disorder    LT leg and foot   Heart murmur    as a child   Hypertension    Malignant neoplasm of upper lobe of right lung ([place])    Stroke ([place])    mini stroke - 2015, some tingling in fingers on right    Family History  Problem Relation Age of Onset   Cancer Mother        Intestinal metastatic to ovaries   Past Surgical History:  Procedure Laterality Date   BUNIONECTOMY Left    had hammer toe surgery also and callus removed   BUNIONECTOMY Right    also had hammer toe surgery and callus removed   LEG SURGERY Left    PT reports he has pins placed in his Lt leg   ORIF TIBIA PLATEAU  10/09/2012   Dr Lorin Mercy   ORIF TIBIA PLATEAU Left 10/08/2012   Procedure: OPEN REDUCTION INTERNAL FIXATION (ORIF) TIBIAL PLATEAU;  Surgeon: Marybelle Killings, MD;  Location: [place];  Service: Orthopedics;  Laterality: Left;   VIDEO BRONCHOSCOPY WITH ENDOBRONCHIAL NAVIGATION N/A 02/25/2020   Procedure: VIDEO BRONCHOSCOPY WITH ENDOBRONCHIAL NAVIGATION with biopsies;  Surgeon: Collene Gobble, MD;  Location: [facility];  Service: Thoracic;  Laterality: N/A;   VIDEO BRONCHOSCOPY WITH ENDOBRONCHIAL ULTRASOUND N/A 02/25/2020   Procedure: VIDEO BRONCHOSCOPY WITH  ENDOBRONCHIAL ULTRASOUND;  Surgeon: Collene Gobble, MD;  Location: [facility];  Service: Thoracic;  Laterality: N/A;    Short Social History:  Social History   Tobacco Use   Smoking status: Former    Packs/day: 2.00    Years: 28.00    Total pack years: 56.00    Types: Cigarettes   Smokeless tobacco: Never  Substance Use Topics   Alcohol use: No    No Known Allergies  Current Outpatient Medications  Medication Sig Dispense Refill   amLODipine (NORVASC) 10 MG tablet Take 10 mg by mouth daily.     Cholecalciferol 100 MCG (4000 UT) TABS Take 2 tablets by mouth daily.     dexamethasone (DECADRON) 1 MG tablet Take 1 tablet (1 mg total) by mouth daily. 60 tablet 1   diclofenac Sodium (VOLTAREN) 1 % GEL Apply 4 g topically 3 (three) times daily.     doxylamine, Sleep, (UNISOM) 25 MG tablet Take 1 tablet (25 mg total) by mouth at bedtime as needed for sleep. 30 tablet 0   HYDROcodone-acetaminophen (NORCO) 10-325 MG tablet Take 1 tablet by mouth 2 (two) times daily.     lidocaine (LIDODERM) 5 % Place 1 patch onto the skin daily. Remove & Discard patch within 12 hours  or as directed by MD 15 patch 0   oxybutynin (DITROPAN-XL) 10 MG 24 hr tablet Take by mouth.     oxyCODONE-acetaminophen (PERCOCET/ROXICET) 5-325 MG tablet Take 1 tablet by mouth every 6 (six) hours as needed for severe pain. 12 tablet 0   polyethylene glycol powder (GLYCOLAX/MIRALAX) 17 GM/SCOOP powder      potassium chloride SA (KLOR-CON M) 20 MEQ tablet Take 1 tablet (20 mEq total) by mouth daily. 5 tablet 0   prochlorperazine (COMPAZINE) 10 MG tablet Take 1 tablet (10 mg total) by mouth every 6 (six) hours as needed. 30 tablet 2   rivaroxaban (XARELTO) 20 MG TABS tablet TAKE 1 TABLET BY MOUTH ONCE A DAY WITH SUPPER 30 tablet 2   senna-docusate (SENOKOT-S) 8.6-50 MG tablet Take 2 tablets by mouth daily.     tadalafil (CIALIS) 20 MG tablet Take by mouth.     tamsulosin (FLOMAX) 0.4 MG CAPS capsule Take 1 capsule (0.4 mg total) by  mouth daily. 30 capsule 2   No current facility-administered medications for this visit.    Review of Systems  Constitutional:  Constitutional negative. HENT: HENT negative.  Eyes: Eyes negative.  Respiratory: Respiratory negative.  Cardiovascular: Positive for claudication.  GI: Gastrointestinal negative.  Musculoskeletal: Musculoskeletal negative.  Skin: Skin negative.  Neurological: Neurological negative. Hematologic: Hematologic/lymphatic negative.  Psychiatric: Psychiatric negative.        Objective:  Objective   Vitals:   05/10/22 1516  BP: (!) 153/91  Pulse: 74  Resp: 20  Temp: 98.2 F (36.8 C)  SpO2: 98%   Physical Exam HENT:     Head: Normocephalic.     Nose: Nose normal.  Eyes:     Pupils: Pupils are equal, round, and reactive to light.  Cardiovascular:     Rate and Rhythm: Normal rate.     Pulses:          Femoral pulses are 2+ on the right side and 2+ on the left side.      Popliteal pulses are 0 on the right side.       Posterior tibial pulses are 0 on the right side and 2+ on the left side.  Abdominal:     General: Abdomen is flat.     Palpations: Abdomen is soft.  Musculoskeletal:        General: Normal range of motion.  Skin:    General: Skin is warm and dry.     Capillary Refill: Capillary refill takes 2 to 3 seconds.  Neurological:     General: No focal deficit present.     Mental Status: He is alert.  Psychiatric:        Mood and Affect: Mood normal.     Data: ABI Findings:  +---------+------------------+-----+----------+--------+  Right    Rt Pressure (mmHg)IndexWaveform  Comment   +---------+------------------+-----+----------+--------+  Brachial 156                                        +---------+------------------+-----+----------+--------+  PTA      78                0.50 monophasic          +---------+------------------+-----+----------+--------+  DP       86                0.55 monophasic           +---------+------------------+-----+----------+--------+  Great Toe75                0.48 Abnormal            +---------+------------------+-----+----------+--------+   +---------+------------------+-----+---------+-------+  Left     Lt Pressure (mmHg)IndexWaveform Comment  +---------+------------------+-----+---------+-------+  Brachial 156                                      +---------+------------------+-----+---------+-------+  PTA      198               1.27 triphasic         +---------+------------------+-----+---------+-------+  DP       175               1.12 biphasic          +---------+------------------+-----+---------+-------+  Great Toe135               0.87 Normal            +---------+------------------+-----+---------+-------+   +-------+-----------+-----------+------------+------------+  ABI/TBIToday's ABIToday's TBIPrevious ABIPrevious TBI  +-------+-----------+-----------+------------+------------+  Right  0.55       0.48                                 +-------+-----------+-----------+------------+------------+  Left   1.27       0.87                                 +-------+-----------+-----------+------------+------------+           Summary:  Right: Resting right ankle-brachial index indicates moderate right lower  extremity arterial disease. The right toe-brachial index is abnormal.   Left: Resting left ankle-brachial index is within normal range. The left  toe-brachial index is normal.      Assessment/Plan:    66 year old male with right lower extremity claudication and ABIs that substantiate his level of symptoms.  I have recommended smoking cessation as well as aspirin alongside his Xarelto and continue walking is much as possible.  Patient is in agreement with this and will follow-up in 6 months with right lower extremity duplex and ABIs.  Certainly if his symptoms severely worsen we can consider  aortogram with possible treatment of the right lower extremity but I am concerned as long as he is smoking this will have limited long-term utility and I discussed this with him today.  He demonstrates good understanding and I will see him in 6 months.     Waynetta Sandy MD [facility]

## 2022-05-16 NOTE — Progress Notes (Signed)
Randy Andersen OFFICE PROGRESS NOTE  Windy Fast, MD [address] [place] [address]  DIAGNOSIS:  Stage IV non-small cell lung cancer, favored to be adenocarcinoma.  He presented with posterior right upper lobe nodule as well as right hilar, infrahilar, subcarinal, and right paratracheal lymphadenopathy.  He also has a 1.4 cm x 1 cm soft tissue nodule in the skin/subcutaneous fat in the left axilla.  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  He was diagnosed in August 2021   Molecular Biomarkers: BIOMARKER(S)         % CFDNA OR AMPLIFICATION       ASSOCIATED FDA-APPROVED THERAPIES        CLINICAL TRIAL AVAILABILITY [id number] 2.9% None    Yes [id number] 0.4% None    Yes [id number] 0.2% None    Yes  PRIOR THERAPY: 1) SRS to the small metastatic brain lesion under the care of Dr. [name] on 03/18/20 2) Radiation to the enlarging right upper lobe lung mass under the care of Dr. [name]. Last dose on 04/06/21.  3)   Palliative systemic chemotherapy with carboplatin for an AUC of 5, Alimta 500 mg/m2, and Keytruda 200 mg IV every 3 weeks. Last dose on 04/27/22. Status post 35 cycles. Starting from cycle #5 he will be on maintenance treatment with Alimta and Keytruda every 3 weeks. His dose of Alimta was reduced to 400 mg/m2. Alimta removed starting from cycle #11 due to renal insuffiencey.   CURRENT THERAPY: Observation   INTERVAL HISTORY: Randy Andersen 66 y.o. male returns to the clinic today for a follow-up visit.  The patient was last seen 3 weeks ago.  The patient completed 2 years of immunotherapy with Keytruda and he tolerated it well.  Alimta was ultimately discontinued from his treatment plan due to renal insufficiency.  Towards the end of his treatment, the patient had been struggling with claudication.  He establish care with a vascular surgeon were planning on performing lifestyle modifications and close observation with a follow-up in 6 months.  The patient mentions today that he knows he needs to quit smoking. He is currently smoking about 10 cigarettes per day. He seems more motivated at this time to try to quit. He would be open to trying NRT.    Since last being seen, the patient denies any changes in his health.  Denies any fever, chills, night sweats, or unexplained weight loss. The patient denies any new symptoms since last being seen.  Denies any recent fever, chills, or night sweats.  His weight is stable.  He is periodically given complementary ensures due to financial constraints as well as a Animator of food which has helped him gain weight.  He was also previously evaluated by member the nutritionist team.  He denies any changes with his breathing. Reports stable dyspnea on exertion. His cough produces clear phlegm which is stable.  He denies any nausea, vomiting, diarrhea, or constipation. Denies any headache or visual changes.  He also follows closely with Dr. Mickeal Skinner for his history of metastatic disease to the brain. He recently had a restaging CT scan performed. He is here today for evaluation and to review his scan results.    MEDICAL HISTORY: Past Medical History:  Diagnosis Date   Asthma    as a child   Chronic pain disorder    LT leg and foot   Heart murmur    as a child   Hypertension  Malignant neoplasm of upper lobe of right lung (HCC)    Stroke ([place])    mini stroke - 2015, some tingling in fingers on right     ALLERGIES:  has No Known Allergies.  MEDICATIONS:  Current Outpatient Medications  Medication Sig Dispense Refill   nicotine (NICODERM CQ) 21 mg/24hr patch Place 1 patch (21 mg total) onto the skin daily. 28 patch 2   nicotine polacrilex (COMMIT) 2 MG lozenge Take 1 lozenge (2 mg total) by mouth as needed for smoking cessation. 100 tablet 0   amLODipine (NORVASC) 10 MG tablet Take 10 mg by mouth daily.     Cholecalciferol 100 MCG (4000 UT) TABS Take 2 tablets by mouth daily.     dexamethasone  (DECADRON) 1 MG tablet Take 1 tablet (1 mg total) by mouth daily. 60 tablet 1   diclofenac Sodium (VOLTAREN) 1 % GEL Apply 4 g topically 3 (three) times daily.     doxylamine, Sleep, (UNISOM) 25 MG tablet Take 1 tablet (25 mg total) by mouth at bedtime as needed for sleep. 30 tablet 0   HYDROcodone-acetaminophen (NORCO) 10-325 MG tablet Take 1 tablet by mouth 2 (two) times daily.     lidocaine (LIDODERM) 5 % Place 1 patch onto the skin daily. Remove & Discard patch within 12 hours or as directed by MD 15 patch 0   oxybutynin (DITROPAN-XL) 10 MG 24 hr tablet Take by mouth.     oxyCODONE-acetaminophen (PERCOCET/ROXICET) 5-325 MG tablet Take 1 tablet by mouth every 6 (six) hours as needed for severe pain. 12 tablet 0   polyethylene glycol powder (GLYCOLAX/MIRALAX) 17 GM/SCOOP powder      potassium chloride SA (KLOR-CON M) 20 MEQ tablet Take 1 tablet (20 mEq total) by mouth daily. 5 tablet 0   prochlorperazine (COMPAZINE) 10 MG tablet Take 1 tablet (10 mg total) by mouth every 6 (six) hours as needed. 30 tablet 2   rivaroxaban (XARELTO) 20 MG TABS tablet TAKE 1 TABLET BY MOUTH ONCE A DAY WITH SUPPER 30 tablet 2   senna-docusate (SENOKOT-S) 8.6-50 MG tablet Take 2 tablets by mouth daily.     tadalafil (CIALIS) 20 MG tablet Take by mouth.     tamsulosin (FLOMAX) 0.4 MG CAPS capsule Take 1 capsule (0.4 mg total) by mouth daily. 30 capsule 2   No current facility-administered medications for this visit.    SURGICAL HISTORY:  Past Surgical History:  Procedure Laterality Date   BUNIONECTOMY Left    had hammer toe surgery also and callus removed   BUNIONECTOMY Right    also had hammer toe surgery and callus removed   LEG SURGERY Left    PT reports he has pins placed in his Lt leg   ORIF TIBIA PLATEAU  10/09/2012   Dr Lorin Mercy   ORIF TIBIA PLATEAU Left 10/08/2012   Procedure: OPEN REDUCTION INTERNAL FIXATION (ORIF) TIBIAL PLATEAU;  Surgeon: Marybelle Killings, MD;  Location: [facility];  Service: Orthopedics;   Laterality: Left;   VIDEO BRONCHOSCOPY WITH ENDOBRONCHIAL NAVIGATION N/A 02/25/2020   Procedure: VIDEO BRONCHOSCOPY WITH ENDOBRONCHIAL NAVIGATION with biopsies;  Surgeon: Collene Gobble, MD;  Location: [facility];  Service: Thoracic;  Laterality: N/A;   VIDEO BRONCHOSCOPY WITH ENDOBRONCHIAL ULTRASOUND N/A 02/25/2020   Procedure: VIDEO BRONCHOSCOPY WITH ENDOBRONCHIAL ULTRASOUND;  Surgeon: Collene Gobble, MD;  Location: [facility];  Service: Thoracic;  Laterality: N/A;    REVIEW OF SYSTEMS:   Review of Systems  Constitutional: Negative for appetite change, chills, fatigue, fever  and unexpected weight change.  HENT:   Negative for mouth sores, nosebleeds, sore throat and trouble swallowing.   Eyes: Negative for eye problems and icterus.  Respiratory: Positive for cough. Negative for hemoptysis, shortness of breath and wheezing.   Cardiovascular: Negative for chest pain and leg swelling.  Gastrointestinal: Negative for abdominal pain, constipation, diarrhea, nausea and vomiting.  Genitourinary: Negative for bladder incontinence, difficulty urinating, dysuria, frequency and hematuria.   Musculoskeletal: Negative for back pain, gait problem, neck pain and neck stiffness.  Skin: Negative for itching and rash.  Neurological: Negative for dizziness, extremity weakness, gait problem, headaches, light-headedness and seizures.  Hematological: Negative for adenopathy. Does not bruise/bleed easily.  Psychiatric/Behavioral: Negative for confusion, depression and sleep disturbance. The patient is not nervous/anxious.     PHYSICAL EXAMINATION:  Blood pressure (!) 150/86, pulse 80, temperature 99.1 F (37.3 C), temperature source Oral, resp. rate 15, weight 187 lb 1.6 oz (84.9 kg), SpO2 100 %.  ECOG PERFORMANCE STATUS: 1  Physical Exam  Constitutional: Oriented to person, place, and time and well-developed, well-nourished, and in no distress.  HENT:  Head: Normocephalic and atraumatic.  Mouth/Throat: Oropharynx  is clear and moist. No oropharyngeal exudate.  Eyes: Conjunctivae are normal. Right eye exhibits no discharge. Left eye exhibits no discharge. No scleral icterus.  Neck: Normal range of motion. Neck supple.  Cardiovascular: Normal rate, regular rhythm, normal heart sounds and intact distal pulses.   Pulmonary/Chest: Effort normal and breath sounds normal. No respiratory distress. No wheezes. No rales.  Abdominal: Soft. Bowel sounds are normal. Exhibits no distension and no mass. There is no tenderness.  Musculoskeletal: Normal range of motion. Exhibits no edema.  Lymphadenopathy:    No cervical adenopathy.  Neurological: Alert and oriented to person, place, and time. Exhibits normal muscle tone. Gait normal. Coordination normal.  Skin: Skin is warm and dry. No rash noted. Not diaphoretic. No erythema. No pallor.  Psychiatric: Mood, memory and judgment normal.  Vitals reviewed.  LABORATORY DATA: Lab Results  Component Value Date   WBC 4.2 05/18/2022   HGB 13.9 05/18/2022   HCT 41.9 05/18/2022   MCV 89.3 05/18/2022   PLT 181 05/18/2022      Chemistry      Component Value Date/Time   NA 140 05/18/2022 1401   K 4.2 05/18/2022 1401   CL 108 05/18/2022 1401   CO2 28 05/18/2022 1401   BUN 24 (H) 05/18/2022 1401   CREATININE 1.62 (H) 05/18/2022 1401      Component Value Date/Time   CALCIUM 9.7 05/18/2022 1401   ALKPHOS 108 05/18/2022 1401   AST 13 (L) 05/18/2022 1401   ALT 9 05/18/2022 1401   BILITOT 0.4 05/18/2022 1401       RADIOGRAPHIC STUDIES:  CT Chest Wo Contrast  Result Date: 05/18/2022 CLINICAL DATA:  Lung cancer restaging.  * Tracking Code: BO * EXAM: CT CHEST, ABDOMEN AND PELVIS WITHOUT CONTRAST TECHNIQUE: Multidetector CT imaging of the chest, abdomen and pelvis was performed following the standard protocol without IV contrast. RADIATION DOSE REDUCTION: This exam was performed according to the departmental dose-optimization program which includes automated exposure  control, adjustment of the mA and/or kV according to patient size and/or use of iterative reconstruction technique. COMPARISON:  02/08/2022 FINDINGS: CT CHEST FINDINGS Cardiovascular: The heart size is normal. No substantial pericardial effusion. Coronary artery calcification is evident. Mild atherosclerotic calcification is noted in the wall of the thoracic aorta. Mediastinum/Nodes: No mediastinal lymphadenopathy. No evidence for gross hilar lymphadenopathy although assessment  is limited by the lack of intravenous contrast on the current study. The esophagus has normal imaging features. There is no axillary lymphadenopathy. Lungs/Pleura: Radiation fibrosis in the right parahilar and posterior right upper lung is similar to prior with associated volume loss again noted. 5-6 mm left upper lobe pulmonary nodule on 57/4 is stable. Tiny right upper lobe pulmonary nodule on 46/4 is unchanged. No new suspicious pulmonary nodule or mass. No pleural effusion. Musculoskeletal: No worrisome lytic or sclerotic osseous abnormality. CT ABDOMEN PELVIS FINDINGS Hepatobiliary: No suspicious focal abnormality in the liver on this study without intravenous contrast. There is no evidence for gallstones, gallbladder wall thickening, or pericholecystic fluid. No intrahepatic or extrahepatic biliary dilation. Pancreas: No focal mass lesion. No dilatation of the main duct. No intraparenchymal cyst. No peripancreatic edema. Spleen: No splenomegaly. No focal mass lesion. Adrenals/Urinary Tract: No adrenal nodule or mass. 8 mm hypoattenuating lesion upper pole right kidney is stable, compatible with a tiny cyst. No followup imaging is recommended. Left kidney unremarkable. No evidence for hydroureter. The urinary bladder appears normal for the degree of distention. Stomach/Bowel: Stomach is unremarkable. No gastric wall thickening. No evidence of outlet obstruction. Duodenum is normally positioned as is the ligament of Treitz. No small bowel  wall thickening. No small bowel dilatation. The terminal ileum is normal. The appendix is normal. No gross colonic mass. No colonic wall thickening. Large stool volume evident. Vascular/Lymphatic: There is mild atherosclerotic calcification of the abdominal aorta without aneurysm. There is no gastrohepatic or hepatoduodenal ligament lymphadenopathy. No retroperitoneal or mesenteric lymphadenopathy. No pelvic sidewall lymphadenopathy. Reproductive: The prostate gland and seminal vesicles are unremarkable. Other: No intraperitoneal free fluid. Musculoskeletal: No worrisome lytic or sclerotic osseous abnormality. IMPRESSION: 1. Stable exam. No new or progressive findings to suggest recurrent or metastatic disease in the chest, abdomen, or pelvis. 2. Radiation fibrosis in the right parahilar and posterior right upper lung is similar to prior. 3. Stable tiny bilateral upper lobe pulmonary nodules, likely benign. Continued attention on follow-up recommended. 4. Large stool volume. Imaging features could be compatible with clinical constipation. 5.  Aortic Atherosclerosis (ICD10-I70.0). Electronically Signed   By: Misty Stanley M.D.   On: 05/18/2022 13:59   CT Abdomen Pelvis Wo Contrast  Result Date: 05/18/2022 CLINICAL DATA:  Lung cancer restaging.  * Tracking Code: BO * EXAM: CT CHEST, ABDOMEN AND PELVIS WITHOUT CONTRAST TECHNIQUE: Multidetector CT imaging of the chest, abdomen and pelvis was performed following the standard protocol without IV contrast. RADIATION DOSE REDUCTION: This exam was performed according to the departmental dose-optimization program which includes automated exposure control, adjustment of the mA and/or kV according to patient size and/or use of iterative reconstruction technique. COMPARISON:  02/08/2022 FINDINGS: CT CHEST FINDINGS Cardiovascular: The heart size is normal. No substantial pericardial effusion. Coronary artery calcification is evident. Mild atherosclerotic calcification is  noted in the wall of the thoracic aorta. Mediastinum/Nodes: No mediastinal lymphadenopathy. No evidence for gross hilar lymphadenopathy although assessment is limited by the lack of intravenous contrast on the current study. The esophagus has normal imaging features. There is no axillary lymphadenopathy. Lungs/Pleura: Radiation fibrosis in the right parahilar and posterior right upper lung is similar to prior with associated volume loss again noted. 5-6 mm left upper lobe pulmonary nodule on 57/4 is stable. Tiny right upper lobe pulmonary nodule on 46/4 is unchanged. No new suspicious pulmonary nodule or mass. No pleural effusion. Musculoskeletal: No worrisome lytic or sclerotic osseous abnormality. CT ABDOMEN PELVIS FINDINGS Hepatobiliary: No suspicious focal abnormality  in the liver on this study without intravenous contrast. There is no evidence for gallstones, gallbladder wall thickening, or pericholecystic fluid. No intrahepatic or extrahepatic biliary dilation. Pancreas: No focal mass lesion. No dilatation of the main duct. No intraparenchymal cyst. No peripancreatic edema. Spleen: No splenomegaly. No focal mass lesion. Adrenals/Urinary Tract: No adrenal nodule or mass. 8 mm hypoattenuating lesion upper pole right kidney is stable, compatible with a tiny cyst. No followup imaging is recommended. Left kidney unremarkable. No evidence for hydroureter. The urinary bladder appears normal for the degree of distention. Stomach/Bowel: Stomach is unremarkable. No gastric wall thickening. No evidence of outlet obstruction. Duodenum is normally positioned as is the ligament of Treitz. No small bowel wall thickening. No small bowel dilatation. The terminal ileum is normal. The appendix is normal. No gross colonic mass. No colonic wall thickening. Large stool volume evident. Vascular/Lymphatic: There is mild atherosclerotic calcification of the abdominal aorta without aneurysm. There is no gastrohepatic or hepatoduodenal  ligament lymphadenopathy. No retroperitoneal or mesenteric lymphadenopathy. No pelvic sidewall lymphadenopathy. Reproductive: The prostate gland and seminal vesicles are unremarkable. Other: No intraperitoneal free fluid. Musculoskeletal: No worrisome lytic or sclerotic osseous abnormality. IMPRESSION: 1. Stable exam. No new or progressive findings to suggest recurrent or metastatic disease in the chest, abdomen, or pelvis. 2. Radiation fibrosis in the right parahilar and posterior right upper lung is similar to prior. 3. Stable tiny bilateral upper lobe pulmonary nodules, likely benign. Continued attention on follow-up recommended. 4. Large stool volume. Imaging features could be compatible with clinical constipation. 5.  Aortic Atherosclerosis (ICD10-I70.0). Electronically Signed   By: Misty Stanley M.D.   On: 05/18/2022 13:59   VAS [place] ABI WITH/WO TBI  Result Date: 05/10/2022  LOWER EXTREMITY DOPPLER STUDY Patient Name:  Randy Andersen  Date of Exam:   05/10/2022 Medical Rec #: [id number]    Accession #:    [id number] Date of Birth: August 29, 1955    Patient Gender: M Patient Age:   30 years Exam Location:  [facility] Procedure:      VAS [place] ABI WITH/WO TBI Referring Phys: Servando Snare --------------------------------------------------------------------------------  Indications: Claudication x 4 weeks. Patient complains of right calf pain upon              walking approximately one mile. High Risk Factors: Hypertension, current smoker. History of TIA  Comparison Study: No prior exam Performing Technologist: Alvia Grove RVT  Examination Guidelines: A complete evaluation includes at minimum, Doppler waveform signals and systolic blood pressure reading at the level of bilateral brachial, anterior tibial, and posterior tibial arteries, when vessel segments are accessible. Bilateral testing is considered an integral part of a complete examination. Photoelectric Plethysmograph (PPG) waveforms and toe  systolic pressure readings are included as required and additional duplex testing as needed. Limited examinations for reoccurring indications may be performed as noted.  ABI Findings: +---------+------------------+-----+----------+--------+ Right    Rt Pressure (mmHg)IndexWaveform  Comment  +---------+------------------+-----+----------+--------+ Brachial 156                                       +---------+------------------+-----+----------+--------+ PTA      78                0.50 monophasic         +---------+------------------+-----+----------+--------+ DP       86  0.55 monophasic         +---------+------------------+-----+----------+--------+ Great Toe75                0.48 Abnormal           +---------+------------------+-----+----------+--------+ +---------+------------------+-----+---------+-------+ Left     Lt Pressure (mmHg)IndexWaveform Comment +---------+------------------+-----+---------+-------+ Brachial 156                                     +---------+------------------+-----+---------+-------+ PTA      198               1.27 triphasic        +---------+------------------+-----+---------+-------+ DP       175               1.12 biphasic         +---------+------------------+-----+---------+-------+ Great Toe135               0.87 Normal           +---------+------------------+-----+---------+-------+ +-------+-----------+-----------+------------+------------+ ABI/TBIToday's ABIToday's TBIPrevious ABIPrevious TBI +-------+-----------+-----------+------------+------------+ Right  0.55       0.48                                +-------+-----------+-----------+------------+------------+ Left   1.27       0.87                                +-------+-----------+-----------+------------+------------+   Summary: Right: Resting right ankle-brachial index indicates moderate right lower extremity arterial disease.  The right toe-brachial index is abnormal. Left: Resting left ankle-brachial index is within normal range. The left toe-brachial index is normal. *See table(s) above for measurements and observations.  Electronically signed by Servando Snare MD on 05/10/2022 at 3:38:02 PM.    Final      ASSESSMENT/PLAN:  This is a very pleasant 65 year old African-American male with stage IV non-small cell lung cancer, favored to be adenocarcinoma.  He presented with posterior right upper lobe nodule as well as right hilar, infrahilar, subcarinal, and right paratracheal lymphadenopathy.  He also has a 1.4 cm x 1 cm soft tissue nodule in the skin/subcutaneous fat in the left axilla.  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  He was diagnosed in August 2021.  Molecular studies by [facility] are negative for any actionable mutations.   The patient completed SRS to the small metastatic brain lesion under the care of Dr. [name] on 03/18/20   The patient recently completed his 2 years of systemic treatment. He was on palliative systemic chemotherapy with carboplatin for an AUC of 5, Alimta 500 mg per metered squared, and Keytruda 200 mg IV every 3 weeks.  He is status post 35 cycles. The dose of alimta was reduced to 400 mg/m2 due to renal insufficiency. Alimta was ultimately removed from the treatment plan with cycle #11 due to renal insufficiency.    The patient completed SBRT to the enlarging right upper lobe lung mass under the care of Dr. [name] on 04/06/21.   The patient recently had a restaging CT scan.  The patient was seen with Dr. Julien Nordmann today.  Dr. Julien Nordmann personally and independently reviewed the scan discussed results with the patient today.  The scan showed no evidence of disease progression.   Dr. Julien Nordmann  had a lengthy discussion with the patient today about his current condition and recommended treatment options.  Given that the patient completed 2 years of treatment Dr. Julien Nordmann  recommends that the patient continue on observation with close monitoring as Keytruda was studied up to 2 years in lung cancer.   I will arrange for restaging CT scan of the chest without contrast in 3 months.  We will see him a few days after his scan to review the results.  He will continue to follow with vascular surgery for his claudication. The patient and I, as well as Dr. Julien Nordmann and the patient, discussed the importance of smoking cessation. The patient now seems to acknowledge that he needs to quit smoking. We discussed strategies to quit smoking. I have sent him prescriptions for lozenges as well as nicotine patches.   The patient was asking for the number to Dr. Gala Lewandowsky office for his thyroid. I gave him the number.   He will continue to follow with neuro-oncology for his history of metastatic disease to the brain.  The patient was advised to call immediately if he has any concerning symptoms in the interval. The patient voices understanding of current disease status and treatment options and is in agreement with the current care plan. All questions were answered. The patient knows to call the clinic with any problems, questions or concerns. We can certainly see the patient much sooner if necessary    Orders Placed This Encounter  Procedures   CT Chest Wo Contrast    Standing Status:   Future    Standing Expiration Date:   05/18/2023    Order Specific Question:   Preferred imaging location?    Answer:   [facility]   CT Abdomen Pelvis Wo Contrast    Standing Status:   Future    Standing Expiration Date:   05/18/2023    Order Specific Question:   Preferred imaging location?    Answer:   [facility]    Order Specific Question:   Is Oral Contrast requested for this exam?    Answer:   Yes, Per Radiology protocol   CBC with Differential ([facility] Only)    Standing Status:   Future    Standing Expiration Date:   05/19/2023   CMP ([facility] only)     Standing Status:   Future    Standing Expiration Date:   05/19/2023      Tobe Sos Aubrei Bouchie, PA-C 05/18/22  ADDENDUM: Hematology/Oncology Attending: I had a face-to-face encounter with the patient.  I reviewed his record, lab, scan and recommended his care plan.  This is a very pleasant 66 years old African-American male with a stage IV non-small cell lung cancer, adenocarcinoma diagnosed in August 2021 with no actionable mutations.  The patient is status post SRS to solitary brain metastasis.  He is started induction systemic chemotherapy initially with carboplatin, Alimta and Keytruda for 4 cycles followed by maintenance treatment with Alimta and Keytruda but Alimta was discontinued starting from cycle #11 secondary to renal insufficiency. The patient completed 2 years of treatment with immunotherapy with Keytruda. He is here today for evaluation with repeat CT scan of the chest, abdomen and pelvis. I personally and independently reviewed the scan and discussed the result with the patient.  His scan showed no concerning findings for disease progression. I recommended for the patient to continue on observation with repeat CT scan of the chest, abdomen and pelvis in 3 months. For the smoking  cessation I strongly encouraged the patient to quit smoking and we will provide him with NicoDerm patches to help with the smoking cessation. The patient was advised to call immediately if he has any other concerning symptoms in the interval. The total time spent in the appointment was 30 minutes. Disclaimer: This note was dictated with voice recognition software. Similar sounding words can inadvertently be transcribed and may be missed upon review. Eilleen Kempf, MD

## 2022-05-17 ENCOUNTER — Ambulatory Visit (HOSPITAL_COMMUNITY)
Admission: RE | Admit: 2022-05-17 | Discharge: 2022-05-17 | Disposition: A | Discharge status: Home / Self Care | Payer: Medicare PPO | Source: Ambulatory Visit | Attending: Physician Assistant | Admitting: Physician Assistant

## 2022-05-17 ENCOUNTER — Other Ambulatory Visit: Payer: Self-pay

## 2022-05-17 DIAGNOSIS — C3411 Malignant neoplasm of upper lobe, right bronchus or lung: Secondary | ICD-10-CM | POA: Insufficient documentation

## 2022-05-17 DIAGNOSIS — N289 Disorder of kidney and ureter, unspecified: Secondary | ICD-10-CM | POA: Diagnosis not present

## 2022-05-17 DIAGNOSIS — R911 Solitary pulmonary nodule: Secondary | ICD-10-CM | POA: Diagnosis not present

## 2022-05-17 DIAGNOSIS — C3490 Malignant neoplasm of unspecified part of unspecified bronchus or lung: Secondary | ICD-10-CM | POA: Diagnosis not present

## 2022-05-18 ENCOUNTER — Other Ambulatory Visit: Payer: Self-pay

## 2022-05-18 ENCOUNTER — Inpatient Hospital Stay: Payer: No Typology Code available for payment source

## 2022-05-18 ENCOUNTER — Inpatient Hospital Stay (HOSPITAL_BASED_OUTPATIENT_CLINIC_OR_DEPARTMENT_OTHER): Payer: No Typology Code available for payment source | Admitting: Physician Assistant

## 2022-05-18 VITALS — BP 150/86 | HR 80 | Temp 99.1°F | Resp 15 | Wt 187.1 lb

## 2022-05-18 DIAGNOSIS — Z5112 Encounter for antineoplastic immunotherapy: Secondary | ICD-10-CM | POA: Diagnosis not present

## 2022-05-18 DIAGNOSIS — C3411 Malignant neoplasm of upper lobe, right bronchus or lung: Secondary | ICD-10-CM | POA: Diagnosis not present

## 2022-05-18 DIAGNOSIS — C7931 Secondary malignant neoplasm of brain: Secondary | ICD-10-CM | POA: Diagnosis not present

## 2022-05-18 DIAGNOSIS — Z79899 Other long term (current) drug therapy: Secondary | ICD-10-CM | POA: Diagnosis not present

## 2022-05-18 DIAGNOSIS — Z720 Tobacco use: Secondary | ICD-10-CM

## 2022-05-18 LAB — CMP (CANCER CENTER ONLY)
ALT: 9 U/L (ref 0–44)
AST: 13 U/L — ABNORMAL LOW (ref 15–41)
Albumin: 4.1 g/dL (ref 3.5–5.0)
Alkaline Phosphatase: 108 U/L (ref 38–126)
Anion gap: 4 — ABNORMAL LOW (ref 5–15)
BUN: 24 mg/dL — ABNORMAL HIGH (ref 8–23)
CO2: 28 mmol/L (ref 22–32)
Calcium: 9.7 mg/dL (ref 8.9–10.3)
Chloride: 108 mmol/L (ref 98–111)
Creatinine: 1.62 mg/dL — ABNORMAL HIGH (ref 0.61–1.24)
GFR, Estimated: 46 mL/min — ABNORMAL LOW (ref >60)
Glucose, Bld: 153 mg/dL — ABNORMAL HIGH (ref 70–99)
Potassium: 4.2 mmol/L (ref 3.5–5.1)
Sodium: 140 mmol/L (ref 135–145)
Total Bilirubin: 0.4 mg/dL (ref 0.3–1.2)
Total Protein: 7.5 g/dL (ref 6.5–8.1)

## 2022-05-18 LAB — CBC WITH DIFFERENTIAL (CANCER CENTER ONLY)
Abs Immature Granulocytes: 0 10*3/uL (ref 0.00–0.07)
Basophils Absolute: 0 10*3/uL (ref 0.0–0.1)
Basophils Relative: 1 %
Eosinophils Absolute: 0.1 10*3/uL (ref 0.0–0.5)
Eosinophils Relative: 3 %
HCT: 41.9 % (ref 39.0–52.0)
Hemoglobin: 13.9 g/dL (ref 13.0–17.0)
Immature Granulocytes: 0 %
Lymphocytes Relative: 36 %
Lymphs Abs: 1.5 10*3/uL (ref 0.7–4.0)
MCH: 29.6 pg (ref 26.0–34.0)
MCHC: 33.2 g/dL (ref 30.0–36.0)
MCV: 89.3 fL (ref 80.0–100.0)
Monocytes Absolute: 0.5 10*3/uL (ref 0.1–1.0)
Monocytes Relative: 11 %
Neutro Abs: 2 10*3/uL (ref 1.7–7.7)
Neutrophils Relative %: 49 %
Platelet Count: 181 10*3/uL (ref 150–400)
RBC: 4.69 MIL/uL (ref 4.22–5.81)
RDW: 13.9 % (ref 11.5–15.5)
WBC Count: 4.2 10*3/uL (ref 4.0–10.5)
nRBC: 0 % (ref 0.0–0.2)

## 2022-05-18 LAB — TSH: TSH: 2.072 u[IU]/mL (ref 0.350–4.500)

## 2022-05-18 MED ORDER — NICOTINE POLACRILEX 2 MG MT LOZG: 2.0000 mg | LOZENGE | OROMUCOSAL | 0 refills | Status: DC | PRN

## 2022-05-18 MED ORDER — NICOTINE 21 MG/24HR TD PT24: 21.0000 mg | MEDICATED_PATCH | Freq: Every day | TRANSDERMAL | 2 refills | Status: DC

## 2022-05-19 ENCOUNTER — Other Ambulatory Visit: Payer: Medicare PPO

## 2022-05-19 ENCOUNTER — Other Ambulatory Visit: Payer: Self-pay

## 2022-05-19 ENCOUNTER — Encounter: Payer: Self-pay | Admitting: Internal Medicine

## 2022-05-22 ENCOUNTER — Ambulatory Visit: Payer: Medicare PPO | Admitting: Internal Medicine

## 2022-06-01 ENCOUNTER — Telehealth: Payer: Self-pay | Admitting: *Deleted

## 2022-06-01 NOTE — Telephone Encounter (Signed)
Patient called to report that he has been having headaches and had bilateral ear aches.  He is scheduled to have MRI of brain completed this Saturday.  Encouraged him to be sure to attend this imaging appointment as it could explain if there is a concern in the brain that could be contributing to the new issue.    He also reported that he went to the [place] for this issue and was given some medication.  Advised that if MRI does not show anything of concern that could contribute to headaches and ear aches then he should follow back up with the [facility] for further evaluation.   Routed to Dr Julien Nordmann and Dr Jones Broom.

## 2022-06-03 ENCOUNTER — Other Ambulatory Visit: Payer: Medicare PPO

## 2022-06-05 ENCOUNTER — Inpatient Hospital Stay: Payer: Medicare PPO

## 2022-06-12 ENCOUNTER — Inpatient Hospital Stay: Payer: Medicare PPO | Admitting: Internal Medicine

## 2022-06-21 ENCOUNTER — Telehealth: Payer: Self-pay | Admitting: Physician Assistant

## 2022-06-21 NOTE — Telephone Encounter (Signed)
I received a message about his insurance authorization. When I evaluated further, his CT scan was scheduled over a month earlier than the estimated date. I called GI and cancelled his CT on 12/19 and rescheduled it to 08/17/22. I called the patient and made him aware of this change. His arrival time at [facility] is 1:40 PM. Discussed he is still schedule for MRI next week on 12/5 and follow up with Dr. Mickeal Skinner as previously scheduled. He expressed understanding. I will reach out to the prior authorization team to make them aware to try to obtain the authorization closer to the estimated date of the CT scan.

## 2022-06-27 ENCOUNTER — Inpatient Hospital Stay: Admission: RE | Admit: 2022-06-27 | Payer: Medicare PPO | Source: Ambulatory Visit

## 2022-07-03 ENCOUNTER — Inpatient Hospital Stay: Payer: Medicare PPO | Admitting: Internal Medicine

## 2022-07-03 ENCOUNTER — Inpatient Hospital Stay: Payer: Medicare PPO

## 2022-07-11 ENCOUNTER — Other Ambulatory Visit: Payer: Medicare PPO

## 2022-07-11 ENCOUNTER — Other Ambulatory Visit: Payer: Self-pay | Admitting: Pharmacist

## 2022-07-13 ENCOUNTER — Inpatient Hospital Stay: Admission: RE | Admit: 2022-07-13 | Payer: Medicare PPO | Source: Ambulatory Visit

## 2022-07-14 ENCOUNTER — Ambulatory Visit
Admission: RE | Admit: 2022-07-14 | Discharge: 2022-07-14 | Disposition: A | Discharge status: Home / Self Care | Payer: Medicare PPO | Source: Ambulatory Visit | Attending: Internal Medicine | Admitting: Internal Medicine

## 2022-07-14 DIAGNOSIS — C7931 Secondary malignant neoplasm of brain: Secondary | ICD-10-CM

## 2022-07-14 DIAGNOSIS — C729 Malignant neoplasm of central nervous system, unspecified: Secondary | ICD-10-CM | POA: Diagnosis not present

## 2022-07-14 MED ORDER — GADOPICLENOL 0.5 MMOL/ML IV SOLN
7.5000 mL | Freq: Once | INTRAVENOUS | Status: AC | PRN
  Administered 2022-07-14: 8 mL via INTRAVENOUS

## 2022-07-18 ENCOUNTER — Inpatient Hospital Stay: Payer: Medicare PPO | Attending: Internal Medicine | Admitting: Internal Medicine

## 2022-07-25 ENCOUNTER — Inpatient Hospital Stay: Payer: Medicare PPO | Attending: Internal Medicine | Admitting: Internal Medicine

## 2022-07-25 DIAGNOSIS — C7931 Secondary malignant neoplasm of brain: Secondary | ICD-10-CM | POA: Diagnosis not present

## 2022-07-25 NOTE — Progress Notes (Signed)
I connected with Clarene Critchley on 07/25/22 at  2:30 PM EST by telephone visit and verified that I am speaking with the correct person using two identifiers.  I discussed the limitations, risks, security and privacy concerns of performing an evaluation and management service by telemedicine and the availability of in-person appointments. I also discussed with the patient that there may be a patient responsible charge related to this service. The patient expressed understanding and agreed to proceed.  Other persons participating in the visit and their role in the encounter:  n/a  Patient's location:  Home Provider's location:  Office Chief Complaint:  Malignant neoplasm metastatic to brain [facility])  History of Present Ilness: Aidyn Kellis reports no clinical changes today.  No change in right leg weakness, no issues with gait.   Observations: Language and cognition at baseline  Imaging:  [facility] Clinician Interpretation: I have personally reviewed the CNS images as listed.  My interpretation, in the context of the patient's clinical presentation, is stable disease  MR BRAIN W WO CONTRAST  Result Date: 07/18/2022 CLINICAL DATA:  Brain/CNS neoplasm, assess treatment response EXAM: MRI HEAD WITHOUT AND WITH CONTRAST TECHNIQUE: Multiplanar, multiecho pulse sequences of the brain and surrounding structures were obtained without and with intravenous contrast. CONTRAST:  8 mL of Vueway IV. COMPARISON:  MRI 02/17/2022. FINDINGS: Brain: Slight decrease in size of a 10 mm lesion in the left superior frontal gyrus. No other enhancing lesions identified. No evidence of acute infarct, acute hemorrhage, midline shift or hydrocephalus. Similar T2/FLAIR hyperintensity within the gray and white matter, compatible with chronic microvascular ischemic disease (the left corona radiata and bilateral basal ganglia with hemosiderin). Vascular: Normal flow voids. Skull and upper cervical spine: Normal marrow signal. Sinuses/Orbits:  Negative. IMPRESSION: Slight decrease in size of a 10 mm lesion in the left superior frontal gyrus. No other enhancing lesions identified. Electronically Signed   By: Margaretha Sheffield M.D.   On: 07/18/2022 15:18    Assessment and Plan: Malignant neoplasm metastatic to brain [facility])  Clinically and radiographically stable.    We ask that Fadil Macmaster return to clinic in 6 months following next brain MRI, or sooner as needed.   I discussed the assessment and treatment plan with the patient.  The patient was provided an opportunity to ask questions and all were answered.  The patient agreed with the plan and demonstrated understanding of the instructions.    The patient was advised to call back or seek an in-person evaluation if the symptoms worsen or if the condition fails to improve as anticipated.    Ventura Sellers, MD   I provided 15 minutes of non face-to-face telephone visit time during this encounter, and > 50% was spent counseling as documented under my assessment & plan.

## 2022-07-26 ENCOUNTER — Telehealth: Payer: Self-pay | Admitting: Internal Medicine

## 2022-07-26 NOTE — Telephone Encounter (Signed)
Called patient to schedule f/u. Patient notified.

## 2022-07-27 ENCOUNTER — Telehealth: Payer: Self-pay | Admitting: Internal Medicine

## 2022-07-27 NOTE — Telephone Encounter (Signed)
Called patient regarding upcoming January appointments, patient is notified. 

## 2022-08-10 ENCOUNTER — Other Ambulatory Visit: Payer: Self-pay | Admitting: Radiation Therapy

## 2022-08-16 ENCOUNTER — Other Ambulatory Visit: Payer: Medicare PPO

## 2022-08-17 ENCOUNTER — Ambulatory Visit
Admission: RE | Admit: 2022-08-17 | Discharge: 2022-08-17 | Disposition: A | Discharge status: Home / Self Care | Payer: Medicare PPO | Source: Ambulatory Visit | Attending: Physician Assistant | Admitting: Physician Assistant

## 2022-08-17 DIAGNOSIS — C3411 Malignant neoplasm of upper lobe, right bronchus or lung: Secondary | ICD-10-CM

## 2022-08-17 DIAGNOSIS — I700 Atherosclerosis of aorta: Secondary | ICD-10-CM | POA: Diagnosis not present

## 2022-08-17 DIAGNOSIS — R911 Solitary pulmonary nodule: Secondary | ICD-10-CM | POA: Diagnosis not present

## 2022-08-17 DIAGNOSIS — N289 Disorder of kidney and ureter, unspecified: Secondary | ICD-10-CM | POA: Diagnosis not present

## 2022-08-17 DIAGNOSIS — C3490 Malignant neoplasm of unspecified part of unspecified bronchus or lung: Secondary | ICD-10-CM | POA: Diagnosis not present

## 2022-08-18 ENCOUNTER — Inpatient Hospital Stay: Payer: Medicare PPO

## 2022-08-18 ENCOUNTER — Other Ambulatory Visit: Payer: Medicare PPO

## 2022-08-22 ENCOUNTER — Inpatient Hospital Stay: Payer: Medicare PPO | Admitting: Internal Medicine

## 2022-08-22 ENCOUNTER — Telehealth: Payer: Self-pay | Admitting: Internal Medicine

## 2022-08-22 NOTE — Telephone Encounter (Signed)
Called patient per provider PAL. Patient r/s and notified.

## 2022-09-14 NOTE — Progress Notes (Signed)
[facility] OFFICE PROGRESS NOTE  Windy Fast, MD [address] [place] [address]  DIAGNOSIS: Stage IV non-small cell lung cancer, favored to be adenocarcinoma.  He presented with posterior right upper lobe nodule as well as right hilar, infrahilar, subcarinal, and right paratracheal lymphadenopathy.  He also has a 1.4 cm x 1 cm soft tissue nodule in the skin/subcutaneous fat in the left axilla.  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  He was diagnosed in August 2021   Molecular Biomarkers: BIOMARKER(S)         % CFDNA OR AMPLIFICATION       ASSOCIATED FDA-APPROVED THERAPIES        CLINICAL TRIAL AVAILABILITY [id number] 2.9% None    Yes [id number] 0.4% None    Yes [id number] 0.2% None    Yes    PRIOR THERAPY: 1) SRS to the small metastatic brain lesion under the care of Dr. [name] on 03/18/20 2) Radiation to the enlarging right upper lobe lung mass under the care of Dr. [name]. Last dose on 04/06/21.  3)   Palliative systemic chemotherapy with carboplatin for an AUC of 5, Alimta 500 mg/m2, and Keytruda 200 mg IV every 3 weeks. Last dose on 04/27/22. Status post 35 cycles. Starting from cycle #5 he will be on maintenance treatment with Alimta and Keytruda every 3 weeks. His dose of Alimta was reduced to 400 mg/m2. Alimta removed starting from cycle #11 due to renal insuffiencey.   CURRENT THERAPY: Observation   INTERVAL HISTORY: Randy Andersen 67 y.o. male returns to clinic today for a 70-monthfollow-up visit.  The patient was diagnosed with stage IV lung cancer and completed 2 years of immunotherapy with Keytruda.  He is currently on observation and feeling fairly well.  Today, he denies any fever, chills, or night sweats.  He lost a few pounds since last being seen although he states his appetite is better than prior.  He was previously followed by member the nutritionist team while he was undergoing treatment and he sometimes would receive  complementary protein supplemental drinks due to social constraints.  He is wondering if he can get any complementary vanilla ensures today.  He denies any changes in his breathing.  He reports stable dyspnea on exertion.  He reports he is no longer coughing.  He still trying to cut back on smoking but he is not quite completely.  He states that the lozengers that were sent at his last appointment do not taste good.  He was not aware that a prescription was sent for NicoDerm patches.  Denies any nausea, vomiting, diarrhea, or constipation.  Denies any headache or visual changes.  Denies any rashes or skin changes. The patient follows closely with Dr. VMickeal Skinnerevery 6 months for routine brain MRIs due to his history of metastatic disease to the brain.  The patient mentions that he notices sometimes he has some issues related to erectile dysfunction.  Per chart, review it appears that he was prescribed Cialis in the past.  The patient recently had a restaging CT scan performed.  He is here today for evaluation to review his scan results.  MEDICAL HISTORY: Past Medical History:  Diagnosis Date   Asthma    as a child   Chronic pain disorder    LT leg and foot   Heart murmur    as a child   Hypertension    Malignant neoplasm of upper lobe of right lung (  [place])    Stroke ([place])    mini stroke - 2015, some tingling in fingers on right     ALLERGIES:  has No Known Allergies.  MEDICATIONS:  Current Outpatient Medications  Medication Sig Dispense Refill   amLODipine (NORVASC) 10 MG tablet Take 10 mg by mouth daily.     Cholecalciferol 100 MCG (4000 UT) TABS Take 2 tablets by mouth daily.     dexamethasone (DECADRON) 1 MG tablet Take 1 tablet (1 mg total) by mouth daily. 60 tablet 1   diclofenac Sodium (VOLTAREN) 1 % GEL Apply 4 g topically 3 (three) times daily.     doxylamine, Sleep, (UNISOM) 25 MG tablet Take 1 tablet (25 mg total) by mouth at bedtime as needed for sleep. 30 tablet 0    HYDROcodone-acetaminophen (NORCO) 10-325 MG tablet Take 1 tablet by mouth 2 (two) times daily.     lidocaine (LIDODERM) 5 % Place 1 patch onto the skin daily. Remove & Discard patch within 12 hours or as directed by MD 15 patch 0   nicotine (NICODERM CQ) 21 mg/24hr patch Place 1 patch (21 mg total) onto the skin daily. 28 patch 2   nicotine polacrilex (COMMIT) 2 MG lozenge Take 1 lozenge (2 mg total) by mouth as needed for smoking cessation. 100 tablet 0   oxybutynin (DITROPAN-XL) 10 MG 24 hr tablet Take by mouth.     oxyCODONE-acetaminophen (PERCOCET/ROXICET) 5-325 MG tablet Take 1 tablet by mouth every 6 (six) hours as needed for severe pain. 12 tablet 0   polyethylene glycol powder (GLYCOLAX/MIRALAX) 17 GM/SCOOP powder      potassium chloride SA (KLOR-CON M) 20 MEQ tablet Take 1 tablet (20 mEq total) by mouth daily. 5 tablet 0   prochlorperazine (COMPAZINE) 10 MG tablet Take 1 tablet (10 mg total) by mouth every 6 (six) hours as needed. 30 tablet 2   rivaroxaban (XARELTO) 20 MG TABS tablet TAKE 1 TABLET BY MOUTH ONCE A DAY WITH SUPPER 30 tablet 2   senna-docusate (SENOKOT-S) 8.6-50 MG tablet Take 2 tablets by mouth daily.     tadalafil (CIALIS) 20 MG tablet Take by mouth.     tamsulosin (FLOMAX) 0.4 MG CAPS capsule Take 1 capsule (0.4 mg total) by mouth daily. 30 capsule 2   No current facility-administered medications for this visit.    SURGICAL HISTORY:  Past Surgical History:  Procedure Laterality Date   BUNIONECTOMY Left    had hammer toe surgery also and callus removed   BUNIONECTOMY Right    also had hammer toe surgery and callus removed   LEG SURGERY Left    PT reports he has pins placed in his Lt leg   ORIF TIBIA PLATEAU  10/09/2012   Dr Lorin Mercy   ORIF TIBIA PLATEAU Left 10/08/2012   Procedure: OPEN REDUCTION INTERNAL FIXATION (ORIF) TIBIAL PLATEAU;  Surgeon: Marybelle Killings, MD;  Location: [facility];  Service: Orthopedics;  Laterality: Left;   VIDEO BRONCHOSCOPY WITH ENDOBRONCHIAL  NAVIGATION N/A 02/25/2020   Procedure: VIDEO BRONCHOSCOPY WITH ENDOBRONCHIAL NAVIGATION with biopsies;  Surgeon: Collene Gobble, MD;  Location: [facility];  Service: Thoracic;  Laterality: N/A;   VIDEO BRONCHOSCOPY WITH ENDOBRONCHIAL ULTRASOUND N/A 02/25/2020   Procedure: VIDEO BRONCHOSCOPY WITH ENDOBRONCHIAL ULTRASOUND;  Surgeon: Collene Gobble, MD;  Location: [facility];  Service: Thoracic;  Laterality: N/A;    REVIEW OF SYSTEMS:   Review of Systems  Constitutional: Negative for appetite change, chills, fatigue, fever and unexpected weight change.  HENT: Negative for  mouth sores, nosebleeds, sore throat and trouble swallowing.   Eyes: Negative for eye problems and icterus.  Respiratory: Positive for dyspnea on exertion.  Negative for cough, hemoptysis, and wheezing.   Cardiovascular: Negative for chest pain and leg swelling.  Gastrointestinal: Negative for abdominal pain, constipation, diarrhea, nausea and vomiting.  Genitourinary: Negative for bladder incontinence, difficulty urinating, dysuria, frequency and hematuria.   Musculoskeletal: Negative for back pain, gait problem, neck pain and neck stiffness.  Skin: Negative for itching and rash.  Neurological: Negative for dizziness, extremity weakness, gait problem, headaches, light-headedness and seizures.  Hematological: Negative for adenopathy. Does not bruise/bleed easily.  Psychiatric/Behavioral: Negative for confusion, depression and sleep disturbance. The patient is not nervous/anxious.     PHYSICAL EXAMINATION:  Blood pressure (!) 165/99, pulse 96, temperature 98.2 F (36.8 C), temperature source Oral, resp. rate 16, weight 182 lb 3.2 oz (82.6 kg), SpO2 100 %.  ECOG PERFORMANCE STATUS: 1  Physical Exam  Constitutional: Oriented to person, place, and time and well-developed, well-nourished, and in no distress.  HENT:  Head: Normocephalic and atraumatic.  Mouth/Throat: Oropharynx is clear and moist. No oropharyngeal exudate.  Eyes:  Conjunctivae are normal. Right eye exhibits no discharge. Left eye exhibits no discharge. No scleral icterus.  Neck: Normal range of motion. Neck supple.  Cardiovascular: Normal rate, regular rhythm, normal heart sounds and intact distal pulses.   Pulmonary/Chest: Effort normal and breath sounds normal. No respiratory distress. No wheezes. No rales.  Abdominal: Soft. Bowel sounds are normal. Exhibits no distension and no mass. There is no tenderness.  Musculoskeletal: Normal range of motion. Exhibits no edema.  Lymphadenopathy:    No cervical adenopathy.  Neurological: Alert and oriented to person, place, and time. Exhibits normal muscle tone. Gait normal. Coordination normal.  Skin: Skin is warm and dry. No rash noted. Not diaphoretic. No erythema. No pallor.  Psychiatric: Mood, memory and judgment normal.  Vitals reviewed.  LABORATORY DATA: Lab Results  Component Value Date   WBC 4.2 05/18/2022   HGB 13.9 05/18/2022   HCT 41.9 05/18/2022   MCV 89.3 05/18/2022   PLT 181 05/18/2022      Chemistry      Component Value Date/Time   NA 140 05/18/2022 1401   K 4.2 05/18/2022 1401   CL 108 05/18/2022 1401   CO2 28 05/18/2022 1401   BUN 24 (H) 05/18/2022 1401   CREATININE 1.62 (H) 05/18/2022 1401      Component Value Date/Time   CALCIUM 9.7 05/18/2022 1401   ALKPHOS 108 05/18/2022 1401   AST 13 (L) 05/18/2022 1401   ALT 9 05/18/2022 1401   BILITOT 0.4 05/18/2022 1401       RADIOGRAPHIC STUDIES:  No results found.   ASSESSMENT/PLAN:  This is a very pleasant 67 year old African-American male with stage IV non-small cell lung cancer, favored to be adenocarcinoma.  He presented with posterior right upper lobe nodule as well as right hilar, infrahilar, subcarinal, and right paratracheal lymphadenopathy.  He also has a 1.4 cm x 1 cm soft tissue nodule in the skin/subcutaneous fat in the left axilla.  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  He was  diagnosed in August 2021.  Molecular studies by [facility] are negative for any actionable mutations.   The patient completed SRS to the small metastatic brain lesion under the care of Dr. [name] on 03/18/20   The patient recently completed his 2 years of systemic treatment. He was on palliative  systemic chemotherapy with carboplatin for an AUC of 5, Alimta 500 mg per metered squared, and Keytruda 200 mg IV every 3 weeks.  He is status post 35 cycles. The dose of alimta was reduced to 400 mg/m2 due to renal insufficiency. Alimta was ultimately removed from the treatment plan with cycle #11 due to renal insufficiency.    The patient completed SBRT to the enlarging right upper lobe lung mass under the care of Dr. [name] on 04/06/21.    The patient recently had a restaging CT scan performed.  Dr. Julien Nordmann personally and independently reviewed the scan discussed results with the patient today.  The scan did not show any evidence of disease progression.  Dr. Julien Nordmann recommends that the patient continue on observation with restaging CT scan the chest in 3 months.   I do not have any recent lab work to check the patient's creatinine.  We will need repeat labs prior to his next CT scan.  I wrote a note in the order that if his creatinine is elevated at the time of his next CT scan in 3 months, that is okay to hold the IV contrast.  He will continue to follow with vascular surgery for his history of claudication.  I encouraged the patient to discuss with his family doctor about his concerns with erectile dysfunction.  The patient does have history of vascular disease.  It looks like he was previously prescribed Cialis.  I also reviewed the incidental chronic findings on the patient's scan with him.  Discussed that his prostate appeared to be borderline enlarged and should he have any signs and symptoms of BPH to discuss with his PCP.  We reviewed signs and symptoms today.  The patient mentions that he missed his  appointment with Dr. Harlow Asa last week.  He is going to call them today to reschedule.  I have refilled the NicoDerm patches and sent to the patient's pharmacy for smoking cessation.  We have reached out to the dietitian who will arrange for the patient to receive another complementary case of Ensure vanilla before leaving the clinic today.  He will continue to follow closely with neuro-oncology for his history of metastatic disease to the brain.  The patient was advised to call immediately if he has any concerning symptoms in the interval. The patient voices understanding of current disease status and treatment options and is in agreement with the current care plan. All questions were answered. The patient knows to call the clinic with any problems, questions or concerns. We can certainly see the patient much sooner if necessary     Orders Placed This Encounter  Procedures   CT Chest W Contrast    Ok to hold IV contrast based on creatinine that day    Standing Status:   Future    Standing Expiration Date:   09/18/2023    Order Specific Question:   If indicated for the ordered procedure, I authorize the administration of contrast media per Radiology protocol    Answer:   Yes    Order Specific Question:   Does the patient have a contrast media/X-ray dye allergy?    Answer:   No    Order Specific Question:   Preferred imaging location?    Answer:   [facility]   CT Abdomen Pelvis W Contrast    Ok to hold IV contrast based on creatinine that day    Standing Status:   Future    Standing Expiration Date:  09/18/2023    Order Specific Question:   If indicated for the ordered procedure, I authorize the administration of contrast media per Radiology protocol    Answer:   Yes    Order Specific Question:   Does the patient have a contrast media/X-ray dye allergy?    Answer:   No    Order Specific Question:   Preferred imaging location?    Answer:   [facility]    Order  Specific Question:   Is Oral Contrast requested for this exam?    Answer:   Yes, Per Radiology protocol   CBC with Differential ([facility] Only)    Standing Status:   Future    Standing Expiration Date:   09/19/2023   CMP ([facility] only)    Standing Status:   Future    Standing Expiration Date:   09/19/2023     Tobe Sos Deidre Carino, PA-C 09/18/22  ADDENDUM: Hematology/Oncology Attending: I had a face-to-face encounter with the patient today.  I reviewed his record, lab, scan and recommended his care plan.  This is a very pleasant 67 years old African-American male with stage IV non-small cell lung cancer, adenocarcinoma diagnosed in August 2021 with brain metastasis status post SRS to metastatic brain lesion as well as palliative radiotherapy to the enlarging right upper lobe lung mass.  The patient then underwent systemic chemotherapy with carboplatin, Alimta and Keytruda for 4 cycles followed by maintenance treatment with Alimta and Keytruda until cycle #10 then Keytruda was maintained as single agent after discontinuation of Alimta secondary to renal insufficiency.  He completed 2 years of treatment with Keytruda and has been tolerating it fairly well.  The patient has no evidence for disease progression after the 2 years of treatment and he is currently on observation. He had repeat CT scan of the chest, abdomen and pelvis performed recently.  I personally and independently reviewed the scan and discussed the result with the patient today. His scan showed no concerning findings for disease recurrence or metastasis. I recommended for him to continue on observation with repeat CT scan of the chest, abdomen and pelvis in 3 months. The patient was advised to call immediately if he has any other concerning symptoms in the interval. The total time spent in the appointment was 30 minutes. Disclaimer: This note was dictated with voice recognition software. Similar sounding words can  inadvertently be transcribed and may be missed upon review. Eilleen Kempf, MD

## 2022-09-18 ENCOUNTER — Other Ambulatory Visit: Payer: Self-pay

## 2022-09-18 ENCOUNTER — Inpatient Hospital Stay: Payer: No Typology Code available for payment source | Attending: Internal Medicine | Admitting: Physician Assistant

## 2022-09-18 VITALS — BP 165/99 | HR 96 | Temp 98.2°F | Resp 16 | Wt 182.2 lb

## 2022-09-18 DIAGNOSIS — Z79899 Other long term (current) drug therapy: Secondary | ICD-10-CM | POA: Diagnosis not present

## 2022-09-18 DIAGNOSIS — C7931 Secondary malignant neoplasm of brain: Secondary | ICD-10-CM | POA: Diagnosis not present

## 2022-09-18 DIAGNOSIS — Z720 Tobacco use: Secondary | ICD-10-CM | POA: Diagnosis not present

## 2022-09-18 DIAGNOSIS — C3411 Malignant neoplasm of upper lobe, right bronchus or lung: Secondary | ICD-10-CM | POA: Insufficient documentation

## 2022-09-18 MED ORDER — NICOTINE 21 MG/24HR TD PT24: 21.0000 mg | MEDICATED_PATCH | Freq: Every day | TRANSDERMAL | 2 refills | Status: AC

## 2022-09-18 NOTE — Progress Notes (Signed)
Nutrition  Complimentary case of ensure HP given to patient today.  Kalii Chesmore B. Twisha Vanpelt, RD, LDN Registered Dietitian [phone]  

## 2022-12-06 ENCOUNTER — Ambulatory Visit (INDEPENDENT_AMBULATORY_CARE_PROVIDER_SITE_OTHER)
Admission: RE | Admit: 2022-12-06 | Discharge: 2022-12-06 | Disposition: A | Discharge status: Home / Self Care | Payer: Medicare PPO | Source: Ambulatory Visit | Attending: Vascular Surgery | Admitting: Vascular Surgery

## 2022-12-06 ENCOUNTER — Ambulatory Visit (HOSPITAL_COMMUNITY)
Admission: RE | Admit: 2022-12-06 | Discharge: 2022-12-06 | Disposition: A | Discharge status: Home / Self Care | Payer: Medicare PPO | Source: Ambulatory Visit | Attending: Vascular Surgery | Admitting: Vascular Surgery

## 2022-12-06 ENCOUNTER — Ambulatory Visit (INDEPENDENT_AMBULATORY_CARE_PROVIDER_SITE_OTHER): Payer: Medicare PPO | Admitting: Physician Assistant

## 2022-12-06 VITALS — BP 158/88 | HR 68 | Temp 98.0°F | Wt 185.0 lb

## 2022-12-06 DIAGNOSIS — I739 Peripheral vascular disease, unspecified: Secondary | ICD-10-CM

## 2022-12-06 LAB — VAS US ABI WITH/WO TBI
Left ABI: 1.02
Right ABI: 0.47

## 2022-12-06 NOTE — Progress Notes (Unsigned)
Office Note   History of Present Illness   Randy Andersen is a 67 y.o. (02/01/1956) male who presents for PAD follow-up.  He was previously evaluated by Dr. Randie Heinz for right lower extremity claudication.  This began sometime in 2023.  He has no prior history of vascular interventions.  He does have a history of lung cancer with brain mets, which is actively being monitored.  He returns today for follow-up.  He endorses worsening symptoms of claudication in his right calf.  [name].  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  [name].  [name].  [name].  He takes Xarelto daily.  [name], however he has not started it. [name]   Current Outpatient Medications  Medication Sig Dispense Refill   amLODipine (NORVASC) 10 MG tablet Take 10 mg by mouth daily.     Cholecalciferol 100 MCG (4000 UT) TABS Take 2 tablets by mouth daily.     dexamethasone (DECADRON) 1 MG tablet Take 1 tablet (1 mg total) by mouth daily. 60 tablet 1   diclofenac Sodium (VOLTAREN) 1 % GEL Apply 4 g topically 3 (three) times daily.     doxylamine, Sleep, (UNISOM) 25 MG tablet Take 1 tablet (25 mg total) by mouth at bedtime as needed for sleep. 30 tablet 0   HYDROcodone-acetaminophen (NORCO) 10-325 MG tablet Take 1 tablet by mouth 2 (two) times daily.     lidocaine (LIDODERM) 5 % Place 1 patch onto the skin daily. Remove & Discard patch within 12 hours or as directed by MD 15 patch 0   nicotine (NICODERM CQ) 21 mg/24hr patch Place 1 patch (21 mg total) onto the skin daily. 28 patch 2   nicotine polacrilex (COMMIT) 2 MG lozenge  Take 1 lozenge (2 mg total) by mouth as needed for smoking cessation. 100 tablet 0   oxybutynin (DITROPAN-XL) 10 MG 24 hr tablet Take by mouth.     oxyCODONE-acetaminophen (PERCOCET/ROXICET) 5-325 MG tablet Take 1 tablet by mouth every 6 (six) hours as needed for severe pain. 12 tablet 0   polyethylene glycol powder (GLYCOLAX/MIRALAX) 17 GM/SCOOP powder      potassium chloride SA (KLOR-CON M) 20 MEQ tablet Take 1 tablet (20 mEq total) by mouth daily. 5 tablet 0   prochlorperazine (COMPAZINE) 10 MG tablet Take 1 tablet (10 mg total) by mouth every 6 (six) hours as needed. 30 tablet 2   senna-docusate (SENOKOT-S) 8.6-50 MG tablet Take 2 tablets by mouth daily.     tadalafil (CIALIS) 20 MG tablet Take by mouth.     tamsulosin (FLOMAX) 0.4 MG CAPS capsule Take 1 capsule (0.4 mg total) by mouth daily. 30 capsule 2   rivaroxaban (XARELTO) 20 MG TABS tablet TAKE 1 TABLET BY MOUTH ONCE A DAY WITH SUPPER 30 tablet 2   No current facility-administered medications for this visit.    REVIEW OF SYSTEMS (negative unless checked):   Cardiac:  []  Chest pain or chest pressure? []  Shortness of breath upon activity? []  Shortness of breath when lying flat? []  Irregular heart rhythm?  Vascular:  [x]  Pain in calf, thigh, or hip brought on by walking? []  Pain in feet at night that wakes you up from your sleep? []  Blood clot in your veins? []  Leg swelling?  Pulmonary:  []  Oxygen at home? []  Productive cough? []  Wheezing?  Neurologic:  []  Sudden weakness in arms or legs? []  Sudden numbness in arms or legs? []  Sudden onset of difficult speaking or slurred speech? []  Temporary loss of vision in one eye? []  Problems with dizziness?  Gastrointestinal:  []  Blood in stool? []  Vomited blood?  Genitourinary:  []  Burning when urinating? []  Blood in urine?  Psychiatric:  []  Major depression  Hematologic:  []  Bleeding problems? []  Problems with blood clotting?  Dermatologic:  []  Rashes or  ulcers?  Constitutional:  []  Fever or chills?  Ear/Nose/Throat:  []  Change in hearing? []  Nose bleeds? []  Sore throat?  Musculoskeletal:  []  Back pain? []  Joint pain? []  Muscle pain?   Physical Examination   Vitals:   12/06/22 1431  BP: (!) 158/88  Pulse: 68  Temp: 98 F (36.7 C)  TempSrc: Temporal  SpO2: 98%  Weight: 185 lb (83.9 kg)   Body mass index is 23.75 kg/m.  General:  WDWN in NAD; vital signs documented above Gait: Not observed HENT: WNL, normocephalic Pulmonary: normal non-labored breathing  Cardiac: regular Abdomen: soft, NT, no masses Skin: without rashes Vascular Exam/Pulses: palpable bilateral femoral pulses. Nonpalpable right popliteal or pedal pulses. Soft monophasic right DP/PT doppler signals Extremities: without ischemic changes, without gangrene , without cellulitis; without open wounds;  Musculoskeletal: no muscle wasting or atrophy  Neurologic: A&O X 3;  No focal weakness or paresthesias are detected Psychiatric:  The pt has Normal affect.  Non-Invasive Vascular imaging   ABI (12/06/2022) R:  ABI: 0.47 (0.55),  PT: mono DP: mono TBI:  0 L:  ABI: 1.02 (1.27),  PT: bi DP: bi TBI: 0  RLE Arterial Duplex (12/06/2022)  ----+  RIGHT     PSV cm/sRatioStenosisWaveform            Comments                +-----------+--------+-----+--------+--------------------+-----------------  ----+  CFA Distal 63                   triphasic                                   +-----------+--------+-----+--------+--------------------+-----------------  ----+  DFA       94                   triphasic                                   +-----------+--------+-----+--------+--------------------+-----------------  ----+  SFA Prox                occluded                                            +-----------+--------+-----+--------+--------------------+-----------------  ----+  SFA Mid                 occluded                                             +-----------+--------+-----+--------+--------------------+-----------------  ----+  SFA Distal 7                    significantly       partially                                               dampened monophasic reconstituted by                                                            collaterals             +-----------+--------+-----+--------+--------------------+-----------------  ----+  POP Prox   145                  monophasic          reconstituted by                                                            collaterals             +-----------+--------+-----+--------+--------------------+-----------------  ----+  POP Distal 15                   monophasic                                  +-----------+--------+-----+--------+--------------------+-----------------  ----+  TP Trunk   18                   monophasic                                  +-----------+--------+-----+--------+--------------------+-----------------  ----+  ATA Prox   13                   monophasic                                  +-----------+--------+-----+--------+--------------------+-----------------  ----+  ATA Distal 11                   monophasic                                  +-----------+--------+-----+--------+--------------------+-----------------  ----+  PTA Distal 9                    monophasic                                  +-----------+--------+-----+--------+--------------------+-----------------  ----+  PERO Mid   13                   monophasic                                  +-----------+--------+-----+--------+--------------------+-----------------  ----+  PERO Distal4                    barely detectable                                                           monophasic                                   +-----------+--------+-----+--------+--------------------+-----------------   Medical Decision Making   Randy Andersen is a 67 y.o. male who presents for follow up of PAD  Based on the patient's vascular studies, his ABIs have decreased bilaterally since last year.  His left ABI has decreased from 1.27 to 1.02.  His right ABI has decreased from 0.55 to 0.47.  He has monophasic flow in his right lower extremity He has a history of right lower extremity claudication, which has now gotten quite severe.  He cannot walk more than 100 feet without severe claudication.  [name] from working. Arterial duplex of the right lower extremity demonstrates a patent right common femoral and profunda, with long segment chronic SFA occlusion.  His popliteal arteries seems to reconstitute via collaterals and he has monophasic flow in his tibial vessels On exam he has palpable femoral pulses.  He does not have a palpable right popliteal pulse.  His pedal pulses on the right are nonpalpable.  He has a very soft right DP/PT/peroneal Doppler signals Given that the patient's claudication has become lifestyle limiting, I have recommended he undergo angiogram for improved blood flow.  He has significant SFA and tibial disease.  He will likely require abdominal aortogram with right lower extremity runoff, with possible right SFA/popliteal atherectomy and stenting, and possible tibial angioplasty.  He will have to hold his Xarelto prior to angiogram.  I have also recommended that he start taking a daily baby aspirin if he is cleared to by his other providers.  We also may need to limit his contrast during angiogram due to recent elevated creatinines in 2023   [name] PA-C [facility] Office: [phone]  Clinic MD: Randie Heinz

## 2022-12-12 ENCOUNTER — Telehealth: Payer: Self-pay

## 2022-12-12 NOTE — Telephone Encounter (Signed)
Attempted to reach patient to schedule aortogram. Left VM for patient to return call.

## 2022-12-13 NOTE — Telephone Encounter (Signed)
Attempted to reach patient to schedule aortogram. Left VM for patient to return call.  

## 2022-12-19 ENCOUNTER — Ambulatory Visit (HOSPITAL_COMMUNITY)
Admission: RE | Admit: 2022-12-19 | Discharge: 2022-12-19 | Disposition: A | Discharge status: Home / Self Care | Payer: Medicare PPO | Source: Ambulatory Visit | Attending: Physician Assistant | Admitting: Physician Assistant

## 2022-12-19 ENCOUNTER — Encounter: Payer: Self-pay | Admitting: Dietician

## 2022-12-19 ENCOUNTER — Inpatient Hospital Stay: Payer: Medicare PPO | Attending: Physician Assistant

## 2022-12-19 ENCOUNTER — Encounter (HOSPITAL_COMMUNITY): Payer: Self-pay

## 2022-12-19 DIAGNOSIS — C3490 Malignant neoplasm of unspecified part of unspecified bronchus or lung: Secondary | ICD-10-CM | POA: Diagnosis not present

## 2022-12-19 DIAGNOSIS — R911 Solitary pulmonary nodule: Secondary | ICD-10-CM | POA: Diagnosis not present

## 2022-12-19 DIAGNOSIS — J439 Emphysema, unspecified: Secondary | ICD-10-CM | POA: Diagnosis not present

## 2022-12-19 DIAGNOSIS — C3411 Malignant neoplasm of upper lobe, right bronchus or lung: Secondary | ICD-10-CM | POA: Diagnosis not present

## 2022-12-19 DIAGNOSIS — F17210 Nicotine dependence, cigarettes, uncomplicated: Secondary | ICD-10-CM | POA: Insufficient documentation

## 2022-12-19 DIAGNOSIS — C792 Secondary malignant neoplasm of skin: Secondary | ICD-10-CM | POA: Diagnosis not present

## 2022-12-19 DIAGNOSIS — C7931 Secondary malignant neoplasm of brain: Secondary | ICD-10-CM | POA: Insufficient documentation

## 2022-12-19 LAB — CBC WITH DIFFERENTIAL (CANCER CENTER ONLY)
Abs Immature Granulocytes: 0.03 10*3/uL (ref 0.00–0.07)
Basophils Absolute: 0.1 10*3/uL (ref 0.0–0.1)
Basophils Relative: 1 %
Eosinophils Absolute: 0.2 10*3/uL (ref 0.0–0.5)
Eosinophils Relative: 2 %
HCT: 45.2 % (ref 39.0–52.0)
Hemoglobin: 15.3 g/dL (ref 13.0–17.0)
Immature Granulocytes: 0 %
Lymphocytes Relative: 40 %
Lymphs Abs: 2.8 10*3/uL (ref 0.7–4.0)
MCH: 30.1 pg (ref 26.0–34.0)
MCHC: 33.8 g/dL (ref 30.0–36.0)
MCV: 88.8 fL (ref 80.0–100.0)
Monocytes Absolute: 0.6 10*3/uL (ref 0.1–1.0)
Monocytes Relative: 9 %
Neutro Abs: 3.3 10*3/uL (ref 1.7–7.7)
Neutrophils Relative %: 48 %
Platelet Count: 206 10*3/uL (ref 150–400)
RBC: 5.09 MIL/uL (ref 4.22–5.81)
RDW: 14.2 % (ref 11.5–15.5)
WBC Count: 7 10*3/uL (ref 4.0–10.5)
nRBC: 0 % (ref 0.0–0.2)

## 2022-12-19 LAB — CMP (CANCER CENTER ONLY)
ALT: 16 U/L (ref 0–44)
AST: 12 U/L — ABNORMAL LOW (ref 15–41)
Albumin: 4 g/dL (ref 3.5–5.0)
Alkaline Phosphatase: 102 U/L (ref 38–126)
Anion gap: 5 (ref 5–15)
BUN: 19 mg/dL (ref 8–23)
CO2: 26 mmol/L (ref 22–32)
Calcium: 9.3 mg/dL (ref 8.9–10.3)
Chloride: 107 mmol/L (ref 98–111)
Creatinine: 1.73 mg/dL — ABNORMAL HIGH (ref 0.61–1.24)
GFR, Estimated: 42 mL/min — ABNORMAL LOW (ref >60)
Glucose, Bld: 101 mg/dL — ABNORMAL HIGH (ref 70–99)
Potassium: 4.3 mmol/L (ref 3.5–5.1)
Sodium: 138 mmol/L (ref 135–145)
Total Bilirubin: 0.6 mg/dL (ref 0.3–1.2)
Total Protein: 7 g/dL (ref 6.5–8.1)

## 2022-12-19 MED ORDER — IOHEXOL 300 MG/ML  SOLN
80.0000 mL | Freq: Once | INTRAMUSCULAR | Status: AC | PRN
  Administered 2022-12-19: 80 mL via INTRAVENOUS

## 2022-12-19 MED ORDER — SODIUM CHLORIDE (PF) 0.9 % IJ SOLN
INTRAMUSCULAR | Status: AC
  Filled 2022-12-19: qty 50

## 2022-12-19 NOTE — Progress Notes (Signed)
Provided one complimentary case Ensure Plus HP.  

## 2022-12-21 ENCOUNTER — Other Ambulatory Visit: Payer: Self-pay

## 2022-12-21 ENCOUNTER — Inpatient Hospital Stay: Payer: Medicare PPO | Admitting: Internal Medicine

## 2022-12-21 ENCOUNTER — Inpatient Hospital Stay (HOSPITAL_BASED_OUTPATIENT_CLINIC_OR_DEPARTMENT_OTHER): Payer: Medicare PPO | Admitting: Physician Assistant

## 2022-12-21 VITALS — BP 176/95 | HR 81 | Temp 98.2°F | Resp 16 | Wt 189.4 lb

## 2022-12-21 DIAGNOSIS — F17210 Nicotine dependence, cigarettes, uncomplicated: Secondary | ICD-10-CM | POA: Diagnosis not present

## 2022-12-21 DIAGNOSIS — C792 Secondary malignant neoplasm of skin: Secondary | ICD-10-CM | POA: Diagnosis not present

## 2022-12-21 DIAGNOSIS — C3411 Malignant neoplasm of upper lobe, right bronchus or lung: Secondary | ICD-10-CM | POA: Diagnosis not present

## 2022-12-21 DIAGNOSIS — I739 Peripheral vascular disease, unspecified: Secondary | ICD-10-CM

## 2022-12-21 DIAGNOSIS — C7931 Secondary malignant neoplasm of brain: Secondary | ICD-10-CM | POA: Diagnosis not present

## 2022-12-21 NOTE — Telephone Encounter (Signed)
Patient returned call. Scheduled aortogram for 6/24. Instructions reviewed and he verbalized understanding.

## 2022-12-21 NOTE — Progress Notes (Signed)
[facility] OFFICE PROGRESS NOTE  Sondra Come, MD [address] [place] [address]  DIAGNOSIS: Stage IV non-small cell lung cancer, favored to be adenocarcinoma.  He presented with posterior right upper lobe nodule as well as right hilar, infrahilar, subcarinal, and right paratracheal lymphadenopathy.  He also has a 1.4 cm x 1 cm soft tissue nodule in the skin/subcutaneous fat in the left axilla.  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  He was diagnosed in August 2021   Molecular Biomarkers: BIOMARKER(S)         % CFDNA OR AMPLIFICATION       ASSOCIATED FDA-APPROVED THERAPIES        CLINICAL TRIAL AVAILABILITY [id number] 2.9% None    Yes [id number] 0.4% None    Yes [id number] 0.2% None    Yes  PRIOR THERAPY: 1) SRS to the small metastatic brain lesion under the care of Dr. Mitzi Hansen on 03/18/20 2) Radiation to the enlarging right upper lobe lung mass under the care of Dr. Mitzi Hansen. Last dose on 04/06/21.  3)   Palliative systemic chemotherapy with carboplatin for an AUC of 5, Alimta 500 mg/m2, and Keytruda 200 mg IV every 3 weeks. Last dose on 04/27/22. Status post 35 cycles. Starting from cycle #5 he will be on maintenance treatment with Alimta and Keytruda every 3 weeks. His dose of Alimta was reduced to 400 mg/m2. Alimta removed starting from cycle #11 due to renal insuffiencey.   CURRENT THERAPY: Observation   INTERVAL HISTORY: Randy Andersen 67 y.o. male returns to the clinic today for a 91-month follow-up visit.  The patient was diagnosed with stage IV lung cancer and completed 2 years of treatment with immunotherapy with Keytruda.  He has been on observation and feeling well.  He is anxious for his scan results.  Today he denies any fever, chills, or night sweats. He was previously followed by member nutritionist team and sometimes will receive complementary protein supplemental drinks due to social constraints.  His weight and appetite is  stable. He denies any changes with his stable dyspnea on exertion.  He denies any significant cough. He unfortunately continues to smoke about 8 cigarettes per day. I previously gave him Nicoderm patches and lozengers. However, he doesn't like the way the lozenges taste.  He denies any nausea, vomiting, diarrhea, or constipation.  Denies any headache or visual changes.  He follows with Dr. Barbaraann Cao for routine brain imaging due to his history of metastatic disease to the brain and his next follow-up brain MRI is on 01/18/2023.  He also sees his vascular provider next month for his claudication and he is going to undergo a procedure.  Recently had a restaging CT scan performed.  He is here today for evaluation and to review his scan results.  MEDICAL HISTORY: Past Medical History:  Diagnosis Date   Asthma    as a child   Chronic pain disorder    LT leg and foot   Heart murmur    as a child   Hypertension    Malignant neoplasm of upper lobe of right lung (HCC)    Stroke (HCC)    mini stroke - 2015, some tingling in fingers on right     ALLERGIES:  has No Known Allergies.  MEDICATIONS:  Current Outpatient Medications  Medication Sig Dispense Refill   amLODipine (NORVASC) 10 MG tablet Take 10 mg by mouth daily.     Cholecalciferol 100 MCG (4000 UT)  TABS Take 2 tablets by mouth daily.     dexamethasone (DECADRON) 1 MG tablet Take 1 tablet (1 mg total) by mouth daily. 60 tablet 1   diclofenac Sodium (VOLTAREN) 1 % GEL Apply 4 g topically 3 (three) times daily.     doxylamine, Sleep, (UNISOM) 25 MG tablet Take 1 tablet (25 mg total) by mouth at bedtime as needed for sleep. 30 tablet 0   HYDROcodone-acetaminophen (NORCO) 10-325 MG tablet Take 1 tablet by mouth 2 (two) times daily.     lidocaine (LIDODERM) 5 % Place 1 patch onto the skin daily. Remove & Discard patch within 12 hours or as directed by MD 15 patch 0   nicotine (NICODERM CQ) 21 mg/24hr patch Place 1 patch (21 mg total) onto the skin  daily. 28 patch 2   nicotine polacrilex (COMMIT) 2 MG lozenge Take 1 lozenge (2 mg total) by mouth as needed for smoking cessation. 100 tablet 0   oxybutynin (DITROPAN-XL) 10 MG 24 hr tablet Take by mouth.     oxyCODONE-acetaminophen (PERCOCET/ROXICET) 5-325 MG tablet Take 1 tablet by mouth every 6 (six) hours as needed for severe pain. 12 tablet 0   polyethylene glycol powder (GLYCOLAX/MIRALAX) 17 GM/SCOOP powder      potassium chloride SA (KLOR-CON M) 20 MEQ tablet Take 1 tablet (20 mEq total) by mouth daily. 5 tablet 0   prochlorperazine (COMPAZINE) 10 MG tablet Take 1 tablet (10 mg total) by mouth every 6 (six) hours as needed. 30 tablet 2   rivaroxaban (XARELTO) 20 MG TABS tablet TAKE 1 TABLET BY MOUTH ONCE A DAY WITH SUPPER 30 tablet 2   senna-docusate (SENOKOT-S) 8.6-50 MG tablet Take 2 tablets by mouth daily.     tadalafil (CIALIS) 20 MG tablet Take by mouth.     tamsulosin (FLOMAX) 0.4 MG CAPS capsule Take 1 capsule (0.4 mg total) by mouth daily. 30 capsule 2   No current facility-administered medications for this visit.    SURGICAL HISTORY:  Past Surgical History:  Procedure Laterality Date   BUNIONECTOMY Left    had hammer toe surgery also and callus removed   BUNIONECTOMY Right    also had hammer toe surgery and callus removed   LEG SURGERY Left    PT reports he has pins placed in his Lt leg   ORIF TIBIA PLATEAU  10/09/2012   Dr Ophelia Charter   ORIF TIBIA PLATEAU Left 10/08/2012   Procedure: OPEN REDUCTION INTERNAL FIXATION (ORIF) TIBIAL PLATEAU;  Surgeon: Eldred Manges, MD;  Location: [facility] OR;  Service: Orthopedics;  Laterality: Left;   VIDEO BRONCHOSCOPY WITH ENDOBRONCHIAL NAVIGATION N/A 02/25/2020   Procedure: VIDEO BRONCHOSCOPY WITH ENDOBRONCHIAL NAVIGATION with biopsies;  Surgeon: Leslye Peer, MD;  Location: [facility] OR;  Service: Thoracic;  Laterality: N/A;   VIDEO BRONCHOSCOPY WITH ENDOBRONCHIAL ULTRASOUND N/A 02/25/2020   Procedure: VIDEO BRONCHOSCOPY WITH ENDOBRONCHIAL ULTRASOUND;   Surgeon: Leslye Peer, MD;  Location: [facility] OR;  Service: Thoracic;  Laterality: N/A;    REVIEW OF SYSTEMS:   Constitutional: Negative for appetite change, chills, fatigue, fever and unexpected weight change.  HENT: Negative for mouth sores, nosebleeds, sore throat and trouble swallowing.   Eyes: Negative for eye problems and icterus.  Respiratory: Positive for dyspnea on exertion.  Negative for cough, hemoptysis, and wheezing.   Cardiovascular: Negative for chest pain and leg swelling.  Gastrointestinal: Negative for abdominal pain, constipation, diarrhea, nausea and vomiting.  Genitourinary: Negative for bladder incontinence, difficulty urinating, dysuria, frequency and hematuria.  Musculoskeletal: Negative for back pain, gait problem, neck pain and neck stiffness.  Skin: Negative for itching and rash.  Neurological: Negative for dizziness, extremity weakness, gait problem, headaches, light-headedness and seizures.  Hematological: Negative for adenopathy. Does not bruise/bleed easily.  Psychiatric/Behavioral: Negative for confusion, depression and sleep disturbance. The patient is not nervous/anxious.     PHYSICAL EXAMINATION:  Blood pressure (!) 176/95, pulse 81, temperature 98.2 F (36.8 C), temperature source Oral, resp. rate 16, weight 189 lb 6.4 oz (85.9 kg), SpO2 100 %.  ECOG PERFORMANCE STATUS: 1  Physical Exam  Constitutional: Oriented to person, place, and time and well-developed, well-nourished, and in no distress.  HENT:  Head: Normocephalic and atraumatic.  Mouth/Throat: Oropharynx is clear and moist. No oropharyngeal exudate.  Eyes: Conjunctivae are normal. Right eye exhibits no discharge. Left eye exhibits no discharge. No scleral icterus.  Neck: Normal range of motion. Neck supple.  Cardiovascular: Normal rate, regular rhythm, normal heart sounds and intact distal pulses.   Pulmonary/Chest: Effort normal and breath sounds normal. No respiratory distress. No wheezes.  No rales.  Abdominal: Soft. Bowel sounds are normal. Exhibits no distension and no mass. There is no tenderness.  Musculoskeletal: Normal range of motion. Exhibits no edema.  Lymphadenopathy:    No cervical adenopathy.  Neurological: Alert and oriented to person, place, and time. Exhibits normal muscle tone. Gait normal. Coordination normal.  Skin: Skin is warm and dry. No rash noted. Not diaphoretic. No erythema. No pallor.  Psychiatric: Mood, memory and judgment normal.  Vitals reviewed.  LABORATORY DATA: Lab Results  Component Value Date   WBC 7.0 12/19/2022   HGB 15.3 12/19/2022   HCT 45.2 12/19/2022   MCV 88.8 12/19/2022   PLT 206 12/19/2022      Chemistry      Component Value Date/Time   NA 138 12/19/2022 1057   K 4.3 12/19/2022 1057   CL 107 12/19/2022 1057   CO2 26 12/19/2022 1057   BUN 19 12/19/2022 1057   CREATININE 1.73 (H) 12/19/2022 1057      Component Value Date/Time   CALCIUM 9.3 12/19/2022 1057   ALKPHOS 102 12/19/2022 1057   AST 12 (L) 12/19/2022 1057   ALT 16 12/19/2022 1057   BILITOT 0.6 12/19/2022 1057       RADIOGRAPHIC STUDIES:  CT Chest W Contrast  Result Date: 12/19/2022 CLINICAL DATA:  Metastatic lung cancer; * Tracking Code: BO * EXAM: CT CHEST, ABDOMEN, AND PELVIS WITH CONTRAST TECHNIQUE: Multidetector CT imaging of the chest, abdomen and pelvis was performed following the standard protocol during bolus administration of intravenous contrast. RADIATION DOSE REDUCTION: This exam was performed according to the departmental dose-optimization program which includes automated exposure control, adjustment of the mA and/or kV according to patient size and/or use of iterative reconstruction technique. CONTRAST:  80mL OMNIPAQUE IOHEXOL 300 MG/ML  SOLN COMPARISON:  CT chest, abdomen and pelvis dated August 17, 2022 FINDINGS: CT CHEST FINDINGS Cardiovascular: Normal heart size. No pericardial effusion. Normal caliber thoracic aorta with mild  atherosclerotic disease. Moderate coronary artery calcifications. Mediastinum/Nodes: Esophagus and thyroid are unremarkable. No enlarged lymph nodes seen in the chest. Lungs/Pleura: Mild tracheal debris. Mild centrilobular emphysema. Stable upper lobe and superior right lower lobe postradiation change. Stable subsolid nodule left upper lobe measuring 5 mm, unchanged when compared with the prior exam. No new or enlarging pulmonary nodules. No pleural effusion. Musculoskeletal: No chest wall mass or suspicious bone lesions identified. CT ABDOMEN PELVIS FINDINGS Hepatobiliary: No focal liver abnormality is seen.  No gallstones, gallbladder wall thickening, or biliary dilatation. Pancreas: Unremarkable. No pancreatic ductal dilatation or surrounding inflammatory changes. Spleen: Normal in size without focal abnormality. Adrenals/Urinary Tract: Bilateral adrenal glands are unremarkable. No hydronephrosis or nephrolithiasis. No suspicious renal lesions. Bladder is unremarkable. Stomach/Bowel: Stomach is within normal limits. No evidence of bowel wall thickening, distention, or inflammatory changes. Vascular/Lymphatic: Aortic atherosclerosis. No enlarged abdominal or pelvic lymph nodes. Reproductive: Mild prostatomegaly. Other: No abdominal wall hernia or abnormality. No abdominopelvic ascites. Musculoskeletal: No acute or significant osseous findings. IMPRESSION: 1. Stable right upper lobe and superior right lower lobe postradiation change. No evidence of recurrent or metastatic disease in the chest. 2. Stable 5 mm subsolid left upper lobe pulmonary nodule. 3. No evidence of metastatic disease in the abdomen or pelvis. 4. Aortic Atherosclerosis (ICD10-I70.0) and Emphysema (ICD10-J43.9). Electronically Signed   By: Allegra Lai M.D.   On: 12/19/2022 13:07   CT Abdomen Pelvis W Contrast  Result Date: 12/19/2022 CLINICAL DATA:  Metastatic lung cancer; * Tracking Code: BO * EXAM: CT CHEST, ABDOMEN, AND PELVIS WITH  CONTRAST TECHNIQUE: Multidetector CT imaging of the chest, abdomen and pelvis was performed following the standard protocol during bolus administration of intravenous contrast. RADIATION DOSE REDUCTION: This exam was performed according to the departmental dose-optimization program which includes automated exposure control, adjustment of the mA and/or kV according to patient size and/or use of iterative reconstruction technique. CONTRAST:  80mL OMNIPAQUE IOHEXOL 300 MG/ML  SOLN COMPARISON:  CT chest, abdomen and pelvis dated August 17, 2022 FINDINGS: CT CHEST FINDINGS Cardiovascular: Normal heart size. No pericardial effusion. Normal caliber thoracic aorta with mild atherosclerotic disease. Moderate coronary artery calcifications. Mediastinum/Nodes: Esophagus and thyroid are unremarkable. No enlarged lymph nodes seen in the chest. Lungs/Pleura: Mild tracheal debris. Mild centrilobular emphysema. Stable upper lobe and superior right lower lobe postradiation change. Stable subsolid nodule left upper lobe measuring 5 mm, unchanged when compared with the prior exam. No new or enlarging pulmonary nodules. No pleural effusion. Musculoskeletal: No chest wall mass or suspicious bone lesions identified. CT ABDOMEN PELVIS FINDINGS Hepatobiliary: No focal liver abnormality is seen. No gallstones, gallbladder wall thickening, or biliary dilatation. Pancreas: Unremarkable. No pancreatic ductal dilatation or surrounding inflammatory changes. Spleen: Normal in size without focal abnormality. Adrenals/Urinary Tract: Bilateral adrenal glands are unremarkable. No hydronephrosis or nephrolithiasis. No suspicious renal lesions. Bladder is unremarkable. Stomach/Bowel: Stomach is within normal limits. No evidence of bowel wall thickening, distention, or inflammatory changes. Vascular/Lymphatic: Aortic atherosclerosis. No enlarged abdominal or pelvic lymph nodes. Reproductive: Mild prostatomegaly. Other: No abdominal wall hernia or  abnormality. No abdominopelvic ascites. Musculoskeletal: No acute or significant osseous findings. IMPRESSION: 1. Stable right upper lobe and superior right lower lobe postradiation change. No evidence of recurrent or metastatic disease in the chest. 2. Stable 5 mm subsolid left upper lobe pulmonary nodule. 3. No evidence of metastatic disease in the abdomen or pelvis. 4. Aortic Atherosclerosis (ICD10-I70.0) and Emphysema (ICD10-J43.9). Electronically Signed   By: Allegra Lai M.D.   On: 12/19/2022 13:07   VAS [place] ABI WITH/WO TBI  Result Date: 12/06/2022  LOWER EXTREMITY DOPPLER STUDY Patient Name:  Randy Andersen  Date of Exam:   12/06/2022 Medical Rec #: [id number]    Accession #:    [id number] Date of Birth: [birth year]    Patient Gender: M Patient Age:   32 years Exam Location:  [facility] Procedure:      VAS [place] ABI WITH/WO TBI Referring Phys: Lemar Livings --------------------------------------------------------------------------------  Indications: Peripheral artery  disease. High Risk Factors: Hypertension, past history of smoking, prior CVA.  Comparison Study: 05/10/2022 ABI/TBI- right=0.55/0.48, left=1.27/0.87 Performing Technologist: Gertie Fey MHA, RVT, RDCS, RDMS  Examination Guidelines: A complete evaluation includes at minimum, Doppler waveform signals and systolic blood pressure reading at the level of bilateral brachial, anterior tibial, and posterior tibial arteries, when vessel segments are accessible. Bilateral testing is considered an integral part of a complete examination. Photoelectric Plethysmograph (PPG) waveforms and toe systolic pressure readings are included as required and additional duplex testing as needed. Limited examinations for reoccurring indications may be performed as noted.  ABI Findings: +--------+------------------+-----+-------------------------+--------+ Right   Rt Pressure (mmHg)IndexWaveform                 Comment   +--------+------------------+-----+-------------------------+--------+ [id number]                                                      +--------+------------------+-----+-------------------------+--------+ PTA     45                0.22 Barely audible monophasic         +--------+------------------+-----+-------------------------+--------+ DP      96                0.47 monophasic                        +--------+------------------+-----+-------------------------+--------+ +--------+------------------+-----+--------+-------+ Left    Lt Pressure (mmHg)IndexWaveformComment +--------+------------------+-----+--------+-------+ [id number]                                    +--------+------------------+-----+--------+-------+ PTA     210               1.02 biphasic        +--------+------------------+-----+--------+-------+ DP      208               1.01 biphasic        +--------+------------------+-----+--------+-------+ +-------+-----------+-----------+------------+------------+ ABI/TBIToday's ABIToday's TBIPrevious ABIPrevious TBI +-------+-----------+-----------+------------+------------+ Right  0.47       0.00       0.55        0.48         +-------+-----------+-----------+------------+------------+ Left   1.02       0.00       1.27        0.87         +-------+-----------+-----------+------------+------------+  Bilateral ABIs appear essentially unchanged compared to prior study on 05/10/2022. Bilateral TBIs appear essentially unchanged compared to prior study on 05/10/2022.  Summary: Right: Resting right ankle-brachial index indicates severe right lower extremity arterial disease. The right toe-brachial index is abnormal. Left: Resting left ankle-brachial index is within normal range. The left toe-brachial index is abnormal. *See table(s) above for measurements and observations.  Electronically signed by Lemar Livings MD on 12/06/2022 at 4:23:13 PM.     Final    VAS [place] LOWER EXTREMITY ARTERIAL DUPLEX  Result Date: 12/06/2022 LOWER EXTREMITY ARTERIAL DUPLEX STUDY Patient Name:  TACUMA LAUERMAN  Date of Exam:   12/06/2022 Medical Rec #: [id number]    Accession #:    [id number] Date of Birth: February 09, 1956    Patient Gender: M Patient Age:   2 years Exam Location:  [facility] Procedure:  VAS [place] LOWER EXTREMITY ARTERIAL DUPLEX Referring Phys: Lemar Livings --------------------------------------------------------------------------------  Indications: Peripheral artery disease. High Risk Factors: Hypertension, past history of smoking, prior CVA.  Current ABI: right=0.47/0.00, left=1.02/0.00 Comparison Study: No prior study Performing Technologist: Gertie Fey MHA, RDMS, RVT, RDCS  Examination Guidelines: A complete evaluation includes B-mode imaging, spectral Doppler, color Doppler, and power Doppler as needed of all accessible portions of each vessel. Bilateral testing is considered an integral part of a complete examination. Limited examinations for reoccurring indications may be performed as noted.  +-----------+--------+-----+--------+--------------------+---------------------+ RIGHT      PSV cm/sRatioStenosisWaveform            Comments              +-----------+--------+-----+--------+--------------------+---------------------+ CFA Distal 63                   triphasic                                 +-----------+--------+-----+--------+--------------------+---------------------+ DFA        94                   triphasic                                 +-----------+--------+-----+--------+--------------------+---------------------+ SFA Prox                occluded                                          +-----------+--------+-----+--------+--------------------+---------------------+ SFA Mid                 occluded                                           +-----------+--------+-----+--------+--------------------+---------------------+ SFA Distal 7                    significantly       partially                                             dampened monophasic reconstituted by                                                          collaterals           +-----------+--------+-----+--------+--------------------+---------------------+ POP Prox   145                  monophasic          reconstituted by  collaterals           +-----------+--------+-----+--------+--------------------+---------------------+ POP Distal 15                   monophasic                                +-----------+--------+-----+--------+--------------------+---------------------+ TP Trunk   18                   monophasic                                +-----------+--------+-----+--------+--------------------+---------------------+ ATA Prox   13                   monophasic                                +-----------+--------+-----+--------+--------------------+---------------------+ ATA Distal 11                   monophasic                                +-----------+--------+-----+--------+--------------------+---------------------+ PTA Distal 9                    monophasic                                +-----------+--------+-----+--------+--------------------+---------------------+ PERO Mid   13                   monophasic                                +-----------+--------+-----+--------+--------------------+---------------------+ PERO Distal4                    barely detectable                                                         monophasic                                +-----------+--------+-----+--------+--------------------+---------------------+  Summary: Right: 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 Flow is  reconituted at the distal SFA and popliteal artery by collaterals. Dampened three vessel runoff.  See table(s) above for measurements and observations. Electronically signed by Lemar Livings MD on 12/06/2022 at 4:23:02 PM.    Final      ASSESSMENT/PLAN:  This is a very pleasant 67 year old African-American male with stage IV non-small cell lung cancer, favored to be adenocarcinoma.  He presented with posterior right upper lobe nodule as well as right hilar, infrahilar, subcarinal, and right paratracheal lymphadenopathy.  He also has a 1.4 cm x 1 cm soft tissue nodule in the skin/subcutaneous fat in the left axilla.  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  He was diagnosed in August 2021.  Molecular studies by [facility]  are negative for any actionable mutations.   The patient completed SRS to the small metastatic brain lesion under the care of Dr. Mitzi Hansen on 03/18/20   The patient completed his 2 years of systemic treatment. He was on palliative systemic chemotherapy with carboplatin for an AUC of 5, Alimta 500 mg per metered squared, and Keytruda 200 mg IV every 3 weeks.  He is status post 35 cycles. The dose of alimta was reduced to 400 mg/m2 due to renal insufficiency. Alimta was ultimately removed from the treatment plan with cycle #11 due to renal insufficiency.    The patient completed SBRT to the enlarging right upper lobe lung mass under the care of Dr. Mitzi Hansen on 04/06/21.     The patient recently had a restaging CT scan performed.  Dr. Arbutus Ped personally and independently reviewed the scan discussed results with the patient today.  The scan did not show any evidence of disease progression.  Dr. Arbutus Ped recommends that the patient continue on observation with restaging CT scan the chest in 4 months.     He will continue to follow with vascular surgery for his history of claudication.  He will continue to follow closely with neuro-oncology for his history of metastatic  disease to the brain. I gave him a copy of his schedule and reminded him of his MRI appointment next month.   The patient was advised to call immediately if he has any concerning symptoms in the interval. The patient voices understanding of current disease status and treatment options and is in agreement with the current care plan. All questions were answered. The patient knows to call the clinic with any problems, questions or concerns. We can certainly see the patient much sooner if necessary    Orders Placed This Encounter  Procedures   CT CHEST ABDOMEN PELVIS WO CONTRAST    Standing Status:   Future    Standing Expiration Date:   12/21/2023    Order Specific Question:   Preferred imaging location?    Answer:   [facility]    Order Specific Question:   If indicated for the ordered procedure, I authorize the administration of oral contrast media per Radiology protocol    Answer:   Yes    Order Specific Question:   Does the patient have a contrast media/X-ray dye allergy?    Answer:   No   CBC with Differential (Cancer Center Only)    Standing Status:   Future    Standing Expiration Date:   12/21/2023   CMP (Cancer Center only)    Standing Status:   Future    Standing Expiration Date:   12/21/2023      Johnette Abraham Belia Febo, PA-C 12/21/22  ADDENDUM: Hematology/Oncology Attending: I had a face-to-face encounter with the patient today.  I reviewed his record, lab, scan and recommended his care plan.  This is a very pleasant 67 years old African-American male with stage IV non-small cell lung cancer with no actionable mutations.  The patient is status post SRS to small brain metastasis followed by radiation to the enlarging right upper lobe lung nodule.  He was then treated with systemic chemotherapy initially with carboplatin, Alimta and Keytruda for 4 cycles followed by maintenance treatment initiated with Alimta and Keytruda and then single agent Keytruda for total of 2  years. The patient has been in observation for the last several months and he is feeling fine with no concerning complaints. He had repeat CT scan of the chest,  abdomen and pelvis performed recently.  I personally and independently reviewed the scan and discussed the result with the patient today. His scan showed no concerning findings for disease progression. I recommended for him to continue on observation with repeat CT scan of the chest in 4 months. The patient was advised to call immediately if he has any concerning symptoms in the interval. Disclaimer: This note was dictated with voice recognition software. Similar sounding words can inadvertently be transcribed and may be missed upon review. Lajuana Matte, MD

## 2022-12-28 DIAGNOSIS — C73 Malignant neoplasm of thyroid gland: Secondary | ICD-10-CM | POA: Diagnosis not present

## 2022-12-29 ENCOUNTER — Other Ambulatory Visit (HOSPITAL_COMMUNITY): Payer: Self-pay | Admitting: Surgery

## 2022-12-29 DIAGNOSIS — C73 Malignant neoplasm of thyroid gland: Secondary | ICD-10-CM

## 2023-01-11 ENCOUNTER — Telehealth: Payer: Self-pay

## 2023-01-11 NOTE — Telephone Encounter (Signed)
I called patient to inform him that only half of his authorization was approved for an angiogram procedure scheduled for 01/15/23.  The angiogram and the SFA Angioplasty is approved, while the SFA Angioplasty, stent and Atherectomy, and the Tibial/Peroneal Angioplasty are denied.  I left him a voice message to advise him that Dr. Randie Heinz may have to do his procedure on 2 separate dates.

## 2023-01-15 ENCOUNTER — Ambulatory Visit (HOSPITAL_COMMUNITY)
Admission: RE | Admit: 2023-01-15 | Discharge: 2023-01-15 | Disposition: A | Discharge status: Home / Self Care | Payer: Medicare PPO | Attending: Vascular Surgery | Admitting: Vascular Surgery

## 2023-01-15 ENCOUNTER — Encounter (HOSPITAL_COMMUNITY)
Admission: RE | Disposition: A | Discharge status: Home / Self Care | Payer: Self-pay | Source: Home / Self Care | Attending: Vascular Surgery

## 2023-01-15 ENCOUNTER — Other Ambulatory Visit: Payer: Self-pay

## 2023-01-15 DIAGNOSIS — F17210 Nicotine dependence, cigarettes, uncomplicated: Secondary | ICD-10-CM | POA: Insufficient documentation

## 2023-01-15 DIAGNOSIS — I70211 Atherosclerosis of native arteries of extremities with intermittent claudication, right leg: Secondary | ICD-10-CM | POA: Insufficient documentation

## 2023-01-15 DIAGNOSIS — I70212 Atherosclerosis of native arteries of extremities with intermittent claudication, left leg: Secondary | ICD-10-CM | POA: Diagnosis not present

## 2023-01-15 DIAGNOSIS — Z7901 Long term (current) use of anticoagulants: Secondary | ICD-10-CM | POA: Insufficient documentation

## 2023-01-15 DIAGNOSIS — I739 Peripheral vascular disease, unspecified: Secondary | ICD-10-CM

## 2023-01-15 HISTORY — PX: PERIPHERAL VASCULAR INTERVENTION: CATH118257

## 2023-01-15 HISTORY — PX: ABDOMINAL AORTOGRAM W/LOWER EXTREMITY: CATH118223

## 2023-01-15 HISTORY — PX: PERIPHERAL VASCULAR ATHERECTOMY: CATH118256

## 2023-01-15 LAB — POCT I-STAT, CHEM 8
BUN: 17 mg/dL (ref 8–23)
Calcium, Ion: 1.25 mmol/L (ref 1.15–1.40)
Chloride: 106 mmol/L (ref 98–111)
Creatinine, Ser: 1.7 mg/dL — ABNORMAL HIGH (ref 0.61–1.24)
Glucose, Bld: 89 mg/dL (ref 70–99)
HCT: 46 % (ref 39.0–52.0)
Hemoglobin: 15.6 g/dL (ref 13.0–17.0)
Potassium: 3.4 mmol/L — ABNORMAL LOW (ref 3.5–5.1)
Sodium: 140 mmol/L (ref 135–145)
TCO2: 24 mmol/L (ref 22–32)

## 2023-01-15 SURGERY — ABDOMINAL AORTOGRAM W/LOWER EXTREMITY
Anesthesia: LOCAL

## 2023-01-15 MED ORDER — ONDANSETRON HCL 4 MG/2ML IJ SOLN: 4.0000 mg | Freq: Four times a day (QID) | INTRAMUSCULAR | Status: DC | PRN

## 2023-01-15 MED ORDER — CLOPIDOGREL BISULFATE 300 MG PO TABS
ORAL_TABLET | ORAL | Status: AC
  Filled 2023-01-15: qty 1

## 2023-01-15 MED ORDER — SODIUM CHLORIDE 0.9 % WEIGHT BASED INFUSION
1.0000 mL/kg/h | INTRAVENOUS | Status: DC
Start: 2023-01-15 — End: 2023-01-15

## 2023-01-15 MED ORDER — LIDOCAINE HCL (PF) 1 % IJ SOLN
INTRAMUSCULAR | Status: DC | PRN
  Administered 2023-01-15: 15 mL via INTRADERMAL

## 2023-01-15 MED ORDER — LABETALOL HCL 5 MG/ML IV SOLN: 10.0000 mg | INTRAVENOUS | Status: DC | PRN

## 2023-01-15 MED ORDER — HEPARIN (PORCINE) IN NACL 1000-0.9 UT/500ML-% IV SOLN
INTRAVENOUS | Status: DC | PRN
  Administered 2023-01-15 (×2): 500 mL

## 2023-01-15 MED ORDER — LABETALOL HCL 5 MG/ML IV SOLN
INTRAVENOUS | Status: AC
  Filled 2023-01-15: qty 4

## 2023-01-15 MED ORDER — CLOPIDOGREL BISULFATE 75 MG PO TABS: 75.0000 mg | ORAL_TABLET | Freq: Every day | ORAL | 11 refills | Status: AC

## 2023-01-15 MED ORDER — SODIUM CHLORIDE 0.9 % IV SOLN: INTRAVENOUS | Status: DC

## 2023-01-15 MED ORDER — HEPARIN SODIUM (PORCINE) 1000 UNIT/ML IJ SOLN
INTRAMUSCULAR | Status: DC | PRN
  Administered 2023-01-15: 8000 [IU] via INTRAVENOUS

## 2023-01-15 MED ORDER — SODIUM CHLORIDE 0.9% FLUSH: 3.0000 mL | Freq: Two times a day (BID) | INTRAVENOUS | Status: DC

## 2023-01-15 MED ORDER — ROSUVASTATIN CALCIUM 10 MG PO TABS: 10.0000 mg | ORAL_TABLET | Freq: Every day | ORAL | 11 refills | Status: AC

## 2023-01-15 MED ORDER — LABETALOL HCL 5 MG/ML IV SOLN
INTRAVENOUS | Status: DC | PRN
  Administered 2023-01-15: 10 mg via INTRAVENOUS
  Administered 2023-01-15: 20 mg via INTRAVENOUS
  Administered 2023-01-15: 10 mg via INTRAVENOUS

## 2023-01-15 MED ORDER — CLOPIDOGREL BISULFATE 75 MG PO TABS: 300.0000 mg | ORAL_TABLET | Freq: Once | ORAL | Status: DC

## 2023-01-15 MED ORDER — OXYCODONE HCL 5 MG PO TABS
5.0000 mg | ORAL_TABLET | ORAL | Status: DC | PRN
Start: 2023-01-15 — End: 2023-01-15

## 2023-01-15 MED ORDER — ACETAMINOPHEN 325 MG PO TABS: 650.0000 mg | ORAL_TABLET | ORAL | Status: DC | PRN

## 2023-01-15 MED ORDER — CLOPIDOGREL BISULFATE 300 MG PO TABS
ORAL_TABLET | ORAL | Status: DC | PRN
  Administered 2023-01-15: 300 mg via ORAL

## 2023-01-15 MED ORDER — CLOPIDOGREL BISULFATE 75 MG PO TABS
75.0000 mg | ORAL_TABLET | Freq: Every day | ORAL | Status: DC
Start: 2023-01-16 — End: 2023-01-15

## 2023-01-15 MED ORDER — MIDAZOLAM HCL 2 MG/2ML IJ SOLN
INTRAMUSCULAR | Status: AC
  Filled 2023-01-15: qty 2

## 2023-01-15 MED ORDER — FENTANYL CITRATE (PF) 100 MCG/2ML IJ SOLN
INTRAMUSCULAR | Status: AC
  Filled 2023-01-15: qty 2

## 2023-01-15 MED ORDER — ROSUVASTATIN CALCIUM 10 MG PO TABS: 10.0000 mg | ORAL_TABLET | Freq: Every day | ORAL | Status: DC

## 2023-01-15 MED ORDER — SODIUM CHLORIDE 0.9 % IV SOLN: 250.0000 mL | INTRAVENOUS | Status: DC | PRN

## 2023-01-15 MED ORDER — LIDOCAINE HCL (PF) 1 % IJ SOLN
INTRAMUSCULAR | Status: AC
  Filled 2023-01-15: qty 30

## 2023-01-15 MED ORDER — SODIUM CHLORIDE 0.9% FLUSH: 3.0000 mL | INTRAVENOUS | Status: DC | PRN

## 2023-01-15 MED ORDER — IODIXANOL 320 MG/ML IV SOLN
INTRAVENOUS | Status: DC | PRN
  Administered 2023-01-15: 80 mL

## 2023-01-15 MED ORDER — FENTANYL CITRATE (PF) 100 MCG/2ML IJ SOLN
INTRAMUSCULAR | Status: DC | PRN
  Administered 2023-01-15: 50 ug via INTRAVENOUS

## 2023-01-15 MED ORDER — MIDAZOLAM HCL 2 MG/2ML IJ SOLN
INTRAMUSCULAR | Status: DC | PRN
  Administered 2023-01-15: 1 mg via INTRAVENOUS

## 2023-01-15 MED ORDER — HYDRALAZINE HCL 20 MG/ML IJ SOLN: 5.0000 mg | INTRAMUSCULAR | Status: DC | PRN

## 2023-01-15 SURGICAL SUPPLY — 23 items
BALLN STERLING OTW 5X220X150 (BALLOONS) ×2
BALLOON STERLING OTW 5X220X150 (BALLOONS) IMPLANT
CATH AURYON ATHERECTOMY 2.0 (CATHETERS) IMPLANT
CATH OMNI FLUSH 5F 65CM (CATHETERS) IMPLANT
CATH QUICKCROSS SUPP .035X90CM (MICROCATHETER) IMPLANT
CLOSURE MYNX CONTROL 6F/7F (Vascular Products) IMPLANT
GLIDEWIRE ADV .035X260CM (WIRE) IMPLANT
KIT ANGIASSIST CO2 SYSTEM (KITS) IMPLANT
KIT ENCORE 26 ADVANTAGE (KITS) IMPLANT
KIT MICROPUNCTURE NIT STIFF (SHEATH) IMPLANT
KIT PV (KITS) ×2 IMPLANT
SHEATH CATAPULT 6F 45 MP (SHEATH) IMPLANT
SHEATH PINNACLE 5F 10CM (SHEATH) IMPLANT
SHEATH PINNACLE 6F 10CM (SHEATH) IMPLANT
SHEATH PROBE COVER 6X72 (BAG) IMPLANT
STENT ELUVIA 6X150X130 (Permanent Stent) IMPLANT
STOPCOCK MORSE 400PSI 3WAY (MISCELLANEOUS) IMPLANT
SYR MEDRAD MARK 7 150ML (SYRINGE) ×2 IMPLANT
TRANSDUCER W/STOPCOCK (MISCELLANEOUS) ×2 IMPLANT
TRAY PV CATH (CUSTOM PROCEDURE TRAY) ×2 IMPLANT
TUBING CIL FLEX 10 FLL-RA (TUBING) IMPLANT
WIRE BENTSON .035X145CM (WIRE) IMPLANT
WIRE SPARTACORE .014X300CM (WIRE) IMPLANT

## 2023-01-15 NOTE — H&P (Signed)
Randy Andersen is a 67 y.o. ([birth year]) male who presents for PAD follow-up.  He was previously evaluated by Dr. Randie Andersen for right lower extremity claudication.  This began sometime in 2023.  He has no prior history of vascular interventions.  He does have a history of lung cancer with brain mets, which is actively being monitored.   He returns today for follow-up.  He endorses worsening symptoms of claudication in his right calf.  [name].  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  [name].   [name].  [name].  He takes Xarelto daily.  He was previously recommended to take a daily baby aspirin by Dr. Randie Andersen, however he has not started it. [name]          Current Outpatient Medications  Medication Sig Dispense Refill   amLODipine (NORVASC) 10 MG tablet Take 10 mg by mouth daily.       Cholecalciferol 100 MCG (4000 UT) TABS Take 2 tablets by mouth daily.       dexamethasone (DECADRON) 1 MG tablet Take 1 tablet (1 mg total) by mouth daily. 60 tablet 1   diclofenac Sodium (VOLTAREN) 1 % GEL Apply 4 g topically 3 (three) times daily.       doxylamine, Sleep, (UNISOM) 25 MG tablet Take 1 tablet (25 mg total) by mouth at bedtime as needed for sleep. 30 tablet 0   HYDROcodone-acetaminophen (NORCO) 10-325 MG tablet Take 1 tablet by mouth 2 (two) times daily.       lidocaine (LIDODERM) 5 % Place 1 patch onto the skin daily. Remove & Discard patch within 12 hours or as directed by MD 15 patch 0   nicotine (NICODERM CQ) 21 mg/24hr patch Place 1 patch (21 mg total) onto the skin daily. 28 patch 2   nicotine polacrilex (COMMIT) 2 MG lozenge Take 1 lozenge (2 mg total) by  mouth as needed for smoking cessation. 100 tablet 0   oxybutynin (DITROPAN-XL) 10 MG 24 hr tablet Take by mouth.       oxyCODONE-acetaminophen (PERCOCET/ROXICET) 5-325 MG tablet Take 1 tablet by mouth every 6 (six) hours as needed for severe pain. 12 tablet 0   polyethylene glycol powder (GLYCOLAX/MIRALAX) 17 GM/SCOOP powder         potassium chloride SA (KLOR-CON M) 20 MEQ tablet Take 1 tablet (20 mEq total) by mouth daily. 5 tablet 0   prochlorperazine (COMPAZINE) 10 MG tablet Take 1 tablet (10 mg total) by mouth every 6 (six) hours as needed. 30 tablet 2   senna-docusate (SENOKOT-S) 8.6-50 MG tablet Take 2 tablets by mouth daily.       tadalafil (CIALIS) 20 MG tablet Take by mouth.       tamsulosin (FLOMAX) 0.4 MG CAPS capsule Take 1 capsule (0.4 mg total) by mouth daily. 30 capsule 2   rivaroxaban (XARELTO) 20 MG TABS tablet TAKE 1 TABLET BY MOUTH ONCE A DAY WITH SUPPER 30 tablet 2    No current facility-administered medications for this visit.      REVIEW OF SYSTEMS (negative unless checked):    Cardiac:  []  Chest pain or  chest pressure? []  Shortness of breath upon activity? []  Shortness of breath when lying flat? []  Irregular heart rhythm?   Vascular:  [x]  Pain in calf, thigh, or hip brought on by walking? []  Pain in feet at night that wakes you up from your sleep? []  Blood clot in your veins? []  Leg swelling?   Pulmonary:  []  Oxygen at home? []  Productive cough? []  Wheezing?   Neurologic:  []  Sudden weakness in arms or legs? []  Sudden numbness in arms or legs? []  Sudden onset of difficult speaking or slurred speech? []  Temporary loss of vision in one eye? []  Problems with dizziness?   Gastrointestinal:  []  Blood in stool? []  Vomited blood?   Genitourinary:  []  Burning when urinating? []  Blood in urine?   Psychiatric:  []  Major depression   Hematologic:  []  Bleeding problems? []  Problems with blood clotting?   Dermatologic:  []  Rashes or ulcers?    Constitutional:  []  Fever or chills?   Ear/Nose/Throat:  []  Change in hearing? []  Nose bleeds? []  Sore throat?   Musculoskeletal:  []  Back pain? []  Joint pain? []  Muscle pain?     Physical Examination  There were no vitals filed for this visit.    General:  WDWN in NAD; vital signs documented above Gait: Not observed HENT: WNL, normocephalic Pulmonary: normal non-labored breathing  Cardiac: regular Abdomen: soft, NT, no masses Skin: without rashes Vascular Exam/Pulses: palpable bilateral femoral pulses. Nonpalpable right popliteal or pedal pulses. Soft monophasic right DP/PT doppler signals Extremities: without ischemic changes, without gangrene , without cellulitis; without open wounds;  Musculoskeletal: no muscle wasting or atrophy       Neurologic: A&O X 3;  No focal weakness or paresthesias are detected Psychiatric:  The pt has Normal affect.   Non-Invasive Vascular imaging    ABI (12/06/2022) R:  ABI: 0.47 (0.55),  PT: mono DP: mono TBI:  0 L:  ABI: 1.02 (1.27),  PT: bi DP: bi TBI: 0   RLE Arterial Duplex (12/06/2022)   ----+  RIGHT     PSV cm/sRatioStenosisWaveform            Comments                +-----------+--------+-----+--------+--------------------+-----------------  ----+  CFA Distal 63                   triphasic                                   +-----------+--------+-----+--------+--------------------+-----------------  ----+  DFA       94                   triphasic                                   +-----------+--------+-----+--------+--------------------+-----------------  ----+  SFA Prox                occluded                                            +-----------+--------+-----+--------+--------------------+-----------------  ----+  SFA Mid                 occluded                                            +-----------+--------+-----+--------+--------------------+-----------------  ----+  SFA Distal 7                    significantly       partially                                               dampened monophasic reconstituted by                                                            collaterals             +-----------+--------+-----+--------+--------------------+-----------------  ----+  POP Prox   145                  monophasic          reconstituted by                                                            collaterals             +-----------+--------+-----+--------+--------------------+-----------------  ----+  POP Distal 15                   monophasic                                  +-----------+--------+-----+--------+--------------------+-----------------  ----+  TP Trunk   18                   monophasic                                  +-----------+--------+-----+--------+--------------------+-----------------  ----+  ATA Prox   13                   monophasic                                  +-----------+--------+-----+--------+--------------------+-----------------  ----+  ATA Distal 11                   monophasic                                  +-----------+--------+-----+--------+--------------------+-----------------  ----+  PTA Distal 9                    monophasic                                  +-----------+--------+-----+--------+--------------------+-----------------  ----+  PERO Mid   13                   monophasic                                  +-----------+--------+-----+--------+--------------------+-----------------  ----+  PERO Distal4                    barely detectable                                                           monophasic                                  +-----------+--------+-----+--------+--------------------+-----------------    Medical Decision Making    Randy Andersen is a 67 y.o. male who presents  for follow up of PAD   Based on the patient's vascular studies, his ABIs have decreased bilaterally since last year.  His left ABI has decreased from 1.27 to 1.02.  His right ABI has decreased from 0.55 to 0.47.  He has monophasic flow in his right lower extremity He has a history of right lower extremity claudication, which has now gotten quite severe.  He cannot walk more than 100 feet without severe claudication.  [name] from working. Arterial duplex of the right lower extremity demonstrates a patent right common femoral and profunda, with long segment chronic SFA occlusion.  His popliteal arteries seems to reconstitute via collaterals and he has monophasic flow in his tibial vessels On exam he has palpable femoral pulses.  He does not have a palpable right popliteal pulse.  His pedal pulses on the right are nonpalpable.  He has a very soft right DP/PT/peroneal Doppler signals Given that the patient's claudication has become lifestyle limiting, I have recommended he undergo angiogram for improved blood flow.  He has significant SFA and tibial disease.  He will likely require abdominal aortogram with right lower extremity runoff, with possible right SFA/popliteal atherectomy and stenting, and possible tibial angioplasty.  He will have to hold his Xarelto prior to angiogram.  I have also recommended that he start taking a daily baby aspirin if he is cleared to by his other providers.  We also may need to limit his contrast during angiogram due to recent elevated creatinines in 2023  Randy Andersen C. Randy Heinz, MD [facility] Office: [phone] Pager: [phone]

## 2023-01-15 NOTE — Progress Notes (Signed)
Patient and daughter was given discharge instructions. Both verbalized understanding. 

## 2023-01-15 NOTE — Op Note (Signed)
    Patient name: Randy Andersen MRN: [id number] DOB: 08-31-1955 Sex: male  01/15/2023 Pre-operative Diagnosis: Life-limiting right lower extremity claudication Post-operative diagnosis:  Same Surgeon:  Apolinar Junes C. Randie Heinz, MD Procedure Performed: 1.  Percutaneous access and Mynx device closure left common femoral artery 2.  Aortogram with bilateral lower extremity runoff 3.  Catheter selection right SFA and popliteal arteries 4.  Laser arthrectomy with 2.0 mm Auryon right SFA and popliteal arteries 5.  Stent right SFA and popliteal arteries with 6 x 150 mm Eluvia x 2 6.  Moderate sedation with fentanyl and Versed for 72 minutes   Indications: 67 year old male with life-limiting right lower extremity claudication now indicated for intervention.  He has known SFA occlusion by previous duplex.  He is indicated for angiography possible intervention.  Findings: Aorta and iliac segments bilateral common femoral arteries are free of flow-limiting stenosis.  Right SFA initially patent with a large collateral and then occludes for very long segment reconstitutes above the knee popliteal artery with runoff via anterior tibial and posterior tibial arteries which fill the foot.  After laser arthrectomy and balloon angioplasty there was initial dissection which was stented to 0% residual stenosis and no dissection.  Patient will need urgent smoking cessation, we will place him on Plavix along with his Xarelto and add Crestor to his medication regimen.   Procedure:  The patient was identified in the holding area and taken to room 8.  The patient was then placed supine on the table and prepped and draped in the usual sterile fashion.  A time out was called.  Ultrasound was used to evaluate the left common femoral artery which did not be patent.  The area was anesthetized 1% lidocaine cannulated with a micropuncture needle followed by wire and a sheath.  And images saved to the permanent record.  We placed a Bentson  wire followed by a 5 French sheath and Omni catheter to the level of L1 with CO2 aortogram with limited bilateral lower extremity angiography was performed.  We then crossed the bifurcation a Bentson wire and Omni catheter perform right lower extremity angiography with contrast.  With the above findings we placed a long 6 French sheath patient was fully heparinized.  We used a quick cross catheter Glidewire advantage to cross the occluded SFA and popliteal arteries and confirmed intraluminal access at the level of the knee and also performed below the knee angiography.  From there we exchanged for an 014 wire and then performed laser arthrectomy and hide hours of the SFA and popliteal artery occluded segment.  We then performed balloon angioplasty with 5 mm balloon and due to residual dissection with stenting primarily with 6 x 150 mm drug-eluting stents x 2 postdilated with 5 mm balloon to 0% residual stenosis.  We then exchanged for a short 6 French sheath put a minx device in the left common femoral artery.  He tolerated procedure without any complication.   Contrast: 80 cc  Orissa Arreaga C. Randie Heinz, MD [facility] Office: [phone] Pager: [phone]

## 2023-01-16 ENCOUNTER — Encounter (HOSPITAL_COMMUNITY): Payer: Self-pay | Admitting: Vascular Surgery

## 2023-01-17 ENCOUNTER — Telehealth: Payer: Self-pay | Admitting: Vascular Surgery

## 2023-01-17 NOTE — Telephone Encounter (Signed)
Appt has been scheduled.

## 2023-01-17 NOTE — Telephone Encounter (Signed)
-----   Message from Maeola Harman, MD sent at 01/15/2023 11:35 AM EDT ----- Randy Andersen [id number] 05-01-1956  01/15/2023 Pre-operative Diagnosis: Life-limiting right lower extremity claudication  Surgeon:  Apolinar Junes C. Randie Heinz, MD  Procedure Performed: 1.  Percutaneous access and Mynx device closure left common femoral artery 2.  Aortogram with bilateral lower extremity runoff 3.  Catheter selection right SFA and popliteal arteries 4.  Laser arthrectomy with 2.0 mm Auryon right SFA and popliteal arteries 5.  Stent right SFA and popliteal arteries with 6 x 150 mm Eluvia x 2 6.  Moderate sedation with fentanyl and Versed for 72 minutes  Please have patient follow-up with me or PA in 4 to 6 weeks with right lower extremity arterial duplex and ABIs.  brandon

## 2023-01-18 ENCOUNTER — Telehealth: Payer: Self-pay | Admitting: *Deleted

## 2023-01-18 ENCOUNTER — Inpatient Hospital Stay: Admission: RE | Admit: 2023-01-18 | Payer: Medicare PPO | Source: Ambulatory Visit

## 2023-01-18 NOTE — Telephone Encounter (Signed)
PC to patient, he missed MRI appointment at [facility] yesterday, patient states he was unaware.  MRI rescheduled for 01/31/23 at 12:10 at [facility].  Informed patient we will reschedule his appointment with Dr Barbaraann Cao until after his MRI so they can discuss results.  Our scheduling department will contact him to reschedule this appointment.  Patient verbalizes understanding.  Scheduling message sent.

## 2023-01-22 ENCOUNTER — Telehealth: Payer: Self-pay | Admitting: Internal Medicine

## 2023-01-22 ENCOUNTER — Inpatient Hospital Stay: Payer: Medicare PPO | Admitting: Internal Medicine

## 2023-01-22 NOTE — Telephone Encounter (Signed)
Scheduled per scheduling message, called and left a voicemail regarding July appointments.

## 2023-01-23 ENCOUNTER — Ambulatory Visit: Payer: Medicare PPO | Admitting: Internal Medicine

## 2023-01-26 ENCOUNTER — Ambulatory Visit (HOSPITAL_COMMUNITY): Admission: RE | Admit: 2023-01-26 | Payer: Medicare PPO | Source: Ambulatory Visit

## 2023-01-26 ENCOUNTER — Encounter (HOSPITAL_COMMUNITY): Payer: Self-pay

## 2023-01-31 ENCOUNTER — Other Ambulatory Visit: Payer: Medicare PPO

## 2023-02-01 ENCOUNTER — Other Ambulatory Visit: Payer: Self-pay | Admitting: *Deleted

## 2023-02-01 DIAGNOSIS — I739 Peripheral vascular disease, unspecified: Secondary | ICD-10-CM

## 2023-02-05 ENCOUNTER — Inpatient Hospital Stay: Payer: Medicare PPO | Attending: Physician Assistant

## 2023-02-06 ENCOUNTER — Telehealth: Payer: Self-pay | Admitting: *Deleted

## 2023-02-06 ENCOUNTER — Inpatient Hospital Stay: Payer: Medicare PPO | Admitting: Internal Medicine

## 2023-02-06 NOTE — Telephone Encounter (Signed)
Received PC from patient, he was returning call he missed from this office this a.m.  Informed patient he missed his brain MRI appointment at [facility] on 01/31/23 so we have canceled his appointment with Dr Barbaraann Cao today.  Patient states he thought it was 02/21/23.  Patient states he will call [facility] to reschedule MRI - phone number given, [phone].  Instructed patient to call this office to let us know when he has scheduled MRI so we can reschedule his appointment with Dr Barbaraann Cao.  Patient verbalizes understanding.

## 2023-02-08 ENCOUNTER — Telehealth: Payer: Self-pay | Admitting: Internal Medicine

## 2023-02-08 NOTE — Telephone Encounter (Signed)
Called patient twice; Left patient message regarding upcoming appointment times/dates; Left callback number for reschedule

## 2023-02-14 ENCOUNTER — Other Ambulatory Visit: Payer: Self-pay | Admitting: Radiation Therapy

## 2023-02-21 ENCOUNTER — Ambulatory Visit (HOSPITAL_COMMUNITY): Payer: Medicare PPO

## 2023-02-22 ENCOUNTER — Ambulatory Visit (HOSPITAL_COMMUNITY): Payer: Medicare PPO

## 2023-02-22 ENCOUNTER — Ambulatory Visit (HOSPITAL_COMMUNITY): Payer: Medicare PPO | Attending: Vascular Surgery

## 2023-02-27 ENCOUNTER — Ambulatory Visit (HOSPITAL_COMMUNITY): Payer: Medicare PPO

## 2023-02-27 ENCOUNTER — Ambulatory Visit (HOSPITAL_COMMUNITY): Payer: Medicare PPO | Attending: Vascular Surgery

## 2023-03-12 ENCOUNTER — Inpatient Hospital Stay: Admission: RE | Admit: 2023-03-12 | Payer: Medicare PPO | Source: Ambulatory Visit

## 2023-03-15 ENCOUNTER — Telehealth: Payer: Self-pay | Admitting: *Deleted

## 2023-03-15 NOTE — Telephone Encounter (Signed)
PC to patient's S/O On[place], called patient earlier - no answer, left VM but patient has not returned called.  Informed Reece Agar that patient missed his brain MRI which was scheduled on 03/12/23.  He has appointment with Dr Barbaraann Cao on 03/19/23 which will need to be rescheduled as well because MRI results were to be discussed at this visit.  Reece Agar states she is currently in the hospital & this is why the patient missed his MRI.  She states she will have the patient contact this office today.

## 2023-03-15 NOTE — Telephone Encounter (Signed)
PC to patient regarding missed MRI appointment, informed him MRI has been rescheduled on 04/17/23 at 10:00 at [facility].  Also informed patient our scheduling department will contact him to set up appointment with Dr Barbaraann Cao a few days after the MRI to discuss results.  Informed patient he hasn't had a MRI since February & this MRI is very important.  Patient verbalizes understanding. Scheduling message sent.

## 2023-03-19 ENCOUNTER — Inpatient Hospital Stay: Payer: No Typology Code available for payment source | Admitting: Internal Medicine

## 2023-03-30 ENCOUNTER — Ambulatory Visit (HOSPITAL_COMMUNITY)
Admission: RE | Admit: 2023-03-30 | Discharge: 2023-03-30 | Disposition: A | Discharge status: Home / Self Care | Payer: Medicare HMO | Source: Ambulatory Visit | Attending: Vascular Surgery | Admitting: Vascular Surgery

## 2023-03-30 ENCOUNTER — Ambulatory Visit (INDEPENDENT_AMBULATORY_CARE_PROVIDER_SITE_OTHER)
Admission: RE | Admit: 2023-03-30 | Discharge: 2023-03-30 | Disposition: A | Discharge status: Home / Self Care | Payer: Medicare HMO | Source: Ambulatory Visit | Attending: Vascular Surgery | Admitting: Vascular Surgery

## 2023-03-30 DIAGNOSIS — I739 Peripheral vascular disease, unspecified: Secondary | ICD-10-CM

## 2023-03-30 LAB — VAS US ABI WITH/WO TBI
Left ABI: 1.02
Right ABI: 0.27

## 2023-04-04 NOTE — Progress Notes (Deleted)
HISTORY AND PHYSICAL     CC:  follow up. Requesting Provider:  Sondra Come, MD  HPI: This is a 67 y.o. male who is here today for follow up for PAD.  Pt has hx of angiogram with laser atherectomy and stent of the right SFA and popliteal arteries on 01/15/2023 by Dr. Randie Heinz for lifestyle limiting RLE claudication.  He was encouraged on urgent smoking cessation and was placed on Plavix along with Xarelto and Crestor for medical management.   Pt has hx of stage IV NSCLC favored to be adenocarcinoma with  lymphadenopathy as well as 2 small metastatic lesions measuring 4mm in the brain.  He was treated with 2 years of immunotherapy with Keytruda.    The pt returns today for follow up.  ***  The pt is on a statin for cholesterol management.    The pt is not on an aspirin.    Other AC:  Plavix/Xarelto The pt is on CCB for hypertension.  The pt is not on medication for diabetes. Tobacco hx:  ***  Pt does *** have family hx of AAA.  Past Medical History:  Diagnosis Date   Asthma    as a child   Chronic pain disorder    LT leg and foot   Heart murmur    as a child   Hypertension    Malignant neoplasm of upper lobe of right lung (HCC)    Stroke (HCC)    mini stroke - 2015, some tingling in fingers on right     Past Surgical History:  Procedure Laterality Date   ABDOMINAL AORTOGRAM W/LOWER EXTREMITY N/A 01/15/2023   Procedure: ABDOMINAL AORTOGRAM W/LOWER EXTREMITY;  Surgeon: Maeola Harman, MD;  Location: [facility] INVASIVE CV LAB;  Service: Cardiovascular;  Laterality: N/A;   BUNIONECTOMY Left    had hammer toe surgery also and callus removed   BUNIONECTOMY Right    also had hammer toe surgery and callus removed   LEG SURGERY Left    PT reports he has pins placed in his Lt leg   ORIF TIBIA PLATEAU  10/09/2012   Dr Ophelia Charter   ORIF TIBIA PLATEAU Left 10/08/2012   Procedure: OPEN REDUCTION INTERNAL FIXATION (ORIF) TIBIAL PLATEAU;  Surgeon: Eldred Manges, MD;  Location: [facility] OR;   Service: Orthopedics;  Laterality: Left;   PERIPHERAL VASCULAR ATHERECTOMY  01/15/2023   Procedure: PERIPHERAL VASCULAR ATHERECTOMY;  Surgeon: Maeola Harman, MD;  Location: [facility] INVASIVE CV LAB;  Service: Cardiovascular;;   PERIPHERAL VASCULAR INTERVENTION  01/15/2023   Procedure: PERIPHERAL VASCULAR INTERVENTION;  Surgeon: Maeola Harman, MD;  Location: [facility] INVASIVE CV LAB;  Service: Cardiovascular;;   VIDEO BRONCHOSCOPY WITH ENDOBRONCHIAL NAVIGATION N/A 02/25/2020   Procedure: VIDEO BRONCHOSCOPY WITH ENDOBRONCHIAL NAVIGATION with biopsies;  Surgeon: Leslye Peer, MD;  Location: [facility] OR;  Service: Thoracic;  Laterality: N/A;   VIDEO BRONCHOSCOPY WITH ENDOBRONCHIAL ULTRASOUND N/A 02/25/2020   Procedure: VIDEO BRONCHOSCOPY WITH ENDOBRONCHIAL ULTRASOUND;  Surgeon: Leslye Peer, MD;  Location: [facility] OR;  Service: Thoracic;  Laterality: N/A;    No Known Allergies  Current Outpatient Medications  Medication Sig Dispense Refill   amLODipine (NORVASC) 10 MG tablet Take 10 mg by mouth daily.     Cholecalciferol 100 MCG (4000 UT) TABS Take 2 tablets by mouth daily.     clopidogrel (PLAVIX) 75 MG tablet Take 1 tablet (75 mg total) by mouth daily. 30 tablet 11   dexamethasone (DECADRON) 1 MG tablet Take 1 tablet (1 mg total)  by mouth daily. 60 tablet 1   diclofenac Sodium (VOLTAREN) 1 % GEL Apply 4 g topically 3 (three) times daily.     doxylamine, Sleep, (UNISOM) 25 MG tablet Take 1 tablet (25 mg total) by mouth at bedtime as needed for sleep. 30 tablet 0   HYDROcodone-acetaminophen (NORCO) 10-325 MG tablet Take 1 tablet by mouth 2 (two) times daily.     lidocaine (LIDODERM) 5 % Place 1 patch onto the skin daily. Remove & Discard patch within 12 hours or as directed by MD 15 patch 0   nicotine (NICODERM CQ) 21 mg/24hr patch Place 1 patch (21 mg total) onto the skin daily. 28 patch 2   nicotine polacrilex (COMMIT) 2 MG lozenge Take 1 lozenge (2 mg total) by mouth as needed for smoking  cessation. 100 tablet 0   oxybutynin (DITROPAN-XL) 10 MG 24 hr tablet Take by mouth.     oxyCODONE-acetaminophen (PERCOCET/ROXICET) 5-325 MG tablet Take 1 tablet by mouth every 6 (six) hours as needed for severe pain. 12 tablet 0   polyethylene glycol powder (GLYCOLAX/MIRALAX) 17 GM/SCOOP powder      potassium chloride SA (KLOR-CON M) 20 MEQ tablet Take 1 tablet (20 mEq total) by mouth daily. 5 tablet 0   prochlorperazine (COMPAZINE) 10 MG tablet Take 1 tablet (10 mg total) by mouth every 6 (six) hours as needed. 30 tablet 2   rivaroxaban (XARELTO) 20 MG TABS tablet TAKE 1 TABLET BY MOUTH ONCE A DAY WITH SUPPER 30 tablet 2   rosuvastatin (CRESTOR) 10 MG tablet Take 1 tablet (10 mg total) by mouth daily. 30 tablet 11   senna-docusate (SENOKOT-S) 8.6-50 MG tablet Take 2 tablets by mouth daily.     tadalafil (CIALIS) 20 MG tablet Take by mouth.     tamsulosin (FLOMAX) 0.4 MG CAPS capsule Take 1 capsule (0.4 mg total) by mouth daily. 30 capsule 2   No current facility-administered medications for this visit.    Family History  Problem Relation Age of Onset   Cancer Mother        Intestinal metastatic to ovaries    Social History   Socioeconomic History   Marital status: Single    Spouse name: Not on file   Number of children: Not on file   Years of education: Not on file   Highest education level: Not on file  Occupational History   Not on file  Tobacco Use   Smoking status: Every Day    Current packs/day: 0.50    Average packs/day: 0.5 packs/day for 28.0 years (14.0 ttl pk-yrs)    Types: Cigarettes   Smokeless tobacco: Never  Vaping Use   Vaping status: Never Used  Substance and Sexual Activity   Alcohol use: No   Drug use: No   Sexual activity: Not on file  Other Topics Concern   Not on file  Social History Narrative   Not on file   Social Determinants of Health   Financial Resource Strain: Medium Risk (03/11/2020)   Overall Financial Resource Strain (CARDIA)     Difficulty of Paying Living Expenses: Somewhat hard  Food Insecurity: No Food Insecurity (03/11/2020)   Hunger Vital Sign    Worried About Running Out of Food in the Last Year: Never true    Ran Out of Food in the Last Year: Never true  Transportation Needs: Unmet Transportation Needs (02/10/2022)   PRAPARE - Administrator, Civil Service (Medical): Yes    Lack of Transportation (Non-Medical): Yes  Physical Activity: Insufficiently Active (03/11/2020)   Exercise Vital Sign    Days of Exercise per Week: 3 days    Minutes of Exercise per Session: 20 min  Stress: Not on file  Social Connections: Socially Isolated (03/11/2020)   Social Connection and Isolation Panel [NHANES]    Frequency of Communication with Friends and Family: Once a week    Frequency of Social Gatherings with Friends and Family: Once a week    Attends Religious Services: Never    Database administrator or Organizations: No    Attends Engineer, structural: Never    Marital Status: Never married  Catering manager Violence: Not on file     REVIEW OF SYSTEMS:  *** [X]  denotes positive finding, [ ]  denotes negative finding Cardiac  Comments:  Chest pain or chest pressure:    Shortness of breath upon exertion:    Short of breath when lying flat:    Irregular heart rhythm:        Vascular    Pain in calf, thigh, or hip brought on by ambulation:    Pain in feet at night that wakes you up from your sleep:     Blood clot in your veins:    Leg swelling:         Pulmonary    Oxygen at home:    Productive cough:     Wheezing:         Neurologic    Sudden weakness in arms or legs:     Sudden numbness in arms or legs:     Sudden onset of difficulty speaking or slurred speech:    Temporary loss of vision in one eye:     Problems with dizziness:         Gastrointestinal    Blood in stool:     Vomited blood:         Genitourinary    Burning when urinating:     Blood in urine:         Psychiatric    Major depression:         Hematologic    Bleeding problems:    Problems with blood clotting too easily:        Skin    Rashes or ulcers:        Constitutional    Fever or chills:      PHYSICAL EXAMINATION:  ***  General:  WDWN in NAD; vital signs documented above Gait: Not observed HENT: WNL, normocephalic Pulmonary: normal non-labored breathing , without wheezing Cardiac: {Desc; regular/irreg:14544} HR, {With/Without:20273} carotid bruit*** Abdomen: soft, NT; aortic pulse is *** palpable Skin: {With/Without:20273} rashes Vascular Exam/Pulses:  Right Left  Radial {Exam; arterial pulse strength 0-4:30167} {Exam; arterial pulse strength 0-4:30167}  Femoral {Exam; arterial pulse strength 0-4:30167} {Exam; arterial pulse strength 0-4:30167}  Popliteal {Exam; arterial pulse strength 0-4:30167} {Exam; arterial pulse strength 0-4:30167}  DP {Exam; arterial pulse strength 0-4:30167} {Exam; arterial pulse strength 0-4:30167}  PT {Exam; arterial pulse strength 0-4:30167} {Exam; arterial pulse strength 0-4:30167}  Peroneal *** ***   Extremities: {With/Without:20273} ischemic changes, {With/Without:20273} Gangrene , {With/Without:20273} cellulitis; {With/Without:20273} open wounds Musculoskeletal: no muscle wasting or atrophy  Neurologic: A&O X 3 Psychiatric:  The pt has {Desc; normal/abnormal:11317::"Normal"} affect.   Non-Invasive Vascular Imaging:   ABI's/TBI's on 03/30/2023: Right:  0.27/0 - Great toe pressure: 0 Left:  1.14/0.77 - Great toe pressure: 118  Arterial duplex on 03/30/2023: +-----------+--------+-----+--------+----------+---------------------------  RIGHT     PSV cm/sRatioStenosisWaveform  Comments                    +-----------+--------+-----+--------+----------+---------------------------  CFA Distal 98                   triphasic                             +-----------+--------+-----+--------+----------+---------------------------   DFA       117                  triphasic                             +-----------+--------+-----+--------+----------+---------------------------  SFA Prox                occluded                                      +-----------+--------+-----+--------+----------+---------------------------  SFA Mid                 occluded                                      +-----------+--------+-----+--------+----------+---------------------------  SFA Distal              occluded                                      +-----------+--------+-----+--------+----------+---------------------------  POP Prox                occluded                                       +-----------+--------+-----+--------+----------+---------------------------  POP Distal 14                             reconsitution at distal stent end           +-----------+--------+-----+--------+----------+---------------------------  TP Trunk   20                   monophasic                            +-----------+--------+-----+--------+----------+---------------------------   ATA Distal 7                    monophasic                            +-----------+--------+-----+--------+----------+---------------------------   PTA Distal 14                   monophasic                            +-----------+--------+-----+--------+----------+---------------------------  PERO Distal6                    monophasic                            +-----------+--------+-----+--------+----------+---------------------------     Summary:  Right:  Flush occlusion of the SFA to the mid popliteal, including the stent. Reconstitution at the distal end of the stent in the popliteal artery.   Previous ABI's/TBI's on 12/06/2022: Right:  0.47/0  Left:  1.02/0      ASSESSMENT/PLAN:: 67 y.o. male here for follow up for PAD with hx of angiogram with laser atherectomy and stent of the right SFA and  popliteal arteries on 01/15/2023 by Dr. Randie Heinz for lifestyle limiting RLE claudication.  He was encouraged on urgent smoking cessation and was placed on Plavix along with Xarelto and Crestor for medical management.   -*** -continue *** -pt will f/u in *** with ***.   Doreatha Massed, [facility] [phone]  Clinic MD:   Randie Heinz

## 2023-04-17 ENCOUNTER — Inpatient Hospital Stay: Admission: RE | Admit: 2023-04-17 | Payer: Medicare PPO | Source: Ambulatory Visit

## 2023-04-19 ENCOUNTER — Inpatient Hospital Stay: Payer: No Typology Code available for payment source | Attending: Physician Assistant

## 2023-04-19 ENCOUNTER — Ambulatory Visit (HOSPITAL_COMMUNITY): Admission: RE | Admit: 2023-04-19 | Payer: Medicare HMO | Source: Ambulatory Visit

## 2023-04-21 ENCOUNTER — Telehealth: Payer: Self-pay | Admitting: Internal Medicine

## 2023-04-23 ENCOUNTER — Telehealth: Payer: Self-pay | Admitting: *Deleted

## 2023-04-23 ENCOUNTER — Ambulatory Visit: Payer: Medicare PPO | Admitting: Physician Assistant

## 2023-04-23 NOTE — Telephone Encounter (Signed)
Received notification from [place] when trying to obtain PA for MRI for Brain.  The request for additional Hem/Onc care with [facility] was declined. Hem/Onc care is being brought back to [facility]. Veteran will be scheduled with the [facility] Oncology group for office visits and imaging. If Randy Andersen wishes to continue seeing Randy Andersen he will need to use private insurance or self pay.   [facility] and [facility].  Was advised to give this patient the following number to get scheduled with the [facility] based on his community care provider.  [phone] for Randy Andersen.  Spoke with patient and gave this to the patient.  He will call back to advise if he was able to get scheduled with them.  And we will cancel at that time his follow ups if appropriate.

## 2023-04-24 ENCOUNTER — Other Ambulatory Visit: Payer: Medicare HMO

## 2023-04-24 ENCOUNTER — Inpatient Hospital Stay: Payer: No Typology Code available for payment source | Admitting: Internal Medicine

## 2023-04-24 NOTE — Progress Notes (Deleted)
CL 106 01/15/2023 0824   CO2 26 12/19/2022 1057   BUN 17 01/15/2023 0824   CREATININE 1.70 (H) 01/15/2023 0824   CREATININE 1.73 (H) 12/19/2022 1057      Component Value Date/Time   CALCIUM 9.3 12/19/2022 1057   ALKPHOS 102 12/19/2022 1057   AST 12 (L) 12/19/2022 1057   ALT 16 12/19/2022 1057   BILITOT 0.6 12/19/2022 1057       RADIOGRAPHIC STUDIES:  VAS [place] LOWER EXTREMITY ARTERIAL DUPLEX  Result Date: 03/30/2023 LOWER EXTREMITY ARTERIAL DUPLEX STUDY Patient Name:  Randy Andersen  Date of Exam:   03/30/2023 Medical Rec #: [id number]    Accession #:    [id number] Date of Birth: 1955-10-18    Patient Gender: M Patient Age:   67 years Exam Location:  [facility] Procedure:      VAS [place] LOWER EXTREMITY ARTERIAL DUPLEX Referring Phys: Lemar Livings --------------------------------------------------------------------------------   Vascular                01/15/23: Laser arthrectomy right SFA and popliteal Interventions:         arteries                        Stent right SFA and popliteal arteries. Current ABI:           Right 0.27/0 Left 1.02/0.77 Performing Technologist: Thereasa Parkin RVT  Examination Guidelines: A complete evaluation includes B-mode imaging, spectral Doppler, color Doppler, and power Doppler as needed of all accessible portions of each vessel. Bilateral testing is considered an integral part of a complete examination. Limited examinations for reoccurring indications may be performed as noted.  +-----------+--------+-----+--------+----------+------------------------------+ RIGHT      PSV cm/sRatioStenosisWaveform  Comments                       +-----------+--------+-----+--------+----------+------------------------------+ CFA Distal 98                   triphasic                                +-----------+--------+-----+--------+----------+------------------------------+ DFA        117                  triphasic                                +-----------+--------+-----+--------+----------+------------------------------+ SFA Prox                occluded                                         +-----------+--------+-----+--------+----------+------------------------------+ SFA Mid                 occluded                                         +-----------+--------+-----+--------+----------+------------------------------+ SFA Distal              occluded                                         +-----------+--------+-----+--------+----------+------------------------------+  POP Prox                occluded                                         +-----------+--------+-----+--------+----------+------------------------------+ POP Distal 14                             reconsitution at distal stent                                            end                             +-----------+--------+-----+--------+----------+------------------------------+ TP Trunk   20                   monophasic                               +-----------+--------+-----+--------+----------+------------------------------+ ATA Distal 7                    monophasic                               +-----------+--------+-----+--------+----------+------------------------------+ PTA Distal 14                   monophasic                               +-----------+--------+-----+--------+----------+------------------------------+ PERO Distal6                    monophasic                               +-----------+--------+-----+--------+----------+------------------------------+  Summary: Right: Flush occlusion of the SFA to the mid popliteal, including the stent. Reconstitution at the distal end of the stent in the popliteal artery.  See table(s) above for measurements and observations. Patient is scheduled with a PA on 04/04/23.Electronically signed by Gerarda Fraction on 03/30/2023 at 3:33:02 PM.    Final    VAS [place] ABI WITH/WO TBI  Result Date: 03/30/2023  LOWER EXTREMITY DOPPLER STUDY Patient Name:  Randy Andersen  Date of Exam:   03/30/2023 Medical Rec #: [id number]    Accession #:    [id number] Date of Birth: 1956-01-20    Patient Gender: M Patient Age:   17 years Exam Location:  [facility] Procedure:      VAS [place] ABI WITH/WO TBI Referring Phys: Lemar Livings --------------------------------------------------------------------------------  Indications: Peripheral artery disease. High Risk Factors: Current smoker.  Vascular               01/15/23: Laser arthrectomy right SFA and popliteal Interventions:         arteries                        Stent right SFA and popliteal arteries. Performing Technologist: Thereasa Parkin RVT  Examination Guidelines: A  CL 106 01/15/2023 0824   CO2 26 12/19/2022 1057   BUN 17 01/15/2023 0824   CREATININE 1.70 (H) 01/15/2023 0824   CREATININE 1.73 (H) 12/19/2022 1057      Component Value Date/Time   CALCIUM 9.3 12/19/2022 1057   ALKPHOS 102 12/19/2022 1057   AST 12 (L) 12/19/2022 1057   ALT 16 12/19/2022 1057   BILITOT 0.6 12/19/2022 1057       RADIOGRAPHIC STUDIES:  VAS [place] LOWER EXTREMITY ARTERIAL DUPLEX  Result Date: 03/30/2023 LOWER EXTREMITY ARTERIAL DUPLEX STUDY Patient Name:  Randy Andersen  Date of Exam:   03/30/2023 Medical Rec #: [id number]    Accession #:    [id number] Date of Birth: 1955-10-18    Patient Gender: M Patient Age:   67 years Exam Location:  [facility] Procedure:      VAS [place] LOWER EXTREMITY ARTERIAL DUPLEX Referring Phys: Lemar Livings --------------------------------------------------------------------------------   Vascular                01/15/23: Laser arthrectomy right SFA and popliteal Interventions:         arteries                        Stent right SFA and popliteal arteries. Current ABI:           Right 0.27/0 Left 1.02/0.77 Performing Technologist: Thereasa Parkin RVT  Examination Guidelines: A complete evaluation includes B-mode imaging, spectral Doppler, color Doppler, and power Doppler as needed of all accessible portions of each vessel. Bilateral testing is considered an integral part of a complete examination. Limited examinations for reoccurring indications may be performed as noted.  +-----------+--------+-----+--------+----------+------------------------------+ RIGHT      PSV cm/sRatioStenosisWaveform  Comments                       +-----------+--------+-----+--------+----------+------------------------------+ CFA Distal 98                   triphasic                                +-----------+--------+-----+--------+----------+------------------------------+ DFA        117                  triphasic                                +-----------+--------+-----+--------+----------+------------------------------+ SFA Prox                occluded                                         +-----------+--------+-----+--------+----------+------------------------------+ SFA Mid                 occluded                                         +-----------+--------+-----+--------+----------+------------------------------+ SFA Distal              occluded                                         +-----------+--------+-----+--------+----------+------------------------------+  CL 106 01/15/2023 0824   CO2 26 12/19/2022 1057   BUN 17 01/15/2023 0824   CREATININE 1.70 (H) 01/15/2023 0824   CREATININE 1.73 (H) 12/19/2022 1057      Component Value Date/Time   CALCIUM 9.3 12/19/2022 1057   ALKPHOS 102 12/19/2022 1057   AST 12 (L) 12/19/2022 1057   ALT 16 12/19/2022 1057   BILITOT 0.6 12/19/2022 1057       RADIOGRAPHIC STUDIES:  VAS [place] LOWER EXTREMITY ARTERIAL DUPLEX  Result Date: 03/30/2023 LOWER EXTREMITY ARTERIAL DUPLEX STUDY Patient Name:  Randy Andersen  Date of Exam:   03/30/2023 Medical Rec #: [id number]    Accession #:    [id number] Date of Birth: 1955-10-18    Patient Gender: M Patient Age:   67 years Exam Location:  [facility] Procedure:      VAS [place] LOWER EXTREMITY ARTERIAL DUPLEX Referring Phys: Lemar Livings --------------------------------------------------------------------------------   Vascular                01/15/23: Laser arthrectomy right SFA and popliteal Interventions:         arteries                        Stent right SFA and popliteal arteries. Current ABI:           Right 0.27/0 Left 1.02/0.77 Performing Technologist: Thereasa Parkin RVT  Examination Guidelines: A complete evaluation includes B-mode imaging, spectral Doppler, color Doppler, and power Doppler as needed of all accessible portions of each vessel. Bilateral testing is considered an integral part of a complete examination. Limited examinations for reoccurring indications may be performed as noted.  +-----------+--------+-----+--------+----------+------------------------------+ RIGHT      PSV cm/sRatioStenosisWaveform  Comments                       +-----------+--------+-----+--------+----------+------------------------------+ CFA Distal 98                   triphasic                                +-----------+--------+-----+--------+----------+------------------------------+ DFA        117                  triphasic                                +-----------+--------+-----+--------+----------+------------------------------+ SFA Prox                occluded                                         +-----------+--------+-----+--------+----------+------------------------------+ SFA Mid                 occluded                                         +-----------+--------+-----+--------+----------+------------------------------+ SFA Distal              occluded                                         +-----------+--------+-----+--------+----------+------------------------------+  CL 106 01/15/2023 0824   CO2 26 12/19/2022 1057   BUN 17 01/15/2023 0824   CREATININE 1.70 (H) 01/15/2023 0824   CREATININE 1.73 (H) 12/19/2022 1057      Component Value Date/Time   CALCIUM 9.3 12/19/2022 1057   ALKPHOS 102 12/19/2022 1057   AST 12 (L) 12/19/2022 1057   ALT 16 12/19/2022 1057   BILITOT 0.6 12/19/2022 1057       RADIOGRAPHIC STUDIES:  VAS [place] LOWER EXTREMITY ARTERIAL DUPLEX  Result Date: 03/30/2023 LOWER EXTREMITY ARTERIAL DUPLEX STUDY Patient Name:  Randy Andersen  Date of Exam:   03/30/2023 Medical Rec #: [id number]    Accession #:    [id number] Date of Birth: 1955-10-18    Patient Gender: M Patient Age:   67 years Exam Location:  [facility] Procedure:      VAS [place] LOWER EXTREMITY ARTERIAL DUPLEX Referring Phys: Lemar Livings --------------------------------------------------------------------------------   Vascular                01/15/23: Laser arthrectomy right SFA and popliteal Interventions:         arteries                        Stent right SFA and popliteal arteries. Current ABI:           Right 0.27/0 Left 1.02/0.77 Performing Technologist: Thereasa Parkin RVT  Examination Guidelines: A complete evaluation includes B-mode imaging, spectral Doppler, color Doppler, and power Doppler as needed of all accessible portions of each vessel. Bilateral testing is considered an integral part of a complete examination. Limited examinations for reoccurring indications may be performed as noted.  +-----------+--------+-----+--------+----------+------------------------------+ RIGHT      PSV cm/sRatioStenosisWaveform  Comments                       +-----------+--------+-----+--------+----------+------------------------------+ CFA Distal 98                   triphasic                                +-----------+--------+-----+--------+----------+------------------------------+ DFA        117                  triphasic                                +-----------+--------+-----+--------+----------+------------------------------+ SFA Prox                occluded                                         +-----------+--------+-----+--------+----------+------------------------------+ SFA Mid                 occluded                                         +-----------+--------+-----+--------+----------+------------------------------+ SFA Distal              occluded                                         +-----------+--------+-----+--------+----------+------------------------------+  CL 106 01/15/2023 0824   CO2 26 12/19/2022 1057   BUN 17 01/15/2023 0824   CREATININE 1.70 (H) 01/15/2023 0824   CREATININE 1.73 (H) 12/19/2022 1057      Component Value Date/Time   CALCIUM 9.3 12/19/2022 1057   ALKPHOS 102 12/19/2022 1057   AST 12 (L) 12/19/2022 1057   ALT 16 12/19/2022 1057   BILITOT 0.6 12/19/2022 1057       RADIOGRAPHIC STUDIES:  VAS [place] LOWER EXTREMITY ARTERIAL DUPLEX  Result Date: 03/30/2023 LOWER EXTREMITY ARTERIAL DUPLEX STUDY Patient Name:  Randy Andersen  Date of Exam:   03/30/2023 Medical Rec #: [id number]    Accession #:    [id number] Date of Birth: 1955-10-18    Patient Gender: M Patient Age:   67 years Exam Location:  [facility] Procedure:      VAS [place] LOWER EXTREMITY ARTERIAL DUPLEX Referring Phys: Lemar Livings --------------------------------------------------------------------------------   Vascular                01/15/23: Laser arthrectomy right SFA and popliteal Interventions:         arteries                        Stent right SFA and popliteal arteries. Current ABI:           Right 0.27/0 Left 1.02/0.77 Performing Technologist: Thereasa Parkin RVT  Examination Guidelines: A complete evaluation includes B-mode imaging, spectral Doppler, color Doppler, and power Doppler as needed of all accessible portions of each vessel. Bilateral testing is considered an integral part of a complete examination. Limited examinations for reoccurring indications may be performed as noted.  +-----------+--------+-----+--------+----------+------------------------------+ RIGHT      PSV cm/sRatioStenosisWaveform  Comments                       +-----------+--------+-----+--------+----------+------------------------------+ CFA Distal 98                   triphasic                                +-----------+--------+-----+--------+----------+------------------------------+ DFA        117                  triphasic                                +-----------+--------+-----+--------+----------+------------------------------+ SFA Prox                occluded                                         +-----------+--------+-----+--------+----------+------------------------------+ SFA Mid                 occluded                                         +-----------+--------+-----+--------+----------+------------------------------+ SFA Distal              occluded                                         +-----------+--------+-----+--------+----------+------------------------------+

## 2023-04-24 NOTE — Progress Notes (Signed)
Progress notes, imaging reports, and pathology report faxed to Sue Lush, navigator at [facility] at [phone]

## 2023-04-25 ENCOUNTER — Encounter: Payer: Self-pay | Admitting: Physician Assistant

## 2023-04-25 ENCOUNTER — Ambulatory Visit (INDEPENDENT_AMBULATORY_CARE_PROVIDER_SITE_OTHER): Payer: Medicare HMO | Admitting: Physician Assistant

## 2023-04-25 VITALS — BP 170/97 | HR 62 | Temp 97.4°F | Resp 18 | Ht 73.0 in | Wt 180.8 lb

## 2023-04-25 DIAGNOSIS — I70221 Atherosclerosis of native arteries of extremities with rest pain, right leg: Secondary | ICD-10-CM | POA: Diagnosis not present

## 2023-04-25 DIAGNOSIS — I739 Peripheral vascular disease, unspecified: Secondary | ICD-10-CM

## 2023-04-25 NOTE — Progress Notes (Signed)
Office Note     CC:  follow up Requesting Provider:  Sondra Come, MD  HPI: Randy Andersen is a 67 y.o. ([birth year]) male who presents for routine follow up of PAD. We have been following him for right lower extremity claudication symptoms. At his last visit in May his symptoms had become lifestyle limiting and therefore on 01/15/23 he underwent Aortogram, BLE arteriogram with laser atherectomy of his right SFA and popliteal artery as well as right SFA and popliteal artery stenting by Dr. Randie Heinz.   Today he says he has had to miss and cancel several of his appointments but he does not explain why. He says however that for past month or more his right leg has been very painful to walk but more so waking him up from sleep almost every night. He says his foot stays numb. He feels this is worse than it was prior to his intervention. He explains that he can walk about 1 block before he has to stop and rest. Pain is improved with rest. He also complains of his feet staying cold. He denies any pain in left leg on ambulation or rest. He has no tissue loss. He is not a very good historian and is unable to tell me which medications his is on. He is suppose to be medically managed on Plavix, Xarelto and Statin. He says he may be out. He continues to smoke 1/2 ppd.   Past Medical History:  Diagnosis Date   Asthma    as a child   Chronic pain disorder    LT leg and foot   Heart murmur    as a child   Hypertension    Malignant neoplasm of upper lobe of right lung (HCC)    Stroke (HCC)    mini stroke - 2015, some tingling in fingers on right     Past Surgical History:  Procedure Laterality Date   ABDOMINAL AORTOGRAM W/LOWER EXTREMITY N/A 01/15/2023   Procedure: ABDOMINAL AORTOGRAM W/LOWER EXTREMITY;  Surgeon: Maeola Harman, MD;  Location: [facility] INVASIVE CV LAB;  Service: Cardiovascular;  Laterality: N/A;   BUNIONECTOMY Left    had hammer toe surgery also and callus removed   BUNIONECTOMY Right     also had hammer toe surgery and callus removed   LEG SURGERY Left    PT reports he has pins placed in his Lt leg   ORIF TIBIA PLATEAU  10/09/2012   Dr Ophelia Charter   ORIF TIBIA PLATEAU Left 10/08/2012   Procedure: OPEN REDUCTION INTERNAL FIXATION (ORIF) TIBIAL PLATEAU;  Surgeon: Eldred Manges, MD;  Location: [facility] OR;  Service: Orthopedics;  Laterality: Left;   PERIPHERAL VASCULAR ATHERECTOMY  01/15/2023   Procedure: PERIPHERAL VASCULAR ATHERECTOMY;  Surgeon: Maeola Harman, MD;  Location: [facility] INVASIVE CV LAB;  Service: Cardiovascular;;   PERIPHERAL VASCULAR INTERVENTION  01/15/2023   Procedure: PERIPHERAL VASCULAR INTERVENTION;  Surgeon: Maeola Harman, MD;  Location: [facility] INVASIVE CV LAB;  Service: Cardiovascular;;   VIDEO BRONCHOSCOPY WITH ENDOBRONCHIAL NAVIGATION N/A 02/25/2020   Procedure: VIDEO BRONCHOSCOPY WITH ENDOBRONCHIAL NAVIGATION with biopsies;  Surgeon: Leslye Peer, MD;  Location: [facility] OR;  Service: Thoracic;  Laterality: N/A;   VIDEO BRONCHOSCOPY WITH ENDOBRONCHIAL ULTRASOUND N/A 02/25/2020   Procedure: VIDEO BRONCHOSCOPY WITH ENDOBRONCHIAL ULTRASOUND;  Surgeon: Leslye Peer, MD;  Location: [facility] OR;  Service: Thoracic;  Laterality: N/A;    Social History   Socioeconomic History   Marital status: Single    Spouse name: Not on file  Number of children: Not on file   Years of education: Not on file   Highest education level: Not on file  Occupational History   Not on file  Tobacco Use   Smoking status: Every Day    Current packs/day: 0.50    Average packs/day: 0.5 packs/day for 28.0 years (14.0 ttl pk-yrs)    Types: Cigarettes   Smokeless tobacco: Never  Vaping Use   Vaping status: Never Used  Substance and Sexual Activity   Alcohol use: No   Drug use: No   Sexual activity: Not on file  Other Topics Concern   Not on file  Social History Narrative   Not on file   Social Determinants of Health   Financial Resource Strain: Medium Risk (03/11/2020)    Overall Financial Resource Strain (CARDIA)    Difficulty of Paying Living Expenses: Somewhat hard  Food Insecurity: No Food Insecurity (03/11/2020)   Hunger Vital Sign    Worried About Running Out of Food in the Last Year: Never true    Ran Out of Food in the Last Year: Never true  Transportation Needs: Unmet Transportation Needs (02/10/2022)   PRAPARE - Transportation    Lack of Transportation (Medical): Yes    Lack of Transportation (Non-Medical): Yes  Physical Activity: Insufficiently Active (03/11/2020)   Exercise Vital Sign    Days of Exercise per Week: 3 days    Minutes of Exercise per Session: 20 min  Stress: Not on file  Social Connections: Socially Isolated (03/11/2020)   Social Connection and Isolation Panel [NHANES]    Frequency of Communication with Friends and Family: Once a week    Frequency of Social Gatherings with Friends and Family: Once a week    Attends Religious Services: Never    Database administrator or Organizations: No    Attends Engineer, structural: Never    Marital Status: Never married  Catering manager Violence: Not on file    Family History  Problem Relation Age of Onset   Cancer Mother        Intestinal metastatic to ovaries    Current Outpatient Medications  Medication Sig Dispense Refill   amLODipine (NORVASC) 10 MG tablet Take 10 mg by mouth daily.     Cholecalciferol 100 MCG (4000 UT) TABS Take 2 tablets by mouth daily.     clopidogrel (PLAVIX) 75 MG tablet Take 1 tablet (75 mg total) by mouth daily. 30 tablet 11   dexamethasone (DECADRON) 1 MG tablet Take 1 tablet (1 mg total) by mouth daily. 60 tablet 1   diclofenac Sodium (VOLTAREN) 1 % GEL Apply 4 g topically 3 (three) times daily.     doxylamine, Sleep, (UNISOM) 25 MG tablet Take 1 tablet (25 mg total) by mouth at bedtime as needed for sleep. 30 tablet 0   HYDROcodone-acetaminophen (NORCO) 10-325 MG tablet Take 1 tablet by mouth 2 (two) times daily.     lidocaine (LIDODERM) 5 %  Place 1 patch onto the skin daily. Remove & Discard patch within 12 hours or as directed by MD 15 patch 0   nicotine (NICODERM CQ) 21 mg/24hr patch Place 1 patch (21 mg total) onto the skin daily. 28 patch 2   nicotine polacrilex (COMMIT) 2 MG lozenge Take 1 lozenge (2 mg total) by mouth as needed for smoking cessation. 100 tablet 0   oxybutynin (DITROPAN-XL) 10 MG 24 hr tablet Take by mouth.     oxyCODONE-acetaminophen (PERCOCET/ROXICET) 5-325 MG tablet Take 1 tablet by  mouth every 6 (six) hours as needed for severe pain. 12 tablet 0   polyethylene glycol powder (GLYCOLAX/MIRALAX) 17 GM/SCOOP powder      potassium chloride SA (KLOR-CON M) 20 MEQ tablet Take 1 tablet (20 mEq total) by mouth daily. 5 tablet 0   prochlorperazine (COMPAZINE) 10 MG tablet Take 1 tablet (10 mg total) by mouth every 6 (six) hours as needed. 30 tablet 2   rivaroxaban (XARELTO) 20 MG TABS tablet TAKE 1 TABLET BY MOUTH ONCE A DAY WITH SUPPER 30 tablet 2   rosuvastatin (CRESTOR) 10 MG tablet Take 1 tablet (10 mg total) by mouth daily. 30 tablet 11   senna-docusate (SENOKOT-S) 8.6-50 MG tablet Take 2 tablets by mouth daily.     tadalafil (CIALIS) 20 MG tablet Take by mouth.     tamsulosin (FLOMAX) 0.4 MG CAPS capsule Take 1 capsule (0.4 mg total) by mouth daily. 30 capsule 2   No current facility-administered medications for this visit.    No Known Allergies   REVIEW OF SYSTEMS:  [X]  denotes positive finding, [ ]  denotes negative finding Cardiac  Comments:  Chest pain or chest pressure:    Shortness of breath upon exertion:    Short of breath when lying flat:    Irregular heart rhythm:        Vascular    Pain in calf, thigh, or hip brought on by ambulation:    Pain in feet at night that wakes you up from your sleep:     Blood clot in your veins:    Leg swelling:         Pulmonary    Oxygen at home:    Productive cough:     Wheezing:         Neurologic    Sudden weakness in arms or legs:     Sudden  numbness in arms or legs:     Sudden onset of difficulty speaking or slurred speech:    Temporary loss of vision in one eye:     Problems with dizziness:         Gastrointestinal    Blood in stool:     Vomited blood:         Genitourinary    Burning when urinating:     Blood in urine:        Psychiatric    Major depression:         Hematologic    Bleeding problems:    Problems with blood clotting too easily:        Skin    Rashes or ulcers:        Constitutional    Fever or chills:      PHYSICAL EXAMINATION:  There were no vitals filed for this visit.  General:  WDWN in NAD; vital signs documented above Gait: Normal HENT: WNL, normocephalic Pulmonary: normal non-labored breathing , without wheezing Cardiac: regular HR Abdomen: soft Vascular Exam/Pulses: 2+ femoral pulses bilaterally, left PT pulse palpable. No distal pulses on Right lower extremity. Both feet warm. Motor and sensation intact Extremities: without ischemic changes, without Gangrene , without cellulitis; without open wounds;  Musculoskeletal: no muscle wasting or atrophy  Neurologic: A&O X 3 Psychiatric:  The pt has Normal affect.   Non-Invasive Vascular Imaging:    +-------+-----------+-----------+------------+------------+  ABI/TBIToday's ABIToday's TBIPrevious ABIPrevious TBI  +-------+-----------+-----------+------------+------------+  Right 0.27       0          0.47  0             +-------+-----------+-----------+------------+------------+  Left  1.02       0.77       1.02        0             +-------+-----------+-----------+------------+------------+   VAS [place] Lower extremity Arterial Duplex: +-----------+--------+-----+--------+----------+---------------------------  ---+  RIGHT     PSV cm/sRatioStenosisWaveform  Comments                         +-----------+--------+-----+--------+----------+---------------------------  ---+  CFA Distal 98                    triphasic                                  +-----------+--------+-----+--------+----------+---------------------------  ---+  DFA       117                  triphasic                                  +-----------+--------+-----+--------+----------+---------------------------  ---+  SFA Prox                occluded                                           +-----------+--------+-----+--------+----------+---------------------------  ---+  SFA Mid                 occluded                                           +-----------+--------+-----+--------+----------+---------------------------  ---+  SFA Distal              occluded                                           +-----------+--------+-----+--------+----------+---------------------------  ---+  POP Prox                occluded                                           +-----------+--------+-----+--------+----------+---------------------------  ---+  POP Distal 14                             reconsitution at distal  stent                                             end                              +-----------+--------+-----+--------+----------+---------------------------  ---+  TP Trunk   20  monophasic                                 +-----------+--------+-----+--------+----------+---------------------------  ---+  ATA Distal 7                    monophasic                                 +-----------+--------+-----+--------+----------+---------------------------  ---+  PTA Distal 14                   monophasic                                 +-----------+--------+-----+--------+----------+---------------------------  ---+  PERO Distal6                    monophasic                                 +-----------+--------+-----+--------+----------+---------------------------  ---+     Summary:  Right: Flush  occlusion of the SFA to the mid popliteal, including the stent. Reconstitution at the distal end of the stent in the popliteal artery.      ASSESSMENT/PLAN:: 67 y.o. male here for follow up for PAD. He is s/p Aortogram, BLE arteriogram with laser atherectomy of his right SFA and popliteal artery as well as right SFA and popliteal artery stenting by Dr. Randie Heinz on 01/15/23. He has not followed up since. He now is having recurrent claudication but also more so rest pain. He does not have any tissue loss. His duplex today shows occlusion of his right SFA though prior stent to level of distal stent in popliteal artery. His ABIs have decreased significantly on the RLE.  - he is suppose to be on Xarelto and Plavix as well as statin, however he is not sure what he is taking. Provided him with a medication list and highlighted these medications for him so that he can check at home - discussed the importance of walking regimen as well as smoking cessation - Will get him scheduled for Aortogram, Arteriogram of right lower extremity with intervention on right SFA and popliteal arteries. I will arrange this with Dr. Randie Heinz in the near future   Graceann Congress, [facility] [phone]  Clinic MD:   Randie Heinz

## 2023-04-26 ENCOUNTER — Encounter: Payer: Self-pay | Admitting: Radiation Therapy

## 2023-04-26 ENCOUNTER — Telehealth: Payer: Self-pay | Admitting: *Deleted

## 2023-04-26 ENCOUNTER — Telehealth: Payer: Self-pay | Admitting: Physician Assistant

## 2023-04-26 NOTE — Telephone Encounter (Signed)
PC to patient, asked him [facility].  He says he has not called them, he wishes he could continue to come here.  Verified with patient that visits here would be self pay.  He states he will contact the [place] - phone number from previous RN note given to patient.  Instructed patient to inform us when he has a [facility] appointment so that we know he has continuity of care.  He verbalizes understanding & states he will call back with that information.

## 2023-04-26 NOTE — Progress Notes (Signed)
The request for additional Hem/Onc care with [facility] was declined.  Hem/Onc care is being brought back to [facility]. Veteran will be scheduled with the [facility] Oncology group for office visits and imaging.  If Mr. Millea wishes to continue seeing Dr. Barbaraann Cao he will need to use private insurance or self pay.    Per the patient's insurance, Mr. Harrower will be [facility].   Jalene Mullet R.T.(R)(T) Radiation Special Procedures Navigator

## 2023-04-26 NOTE — Progress Notes (Signed)
I spoke to Orin NN, Sue Lush, to verify if the pt plans on using the [place] for his oncology care or wants to use his private insurance to stay at [facility]. It was unclear from his [place] consult if the pt made a decision at this time. C.Heilingoetter, Onc PA via [place]. Pt has an appt on 10/7 and needs a CT prior. Will reach out to the pt to assess.

## 2023-04-26 NOTE — Telephone Encounter (Signed)
I received notification that his [place] insurance is no longer covering his appointments here. He would like to stay here. He met with an oncologist yesterday at the [facility] who is arranging CT scan. He was scheduled to see me and Dr. Arbutus Ped on Monday. I will cancel his appointment for Monday. He is welcome to see [place] but it would be self pay and he has some financial constraints. If he is able to get approval to continue to follow with our clinic, we would be happy to see him back and reschedule him. I gave him our number. Also talked to navigation who is going to talk to RN navigator at [place].

## 2023-04-27 ENCOUNTER — Encounter (HOSPITAL_COMMUNITY): Payer: Self-pay | Admitting: Internal Medicine

## 2023-04-27 ENCOUNTER — Other Ambulatory Visit: Payer: Self-pay | Admitting: Internal Medicine

## 2023-04-27 DIAGNOSIS — C3492 Malignant neoplasm of unspecified part of left bronchus or lung: Secondary | ICD-10-CM

## 2023-04-30 ENCOUNTER — Inpatient Hospital Stay: Payer: No Typology Code available for payment source | Admitting: Physician Assistant

## 2023-04-30 ENCOUNTER — Inpatient Hospital Stay: Payer: No Typology Code available for payment source | Admitting: Internal Medicine

## 2023-05-01 ENCOUNTER — Other Ambulatory Visit: Payer: Self-pay

## 2023-05-01 DIAGNOSIS — I739 Peripheral vascular disease, unspecified: Secondary | ICD-10-CM

## 2023-05-01 DIAGNOSIS — I70221 Atherosclerosis of native arteries of extremities with rest pain, right leg: Secondary | ICD-10-CM

## 2023-05-02 ENCOUNTER — Telehealth: Payer: Self-pay

## 2023-05-02 NOTE — Telephone Encounter (Signed)
Received VM from patient requesting to move aortogram from 10/14 to the following week on 10/21. Attempted to reach patient back to discuss and provide updated instructions. Left VM for patient to return call.

## 2023-05-04 NOTE — Telephone Encounter (Addendum)
Patient returned call. Procedure rescheduled to 10/21. Instructions provided, to include new hold date for Xarelto after 10/18. He voiced understanding.

## 2023-05-07 ENCOUNTER — Ambulatory Visit (HOSPITAL_COMMUNITY): Admission: RE | Admit: 2023-05-07 | Payer: No Typology Code available for payment source | Source: Ambulatory Visit

## 2023-05-07 ENCOUNTER — Encounter (HOSPITAL_COMMUNITY): Payer: Self-pay

## 2023-05-07 ENCOUNTER — Ambulatory Visit (HOSPITAL_COMMUNITY): Payer: Medicare HMO

## 2023-05-14 ENCOUNTER — Ambulatory Visit (HOSPITAL_COMMUNITY): Admission: RE | Admit: 2023-05-14 | Payer: Medicare HMO | Source: Home / Self Care | Admitting: Vascular Surgery

## 2023-05-14 ENCOUNTER — Encounter (HOSPITAL_COMMUNITY): Admission: RE | Payer: Self-pay | Source: Home / Self Care

## 2023-05-14 SURGERY — ABDOMINAL AORTOGRAM W/LOWER EXTREMITY
Anesthesia: LOCAL

## 2023-08-31 ENCOUNTER — Other Ambulatory Visit (HOSPITAL_COMMUNITY): Payer: Self-pay | Admitting: Internal Medicine

## 2023-08-31 ENCOUNTER — Other Ambulatory Visit: Payer: Self-pay | Admitting: Internal Medicine

## 2023-08-31 DIAGNOSIS — C3490 Malignant neoplasm of unspecified part of unspecified bronchus or lung: Secondary | ICD-10-CM

## 2023-08-31 DIAGNOSIS — Z0189 Encounter for other specified special examinations: Secondary | ICD-10-CM

## 2023-09-03 ENCOUNTER — Telehealth: Payer: Self-pay | Admitting: Radiation Oncology

## 2023-09-03 NOTE — Telephone Encounter (Signed)
 2/10 sent via stat fax request for MRI images (01/31/23) and/or recent PET images to be pushed to powershare from [facility] Radiology dept.  Waiting on images.

## 2023-09-04 ENCOUNTER — Telehealth: Payer: Self-pay | Admitting: Radiation Oncology

## 2023-09-04 NOTE — Telephone Encounter (Signed)
2/11 Follow up call to [facility] to have recent MRI images to be pushed to powershare.  No recent images done last year.  Follow up call to Dr. Osker Mason office [facility]).  Waiting on call back from [place] nurse navigator to confirm if any recent MRI images done.

## 2023-09-04 NOTE — Telephone Encounter (Signed)
2/11 @ 9:20 am Left voicemail on both patient's contact numbers for patient to call our office to be schedule for an follow up consult.

## 2023-09-11 ENCOUNTER — Other Ambulatory Visit: Payer: Self-pay | Admitting: Internal Medicine

## 2023-09-11 ENCOUNTER — Ambulatory Visit (HOSPITAL_COMMUNITY)
Admission: RE | Admit: 2023-09-11 | Discharge: 2023-09-11 | Disposition: A | Discharge status: Home / Self Care | Payer: No Typology Code available for payment source | Source: Ambulatory Visit | Attending: Internal Medicine | Admitting: Internal Medicine

## 2023-09-11 DIAGNOSIS — Z0189 Encounter for other specified special examinations: Secondary | ICD-10-CM

## 2023-09-19 ENCOUNTER — Ambulatory Visit (HOSPITAL_COMMUNITY)
Admission: RE | Admit: 2023-09-19 | Discharge: 2023-09-19 | Disposition: A | Discharge status: Home / Self Care | Payer: No Typology Code available for payment source | Source: Ambulatory Visit | Attending: Internal Medicine | Admitting: Internal Medicine

## 2023-09-19 DIAGNOSIS — C3490 Malignant neoplasm of unspecified part of unspecified bronchus or lung: Secondary | ICD-10-CM | POA: Diagnosis present

## 2023-09-19 DIAGNOSIS — Z0189 Encounter for other specified special examinations: Secondary | ICD-10-CM | POA: Insufficient documentation

## 2023-09-19 DIAGNOSIS — C7931 Secondary malignant neoplasm of brain: Secondary | ICD-10-CM | POA: Insufficient documentation

## 2023-09-24 NOTE — Progress Notes (Signed)
 Radiation Oncology         [phone] ________________________________  Outpatient Follow Up     Name: Randy Andersen        MRN: [id number]  Date of Service: 09/26/2023 DOB: [birth year]  CC:  Randy Nickels, MD     REFERRING PHYSICIAN: Newman Nickels, MD   DIAGNOSIS: The encounter diagnosis was Malignant neoplasm of upper lobe of right lung (HCC).   HISTORY OF PRESENT ILLNESS: Randy Andersen is a 68 y.o. 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 He received stereotactic radiosurgery to two targets in August 2021. He continued with Dr. Arbutus Ped on Carboplatin/Alimta/Keytruda until March 2022. A restaging CT scan on 02/07/21 showed interval change in the RUL measuring 4.7 x 3.2 cm (previously in April 2022 this was 2.7 x 2.1 cm). Stable subcentimeter nodules in the LUL and scattered throughout the lungs were noted. No other progressive disease was seen. His MRI brain on 02/18/21 was also negative for new disease. He proceeded with stereotactic body radiotherapy (SBRT) to achieve local control in the RUL which he completed in September 2022.   He continued with Keytruda with Dr. Arbutus Ped with his last being on 04/27/22. [facility]. His last MRI brain on 07/14/22 showed stable post treatment changes in the lesion in the left superior frontal gyrus. [facility] for follow up with [place], and an MRI on 09/19/23 showed ***.     PREVIOUS RADIATION THERAPY:    03/23/2021 through 04/06/2021  Ultrahypofractionated Radiation Site Technique Total Dose (Gy) Dose per Fx (Gy) Completed Fx Beam Energies  Lung, Right: Lung_RUL IMRT 60/60 7.5 8/8 6XFFF     03/18/20 SRS Treatment:   20 Gy in 1 fraction to 2 targets: PTV1 Rt Parietal 6 mm PTV2 Lt Parietal 6 mm   PAST MEDICAL HISTORY:  Past Medical History:  Diagnosis Date   Asthma    as a child   Chronic pain disorder    LT leg  and foot   Heart murmur    as a child   Hypertension    Malignant neoplasm of upper lobe of right lung (HCC)    Stroke (HCC)    mini stroke - 2015, some tingling in fingers on right        PAST SURGICAL HISTORY: Past Surgical History:  Procedure Laterality Date   ABDOMINAL AORTOGRAM W/LOWER EXTREMITY N/A 01/15/2023   Procedure: ABDOMINAL AORTOGRAM W/LOWER EXTREMITY;  Surgeon: Maeola Harman, MD;  Location: [facility] INVASIVE CV LAB;  Service: Cardiovascular;  Laterality: N/A;   BUNIONECTOMY Left    had hammer toe surgery also and callus removed   BUNIONECTOMY Right    also had hammer toe surgery and callus removed   LEG SURGERY Left    PT reports he has pins placed in his Lt leg   ORIF TIBIA PLATEAU  10/09/2012   Dr Ophelia Charter   ORIF TIBIA PLATEAU Left 10/08/2012   Procedure: OPEN REDUCTION INTERNAL FIXATION (ORIF) TIBIAL PLATEAU;  Surgeon: Eldred Manges, MD;  Location: [facility] OR;  Service: Orthopedics;  Laterality: Left;   PERIPHERAL VASCULAR ATHERECTOMY  01/15/2023   Procedure: PERIPHERAL VASCULAR ATHERECTOMY;  Surgeon: Maeola Harman, MD;  Location: [facility] INVASIVE CV LAB;  Service: Cardiovascular;;   PERIPHERAL VASCULAR INTERVENTION  01/15/2023   Procedure: PERIPHERAL VASCULAR INTERVENTION;  Surgeon: Maeola Harman, MD;  Location: [facility] INVASIVE CV LAB;  Service: Cardiovascular;;  VIDEO BRONCHOSCOPY WITH ENDOBRONCHIAL NAVIGATION N/A 02/25/2020   Procedure: VIDEO BRONCHOSCOPY WITH ENDOBRONCHIAL NAVIGATION with biopsies;  Surgeon: Leslye Peer, MD;  Location: [facility] OR;  Service: Thoracic;  Laterality: N/A;   VIDEO BRONCHOSCOPY WITH ENDOBRONCHIAL ULTRASOUND N/A 02/25/2020   Procedure: VIDEO BRONCHOSCOPY WITH ENDOBRONCHIAL ULTRASOUND;  Surgeon: Leslye Peer, MD;  Location: [facility] OR;  Service: Thoracic;  Laterality: N/A;     FAMILY HISTORY:  Family History  Problem Relation Age of Onset   Cancer Mother        Intestinal metastatic to ovaries     SOCIAL HISTORY:  reports  that he has been smoking cigarettes. He has a 14 pack-year smoking history. He has never used smokeless tobacco. He reports that he does not drink alcohol and does not use drugs. The patient is single and lives in [place].    ALLERGIES: Patient has no known allergies.   MEDICATIONS:  Current Outpatient Medications  Medication Sig Dispense Refill   amLODipine (NORVASC) 10 MG tablet Take 10 mg by mouth daily.     Cholecalciferol 100 MCG (4000 UT) TABS Take 2 tablets by mouth daily.     clopidogrel (PLAVIX) 75 MG tablet Take 1 tablet (75 mg total) by mouth daily. (Patient not taking: Reported on 05/01/2023) 30 tablet 11   folic acid (FOLVITE) 1 MG tablet Take 1 mg by mouth daily.     naloxone (NARCAN) nasal spray 4 mg/0.1 mL Place 0.4 mg into the nose once.     nicotine (NICODERM CQ) 21 mg/24hr patch Place 1 patch (21 mg total) onto the skin daily. (Patient not taking: Reported on 05/01/2023) 28 patch 2   oxybutynin (DITROPAN-XL) 5 MG 24 hr tablet Take 10 mg by mouth daily as needed (Bladder control).     oxyCODONE-acetaminophen (PERCOCET) 10-325 MG tablet Take 1 tablet by mouth every 6 (six) hours as needed for pain.     rivaroxaban (XARELTO) 20 MG TABS tablet TAKE 1 TABLET BY MOUTH ONCE A DAY WITH SUPPER (Patient taking differently: Take 20 mg by mouth daily with supper.) 30 tablet 2   rosuvastatin (CRESTOR) 10 MG tablet Take 1 tablet (10 mg total) by mouth daily. (Patient not taking: Reported on 05/01/2023) 30 tablet 11   tadalafil (CIALIS) 20 MG tablet Take 20 mg by mouth daily as needed for erectile dysfunction.     tamsulosin (FLOMAX) 0.4 MG CAPS capsule Take 1 capsule (0.4 mg total) by mouth daily. 30 capsule 2   No current facility-administered medications for this visit.     REVIEW OF SYSTEMS: The patient reports that overall he is doing well. He reports occasional productive cough with clear or white mucous but denies any hemoptysis or shortness of breath or chest pain. No other  complaints are noted.  PHYSICAL EXAM:  Unable to assess due to encounter type.  ECOG = 0  0 - Asymptomatic (Fully active, able to carry on all predisease activities without restriction)  1 - Symptomatic but completely ambulatory (Restricted in physically strenuous activity but ambulatory and able to carry out work of a light or sedentary nature. For example, light housework, office work)  2 - Symptomatic, <50% in bed during the day (Ambulatory and capable of all self care but unable to carry out any work activities. Up and about more than 50% of waking hours)  3 - Symptomatic, >50% in bed, but not bedbound (Capable of only limited self-care, confined to bed or chair 50% or more of waking hours)  4 -  Bedbound (Completely disabled. Cannot carry on any self-care. Totally confined to bed or chair)  5 - Death   Santiago Glad MM, Creech RH, Tormey DC, et al. [id number]). "Toxicity and response criteria of the [facility] Group". Am. Evlyn Clines. Oncol. 5 (6): 649-55    LABORATORY DATA:  Lab Results  Component Value Date   WBC 7.0 12/19/2022   HGB 15.6 01/15/2023   HCT 46.0 01/15/2023   MCV 88.8 12/19/2022   PLT 206 12/19/2022   Lab Results  Component Value Date   NA 140 01/15/2023   K 3.4 (L) 01/15/2023   CL 106 01/15/2023   CO2 26 12/19/2022   Lab Results  Component Value Date   ALT 16 12/19/2022   AST 12 (L) 12/19/2022   ALKPHOS 102 12/19/2022   BILITOT 0.6 12/19/2022      RADIOGRAPHY: CT CHEST ABDOMEN PELVIS WO CONTRAST Result Date: 09/12/2023 CLINICAL DATA:  History of metastatic lung cancer, follow-up. * Tracking Code: BO * EXAM: CT CHEST, ABDOMEN AND PELVIS WITHOUT CONTRAST TECHNIQUE: Multidetector CT imaging of the chest, abdomen and pelvis was performed following the standard protocol without IV contrast. RADIATION DOSE REDUCTION: This exam was performed according to the departmental dose-optimization program which includes automated exposure control, adjustment of  the mA and/or kV according to patient size and/or use of iterative reconstruction technique. COMPARISON:  Multiple priors including CT Dec 19, 2022 FINDINGS: CT CHEST FINDINGS Cardiovascular: Aortic atherosclerosis. Coronary artery calcifications. Normal size heart. Mediastinum/Nodes: Slight rightward deviation of the mediastinum is similar prior. No pathologically enlarged mediastinal, hilar or axillary lymph nodes. Lungs/Pleura: Debris in the trachea.  Mild centrilobular emphysema. Stable right upper lobe and superior right lower lobe postradiation change. Stable 5 mm subsolid left upper lobe pulmonary nodule on image 56/6. No new suspicious pulmonary nodules or masses. Musculoskeletal: No suspicious osseous lesion. Multilevel degenerative changes spine. CT ABDOMEN PELVIS FINDINGS Hepatobiliary: Unremarkable noncontrast enhanced appearance of the hepatic parenchyma. Gallbladder is unremarkable. No biliary ductal dilation. Pancreas: No pancreatic ductal dilation or evidence of acute inflammation. Spleen: No splenomegaly. Adrenals/Urinary Tract: Adrenal glands are unremarkable. No hydronephrosis. Right upper pole renal cysts. Urinary bladder is unremarkable for degree of distension. Bladder is unremarkable. Stomach/Bowel: Stomach is unremarkable for degree of distension. No pathologic dilation of small or large bowel. No evidence of acute bowel inflammation. Vascular/Lymphatic: Aortic atherosclerosis. Normal caliber abdominal aorta. No pathologically enlarged abdominal or pelvic lymph nodes. Reproductive: Prostate is unremarkable. Other: No significant abdominopelvic free fluid. Musculoskeletal: No aggressive lytic or blastic lesion of bone. Multilevel degenerative change of the spine. IMPRESSION: 1. Stable postradiation change in the right lung without evidence of local recurrence. No evidence of metastatic disease in the chest, abdomen or pelvis. 2. Stable 5 mm subsolid left upper lobe pulmonary nodule. 3. Aortic  Atherosclerosis (ICD10-I70.0) and Emphysema (ICD10-J43.9). Electronically Signed   By: Maudry Mayhew M.D.   On: 09/12/2023 14:56        IMPRESSION/PLAN: 1. 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 ***.  In a visit lasting *** minutes, greater than 50% of the time was spent face to face discussing the patient's condition, in preparation for the discussion, and coordinating the patient's care.      Osker Mason, PAC

## 2023-09-26 ENCOUNTER — Encounter: Payer: Self-pay | Admitting: Radiation Oncology

## 2023-09-26 ENCOUNTER — Ambulatory Visit
Admission: RE | Admit: 2023-09-26 | Discharge: 2023-09-26 | Disposition: A | Discharge status: Home / Self Care | Payer: Medicare HMO | Source: Ambulatory Visit | Attending: Radiation Oncology | Admitting: Radiation Oncology

## 2023-09-26 VITALS — Ht 73.0 in | Wt 184.0 lb

## 2023-09-26 DIAGNOSIS — C3411 Malignant neoplasm of upper lobe, right bronchus or lung: Secondary | ICD-10-CM

## 2023-09-26 NOTE — Progress Notes (Signed)
 Nursing interview for Malignant neoplasm of upper lobe of right lung (HCC).   Patient identity verified x2.  Patient reports doing well. Patient denies chest/arm pain, SOB, dizziness and any other related issues at this time.  Meaningful use complete.  Vitals-limited via phone Ht 6\' 1"  (1.854 m)   Wt 184 lb (83.5 kg)   BMI 24.28 kg/m    This concludes the interaction.  Ruel Favors, LPN

## 2023-09-28 ENCOUNTER — Other Ambulatory Visit: Payer: Self-pay | Admitting: Radiation Therapy

## 2023-09-28 ENCOUNTER — Telehealth: Payer: Self-pay | Admitting: Radiation Therapy

## 2023-09-28 DIAGNOSIS — C7931 Secondary malignant neoplasm of brain: Secondary | ICD-10-CM

## 2023-09-28 NOTE — Telephone Encounter (Signed)
 Called and left a detailed message about his upcoming brain MRI on 7/29 and telephone follow-up with Jill Side on 8/5. I requested a call back with questions, concerns or conflicts. [facility]. I included this in the message to make him aware of the timeframe we need to complete this scan.   Jalene Mullet R.T.(R)(T) Radiation Special Procedures Lead

## 2023-11-16 ENCOUNTER — Other Ambulatory Visit: Payer: Self-pay

## 2023-11-16 DIAGNOSIS — I70201 Unspecified atherosclerosis of native arteries of extremities, right leg: Secondary | ICD-10-CM | POA: Diagnosis not present

## 2023-11-16 DIAGNOSIS — I7789 Other specified disorders of arteries and arterioles: Secondary | ICD-10-CM | POA: Diagnosis not present

## 2023-11-16 DIAGNOSIS — I998 Other disorder of circulatory system: Secondary | ICD-10-CM | POA: Diagnosis not present

## 2023-11-16 DIAGNOSIS — I739 Peripheral vascular disease, unspecified: Secondary | ICD-10-CM

## 2023-11-16 DIAGNOSIS — Z95828 Presence of other vascular implants and grafts: Secondary | ICD-10-CM | POA: Diagnosis not present

## 2023-11-16 DIAGNOSIS — T82856A Stenosis of peripheral vascular stent, initial encounter: Secondary | ICD-10-CM | POA: Diagnosis not present

## 2023-11-16 DIAGNOSIS — I70221 Atherosclerosis of native arteries of extremities with rest pain, right leg: Secondary | ICD-10-CM

## 2023-11-19 ENCOUNTER — Ambulatory Visit (HOSPITAL_COMMUNITY)
Admission: RE | Admit: 2023-11-19 | Discharge: 2023-11-19 | Disposition: A | Discharge status: Home / Self Care | Source: Ambulatory Visit | Attending: Surgery | Admitting: Surgery

## 2023-11-19 DIAGNOSIS — I739 Peripheral vascular disease, unspecified: Secondary | ICD-10-CM | POA: Insufficient documentation

## 2023-11-19 DIAGNOSIS — I70221 Atherosclerosis of native arteries of extremities with rest pain, right leg: Secondary | ICD-10-CM | POA: Insufficient documentation

## 2023-11-20 LAB — VAS US ABI WITH/WO TBI
Left ABI: 1.09
Right ABI: 0.41

## 2023-11-21 ENCOUNTER — Encounter: Payer: Self-pay | Admitting: Vascular Surgery

## 2023-11-21 ENCOUNTER — Ambulatory Visit: Attending: Vascular Surgery | Admitting: Vascular Surgery

## 2023-11-21 VITALS — BP 148/94 | HR 84 | Temp 98.0°F | Ht 73.0 in | Wt 172.0 lb

## 2023-11-21 DIAGNOSIS — I70221 Atherosclerosis of native arteries of extremities with rest pain, right leg: Secondary | ICD-10-CM

## 2023-11-21 NOTE — Progress Notes (Signed)
 Patient ID: Randy Andersen, male   DOB: February 12, 1956, 68 y.o.   MRN: [id number]  Reason for Consult: Follow-up   Referred by [name], MD  Subjective:     HPI:  Randy Andersen is a 68 y.o. male history of right SFA stenting for life-limiting claudication.  Unfortunately his stents are noted to have occluded.  Patient continues to smoke states that he does not take Plavix  or Xarelto  at this time.  He now has rest pain and skin discoloration of the right foot and was evaluated in [facility] emergency department 5 days ago with CT angio for worsening pain in the right foot.  He states that the pain is somewhat better after receiving pain medicine but he is having pain particularly at night but denies any tissue loss or ulceration.  Past Medical History:  Diagnosis Date   Asthma    as a child   Chronic pain disorder    LT leg and foot   Heart murmur    as a child   Hypertension    Malignant neoplasm of upper lobe of right lung (HCC)    Stroke (HCC)    mini stroke - 2015, some tingling in fingers on right    Family History  Problem Relation Age of Onset   Cancer Mother        Intestinal metastatic to ovaries   Past Surgical History:  Procedure Laterality Date   ABDOMINAL AORTOGRAM W/LOWER EXTREMITY N/A 01/15/2023   Procedure: ABDOMINAL AORTOGRAM W/LOWER EXTREMITY;  Surgeon: [name], MD;  Location: [facility] INVASIVE CV LAB;  Service: Cardiovascular;  Laterality: N/A;   BUNIONECTOMY Left    had hammer toe surgery also and callus removed   BUNIONECTOMY Right    also had hammer toe surgery and callus removed   LEG SURGERY Left    PT reports he has pins placed in his Lt leg   ORIF TIBIA PLATEAU  10/09/2012   Dr Murrel Arnt   ORIF TIBIA PLATEAU Left 10/08/2012   Procedure: OPEN REDUCTION INTERNAL FIXATION (ORIF) TIBIAL PLATEAU;  Surgeon: [name], MD;  Location: [facility] OR;  Service: Orthopedics;  Laterality: Left;   PERIPHERAL VASCULAR ATHERECTOMY  01/15/2023   Procedure: PERIPHERAL  VASCULAR ATHERECTOMY;  Surgeon: [name], MD;  Location: [facility] INVASIVE CV LAB;  Service: Cardiovascular;;   PERIPHERAL VASCULAR INTERVENTION  01/15/2023   Procedure: PERIPHERAL VASCULAR INTERVENTION;  Surgeon: [name], MD;  Location: [facility] INVASIVE CV LAB;  Service: Cardiovascular;;   VIDEO BRONCHOSCOPY WITH ENDOBRONCHIAL NAVIGATION N/A 02/25/2020   Procedure: VIDEO BRONCHOSCOPY WITH ENDOBRONCHIAL NAVIGATION with biopsies;  Surgeon: [name], MD;  Location: [facility] OR;  Service: Thoracic;  Laterality: N/A;   VIDEO BRONCHOSCOPY WITH ENDOBRONCHIAL ULTRASOUND N/A 02/25/2020   Procedure: VIDEO BRONCHOSCOPY WITH ENDOBRONCHIAL ULTRASOUND;  Surgeon: [name], MD;  Location: [facility] OR;  Service: Thoracic;  Laterality: N/A;    Short Social History:  Social History   Tobacco Use   Smoking status: Every Day    Current packs/day: 0.50    Average packs/day: 0.5 packs/day for 28.0 years (14.0 ttl pk-yrs)    Types: Cigarettes   Smokeless tobacco: Never  Substance Use Topics   Alcohol use: No    No Known Allergies  Current Outpatient Medications  Medication Sig Dispense Refill   amLODipine  (NORVASC ) 10 MG tablet Take 10 mg by mouth daily.     Cholecalciferol 100 MCG (4000 UT) TABS Take 2 tablets by mouth daily.     clopidogrel  (  PLAVIX ) 75 MG tablet Take 1 tablet (75 mg total) by mouth daily. 30 tablet 11   folic acid  (FOLVITE ) 1 MG tablet Take 1 mg by mouth daily.     naloxone (NARCAN) nasal spray 4 mg/0.1 mL Place 0.4 mg into the nose once.     nicotine  (NICODERM CQ ) 21 mg/24hr patch Place 1 patch (21 mg total) onto the skin daily. 28 patch 2   oxybutynin (DITROPAN-XL) 5 MG 24 hr tablet Take 10 mg by mouth daily as needed (Bladder control).     oxyCODONE -acetaminophen  (PERCOCET) 10-325 MG tablet Take 1 tablet by mouth every 6 (six) hours as needed for pain.     rosuvastatin  (CRESTOR ) 10 MG tablet Take 1 tablet (10 mg total) by mouth daily. 30 tablet 11   tadalafil  (CIALIS) 20 MG tablet Take 20 mg by mouth daily as needed for erectile dysfunction.     tamsulosin  (FLOMAX ) 0.4 MG CAPS capsule Take 1 capsule (0.4 mg total) by mouth daily. 30 capsule 2   rivaroxaban  (XARELTO ) 20 MG TABS tablet TAKE 1 TABLET BY MOUTH ONCE A DAY WITH SUPPER (Patient taking differently: Take 20 mg by mouth daily with supper.) 30 tablet 2   No current facility-administered medications for this visit.    Review of Systems  Constitutional:  Constitutional negative. HENT: HENT negative.  Eyes: Eyes negative.  Cardiovascular: Positive for claudication.  GI: Gastrointestinal negative.  Musculoskeletal: Positive for leg pain.  Neurological: Neurological negative. Hematologic: Hematologic/lymphatic negative.  Psychiatric: Psychiatric negative.        Objective:  Objective   Vitals:   11/21/23 1215  BP: (!) 148/94  Pulse: 84  Temp: 98 F (36.7 C)  SpO2: 98%  Weight: 172 lb (78 kg)  Height: 6\' 1"  (1.854 m)   Body mass index is 22.69 kg/m.  Physical Exam HENT:     Head: Normocephalic.     Nose: Nose normal.  Eyes:     Pupils: Pupils are equal, round, and reactive to light.  Cardiovascular:     Pulses:          Femoral pulses are 2+ on the right side and 2+ on the left side.      Popliteal pulses are 0 on the right side.       Dorsalis pedis pulses are detected w/ Doppler on the right side.       Posterior tibial pulses are 0 on the right side.  Pulmonary:     Effort: Pulmonary effort is normal.  Abdominal:     General: Abdomen is flat.  Musculoskeletal:     Comments: Toes on the right are dark discolored relative to the left  Skin:    Capillary Refill: Capillary refill takes more than 3 seconds.  Neurological:     General: No focal deficit present.     Mental Status: He is alert.  Psychiatric:        Mood and Affect: Mood normal.        Thought Content: Thought content normal.        Judgment: Judgment normal.     Data: ABI Findings:   +---------+------------------+-----+----------+--------+  Right   Rt Pressure (mmHg)IndexWaveform  Comment   +---------+------------------+-----+----------+--------+  Brachial 151                                        +---------+------------------+-----+----------+--------+  ATA     67  0.41 monophasic          +---------+------------------+-----+----------+--------+  PTA     61                0.37 monophasic          +---------+------------------+-----+----------+--------+  Great Toe0                 0.00                     +---------+------------------+-----+----------+--------+   +---------+------------------+-----+-----------+-------+  Left    Lt Pressure (mmHg)IndexWaveform   Comment  +---------+------------------+-----+-----------+-------+  Brachial 164                                        +---------+------------------+-----+-----------+-------+  ATA     175               1.07 triphasic           +---------+------------------+-----+-----------+-------+  PTA     178               1.09 multiphasic         +---------+------------------+-----+-----------+-------+  Great Toe116               0.71                     +---------+------------------+-----+-----------+-------+   +-------+-----------+-----------+------------+------------+  ABI/TBIToday's ABIToday's TBIPrevious ABIPrevious TBI  +-------+-----------+-----------+------------+------------+  Right 0.41       0          0.27        0             +-------+-----------+-----------+------------+------------+  Left  1.09       0.71       1.02        0.77          +-------+-----------+-----------+------------+------------+      Previous ABI on 03/30/23.    Summary:  Right: Resting right ankle-brachial index indicates severe right lower  extremity arterial disease. The right toe-brachial index is abnormal.   Left: Resting left  ankle-brachial index is within normal range. The left  toe-brachial index is normal.        Assessment/Plan:     68 year old male with occluded right lower extremity stents with atherosclerosis native arteries with right lower extremity rest pain.  He recently was evaluated in the [facility] emergency department for pain in the foot was noted to have occluded SFA with reconstitution of the popliteal artery.  We discussed proceeding with angiography from the left common femoral approach more than likely would not be an endovascular candidate particularly given his ongoing smoking and short durability of previous endovascular intervention.  As such this would likely be diagnostic to plan bypass.  We discussed the risk benefits alternatives and he demonstrates good understanding today.  Randy Andersen has atherosclerosis of the native arteries of the Right lower extremities causing ischemic rest pain. The patient is on best medical therapy for peripheral arterial disease. The patient has been counseled about the risks of tobacco use in atherosclerotic disease. The patient has been counseled to abstain from any tobacco use. An aortogram with bilateral lower extremity runoff angiography and Right lower extremity intervention and is indicated to better evaluate the patient's lower extremity circulation because of the limb threatening nature of the patient's diagnosis. Based on the patient's clinical exam and  non-invasive data, we anticipate an endovascular intervention in the femoropopliteal vessels. Stenting and/or athrectomy would be favored because of the improved primary patency of these interventions as compared to plain balloon angioplasty.      [name] MD [facility]

## 2023-11-21 NOTE — H&P (View-Only) (Signed)
 Patient ID: Randy Andersen, male   DOB: February 12, 1956, 68 y.o.   MRN: [id number]  Reason for Consult: Follow-up   Referred by Randy Penton, MD  Subjective:     HPI:  Randy Andersen is a 68 y.o. male history of right SFA stenting for life-limiting claudication.  Unfortunately his stents are noted to have occluded.  Patient continues to smoke states that he does not take Plavix  or Xarelto  at this time.  He now has rest pain and skin discoloration of the right foot and was evaluated in [facility] emergency department 5 days ago with CT angio for worsening pain in the right foot.  He states that the pain is somewhat better after receiving pain medicine but he is having pain particularly at night but denies any tissue loss or ulceration.  Past Medical History:  Diagnosis Date   Asthma    as a child   Chronic pain disorder    LT leg and foot   Heart murmur    as a child   Hypertension    Malignant neoplasm of upper lobe of right lung (HCC)    Stroke (HCC)    mini stroke - 2015, some tingling in fingers on right    Family History  Problem Relation Age of Onset   Cancer Mother        Intestinal metastatic to ovaries   Past Surgical History:  Procedure Laterality Date   ABDOMINAL AORTOGRAM W/LOWER EXTREMITY N/A 01/15/2023   Procedure: ABDOMINAL AORTOGRAM W/LOWER EXTREMITY;  Surgeon: [name], MD;  Location: [facility] INVASIVE CV LAB;  Service: Cardiovascular;  Laterality: N/A;   BUNIONECTOMY Left    had hammer toe surgery also and callus removed   BUNIONECTOMY Right    also had hammer toe surgery and callus removed   LEG SURGERY Left    PT reports he has pins placed in his Lt leg   ORIF TIBIA PLATEAU  10/09/2012   Dr Murrel Arnt   ORIF TIBIA PLATEAU Left 10/08/2012   Procedure: OPEN REDUCTION INTERNAL FIXATION (ORIF) TIBIAL PLATEAU;  Surgeon: [name], MD;  Location: [facility] OR;  Service: Orthopedics;  Laterality: Left;   PERIPHERAL VASCULAR ATHERECTOMY  01/15/2023   Procedure: PERIPHERAL  VASCULAR ATHERECTOMY;  Surgeon: [name], MD;  Location: [facility] INVASIVE CV LAB;  Service: Cardiovascular;;   PERIPHERAL VASCULAR INTERVENTION  01/15/2023   Procedure: PERIPHERAL VASCULAR INTERVENTION;  Surgeon: [name], MD;  Location: [facility] INVASIVE CV LAB;  Service: Cardiovascular;;   VIDEO BRONCHOSCOPY WITH ENDOBRONCHIAL NAVIGATION N/A 02/25/2020   Procedure: VIDEO BRONCHOSCOPY WITH ENDOBRONCHIAL NAVIGATION with biopsies;  Surgeon: [name], MD;  Location: [facility] OR;  Service: Thoracic;  Laterality: N/A;   VIDEO BRONCHOSCOPY WITH ENDOBRONCHIAL ULTRASOUND N/A 02/25/2020   Procedure: VIDEO BRONCHOSCOPY WITH ENDOBRONCHIAL ULTRASOUND;  Surgeon: [name], MD;  Location: [facility] OR;  Service: Thoracic;  Laterality: N/A;    Short Social History:  Social History   Tobacco Use   Smoking status: Every Day    Current packs/day: 0.50    Average packs/day: 0.5 packs/day for 28.0 years (14.0 ttl pk-yrs)    Types: Cigarettes   Smokeless tobacco: Never  Substance Use Topics   Alcohol use: No    No Known Allergies  Current Outpatient Medications  Medication Sig Dispense Refill   amLODipine  (NORVASC ) 10 MG tablet Take 10 mg by mouth daily.     Cholecalciferol 100 MCG (4000 UT) TABS Take 2 tablets by mouth daily.     clopidogrel  (  PLAVIX ) 75 MG tablet Take 1 tablet (75 mg total) by mouth daily. 30 tablet 11   folic acid  (FOLVITE ) 1 MG tablet Take 1 mg by mouth daily.     naloxone (NARCAN) nasal spray 4 mg/0.1 mL Place 0.4 mg into the nose once.     nicotine  (NICODERM CQ ) 21 mg/24hr patch Place 1 patch (21 mg total) onto the skin daily. 28 patch 2   oxybutynin (DITROPAN-XL) 5 MG 24 hr tablet Take 10 mg by mouth daily as needed (Bladder control).     oxyCODONE -acetaminophen  (PERCOCET) 10-325 MG tablet Take 1 tablet by mouth every 6 (six) hours as needed for pain.     rosuvastatin  (CRESTOR ) 10 MG tablet Take 1 tablet (10 mg total) by mouth daily. 30 tablet 11   tadalafil  (CIALIS) 20 MG tablet Take 20 mg by mouth daily as needed for erectile dysfunction.     tamsulosin  (FLOMAX ) 0.4 MG CAPS capsule Take 1 capsule (0.4 mg total) by mouth daily. 30 capsule 2   rivaroxaban  (XARELTO ) 20 MG TABS tablet TAKE 1 TABLET BY MOUTH ONCE A DAY WITH SUPPER (Patient taking differently: Take 20 mg by mouth daily with supper.) 30 tablet 2   No current facility-administered medications for this visit.    Review of Systems  Constitutional:  Constitutional negative. HENT: HENT negative.  Eyes: Eyes negative.  Cardiovascular: Positive for claudication.  GI: Gastrointestinal negative.  Musculoskeletal: Positive for leg pain.  Neurological: Neurological negative. Hematologic: Hematologic/lymphatic negative.  Psychiatric: Psychiatric negative.        Objective:  Objective   Vitals:   11/21/23 1215  BP: (!) 148/94  Pulse: 84  Temp: 98 F (36.7 C)  SpO2: 98%  Weight: 172 lb (78 kg)  Height: 6\' 1"  (1.854 m)   Body mass index is 22.69 kg/m.  Physical Exam HENT:     Head: Normocephalic.     Nose: Nose normal.  Eyes:     Pupils: Pupils are equal, round, and reactive to light.  Cardiovascular:     Pulses:          Femoral pulses are 2+ on the right side and 2+ on the left side.      Popliteal pulses are 0 on the right side.       Dorsalis pedis pulses are detected w/ Doppler on the right side.       Posterior tibial pulses are 0 on the right side.  Pulmonary:     Effort: Pulmonary effort is normal.  Abdominal:     General: Abdomen is flat.  Musculoskeletal:     Comments: Toes on the right are dark discolored relative to the left  Skin:    Capillary Refill: Capillary refill takes more than 3 seconds.  Neurological:     General: No focal deficit present.     Mental Status: He is alert.  Psychiatric:        Mood and Affect: Mood normal.        Thought Content: Thought content normal.        Judgment: Judgment normal.     Data: ABI Findings:   +---------+------------------+-----+----------+--------+  Right   Rt Pressure (mmHg)IndexWaveform  Comment   +---------+------------------+-----+----------+--------+  Brachial 151                                        +---------+------------------+-----+----------+--------+  ATA     67  0.41 monophasic          +---------+------------------+-----+----------+--------+  PTA     61                0.37 monophasic          +---------+------------------+-----+----------+--------+  Great Toe0                 0.00                     +---------+------------------+-----+----------+--------+   +---------+------------------+-----+-----------+-------+  Left    Lt Pressure (mmHg)IndexWaveform   Comment  +---------+------------------+-----+-----------+-------+  Brachial 164                                        +---------+------------------+-----+-----------+-------+  ATA     175               1.07 triphasic           +---------+------------------+-----+-----------+-------+  PTA     178               1.09 multiphasic         +---------+------------------+-----+-----------+-------+  Great Toe116               0.71                     +---------+------------------+-----+-----------+-------+   +-------+-----------+-----------+------------+------------+  ABI/TBIToday's ABIToday's TBIPrevious ABIPrevious TBI  +-------+-----------+-----------+------------+------------+  Right 0.41       0          0.27        0             +-------+-----------+-----------+------------+------------+  Left  1.09       0.71       1.02        0.77          +-------+-----------+-----------+------------+------------+      Previous ABI on 03/30/23.    Summary:  Right: Resting right ankle-brachial index indicates severe right lower  extremity arterial disease. The right toe-brachial index is abnormal.   Left: Resting left  ankle-brachial index is within normal range. The left  toe-brachial index is normal.        Assessment/Plan:     68 year old male with occluded right lower extremity stents with atherosclerosis native arteries with right lower extremity rest pain.  He recently was evaluated in the [facility] emergency department for pain in the foot was noted to have occluded SFA with reconstitution of the popliteal artery.  We discussed proceeding with angiography from the left common femoral approach more than likely would not be an endovascular candidate particularly given his ongoing smoking and short durability of previous endovascular intervention.  As such this would likely be diagnostic to plan bypass.  We discussed the risk benefits alternatives and he demonstrates good understanding today.  Ova Voth has atherosclerosis of the native arteries of the Right lower extremities causing ischemic rest pain. The patient is on best medical therapy for peripheral arterial disease. The patient has been counseled about the risks of tobacco use in atherosclerotic disease. The patient has been counseled to abstain from any tobacco use. An aortogram with bilateral lower extremity runoff angiography and Right lower extremity intervention and is indicated to better evaluate the patient's lower extremity circulation because of the limb threatening nature of the patient's diagnosis. Based on the patient's clinical exam and  non-invasive data, we anticipate an endovascular intervention in the femoropopliteal vessels. Stenting and/or athrectomy would be favored because of the improved primary patency of these interventions as compared to plain balloon angioplasty.      [name] MD [facility]

## 2023-11-22 ENCOUNTER — Other Ambulatory Visit: Payer: Self-pay

## 2023-11-22 DIAGNOSIS — I70221 Atherosclerosis of native arteries of extremities with rest pain, right leg: Secondary | ICD-10-CM

## 2023-11-26 ENCOUNTER — Other Ambulatory Visit: Payer: Self-pay

## 2023-11-26 ENCOUNTER — Telehealth: Payer: Self-pay

## 2023-11-26 ENCOUNTER — Encounter (HOSPITAL_COMMUNITY)
Admission: RE | Disposition: A | Discharge status: Home / Self Care | Payer: Self-pay | Source: Home / Self Care | Attending: Vascular Surgery

## 2023-11-26 ENCOUNTER — Ambulatory Visit (HOSPITAL_COMMUNITY)
Admission: RE | Admit: 2023-11-26 | Discharge: 2023-11-26 | Disposition: A | Discharge status: Home / Self Care | Attending: Vascular Surgery | Admitting: Vascular Surgery

## 2023-11-26 DIAGNOSIS — I70221 Atherosclerosis of native arteries of extremities with rest pain, right leg: Secondary | ICD-10-CM | POA: Insufficient documentation

## 2023-11-26 DIAGNOSIS — F17210 Nicotine dependence, cigarettes, uncomplicated: Secondary | ICD-10-CM | POA: Diagnosis not present

## 2023-11-26 DIAGNOSIS — Z95820 Peripheral vascular angioplasty status with implants and grafts: Secondary | ICD-10-CM | POA: Insufficient documentation

## 2023-11-26 HISTORY — PX: LOWER EXTREMITY ANGIOGRAPHY: CATH118251

## 2023-11-26 HISTORY — PX: ABDOMINAL AORTOGRAM W/LOWER EXTREMITY: CATH118223

## 2023-11-26 LAB — POCT I-STAT, CHEM 8
BUN: 12 mg/dL (ref 8–23)
Calcium, Ion: 1.26 mmol/L (ref 1.15–1.40)
Chloride: 102 mmol/L (ref 98–111)
Creatinine, Ser: 2 mg/dL — ABNORMAL HIGH (ref 0.61–1.24)
Glucose, Bld: 84 mg/dL (ref 70–99)
HCT: 44 % (ref 39.0–52.0)
Hemoglobin: 15 g/dL (ref 13.0–17.0)
Potassium: 3.6 mmol/L (ref 3.5–5.1)
Sodium: 142 mmol/L (ref 135–145)
TCO2: 26 mmol/L (ref 22–32)

## 2023-11-26 SURGERY — ABDOMINAL AORTOGRAM W/LOWER EXTREMITY
Anesthesia: LOCAL

## 2023-11-26 MED ORDER — ACETAMINOPHEN 325 MG PO TABS: 650.0000 mg | ORAL_TABLET | ORAL | Status: DC | PRN

## 2023-11-26 MED ORDER — LABETALOL HCL 5 MG/ML IV SOLN
10.0000 mg | INTRAVENOUS | Status: DC | PRN
  Administered 2023-11-26 (×2): 10 mg via INTRAVENOUS
  Filled 2023-11-26: qty 4

## 2023-11-26 MED ORDER — SODIUM CHLORIDE 0.9 % IV SOLN: 250.0000 mL | INTRAVENOUS | Status: DC | PRN

## 2023-11-26 MED ORDER — MIDAZOLAM HCL 2 MG/2ML IJ SOLN
INTRAMUSCULAR | Status: AC
  Filled 2023-11-26: qty 2

## 2023-11-26 MED ORDER — FENTANYL CITRATE (PF) 100 MCG/2ML IJ SOLN
INTRAMUSCULAR | Status: AC
Start: 2023-11-26 — End: ?
  Filled 2023-11-26: qty 2

## 2023-11-26 MED ORDER — SODIUM CHLORIDE 0.9% FLUSH: 3.0000 mL | INTRAVENOUS | Status: DC | PRN

## 2023-11-26 MED ORDER — LIDOCAINE HCL (PF) 1 % IJ SOLN
INTRAMUSCULAR | Status: AC
  Filled 2023-11-26: qty 30

## 2023-11-26 MED ORDER — ONDANSETRON HCL 4 MG/2ML IJ SOLN: 4.0000 mg | Freq: Four times a day (QID) | INTRAMUSCULAR | Status: DC | PRN

## 2023-11-26 MED ORDER — LIDOCAINE HCL (PF) 1 % IJ SOLN
INTRAMUSCULAR | Status: DC | PRN
  Administered 2023-11-26: 10 mL

## 2023-11-26 MED ORDER — OXYCODONE HCL 5 MG PO TABS: 5.0000 mg | ORAL_TABLET | ORAL | Status: DC | PRN

## 2023-11-26 MED ORDER — SODIUM CHLORIDE 0.9% FLUSH: 3.0000 mL | Freq: Two times a day (BID) | INTRAVENOUS | Status: DC

## 2023-11-26 MED ORDER — SODIUM CHLORIDE 0.9 % IV SOLN: INTRAVENOUS | Status: DC

## 2023-11-26 MED ORDER — IODIXANOL 320 MG/ML IV SOLN
INTRAVENOUS | Status: DC | PRN
  Administered 2023-11-26: 24 mL

## 2023-11-26 MED ORDER — HEPARIN (PORCINE) IN NACL 1000-0.9 UT/500ML-% IV SOLN
INTRAVENOUS | Status: DC | PRN
  Administered 2023-11-26: 1000 mL

## 2023-11-26 MED ORDER — FENTANYL CITRATE (PF) 100 MCG/2ML IJ SOLN
INTRAMUSCULAR | Status: DC | PRN
  Administered 2023-11-26: 50 ug via INTRAVENOUS

## 2023-11-26 MED ORDER — SODIUM CHLORIDE 0.9 % WEIGHT BASED INFUSION: 1.0000 mL/kg/h | INTRAVENOUS | Status: DC

## 2023-11-26 MED ORDER — MIDAZOLAM HCL 2 MG/2ML IJ SOLN
INTRAMUSCULAR | Status: DC | PRN
  Administered 2023-11-26: 1 mg via INTRAVENOUS

## 2023-11-26 MED ORDER — HYDRALAZINE HCL 20 MG/ML IJ SOLN: 5.0000 mg | INTRAMUSCULAR | Status: DC | PRN

## 2023-11-26 SURGICAL SUPPLY — 10 items
CATH OMNI FLUSH 5F 65CM (CATHETERS) IMPLANT
CLOSURE MYNX CONTROL 5F (Vascular Products) IMPLANT
KIT ANGIASSIST CO2 SYSTEM (KITS) IMPLANT
KIT MICROPUNCTURE NIT STIFF (SHEATH) IMPLANT
KIT SINGLE USE MANIFOLD (KITS) IMPLANT
KIT SYRINGE INJ CVI SPIKEX1 (MISCELLANEOUS) IMPLANT
SET ATX-X65L (MISCELLANEOUS) IMPLANT
SHEATH PINNACLE 5F 10CM (SHEATH) IMPLANT
TRAY PV CATH (CUSTOM PROCEDURE TRAY) ×1 IMPLANT
WIRE BENTSON .035X145CM (WIRE) IMPLANT

## 2023-11-26 NOTE — Telephone Encounter (Signed)
 Cardiac Clearance request for new patient sent to [facility].

## 2023-11-26 NOTE — Progress Notes (Signed)
Pt ambulated to and from bathroom to void with no signs of oozing from left groin site  

## 2023-11-26 NOTE — Interval H&P Note (Signed)
 History and Physical Interval Note:  11/26/2023 7:18 AM  Randy Andersen  has presented today for surgery, with the diagnosis of atheroscelosis, Rt LE pain with rest pain.  The various methods of treatment have been discussed with the patient and family. After consideration of risks, benefits and other options for treatment, the patient has consented to  Procedure(s): ABDOMINAL AORTOGRAM W/LOWER EXTREMITY (N/A) Lower Extremity Angiography (N/A) as a surgical intervention.  The patient's history has been reviewed, patient examined, no change in status, stable for surgery.  I have reviewed the patient's chart and labs.  Questions were answered to the patient's satisfaction.     Angela Kell

## 2023-11-26 NOTE — Op Note (Signed)
    Patient name: Randy Andersen MRN: [id number] DOB: 12-01-1955 Sex: male  11/26/2023 Pre-operative Diagnosis: Atherosclerosis native arteries right lower extremity with rest pain Post-operative diagnosis:  Same Surgeon:  Sarajane Cumming. Vikki Graves, MD Procedure Performed: 1.  Percutaneous ultrasound-guided cannulation and Mynx device closure left common femoral artery 2.  Catheter selection of aorta and aortogram with bilateral lower extremity angiography 3.  Catheter selection right common femoral artery 4.  Moderate sedation with fentanyl  and Versed  for 22 minutes  Indications: 68 year old male with history of SFA stenting for life-limiting claudication now with known occluded stents and rest pain with severely depressed ABIs and toe pressure of 0.  He is now indicated for angiography with possible intervention.  Findings: The aorta and iliac segments were evaluated with CO2 did not appear to have any flow-limiting stenoses.  The left common femoral and SFA and profunda are patent.  On the right side there is flush occlusion of the SFA and distally there are 2 long stents which are occluded he reconstitutes just above the knee and it appears to have dominant runoff via the posterior tibial artery on the right.  Plan will be for right common femoral to below-knee popliteal artery bypass after cardiac clearance   Procedure:  The patient was identified in the holding area and taken to room 8.  The patient was then placed supine on the table and prepped and draped in the usual sterile fashion.  A time out was called.  Ultrasound was used to evaluate the left common femoral artery which was noted to be patent and soft.  The area was1% lidocaine  and cannulated with a micropuncture needle followed by wire and a sheath.  Concomitantly we administered fentanyl  and Versed  as moderate sedation his vital signs were monitored throughout the case.  We placed a Bentson wire followed by 5 [place] sheath and Omni catheter to  the level of L1 and performed aortogram followed by pelvic angiography with CO2.  We then crossed the bifurcation with Bentson wire and Omni catheter perform right lower extremity angiography with the above findings.  The catheter was removed over a wire and Mynx device was deployed.  He tolerated the procedure without immediate complication.  Contrast: 24 cc     Jamye Balicki C. Vikki Graves, MD [facility] [place] Office: [phone] Pager: [phone]

## 2023-11-27 ENCOUNTER — Encounter (HOSPITAL_COMMUNITY): Payer: Self-pay | Admitting: Vascular Surgery

## 2023-12-17 NOTE — Progress Notes (Deleted)
  Cardiology Office Note:  .   Date:  12/17/2023  ID:  Finnegan Gatta, DOB Jun 03, 1956, MRN [id number] PCP: [name], MD  [facility] Providers Cardiologist:  None { Click to update primary MD,subspecialty MD or APP then REFRESH:1}   History of Present Illness: .   No chief complaint on file.   Ewell Benassi is a 68 y.o. male with history of PAD who presents for the evaluation of preop assessment at the request of Lorina Roosevelt*.     Problem List PAD -R SFA/pop stent 01/15/2023 2. Stage IV NSCLC with brain mets  -completed immunotherapy  3. HTN 4. HLD    ROS: All other ROS reviewed and negative. Pertinent positives noted in the HPI.     Studies Reviewed: Aaron Aas       Abdominal aortogram 11/26/2023 Findings: The aorta and iliac segments were evaluated with CO2 did not appear to have any flow-limiting stenoses.  The left common femoral and SFA and profunda are patent.  On the right side there is flush occlusion of the SFA and distally there are 2 long stents which are occluded he reconstitutes just above the knee and it appears to have dominant runoff via the posterior tibial artery on the right.  Physical Exam:   VS:  There were no vitals taken for this visit.   Wt Readings from Last 3 Encounters:  11/26/23 180 lb (81.6 kg)  11/21/23 172 lb (78 kg)  09/26/23 184 lb (83.5 kg)    GEN: Well nourished, well developed in no acute distress NECK: No JVD; No carotid bruits CARDIAC: ***RRR, no murmurs, rubs, gallops RESPIRATORY:  Clear to auscultation without rales, wheezing or rhonchi  ABDOMEN: Soft, non-tender, non-distended EXTREMITIES:  No edema; No deformity  ASSESSMENT AND PLAN: .   ***    {Are you ordering a CV Procedure (e.g. stress test, cath, DCCV, TEE, etc)?   Press F2        :[id number]}   Follow-up: No follow-ups on file.  Time Spent with Patient: I have spent a total of *** minutes caring for this patient today face to face, ordering and reviewing  labs/tests, reviewing prior records/medical history, examining the patient, establishing an assessment and plan, communicating results/findings to the patient/family, and documenting in the medical record.   Signed, Gigi Kyle. Rolm Clos, MD, [facility]  [facility]  [address] [place], [place] [address] [phone]  1:20 PM

## 2023-12-19 ENCOUNTER — Ambulatory Visit: Admitting: Cardiovascular Disease

## 2023-12-19 DIAGNOSIS — Z01810 Encounter for preprocedural cardiovascular examination: Secondary | ICD-10-CM

## 2023-12-19 DIAGNOSIS — E782 Mixed hyperlipidemia: Secondary | ICD-10-CM

## 2023-12-19 DIAGNOSIS — I150 Renovascular hypertension: Secondary | ICD-10-CM

## 2023-12-19 DIAGNOSIS — I739 Peripheral vascular disease, unspecified: Secondary | ICD-10-CM

## 2023-12-20 ENCOUNTER — Telehealth: Payer: Self-pay

## 2023-12-20 NOTE — Telephone Encounter (Signed)
 Patient cancelled his cardiology clearance appt on 5/28.  LVM for patient.  Will send letter.

## 2024-01-09 ENCOUNTER — Encounter (HOSPITAL_BASED_OUTPATIENT_CLINIC_OR_DEPARTMENT_OTHER): Payer: Self-pay

## 2024-01-11 ENCOUNTER — Ambulatory Visit: Attending: Cardiology | Admitting: Cardiology

## 2024-01-11 NOTE — Progress Notes (Deleted)
  Cardiology Office Note:  .   Date:  01/11/2024  ID:  Randy Andersen, DOB 07-14-1956, MRN [id number] PCP: [name], MD  [facility] Providers Cardiologist:  None { Click to update primary MD,subspecialty MD or APP then REFRESH:1}  History of Present Illness: .   Randy Andersen is a 68 y.o. 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 H/O remote subsegmental PE in 2021.    Discussed the use of AI scribe software for clinical note transcription with the patient, who gave verbal consent to proceed.  History of Present Illness    Labs   Lab Results  Component Value Date   NA 142 11/26/2023   K 3.6 11/26/2023   CO2 26 12/19/2022   GLUCOSE 84 11/26/2023   BUN 12 11/26/2023   CREATININE 2.00 (H) 11/26/2023   CALCIUM  9.3 12/19/2022   GFR 56.80 (L) 02/19/2020   GFRNONAA 42 (L) 12/19/2022      Latest Ref Rng & Units 11/26/2023    5:36 AM 01/15/2023    8:24 AM 12/19/2022   10:57 AM  BMP  Glucose 70 - 99 mg/dL 84  89  366   BUN 8 - 23 mg/dL 12  17  19    Creatinine 0.61 - 1.24 mg/dL 4.40  3.47  4.25   Sodium 135 - 145 mmol/L 142  140  138   Potassium 3.5 - 5.1 mmol/L 3.6  3.4  4.3   Chloride 98 - 111 mmol/L 102  106  107   CO2 22 - 32 mmol/L   26   Calcium  8.9 - 10.3 mg/dL   9.3       Latest Ref Rng & Units 11/26/2023    5:36 AM 01/15/2023    8:24 AM 12/19/2022   10:57 AM  CBC  WBC 4.0 - 10.5 K/uL   7.0   Hemoglobin 13.0 - 17.0 g/dL 95.6  38.7  56.4   Hematocrit 39.0 - 52.0 % 44.0  46.0  45.2   Platelets 150 - 400 K/uL   206    No results found for: HGBA1C  Lab Results  Component Value Date   TSH 2.072 05/18/2022    External Labs:  ***  ROS  ***ROS  Physical Exam:   VS:  There were no vitals taken for this visit.   Wt Readings from Last 3 Encounters:  11/26/23  180 lb (81.6 kg)  11/21/23 172 lb (78 kg)  09/26/23 184 lb (83.5 kg)    ***Physical Exam Studies Reviewed: .    *** EKG:       ***  Medications ordered    No orders of the defined types were placed in this encounter.    ASSESSMENT AND PLAN: .      ICD-10-CM   1. Pre-operative cardiovascular examination  Z01.810     2. Atherosclerosis of native artery of right lower extremity with rest pain [facility])  I70.221       Assessment and Plan Assessment & Plan      Signed,  Knox Perl, MD, [facility] 01/11/2024, 7:16 AM [facility] [address] [place], [place] [address] Phone: [phone]. Fax:  [phone]

## 2024-01-14 ENCOUNTER — Encounter: Payer: Self-pay | Admitting: Cardiology

## 2024-02-19 ENCOUNTER — Inpatient Hospital Stay: Admission: RE | Admit: 2024-02-19 | Source: Ambulatory Visit

## 2024-02-22 ENCOUNTER — Telehealth: Payer: Self-pay | Admitting: Radiation Therapy

## 2024-02-22 NOTE — Telephone Encounter (Signed)
 I called and left a message for patient about the missed brain MRI and cancelled follow-up appointments. My contact information was given and a request to call back to get these rescheduled.  Devere Perch R.T(R)(T) Radiation Special Procedures Lead

## 2024-02-25 ENCOUNTER — Encounter

## 2024-02-26 ENCOUNTER — Ambulatory Visit: Admitting: Radiation Oncology

## 2024-02-26 ENCOUNTER — Telehealth: Payer: Self-pay | Admitting: Radiation Therapy

## 2024-02-26 NOTE — Telephone Encounter (Signed)
 Left a voicemail for patient requesting a call back about the missed brain MRI appointment.  Devere Perch R.T(R)(T) Radiation Special Procedures Lead

## 2024-04-07 ENCOUNTER — Telehealth: Payer: Self-pay | Admitting: Radiation Therapy

## 2024-04-07 NOTE — Telephone Encounter (Signed)
 Left message for Mr. Doxtater asking him to return my call. Also requested him to reach out to reschedule the brain MRI we have ordered for him to be completed at [facility].  Devere Perch R.T(R)(T) Radiation Special Procedures Lead

## 2024-04-07 NOTE — Progress Notes (Signed)
Orders placed in this encounter.

## 2024-04-25 ENCOUNTER — Telehealth: Payer: Self-pay | Admitting: *Deleted

## 2024-04-25 NOTE — Telephone Encounter (Signed)
 Received call from py requesting an appt with Dr. Buckley. Advised that we have had several messages concerning his [employer] insurance here. Advised that the [facility] in [place] is providing all his oncology care at this time and they have denied insurance coverage for [facility]. Advised that if patient was seen here, it would all be private pay at his expense. Pt voiced understanding and will call the [facility] in [place].

## 2024-05-08 NOTE — Progress Notes (Deleted)
  Cardiology Office Note:  .   Date:  05/08/2024  ID:  Randy Andersen, DOB 1955/08/25, MRN [id number] PCP: Milissa Savant, MD  [facility] Providers Cardiologist:  None { Click to update primary MD,subspecialty MD or APP then REFRESH:1}   History of Present Illness: .   No chief complaint on file.   Randy Andersen is a 68 y.o. male with history of PAD, HTN, HLD, CKD3b who presents for the evaluation of preop assessment at the request of [name]*.     Problem List PAD -R SFA occlusion  2. CKD 3b 3. HTN 4. HLD    ROS: All other ROS reviewed and negative. Pertinent positives noted in the HPI.     Studies Reviewed: SABRA       Physical Exam:   VS:  There were no vitals taken for this visit.   Wt Readings from Last 3 Encounters:  11/26/23 180 lb (81.6 kg)  11/21/23 172 lb (78 kg)  09/26/23 184 lb (83.5 kg)    GEN: Well nourished, well developed in no acute distress NECK: No JVD; No carotid bruits CARDIAC: ***RRR, no murmurs, rubs, gallops RESPIRATORY:  Clear to auscultation without rales, wheezing or rhonchi  ABDOMEN: Soft, non-tender, non-distended EXTREMITIES:  No edema; No deformity  ASSESSMENT AND PLAN: .   ***    {Are you ordering a CV Procedure (e.g. stress test, cath, DCCV, TEE, etc)?   Press F2        :[id number]}   Follow-up: No follow-ups on file.  Time Spent with Patient: I have spent a total of *** minutes caring for this patient today face to face, ordering and reviewing labs/tests, reviewing prior records/medical history, examining the patient, establishing an assessment and plan, communicating results/findings to the patient/family, and documenting in the medical record.   Signed, Darryle DASEN. Barbaraann, MD, [facility]  [facility]  [address] [place], [place] [address] [phone]  9:01 PM

## 2024-05-09 ENCOUNTER — Ambulatory Visit: Admitting: Cardiovascular Disease

## 2024-05-09 DIAGNOSIS — I739 Peripheral vascular disease, unspecified: Secondary | ICD-10-CM

## 2024-05-09 DIAGNOSIS — I10 Essential (primary) hypertension: Secondary | ICD-10-CM

## 2024-05-09 DIAGNOSIS — Z01810 Encounter for preprocedural cardiovascular examination: Secondary | ICD-10-CM

## 2024-05-09 DIAGNOSIS — E782 Mixed hyperlipidemia: Secondary | ICD-10-CM

## 2024-05-09 DIAGNOSIS — N1832 Chronic kidney disease, stage 3b: Secondary | ICD-10-CM

## 2024-05-26 NOTE — Progress Notes (Deleted)
 Cardiology Office Note Date:  05/26/2024  ID:  Randy Andersen, DOB [birth year], MRN [id number] PCP:  Milissa Savant, MD  Cardiologist:  Joelle VEAR Ren Donley, MD  No chief complaint on file.     Problems PAD w/  occluded R SFA stent- plan for fem-pop bypass but needs clearance. Presented with rest pain.  CKD 3b (Cr 2.0) HTN/HLD Tobacco use CAC on CT Metastatic lung cancer w/ brain mets in remission Hx PE s/p treatment w/ Xarelto  M: AE10, RN10?  Visits  11/25:    History of Present Illness: Randy Andersen is a 68 y.o. male who presents for pre-op.   ROS: Please see the history of present illness. All other systems are reviewed and negative.   Past Medical History:  Diagnosis Date   Asthma    as a child   Chronic pain disorder    LT leg and foot   Heart murmur    as a child   Hypertension    Malignant neoplasm of upper lobe of right lung (HCC)    Stroke (HCC)    mini stroke - 2015, some tingling in fingers on right     Past Surgical History:  Procedure Laterality Date   ABDOMINAL AORTOGRAM W/LOWER EXTREMITY N/A 01/15/2023   Procedure: ABDOMINAL AORTOGRAM W/LOWER EXTREMITY;  Surgeon: Sheree Penne Bruckner, MD;  Location: [facility] INVASIVE CV LAB;  Service: Cardiovascular;  Laterality: N/A;   ABDOMINAL AORTOGRAM W/LOWER EXTREMITY N/A 11/26/2023   Procedure: ABDOMINAL AORTOGRAM W/LOWER EXTREMITY;  Surgeon: Sheree Penne Bruckner, MD;  Location: [facility] INVASIVE CV LAB;  Service: Cardiovascular;  Laterality: N/A;   BUNIONECTOMY Left    had hammer toe surgery also and callus removed   BUNIONECTOMY Right    also had hammer toe surgery and callus removed   LEG SURGERY Left    PT reports he has pins placed in his Lt leg   LOWER EXTREMITY ANGIOGRAPHY N/A 11/26/2023   Procedure: Lower Extremity Angiography;  Surgeon: Sheree Penne Bruckner, MD;  Location: [facility] INVASIVE CV LAB;  Service: Cardiovascular;  Laterality: N/A;   ORIF TIBIA PLATEAU  10/09/2012   Dr Barbarann   ORIF TIBIA  PLATEAU Left 10/08/2012   Procedure: OPEN REDUCTION INTERNAL FIXATION (ORIF) TIBIAL PLATEAU;  Surgeon: Oneil JAYSON Barbarann, MD;  Location: [facility] OR;  Service: Orthopedics;  Laterality: Left;   PERIPHERAL VASCULAR ATHERECTOMY  01/15/2023   Procedure: PERIPHERAL VASCULAR ATHERECTOMY;  Surgeon: Sheree Penne Bruckner, MD;  Location: [facility] INVASIVE CV LAB;  Service: Cardiovascular;;   PERIPHERAL VASCULAR INTERVENTION  01/15/2023   Procedure: PERIPHERAL VASCULAR INTERVENTION;  Surgeon: Sheree Penne Bruckner, MD;  Location: [facility] INVASIVE CV LAB;  Service: Cardiovascular;;   VIDEO BRONCHOSCOPY WITH ENDOBRONCHIAL NAVIGATION N/A 02/25/2020   Procedure: VIDEO BRONCHOSCOPY WITH ENDOBRONCHIAL NAVIGATION with biopsies;  Surgeon: Shelah Lamar RAMAN, MD;  Location: [facility] OR;  Service: Thoracic;  Laterality: N/A;   VIDEO BRONCHOSCOPY WITH ENDOBRONCHIAL ULTRASOUND N/A 02/25/2020   Procedure: VIDEO BRONCHOSCOPY WITH ENDOBRONCHIAL ULTRASOUND;  Surgeon: Shelah Lamar RAMAN, MD;  Location: [facility] OR;  Service: Thoracic;  Laterality: N/A;    Current Outpatient Medications  Medication Sig Dispense Refill   amLODipine  (NORVASC ) 10 MG tablet Take 10 mg by mouth daily.     Cholecalciferol 100 MCG (4000 UT) TABS Take 2 tablets by mouth daily.     folic acid  (FOLVITE ) 1 MG tablet Take 1 mg by mouth daily.     naloxone (NARCAN) nasal spray 4 mg/0.1 mL Place 0.4 mg into the nose once.  nicotine  (NICODERM CQ ) 21 mg/24hr patch Place 1 patch (21 mg total) onto the skin daily. 28 patch 2   oxybutynin (DITROPAN-XL) 5 MG 24 hr tablet Take 10 mg by mouth daily as needed (Bladder control).     oxyCODONE -acetaminophen  (PERCOCET) 10-325 MG tablet Take 1 tablet by mouth every 6 (six) hours as needed for pain.     rivaroxaban  (XARELTO ) 20 MG TABS tablet TAKE 1 TABLET BY MOUTH ONCE A DAY WITH SUPPER (Patient taking differently: Take 20 mg by mouth daily with supper.) 30 tablet 2   rosuvastatin  (CRESTOR ) 10 MG tablet Take 1 tablet (10 mg total) by mouth daily. 30  tablet 11   tadalafil (CIALIS) 20 MG tablet Take 20 mg by mouth daily as needed for erectile dysfunction.     tamsulosin  (FLOMAX ) 0.4 MG CAPS capsule Take 1 capsule (0.4 mg total) by mouth daily. 30 capsule 2   No current facility-administered medications for this visit.    Allergies:   Patient has no known allergies.   Social History:  see above  Family History:  see above  PHYSICAL EXAM: VS:  There were no vitals taken for this visit. , BMI There is no height or weight on file to calculate BMI. GEN: Well nourished, well developed, in no acute distress HEENT: normal Neck: no JVD, carotid bruits, or masses Cardiac: ***RRR; no murmurs, rubs, or gallops,no edema  Respiratory:  CTAB bilaterally, normal work of breathing GI: soft, nontender, nondistended, + BS Extremities: No LE edema Skin: warm and dry, no rash Neuro:  Strength and sensation are intact  EKG: ***  Recent Labs: Reviewed  Studies: Reviewed  ASSESSMENT AND PLAN: Randy Andersen is a 68 y.o. male who presents for pre-op.     Signed, Joelle VEAR Ren Donley, MD  05/26/2024 9:14 AM    [facility]

## 2024-05-27 ENCOUNTER — Telehealth: Payer: Self-pay | Admitting: Radiation Oncology

## 2024-05-27 NOTE — Telephone Encounter (Signed)
 Received call from [facility] team to f/u on auth (# ending in [id number]) sent 04/04/24 for continuity of care. Team member was advised last contact was from Randy Andersen 9/15.  Care team advised us  to close referral at this time. If pt does c/b to r/s imaging appt, he will need to contact [facility] to receive [facility] for care again.  Randy Andersen was notified via inbasket of change in case pt reaches out.

## 2024-05-29 ENCOUNTER — Ambulatory Visit: Attending: Cardiovascular Disease

## 2024-05-29 DIAGNOSIS — I2510 Atherosclerotic heart disease of native coronary artery without angina pectoris: Secondary | ICD-10-CM

## 2024-05-29 DIAGNOSIS — E785 Hyperlipidemia, unspecified: Secondary | ICD-10-CM

## 2024-05-29 DIAGNOSIS — I739 Peripheral vascular disease, unspecified: Secondary | ICD-10-CM

## 2024-05-29 DIAGNOSIS — I70221 Atherosclerosis of native arteries of extremities with rest pain, right leg: Secondary | ICD-10-CM

## 2024-05-29 DIAGNOSIS — Z720 Tobacco use: Secondary | ICD-10-CM

## 2024-05-29 DIAGNOSIS — I10 Essential (primary) hypertension: Secondary | ICD-10-CM

## 2024-05-29 DIAGNOSIS — N1832 Chronic kidney disease, stage 3b: Secondary | ICD-10-CM

## 2024-08-01 NOTE — Progress Notes (Unsigned)
 "     Cardiology Office Note Date:  08/01/2024  ID:  Randy Andersen, DOB 12-28-1955, MRN [id number] PCP:  Milissa Savant, MD  Cardiologist:  Joelle VEAR Ren Donley, MD  No chief complaint on file.    Problems Pre-op clearance for R Fem-pop bypass PAD s/p SFA stenting PE Current tobacco use 14pack-year CKD3b [facility] M: AE10, RN20, RN10  Visits  1/26: HA1C, LP, TTE, PET stress test     ROS: Please see the history of present illness. All other systems are reviewed and negative.   Past Medical History:  Diagnosis Date   Asthma    as a child   Chronic pain disorder    LT leg and foot   Heart murmur    as a child   Hypertension    Malignant neoplasm of upper lobe of right lung (HCC)    Stroke (HCC)    mini stroke - 2015, some tingling in fingers on right     Past Surgical History:  Procedure Laterality Date   ABDOMINAL AORTOGRAM W/LOWER EXTREMITY N/A 01/15/2023   Procedure: ABDOMINAL AORTOGRAM W/LOWER EXTREMITY;  Surgeon: Sheree Penne Bruckner, MD;  Location: [facility] INVASIVE CV LAB;  Service: Cardiovascular;  Laterality: N/A;   ABDOMINAL AORTOGRAM W/LOWER EXTREMITY N/A 11/26/2023   Procedure: ABDOMINAL AORTOGRAM W/LOWER EXTREMITY;  Surgeon: Sheree Penne Bruckner, MD;  Location: [facility] INVASIVE CV LAB;  Service: Cardiovascular;  Laterality: N/A;   BUNIONECTOMY Left    had hammer toe surgery also and callus removed   BUNIONECTOMY Right    also had hammer toe surgery and callus removed   LEG SURGERY Left    PT reports he has pins placed in his Lt leg   LOWER EXTREMITY ANGIOGRAPHY N/A 11/26/2023   Procedure: Lower Extremity Angiography;  Surgeon: Sheree Penne Bruckner, MD;  Location: [facility] INVASIVE CV LAB;  Service: Cardiovascular;  Laterality: N/A;   ORIF TIBIA PLATEAU  10/09/2012   Dr Barbarann   ORIF TIBIA PLATEAU Left 10/08/2012   Procedure: OPEN REDUCTION INTERNAL FIXATION (ORIF) TIBIAL PLATEAU;  Surgeon: Oneil JAYSON Barbarann, MD;  Location: [facility] OR;  Service: Orthopedics;  Laterality:  Left;   PERIPHERAL VASCULAR ATHERECTOMY  01/15/2023   Procedure: PERIPHERAL VASCULAR ATHERECTOMY;  Surgeon: Sheree Penne Bruckner, MD;  Location: [facility] INVASIVE CV LAB;  Service: Cardiovascular;;   PERIPHERAL VASCULAR INTERVENTION  01/15/2023   Procedure: PERIPHERAL VASCULAR INTERVENTION;  Surgeon: Sheree Penne Bruckner, MD;  Location: [facility] INVASIVE CV LAB;  Service: Cardiovascular;;   VIDEO BRONCHOSCOPY WITH ENDOBRONCHIAL NAVIGATION N/A 02/25/2020   Procedure: VIDEO BRONCHOSCOPY WITH ENDOBRONCHIAL NAVIGATION with biopsies;  Surgeon: Shelah Lamar RAMAN, MD;  Location: [facility] OR;  Service: Thoracic;  Laterality: N/A;   VIDEO BRONCHOSCOPY WITH ENDOBRONCHIAL ULTRASOUND N/A 02/25/2020   Procedure: VIDEO BRONCHOSCOPY WITH ENDOBRONCHIAL ULTRASOUND;  Surgeon: Shelah Lamar RAMAN, MD;  Location: [facility] OR;  Service: Thoracic;  Laterality: N/A;    Current Outpatient Medications  Medication Sig Dispense Refill   amLODipine  (NORVASC ) 10 MG tablet Take 10 mg by mouth daily.     Cholecalciferol 100 MCG (4000 UT) TABS Take 2 tablets by mouth daily.     folic acid  (FOLVITE ) 1 MG tablet Take 1 mg by mouth daily.     naloxone (NARCAN) nasal spray 4 mg/0.1 mL Place 0.4 mg into the nose once.     nicotine  (NICODERM CQ ) 21 mg/24hr patch Place 1 patch (21 mg total) onto the skin daily. 28 patch 2   oxybutynin (DITROPAN-XL) 5 MG 24 hr tablet Take 10 mg  by mouth daily as needed (Bladder control).     oxyCODONE -acetaminophen  (PERCOCET) 10-325 MG tablet Take 1 tablet by mouth every 6 (six) hours as needed for pain.     rivaroxaban  (XARELTO ) 20 MG TABS tablet TAKE 1 TABLET BY MOUTH ONCE A DAY WITH SUPPER (Patient taking differently: Take 20 mg by mouth daily with supper.) 30 tablet 2   rosuvastatin  (CRESTOR ) 10 MG tablet Take 1 tablet (10 mg total) by mouth daily. 30 tablet 11   tadalafil (CIALIS) 20 MG tablet Take 20 mg by mouth daily as needed for erectile dysfunction.     tamsulosin  (FLOMAX ) 0.4 MG CAPS capsule Take 1 capsule (0.4 mg  total) by mouth daily. 30 capsule 2   No current facility-administered medications for this visit.    Allergies:   Patient has no known allergies.   Social History:  see above  Family History:  see above  PHYSICAL EXAM: VS:  There were no vitals taken for this visit. , BMI There is no height or weight on file to calculate BMI. GEN: Well nourished, well developed, in no acute distress HEENT: normal Neck: no JVD, carotid bruits, or masses Cardiac: ***RRR; no murmurs, rubs, or gallops,no edema  Respiratory:  CTAB bilaterally, normal work of breathing GI: soft, nontender, nondistended, + BS Extremities: No LE edema Skin: warm and dry, no rash Neuro:  Strength and sensation are intact  EKG: ***  Recent Labs: Reviewed  Studies: Reviewed  ASSESSMENT AND PLAN: Randy Andersen is a 69 y.o. male who presents for new visit.    Signed, Joelle VEAR Ren Donley, MD  08/01/2024 10:08 AM    [facility] "

## 2024-08-04 ENCOUNTER — Other Ambulatory Visit (HOSPITAL_COMMUNITY): Payer: Self-pay

## 2024-08-04 ENCOUNTER — Ambulatory Visit

## 2024-08-04 ENCOUNTER — Other Ambulatory Visit: Payer: Self-pay

## 2024-08-04 VITALS — BP 150/74 | HR 80 | Ht 73.0 in | Wt 168.7 lb

## 2024-08-04 DIAGNOSIS — I70221 Atherosclerosis of native arteries of extremities with rest pain, right leg: Secondary | ICD-10-CM | POA: Diagnosis not present

## 2024-08-04 DIAGNOSIS — Z720 Tobacco use: Secondary | ICD-10-CM | POA: Diagnosis not present

## 2024-08-04 DIAGNOSIS — Z01818 Encounter for other preprocedural examination: Secondary | ICD-10-CM | POA: Diagnosis not present

## 2024-08-04 DIAGNOSIS — N1832 Chronic kidney disease, stage 3b: Secondary | ICD-10-CM | POA: Diagnosis not present

## 2024-08-04 DIAGNOSIS — I739 Peripheral vascular disease, unspecified: Secondary | ICD-10-CM

## 2024-08-04 LAB — LIPID PANEL W/O CHOL/HDL RATIO
Cholesterol, Total: 202 mg/dL — ABNORMAL HIGH (ref 100–199)
HDL: 52 mg/dL
LDL Chol Calc (NIH): 125 mg/dL — ABNORMAL HIGH (ref 0–99)
Triglycerides: 141 mg/dL (ref 0–149)
VLDL Cholesterol Cal: 25 mg/dL (ref 5–40)

## 2024-08-04 MED ORDER — OMRON 3 SERIES BP MONITOR DEVI
0 refills | Status: AC
  Filled 2024-08-04: qty 1, 30d supply, fill #0

## 2024-08-04 NOTE — Patient Instructions (Addendum)
 Medication Instructions:  Your physician recommends that you continue on your current medications as directed. Please refer to the Current Medication list given to you today.  *If you need a refill on your cardiac medications before your next appointment, please call your pharmacy*  Lab Work: LIPIDS, A1C, Lipoprotein A-TODAY If you have labs (blood work) drawn today and your tests are completely normal, you will receive your results only by: MyChart Message (if you have MyChart) OR A paper copy in the mail If you have any lab test that is abnormal or we need to change your treatment, we will call you to review the results.  Testing/Procedures: Your physician has requested that you have an echocardiogram. Echocardiography is a painless test that uses sound waves to create images of your heart. It provides your doctor with information about the size and shape of your heart and how well your hearts chambers and valves are working. This procedure takes approximately one hour. There are no restrictions for this procedure. Please do NOT wear cologne, perfume, aftershave, or lotions (deodorant is allowed). Please arrive 15 minutes prior to your appointment time.  Please note: We ask at that you not bring children with you during ultrasound (echo/ vascular) testing. Due to room size and safety concerns, children are not allowed in the ultrasound rooms during exams. Our front office staff cannot provide observation of children in our lobby area while testing is being conducted. An adult accompanying a patient to their appointment will only be allowed in the ultrasound room at the discretion of the ultrasound technician under special circumstances. We apologize for any inconvenience.  Exercise Stress Test  An exercise stress test is a test to check how your heart works during exercise and checks for coronary artery disease. Your heart rate will be watched while you are resting and while you are  exercising.  Patches (electrodes) will be put on your chest. Do not put lotions, powders, creams, or oils on your chest before the test. Wires will be connected to the patches. The wires will send signals to a machine to record your heartbeat. Wear comfortable shoes and clothing. Follow instructions from your doctor about what you cannot eat or drink before the test.  Before the procedure Follow instructions from your doctor about what you cannot eat or drink. Do not have any drinks or foods that have caffeine in them for 24 hours before the test, or as told by your doctor. This includes coffee, tea (even decaf tea), sodas, chocolate, and cocoa. Hold beta-blocker medicines for night before and the morning of. Take all other BP meds that aren't Bbs.  If you use an inhaler, bring it with you to the test. Do not use any products that have nicotine  or tobacco in them, such as cigarettes and e-cigarettes. Stop using them at least 4 hours before the test. If you need help quitting, ask your doctor.   Follow-Up: At [facility] you and your health needs are our priority.  As part of our continuing mission to provide you with exceptional heart care, our providers are all part of one team.  This team includes your primary Cardiologist (physician) and Advanced Practice Providers or APPs (Physician Assistants and Nurse Practitioners) who all work together to provide you with the care you need, when you need it.  Your next appointment:   2 month(s)  Provider:   Dr Ren  We have sent in an order for a blood pressure monitor to our pharmacy downstairs  on the 1st floor. Keep a log of your blood pressure at home and bring it with you to your next office visit with Dr Ren.

## 2024-08-05 LAB — HEMOGLOBIN A1C
Est. average glucose Bld gHb Est-mCnc: 123 mg/dL
Hgb A1c MFr Bld: 5.9 % — ABNORMAL HIGH (ref 4.8–5.6)

## 2024-08-05 LAB — LIPOPROTEIN A (LPA): Lipoprotein (a): 87.6 nmol/L — AB

## 2024-09-08 ENCOUNTER — Ambulatory Visit (HOSPITAL_COMMUNITY)

## 2024-10-02 ENCOUNTER — Ambulatory Visit
